# Patient Record
Sex: Male | Born: 1960 | Race: White | Hispanic: No | Marital: Married | Smoking: Former smoker
Health system: Southern US, Community
[De-identification: ages and names within clinical notes are randomized; demographics above are authoritative.]

## PROBLEM LIST (undated history)

## (undated) DIAGNOSIS — E291 Testicular hypofunction: Secondary | ICD-10-CM

## (undated) DIAGNOSIS — K219 Gastro-esophageal reflux disease without esophagitis: Secondary | ICD-10-CM

## (undated) DIAGNOSIS — Z8619 Personal history of other infectious and parasitic diseases: Secondary | ICD-10-CM

## (undated) DIAGNOSIS — E559 Vitamin D deficiency, unspecified: Secondary | ICD-10-CM

## (undated) DIAGNOSIS — I639 Cerebral infarction, unspecified: Secondary | ICD-10-CM

## (undated) DIAGNOSIS — E1140 Type 2 diabetes mellitus with diabetic neuropathy, unspecified: Secondary | ICD-10-CM

## (undated) DIAGNOSIS — E119 Type 2 diabetes mellitus without complications: Secondary | ICD-10-CM

## (undated) DIAGNOSIS — K5792 Diverticulitis of intestine, part unspecified, without perforation or abscess without bleeding: Secondary | ICD-10-CM

## (undated) DIAGNOSIS — Z87442 Personal history of urinary calculi: Secondary | ICD-10-CM

## (undated) DIAGNOSIS — E785 Hyperlipidemia, unspecified: Secondary | ICD-10-CM

## (undated) DIAGNOSIS — I10 Essential (primary) hypertension: Secondary | ICD-10-CM

## (undated) DIAGNOSIS — M869 Osteomyelitis, unspecified: Secondary | ICD-10-CM

## (undated) HISTORY — DX: Vitamin D deficiency, unspecified: E55.9

## (undated) HISTORY — PX: ORIF TIBIA FRACTURE: SHX5416

## (undated) HISTORY — DX: Osteomyelitis, unspecified: M86.9

## (undated) HISTORY — DX: Essential (primary) hypertension: I10

## (undated) HISTORY — PX: COLONOSCOPY: SHX174

## (undated) HISTORY — DX: Personal history of other infectious and parasitic diseases: Z86.19

## (undated) HISTORY — DX: Testicular hypofunction: E29.1

## (undated) HISTORY — DX: Personal history of urinary calculi: Z87.442

## (undated) HISTORY — DX: Cerebral infarction, unspecified: I63.9

## (undated) HISTORY — DX: Type 2 diabetes mellitus with diabetic neuropathy, unspecified: E11.40

## (undated) HISTORY — DX: Hyperlipidemia, unspecified: E78.5

## (undated) HISTORY — DX: Type 2 diabetes mellitus without complications: E11.9

## (undated) HISTORY — DX: Gastro-esophageal reflux disease without esophagitis: K21.9

---

## 1985-01-21 HISTORY — PX: CHOLECYSTECTOMY: SHX55

## 1997-09-05 ENCOUNTER — Ambulatory Visit (HOSPITAL_COMMUNITY): Admission: RE | Admit: 1997-09-05 | Discharge: 1997-09-05 | Payer: Self-pay | Admitting: Internal Medicine

## 1998-11-06 ENCOUNTER — Encounter: Payer: Self-pay | Admitting: Internal Medicine

## 1998-11-06 ENCOUNTER — Ambulatory Visit (HOSPITAL_COMMUNITY): Admission: RE | Admit: 1998-11-06 | Discharge: 1998-11-06 | Payer: Self-pay | Admitting: Internal Medicine

## 1999-05-08 ENCOUNTER — Encounter: Payer: Self-pay | Admitting: Internal Medicine

## 1999-05-08 ENCOUNTER — Ambulatory Visit (HOSPITAL_COMMUNITY): Admission: RE | Admit: 1999-05-08 | Discharge: 1999-05-08 | Payer: Self-pay | Admitting: Internal Medicine

## 2003-09-07 ENCOUNTER — Ambulatory Visit (HOSPITAL_COMMUNITY): Admission: RE | Admit: 2003-09-07 | Discharge: 2003-09-07 | Payer: Self-pay | Admitting: Internal Medicine

## 2004-04-24 ENCOUNTER — Ambulatory Visit: Payer: Self-pay | Admitting: Gastroenterology

## 2004-05-09 ENCOUNTER — Ambulatory Visit: Payer: Self-pay

## 2004-05-14 ENCOUNTER — Ambulatory Visit: Payer: Self-pay

## 2005-11-12 ENCOUNTER — Ambulatory Visit (HOSPITAL_COMMUNITY): Admission: RE | Admit: 2005-11-12 | Discharge: 2005-11-12 | Payer: Self-pay | Admitting: Internal Medicine

## 2006-12-25 ENCOUNTER — Ambulatory Visit (HOSPITAL_COMMUNITY): Admission: RE | Admit: 2006-12-25 | Discharge: 2006-12-25 | Payer: Self-pay | Admitting: Internal Medicine

## 2008-04-28 ENCOUNTER — Encounter: Admission: RE | Admit: 2008-04-28 | Discharge: 2008-04-28 | Payer: Self-pay | Admitting: Internal Medicine

## 2009-09-08 ENCOUNTER — Observation Stay (HOSPITAL_COMMUNITY): Admission: EM | Admit: 2009-09-08 | Discharge: 2009-09-11 | Payer: Self-pay | Admitting: Emergency Medicine

## 2010-03-27 ENCOUNTER — Other Ambulatory Visit (HOSPITAL_COMMUNITY): Payer: Self-pay | Admitting: Internal Medicine

## 2010-03-27 ENCOUNTER — Ambulatory Visit (HOSPITAL_COMMUNITY)
Admission: RE | Admit: 2010-03-27 | Discharge: 2010-03-27 | Disposition: A | Discharge status: Home / Self Care | Payer: 59 | Source: Ambulatory Visit | Attending: Internal Medicine | Admitting: Internal Medicine

## 2010-03-27 DIAGNOSIS — I10 Essential (primary) hypertension: Secondary | ICD-10-CM

## 2010-03-27 DIAGNOSIS — F17200 Nicotine dependence, unspecified, uncomplicated: Secondary | ICD-10-CM | POA: Insufficient documentation

## 2010-03-27 DIAGNOSIS — Z0000 Encounter for general adult medical examination without abnormal findings: Secondary | ICD-10-CM | POA: Insufficient documentation

## 2010-04-06 LAB — GLUCOSE, CAPILLARY
Glucose-Capillary: 108 mg/dL — ABNORMAL HIGH (ref 70–99)
Glucose-Capillary: 119 mg/dL — ABNORMAL HIGH (ref 70–99)
Glucose-Capillary: 120 mg/dL — ABNORMAL HIGH (ref 70–99)
Glucose-Capillary: 124 mg/dL — ABNORMAL HIGH (ref 70–99)
Glucose-Capillary: 134 mg/dL — ABNORMAL HIGH (ref 70–99)
Glucose-Capillary: 159 mg/dL — ABNORMAL HIGH (ref 70–99)
Glucose-Capillary: 162 mg/dL — ABNORMAL HIGH (ref 70–99)
Glucose-Capillary: 193 mg/dL — ABNORMAL HIGH (ref 70–99)
Glucose-Capillary: 201 mg/dL — ABNORMAL HIGH (ref 70–99)

## 2010-04-06 LAB — COMPREHENSIVE METABOLIC PANEL
AST: 121 U/L — ABNORMAL HIGH (ref 0–37)
AST: 55 U/L — ABNORMAL HIGH (ref 0–37)
Albumin: 2.9 g/dL — ABNORMAL LOW (ref 3.5–5.2)
Albumin: 3.8 g/dL (ref 3.5–5.2)
Alkaline Phosphatase: 75 U/L (ref 39–117)
Alkaline Phosphatase: 79 U/L (ref 39–117)
BUN: 14 mg/dL (ref 6–23)
BUN: 18 mg/dL (ref 6–23)
BUN: 22 mg/dL (ref 6–23)
BUN: 9 mg/dL (ref 6–23)
CO2: 26 mEq/L (ref 19–32)
CO2: 28 mEq/L (ref 19–32)
Calcium: 8.6 mg/dL (ref 8.4–10.5)
Chloride: 101 mEq/L (ref 96–112)
Chloride: 103 mEq/L (ref 96–112)
Creatinine, Ser: 0.72 mg/dL (ref 0.4–1.5)
Creatinine, Ser: 0.81 mg/dL (ref 0.4–1.5)
GFR calc Af Amer: >60 mL/min (ref >60)
GFR calc Af Amer: >60 mL/min (ref >60)
GFR calc non Af Amer: >60 mL/min (ref >60)
GFR calc non Af Amer: >60 mL/min (ref >60)
Glucose, Bld: 145 mg/dL — ABNORMAL HIGH (ref 70–99)
Potassium: 3.9 mEq/L (ref 3.5–5.1)
Potassium: 4.4 mEq/L (ref 3.5–5.1)
Sodium: 135 mEq/L (ref 135–145)
Total Bilirubin: 1.1 mg/dL (ref 0.3–1.2)
Total Bilirubin: 2 mg/dL — ABNORMAL HIGH (ref 0.3–1.2)
Total Protein: 6.6 g/dL (ref 6.0–8.3)
Total Protein: 7.8 g/dL (ref 6.0–8.3)

## 2010-04-06 LAB — CBC
HCT: 40.5 % (ref 39.0–52.0)
HCT: 40.6 % (ref 39.0–52.0)
Hemoglobin: 13.8 g/dL (ref 13.0–17.0)
Hemoglobin: 14 g/dL (ref 13.0–17.0)
MCH: 32.3 pg (ref 26.0–34.0)
MCH: 32.3 pg (ref 26.0–34.0)
MCV: 93.5 fL (ref 78.0–100.0)
MCV: 94.1 fL (ref 78.0–100.0)
MCV: 95.1 fL (ref 78.0–100.0)
Platelets: 132 10*3/uL — ABNORMAL LOW (ref 150–400)
Platelets: 186 10*3/uL (ref 150–400)
RBC: 4.27 MIL/uL (ref 4.22–5.81)
RBC: 4.33 MIL/uL (ref 4.22–5.81)
RBC: 4.71 MIL/uL (ref 4.22–5.81)
RDW: 13.1 % (ref 11.5–15.5)
WBC: 14.7 10*3/uL — ABNORMAL HIGH (ref 4.0–10.5)

## 2010-04-06 LAB — DIFFERENTIAL
Basophils Relative: 0 % (ref 0–1)
Eosinophils Relative: 1 % (ref 0–5)
Lymphocytes Relative: 31 % (ref 12–46)
Monocytes Absolute: 1.6 10*3/uL — ABNORMAL HIGH (ref 0.1–1.0)
Monocytes Relative: 11 % (ref 3–12)
Neutro Abs: 8.4 10*3/uL — ABNORMAL HIGH (ref 1.7–7.7)

## 2010-04-06 LAB — URINALYSIS, ROUTINE W REFLEX MICROSCOPIC
Protein, ur: 30 mg/dL — AB
Urobilinogen, UA: 1 mg/dL (ref 0.0–1.0)

## 2010-04-06 LAB — URINE MICROSCOPIC-ADD ON

## 2010-04-06 LAB — HEPATITIS PANEL, ACUTE
HCV Ab: REACTIVE — AB
Hep A IgM: NEGATIVE

## 2010-04-06 LAB — POCT CARDIAC MARKERS
CKMB, poc: <1 ng/mL — ABNORMAL LOW (ref 1.0–8.0)
Myoglobin, poc: 87.9 ng/mL (ref 12–200)

## 2010-06-28 ENCOUNTER — Other Ambulatory Visit: Payer: Self-pay | Admitting: Internal Medicine

## 2010-07-02 ENCOUNTER — Other Ambulatory Visit: Payer: 59

## 2010-07-19 ENCOUNTER — Ambulatory Visit (INDEPENDENT_AMBULATORY_CARE_PROVIDER_SITE_OTHER): Payer: 59 | Admitting: Gastroenterology

## 2010-07-19 ENCOUNTER — Other Ambulatory Visit: Payer: Self-pay | Admitting: Gastroenterology

## 2010-07-19 VITALS — BP 133/75 | HR 74 | Temp 97.7°F | Ht 74.0 in | Wt 296.0 lb

## 2010-07-19 DIAGNOSIS — B182 Chronic viral hepatitis C: Secondary | ICD-10-CM

## 2010-07-20 LAB — AFP TUMOR MARKER: AFP-Tumor Marker: 9.2 ng/mL — ABNORMAL HIGH (ref 0.0–8.0)

## 2010-07-20 LAB — CBC WITH DIFFERENTIAL/PLATELET
Basophils Absolute: 0.1 10*3/uL (ref 0.0–0.1)
Basophils Relative: 1 % (ref 0–1)
Eosinophils Absolute: 0.1 10*3/uL (ref 0.0–0.7)
Eosinophils Relative: 1 % (ref 0–5)
HCT: 46.4 % (ref 39.0–52.0)
Hemoglobin: 15.9 g/dL (ref 13.0–17.0)
MCH: 33.3 pg (ref 26.0–34.0)
MCHC: 34.3 g/dL (ref 30.0–36.0)
MCV: 97.1 fL (ref 78.0–100.0)
Monocytes Absolute: 0.9 10*3/uL (ref 0.1–1.0)
Monocytes Relative: 10 % (ref 3–12)
RDW: 13.5 % (ref 11.5–15.5)

## 2010-07-20 LAB — IRON AND TIBC
%SAT: 57 % — ABNORMAL HIGH (ref 20–55)
Iron: 200 ug/dL — ABNORMAL HIGH (ref 42–165)

## 2010-07-20 LAB — HEPATITIS A ANTIBODY, TOTAL: Hep A Total Ab: POSITIVE — AB

## 2010-07-20 LAB — ANA: Anti Nuclear Antibody(ANA): NEGATIVE

## 2010-07-26 NOTE — Progress Notes (Signed)
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   History:  The patient is a 50 year old gentleman who I have been asked to see in consultation by Dr. 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  It will be recalled that I first saw the patient on 04/24/2004, regarding his hepatitis C. At the time we discovered, he was genotype 2. On 06/12/2004, I discussed the results of the lab testing from his  initial visit with him. We discussed treatment at that time. He wanted to discuss this with his significant other and contact us with his decision. He never called [place] back. At this point, he reports that Dr.  Oneta Rack has sent him back to see [place] again, because he thinks his liver tests are somewhat more elevated although I do not have any documentation as to if there is a specific concern. There are  currently no symptoms referable to his history of genotype 2 hepatitis C. There are no symptoms to suggest cryoglobulin mediated or decompensated liver disease.  With respect to risk factors for liver disease, he drinks approximately 1-2 drinks per year since last being seen. He smokes marijuana on 1-2 occasions per week. He denies any history of intravenous or intranasal drug use since last being seen, but over 25 years ago he used drugs intravenously. There is a history of tattoos but no history of transfusion or unsterile body piercing.  There is no family history of liver disease. He was previously found to be hepatitis B immune, but A nave and did not get vaccinated.    PAST MEDICAL HISTORY:  Significant for hypertriglyceridemia and hypertension. He also now has a history of type 2 diabetes.  He measures his blood sugars only 2-3 times a week when he feels unwell, thinking his blood sugars may be  low. In a handwritten note from 09/27/2009, hemoglobin A1c around that time was 7.4% and possibly 8 on 08/07/2009, and around mid August in hospital for diverticulitis, it was 8.2%. He also was hospitalized  between 09/07/2009 and 09/11/2009, for an episode of "diverticulitis."  CT abdomen and pelvis with contrast during this admission on  09/07/2009, showed the liver was unremarkable in appearance, and there was no evidence of portal hypertension.   PAST SURGICAL HISTORY:  There has been no surgery since last being seen in 2006 but is significant for cholecystectomy 1988, and 3 surgeries for a fractured leg.   Past psychiatric history:  He denies.   CURRENT MEDICATIONS:  Hydrochlorothiazide 25 mg p.o. daily, glyburide 10 mg p.o. daily, metformin 1000 mg p.o. b.i.d., Benicar 20 mg p.o. daily, citalopram 40  mg p.o. daily, fish oil 1000 mg b.i.d., vitamin D three 10,000 units daily, milk thistle 1000 mg daily.    allergies:  Denies.   habits:  Smoking quit recently.  Alcohol as above.   FAMILY HISTORY:  As above.   SOCIAL HISTORY:  He is a Retail banker. He has a common-law wife with no children.   REVIEW OF SYSTEMS:  All 10 systems reviewed today with the patient on the review of systems form, which was signed and placed in the chart. CES-D was  12.   PHYSICAL EXAMINATION:   Constitutional:  Central obesity, but otherwise well appearing. Vital signs: Height 74 inches, weight 296 pounds, which is up from 267 pounds previously on 04/24/2004, blood pressure 133/75, pulse of 74,  temperature 97.7 Fahrenheit.  Ears, nose, mouth and throat:  Unremarkable oropharynx.  No thyromegaly or neck masses.  Chest:  Resonant to percussion.  Clear to auscultation.  Cardiovascular:   Heart sounds normal S1, S2 without murmurs or rubs.  There is no peripheral edema.  Abdominal:  Normal  bowel sounds.  No masses or tenderness.  I could not appreciate a liver edge or spleen tip.  I  could not appreciate any hernias.  Lymphatics:  No cervical or inguinal lymphadenopathy.  Central Nervous System:  No asterixis or focal neurologic findings.  Dermatologic:  Anicteric without palmar  erythema or spider angiomata.  Eyes:  Anicteric sclerae.  Pupils are equal and reactive to light.   Laboratories:  Most recent labs 03/27/2010; Hemoglobin A1c was 9.6%, hepatitis C RNA was 3,090,000 international units per mL. His genotype was not reported. CBC was unremarkable with a platelet count of 179,  creatinine was 0.77, triglycerides were 388.  His total bilirubin 0.6, albumin was 4.2, AST 57, ALT 99, ALP 73, globulins 2.9, TSH 2.07.   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  A biopsy was not previously  indicated. My concerns over treating him are that of his poorly- controlled diabetes and his hypertriglyceridemia. These are not absolute contraindication to treating him, however.  I discussed treatment with pegylated interferon and ribavirin with him. I have explained our treatment protocol and the response rate for this. We discussed the fact that the interferon could worsen  diabetes control, which in turn can impact on his response rates to treatment. I reviewed the specific system, constitutional, psychiatric side effects of interferon and ribavirin.  The patient was interested in pursuing treatment.   PLAN:  1. Will recheck his genotype to be certain it has not changed. 2. Will check his hepatitis A antibody as previously he was negative but had not been immunized. 3. Otherwise, standard labs. 4. I will start the prior authorization process for his insurance with [employer]. 5. 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            Brooke Dare, MD    ADDENDUM: Hep A immune.  Previously ferritin and iron saturation not significant but now 1037 and 57% respectively.  Will check HFE gene and if negative but ferritin still over 1000, may need a biopsy with HII.  Genotype 2.  I have initiated the prior authorization process with [employer]  Case ID for Pegasys will be [id number] and for  Ribavirin will [id number].  [phone]  D:  Thu Jun 28 18:40:52 2012 ; T:  Thu Jun 28 21:21:27 2012  Job #:  [id number]

## 2010-08-30 ENCOUNTER — Telehealth: Payer: Self-pay | Admitting: Gastroenterology

## 2010-08-30 NOTE — Telephone Encounter (Signed)
I called [facility] at [phone]) and confirmed that they have filled his Peg/RBV and have contacted the patient to ship meds but he has not replied.  I called [name] and left a message asking him to contact [facility] and [place] to receive his meds and arrange for a follow up appt.

## 2010-10-04 ENCOUNTER — Ambulatory Visit (INDEPENDENT_AMBULATORY_CARE_PROVIDER_SITE_OTHER): Payer: 59 | Admitting: Gastroenterology

## 2010-10-04 VITALS — BP 150/82 | HR 76 | Temp 98.2°F | Ht 74.0 in | Wt 299.0 lb

## 2010-10-04 DIAGNOSIS — B182 Chronic viral hepatitis C: Secondary | ICD-10-CM

## 2010-10-05 LAB — HEPATITIS C RNA QUANTITATIVE
HCV Quantitative Log: 6.47 {Log} — ABNORMAL HIGH (ref <1.63)
HCV Quantitative: 2920000 IU/mL — ABNORMAL HIGH (ref <43)

## 2010-10-18 ENCOUNTER — Ambulatory Visit (INDEPENDENT_AMBULATORY_CARE_PROVIDER_SITE_OTHER): Payer: 59 | Admitting: Gastroenterology

## 2010-10-18 DIAGNOSIS — B182 Chronic viral hepatitis C: Secondary | ICD-10-CM

## 2010-10-18 NOTE — Progress Notes (Signed)
   NAME:  [name], [name]  MR#:  [id number]      DATE:  10/04/2010  DOB:  08-Aug-1960    cc: Referring Physician:  Marinus Maw, MD, [facility], [address], [address], [place], [place] [address], Fax [phone]    REASON FOR VISIT:  Follow up of genotype 2 hepatitis C prior to commencement of treatment.    History:  The patient returns today unaccompanied. Since last being seen on 07/19/2010, he has taken delivery of his interferon and ribavirin to start on treatment. He intends to start tomorrow with his first  injection of interferon and dose of ribavirin. There have been no interval development of symptoms or symptoms to suggest cryoglobulin mediated or decompensated liver disease.   PAST MEDICAL HISTORY:  Significant for type 2 diabetes.   LABORATORY STUDIES:  From 10/02/2010, show hemoglobin A1c of 9.3%. In addition, he has a history of hypertriglyceridemia and the triglycerides were 487 on 10/02/2010.   CURRENT MEDICATIONS:  Hydrochlorothiazide 25 mg p.o. daily, glyburide 10 mg p.o. daily, metformin 1000 mg p.o. b.i.d., Benicar 20 mg p.o. daily, citalopram 40  mg p.o. daily, fish oil 1000 mg p.o. b.i.d., vitamin D 10,000 units daily, milk thistle 1000 mg p.o. daily.    Allergies:  Denies.    HABITS:  Smoking, recently resumed smoking. Alcohol, denies interval consumption.   REVIEW OF SYSTEMS:  All 10 systems reviewed today on with the patient and they are negative other than which was mentioned above. CES-D was 11.   PHYSICAL EXAMINATION:   Constitutional:  Well appearing but does have central obesity. Vital signs: Height 74 inches, weight 299 pounds up 3 pounds from  previously. Blood pressure 150/82, pulse 76, temperature 98.2 Fahrenheit.   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 I do not think a biopsy is necessary as part of his  pretreatment. Although his blood sugars have been  running high of late as evidenced by his hemoglobin A1c and his triglycerides are elevated, I think it is reasonable to give him an attempt at therapy rather than waiting any longer.  Today, I showed the patient how to do a self injection of interferon. We discussed dosing regimen and mitigated some side effects. I also discussed dosing of ribavirin. I have discussed the interaction  between poorly-controlled diabetes and interferon and how this may worsen his diabetes, as well as diminish his response rates. The  patient already has tried, in response to his last A1c, has started to implement changes to diet to improve his A1c.   PLAN:  1. Baseline HCV RNA today. 2. He is hepatitis A immune. 3. Previously reported to be hepatitis B immune. Nevertheless, will check total B core antibody and hepatitis B surface antibody today. 4. His start date for treatment will be 10/05/2010. 5. He is to return in 2 weeks' time for his week 2 visit. At that time we will draw an HFE gene as previously documented in his 07/19/2010 notes. 6. I drew his baseline HCV RNA today.            Brooke Dare, MD   [phone]  D:  Thu Sep 13 13:16:10 2012 ; T:  Thu Sep 13 13:53:43 2012  Job #:  [id number]

## 2010-10-19 LAB — CBC WITH DIFFERENTIAL/PLATELET
Basophils Absolute: 0 10*3/uL (ref 0.0–0.1)
Eosinophils Absolute: 0 10*3/uL (ref 0.0–0.7)
Eosinophils Relative: 1 % (ref 0–5)
HCT: 38.2 % — ABNORMAL LOW (ref 39.0–52.0)
Lymphocytes Relative: 59 % — ABNORMAL HIGH (ref 12–46)
Lymphs Abs: 3.1 10*3/uL (ref 0.7–4.0)
MCH: 33.3 pg (ref 26.0–34.0)
MCV: 97.9 fL (ref 78.0–100.0)
Monocytes Absolute: 0.7 10*3/uL (ref 0.1–1.0)
Platelets: 143 10*3/uL — ABNORMAL LOW (ref 150–400)
RDW: 14 % (ref 11.5–15.5)
WBC: 5.3 10*3/uL (ref 4.0–10.5)

## 2010-10-25 NOTE — Progress Notes (Signed)
   NAME:  [name], [name]  MR#:  [id number]      DATE:  10/18/2010  DOB:  [birth year]    cc: Referring Physician:  Marinus Maw, MD, [facility], [address], [place], [place] [address], Fax [phone]    REASON FOR VISIT:  Followup of genotype 2 hepatitis C at week 2 of therapy.    History:  The patient returns today unaccompanied. His start date for treatment was 10/05/2010. He will take his third dose of pegylated interferon tomorrow on 10/19/2010. After his first injection he had some nausea  and thought he had some viral infection like symptoms. However this dissipated within the first week. After second injection of  interferon, he has felt well. He has no skin rash either. There is no significant irritability.   PAST MEDICAL HISTORY:  Significant for type 2 diabetes. He notes he has lost 3 pounds since last being seen by me and reports his diabetes is better. For example,  he reports today's 2 hour post breakfast blood sugar was 170, whereas typically it was over 200 in the past.   CURRENT MEDICATIONS:  Hydrochlorothiazide 25 mg p.o. daily, glyburide 10 mg p.o. daily, metformin 1000 mg p.o. b.i.d., Benicar 20 mg p.o. daily, citalopram 40 mg p.o. daily, fish oil 1000 mg p.o. b.i.d., vitamin D 10,000 units  p.o. daily, milk thistle 1000 mg p.o. daily, Pegasys 180 mcg subcu weekly, ribavirin 400 mg p.o. b.i.d.    Allergies:  Denies.   Habits:  Smoking still approximately a pack cigarettes per day but thinks he may reduce his smoking because he has lost the taste for this on interferon. Alcohol, denies interval consumption.   REVIEW OF SYSTEMS:  All 10 systems reviewed today with the patient and they are negative other than which was mentioned above. CES-D was 6.   PHYSICAL EXAMINATION:   Constitutional:  Well appearing with central obesity. Vital signs: Height 74 inches, weight 296 pounds, blood pressure  135/77, pulse of 81, temperature 97.8 Fahrenheit.   Laboratories:  His pretreatment viral load was 2,920,000 international units per mL. He was hepatitis B surface antibody positive, as well as previously total A antibody positive.   ASSESSMENT:  The patient is a 50 year old gentleman with history of genotype 2 hepatitis C. He is currently week 2 of therapy having started on 10/05/2010. He is tolerating this well.  In my discussion today with the patient, we discussed his progress to date. I have explained the significance of the lab testing especially today and his week 4 HCV RNA in 2 weeks' time.   plan:  1. Hepatitis A and B immune. 2. CBC today. 3. Return in 2 weeks' time for week 4 HCV RNA and a CBC and liver enzymes.            Brooke Dare, MD   [phone]  D:  Thu Sep 27 17:59:02 2012 ; T:  Thu Sep 27 18:52:13 2012  Job #:  [id number]

## 2010-11-01 ENCOUNTER — Emergency Department (HOSPITAL_COMMUNITY)
Admission: EM | Admit: 2010-11-01 | Discharge: 2010-11-01 | Disposition: A | Discharge status: Home / Self Care | Payer: 59 | Attending: Emergency Medicine | Admitting: Emergency Medicine

## 2010-11-01 ENCOUNTER — Ambulatory Visit (INDEPENDENT_AMBULATORY_CARE_PROVIDER_SITE_OTHER): Payer: 59 | Admitting: Gastroenterology

## 2010-11-01 ENCOUNTER — Emergency Department (HOSPITAL_COMMUNITY): Payer: 59

## 2010-11-01 DIAGNOSIS — R109 Unspecified abdominal pain: Secondary | ICD-10-CM | POA: Insufficient documentation

## 2010-11-01 DIAGNOSIS — R112 Nausea with vomiting, unspecified: Secondary | ICD-10-CM | POA: Insufficient documentation

## 2010-11-01 DIAGNOSIS — B182 Chronic viral hepatitis C: Secondary | ICD-10-CM

## 2010-11-01 DIAGNOSIS — E119 Type 2 diabetes mellitus without complications: Secondary | ICD-10-CM | POA: Insufficient documentation

## 2010-11-01 DIAGNOSIS — I10 Essential (primary) hypertension: Secondary | ICD-10-CM | POA: Insufficient documentation

## 2010-11-01 DIAGNOSIS — N1330 Unspecified hydronephrosis: Secondary | ICD-10-CM | POA: Insufficient documentation

## 2010-11-01 DIAGNOSIS — Z79899 Other long term (current) drug therapy: Secondary | ICD-10-CM | POA: Insufficient documentation

## 2010-11-01 LAB — CBC
HCT: 36 % — ABNORMAL LOW (ref 39.0–52.0)
Hemoglobin: 12.5 g/dL — ABNORMAL LOW (ref 13.0–17.0)
MCH: 32.9 pg (ref 26.0–34.0)
MCHC: 34.7 g/dL (ref 30.0–36.0)
MCV: 94.7 fL (ref 78.0–100.0)
RDW: 14.9 % (ref 11.5–15.5)

## 2010-11-01 LAB — DIFFERENTIAL
Eosinophils Relative: 0 % (ref 0–5)
Lymphocytes Relative: 24 % (ref 12–46)
Lymphs Abs: 1.6 10*3/uL (ref 0.7–4.0)
Monocytes Absolute: 0.4 10*3/uL (ref 0.1–1.0)
Monocytes Relative: 7 % (ref 3–12)
Neutro Abs: 4.7 10*3/uL (ref 1.7–7.7)

## 2010-11-01 LAB — URINALYSIS, ROUTINE W REFLEX MICROSCOPIC
Glucose, UA: NEGATIVE mg/dL
Ketones, ur: NEGATIVE mg/dL
Protein, ur: NEGATIVE mg/dL

## 2010-11-01 LAB — BASIC METABOLIC PANEL
BUN: 29 mg/dL — ABNORMAL HIGH (ref 6–23)
CO2: 23 mEq/L (ref 19–32)
Glucose, Bld: 132 mg/dL — ABNORMAL HIGH (ref 70–99)
Potassium: 4.2 mEq/L (ref 3.5–5.1)
Sodium: 134 mEq/L — ABNORMAL LOW (ref 135–145)

## 2010-11-01 LAB — URINE MICROSCOPIC-ADD ON

## 2010-11-09 NOTE — Progress Notes (Signed)
NAME:  [name], [name]  MR#:  [id number]      DATE:  11/01/2010  DOB:  1960/03/23    cc: Referring Physician:  Marinus Maw, MD, [facility], [address], [place], [place] [address], Fax [phone]    REASON FOR VISIT:  Follow up of genotype 2 hepatitis C at week 4 of therapy.   History:  The patient returns today unaccompanied. His start date for treatment was 10/05/2010. He will take is fifth dose tomorrow on 11/02/2010. He is tolerating therapy quite well.  However, today his major complaint is that of the development of left flank pain. He woke up this morning at 3 a.m. with left flank pain and it has persisted over the course of the day. This radiated down to the left groin. There is associated nausea and anorexia but no fever. There is no hematuria or change with bowel movements. He took one 7.5 mg Percocet this morning with temporary relief but currently he is having significant pain. He has no history of nephrolithiasis.   PAST MEDICAL HISTORY:  Significant for type 2 diabetes. He reports he was able to stop his glyburide because of episodes of hypoglycemia. He has lost 6 pounds since his last clinic appointment.   CURRENT MEDICATIONS:  Hydrochlorothiazide 25 mg p.o. daily, metformin 1000 mg p.o. b.i.d., Benicar 20 mg p.o. daily, citalopram 40 mg p.o. daily, fish oil 1000  mg p.o. b.i.d., vitamin D 10,000 units p.o. daily, Pegasys 180 mcg subcu weekly, ribavirin 400 mg p.o. b.i.d.   ALLERGIES:  Denies.   HABITS:  Still smoking approximately a pack of cigarettes per day. Alcohol denies interval consumption.   REVIEW OF SYSTEMS:  All 10 systems reviewed today with the patient and they are significant for the left flank pain as previously mentioned. Otherwise negative. His CES-D was 14.   PHYSICAL EXAMINATION:   Constitutional:  Appeared in moderate distress. There is central obesity. Vital signs: Height 74  inches, weight 290 pounds, blood pressure 133/88, pulse of 88, temperature 97.8 Fahrenheit.  Ears,  nose, mouth, and throat: Unremarkable oropharynx. No thyromegaly or neck masses. Chest: Resonant to percussion. Clear to auscultation.  Cardiovascular:  Normal S1, S2 without murmurs or rubs. There is no peripheral edema.  Abdominal:  Normal bowel sounds. There are no masses. There is tenderness in left lower quadrant with no rebound or guarding. The tenderness is most pronounced towards the left flank. I could not  appreciate liver edge or spleen tip. There was left CVA tenderness. Lymphatics: No cervical or inguinal lymphadenopathy. Central nervous system: No asterixis or focal findings.  Dermatologic:  Anicteric. No palmar erythema.   Eyes:  Anicteric sclera. Pupils equal, active to light.   ASSESSMENT:  A 50 year old gentleman with a history of genotype 2 hepatitis C who is currently completed 4 weeks of therapy with his fifth week of interferon and ribavirin to start tomorrow. His pretreatment viral  load was 2,920,000 international units per mL. He is due for his week 4 HCV RNA today.   However, it appears that he has developed left renal colic possibly from a left renal stone. I think he would best be  managed in the emergency room for this. I do not think this is related to his hepatitis C therapy and he could continue therapy through whatever happens to him.  Today, I discussed it is possible that the patient has left renal colic and I advised him to  go to the emergency room. To that end, I also called the ER and spoke to the ER physician at [facility]  regarding him. I have asked the patient to be sure that they drawn an HCV RNA if they are drawing any lab work tonight.   Plan:  1. Hepatitis A and B immune. 2. CBC will be done in the emergency room as part of his labs. 3. His week 4 HCV RNA is due today. If he does not get this done in the ER tonight, I have given him a requisition to  take to [facility]. 4. He is to return in 2 weeks' time for his week 6 visit.            Brooke Dare, MD   ADDENDUM Week 4 HCV RNA 104 IU/mL  pretreatment viral  load was 2,920,000 international units per mL  [phone]  D:  Thu Oct 11 18:54:33 2012 ; T:  Thu Oct 11 20:36:24 2012  Job #:  [id number]

## 2010-11-15 ENCOUNTER — Ambulatory Visit (INDEPENDENT_AMBULATORY_CARE_PROVIDER_SITE_OTHER): Payer: 59 | Admitting: Gastroenterology

## 2010-11-15 DIAGNOSIS — B182 Chronic viral hepatitis C: Secondary | ICD-10-CM

## 2010-11-16 LAB — CBC WITH DIFFERENTIAL/PLATELET
Basophils Absolute: 0 10*3/uL (ref 0.0–0.1)
Eosinophils Absolute: 0 10*3/uL (ref 0.0–0.7)
Eosinophils Relative: 0 % (ref 0–5)
Lymphocytes Relative: 49 % — ABNORMAL HIGH (ref 12–46)
MCV: 102.7 fL — ABNORMAL HIGH (ref 78.0–100.0)
Neutrophils Relative %: 39 % — ABNORMAL LOW (ref 43–77)
Platelets: 141 10*3/uL — ABNORMAL LOW (ref 150–400)
RDW: 15.8 % — ABNORMAL HIGH (ref 11.5–15.5)
WBC: 5.5 10*3/uL (ref 4.0–10.5)

## 2010-11-19 LAB — HEPATITIS C RNA QUANTITATIVE

## 2010-11-22 NOTE — Progress Notes (Signed)
NAME:  [name], [name]  MR#:  [id number]      DATE:  11/15/2010  DOB:  May 07, 1960    cc: Referring Physician:  Andria Meuse, MD, [facility], [address], [place], [place] [address], Fax [phone]    REASON FOR VISIT:  Followup genotype 2 HCV at week 6 of therapy.   History:  The patient returns today unaccompanied. His start date for therapy was 10/05/2010. He will take the sixth dose of interferon tomorrow on 11/16/2010. He is tolerating therapy quite well. There are no  significant depressive symptoms. He complains of some mild nausea but is not troubled by it. There is no rash.   Past medical history:  It will be recalled that when he was seen in clinic on 11/01/2010, he had significant left flank pain. I sent him to the emergency room. He was also diagnosed with a kidney stone.  The report of the CT scan  indicated that the stone had passed. Although he was given followup appoint with Urology, he does not see the need to go because he is now feeling very well.  Otherwise, he is a type 2 diabetic.   CURRENT MEDICATIONS:  Hydrochlorothiazide 25 mg p.o. daily, metformin 1000 mg p.o. b.i.d., Benicar 20 mg p.o. daily, citalopram 40 mg p.o. daily, fish oil 1000  mg p.o. b.i.d., vitamin D 10,000 units p.o. daily, Pegasys 180 mcg subcu weekly, ribavirin 400 mg p.o. b.i.d.   ALLERGIES:  Denies.   HABITS:  Still smokes approximately half pack of cigarettes per day. Alcohol, denies interval alcohol consumption.   REVIEW OF SYSTEMS:  All 10 systems reviewed today with the patient and they are negative other than which was mentioned above. CES-D was 11.   PHYSICAL EXAMINATION:   Constitutional:  Well appearing with central obesity. Vital signs: Height 74 inches, weight 257 pounds, which is down 3 pounds from  previous, blood pressure 155/86, pulse of 80, temperature 98 Fahrenheit.   LABORATORY STUDIES:  From 11/01/2010,  white count 6.7, hemoglobin 12.5, MCV 94.7, platelet count 138, ANC 4.7.   ASSESSMENT:  The patient is a 50 year old gentleman with history of genotype 2 hepatitis C, who completed 5 weeks of therapy with the sixth week of interferon and ribavirin to start tomorrow. His pretreatment viral load was 2,920,000 international units per mL. Week 4 HCV RNA had fallen to 104 international units per mL. The value of extending therapy from 24 to 48 weeks in a patient who did not achieve a complete rapid virologic response is unknown. In addition if he is not tolerating therapy well, it would not be worth continuing. Finally, if his insurer will not approve longer duration of therapy there will be little I could probably do about that. Finally with the anticipation of new medications to treat hepatitis C beyond genotype 1 patients, he may stand to benefit even if he did not respond to treatment now.  In my discussion today with the patient, we discussed his week 4 HCV RNA and its implications. We discussed possibly needing to extend therapy versus not.   PLAN:  1. He is hepatitis A and B immune. 2. CBC today, as well as HCV RNA for week 6. 3. To return in 2 weeks' time for his week 8 visit.            Brooke Dare, MD   ADDENUDM:  HCV RNA undetectable at week 6.  [phone]  D:  Thu Oct 25 16:10:59 2012 ; T:  Thu Oct 25 17:52:54 2012  Job #:  [id number]

## 2010-11-29 ENCOUNTER — Ambulatory Visit (INDEPENDENT_AMBULATORY_CARE_PROVIDER_SITE_OTHER): Payer: 59 | Admitting: Gastroenterology

## 2010-11-29 DIAGNOSIS — B182 Chronic viral hepatitis C: Secondary | ICD-10-CM

## 2010-11-30 LAB — CBC WITH DIFFERENTIAL/PLATELET
Basophils Absolute: 0 10*3/uL (ref 0.0–0.1)
Basophils Relative: 0 % (ref 0–1)
Hemoglobin: 13.6 g/dL (ref 13.0–17.0)
MCHC: 32.1 g/dL (ref 30.0–36.0)
Neutro Abs: 2 10*3/uL (ref 1.7–7.7)
Neutrophils Relative %: 39 % — ABNORMAL LOW (ref 43–77)
RDW: 15.8 % — ABNORMAL HIGH (ref 11.5–15.5)

## 2010-11-30 LAB — TSH: TSH: 1.966 u[IU]/mL (ref 0.350–4.500)

## 2010-11-30 LAB — HEPATIC FUNCTION PANEL
Albumin: 4.2 g/dL (ref 3.5–5.2)
Total Bilirubin: 0.8 mg/dL (ref 0.3–1.2)

## 2010-12-04 LAB — HEPATITIS C RNA QUANTITATIVE

## 2010-12-06 NOTE — Progress Notes (Signed)
NAME:  [name], [name]  MR#:  [id number]      DATE:  11/29/2010  DOB:  [birth year]    cc: Referring Physician:  Sherran Needs, MD, [facility], [address], [address], [place], [place] [address], Fax [phone]    reason for visit:  Genotype 2 hepatitis C on treatment week 8.    history:  The patient returns today unaccompanied. He is doing well. His start date for therapy was 10/05/2010. He is tolerating therapy well. He has no complaints referable to treatment at this time. He denies any  depression. The nausea that he had in the past, is better, which he now attributes to a viral infection. There is no rash.   Past medical history:  There is no residual effect of the left-sided nephrolithiasis. He did not see the urologist as he is now asymptomatic. He is a type 2 diabetic and continues to monitor his blood sugars.   CURRENT MEDICATIONS:  Hydrochlorothiazide 25 mg p.o. daily, metformin 1000 mg p.o. b.i.d., citalopram 40 mg p.o. daily, fish oil 1000 mg p.o. b.i.d., vitamin D  10,000 units p.o. daily, Pegasys 180 mcg subcu weekly, ribavirin 400 mg p.o. b.i.d.    Allergies:  Denies.   HABITS:  Still smokes approximately half a pack of cigarettes per day. Alcohol denies interval consumption.   REVIEW OF SYSTEMS:  All 10 systems reviewed today with the patient and they are negative other than which is mentioned above. CES-D was 8.   Physical examination:   Constitutional:  Well appearing. He has central obesity. Vital signs: Height 74 inches, weight 287 pounds up 30 pounds from before leading me to believe that there is some mistake in the recording of weight  compared the previous, blood pressure was 153/96, pulse of 82, temperature 98.1 Fahrenheit.   Laboratories:  Week 6 HCV RNA was undetectable and his week 4 HCV RNA was 104 international units per mL down from 2,920,000 international units per mL pretreatment.    ASSESSMENT:  The patient is a 50 year old gentleman with a history of genotype 2 hepatitis C who has now completed 8 weeks of therapy with pegylated interferon and ribavirin. His pretreatment viral load was  2,920,000 international units per mL, declining to 104,000 international units per mL on week 4, and undetectable on week 6. Again, it is unclear as to whether a slow responder will benefit from  extending therapy beyond 6 months for genotype 2 patient's, but I will inquire of his insurance company once I have today's HCV RNA viral load back. They had previously sent me a form asking for viral  kinetics with week 8 result, which I will draw today, and then send back to them with the question as to whether they will allow longer therapy. Given the fact that his HCV RNA was negative week 6, he would  need to go to 42 weeks, 36 weeks beyond last negative viral load.  Today, I discussed the possibility of extending therapy with the patient. He stated he is tolerating this well and does not see a problem with doing so if he has to. I have explained to him that is  not standard of care. This may be denied by his insurance company, but we will see what happens. He was in favor of attempting a longer duration of therapy if I thought it was necessary.   plan:  1. Hepatitis A and B immune. 2. CBC today with TSH  and liver enzymes, and another HCV RNA for week 8. 3. To return in 4 weeks' time. 4. When the results were available in a week's time or so, I will complete the forms for [employer] and ask them at the time whether we can extend therapy.            Brooke Dare, MD   ADDENDUM   No detectable level of HCV RNA at week 6.  [phone]  D:  Thu Nov 08 18:08:35 2012 ; T:  Thu Nov 08 20:01:12 2012  Job #:  [id number]

## 2010-12-20 ENCOUNTER — Ambulatory Visit: Payer: 59 | Admitting: Gastroenterology

## 2010-12-27 ENCOUNTER — Ambulatory Visit (INDEPENDENT_AMBULATORY_CARE_PROVIDER_SITE_OTHER): Payer: 59 | Admitting: Gastroenterology

## 2010-12-27 DIAGNOSIS — B182 Chronic viral hepatitis C: Secondary | ICD-10-CM

## 2010-12-27 LAB — CBC WITH DIFFERENTIAL/PLATELET
Basophils Absolute: 0 10*3/uL (ref 0.0–0.1)
Lymphocytes Relative: 41 % (ref 12–46)
Lymphs Abs: 2.1 10*3/uL (ref 0.7–4.0)
MCV: 102.4 fL — ABNORMAL HIGH (ref 78.0–100.0)
Neutro Abs: 2.4 10*3/uL (ref 1.7–7.7)
Neutrophils Relative %: 48 % (ref 43–77)
Platelets: 132 10*3/uL — ABNORMAL LOW (ref 150–400)
RBC: 4.12 MIL/uL — ABNORMAL LOW (ref 4.22–5.81)
RDW: 14.5 % (ref 11.5–15.5)
WBC: 5.1 10*3/uL (ref 4.0–10.5)

## 2010-12-27 LAB — HEPATIC FUNCTION PANEL
Alkaline Phosphatase: 72 U/L (ref 39–117)
Bilirubin, Direct: 0.1 mg/dL (ref 0.0–0.3)
Indirect Bilirubin: 0.6 mg/dL (ref 0.0–0.9)
Total Bilirubin: 0.7 mg/dL (ref 0.3–1.2)
Total Protein: 7.1 g/dL (ref 6.0–8.3)

## 2011-01-03 NOTE — Progress Notes (Signed)
   NAME:  [name], [name]  MR#:  [id number]      DATE:  12/27/2010  DOB:  12/05/1960    cc: Referring Physician:  Sherran Needs, MD, [facility], [address], [place], [place] [address], Fax [phone]    REASON FOR VISIT:  Follow up genotype 2 HCV on treatment week 12-13.    History:  The patient returns unaccompanied. He is doing well except for some nausea. It will be recalled that start date was 10/05/2010. His week 4 HCV RNA was positive. His week 6 was undetectable. As part of his  renewal for his authorization for his pegylated interferon and ribavirin, I included a note asking if his insurer would approve prolonging therapy to 42 weeks because of his positive viral load at  week 4. At week 4, I never heard back from them so I will assume that in the absence of any reply they would agree to extend therapy. The patient reports he has not heard anything from his insurer either.   PAST MEDICAL HISTORY:  No interval change.   CURRENT MEDICATIONS:  Hydrochlorothiazide 25 mg p.o. daily, metformin 1000 mg b.i.d., citalopram 40 mg daily, fish oil 1000 mg b.i.d., vitamin D 10,000 units daily, Pegasys 180 mcg weekly, ribavirin 400 mg b.i.d.    Allergies:  Denies.   HABITS:  Still smoking half pack of cigarettes per day. Alcohol denies interval consumption.   REVIEW OF SYSTEMS:  All 10 systems reviewed today with the patient and they are negative other than which is mentioned above. CES-D was 13.   PHYSICAL EXAMINATION:   Constitutional:  Well appearing without significant peripheral wasting. Vital signs: Height 74 inches, weight 284 pounds down 3  pounds from previous.  Blood pressure 168/96, pulse of 81, temperature 97.4 Fahrenheit.   ASSESSMENT:  The patient is a 50 year old gentleman with history of genotype 2 hepatitis C, who has now completed 12 weeks of therapy with pegylated interferon and ribavirin with  week 13 injection to start tomorrow  having started on 10/05/2010. His pretreatment viral load was 2,920,000 international units per mL, declining 104 at week 4, and undetectable week 6. As before I will try to treat him for prolonged  therapy because of his slow response by week 4 although the evidence in genotype 2 is not as strong as genotype 3.  Today we discussed his current progress. I have told him, I mean that I have asked his insurer to ask about prolonged therapy to which I have not received a reply.   plan:  1. Hepatitis A and B immune. 2. CBC, liver enzymes today. 3. Return in 4 weeks' time.            Brooke Dare, MD   [phone]  D:  Thu Dec 06 17:45:55 2012 ; T:  Thu Dec 06 19:24:55 2012  Job #:  [id number]

## 2011-01-24 ENCOUNTER — Ambulatory Visit (INDEPENDENT_AMBULATORY_CARE_PROVIDER_SITE_OTHER): Payer: 59 | Admitting: Gastroenterology

## 2011-01-24 DIAGNOSIS — B182 Chronic viral hepatitis C: Secondary | ICD-10-CM

## 2011-01-24 LAB — HEPATIC FUNCTION PANEL
Indirect Bilirubin: 0.5 mg/dL (ref 0.0–0.9)
Total Bilirubin: 0.6 mg/dL (ref 0.3–1.2)
Total Protein: 6.9 g/dL (ref 6.0–8.3)

## 2011-01-24 LAB — CBC WITH DIFFERENTIAL/PLATELET
Eosinophils Absolute: 0 10*3/uL (ref 0.0–0.7)
HCT: 44.7 % (ref 39.0–52.0)
Hemoglobin: 14.6 g/dL (ref 13.0–17.0)
Lymphs Abs: 1.4 10*3/uL (ref 0.7–4.0)
MCH: 33 pg (ref 26.0–34.0)
MCHC: 32.7 g/dL (ref 30.0–36.0)
Monocytes Absolute: 0.8 10*3/uL (ref 0.1–1.0)
Monocytes Relative: 13 % — ABNORMAL HIGH (ref 3–12)
Neutrophils Relative %: 66 % (ref 43–77)
RBC: 4.42 MIL/uL (ref 4.22–5.81)

## 2011-01-24 NOTE — Progress Notes (Addendum)
  cc: Referring Physician:  Andria Meuse, MD, [facility], [address], [address], [place], [place] [address], Fax [phone]    REASON FOR VISIT:  Follow up genotype 2 HCV on treatment week 16.    History:  The patient returns unaccompanied. It will be recalled that start date was 10/05/2010. His week 4 HCV RNA was positive. His week 6 was undetectable. As part of his  renewal for his authorization for his pegylated interferon and ribavirin, I included a note asking if his insurer would approve prolonging therapy to 42 weeks because of his positive viral load at  week 4.  I asked again in a fax to his insurer with his viral loads on 01/03/11, but again never heard back. The patient reports he has not heard anything from his insurer either.   He has no complaints arising from his hepatitis C or therapy.  PAST MEDICAL HISTORY:  He was treated for a respiratory tract infection with azithromycin and a course prednisone.  He describes developing what may have been thrush.  He may have also been switched to Cipro for lack of response to the antibiotic.  CURRENT MEDICATIONS:  Hydrochlorothiazide 25 mg p.o. daily, metformin 1000 mg b.i.d., citalopram 40 mg daily, fish oil 1000 mg b.i.d., vitamin D 10,000 units daily, Pegasys 180 mcg weekly, ribavirin 400 mg b.i.d.    Allergies:  Denies.   HABITS:  Still smoking half pack of cigarettes per day.  Stopped smoking for three days but resumed thereafter.  Alcohol denies interval consumption.   REVIEW OF SYSTEMS:  All 10 systems reviewed today with the patient and they are negative other than which is mentioned above. CES-D was 13.   PHYSICAL EXAMINATION:   Constitutional:  Well appearing without significant peripheral wasting. Vital signs: Height 74 inches, weight 284 pounds down 3  pounds from previous.  Blood pressure 168/96, pulse of 81, temperature 97.4 Fahrenheit.   ASSESSMENT:    The patient is a 51 year old gentleman with history of genotype 2 hepatitis C, who has now completed 16 weeks of therapy with pegylated interferon and ribavirin, having started on 10/05/2010. His pretreatment viral load was 2,920,000  IU/mL, declining to 104 at week 4, and undetectable at week 6.  I never heard back from his insurer for approval to extend therapy, and in the absence of firm data to suggest this is needed for a genotype 2 patient, I will assume that I do not have approval and will stop treatment at week 24.    Today we discussed his current progress.  We discussed the pros and cons regarding stopping treatment at week 24, and he agreed to stop at week 24.  plan:  1. Hepatitis A and B immune. 2. CBC, liver enzymes today. 3. Return in 4 weeks' time.            Brooke Dare, MD   ADDENDUM [employer] has asked again for his viral kinetics.  I have replied asking again if coverage can be extended to 48 weeks of therapy.

## 2011-02-14 ENCOUNTER — Ambulatory Visit: Payer: 59 | Admitting: Gastroenterology

## 2011-02-21 ENCOUNTER — Ambulatory Visit (INDEPENDENT_AMBULATORY_CARE_PROVIDER_SITE_OTHER): Payer: 59 | Admitting: Gastroenterology

## 2011-02-21 DIAGNOSIS — B182 Chronic viral hepatitis C: Secondary | ICD-10-CM

## 2011-02-21 LAB — CBC WITH DIFFERENTIAL/PLATELET
Eosinophils Absolute: 0 10*3/uL (ref 0.0–0.7)
Hemoglobin: 11.6 g/dL — ABNORMAL LOW (ref 13.0–17.0)
Lymphocytes Relative: 37 % (ref 12–46)
Lymphs Abs: 1.7 10*3/uL (ref 0.7–4.0)
MCH: 31.6 pg (ref 26.0–34.0)
Monocytes Relative: 12 % (ref 3–12)
Neutro Abs: 2.3 10*3/uL (ref 1.7–7.7)
Neutrophils Relative %: 50 % (ref 43–77)
RBC: 3.67 MIL/uL — ABNORMAL LOW (ref 4.22–5.81)
WBC: 4.6 10*3/uL (ref 4.0–10.5)

## 2011-02-22 LAB — HEPATIC FUNCTION PANEL
AST: 57 U/L — ABNORMAL HIGH (ref 0–37)
Alkaline Phosphatase: 73 U/L (ref 39–117)
Bilirubin, Direct: 0.1 mg/dL (ref 0.0–0.3)
Indirect Bilirubin: 0.6 mg/dL (ref 0.0–0.9)
Total Bilirubin: 0.7 mg/dL (ref 0.3–1.2)

## 2011-02-28 NOTE — Progress Notes (Signed)
NAME:  [name], [name]  MR#:  [id number]      DATE:  02/21/2011  DOB:  07-30-1960    cc: Referring and Primary Care Physician:  Marinus Maw, MD, [facility], [address], [place], [place] [address], Fax [phone]    REASON FOR VISIT:  Follow up of genotype 2 hepatitis C at week 20 of therapy.    History:  The patient returns to clinic today unaccompanied. His start date for therapy was 10/05/2010. His week 4 HCV RNA was positive. His week 6 was undetectable. He is tolerating this well. He reports today that he  has run out of pegylated interferon, and indeed today I have received a fax from [facility] indicating that he requires a prior authorization for his PEG interferon. I also received a fax with the  forms for this. He has a sufficient supply of ribavirin but will miss his dose of interferon tomorrow if he does not have any in the next few days. I should also point out that in the past, I discussed the  possibility of extending therapy beyond 24 weeks because his viral load was detectable week 4. I have included this in documentation to  his insurer in the past, but never heard anything from them. He has no complaints referable to his history hepatitis C or treatment.   PAST MEDICAL HISTORY:  No interval change.   CURRENT MEDICATIONS:  Hydrochlorothiazide 25 mg daily, metformin 1000 mg b.i.d., citalopram 40 mg daily, fish oil 1000 mg b.i.d., vitamin D  10,000 units daily, Pegasys 180 mcg weekly, ribavirin 400 mg b.i.d.   ALLERGIES:  Denies.   HABITS:  Still smoking less than half pack of cigarettes per day. Alcohol denies interval consumption.   REVIEW OF SYSTEMS:  All 10 systems reviewed today with the patient and they are negative other than which is mentioned above. His CES-D was 13.   PHYSICAL EXAMINATION:   Constitutional:  Central obesity but otherwise well. Vital signs: Height 74 inches, weight  284 pounds, blood pressure 153/97, pulse of 89, temperature 97.9 Fahrenheit.   ASSESSMENT:  The patient is a 51 year old with a a history of genotype 2 hepatitis C, who has now completed 20 weeks of pegylated interferon and ribavirin, having started on 10/05/2010. His pretreatment viral load  was 2,920,000 international units per mL declining to 104 international units per mL at week 4, and undetectable at week 6. As previously mentioned, I will assume that his insurer will not allow an  extension of therapy beyond 24 weeks. I will complete the prior authorization process for another month of PEG interferon. He needs a dose from our emergency supply so that he will not miss a dose in the  next few days.  I have noted his liver enzymes remain abnormal though his viral loads have been negative since at week 6. I suspect he has some element of  nonalcoholic fatty liver disease (NAFLD). I have explained to him that we can look into this after completion of his HCV therapy.   Today we discussed his completion of therapy. I have explained that if he relapses we have other treatment options becoming available in the  later half of 2013 to 2015. I have given him a syringe from our samples of pegylated interferon to use tomorrow.   PLAN:  1. Standard labs today including CBC and liver enzymes. 2. Hepatitis A and B immune. 3. Completion of prior  authorization forms for Pegasys. 4. One dose of Pegasys in syringe given for him to use tomorrow. 5. Return in 4 months' time at which time will do his 3 month post treatment HCV RNA.            Brooke Dare, MD   ADDENDUM  ALT 54  HgB 11.6  [phone]  D:  Thu Jan 31 19:29:48 2013 ; T:  Thu Jan 31 21:59:38 2013  Job #:  [id number]

## 2011-06-20 ENCOUNTER — Ambulatory Visit (INDEPENDENT_AMBULATORY_CARE_PROVIDER_SITE_OTHER): Payer: 59 | Admitting: Gastroenterology

## 2011-06-20 DIAGNOSIS — B182 Chronic viral hepatitis C: Secondary | ICD-10-CM

## 2011-06-21 LAB — HEPATIC FUNCTION PANEL
ALT: 23 U/L (ref 0–53)
AST: 23 U/L (ref 0–37)
Albumin: 3.9 g/dL (ref 3.5–5.2)
Alkaline Phosphatase: 65 U/L (ref 39–117)
Indirect Bilirubin: 0.4 mg/dL (ref 0.0–0.9)
Total Protein: 6.9 g/dL (ref 6.0–8.3)

## 2011-06-27 ENCOUNTER — Encounter: Payer: Self-pay | Admitting: Gastroenterology

## 2011-06-27 NOTE — Progress Notes (Signed)
   NAME:  [name], [name]  MR#:  [id number]      DATE:  06/20/2011  DOB:  1960/07/26    cc: Referring Physician and Primary Care Physician: Marinus Maw, MD, [facility], [address], [place], [place] [address], Fax [phone]    REASON FOR VISIT:  Follow up genotype 2 HCV, to assess sustained virological response (SVR).   HISTORY:  The patient returns today unaccompanied. His start date for therapy was 10/05/2010. His week 4 HCV RNA was positive, but week 6 was detectable but unquantifiable and his week 6 was undetectable. He discontinued therapy after completing 24 weeks, in the third week of February 2013, which would have been around 03/15/2011. He felt significantly better off the interferon. There are no symptoms to suggest decompensated or cryoglobulin mediated disease.   PAST MEDICAL HISTORY:  No interval change.   CURRENT MEDICATIONS:  1. Hydrochlorothiazide 25 mg daily.  2. Metformin 1000 mg b.i.d.  3. Citalopram 40 mg daily.  4. Glyburide, dose unknown, daily.   ALLERGIES:  Denies.   HABITS:  Still smoking 3/4 pack of cigarettes per day. Alcohol denies interval consumption.   REVIEW OF SYSTEMS:  All 10 systems reviewed today with the patient and they are negative other than which is mentioned above. His CES-D was 7.   PHYSICAL EXAMINATION:  Constitutional: Well appearing but has central obesity. Vital signs: Height 74 inches, weight 287 pounds, up 3 pounds from previously. Blood pressure 142/84, pulse 65, temperature 96.3 Fahrenheit.   ASSESSMENT:  The patient is a 51 year old gentleman with a history of genotype 2 hepatitis C who completed approximately 24 weeks of pegylated interferon and ribavirin having started on 10/05/2010, and finished approximately 03/15/2011. His pretreatment viral load was 29,200,000 international units per mL, declining to 104 international units per mL at week 4 and  undetectable week 6.   Today I will need to measure his viral load to see if he has an SVR (sustained virologic response).    I discussed the significance of today's lab work. I have explained to him that we will be checking his viral load. I have explained the hepatitis C antibody will remain positive for life, even if he does not have circulating virus.   PLAN:  1. Hepatitis A and B immune.  2. Liver enzymes today.  3. Hepatitis C RNA today.  4. He will return in approximately 2-3 month's time for his final HCV RNA, which will constitute the final assessment of his SVR.  5. I told him he must stop smoking.               Brooke Dare, MD   ADDENDUM HCV RNA undetectable.  ALT 23.  [phone]  D:  Thu May 30 18:36:17 2013 ; T:  Thu May 30 21:25:46 2013  Job #:  [id number]

## 2011-08-08 ENCOUNTER — Other Ambulatory Visit (HOSPITAL_COMMUNITY): Payer: Self-pay | Admitting: Internal Medicine

## 2011-08-08 ENCOUNTER — Ambulatory Visit (INDEPENDENT_AMBULATORY_CARE_PROVIDER_SITE_OTHER): Payer: 59 | Admitting: Gastroenterology

## 2011-08-08 ENCOUNTER — Ambulatory Visit (HOSPITAL_COMMUNITY)
Admission: RE | Admit: 2011-08-08 | Discharge: 2011-08-08 | Disposition: A | Discharge status: Home / Self Care | Payer: 59 | Source: Ambulatory Visit | Attending: Internal Medicine | Admitting: Internal Medicine

## 2011-08-08 DIAGNOSIS — E119 Type 2 diabetes mellitus without complications: Secondary | ICD-10-CM | POA: Insufficient documentation

## 2011-08-08 DIAGNOSIS — F17200 Nicotine dependence, unspecified, uncomplicated: Secondary | ICD-10-CM | POA: Insufficient documentation

## 2011-08-08 DIAGNOSIS — I10 Essential (primary) hypertension: Secondary | ICD-10-CM

## 2011-08-08 DIAGNOSIS — Z0000 Encounter for general adult medical examination without abnormal findings: Secondary | ICD-10-CM | POA: Insufficient documentation

## 2011-08-08 DIAGNOSIS — B182 Chronic viral hepatitis C: Secondary | ICD-10-CM

## 2011-08-08 LAB — HEPATIC FUNCTION PANEL
ALT: 24 U/L (ref 0–53)
Alkaline Phosphatase: 75 U/L (ref 39–117)
Bilirubin, Direct: 0.1 mg/dL (ref 0.0–0.3)
Indirect Bilirubin: 0.4 mg/dL (ref 0.0–0.9)
Total Protein: 7.4 g/dL (ref 6.0–8.3)

## 2011-08-15 ENCOUNTER — Encounter: Payer: Self-pay | Admitting: Gastroenterology

## 2011-08-15 NOTE — Progress Notes (Signed)
   NAME:  [name], [name]    MR#:  [id number]      DATE:  08/08/2011  DOB:  [birth year]    cc:     REFERRING AND PRIMARY CARE PHYSICIAN:  Marinus Maw, MD, [facility], [address] [place], [address], [place], [place] [address], fax [phone].   REASON FOR VISIT: Follow up genotype II HCV to assess sustained virological response.   HISTORY: The patient returns today unaccompanied. His start date for therapy was 10/05/2010. His week 4 HCV RNA was detectable but not quantifiable and his week 6 HCV RNA was undetectable. He completed 24 weeks of treatment around 03/15/2011 without need for significant dose reductions in medications. He is back for followup to do his final HCV RNA. He feels well. His week 4 HCV RNA was 104 international units per mL, and his week 6 HCV RNA was undetectable.   PAST MEDICAL HISTORY: No interval change.   CURRENT MEDICATIONS:  1. Hydrochlorothiazide 25 mg daily.  2. Metformin 1000 mg b.i.d.  3. Citalopram 40 mg daily.  4. Glyburide, dose unknown, daily.   ALLERGIES: Denies.   HABITS: Still smoking 3/4 pack of cigarettes per day. Alcohol denies interval consumption.   REVIEW OF SYSTEMS: All 10 systems reviewed today with the patient and they are negative other than which is mentioned above. A CES-D was 4.   PHYSICAL EXAMINATION: Constitutional:  Central obesity, but otherwise well. Vital signs: Height 74 inches, weight 289 pounds, blood pressure 140/89, pulse of 85, temperature is 98.4 Fahrenheit.   LABORATORIES: Previous labs on 08/05/2011 showed that his ALT was 26 with an AST of 26 and ALT of 70. His total bilirubin was 0.4, albumin was 4.0.   ASSESSMENT: The patient is a 51 year old gentleman with history of genotype 2 hepatitis C who completed approximately 24 weeks of pegylated interferon and ribavirin starting on 10/05/2010 to approximately 03/15/2011. His pretreatment viral load was  29,200,000 international units per mL, declining to 104 international units per mL week 4 and undetectable at week 6.    In my discussion today with the patient we discussed obtaining his HCV RNA today. I explained if it is negative, he is considered cured, cured to the extent that he does not need followup with me. I have told him that his antibody will remain positive for life, but only an HCV RNA will determine whether he is actively infected.   PLAN:  1. Hepatitis A and B immune.  2. HCV RNA today.  3. I reinforced the need for smoking cessation and weight loss.  4. Will send him a letter if his HCV RNA is negative to inform him of this and he will be discharged from the clinic thereafter.               Brooke Dare, MD   ADDENDUM  No detectable level of HCV RNA.    [phone]  D:  Thu Jul 18 19:37:33 2013 ; T:  Fri Jul 19 09:31:51 2013  Job #:  [id number]

## 2012-01-13 ENCOUNTER — Other Ambulatory Visit: Payer: Self-pay | Admitting: Internal Medicine

## 2012-01-13 ENCOUNTER — Ambulatory Visit (HOSPITAL_COMMUNITY)
Admission: RE | Admit: 2012-01-13 | Discharge: 2012-01-13 | Disposition: A | Discharge status: Home / Self Care | Payer: 59 | Source: Ambulatory Visit | Attending: Internal Medicine | Admitting: Internal Medicine

## 2012-01-13 ENCOUNTER — Ambulatory Visit
Admission: RE | Admit: 2012-01-13 | Discharge: 2012-01-13 | Disposition: A | Discharge status: Home / Self Care | Payer: 59 | Source: Ambulatory Visit | Attending: Internal Medicine | Admitting: Internal Medicine

## 2012-01-13 ENCOUNTER — Other Ambulatory Visit (HOSPITAL_COMMUNITY): Payer: Self-pay | Admitting: Internal Medicine

## 2012-01-13 DIAGNOSIS — R52 Pain, unspecified: Secondary | ICD-10-CM

## 2012-01-13 DIAGNOSIS — E119 Type 2 diabetes mellitus without complications: Secondary | ICD-10-CM | POA: Insufficient documentation

## 2012-01-13 DIAGNOSIS — M79609 Pain in unspecified limb: Secondary | ICD-10-CM | POA: Insufficient documentation

## 2012-01-13 DIAGNOSIS — R609 Edema, unspecified: Secondary | ICD-10-CM

## 2012-01-13 DIAGNOSIS — M7989 Other specified soft tissue disorders: Secondary | ICD-10-CM | POA: Insufficient documentation

## 2012-03-19 ENCOUNTER — Other Ambulatory Visit (HOSPITAL_COMMUNITY): Payer: Self-pay | Admitting: Internal Medicine

## 2012-03-19 ENCOUNTER — Ambulatory Visit (HOSPITAL_COMMUNITY)
Admission: RE | Admit: 2012-03-19 | Discharge: 2012-03-19 | Disposition: A | Discharge status: Home / Self Care | Payer: 59 | Source: Ambulatory Visit | Attending: Internal Medicine | Admitting: Internal Medicine

## 2012-03-19 DIAGNOSIS — M5030 Other cervical disc degeneration, unspecified cervical region: Secondary | ICD-10-CM | POA: Insufficient documentation

## 2012-03-19 DIAGNOSIS — R209 Unspecified disturbances of skin sensation: Secondary | ICD-10-CM | POA: Insufficient documentation

## 2012-07-07 ENCOUNTER — Other Ambulatory Visit: Payer: Self-pay | Admitting: Internal Medicine

## 2012-07-07 DIAGNOSIS — M542 Cervicalgia: Secondary | ICD-10-CM

## 2012-07-10 ENCOUNTER — Ambulatory Visit
Admission: RE | Admit: 2012-07-10 | Discharge: 2012-07-10 | Disposition: A | Discharge status: Home / Self Care | Payer: 59 | Source: Ambulatory Visit | Attending: Internal Medicine | Admitting: Internal Medicine

## 2012-07-10 DIAGNOSIS — M542 Cervicalgia: Secondary | ICD-10-CM

## 2012-08-05 ENCOUNTER — Other Ambulatory Visit (HOSPITAL_COMMUNITY): Payer: Self-pay | Admitting: Internal Medicine

## 2012-08-05 ENCOUNTER — Ambulatory Visit (HOSPITAL_COMMUNITY)
Admission: RE | Admit: 2012-08-05 | Discharge: 2012-08-05 | Disposition: A | Discharge status: Home / Self Care | Payer: 59 | Source: Ambulatory Visit | Attending: Internal Medicine | Admitting: Internal Medicine

## 2012-08-05 DIAGNOSIS — I10 Essential (primary) hypertension: Secondary | ICD-10-CM | POA: Insufficient documentation

## 2012-08-05 DIAGNOSIS — E119 Type 2 diabetes mellitus without complications: Secondary | ICD-10-CM | POA: Insufficient documentation

## 2013-02-03 DIAGNOSIS — I10 Essential (primary) hypertension: Secondary | ICD-10-CM | POA: Insufficient documentation

## 2013-02-03 DIAGNOSIS — Z87442 Personal history of urinary calculi: Secondary | ICD-10-CM | POA: Insufficient documentation

## 2013-02-03 DIAGNOSIS — E349 Endocrine disorder, unspecified: Secondary | ICD-10-CM | POA: Insufficient documentation

## 2013-02-03 DIAGNOSIS — E785 Hyperlipidemia, unspecified: Secondary | ICD-10-CM

## 2013-02-03 DIAGNOSIS — E1169 Type 2 diabetes mellitus with other specified complication: Secondary | ICD-10-CM | POA: Insufficient documentation

## 2013-02-03 DIAGNOSIS — E559 Vitamin D deficiency, unspecified: Secondary | ICD-10-CM | POA: Insufficient documentation

## 2013-02-03 DIAGNOSIS — Z8619 Personal history of other infectious and parasitic diseases: Secondary | ICD-10-CM | POA: Insufficient documentation

## 2013-02-04 ENCOUNTER — Encounter: Payer: Self-pay | Admitting: Internal Medicine

## 2013-02-04 ENCOUNTER — Ambulatory Visit (INDEPENDENT_AMBULATORY_CARE_PROVIDER_SITE_OTHER): Payer: BC Managed Care – PPO | Admitting: Emergency Medicine

## 2013-02-04 VITALS — BP 136/80 | HR 80 | Temp 98.1°F | Resp 18 | Wt 279.8 lb

## 2013-02-04 DIAGNOSIS — R21 Rash and other nonspecific skin eruption: Secondary | ICD-10-CM

## 2013-02-04 DIAGNOSIS — B379 Candidiasis, unspecified: Secondary | ICD-10-CM

## 2013-02-04 DIAGNOSIS — E119 Type 2 diabetes mellitus without complications: Secondary | ICD-10-CM

## 2013-02-04 DIAGNOSIS — I10 Essential (primary) hypertension: Secondary | ICD-10-CM

## 2013-02-04 DIAGNOSIS — E782 Mixed hyperlipidemia: Secondary | ICD-10-CM

## 2013-02-04 LAB — CBC WITH DIFFERENTIAL/PLATELET
BASOS ABS: 0.1 10*3/uL (ref 0.0–0.1)
Basophils Relative: 1 % (ref 0–1)
EOS PCT: 2 % (ref 0–5)
Eosinophils Absolute: 0.3 10*3/uL (ref 0.0–0.7)
HEMATOCRIT: 49.8 % (ref 39.0–52.0)
HEMOGLOBIN: 17.5 g/dL — AB (ref 13.0–17.0)
LYMPHS PCT: 33 % (ref 12–46)
Lymphs Abs: 4.6 10*3/uL — ABNORMAL HIGH (ref 0.7–4.0)
MCH: 33.3 pg (ref 26.0–34.0)
MCHC: 35.1 g/dL (ref 30.0–36.0)
MCV: 94.9 fL (ref 78.0–100.0)
MONO ABS: 0.9 10*3/uL (ref 0.1–1.0)
MONOS PCT: 7 % (ref 3–12)
Neutro Abs: 7.9 10*3/uL — ABNORMAL HIGH (ref 1.7–7.7)
Neutrophils Relative %: 57 % (ref 43–77)
Platelets: 227 10*3/uL (ref 150–400)
RBC: 5.25 MIL/uL (ref 4.22–5.81)
RDW: 14 % (ref 11.5–15.5)
WBC: 13.8 10*3/uL — AB (ref 4.0–10.5)

## 2013-02-04 MED ORDER — FLUCONAZOLE 150 MG PO TABS: 150.0000 mg | ORAL_TABLET | Freq: Every day | ORAL | Status: DC

## 2013-02-04 MED ORDER — TRIAMCINOLONE ACETONIDE 0.1 % EX CREA: 1.0000 "application " | TOPICAL_CREAM | Freq: Two times a day (BID) | CUTANEOUS | Status: DC

## 2013-02-04 NOTE — Progress Notes (Signed)
Subjective:    Patient ID: [name], male    DOB: 01/16/1961, 53 y.o.   MRN: [id number]  HPI Comments: 53 YO MALE presents for 3 month F/U for HTN, Cholesterol, DM, D. Deficient. BP is good at home. LAST LABS BS 118 A1C  7.3 T 200 TG 754 H 25 D 59 STARTED INVOKANMET in 09/2012 and has improvement with BS. He denies any low BS episodes. BP 115/70 at home. He has decreased portions but still eating late at qhs. He is also still eating unhealthy. He is not exercising routinely.  He has noticed increase in rash size on right LE. He notes he has chronically had patch over old surgical incision/ hardware area but notes rash has spread over last couple of weeks. He notes he even has rash on head of penis. He has tried RX cream from previous rash which ahs hleped some. He does not wish to stop new RX for Dm even if rash is SE.  Rash   Current Outpatient Prescriptions on File Prior to Visit  Medication Sig Dispense Refill  . Canagliflozin (INVOKANA) 300 MG TABS Take 300 mg by mouth daily.      . citalopram (CELEXA) 40 MG tablet Take 40 mg by mouth daily.      . cyclobenzaprine (FLEXERIL) 10 MG tablet Take 10 mg by mouth 3 (three) times daily as needed for muscle spasms.      . hydrochlorothiazide (HYDRODIURIL) 25 MG tablet Take 25 mg by mouth daily.      Marland Kitchen. HYDROcodone-acetaminophen (NORCO/VICODIN) 5-325 MG per tablet Take 1 tablet by mouth every 4 (four) hours as needed for moderate pain.      . fenofibrate micronized (LOFIBRA) 134 MG capsule Take 134 mg by mouth daily before breakfast.      . glyBURIDE (DIABETA) 5 MG tablet Take 5 mg by mouth daily with breakfast.      . metFORMIN (GLUCOPHAGE-XR) 500 MG 24 hr tablet Take 500 mg by mouth. Take 2 tablets twice daily       No current facility-administered medications on file prior to visit.   ALLERGIES Morphine and related and Codeine camsylate  Past Medical History  Diagnosis Date  . Hypertension   . Hyperlipidemia   . Type II or  unspecified type diabetes mellitus without mention of complication, not stated as uncontrolled   . Vitamin D deficiency   . History of kidney stones   . Diabetic neuropathy   . Other testicular hypofunction   . History of hepatitis C       Review of Systems  Skin: Positive for rash.  All other systems reviewed and are negative.   BP 136/80  Pulse 80  Temp(Src) 98.1 F (36.7 C)  Resp 18  Wt 279 lb 12.8 oz (126.916 kg)     Objective:   Physical Exam  Nursing note and vitals reviewed. Constitutional: He is oriented to person, place, and time. He appears well-developed and well-nourished.  HENT:  Head: Normocephalic and atraumatic.  Right Ear: External ear normal.  Left Ear: External ear normal.  Nose: Nose normal.  Eyes: Conjunctivae and EOM are normal.  Neck: Normal range of motion. Neck supple. No JVD present. No thyromegaly present.  Cardiovascular: Normal rate, regular rhythm, normal heart sounds and intact distal pulses.   Pulmonary/Chest: Effort normal and breath sounds normal.  Abdominal: Soft. Bowel sounds are normal. He exhibits no distension and no mass. There is no tenderness. There is no rebound and no  guarding.  Musculoskeletal: Normal range of motion. He exhibits no edema and no tenderness.  Lymphadenopathy:    He has no cervical adenopathy.  Neurological: He is alert and oriented to person, place, and time. He has normal reflexes. No cranial nerve deficit. Coordination normal.  Skin: Skin is warm and dry.     Psychiatric: He has a normal mood and affect. His behavior is normal. Judgment and thought content normal.          Assessment & Plan:  1.  3 month F/U for HTN, Cholesterol,DM, D. Deficient. Needs healthy diet, cardio QD and obtain healthy weight. Check Labs, Check BP if >130/80 call office, Check BS if >200 call office 2. ? Psoriasis vs Yeast- Triamcinolone Cream AD, DIflucan 150 mg AD, probiotics QD, push H2o may need to D/C DM RX if no relief  vs ref DERM

## 2013-02-04 NOTE — Patient Instructions (Signed)
Yeast Infection of the Skin Some yeast on the skin is normal, but sometimes it causes an infection. If you have a yeast infection, it shows up as white or light brown patches on brown skin. You can see it better in the summer on tan skin. It causes light-colored holes in your suntan. It can happen on any area of the body. This cannot be passed from person to person. HOME CARE  Scrub your skin daily with a dandruff shampoo. Your rash may take a couple weeks to get well.  Do not scratch or itch the rash. GET HELP RIGHT AWAY IF:   You get another infection from scratching. The skin may get warm, red, and may ooze fluid.  The infection does not seem to be getting better. MAKE SURE YOU:  Understand these instructions.  Will watch your condition.  Will get help right away if you are not doing well or get worse. Document Released: 12/21/2007 Document Revised: 04/01/2011 Document Reviewed: 12/21/2007 [employer] Patient Information 2014 [employer].  

## 2013-02-05 LAB — LIPID PANEL
CHOLESTEROL: 222 mg/dL — AB (ref 0–200)
HDL: 27 mg/dL — ABNORMAL LOW (ref >39)
TRIGLYCERIDES: 700 mg/dL — AB (ref <150)
Total CHOL/HDL Ratio: 8.2 Ratio

## 2013-02-05 LAB — HEPATIC FUNCTION PANEL
ALT: 46 U/L (ref 0–53)
AST: 34 U/L (ref 0–37)
Albumin: 4.5 g/dL (ref 3.5–5.2)
Alkaline Phosphatase: 79 U/L (ref 39–117)
Bilirubin, Direct: 0.1 mg/dL (ref 0.0–0.3)
Indirect Bilirubin: 0.4 mg/dL (ref 0.0–0.9)
Total Bilirubin: 0.5 mg/dL (ref 0.3–1.2)
Total Protein: 7.7 g/dL (ref 6.0–8.3)

## 2013-02-05 LAB — BASIC METABOLIC PANEL WITH GFR
BUN: 17 mg/dL (ref 6–23)
CALCIUM: 9.9 mg/dL (ref 8.4–10.5)
CO2: 27 mEq/L (ref 19–32)
Chloride: 99 mEq/L (ref 96–112)
Creat: 0.98 mg/dL (ref 0.50–1.35)
GFR, Est Non African American: 87 mL/min
Glucose, Bld: 122 mg/dL — ABNORMAL HIGH (ref 70–99)
Potassium: 4.2 mEq/L (ref 3.5–5.3)
SODIUM: 138 meq/L (ref 135–145)

## 2013-02-05 LAB — HEMOGLOBIN A1C
Hgb A1c MFr Bld: 7 % — ABNORMAL HIGH (ref <5.7)
Mean Plasma Glucose: 154 mg/dL — ABNORMAL HIGH (ref <117)

## 2013-02-08 ENCOUNTER — Encounter: Payer: Self-pay | Admitting: Emergency Medicine

## 2013-02-26 ENCOUNTER — Other Ambulatory Visit: Payer: Self-pay | Admitting: Internal Medicine

## 2013-04-05 ENCOUNTER — Other Ambulatory Visit: Payer: Self-pay | Admitting: Emergency Medicine

## 2013-04-05 ENCOUNTER — Ambulatory Visit
Admission: RE | Admit: 2013-04-05 | Discharge: 2013-04-05 | Disposition: A | Discharge status: Home / Self Care | Payer: BC Managed Care – PPO | Source: Ambulatory Visit | Attending: Emergency Medicine | Admitting: Emergency Medicine

## 2013-04-05 ENCOUNTER — Encounter: Payer: Self-pay | Admitting: Emergency Medicine

## 2013-04-05 ENCOUNTER — Ambulatory Visit (INDEPENDENT_AMBULATORY_CARE_PROVIDER_SITE_OTHER): Payer: BC Managed Care – PPO | Admitting: Emergency Medicine

## 2013-04-05 VITALS — BP 158/82 | HR 78 | Temp 98.2°F | Resp 18 | Ht 74.5 in | Wt 275.0 lb

## 2013-04-05 DIAGNOSIS — R51 Headache: Secondary | ICD-10-CM

## 2013-04-05 DIAGNOSIS — I10 Essential (primary) hypertension: Secondary | ICD-10-CM

## 2013-04-05 DIAGNOSIS — E1159 Type 2 diabetes mellitus with other circulatory complications: Secondary | ICD-10-CM

## 2013-04-05 DIAGNOSIS — G510 Bell's palsy: Secondary | ICD-10-CM

## 2013-04-05 DIAGNOSIS — R519 Headache, unspecified: Secondary | ICD-10-CM

## 2013-04-05 LAB — BASIC METABOLIC PANEL WITH GFR
BUN: 13 mg/dL (ref 6–23)
CHLORIDE: 98 meq/L (ref 96–112)
CO2: 27 meq/L (ref 19–32)
CREATININE: 0.81 mg/dL (ref 0.50–1.35)
Calcium: 9.6 mg/dL (ref 8.4–10.5)
GFR, Est African American: >89 mL/min
GFR, Est Non African American: >89 mL/min
GLUCOSE: 107 mg/dL — AB (ref 70–99)
Potassium: 4.2 mEq/L (ref 3.5–5.3)
Sodium: 135 mEq/L (ref 135–145)

## 2013-04-05 LAB — CBC WITH DIFFERENTIAL/PLATELET
BASOS ABS: 0 10*3/uL (ref 0.0–0.1)
BASOS PCT: 0 % (ref 0–1)
EOS PCT: 1 % (ref 0–5)
Eosinophils Absolute: 0.1 10*3/uL (ref 0.0–0.7)
HEMATOCRIT: 48.9 % (ref 39.0–52.0)
HEMOGLOBIN: 17.4 g/dL — AB (ref 13.0–17.0)
Lymphocytes Relative: 36 % (ref 12–46)
Lymphs Abs: 4.1 10*3/uL — ABNORMAL HIGH (ref 0.7–4.0)
MCH: 33 pg (ref 26.0–34.0)
MCHC: 35.6 g/dL (ref 30.0–36.0)
MCV: 92.8 fL (ref 78.0–100.0)
MONO ABS: 1 10*3/uL (ref 0.1–1.0)
MONOS PCT: 9 % (ref 3–12)
Neutro Abs: 6.1 10*3/uL (ref 1.7–7.7)
Neutrophils Relative %: 54 % (ref 43–77)
Platelets: 200 10*3/uL (ref 150–400)
RBC: 5.27 MIL/uL (ref 4.22–5.81)
RDW: 13.8 % (ref 11.5–15.5)
WBC: 11.3 10*3/uL — ABNORMAL HIGH (ref 4.0–10.5)

## 2013-04-05 MED ORDER — HYDROCODONE-ACETAMINOPHEN 5-325 MG PO TABS: 1.0000 | ORAL_TABLET | ORAL | Status: DC | PRN

## 2013-04-05 MED ORDER — PREDNISONE 10 MG PO TABS: ORAL_TABLET | ORAL | Status: DC

## 2013-04-05 MED ORDER — IOHEXOL 300 MG/ML  SOLN
100.0000 mL | Freq: Once | INTRAMUSCULAR | Status: AC | PRN
  Administered 2013-04-05: 100 mL via INTRAVENOUS

## 2013-04-05 NOTE — Progress Notes (Signed)
Subjective:    Patient ID: [name], male    DOB: [birth year], 53 y.o.   MRN: [id number]  HPI Comments: 53 yo male presents for F/U for HTN, DM with new start Invokanamet.  He checks feet routinely and denies skin break down or neuropathy increase. He has Hx of neuropathy but it has not increased. He is trying to improve diet . He notes exercise is only with work. He notes BP good at home. He notes BS around 120s. He is down 5 # from last visit.   He has had increased left side facial paralysis since Saturday. He thinks he may have bells palsy. He denies any other weakness. He was sick a couple of weeks ago. He has had a mild headache in the back x 1 week.        Medication List       This list is accurate as of: 04/05/13 11:39 AM.  Always use your most recent med list.               citalopram 40 MG tablet  Commonly known as:  CELEXA  Take 40 mg by mouth daily.     cyclobenzaprine 10 MG tablet  Commonly known as:  FLEXERIL  Take 10 mg by mouth 3 (three) times daily as needed for muscle spasms.     fenofibrate micronized 134 MG capsule  Commonly known as:  LOFIBRA  Take 134 mg by mouth daily before breakfast.     hydrochlorothiazide 25 MG tablet  Commonly known as:  HYDRODIURIL  TAKE ONE TABLET BY MOUTH EVERY DAY     HYDROcodone-acetaminophen 5-325 MG per tablet  Commonly known as:  NORCO/VICODIN  Take 1 tablet by mouth every 4 (four) hours as needed for moderate pain.     INVOKAMET 50-1000 MG Tabs  Generic drug:  Canagliflozin-Metformin HCl  Take by mouth daily.     triamcinolone cream 0.1 %  Commonly known as:  KENALOG  Apply 1 application topically 2 (two) times daily.     Vitamin D (Ergocalciferol) 50000 UNITS Caps capsule  Commonly known as:  DRISDOL  TAKE ONE CAPSULE BY MOUTH EVERY DAY         Review of Systems  Neurological: Positive for facial asymmetry and numbness.  All other systems reviewed and are negative.   BP 158/82  Pulse 78   Temp(Src) 98.2 F (36.8 C) (Temporal)  Resp 18  Ht 6' 2.5" (1.892 m)  Wt 275 lb (124.739 kg)  BMI 34.85 kg/m2     Objective:   Physical Exam  Nursing note and vitals reviewed. Constitutional: He is oriented to person, place, and time. He appears well-developed and well-nourished.  HENT:  Head: Normocephalic and atraumatic.  Right Ear: External ear normal.  Left Ear: External ear normal.  Nose: Nose normal.  Mouth/Throat: Oropharynx is clear and moist.  Eyes: Conjunctivae and EOM are normal. Pupils are equal, round, and reactive to light.  Neck: Normal range of motion. Neck supple. No JVD present. No thyromegaly present.  Cardiovascular: Normal rate, regular rhythm, normal heart sounds and intact distal pulses.   Pulmonary/Chest: Effort normal and breath sounds normal.  Abdominal: Soft. Bowel sounds are normal. He exhibits no distension and no mass. There is no tenderness. There is no rebound and no guarding.  Musculoskeletal: Normal range of motion. He exhibits no edema and no tenderness.  Lymphadenopathy:    He has no cervical adenopathy.  Neurological: He is alert and oriented to  person, place, and time. He has normal reflexes. A cranial nerve deficit is present. Coordination abnormal.  Left eye not blinking, left side lip not moving NUMBNESS BOTH FEET AT TOES ONLY (no change per pt)  Skin: Skin is warm and dry.  Psychiatric: He has a normal mood and affect. His behavior is normal. Judgment and thought content normal.      DIABETIC FOOT EXAM    Assessment & Plan:  1. HTN/ DM w/ peripheral neuropathy- Check BP call if >130/80, increase cardio, recheck labs with new RX start  2. Headache with ? Bells palsy w/ left facial paralysis- Ct Head w/o, Advised of hygiene for Bells palsy, Pred Dp 10 mg AD, w/c if SX increase or ER.

## 2013-04-05 NOTE — Patient Instructions (Signed)
Peripheral Neuropathy Peripheral neuropathy is a type of nerve damage. It affects nerves that carry signals between the spinal cord and other parts of the body. These are called peripheral nerves. With peripheral neuropathy, one nerve or a group of nerves may be damaged.  CAUSES  Many things can damage peripheral nerves. For some people with peripheral neuropathy, the cause is unknown. Some causes include:  Diabetes. This is the most common cause of peripheral neuropathy.  Injury to a nerve.  Pressure or stress on a nerve that lasts a long time.  Too little vitamin B. Alcoholism can lead to this.  Infections.  Autoimmune diseases, such as multiple sclerosis and systemic lupus erythematosus.  Inherited nerve diseases.  Some medicines, such as cancer drugs.  Toxic substances, such as lead and mercury.  Too little blood flowing to the legs.  Kidney disease.  Thyroid disease. SIGNS AND SYMPTOMS  Different people have different symptoms. The symptoms you have will depend on which of your nerves is damaged. Common symptoms include:  Loss of feeling (numbness) in the feet and hands.  Tingling in the feet and hands.  Pain that burns.  Very sensitive skin.  Weakness.  Not being able to move a part of the body (paralysis).  Muscle twitching.  Clumsiness or poor coordination.  Loss of balance.  Not being able to control your bladder.  Feeling dizzy.  Sexual problems. DIAGNOSIS  Peripheral neuropathy is a symptom, not a disease. Finding the cause of peripheral neuropathy can be hard. To figure that out, your health care provider will take a medical history and do a physical exam. A neurological exam will also be done. This involves checking things affected by your brain, spinal cord, and nerves (nervous system). For example, your health care provider will check your reflexes, how you move, and what you can feel.  Other types of tests may also be ordered, such as:  Blood  tests.  A test of the fluid in your spinal cord.  Imaging tests, such as CT scans or an MRI.  Electromyography (EMG). This test checks the nerves that control muscles.  Nerve conduction velocity tests. These tests check how fast messages pass through your nerves.  Nerve biopsy. A small piece of nerve is removed. It is then checked under a microscope. TREATMENT   Medicine is often used to treat peripheral neuropathy. Medicines may include:  Pain-relieving medicines. Prescription or over-the-counter medicine may be suggested.  Antiseizure medicine. This may be used for pain.  Antidepressants. These also may help ease pain from neuropathy.  Lidocaine. This is a numbing medicine. You might wear a patch or be given a shot.  Mexiletine. This medicine is typically used to help control irregular heart rhythms.  Surgery. Surgery may be needed to relieve pressure on a nerve or to destroy a nerve that is causing pain.  Physical therapy to help movement.  Assistive devices to help movement. HOME CARE INSTRUCTIONS   Only take over-the-counter or prescription medicines as directed by your health care provider. Follow the instructions carefully for any given medicines. Do not take any other medicines without first getting approval from your health care provider.  If you have diabetes, work closely with your health care provider to keep your blood sugar under control.  If you have numbness in your feet:  Check every day for signs of injury or infection. Watch for redness, warmth, and swelling.  Wear padded socks and comfortable shoes. These help protect your feet.  Do not do   things that put pressure on your damaged nerve.  Do not smoke. Smoking keeps blood from getting to damaged nerves.  Avoid or limit alcohol. Too much alcohol can cause a lack of B vitamins. These vitamins are needed for healthy nerves.  Develop a good support system. Coping with peripheral neuropathy can be  stressful. Talk to a mental health specialist or join a support group if you are struggling.  Follow up with your health care provider as directed. SEEK MEDICAL CARE IF:   You have new signs or symptoms of peripheral neuropathy.  You are struggling emotionally from dealing with peripheral neuropathy.  You have a fever. SEEK IMMEDIATE MEDICAL CARE IF:   You have an injury or infection that is not healing.  You feel very dizzy or begin vomiting.  You have chest pain.  You have trouble breathing. Document Released: 12/28/2001 Document Revised: 09/19/2010 Document Reviewed: 09/14/2012 [facility] Patient Information 2014 [place], [place]. Ischemic Stroke Blood carries oxygen to all areas of your body. A stroke happens when your blood does not flow to your brain like normal. If this happens, your brain will not get the oxygen it needs and brain tissue will die. This is an emergency. Problems (symptoms) of a stroke usually happen suddenly. You may notice them when you wake up. They can include:  Loss of feeling or weakness on one side of the body (face, arm, leg).  Feeling confused.  Trouble talking or understanding.  Trouble seeing.  Trouble walking.  Feeling dizzy.  Loss of balance or coordination.  Severe headache without a cause.  Trouble reading or writing. Get help within 3 4 hours of when your problems first started. If you do not know when your problems started, get help as soon as you can. This is important.  RISK FACTORS  Risk factors are things that make it more likely for you to have a stroke. These things include:  High blood pressure (hypertension).  High cholesterol.  Diabetes.  Heart disease.  Having a buildup of fatty deposits in the blood vessels.  Having an abnormal heart rhythm (atrial fibrillation).  Being very overweight (obese).  Smoking.  Taking birth control pills, especially if you smoke.  Not being active.  Having a diet high in  fats, salt, and calories.  Drinking too much alcohol.  Using illegal drugs.  Being African American.  Being over the age of 74.  Having a family history of stroke.  Having a history of blood clots, stroke, warning stroke (transient ischemic attack, TIA), or heart attack.  Sickle cell disease. HOME CARE  Take all medicines exactly as told by your doctor. Understand all your medicine instructions.  You may need to take aspirin or warfarin medicine. Take warfarin exactly as told.  Taking too much or too little warfarin is dangerous. Get regular blood tests as told, including the PT and INR tests. The test results help your doctor adjust your dose of warfarin. Your PT and INR levels must be done as often as told by your doctor.  Food can cause problems with warfarin and affect the results of your blood tests. This is true for foods high in vitamin K, such as spinach, kale, broccoli, cabbage, collard and turnip greens, brussels sprouts, peas, cauliflower, seaweed, and parsley, as well as beef and pork liver, green tea, and soybean oil. Eat the same amount of food high in vitamin K. Avoid major changes in your diet. Tell your doctor before changing your diet. Talk to a food specialist (  dietitian) if you have questions.  Many medicines can cause problems with warfarin and affect your PT and INR test results. Tell your doctor about all medicines you take. This includes vitamins and dietary pills (supplements). Be careful with aspirin and medicines that relieve redness, soreness, and puffiness (inflammation). Do not take or stop medicines unless your doctor tells you to.  Warfarin can cause a lot of bruising or bleeding. Hold pressure over cuts for longer than normal. Talk to your doctor about other side effects of warfarin.  Avoid sports or activities that may cause injury or bleeding.  Be careful when you shave, floss your teeth, or use sharp objects.  Avoid alcoholic drinks or drink very  little alcohol while taking warfarin. Tell your doctor if you change how much alcohol you drink.  Tell your dentist and other doctors that you take warfarin before procedures.  If you are able to swallow, eat healthy foods. Eat 5 or more servings of fruits and vegetables a day. Eat soft foods, pureed foods, or eat small bites of food so you do not choke.  Follow your diet program as told, if you are given one.  Keep a healthy weight.  Stay active. Try to get at least 30 minutes of activity on most or all days.  Do not smoke.  Limit how much alcohol you drink even if you are not taking warfarin. Moderate alcohol use is:  No more than 2 drinks each day for men.  No more than 1 drink each day for women who are not pregnant.  Stop abusing drugs.  Keep your home safe so you do not fall. Try:  Putting grab bars in the bedroom and bathroom.  Raising toilet seats.  Putting a seat in the shower.  Go to therapy sessions (physical, occupational, and speech) as told by your doctor.  Use a walker or cane at all times if told to do so.  Keep all doctor visits as told. GET HELP IF:  Your personality changes.  You have trouble swallowing.  You are seeing two of everything.  You are dizzy.  You have a fever.  Your skin starts to break down. GET HELP RIGHT AWAY IF:  The symptoms below may be a sign of an emergency. Do not wait to see if the symptoms go away. Call for help (911 in [place] Do not drive yourself to the hospital.  You have sudden weakness or numbness on the face, arm, or leg (especially on one side of the body).  You have sudden trouble walking or moving your arms or legs.  You have sudden confusion.  You have trouble talking or understanding.  You have sudden trouble seeing in one or both eyes.  You lose your balance or your movements are not smooth.  You have a sudden, severe headache with no known cause.  You have new chest pain or you feel your heart  beating in a unsteady way.  You are partly or totally unaware of what is going on around you. Document Released: 12/27/2010 Document Revised: 09/09/2012 Document Reviewed: 08/18/2011 [facility] Patient Information 2014 [place], [place]. Bell's Palsy Bell's palsy is a condition in which one side of the face becomes temporarily weak or paralyzed. Most of the time no cause is found. A viral infection of the facial nerve is the most commonly suspected cause. The condition almost always clears up in a few weeks to months. However, in a small number of people, the weakness can be permanent. Sometimes steroid  medicines and antiviral medicines are prescribed to improve recovery time. Blood and other tests are usually not needed, but they may be performed at your caregiver's discretion, to rule out other causes. Careful follow up is importantto be sure the facial nerve is recovering. Because facial weakness can make it hard to blink, it is important to prevent drying of the eye. Artificial tears are often prescribed to keep the eye lubricated. Glasses or an eye patch should be worn to protect the eye, if you cannot close your eye completely. If the eye is not protected, permanent damage can be done to the cornea (clear covering over your eye). Sometimes facial massage and exercises help weakened muscles recover.  SEEK IMMEDIATE MEDICAL CARE IF:   You have increased weakness, earache, headache, or dizziness.  You develop new problems or symptoms, or the area of weakness or paralysis extends beyond the face.  You feel you are getting worse. Document Released: 02/15/2004 Document Revised: 04/01/2011 Document Reviewed: 01/16/2009 [facility] Patient Information 2014 [place].

## 2013-05-19 ENCOUNTER — Ambulatory Visit (INDEPENDENT_AMBULATORY_CARE_PROVIDER_SITE_OTHER): Payer: BC Managed Care – PPO | Admitting: Internal Medicine

## 2013-05-19 ENCOUNTER — Encounter: Payer: Self-pay | Admitting: Internal Medicine

## 2013-05-19 VITALS — BP 126/78 | HR 76 | Temp 97.7°F | Resp 16 | Ht 74.5 in | Wt 276.2 lb

## 2013-05-19 DIAGNOSIS — E559 Vitamin D deficiency, unspecified: Secondary | ICD-10-CM

## 2013-05-19 DIAGNOSIS — I10 Essential (primary) hypertension: Secondary | ICD-10-CM

## 2013-05-19 DIAGNOSIS — E119 Type 2 diabetes mellitus without complications: Secondary | ICD-10-CM

## 2013-05-19 DIAGNOSIS — E785 Hyperlipidemia, unspecified: Secondary | ICD-10-CM

## 2013-05-19 DIAGNOSIS — Z79899 Other long term (current) drug therapy: Secondary | ICD-10-CM | POA: Insufficient documentation

## 2013-05-19 LAB — CBC WITH DIFFERENTIAL/PLATELET
BASOS PCT: 0 % (ref 0–1)
Basophils Absolute: 0 10*3/uL (ref 0.0–0.1)
EOS ABS: 0.3 10*3/uL (ref 0.0–0.7)
EOS PCT: 3 % (ref 0–5)
HCT: 45.1 % (ref 39.0–52.0)
Hemoglobin: 15.9 g/dL (ref 13.0–17.0)
LYMPHS ABS: 3 10*3/uL (ref 0.7–4.0)
Lymphocytes Relative: 31 % (ref 12–46)
MCH: 32.6 pg (ref 26.0–34.0)
MCHC: 35.3 g/dL (ref 30.0–36.0)
MCV: 92.4 fL (ref 78.0–100.0)
MONOS PCT: 9 % (ref 3–12)
Monocytes Absolute: 0.9 10*3/uL (ref 0.1–1.0)
NEUTROS PCT: 57 % (ref 43–77)
Neutro Abs: 5.5 10*3/uL (ref 1.7–7.7)
PLATELETS: 193 10*3/uL (ref 150–400)
RBC: 4.88 MIL/uL (ref 4.22–5.81)
RDW: 13.5 % (ref 11.5–15.5)
WBC: 9.6 10*3/uL (ref 4.0–10.5)

## 2013-05-19 LAB — TSH: TSH: 1.334 u[IU]/mL (ref 0.350–4.500)

## 2013-05-19 LAB — LIPID PANEL
CHOLESTEROL: 136 mg/dL (ref 0–200)
HDL: 24 mg/dL — ABNORMAL LOW (ref >39)
LDL Cholesterol: 65 mg/dL (ref 0–99)
Total CHOL/HDL Ratio: 5.7 Ratio
Triglycerides: 234 mg/dL — ABNORMAL HIGH (ref <150)
VLDL: 47 mg/dL — ABNORMAL HIGH (ref 0–40)

## 2013-05-19 LAB — HEMOGLOBIN A1C
HEMOGLOBIN A1C: 6.4 % — AB (ref <5.7)
MEAN PLASMA GLUCOSE: 137 mg/dL — AB (ref <117)

## 2013-05-19 LAB — HEPATIC FUNCTION PANEL
ALBUMIN: 3.8 g/dL (ref 3.5–5.2)
ALT: 26 U/L (ref 0–53)
AST: 20 U/L (ref 0–37)
Alkaline Phosphatase: 80 U/L (ref 39–117)
BILIRUBIN DIRECT: 0.1 mg/dL (ref 0.0–0.3)
Indirect Bilirubin: 0.3 mg/dL (ref 0.2–1.2)
Total Bilirubin: 0.4 mg/dL (ref 0.2–1.2)
Total Protein: 6.5 g/dL (ref 6.0–8.3)

## 2013-05-19 LAB — BASIC METABOLIC PANEL WITH GFR
BUN: 18 mg/dL (ref 6–23)
CALCIUM: 9.5 mg/dL (ref 8.4–10.5)
CO2: 30 meq/L (ref 19–32)
CREATININE: 1.06 mg/dL (ref 0.50–1.35)
Chloride: 100 mEq/L (ref 96–112)
GFR, Est Non African American: 79 mL/min
GLUCOSE: 133 mg/dL — AB (ref 70–99)
Potassium: 3.9 mEq/L (ref 3.5–5.3)
SODIUM: 137 meq/L (ref 135–145)

## 2013-05-19 LAB — MAGNESIUM: MAGNESIUM: 1.9 mg/dL (ref 1.5–2.5)

## 2013-05-19 NOTE — Progress Notes (Signed)
Patient ID: [name], male   DOB: [birth year], 53 y.o.   MRN: [id number]    This very nice 53 y.o. MWM presents for 3 month follow up with Hypertension, Hyperlipidemia, T2 NIDDM w/peripheral neuropathy and Vitamin D Deficiency.    HTN predates since the 1990's. BP has been controlled at home. Today's BP: 126/78 mmHg . Patient denies any cardiac type chest pain, palpitations, dyspnea/orthopnea/PND, dizziness, claudication, or dependent edema.   Hyperlipidemia is controlled with diet & meds. Last Cholesterol was 200, Triglycerides were 754, HDL 25 and LDL not calculated in Oct [id number]. Patient denies myalgias or other med SE's.    Also, the patient has history of T2 NIDDM w/ Neoropathy since 2008 with last A1c of  7.3% in Oct 2014. Patient denies any symptoms of reactive hypoglycemia, diabetic polys or visual blurring, but does report numbness and burning paresthesias of the toes.   Further, Patient has history of Vitamin D Deficiency with last vitamin D of   . Patient supplements vitamin D without any suspected side-effects.  Medication Sig  . Canagliflozin-Metformin INVOKAMET 50-1000   Take by mouth daily.  . citalopram (CELEXA) 40 MG tablet Take 40 mg by mouth daily.  . cyclobenzaprine (FLEXERIL) 10 MG tablet Take 10 mg by mouth 3 (three) times daily as needed for muscle spasms.  . fenofibrate  (LOFIBRA) 134 MG c Take 134 mg by mouth daily before breakfast.  . Hydrochlorothiazide 25 MG tablet TAKE ONE TABLET BY MOUTH EVERY DAY  . NORCO/VICODIN 5-325 MG per tablet Take 1 tablet by mouth every 4 (four) hours as needed for moderate pain.  Marland Kitchen. triamcinolone cream (KENALOG) 0.1 % Apply 1 application topically 2 (two) times daily.  . Vitamin D, (DRISDOL) 50000 UNITS CAPS  TAKE ONE CAPSULE BY MOUTH EVERY DAY    Allergies  Allergen Reactions  . Morphine And Related   . Codeine Camsylate [Codeine] Rash   PMHx:   Past Medical History  Diagnosis Date  . Hypertension   . Hyperlipidemia   . Type  2 NIDDM w/ peripheral Neuropathy   . Vitamin D deficiency   . History of kidney stones   . Diabetic neuropathy   . Other testicular hypofunction   . History of hepatitis C - treated    FHx:    Reviewed / unchanged  SHx:    Reviewed / unchanged   Systems Review: Constitutional: Denies fever, chills, wt changes, headaches, insomnia, fatigue, night sweats, change in appetite. Eyes: Denies redness, blurred vision, diplopia, discharge, itchy, watery eyes.  ENT: Denies discharge, congestion, post nasal drip, epistaxis, sore throat, earache, hearing loss, dental pain, tinnitus, vertigo, sinus pain, snoring.  CV: Denies chest pain, palpitations, irregular heartbeat, syncope, dyspnea, diaphoresis, orthopnea, PND, claudication, edema. Respiratory: denies cough, dyspnea, DOE, pleurisy, hoarseness, laryngitis, wheezing.  Gastrointestinal: Denies dysphagia, odynophagia, heartburn, reflux, water brash, abdominal pain or cramps, nausea, vomiting, bloating, diarrhea, constipation, hematemesis, melena, hematochezia,  or hemorrhoids. Genitourinary: Denies dysuria, frequency, urgency, nocturia, hesitancy, discharge, hematuria, flank pain. Musculoskeletal: Denies arthralgias, myalgias, stiffness, jt. swelling, pain, limp, strain/sprain.  Skin: Denies pruritus, rash, hives, warts, acne, eczema, change in skin lesion(s). Neuro: No weakness, tremor, incoordination, spasms. Psychiatric: Denies confusion, memory loss, or sensory loss. Endo: Denies change in weight, skin, hair change.  Heme/Lymph: No excessive bleeding, bruising, orenlarged lymph nodes.  Exam:  BP 126/78  Pulse 76  Temp(Src) 97.7 F (36.5 C) (Temporal)  Resp 16  Ht 6' 2.5" (1.892 m)  Wt 276 lb 3.2 oz (125.283 kg)  BMI 35.00 kg/m2  Appears well nourished - in no distress. Eyes: PERRLA, EOMs, conjunctiva no swelling or erythema. Sinuses: No frontal/maxillary tenderness ENT/Mouth: EAC's clear, TM's nl w/o erythema, bulging. Nares clear  w/o erythema, swelling, exudates. Oropharynx clear without erythema or exudates. Oral hygiene is good. Tongue normal, non obstructing. Hearing intact.  Neck: Supple. Thyroid nl. Car 2+/2+ without bruits, nodes or JVD. Chest: Respirations nl with BS clear & equal w/o rales, rhonchi, wheezing or stridor.  Cor: Heart sounds normal w/ regular rate and rhythm without sig. murmurs, gallops, clicks, or rubs. Peripheral pulses normal and equal  without edema.  Abdomen: Soft & bowel sounds normal. Non-tender w/o guarding, rebound, hernias, masses, or organomegaly.  Lymphatics: Unremarkable.  Musculoskeletal: Full ROM all peripheral extremities, joint stability, 5/5 strength, and normal gait.  Skin: Warm, dry without exposed rashes, lesions, ecchymosis apparent.  Neuro: Cranial nerves intact, reflexes equal bilaterally. Sensory-motor testing grossly intact. Tendon reflexes grossly intact.  Pysch: Alert & oriented x 3. Insight and judgement nl & appropriate. No ideations.  Assessment and Plan:  1. Hypertension - Continue monitor blood pressure at home. Continue diet/meds same.  2. Hyperlipidemia - Continue diet/meds, exercise,& lifestyle modifications. Continue monitor periodic cholesterol/liver & renal functions   3. T2 NIDDM - continue recommend prudent low glycemic diet, weight control, regular exercise, diabetic monitoring and periodic eye exams.  4. Vitamin D Deficiency - Continue supplementation.  Recommended regular exercise, BP monitoring, weight control, and discussed med and SE's. Recommended labs to assess and monitor clinical status. Further disposition pending results of labs.

## 2013-05-19 NOTE — Patient Instructions (Signed)

## 2013-05-20 LAB — INSULIN, FASTING: Insulin fasting, serum: 53 u[IU]/mL — ABNORMAL HIGH (ref 3–28)

## 2013-05-20 LAB — VITAMIN D 25 HYDROXY (VIT D DEFICIENCY, FRACTURES): VIT D 25 HYDROXY: 73 ng/mL (ref 30–89)

## 2013-05-26 ENCOUNTER — Other Ambulatory Visit: Payer: Self-pay | Admitting: Internal Medicine

## 2013-07-19 ENCOUNTER — Other Ambulatory Visit: Payer: Self-pay | Admitting: Physician Assistant

## 2013-08-04 ENCOUNTER — Encounter: Payer: Self-pay | Admitting: Internal Medicine

## 2013-08-09 ENCOUNTER — Other Ambulatory Visit: Payer: Self-pay

## 2013-08-09 MED ORDER — CYCLOBENZAPRINE HCL 10 MG PO TABS: 10.0000 mg | ORAL_TABLET | Freq: Three times a day (TID) | ORAL | Status: DC | PRN

## 2013-08-22 ENCOUNTER — Other Ambulatory Visit: Payer: Self-pay | Admitting: Physician Assistant

## 2013-08-22 ENCOUNTER — Other Ambulatory Visit: Payer: Self-pay | Admitting: Emergency Medicine

## 2013-08-31 ENCOUNTER — Ambulatory Visit (INDEPENDENT_AMBULATORY_CARE_PROVIDER_SITE_OTHER): Payer: BC Managed Care – PPO | Admitting: Internal Medicine

## 2013-08-31 ENCOUNTER — Encounter: Payer: Self-pay | Admitting: Internal Medicine

## 2013-08-31 VITALS — BP 154/86 | HR 72 | Temp 97.5°F | Resp 16 | Ht 74.25 in | Wt 279.2 lb

## 2013-08-31 DIAGNOSIS — N182 Chronic kidney disease, stage 2 (mild): Secondary | ICD-10-CM

## 2013-08-31 DIAGNOSIS — Z125 Encounter for screening for malignant neoplasm of prostate: Secondary | ICD-10-CM

## 2013-08-31 DIAGNOSIS — E1122 Type 2 diabetes mellitus with diabetic chronic kidney disease: Secondary | ICD-10-CM | POA: Insufficient documentation

## 2013-08-31 DIAGNOSIS — E1129 Type 2 diabetes mellitus with other diabetic kidney complication: Secondary | ICD-10-CM

## 2013-08-31 DIAGNOSIS — B1920 Unspecified viral hepatitis C without hepatic coma: Secondary | ICD-10-CM

## 2013-08-31 DIAGNOSIS — Z113 Encounter for screening for infections with a predominantly sexual mode of transmission: Secondary | ICD-10-CM

## 2013-08-31 DIAGNOSIS — R740 Nonspecific elevation of levels of transaminase and lactic acid dehydrogenase [LDH]: Secondary | ICD-10-CM

## 2013-08-31 DIAGNOSIS — R7402 Elevation of levels of lactic acid dehydrogenase (LDH): Secondary | ICD-10-CM

## 2013-08-31 DIAGNOSIS — I10 Essential (primary) hypertension: Secondary | ICD-10-CM

## 2013-08-31 DIAGNOSIS — Z111 Encounter for screening for respiratory tuberculosis: Secondary | ICD-10-CM

## 2013-08-31 DIAGNOSIS — E559 Vitamin D deficiency, unspecified: Secondary | ICD-10-CM

## 2013-08-31 DIAGNOSIS — Z79899 Other long term (current) drug therapy: Secondary | ICD-10-CM

## 2013-08-31 DIAGNOSIS — Z0000 Encounter for general adult medical examination without abnormal findings: Secondary | ICD-10-CM

## 2013-08-31 DIAGNOSIS — Z1212 Encounter for screening for malignant neoplasm of rectum: Secondary | ICD-10-CM

## 2013-08-31 LAB — CBC WITH DIFFERENTIAL/PLATELET
BASOS ABS: 0.1 10*3/uL (ref 0.0–0.1)
Basophils Relative: 1 % (ref 0–1)
EOS PCT: 2 % (ref 0–5)
Eosinophils Absolute: 0.2 10*3/uL (ref 0.0–0.7)
HCT: 50.8 % (ref 39.0–52.0)
HEMOGLOBIN: 18.3 g/dL — AB (ref 13.0–17.0)
Lymphocytes Relative: 36 % (ref 12–46)
Lymphs Abs: 3.6 10*3/uL (ref 0.7–4.0)
MCH: 33.2 pg (ref 26.0–34.0)
MCHC: 36 g/dL (ref 30.0–36.0)
MCV: 92.2 fL (ref 78.0–100.0)
MONOS PCT: 9 % (ref 3–12)
Monocytes Absolute: 0.9 10*3/uL (ref 0.1–1.0)
Neutro Abs: 5.1 10*3/uL (ref 1.7–7.7)
Neutrophils Relative %: 52 % (ref 43–77)
Platelets: 196 10*3/uL (ref 150–400)
RBC: 5.51 MIL/uL (ref 4.22–5.81)
RDW: 13.7 % (ref 11.5–15.5)
WBC: 9.9 10*3/uL (ref 4.0–10.5)

## 2013-08-31 LAB — HEMOGLOBIN A1C
HEMOGLOBIN A1C: 6.6 % — AB (ref <5.7)
Mean Plasma Glucose: 143 mg/dL — ABNORMAL HIGH (ref <117)

## 2013-08-31 NOTE — Patient Instructions (Addendum)
Recommend the book "The END of DIETING" by Dr Baker Janus   and the book "The END of DIABETES " by Dr Excell Seltzer  At [email or web address] - get book & Audio CD's      Being diabetic has a  300% increased risk for heart attack, stroke, cancer, and alzheimer- type vascular dementia. It is very important that you work harder with diet by avoiding all foods that are white except chicken & fish. Avoid white rice (brown & wild rice is OK), white potatoes (sweetpotatoes in moderation is OK), White bread or wheat bread or anything made out of white flour like bagels, donuts, rolls, buns, biscuits, cakes, pastries, cookies, pizza crust, and pasta (made from white flour & egg whites) - vegetarian pasta or spinach or wheat pasta is OK. Multigrain breads like Arnold's or [employer], or multigrain sandwich thins or flatbreads.  Diet, exercise and weight loss can reverse and cure diabetes in the early stages.  Diet, exercise and weight loss is very important in the control and prevention of complications of diabetes which affects every system in your body, ie. Brain - dementia/stroke, eyes - glaucoma/blindness, heart - heart attack/heart failure, kidneys - dialysis, stomach - gastric paralysis, intestines - malabsorption, nerves - severe painful neuritis, circulation - gangrene & loss of a leg(s), and finally cancer and Alzheimers.    I recommend avoid fried & greasy foods,  sweets/candy, white rice (brown or wild rice or Quinoa is OK), white potatoes (sweet potatoes are OK) - anything made from white flour - bagels, doughnuts, rolls, buns, biscuits,white and wheat breads, pizza crust and traditional pasta made of white flour & egg white(vegetarian pasta or spinach or wheat pasta is OK).  Multi-grain bread is OK - like multi-grain flat bread or sandwich thins. Avoid alcohol in excess. Exercise is also important.    Eat all the vegetables you want - avoid meat, especially red meat and dairy - especially cheese.  Cheese  is the most concentrated form of trans-fats which is the worst thing to clog up our arteries. Veggie cheese is OK which can be found in the fresh produce section at [employer] or [employer] or [employer] A healthy lifestyle and preventative care can promote health and wellness.  Maintain regular health, dental, and eye exams.  Eat a healthy diet. Foods like vegetables, fruits, whole grains, low-fat dairy products, and lean protein foods contain the nutrients you need and are low in calories. Decrease your intake of foods high in solid fats, added sugars, and salt. Get information about a proper diet from your health care provider, if necessary.  Regular physical exercise is one of the most important things you can do for your health. Most adults should get at least 150 minutes of moderate-intensity exercise (any activity that increases your heart rate and causes you to sweat) each week. In addition, most adults need muscle-strengthening exercises on 2 or more days a week.   Maintain a healthy weight. The body mass index (BMI) is a screening tool to identify possible weight problems. It provides an estimate of body fat based on height and weight. Your health care provider can find your BMI and can help you achieve or maintain a healthy weight. For males 20 years and older:  A BMI below 18.5 is considered underweight.  A BMI of 18.5 to 24.9 is normal.  A BMI of 25 to 29.9 is considered overweight.  A BMI of 30 and above is considered obese.  Maintain normal blood lipids and cholesterol by exercising and minimizing your intake of saturated fat. Eat a balanced diet with plenty of fruits and vegetables. Blood tests for lipids and cholesterol should begin at age 38 and be repeated every 5 years. If your lipid or cholesterol levels are high, you are over age 6, or you are at high risk for heart disease, you may need your cholesterol levels checked more frequently.Ongoing  high lipid and cholesterol levels should be treated with medicines if diet and exercise are not working.  If you smoke, find out from your health care provider how to quit. If you do not use tobacco, do not start.  Lung cancer screening is recommended for adults aged 36-80 years who are at high risk for developing lung cancer because of a history of smoking. A yearly low-dose CT scan of the lungs is recommended for people who have at least a 30-pack-year history of smoking and are current smokers or have quit within the past 15 years. A pack year of smoking is smoking an average of 1 pack of cigarettes a day for 1 year (for example, a 30-pack-year history of smoking could mean smoking 1 pack a day for 30 years or 2 packs a day for 15 years). Yearly screening should continue until the smoker has stopped smoking for at least 15 years. Yearly screening should be stopped for people who develop a health problem that would prevent them from having lung cancer treatment.  If you choose to drink alcohol, do not have more than 2 drinks per day. One drink is considered to be 12 oz (360 mL) of beer, 5 oz (150 mL) of wine, or 1.5 oz (45 mL) of liquor.  Avoid the use of street drugs. Do not share needles with anyone. Ask for help if you need support or instructions about stopping the use of drugs.  High blood pressure causes heart disease and increases the risk of stroke. Blood pressure should be checked at least every 1-2 years. Ongoing high blood pressure should be treated with medicines if weight loss and exercise are not effective.  If you are 33-10 years old, ask your health care provider if you should take aspirin to prevent heart disease.  Diabetes screening involves taking a blood sample to check your fasting blood sugar level. This should be done once every 3 years after age 10 if you are at a normal weight and without risk factors for diabetes. Testing should be considered at a younger age or be carried  out more frequently if you are overweight and have at least 1 risk factor for diabetes.  Colorectal cancer can be detected and often prevented. Most routine colorectal cancer screening begins at the age of 32 and continues through age 56. However, your health care provider may recommend screening at an earlier age if you have risk factors for colon cancer. On a yearly basis, your health care provider may provide home test kits to check for hidden blood in the stool. A small camera at the end of a tube may be used to directly examine the colon (sigmoidoscopy or colonoscopy) to detect the earliest forms of colorectal cancer. Talk to your health care provider about this at age 36 when routine screening begins. A direct exam of the colon should be repeated every 5-10 years through age 73, unless early forms of precancerous polyps or small growths are found.  People who are at an increased risk for hepatitis B should be screened for  this virus. You are considered at high risk for hepatitis B if:  You were born in a country where hepatitis B occurs often. Talk with your health care provider about which countries are considered high risk.  Your parents were born in a high-risk country and you have not received a shot to protect against hepatitis B (hepatitis B vaccine).  You have HIV or AIDS.  You use needles to inject street drugs.  You live with, or have sex with, someone who has hepatitis B.  You are a man who has sex with other men (MSM).  You get hemodialysis treatment.  You take certain medicines for conditions like cancer, organ transplantation, and autoimmune conditions.  Hepatitis C blood testing is recommended for all people born from [birth year] through 1965 and any individual with known risk factors for hepatitis C.  Healthy men should no longer receive prostate-specific antigen (PSA) blood tests as part of routine cancer screening. Talk to your health care provider about prostate cancer  screening.  Testicular cancer screening is not recommended for adolescents or adult males who have no symptoms. Screening includes self-exam, a health care provider exam, and other screening tests. Consult with your health care provider about any symptoms you have or any concerns you have about testicular cancer.  Practice safe sex. Use condoms and avoid high-risk sexual practices to reduce the spread of sexually transmitted infections (STIs).  You should be screened for STIs, including gonorrhea and chlamydia if:  You are sexually active and are younger than 24 years.  You are older than 24 years, and your health care provider tells you that you are at risk for this type of infection.  Your sexual activity has changed since you were last screened, and you are at an increased risk for chlamydia or gonorrhea. Ask your health care provider if you are at risk.  If you are at risk of being infected with HIV, it is recommended that you take a prescription medicine daily to prevent HIV infection. This is called pre-exposure prophylaxis (PrEP). You are considered at risk if:  You are a man who has sex with other men (MSM).  You are a heterosexual man who is sexually active with multiple partners.  You take drugs by injection.  You are sexually active with a partner who has HIV.  Talk with your health care provider about whether you are at high risk of being infected with HIV. If you choose to begin PrEP, you should first be tested for HIV. You should then be tested every 3 months for as long as you are taking PrEP.  Use sunscreen. Apply sunscreen liberally and repeatedly throughout the day. You should seek shade when your shadow is shorter than you. Protect yourself by wearing long sleeves, pants, a wide-brimmed hat, and sunglasses year round whenever you are outdoors.  Tell your health care provider of new moles or changes in moles, especially if there is a change in shape or color. Also, tell  your health care provider if a mole is larger than the size of a pencil eraser.  A one-time screening for abdominal aortic aneurysm (AAA) and surgical repair of large AAAs by ultrasound is recommended for men aged 33-75 years who are current or former smokers.  Stay current with your vaccines (immunizations).   Preventive Care for Adults A healthy lifestyle and preventive care can promote health and wellness. Preventive health guidelines for men include the following key practices:  A routine yearly physical is a good  way to check with your health care provider about your health and preventative screening. It is a chance to share any concerns and updates on your health and to receive a thorough exam.  Visit your dentist for a routine exam and preventative care every 6 months. Brush your teeth twice a day and floss once a day. Good oral hygiene prevents tooth decay and gum disease.  The frequency of eye exams is based on your age, health, family medical history, use of contact lenses, and other factors. Follow your health care provider's recommendations for frequency of eye exams.  Eat a healthy diet. Foods such as vegetables, fruits, whole grains, low-fat dairy products, and lean protein foods contain the nutrients you need without too many calories. Decrease your intake of foods high in solid fats, added sugars, and salt. Eat the right amount of calories for you.Get information about a proper diet from your health care provider, if necessary.  Regular physical exercise is one of the most important things you can do for your health. Most adults should get at least 150 minutes of moderate-intensity exercise (any activity that increases your heart rate and causes you to sweat) each week. In addition, most adults need muscle-strengthening exercises on 2 or more days a week.  Maintain a healthy weight. The body mass index (BMI) is a screening tool to identify possible weight problems. It provides an  estimate of body fat based on height and weight. Your health care provider can find your BMI and can help you achieve or maintain a healthy weight.For adults 20 years and older:  A BMI below 18.5 is considered underweight.  A BMI of 18.5 to 24.9 is normal.  A BMI of 25 to 29.9 is considered overweight.  A BMI of 30 and above is considered obese.  Maintain normal blood lipids and cholesterol levels by exercising and minimizing your intake of saturated fat. Eat a balanced diet with plenty of fruit and vegetables. Blood tests for lipids and cholesterol should begin at age 65 and be repeated every 5 years. If your lipid or cholesterol levels are high, you are over 50, or you are at high risk for heart disease, you may need your cholesterol levels checked more frequently.Ongoing high lipid and cholesterol levels should be treated with medicines if diet and exercise are not working.  If you smoke, find out from your health care provider how to quit. If you do not use tobacco, do not start.  Lung cancer screening is recommended for adults aged 88-80 years who are at high risk for developing lung cancer because of a history of smoking. A yearly low-dose CT scan of the lungs is recommended for people who have at least a 30-pack-year history of smoking and are a current smoker or have quit within the past 15 years. A pack year of smoking is smoking an average of 1 pack of cigarettes a day for 1 year (for example: 1 pack a day for 30 years or 2 packs a day for 15 years). Yearly screening should continue until the smoker has stopped smoking for at least 15 years. Yearly screening should be stopped for people who develop a health problem that would prevent them from having lung cancer treatment.  If you choose to drink alcohol, do not have more than 2 drinks per day. One drink is considered to be 12 ounces (355 mL) of beer, 5 ounces (148 mL) of wine, or 1.5 ounces (44 mL) of liquor.  Avoid use of  street  drugs. Do not share needles with anyone. Ask for help if you need support or instructions about stopping the use of drugs.  High blood pressure causes heart disease and increases the risk of stroke. Your blood pressure should be checked at least every 1-2 years. Ongoing high blood pressure should be treated with medicines, if weight loss and exercise are not effective.  If you are 34-8 years old, ask your health care provider if you should take aspirin to prevent heart disease.  Diabetes screening involves taking a blood sample to check your fasting blood sugar level. This should be done once every 3 years, after age 62, if you are within normal weight and without risk factors for diabetes. Testing should be considered at a younger age or be carried out more frequently if you are overweight and have at least 1 risk factor for diabetes.  Colorectal cancer can be detected and often prevented. Most routine colorectal cancer screening begins at the age of 71 and continues through age 29. However, your health care provider may recommend screening at an earlier age if you have risk factors for colon cancer. On a yearly basis, your health care provider may provide home test kits to check for hidden blood in the stool. Use of a small camera at the end of a tube to directly examine the colon (sigmoidoscopy or colonoscopy) can detect the earliest forms of colorectal cancer. Talk to your health care provider about this at age 62, when routine screening begins. Direct exam of the colon should be repeated every 5-10 years through age 78, unless early forms of precancerous polyps or small growths are found.  People who are at an increased risk for hepatitis B should be screened for this virus. You are considered at high risk for hepatitis B if:  You were born in a country where hepatitis B occurs often. Talk with your health care provider about which countries are considered high risk.  Your parents were born in a  high-risk country and you have not received a shot to protect against hepatitis B (hepatitis B vaccine).  You have HIV or AIDS.  You use needles to inject street drugs.  You live with, or have sex with, someone who has hepatitis B.  You are a man who has sex with other men (MSM).  You get hemodialysis treatment.  You take certain medicines for conditions such as cancer, organ transplantation, and autoimmune conditions.  Hepatitis C blood testing is recommended for all people born from [birth year] through 1965 and any individual with known risks for hepatitis C.  Practice safe sex. Use condoms and avoid high-risk sexual practices to reduce the spread of sexually transmitted infections (STIs). STIs include gonorrhea, chlamydia, syphilis, trichomonas, herpes, HPV, and human immunodeficiency virus (HIV). Herpes, HIV, and HPV are viral illnesses that have no cure. They can result in disability, cancer, and death.  If you are at risk of being infected with HIV, it is recommended that you take a prescription medicine daily to prevent HIV infection. This is called preexposure prophylaxis (PrEP). You are considered at risk if:  You are a man who has sex with other men (MSM) and have other risk factors.  You are a heterosexual man, are sexually active, and are at increased risk for HIV infection.  You take drugs by injection.  You are sexually active with a partner who has HIV.  Talk with your health care provider about whether you are at high risk of being  infected with HIV. If you choose to begin PrEP, you should first be tested for HIV. You should then be tested every 3 months for as long as you are taking PrEP.  A one-time screening for abdominal aortic aneurysm (AAA) and surgical repair of large AAAs by ultrasound are recommended for men ages 37 to 71 years who are current or former smokers.  Healthy men should no longer receive prostate-specific antigen (PSA) blood tests as part of routine  cancer screening. Talk with your health care provider about prostate cancer screening.  Testicular cancer screening is not recommended for adult males who have no symptoms. Screening includes self-exam, a health care provider exam, and other screening tests. Consult with your health care provider about any symptoms you have or any concerns you have about testicular cancer.  Use sunscreen. Apply sunscreen liberally and repeatedly throughout the day. You should seek shade when your shadow is shorter than you. Protect yourself by wearing long sleeves, pants, a wide-brimmed hat, and sunglasses year round, whenever you are outdoors.  Once a month, do a whole-body skin exam, using a mirror to look at the skin on your back. Tell your health care provider about new moles, moles that have irregular borders, moles that are larger than a pencil eraser, or moles that have changed in shape or color.  Stay current with required vaccines (immunizations).  Influenza vaccine. All adults should be immunized every year.  Tetanus, diphtheria, and acellular pertussis (Td, Tdap) vaccine. An adult who has not previously received Tdap or who does not know his vaccine status should receive 1 dose of Tdap. This initial dose should be followed by tetanus and diphtheria toxoids (Td) booster doses every 10 years. Adults with an unknown or incomplete history of completing a 3-dose immunization series with Td-containing vaccines should begin or complete a primary immunization series including a Tdap dose. Adults should receive a Td booster every 10 years.  Varicella vaccine. An adult without evidence of immunity to varicella should receive 2 doses or a second dose if he has previously received 1 dose.  Human papillomavirus (HPV) vaccine. Males aged 62-21 years who have not received the vaccine previously should receive the 3-dose series. Males aged 22-26 years may be immunized. Immunization is recommended through the age of 60  years for any male who has sex with males and did not get any or all doses earlier. Immunization is recommended for any person with an immunocompromised condition through the age of 61 years if he did not get any or all doses earlier. During the 3-dose series, the second dose should be obtained 4-8 weeks after the first dose. The third dose should be obtained 24 weeks after the first dose and 16 weeks after the second dose.  Zoster vaccine. One dose is recommended for adults aged 85 years or older unless certain conditions are present.  Measles, mumps, and rubella (MMR) vaccine. Adults born before [birth year] generally are considered immune to measles and mumps. Adults born in [birth year] or later should have 1 or more doses of MMR vaccine unless there is a contraindication to the vaccine or there is laboratory evidence of immunity to each of the three diseases. A routine second dose of MMR vaccine should be obtained at least 28 days after the first dose for students attending postsecondary schools, health care workers, or international travelers. People who received inactivated measles vaccine or an unknown type of measles vaccine during 1963-1967 should receive 2 doses of MMR vaccine. People who received  inactivated mumps vaccine or an unknown type of mumps vaccine before 1979 and are at high risk for mumps infection should consider immunization with 2 doses of MMR vaccine. Unvaccinated health care workers born before [birth year] who lack laboratory evidence of measles, mumps, or rubella immunity or laboratory confirmation of disease should consider measles and mumps immunization with 2 doses of MMR vaccine or rubella immunization with 1 dose of MMR vaccine.  Pneumococcal 13-valent conjugate (PCV13) vaccine. When indicated, a person who is uncertain of his immunization history and has no record of immunization should receive the PCV13 vaccine. An adult aged 51 years or older who has certain medical conditions and has not been  previously immunized should receive 1 dose of PCV13 vaccine. This PCV13 should be followed with a dose of pneumococcal polysaccharide (PPSV23) vaccine. The PPSV23 vaccine dose should be obtained at least 8 weeks after the dose of PCV13 vaccine. An adult aged 30 years or older who has certain medical conditions and previously received 1 or more doses of PPSV23 vaccine should receive 1 dose of PCV13. The PCV13 vaccine dose should be obtained 1 or more years after the last PPSV23 vaccine dose.  Pneumococcal polysaccharide (PPSV23) vaccine. When PCV13 is also indicated, PCV13 should be obtained first. All adults aged 52 years and older should be immunized. An adult younger than age 34 years who has certain medical conditions should be immunized. Any person who resides in a nursing home or long-term care facility should be immunized. An adult smoker should be immunized. People with an immunocompromised condition and certain other conditions should receive both PCV13 and PPSV23 vaccines. People with human immunodeficiency virus (HIV) infection should be immunized as soon as possible after diagnosis. Immunization during chemotherapy or radiation therapy should be avoided. Routine use of PPSV23 vaccine is not recommended for American Indians, [place]atives, or people younger than 65 years unless there are medical conditions that require PPSV23 vaccine. When indicated, people who have unknown immunization and have no record of immunization should receive PPSV23 vaccine. One-time revaccination 5 years after the first dose of PPSV23 is recommended for people aged 19-64 years who have chronic kidney failure, nephrotic syndrome, asplenia, or immunocompromised conditions. People who received 1-2 doses of PPSV23 before age 64 years should receive another dose of PPSV23 vaccine at age 52 years or later if at least 5 years have passed since the previous dose. Doses of PPSV23 are not needed for people immunized with PPSV23 at or  after age 10 years.  Meningococcal vaccine. Adults with asplenia or persistent complement component deficiencies should receive 2 doses of quadrivalent meningococcal conjugate (MenACWY-D) vaccine. The doses should be obtained at least 2 months apart. Microbiologists working with certain meningococcal bacteria, [employer] recruits, people at risk during an outbreak, and people who travel to or live in countries with a high rate of meningitis should be immunized. A first-year college student up through age 20 years who is living in a residence hall should receive a dose if he did not receive a dose on or after his 16th birthday. Adults who have certain high-risk conditions should receive one or more doses of vaccine.  Hepatitis A vaccine. Adults who wish to be protected from this disease, have certain high-risk conditions, work with hepatitis A-infected animals, work in hepatitis A research labs, or travel to or work in countries with a high rate of hepatitis A should be immunized. Adults who were previously unvaccinated and who anticipate close contact with an international adoptee during the  first 60 days after arrival in the [place] from a country with a high rate of hepatitis A should be immunized.  Hepatitis B vaccine. Adults should be immunized if they wish to be protected from this disease, have certain high-risk conditions, may be exposed to blood or other infectious body fluids, are household contacts or sex partners of hepatitis B positive people, are clients or workers in certain care facilities, or travel to or work in countries with a high rate of hepatitis B.  Haemophilus influenzae type b (Hib) vaccine. A previously unvaccinated person with asplenia or sickle cell disease or having a scheduled splenectomy should receive 1 dose of Hib vaccine. Regardless of previous immunization, a recipient of a hematopoietic stem cell transplant should receive a 3-dose series 6-12 months after his  successful transplant. Hib vaccine is not recommended for adults with HIV infection. Preventive Service / Frequency  . Ages 67 to 49  Blood pressure check.** / Every 1 to 2 years.  Lipid and cholesterol check.** / Every 5 years beginning at age 47.  Lung cancer screening. / Every year if you are aged 55-80 years and have a 30-pack-year history of smoking and currently smoke or have quit within the past 15 years. Yearly screening is stopped once you have quit smoking for at least 15 years or develop a health problem that would prevent you from having lung cancer treatment.  Fecal occult blood test (FOBT) of stool. / Every year beginning at age 52 and continuing until age 32. You may not have to do this test if you get a colonoscopy every 10 years.  Flexible sigmoidoscopy** or colonoscopy.** / Every 5 years for a flexible sigmoidoscopy or every 10 years for a colonoscopy beginning at age 51 and continuing until age 58.  Hepatitis C blood test.** / For all people born from [birth year] through 1967 and any individual with known risks for hepatitis C.  Skin self-exam. / Monthly.  Influenza vaccine. / Every year.  Tetanus, diphtheria, and acellular pertussis (Tdap/Td) vaccine.** / Consult your health care provider. 1 dose of Td every 10 years.  Varicella vaccine.** / Consult your health care provider.  Zoster vaccine.** / 1 dose for adults aged 24 years or older.  Measles, mumps, rubella (MMR) vaccine.** / You need at least 1 dose of MMR if you were born in 1957 or later. You may also need a second dose.  Pneumococcal 13-valent conjugate (PCV13) vaccine.** / Consult your health care provider.  Pneumococcal polysaccharide (PPSV23) vaccine.** / 1 to 2 doses if you smoke cigarettes or if you have certain conditions.  Meningococcal vaccine.** / Consult your health care provider.  Hepatitis A vaccine.** / Consult your health care provider.  Hepatitis B vaccine.** / Consult your health care  provider.  Haemophilus influenzae type b (Hib) vaccine.** / Consult your health care provider.

## 2013-08-31 NOTE — Progress Notes (Signed)
Patient ID: [name], male   DOB: 06/18/1960, 53 y.o.   MRN: [id number]   Annual Screening Comprehensive Examination  This very nice 53 y.o.SWM presents for complete physical.  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   HTN predates since the 1990's. Patient's BP has been controlled at home.Today's BP: 154/86 mmHg. Patient denies any cardiac symptoms as chest pain, palpitations, shortness of breath, dizziness or ankle swelling.   Patient's hyperlipidemia is controlled with diet and medications. Patient denies myalgias or other medication SE's. Last lipids were  Cholesterol  136; HDL  24*; LDL  65; Triglycerides 234 on 05/19/2013.   Patient has Diabetes and patient denies reactive hypoglycemic symptoms, visual blurring, diabetic polys, or paresthesias.He relates CBG's range 120-140's.  Last A1c was 6.4% on 05/19/2013.   Finally, patient has history of Vitamin D Deficiency of 24 in 2008 and last vitamin D was 28 in Oct 2014.  Medication Sig  . citalopram (CELEXA) 40 MG tablet TAKE ONE TABLET BY MOUTH ONCE DAILY FOR  MOOD  . cyclobenzaprine (FLEXERIL) 10 MG tablet Take 1 tablet (10 mg total) by mouth 3 (three) times daily as needed for muscle spasms.  . hydrochlorothiazide (HYDRODIURIL) 25 MG tablet TAKE ONE TABLET BY MOUTH EVERY DAY  . HYDROcodone-acetaminophen (NORCO/VICODIN) 5-325 MG per tablet Take 1 tablet by mouth every 4 (four) hours as needed for moderate pain.  . INVOKAMET [phone] MG TABS TAKE ONE TABLET BY MOUTH TWICE DAILY WITH FOOD  . triamcinolone cream (KENALOG) 0.1 % Apply 1 application topically 2 (two) times daily.  . Vitamin D, Ergocalciferol, (DRISDOL) 50000 UNITS CAPS capsule TAKE ONE CAPSULE BY MOUTH EVERY DAY   Allergies  Allergen Reactions  . Morphine And Related   . Codeine Camsylate [Codeine] Rash   Past Medical History  Diagnosis Date  . Hypertension   . Hyperlipidemia   . Type II or unspecified  type diabetes mellitus without mention of complication, not stated as uncontrolled   . Vitamin D deficiency   . History of kidney stones   . Diabetic neuropathy   . Other testicular hypofunction   . History of hepatitis C    Past Surgical History  Procedure Laterality Date  . Cholecystectomy  1987  . Orif tibia fracture     Family History  Problem Relation Age of Onset  . Hypertension Mother   . Cancer Father     colon  . Alzheimer's disease Father   . Stroke Father    History   Social History  . Marital Status: Married    Spouse Name: N/A    Number of Children: N/A  . Years of Education: N/A   Occupational History  . Not on file.   Social History Main Topics  . Smoking status: Current Every Day Smoker -- 0.75 packs/day    Types: Cigarettes  . Smokeless tobacco: Never Used  . Alcohol Use: No  . Drug Use: Yes    Special: Marijuana  . Sexual Activity: Not on file   Other Topics Concern  . Not on file   Social History Narrative  . No narrative on file    ROS Constitutional: Denies fever, chills, weight loss/gain, headaches, insomnia, fatigue, night sweats or change in appetite. Eyes: Denies redness, blurred vision, diplopia, discharge, itchy or watery eyes.  ENT: Denies discharge, congestion, post nasal drip, epistaxis, sore throat, earache, hearing loss, dental pain, Tinnitus, Vertigo, Sinus pain or snoring.  Cardio: Denies  chest pain, palpitations, irregular heartbeat, syncope, dyspnea, diaphoresis, orthopnea, PND, claudication or edema Respiratory: denies cough, dyspnea, DOE, pleurisy, hoarseness, laryngitis or wheezing.  Gastrointestinal: Denies dysphagia, heartburn, reflux, water brash, pain, cramps, nausea, vomiting, bloating, diarrhea, constipation, hematemesis, melena, hematochezia, jaundice or hemorrhoids Genitourinary: Denies dysuria, frequency, urgency, nocturia, hesitancy, discharge, hematuria or flank pain Musculoskeletal: Denies arthralgia, myalgia,  stiffness, Jt. Swelling, pain, limp or strain/sprain. Denies Falls. Skin: Denies puritis, rash, hives, warts, acne, eczema or change in skin lesion Neuro: No weakness, tremor, incoordination, spasms, paresthesia or pain Psychiatric: Denies confusion, memory loss or sensory loss. Denies Depression. Endocrine: Denies change in weight, skin, hair change, nocturia, and paresthesia, diabetic polys, visual blurring or hyper / hypo glycemic episodes.  Heme/Lymph: No excessive bleeding, bruising or enlarged lymph nodes.  Physical Exam  BP 154/86  Pulse 72  Temp(Src) 97.5 F (36.4 C) (Temporal)  Resp 16  Ht 6' 2.25" (1.886 m)  Wt 279 lb 3.2 oz (126.644 kg)  BMI 35.60 kg/m2  General Appearance: Well nourished, in no apparent distress. Eyes: PERRLA, EOMs, conjunctiva no swelling or erythema, normal fundi and vessels. Sinuses: No frontal/maxillary tenderness ENT/Mouth: EACs patent / TMs  nl. Nares clear without erythema, swelling, mucoid exudates. Oral hygiene is good. No erythema, swelling, or exudate. Tongue normal, non-obstructing. Tonsils not swollen or erythematous. Hearing normal.  Neck: Supple, thyroid normal. No bruits, nodes or JVD. Respiratory: Respiratory effort normal.  BS equal and clear bilateral without rales, rhonci, wheezing or stridor. Cardio: Heart sounds are normal with regular rate and rhythm and no murmurs, rubs or gallops. Peripheral pulses are normal and equal bilaterally without edema. No aortic or femoral bruits. Chest: symmetric with normal excursions and percussion.  Abdomen: Flat, soft, with bowl sounds. Nontender, no guarding, rebound, hernias, masses, or organomegaly.  Lymphatics: Non tender without lymphadenopathy.  Genitourinary: No hernias.Testes nl. DRE - prostate nl for age - smooth & firm w/o nodules. Musculoskeletal: Full ROM all peripheral extremities, joint stability, 5/5 strength, and normal gait. Skin: Warm and dry without rashes, lesions, cyanosis,  clubbing or  ecchymosis.  Neuro: Cranial nerves intact, reflexes equal bilaterally. Normal muscle tone, no cerebellar symptoms. Sensation intact.  Pysch: Awake and oriented X 3with normal affect, insight and judgment appropriate.   Assessment and Plan  1. Annual Screening Examination 2. Hypertension  3. Hyperlipidemia 4. 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 Vitamin D Deficiency 6. Testosterone Deficiency 7. Hx/o Hepatitis C s/p Treatment   Continue prudent diet as discussed, weight control, BP monitoring, regular exercise, and medications as discussed.  Discussed med effects and SE's. Routine screening labs and tests as requested with regular follow-up as recommended.

## 2013-09-01 LAB — LIPID PANEL
Cholesterol: 218 mg/dL — ABNORMAL HIGH (ref 0–200)
HDL: 29 mg/dL — ABNORMAL LOW (ref >39)
Total CHOL/HDL Ratio: 7.5 Ratio
Triglycerides: 461 mg/dL — ABNORMAL HIGH (ref <150)

## 2013-09-01 LAB — MICROALBUMIN / CREATININE URINE RATIO
Creatinine, Urine: 71 mg/dL
Microalb Creat Ratio: 11.7 mg/g (ref 0.0–30.0)
Microalb, Ur: 0.83 mg/dL (ref 0.00–1.89)

## 2013-09-01 LAB — RPR

## 2013-09-01 LAB — BASIC METABOLIC PANEL WITH GFR
BUN: 17 mg/dL (ref 6–23)
CO2: 21 mEq/L (ref 19–32)
Calcium: 9.9 mg/dL (ref 8.4–10.5)
Chloride: 99 mEq/L (ref 96–112)
Creat: 0.8 mg/dL (ref 0.50–1.35)
GFR, Est African American: >89 mL/min
GFR, Est Non African American: >89 mL/min
Glucose, Bld: 126 mg/dL — ABNORMAL HIGH (ref 70–99)
Potassium: 4.3 mEq/L (ref 3.5–5.3)
Sodium: 139 mEq/L (ref 135–145)

## 2013-09-01 LAB — URINALYSIS, MICROSCOPIC ONLY
Bacteria, UA: NONE SEEN
Casts: NONE SEEN
Crystals: NONE SEEN
Squamous Epithelial / LPF: NONE SEEN

## 2013-09-01 LAB — HEPATITIS C RNA QUANTITATIVE: HCV Quantitative: NOT DETECTED IU/mL (ref <15)

## 2013-09-01 LAB — HEPATITIS A ANTIBODY, TOTAL: HEP A TOTAL AB: NONREACTIVE

## 2013-09-01 LAB — VITAMIN B12: Vitamin B-12: 425 pg/mL (ref 211–911)

## 2013-09-01 LAB — HEPATITIS B CORE ANTIBODY, TOTAL: Hep B Core Total Ab: NONREACTIVE

## 2013-09-01 LAB — HEPATIC FUNCTION PANEL
ALBUMIN: 4.6 g/dL (ref 3.5–5.2)
ALT: 47 U/L (ref 0–53)
AST: 33 U/L (ref 0–37)
Alkaline Phosphatase: 87 U/L (ref 39–117)
BILIRUBIN INDIRECT: 0.4 mg/dL (ref 0.2–1.2)
Bilirubin, Direct: 0.1 mg/dL (ref 0.0–0.3)
TOTAL PROTEIN: 7.5 g/dL (ref 6.0–8.3)
Total Bilirubin: 0.5 mg/dL (ref 0.2–1.2)

## 2013-09-01 LAB — PSA: PSA: 1.44 ng/mL (ref <=4.00)

## 2013-09-01 LAB — HIV ANTIBODY (ROUTINE TESTING W REFLEX): HIV 1&2 Ab, 4th Generation: NONREACTIVE

## 2013-09-01 LAB — HEPATITIS C ANTIBODY: HCV Ab: REACTIVE — AB

## 2013-09-01 LAB — TESTOSTERONE: TESTOSTERONE: 125 ng/dL — AB (ref 300–890)

## 2013-09-01 LAB — TSH: TSH: 1.821 u[IU]/mL (ref 0.350–4.500)

## 2013-09-01 LAB — INSULIN, FASTING: INSULIN FASTING, SERUM: 53 u[IU]/mL — AB (ref 3–28)

## 2013-09-01 LAB — HEPATITIS B SURFACE ANTIBODY,QUALITATIVE: Hep B S Ab: POSITIVE — AB

## 2013-09-01 LAB — MAGNESIUM: Magnesium: 2.3 mg/dL (ref 1.5–2.5)

## 2013-09-01 LAB — VITAMIN D 25 HYDROXY (VIT D DEFICIENCY, FRACTURES): Vit D, 25-Hydroxy: 77 ng/mL (ref 30–89)

## 2013-09-02 LAB — HEPATITIS B E ANTIBODY: HEPATITIS BE ANTIBODY: NONREACTIVE

## 2013-09-03 LAB — TB SKIN TEST
Induration: 0 mm
TB Skin Test: NEGATIVE

## 2013-10-15 ENCOUNTER — Other Ambulatory Visit: Payer: Self-pay | Admitting: Emergency Medicine

## 2013-10-15 ENCOUNTER — Other Ambulatory Visit: Payer: Self-pay | Admitting: Internal Medicine

## 2013-11-29 ENCOUNTER — Encounter: Payer: Self-pay | Admitting: Physician Assistant

## 2013-11-29 ENCOUNTER — Ambulatory Visit (INDEPENDENT_AMBULATORY_CARE_PROVIDER_SITE_OTHER): Payer: BC Managed Care – PPO | Admitting: Physician Assistant

## 2013-11-29 VITALS — BP 174/92 | HR 84 | Temp 99.8°F | Resp 18 | Ht 74.25 in | Wt 285.0 lb

## 2013-11-29 DIAGNOSIS — J209 Acute bronchitis, unspecified: Secondary | ICD-10-CM

## 2013-11-29 DIAGNOSIS — I10 Essential (primary) hypertension: Secondary | ICD-10-CM

## 2013-11-29 MED ORDER — LISINOPRIL 10 MG PO TABS: 10.0000 mg | ORAL_TABLET | Freq: Every day | ORAL | Status: DC

## 2013-11-29 MED ORDER — ALBUTEROL SULFATE HFA 108 (90 BASE) MCG/ACT IN AERS: 2.0000 | INHALATION_SPRAY | RESPIRATORY_TRACT | Status: DC | PRN

## 2013-11-29 MED ORDER — PROMETHAZINE-DM 6.25-15 MG/5ML PO SYRP: 5.0000 mL | ORAL_SOLUTION | Freq: Four times a day (QID) | ORAL | Status: DC | PRN

## 2013-11-29 MED ORDER — IPRATROPIUM-ALBUTEROL 0.5-2.5 (3) MG/3ML IN SOLN
3.0000 mL | Freq: Once | RESPIRATORY_TRACT | Status: AC
  Administered 2013-11-29: 3 mL via RESPIRATORY_TRACT

## 2013-11-29 MED ORDER — AZITHROMYCIN 250 MG PO TABS: ORAL_TABLET | ORAL | Status: DC

## 2013-11-29 NOTE — Progress Notes (Signed)
Subjective:    Patient ID: [name], male    DOB: April 29, 1960, 53 y.o.   MRN: [id number]  Sore Throat  This is a new problem. Episode onset: 1 week. Progression since onset: Better thursday and friday and saturday states he could not talk. Maximum temperature: did not check. temperature at home. The patient is experiencing no pain. Associated symptoms include coughing, a hoarse voice and shortness of breath. Pertinent negatives include no abdominal pain, congestion, diarrhea, drooling, ear discharge, ear pain, headaches, plugged ear sensation, neck pain, stridor, swollen glands, trouble swallowing or vomiting. Associated symptoms comments: Cough dry and non-productive.. He has tried nothing for the symptoms. The treatment provided no relief.  Cough This is a new problem. Episode onset: 1 week. The problem has been gradually worsening. The problem occurs constantly. The cough is non-productive. Associated symptoms include chest pain, rhinorrhea, a sore throat, shortness of breath and wheezing. Pertinent negatives include no chills, ear congestion, ear pain, fever, headaches, nasal congestion, postnasal drip, rash, sweats or weight loss. Associated symptoms comments: Chest pain due to cough.. Nothing aggravates the symptoms. Risk factors for lung disease include smoking/tobacco exposure (Smokes 1 pack per day and has been smoking for 40 years.). He has tried nothing for the symptoms. The treatment provided no relief.   Patient states he checks his blood pressure at home and states it runs 120/80, but the last two visit his systolic has been over 150.  He just takes HCTZ 25mg  once daily.  He states he is taking that every day.  He denies chest pain, palpitations, dizziness, abdominal pain, nausea, vomiting, diarrhea and constipation.  Review of Systems  Constitutional: Positive for fatigue. Negative for fever, chills and weight loss.  HENT: Positive for hoarse voice, rhinorrhea, sinus pressure, sore  throat and voice change. Negative for congestion, drooling, ear discharge, ear pain, postnasal drip and trouble swallowing.   Eyes: Negative.   Respiratory: Positive for cough, chest tightness, shortness of breath and wheezing. Negative for stridor.        Chest pain from coughing.  Cardiovascular: Positive for chest pain.  Gastrointestinal: Negative.  Negative for vomiting, abdominal pain and diarrhea.  Endocrine: Negative.   Genitourinary: Negative.   Musculoskeletal: Negative.  Negative for neck pain.  Skin: Negative.  Negative for rash.  Neurological: Negative.  Negative for headaches.  Psychiatric/Behavioral: Negative.    Past Medical History  Diagnosis Date  . Hypertension   . Hyperlipidemia   . Type II or unspecified type diabetes mellitus without mention of complication, not stated as uncontrolled   . Vitamin D deficiency   . History of kidney stones   . Diabetic neuropathy   . Other testicular hypofunction   . History of hepatitis C    Current Outpatient Prescriptions on File Prior to Visit  Medication Sig Dispense Refill  . citalopram (CELEXA) 40 MG tablet TAKE ONE TABLET BY MOUTH ONCE DAILY FOR  MOOD 90 tablet 0  . cyclobenzaprine (FLEXERIL) 10 MG tablet Take 1 tablet (10 mg total) by mouth 3 (three) times daily as needed for muscle spasms. 90 tablet 1  . hydrochlorothiazide (HYDRODIURIL) 25 MG tablet TAKE ONE TABLET BY MOUTH ONCE DAILY 90 tablet 0  . HYDROcodone-acetaminophen (NORCO/VICODIN) 5-325 MG per tablet Take 1 tablet by mouth every 4 (four) hours as needed for moderate pain. 60 tablet 0  . INVOKAMET [phone] MG TABS TAKE ONE TABLET BY MOUTH TWICE DAILY WITH FOOD 60 tablet 3  . Vitamin D, Ergocalciferol, (DRISDOL) 50000 UNITS  CAPS capsule TAKE ONE CAPSULE BY MOUTH EVERY DAY 30 capsule 3   No current facility-administered medications on file prior to visit.   Allergies  Allergen Reactions  . Morphine And Related   . Codeine Camsylate [Codeine] Rash     BP  174/92 mmHg  Pulse 84  Temp(Src) 99.8 F (37.7 C) (Temporal)  Resp 18  Ht 6' 2.25" (1.886 m)  Wt 285 lb (129.275 kg)  BMI 36.34 kg/m2  SpO2 95% Wt Readings from Last 3 Encounters:  11/29/13 285 lb (129.275 kg)  08/31/13 279 lb 3.2 oz (126.644 kg)  05/19/13 276 lb 3.2 oz (125.283 kg)   Objective:   Physical Exam  Constitutional: He is oriented to person, place, and time. He appears well-developed and well-nourished. He has a sickly appearance. No distress.  HENT:  Head: Normocephalic.  Right Ear: Tympanic membrane, external ear and ear canal normal. No drainage, swelling or tenderness. Tympanic membrane is not injected, not erythematous, not retracted and not bulging.  Left Ear: Tympanic membrane, external ear and ear canal normal. No drainage, swelling or tenderness. Tympanic membrane is not injected, not erythematous, not retracted and not bulging.  Nose: Nose normal. Right sinus exhibits no maxillary sinus tenderness and no frontal sinus tenderness. Left sinus exhibits no maxillary sinus tenderness and no frontal sinus tenderness.  Mouth/Throat: Uvula is midline and mucous membranes are normal. Mucous membranes are not pale and not dry. No uvula swelling. Posterior oropharyngeal erythema present. No oropharyngeal exudate, posterior oropharyngeal edema or tonsillar abscesses.  Mild posterior oropharyngeal erythema.  Eyes: Conjunctivae, EOM and lids are normal. Pupils are equal, round, and reactive to light. Right eye exhibits no discharge. Left eye exhibits no discharge. No scleral icterus.  Neck: Trachea normal and normal range of motion. Neck supple. No tracheal tenderness present. No tracheal deviation present.  Mild hoarse voice.  Cardiovascular: Normal rate, regular rhythm, S1 normal, S2 normal, normal heart sounds and normal pulses.  Exam reveals no gallop, no distant heart sounds and no friction rub.   No murmur heard. Pulmonary/Chest: Effort normal. No accessory muscle usage or  stridor. No tachypnea and no bradypnea. No respiratory distress. He has no decreased breath sounds. He has wheezes in the right lower field and the left lower field. He has no rhonchi. He has no rales. He exhibits no tenderness.  Abdominal: Soft. Bowel sounds are normal. There is no tenderness. There is no rebound and no guarding.  Musculoskeletal: Normal range of motion.  Lymphadenopathy:       Head (right side): No submental, no submandibular, no tonsillar, no preauricular, no posterior auricular and no occipital adenopathy present.       Head (left side): No submental, no submandibular, no tonsillar, no preauricular, no posterior auricular and no occipital adenopathy present.    He has no cervical adenopathy.       Right: No supraclavicular adenopathy present.       Left: No supraclavicular adenopathy present.  Neurological: He is alert and oriented to person, place, and time. He has normal strength.  Skin: Skin is warm, dry and intact. No rash noted. He is not diaphoretic. No cyanosis. Nails show no clubbing.  Psychiatric: He has a normal mood and affect. His speech is normal and behavior is normal. Judgment and thought content normal. Cognition and memory are normal.      Assessment & Plan:   1. Acute bronchitis, unspecified organism -Take the Z-Pak as prescribed.- azithromycin (ZITHROMAX Z-PAK) 250 MG tablet; Take 2 tablets  PO on day 1, then take 1 tablet PO QDaily for 4 days.  Dispense: 6 tablet; Refill: 0 - Take the Ventolin inhaler as prescribed for SOB and wheezing-albuterol (VENTOLIN HFA) 108 (90 BASE) MCG/ACT inhaler; Inhale 2 puffs into the lungs every 4 (four) hours as needed for wheezing or shortness of breath.  Dispense: 1 Inhaler; Refill: 1 - Take the Promethazine-DM as prescribed-promethazine-dextromethorphan (PROMETHAZINE-DM) 6.25-15 MG/5ML syrup; Take 5 mLs by mouth 4 (four) times daily as needed for cough.  Dispense: 180 mL; Refill: 0 - DuoNeb given in office.-  ipratropium-albuterol (DUONEB) 0.5-2.5 (3) MG/3ML nebulizer solution 3 mL; Take 3 mLs by nebulization once.   2. Essential hypertension Your blood pressure is high.  Please start taking Lisinopril 10mg - 1 tablet by mouth daily along with hydrochlorothiazide 25mg  as prescribed.  Please check your blood pressure at home.  We want your blood pressure below 140/90.  Lisinopril - ACE inhibitors are blood pressure medications that protect your heart and kidneys. It can cause two symptoms: -The most common symptom is a dry cough/tickle in your throat that can happen the first day you take it or 5 years after you have been taking it. Please call us if you have this and we can switch it to a different medications. -The least common side effect is called angioedema which is swelling of your lips and tongue and can cause problems with your breathing. This is a very very rare side effect but very serious. If this happens please stop the medication and go to the ER.    - lisinopril (PRINIVIL) 10 MG tablet; Take 1 tablet (10 mg total) by mouth daily.  Dispense: 30 tablet; Refill: 0  Discussed medication effects and SE's.  Pt agreed to treatment plan discussed except for starting the Lisinopril at this time.  I explained to the patient that he has diabetes and HTN and the Lisinopril is an ACE inhibitor and will help protect his kidneys against diabetic nephropathy (which is the leading cause of end-stage renal disease in the [place]).  Also told patient that ACE inhibitors will help reduce proteinuria and improve kidney function as assessed by changes in creatinine clearance or glomerular filtration rate (GFR).  I sent in Lisinopril to the pharmacy and am leaving it up to the patient to take it or not.  Please keep your follow up appointment on 12/22/13 with Quentin Mulling, PA-C. If you are not feeling better in 7-10 days, then please call the office.  I would like to get a Chest x-ray to R/O pneumonia if you are not  feeling better.    [name], Lise Auer, PA-C 12:23 PM [facility]

## 2013-11-29 NOTE — Patient Instructions (Addendum)
- Take Z-Pak as prescribed. - Take Albuterol/Ventolin as prescribed for shortness of breath. -Take Promethazine-DM as prescribed for cough.  If you are not feeling better in 7-10 days, then please call the office.  I would like to get Chest X-ray to r/o pneumonia.  Your blood pressure is high.  Please start taking Lisinopril 10mg - 1 tablet by mouth daily along with hydrochlorothiazide 25mg  as prescribed.  Please check your blood pressure at home.  We want your blood pressure below 140/90.  Lisinopril - ACE inhibitors are blood pressure medications that protect your heart and kidneys. It can cause two symptoms: The most common symptom is a dry cough/tickle in your throat that can happen the first day you take it or 5 years after you have been taking it. Please call us if you have this and we can switch it to a different medications. The least common side effect is called angioedema which is swelling of your lips and tongue and can cause problems with your breathing. This is a very very rare side effect but very serious. If this happens please stop the medication and go to the ER.     Acute Bronchitis Bronchitis is when the airways that extend from the windpipe into the lungs get red, puffy, and painful (inflamed). Bronchitis often causes thick spit (mucus) to develop. This leads to a cough. A cough is the most common symptom of bronchitis. In acute bronchitis, the condition usually begins suddenly and goes away over time (usually in 2 weeks). Smoking, allergies, and asthma can make bronchitis worse. Repeated episodes of bronchitis may cause more lung problems. HOME CARE  Rest.  Drink enough fluids to keep your pee (urine) clear or pale yellow (unless you need to limit fluids as told by your doctor).  Only take over-the-counter or prescription medicines as told by your doctor.  Avoid smoking and secondhand smoke. These can make bronchitis worse. If you are a smoker, think about using nicotine  gum or skin patches. Quitting smoking will help your lungs heal faster.  Reduce the chance of getting bronchitis again by:  Washing your hands often.  Avoiding people with cold symptoms.  Trying not to touch your hands to your mouth, nose, or eyes.  Follow up with your doctor as told. GET HELP IF: Your symptoms do not improve after 1 week of treatment. Symptoms include:  Cough.  Fever.  Coughing up thick spit.  Body aches.  Chest congestion.  Chills.  Shortness of breath.  Sore throat. GET HELP RIGHT AWAY IF:   You have an increased fever.  You have chills.  You have severe shortness of breath.  You have bloody thick spit (sputum).  You throw up (vomit) often.  You lose too much body fluid (dehydration).  You have a severe headache.  You faint. MAKE SURE YOU:   Understand these instructions.  Will watch your condition.  Will get help right away if you are not doing well or get worse. Document Released: 06/26/2007 Document Revised: 09/09/2012 Document Reviewed: 06/30/2012 [facility] Patient Information 2015 [place], [place]. This information is not intended to replace advice given to you by your health care provider. Make sure you discuss any questions you have with your health care provider.  Hypertension Hypertension is another name for high blood pressure. High blood pressure forces your heart to work harder to pump blood. A blood pressure reading has two numbers, which includes a higher number over a lower number (example: 110/72). HOME CARE   Have  your blood pressure rechecked by your doctor.  Only take medicine as told by your doctor. Follow the directions carefully. The medicine does not work as well if you skip doses. Skipping doses also puts you at risk for problems.  Do not smoke.  Monitor your blood pressure at home as told by your doctor. GET HELP IF:  You think you are having a reaction to the medicine you are taking.  You have repeat  headaches or feel dizzy.  You have puffiness (swelling) in your ankles.  You have trouble with your vision. GET HELP RIGHT AWAY IF:   You get a very bad headache and are confused.  You feel weak, numb, or faint.  You get chest or belly (abdominal) pain.  You throw up (vomit).  You cannot breathe very well. MAKE SURE YOU:   Understand these instructions.  Will watch your condition.  Will get help right away if you are not doing well or get worse. Document Released: 06/26/2007 Document Revised: 01/12/2013 Document Reviewed: 10/30/2012 [facility] Patient Information 2015 [place], [place]. This information is not intended to replace advice given to you by your health care provider. Make sure you discuss any questions you have with your health care provider.

## 2013-12-01 ENCOUNTER — Other Ambulatory Visit: Payer: Self-pay | Admitting: Emergency Medicine

## 2013-12-22 ENCOUNTER — Ambulatory Visit (INDEPENDENT_AMBULATORY_CARE_PROVIDER_SITE_OTHER): Payer: BC Managed Care – PPO | Admitting: Physician Assistant

## 2013-12-22 ENCOUNTER — Encounter: Payer: Self-pay | Admitting: Physician Assistant

## 2013-12-22 VITALS — BP 128/78 | HR 76 | Temp 98.1°F | Resp 16 | Ht 74.25 in | Wt 280.0 lb

## 2013-12-22 DIAGNOSIS — F17200 Nicotine dependence, unspecified, uncomplicated: Secondary | ICD-10-CM

## 2013-12-22 DIAGNOSIS — E785 Hyperlipidemia, unspecified: Secondary | ICD-10-CM

## 2013-12-22 DIAGNOSIS — E291 Testicular hypofunction: Secondary | ICD-10-CM

## 2013-12-22 DIAGNOSIS — E559 Vitamin D deficiency, unspecified: Secondary | ICD-10-CM

## 2013-12-22 DIAGNOSIS — Z79899 Other long term (current) drug therapy: Secondary | ICD-10-CM

## 2013-12-22 DIAGNOSIS — Z8619 Personal history of other infectious and parasitic diseases: Secondary | ICD-10-CM

## 2013-12-22 DIAGNOSIS — N182 Chronic kidney disease, stage 2 (mild): Secondary | ICD-10-CM

## 2013-12-22 DIAGNOSIS — E11610 Type 2 diabetes mellitus with diabetic neuropathic arthropathy: Secondary | ICD-10-CM

## 2013-12-22 DIAGNOSIS — E1122 Type 2 diabetes mellitus with diabetic chronic kidney disease: Secondary | ICD-10-CM

## 2013-12-22 DIAGNOSIS — I10 Essential (primary) hypertension: Secondary | ICD-10-CM

## 2013-12-22 LAB — HEPATIC FUNCTION PANEL
ALBUMIN: 4 g/dL (ref 3.5–5.2)
ALT: 37 U/L (ref 0–53)
AST: 29 U/L (ref 0–37)
Alkaline Phosphatase: 84 U/L (ref 39–117)
Bilirubin, Direct: 0.1 mg/dL (ref 0.0–0.3)
Indirect Bilirubin: 0.3 mg/dL (ref 0.2–1.2)
Total Bilirubin: 0.4 mg/dL (ref 0.2–1.2)
Total Protein: 7.1 g/dL (ref 6.0–8.3)

## 2013-12-22 LAB — BASIC METABOLIC PANEL WITH GFR
BUN: 14 mg/dL (ref 6–23)
CALCIUM: 9.4 mg/dL (ref 8.4–10.5)
CO2: 29 mEq/L (ref 19–32)
CREATININE: 0.79 mg/dL (ref 0.50–1.35)
Chloride: 99 mEq/L (ref 96–112)
GLUCOSE: 176 mg/dL — AB (ref 70–99)
Potassium: 4.5 mEq/L (ref 3.5–5.3)
Sodium: 137 mEq/L (ref 135–145)

## 2013-12-22 LAB — LIPID PANEL
Cholesterol: 177 mg/dL (ref 0–200)
HDL: 30 mg/dL — AB (ref >39)
LDL CALC: 67 mg/dL (ref 0–99)
TRIGLYCERIDES: 398 mg/dL — AB (ref <150)
Total CHOL/HDL Ratio: 5.9 Ratio
VLDL: 80 mg/dL — ABNORMAL HIGH (ref 0–40)

## 2013-12-22 LAB — MAGNESIUM: MAGNESIUM: 2.1 mg/dL (ref 1.5–2.5)

## 2013-12-22 MED ORDER — HYDROCODONE-ACETAMINOPHEN 5-325 MG PO TABS: 1.0000 | ORAL_TABLET | ORAL | Status: DC | PRN

## 2013-12-22 NOTE — Patient Instructions (Signed)
Please stop the HCTZ/fluid pill and start the lisinopril that was send in. this medicaiton protects your kidney and heart. If you get swelling in your feet add back on the HCTZ EVERY OTHER DAY.   ACE inhibitors are blood pressure medications that protect your heart and kidneys. It can cause two symptoms: The most common symptom is a dry cough/tickle in your throat that can happen the first day you take it or 5 years after you have been taking it. Please call us if you have this and we can switch it to a different medications. The least common side effect is called angioedema which is swelling of your lips and tongue and can cause problems with your breathing. This is a very very rare side effect but very serious. If this happens please stop the medication and go to the ER.   Benefiber is good for constipation/diarrhea/irritable bowel syndrome, it helps with weight loss and can help lower your bad cholesterol. Please do 1-2 TBSP in the morning in water, coffee, or tea. It can take up to a month before you can see a difference with your bowel movements. It is cheapest from [employer], [employer], [employer].   Triglycerides are simple fats in blood that are converted into a storage form. I recommend you avoid fried/greasy foods, sweets/candy, white rice , white potatoes,  anything made from white flour, sweet tea, soda, fruit juices and avoid alcohol in excess. Sweet potatoes, brown/wild rice/Quinoa, Vegetarian, spinach, or wheat pasta, Multi-grain bread - like multi-grain flat bread or sandwich thins are okay.       Bad carbs also include fruit juice, alcohol, and sweet tea. These are empty calories that do not signal to your brain that you are full.   Please remember the good carbs are still carbs which convert into sugar. So please measure them out no more than 1/2-1 cup of rice, oatmeal, pasta, and beans  Veggies are however free foods! Pile them on.   Not all fruit is created equal. Please see the list  below, the fruit at the bottom is higher in sugars than the fruit at the top. Please avoid all dried fruits.

## 2013-12-22 NOTE — Progress Notes (Signed)
Assessment and Plan:  Hypertension: Continue medication, monitor blood pressure at home. Continue DASH diet.  Reminder to go to the ER if any CP, SOB, nausea, dizziness, severe HA, changes vision/speech, left arm numbness and tingling and jaw pain. Cholesterol: Continue diet and exercise. Check cholesterol.  Diabetes with p.neuropathy and CKD-Continue diet and exercise. Check A1C, add bASA, stop HCTZ and start lisinopril  Vitamin D Def- check level and continue medications.  Pain- refill hydrocodone, last filled in March #60 and just out, takes rarely Obesity with co morbidities- long discussion about weight loss, diet, and exercise Smoking cessation- discussed with patient, understands risk of MI, stroke, cancer, and death. Some continuing wheezing, will get CXR, given MDI for albuterol.    Continue diet and meds as discussed. Further disposition pending results of labs. Discussed med's effects and SE's.   HPI 53 y.o. male  presents for 3 month follow up with hypertension, hyperlipidemia, diabetes and vitamin D. His blood pressure has been controlled at home, today their BP is BP: 128/78 mmHg He does not workout but is active at work. He denies chest pain, shortness of breath, dizziness.  He is not on cholesterol medication and denies myalgias. His cholesterol is at goal. The cholesterol was:  08/31/2013: Cholesterol, Total 218*; HDL Cholesterol by NMR 29*; LDL (calc) NOT CALC; Triglycerides 461* He has been working on diet and exercise for Diabetes with p. neuropathy and CKD, he is on invokamet but not on ACE/ARB/bASA, and denies polydipsia, polyuria and visual disturbances. Last A1C  was: 08/31/2013: Hemoglobin-A1c 6.6* Patient is on Vitamin D supplement. 08/31/2013: Vit D, 25-Hydroxy 77  Came in for a bronchitis, has taken Zpak and states he is doing better.  BMI is Body mass index is 35.71 kg/(m^2)., he is working on diet and exercise.  Wt Readings from Last 3 Encounters:  12/22/13 280 lb  (127.007 kg)  11/29/13 285 lb (129.275 kg)  08/31/13 279 lb 3.2 oz (126.644 kg)   Current Medications:  Current Outpatient Prescriptions on File Prior to Visit  Medication Sig Dispense Refill  . albuterol (VENTOLIN HFA) 108 (90 BASE) MCG/ACT inhaler Inhale 2 puffs into the lungs every 4 (four) hours as needed for wheezing or shortness of breath. 1 Inhaler 1  . azithromycin (ZITHROMAX Z-PAK) 250 MG tablet Take 2 tablets PO on day 1, then take 1 tablet PO QDaily for 4 days. 6 tablet 0  . citalopram (CELEXA) 40 MG tablet TAKE ONE TABLET BY MOUTH ONCE DAILY FOR  MOOD 90 tablet 0  . cyclobenzaprine (FLEXERIL) 10 MG tablet Take 1 tablet (10 mg total) by mouth 3 (three) times daily as needed for muscle spasms. 90 tablet 1  . hydrochlorothiazide (HYDRODIURIL) 25 MG tablet TAKE ONE TABLET BY MOUTH ONCE DAILY 90 tablet 0  . HYDROcodone-acetaminophen (NORCO/VICODIN) 5-325 MG per tablet Take 1 tablet by mouth every 4 (four) hours as needed for moderate pain. 60 tablet 0  . INVOKAMET [id number] MG TABS TAKE ONE TABLET BY MOUTH TWICE DAILY WITH FOOD 60 tablet 3  . lisinopril (PRINIVIL) 10 MG tablet Take 1 tablet (10 mg total) by mouth daily. 30 tablet 0  . promethazine-dextromethorphan (PROMETHAZINE-DM) 6.25-15 MG/5ML syrup Take 5 mLs by mouth 4 (four) times daily as needed for cough. 180 mL 0  . Vitamin D, Ergocalciferol, (DRISDOL) 50000 UNITS CAPS capsule TAKE ONE CAPSULE BY MOUTH ONCE DAILY 30 capsule 5   No current facility-administered medications on file prior to visit.   Medical History:  Past Medical History  Diagnosis Date  . Hypertension   . Hyperlipidemia   . Type II or unspecified type diabetes mellitus without mention of complication, not stated as uncontrolled   . Vitamin D deficiency   . History of kidney stones   . Diabetic neuropathy   . Other testicular hypofunction   . History of hepatitis C    Allergies:  Allergies  Allergen Reactions  . Morphine And Related   . Codeine  Camsylate [Codeine] Rash    Review of Systems  Constitutional: Negative for fever and chills.  Respiratory: Positive for cough and wheezing. Negative for hemoptysis, sputum production and shortness of breath.   Cardiovascular: Negative for chest pain, palpitations, orthopnea, claudication, leg swelling and PND.  Genitourinary: Positive for frequency.  Neurological: Positive for sensory change. Negative for dizziness, tingling, tremors, speech change, focal weakness, seizures and loss of consciousness.  All other systems reviewed and are negative.  Family history- Review and unchanged Social history- Review and unchanged Physical Exam: BP 128/78 mmHg  Pulse 76  Temp(Src) 98.1 F (36.7 C)  Resp 16  Ht 6' 2.25" (1.886 m)  Wt 280 lb (127.007 kg)  BMI 35.71 kg/m2 Wt Readings from Last 3 Encounters:  12/22/13 280 lb (127.007 kg)  11/29/13 285 lb (129.275 kg)  08/31/13 279 lb 3.2 oz (126.644 kg)   General Appearance: Well nourished, in no apparent distress. Eyes: PERRLA, EOMs, conjunctiva no swelling or erythema Sinuses: No Frontal/maxillary tenderness ENT/Mouth: Ext aud canals clear, TMs without erythema, bulging. No erythema, swelling, or exudate on post pharynx.  Tonsils not swollen or erythematous. Hearing normal.  Neck: Supple, thyroid normal.  Respiratory: Respiratory effort normal, BS equal bilaterally with wheezing/rhonchi without rales, rhonchi, or stridor.  Cardio: RRR with no MRGs. Brisk peripheral pulses without edema.  Abdomen: Soft, + BS, obese  Non tender, no guarding, rebound, hernias, masses. Lymphatics: Non tender without lymphadenopathy.  Musculoskeletal: Full ROM, 5/5 strength, normal gait.  Skin: Warm, dry without rashes, lesions, ecchymosis.  Neuro: Cranial nerves intact. No cerebellar symptoms. Sensation intact.  Psych: Awake and oriented X 3, normal affect, Insight and Judgment appropriate.    Quentin Mullingollier, Kenidee Cregan, PA-C 9:47 AM [facility]

## 2013-12-23 LAB — CBC WITH DIFFERENTIAL/PLATELET
BASOS ABS: 0.1 10*3/uL (ref 0.0–0.1)
BASOS PCT: 1 % (ref 0–1)
EOS ABS: 0.4 10*3/uL (ref 0.0–0.7)
EOS PCT: 4 % (ref 0–5)
HCT: 49.4 % (ref 39.0–52.0)
Hemoglobin: 17.3 g/dL — ABNORMAL HIGH (ref 13.0–17.0)
LYMPHS PCT: 37 % (ref 12–46)
Lymphs Abs: 3.4 10*3/uL (ref 0.7–4.0)
MCH: 32.9 pg (ref 26.0–34.0)
MCHC: 35 g/dL (ref 30.0–36.0)
MCV: 93.9 fL (ref 78.0–100.0)
MONO ABS: 0.8 10*3/uL (ref 0.1–1.0)
MPV: 11.6 fL (ref 9.4–12.4)
Monocytes Relative: 9 % (ref 3–12)
Neutro Abs: 4.5 10*3/uL (ref 1.7–7.7)
Neutrophils Relative %: 49 % (ref 43–77)
PLATELETS: 227 10*3/uL (ref 150–400)
RBC: 5.26 MIL/uL (ref 4.22–5.81)
RDW: 14 % (ref 11.5–15.5)
WBC: 9.2 10*3/uL (ref 4.0–10.5)

## 2013-12-23 LAB — HEMOGLOBIN A1C
Hgb A1c MFr Bld: 7.1 % — ABNORMAL HIGH (ref <5.7)
MEAN PLASMA GLUCOSE: 157 mg/dL — AB (ref <117)

## 2013-12-23 LAB — TSH: TSH: 1.323 u[IU]/mL (ref 0.350–4.500)

## 2013-12-23 LAB — VITAMIN D 25 HYDROXY (VIT D DEFICIENCY, FRACTURES): Vit D, 25-Hydroxy: 51 ng/mL (ref 30–100)

## 2013-12-30 ENCOUNTER — Other Ambulatory Visit: Payer: Self-pay | Admitting: *Deleted

## 2013-12-30 MED ORDER — CITALOPRAM HYDROBROMIDE 40 MG PO TABS: ORAL_TABLET | ORAL | Status: DC

## 2014-01-25 ENCOUNTER — Other Ambulatory Visit: Payer: Self-pay | Admitting: Physician Assistant

## 2014-03-21 ENCOUNTER — Other Ambulatory Visit: Payer: Self-pay | Admitting: Internal Medicine

## 2014-04-01 ENCOUNTER — Encounter: Payer: Self-pay | Admitting: Internal Medicine

## 2014-04-01 ENCOUNTER — Ambulatory Visit (INDEPENDENT_AMBULATORY_CARE_PROVIDER_SITE_OTHER): Payer: BLUE CROSS/BLUE SHIELD | Admitting: Internal Medicine

## 2014-04-01 VITALS — BP 128/80 | HR 84 | Temp 98.6°F | Resp 16 | Ht 74.25 in | Wt 275.4 lb

## 2014-04-01 DIAGNOSIS — E1122 Type 2 diabetes mellitus with diabetic chronic kidney disease: Secondary | ICD-10-CM

## 2014-04-01 DIAGNOSIS — E1140 Type 2 diabetes mellitus with diabetic neuropathy, unspecified: Secondary | ICD-10-CM | POA: Insufficient documentation

## 2014-04-01 DIAGNOSIS — E785 Hyperlipidemia, unspecified: Secondary | ICD-10-CM

## 2014-04-01 DIAGNOSIS — N182 Chronic kidney disease, stage 2 (mild): Secondary | ICD-10-CM

## 2014-04-01 DIAGNOSIS — E559 Vitamin D deficiency, unspecified: Secondary | ICD-10-CM

## 2014-04-01 DIAGNOSIS — Z79899 Other long term (current) drug therapy: Secondary | ICD-10-CM

## 2014-04-01 DIAGNOSIS — E291 Testicular hypofunction: Secondary | ICD-10-CM

## 2014-04-01 DIAGNOSIS — I10 Essential (primary) hypertension: Secondary | ICD-10-CM

## 2014-04-01 DIAGNOSIS — E1149 Type 2 diabetes mellitus with other diabetic neurological complication: Secondary | ICD-10-CM

## 2014-04-01 LAB — CBC WITH DIFFERENTIAL/PLATELET
BASOS ABS: 0.1 10*3/uL (ref 0.0–0.1)
Basophils Relative: 1 % (ref 0–1)
EOS PCT: 1 % (ref 0–5)
Eosinophils Absolute: 0.1 10*3/uL (ref 0.0–0.7)
HCT: 50.1 % (ref 39.0–52.0)
HEMOGLOBIN: 17.2 g/dL — AB (ref 13.0–17.0)
Lymphocytes Relative: 35 % (ref 12–46)
Lymphs Abs: 3.2 10*3/uL (ref 0.7–4.0)
MCH: 32.3 pg (ref 26.0–34.0)
MCHC: 34.3 g/dL (ref 30.0–36.0)
MCV: 94.2 fL (ref 78.0–100.0)
MONO ABS: 0.8 10*3/uL (ref 0.1–1.0)
MPV: 11.6 fL (ref 8.6–12.4)
Monocytes Relative: 9 % (ref 3–12)
NEUTROS ABS: 4.9 10*3/uL (ref 1.7–7.7)
Neutrophils Relative %: 54 % (ref 43–77)
PLATELETS: 220 10*3/uL (ref 150–400)
RBC: 5.32 MIL/uL (ref 4.22–5.81)
RDW: 13.8 % (ref 11.5–15.5)
WBC: 9 10*3/uL (ref 4.0–10.5)

## 2014-04-01 LAB — LIPID PANEL
CHOLESTEROL: 179 mg/dL (ref 0–200)
HDL: 24 mg/dL — ABNORMAL LOW (ref >=40)
LDL Cholesterol: 90 mg/dL (ref 0–99)
TRIGLYCERIDES: 325 mg/dL — AB (ref <150)
Total CHOL/HDL Ratio: 7.5 Ratio
VLDL: 65 mg/dL — ABNORMAL HIGH (ref 0–40)

## 2014-04-01 LAB — HEPATIC FUNCTION PANEL
ALBUMIN: 4 g/dL (ref 3.5–5.2)
ALK PHOS: 69 U/L (ref 39–117)
ALT: 33 U/L (ref 0–53)
AST: 24 U/L (ref 0–37)
BILIRUBIN DIRECT: 0.1 mg/dL (ref 0.0–0.3)
BILIRUBIN TOTAL: 0.6 mg/dL (ref 0.2–1.2)
Indirect Bilirubin: 0.5 mg/dL (ref 0.2–1.2)
Total Protein: 7.2 g/dL (ref 6.0–8.3)

## 2014-04-01 LAB — HEMOGLOBIN A1C
HEMOGLOBIN A1C: 6.7 % — AB (ref <5.7)
Mean Plasma Glucose: 146 mg/dL — ABNORMAL HIGH (ref <117)

## 2014-04-01 LAB — BASIC METABOLIC PANEL WITH GFR
BUN: 20 mg/dL (ref 6–23)
CALCIUM: 9.1 mg/dL (ref 8.4–10.5)
CHLORIDE: 101 meq/L (ref 96–112)
CO2: 23 mEq/L (ref 19–32)
CREATININE: 0.92 mg/dL (ref 0.50–1.35)
GFR, Est African American: >89 mL/min
Glucose, Bld: 141 mg/dL — ABNORMAL HIGH (ref 70–99)
Potassium: 4.2 mEq/L (ref 3.5–5.3)
Sodium: 137 mEq/L (ref 135–145)

## 2014-04-01 LAB — TSH: TSH: 2.013 u[IU]/mL (ref 0.350–4.500)

## 2014-04-01 LAB — MAGNESIUM: Magnesium: 2.1 mg/dL (ref 1.5–2.5)

## 2014-04-01 NOTE — Progress Notes (Signed)
Patient ID: Michael CommonRobert C Arellano, male   DOB: 01/20/1961, 54 y.o.   MRN: [id number]   This very nice 54 y.o.male presents for 3 month follow up with Hypertension, Hyperlipidemia, T@2_NIDDM  and Vitamin D Deficiency.    Patient is treated for HTN since 1990 & BP has been controlled at home. Today's BP: 128/80 mmHg. Patient has had no complaints of any cardiac type chest pain, palpitations, dyspnea/orthopnea/PND, dizziness, claudication, or dependent edema.   Hyperlipidemia is controlled with diet & meds. Patient denies myalgias or other med SE's. Last Lipids were at goal Total Chol  177; HDL 30*; LDL 67; with elevated Triglycerides 398 on 12/22/2013.   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  Last A1c was  7.1% on 12/22/2013.   Further, the patient also has history of Vitamin D Deficiency of 24 in 2008 and supplements vitamin D without any suspected side-effects. Last vitamin D was  51 on  12/22/2013.  Medication Sig  . albuterol (VENTOLIN HFA) 108 (90 BASE) MCG/ACT inhaler Inhale 2 puffs into the lungs every 4 (four) hours as needed for wheezing or shortness of breath.  . citalopram (CELEXA) 40 MG tablet TAKE ONE TABLET BY MOUTH ONCE DAILY FOR  MOOD  . cyclobenzaprine (FLEXERIL) 10 MG tablet Take 1 tablet (10 mg total) by mouth 3 (three) times daily as needed for muscle spasms.  . hydrochlorothiazide (HYDRODIURIL) 25 MG tablet TAKE ONE TABLET BY MOUTH ONCE DAILY  . HYDROcodone-acetaminophen (NORCO/VICODIN) 5-325 MG per tablet Take 1 tablet by mouth every 4 (four) hours as needed for moderate pain.  . INVOKAMET [id number] MG TABS TAKE ONE TABLET BY MOUTH TWICE DAILY WITH FOOD  . lisinopril (PRINIVIL,ZESTRIL) 10 MG tablet TAKE ONE TABLET BY MOUTH ONCE DAILY  . Vitamin  D, Ergocalciferol, (DRISDOL) 50000 UNITS CAPS capsule TAKE ONE CAPSULE BY MOUTH ONCE DAILY   Allergies  Allergen Reactions  . Morphine And Related   . Codeine Camsylate [Codeine] Rash   PMHx:   Past Medical History  Diagnosis Date  . Hypertension   . Hyperlipidemia   . Type II or unspecified type diabetes mellitus without mention of complication, not stated as uncontrolled   . Vitamin D deficiency   . History of kidney stones   . Diabetic neuropathy   . Other testicular hypofunction   . History of hepatitis C    Immunization History  Administered Date(s) Administered  . PPD Test 08/31/2013  . Td 01/21/2005   Past Surgical History  Procedure Laterality Date  . Cholecystectomy  1987  . Orif tibia fracture     FHx:    Reviewed / unchanged  SHx:    Reviewed / unchanged  Systems Review:  Constitutional: Denies fever, chills, wt changes, headaches, insomnia, fatigue, night sweats, change in appetite. Eyes: Denies redness, blurred vision, diplopia, discharge, itchy, watery eyes.  ENT: Denies discharge, congestion, post nasal drip, epistaxis, sore throat, earache, hearing loss, dental pain, tinnitus, vertigo, sinus pain, snoring.  CV: Denies chest pain, palpitations, irregular heartbeat, syncope, dyspnea, diaphoresis, orthopnea, PND, claudication or edema. Respiratory: denies cough, dyspnea, DOE, pleurisy, hoarseness, laryngitis, wheezing.  Gastrointestinal: Denies dysphagia, odynophagia, heartburn, reflux, water brash, abdominal pain or cramps, nausea, vomiting, bloating, diarrhea, constipation, hematemesis, melena, hematochezia  or hemorrhoids. Genitourinary: Denies dysuria,  frequency, urgency, nocturia, hesitancy, discharge, hematuria or flank pain. Musculoskeletal: Denies arthralgias, myalgias, stiffness, jt. swelling, pain, limping or strain/sprain.  Skin: Denies pruritus, rash, hives, warts, acne, eczema or change in skin lesion(s). Neuro: No weakness, tremor, incoordination,  spasms, paresthesia or pain. Psychiatric: Denies confusion, memory loss or sensory loss. Endo: Denies change in weight, skin or hair change.  Heme/Lymph: No excessive bleeding, bruising or enlarged lymph nodes.  Physical Exam  BP 128/80   Pulse 84  Temp 98.6 F   Resp 16  Ht 6' 2.25"   Wt 275 lb 6.4 oz    BMI 35.12  Appears well nourished and in no distress. Eyes: PERRLA, EOMs, conjunctiva no swelling or erythema. Sinuses: No frontal/maxillary tenderness ENT/Mouth: EAC's clear, TM's nl w/o erythema, bulging. Nares clear w/o erythema, swelling, exudates. Oropharynx clear without erythema or exudates. Oral hygiene is good. Tongue normal, non obstructing. Hearing intact.  Neck: Supple. Thyroid nl. Car 2+/2+ without bruits, nodes or JVD. Chest: Respirations nl with BS clear & equal w/o rales, rhonchi, wheezing or stridor.  Cor: Heart sounds normal w/ regular rate and rhythm without sig. murmurs, gallops, clicks, or rubs. Peripheral pulses normal and equal  without edema.  Abdomen: Soft & bowel sounds normal. Non-tender w/o guarding, rebound, hernias, masses, or organomegaly.  Lymphatics: Unremarkable.  Musculoskeletal: Full ROM all peripheral extremities, joint stability, 5/5 strength, and normal gait.  Skin: Warm, dry without exposed rashes, lesions or ecchymosis apparent.  Neuro: Cranial nerves intact, reflexes equal bilaterally. Sensory-motor testing grossly intact. Tendon reflexes grossly intact.  Pysch: Alert & oriented x 3.  Insight and judgement nl & appropriate. No ideations.  Assessment and Plan:  1. Essential hypertension  - TSH  2. Hyperlipidemia  - Lipid panel  3. T2_NIDDM w/ CKD  - Hemoglobin A1c - Vit D  25 hydroxy (rtn osteoporosis monitoring)  4. Vitamin D deficiency  - Insulin, random  5. Hypogonadism male   6. Medication management  - CBC with Differential/Platelet - BASIC METABOLIC PANEL WITH GFR - Hepatic function panel -  Magnesium   Recommended regular exercise, BP monitoring, weight control, and discussed med and SE's. Recommended labs to assess and monitor clinical status. Further disposition pending results of labs.

## 2014-04-01 NOTE — Patient Instructions (Signed)

## 2014-04-02 LAB — VITAMIN D 25 HYDROXY (VIT D DEFICIENCY, FRACTURES): Vit D, 25-Hydroxy: 58 ng/mL (ref 30–100)

## 2014-04-02 LAB — INSULIN, RANDOM: Insulin: 38.6 u[IU]/mL — ABNORMAL HIGH (ref 2.0–19.6)

## 2014-05-06 ENCOUNTER — Other Ambulatory Visit: Payer: Self-pay | Admitting: Internal Medicine

## 2014-05-08 ENCOUNTER — Other Ambulatory Visit: Payer: Self-pay | Admitting: Internal Medicine

## 2014-06-08 ENCOUNTER — Other Ambulatory Visit: Payer: Self-pay | Admitting: Internal Medicine

## 2014-06-08 DIAGNOSIS — I10 Essential (primary) hypertension: Secondary | ICD-10-CM

## 2014-07-07 ENCOUNTER — Ambulatory Visit: Payer: Self-pay | Admitting: Internal Medicine

## 2014-08-02 ENCOUNTER — Ambulatory Visit (INDEPENDENT_AMBULATORY_CARE_PROVIDER_SITE_OTHER): Payer: 59 | Admitting: Physician Assistant

## 2014-08-02 ENCOUNTER — Ambulatory Visit: Payer: Self-pay | Admitting: Internal Medicine

## 2014-08-02 ENCOUNTER — Encounter: Payer: Self-pay | Admitting: Physician Assistant

## 2014-08-02 VITALS — BP 138/78 | HR 80 | Temp 97.7°F | Resp 16 | Ht 74.25 in | Wt 281.0 lb

## 2014-08-02 DIAGNOSIS — I10 Essential (primary) hypertension: Secondary | ICD-10-CM

## 2014-08-02 DIAGNOSIS — E1122 Type 2 diabetes mellitus with diabetic chronic kidney disease: Secondary | ICD-10-CM

## 2014-08-02 DIAGNOSIS — E785 Hyperlipidemia, unspecified: Secondary | ICD-10-CM

## 2014-08-02 DIAGNOSIS — E559 Vitamin D deficiency, unspecified: Secondary | ICD-10-CM

## 2014-08-02 DIAGNOSIS — N182 Chronic kidney disease, stage 2 (mild): Secondary | ICD-10-CM

## 2014-08-02 DIAGNOSIS — E11610 Type 2 diabetes mellitus with diabetic neuropathic arthropathy: Secondary | ICD-10-CM

## 2014-08-02 DIAGNOSIS — Z79899 Other long term (current) drug therapy: Secondary | ICD-10-CM

## 2014-08-02 DIAGNOSIS — F17200 Nicotine dependence, unspecified, uncomplicated: Secondary | ICD-10-CM

## 2014-08-02 MED ORDER — AMITRIPTYLINE HCL 50 MG PO TABS: ORAL_TABLET | ORAL | Status: DC

## 2014-08-02 NOTE — Progress Notes (Signed)
Assessment and Plan:  Hypertension: Continue medication, monitor blood pressure at home. Continue DASH diet.  Reminder to go to the ER if any CP, SOB, nausea, dizziness, severe HA, changes vision/speech, left arm numbness and tingling and jaw pain. Cholesterol: Continue diet and exercise. Check cholesterol.  Diabetes with p.neuropathy and CKD-Continue diet and exercise. Check A1C, add bASA, continue ACE, start on ASA, will add on elavil 50mg  1/2-1 pill at night.  Vitamin D Def- check level and continue medications.  Obesity with co morbidities- long discussion about weight loss, diet, and exercise Smoking cessation- has not smoked for 73 days.   Continue diet and meds as discussed. Further disposition pending results of labs. Discussed med's effects and SE's.   HPI 54 y.o. male  presents for 3 month follow up with hypertension, hyperlipidemia, diabetes and vitamin D. His blood pressure has been controlled at home, today their BP is BP: 138/78 mmHg He does not workout but is active at work. He denies chest pain, shortness of breath, dizziness.  He is not on cholesterol medication and denies myalgias. His cholesterol is at goal. The cholesterol was:  04/01/2014: Cholesterol 179; HDL 24*; LDL Cholesterol 90; Triglycerides 325* He has been working on diet and exercise for Diabetes with p. neuropathy and CKD, starting to complain of bilateral hand numbness, he is on invokamet but he is on ACE/ARB, not on bASA, and denies polydipsia, polyuria and visual disturbances. Last A1C  was: 04/01/2014: Hgb A1c MFr Bld 6.7* Patient is on Vitamin D supplement. 04/01/2014: Vit D, 25-Hydroxy 58  BMI is Body mass index is 35.83 kg/(m^2)., he is working on diet and exercise.  Wt Readings from Last 3 Encounters:  08/02/14 281 lb (127.461 kg)  04/01/14 275 lb 6.4 oz (124.921 kg)  12/22/13 280 lb (127.007 kg)   Current Medications:  Current Outpatient Prescriptions on File Prior to Visit  Medication Sig Dispense  Refill  . albuterol (VENTOLIN HFA) 108 (90 BASE) MCG/ACT inhaler Inhale 2 puffs into the lungs every 4 (four) hours as needed for wheezing or shortness of breath. 1 Inhaler 1  . citalopram (CELEXA) 40 MG tablet TAKE ONE TABLET BY MOUTH ONCE DAILY FOR  MOOD 90 tablet 3  . cyclobenzaprine (FLEXERIL) 10 MG tablet Take 1 tablet (10 mg total) by mouth 3 (three) times daily as needed for muscle spasms. 90 tablet 1  . hydrochlorothiazide (HYDRODIURIL) 25 MG tablet TAKE ONE TABLET BY MOUTH ONCE DAILY. 90 tablet 0  . HYDROcodone-acetaminophen (NORCO/VICODIN) 5-325 MG per tablet Take 1 tablet by mouth every 4 (four) hours as needed for moderate pain. 60 tablet 0  . INVOKAMET [phone] MG TABS TAKE ONE TABLET BY MOUTH TWICE DAILY WITH FOOD 60 tablet 5  . lisinopril (PRINIVIL,ZESTRIL) 10 MG tablet TAKE ONE TABLET BY MOUTH ONCE DAILY. 90 tablet 1  . Vitamin D, Ergocalciferol, (DRISDOL) 50000 UNITS CAPS capsule TAKE ONE CAPSULE BY MOUTH ONCE DAILY 30 capsule 5   No current facility-administered medications on file prior to visit.   Medical History:  Past Medical History  Diagnosis Date  . Hypertension   . Hyperlipidemia   . Type II or unspecified type diabetes mellitus without mention of complication, not stated as uncontrolled   . Vitamin D deficiency   . History of kidney stones   . Diabetic neuropathy   . Other testicular hypofunction   . History of hepatitis C    Allergies:  Allergies  Allergen Reactions  . Morphine And Related   . Codeine Camsylate [  Codeine] Rash    Review of Systems  Constitutional: Negative for fever and chills.  Respiratory: Negative for cough, hemoptysis, sputum production, shortness of breath and wheezing.   Cardiovascular: Negative for chest pain, palpitations, orthopnea, claudication, leg swelling and PND.  Genitourinary: Negative for frequency.  Neurological: Positive for sensory change. Negative for dizziness, tingling, tremors, speech change, focal weakness,  seizures and loss of consciousness.  All other systems reviewed and are negative.  Family history- Review and unchanged Social history- Review and unchanged Physical Exam: BP 138/78 mmHg  Pulse 80  Temp(Src) 97.7 F (36.5 C)  Resp 16  Ht 6' 2.25" (1.886 m)  Wt 281 lb (127.461 kg)  BMI 35.83 kg/m2 Wt Readings from Last 3 Encounters:  08/02/14 281 lb (127.461 kg)  04/01/14 275 lb 6.4 oz (124.921 kg)  12/22/13 280 lb (127.007 kg)   General Appearance: Well nourished, in no apparent distress. Eyes: PERRLA, EOMs, conjunctiva no swelling or erythema Sinuses: No Frontal/maxillary tenderness ENT/Mouth: Ext aud canals clear, TMs without erythema, bulging. No erythema, swelling, or exudate on post pharynx.  Tonsils not swollen or erythematous. Hearing normal.  Neck: Supple, thyroid normal.  Respiratory: Respiratory effort normal, BS equal bilaterally with wheezing/rhonchi without rales, rhonchi, or stridor.  Cardio: RRR with no MRGs. Decreased bilateral peripheral pulses without edema.  Abdomen: Soft, + BS, obese  Non tender, no guarding, rebound, hernias, masses. Lymphatics: Non tender without lymphadenopathy.  Musculoskeletal: Full ROM, 5/5 strength, normal gait.  Skin: Warm, dry without rashes, lesions, ecchymosis.  Neuro: Cranial nerves intact. No cerebellar symptoms. Sensation decreased bilateral legs to knees. Negative phalens and tinels sign, good bilateral strength.  Psych: Awake and oriented X 3, normal affect, Insight and Judgment appropriate.    Quentin Mulling, PA-C 4:27 PM [facility]

## 2014-08-02 NOTE — Patient Instructions (Signed)
Can do elavil, start 1/2 pill at night for 3-5 nights, and can go to  at night, can go up to  total a day.   Add ENTERIC COATED low dose 81 mg Aspirin daily OR can do every other day if you have easy bruising to protect your heart and head. As well as to reduce risk of Colon Cancer by 20 %, Skin Cancer by 26 % , Melanoma by 46% and Pancreatic cancer by 60%  Diabetic Neuropathy Diabetic neuropathy is a nerve disease or nerve damage that is caused by diabetes mellitus. About half of all people with diabetes mellitus have some form of nerve damage. Nerve damage is more common in those who have had diabetes mellitus for many years and who generally have not had good control of their blood sugar (glucose) level. Diabetic neuropathy is a common complication of diabetes mellitus. There are three more common types of diabetic neuropathy and a fourth type that is less common and less understood:   Peripheral neuropathy--This is the most common type of diabetic neuropathy. It causes damage to the nerves of the feet and legs first and then eventually the hands and arms.The damage affects the ability to sense touch.  Autonomic neuropathy--This type causes damage to the autonomic nervous system, which controls the following functions:  Heartbeat.  Body temperature.  Blood pressure.  Urination.  Digestion.  Sweating.  Sexual function.  Focal neuropathy--Focal neuropathy can be painful and unpredictable and occurs most often in older adults with diabetes mellitus. It involves a specific nerve or one area and often comes on suddenly. It usually does not cause long-term problems.  Radiculoplexus neuropathy-- Sometimes called lumbosacral radiculoplexus neuropathy, radiculoplexus neuropathy affects the nerves of the thighs, hips, buttocks, or legs. It is more common in people with type 2 diabetes mellitus and in older men. It is characterized by debilitating pain, weakness, and atrophy, usually in  the thigh muscles. CAUSES  The cause of peripheral, autonomic, and focal neuropathies is diabetes mellitus that is uncontrolled and high glucose levels. The cause of radiculoplexus neuropathy is unknown. However, it is thought to be caused by inflammation related to uncontrolled glucose levels. SIGNS AND SYMPTOMS  Peripheral Neuropathy Peripheral neuropathy develops slowly over time. When the nerves of the feet and legs no longer work there may be:   Burning, stabbing, or aching pain in the legs or feet.  Inability to feel pressure or pain in your feet. This can lead to:  Thick calluses over pressure areas.  Pressure sores.  Ulcers.  Foot deformities.  Reduced ability to feel temperature changes.  Muscle weakness. Autonomic Neuropathy The symptoms of autonomic neuropathy vary depending on which nerves are affected. Symptoms may include:  Problems with digestion, such as:  Feeling sick to your stomach (nausea).  Vomiting.  Bloating.  Constipation.  Diarrhea.  Abdominal pain.  Difficulty with urination. This occurs if you lose your ability to sense when your bladder is full. Problems include:  Urine leakage (incontinence).  Inability to empty your bladder completely (retention).  Rapid or irregular heartbeat (palpitations).  Blood pressure drops when you stand up (orthostatic hypotension). When you stand up you may feel:  Dizzy.  Weak.  Faint.  In men, inability to attain and maintain an erection.  In women, vaginal dryness and problems with decreased sexual desire and arousal.  Problems with body temperature regulation.  Increased or decreased sweating. Focal Neuropathy  Abnormal eye movements or abnormal alignment of both eyes.  Weakness in the wrist.  Foot drop. This results in an inability to lift the foot properly and abnormal walking or foot movement.  Paralysis on one side of your face (Bell palsy).  Chest or abdominal pain. Radiculoplexus  Neuropathy  Sudden, severe pain in your hip, thigh, or buttocks.  Weakness and wasting of thigh muscles.  Difficulty rising from a seated position.  Abdominal swelling.  Unexplained weight loss (usually more than 10 lb [4.5 kg]). DIAGNOSIS  Peripheral Neuropathy Your senses may be tested. Sensory function testing can be done with:  A light touch using a monofilament.  A vibration with tuning fork.  A sharp sensation with a pin prick. Other tests that can help diagnose neuropathy are:  Nerve conduction velocity. This test checks the transmission of an electrical current through a nerve.  Electromyography. This shows how muscles respond to electrical signals transmitted by nearby nerves.  Quantitative sensory testing. This is used to assess how your nerves respond to vibrations and changes in temperature. Autonomic Neuropathy Diagnosis is often based on reported symptoms. Tell your health care provider if you experience:   Dizziness.   Constipation.   Diarrhea.   Inappropriate urination or inability to urinate.   Inability to get or maintain an erection.  Tests that may be done include:   Electrocardiography or Holter monitor. These are tests that can help show problems with the heart rate or heart rhythm.   An X-ray exam may be done. Focal Neuropathy Diagnosis is made based on your symptoms and what your health care provider finds during your exam. Other tests may be done. They may include:  Nerve conduction velocities. This checks the transmission of electrical current through a nerve.  Electromyography. This shows how muscles respond to electrical signals transmitted by nearby nerves.  Quantitative sensory testing. This test is used to assess how your nerves respond to vibration and changes in temperature. Radiculoplexus Neuropathy  Often the first thing is to eliminate any other issue or problems that might be the cause, as there is no stick test for  diagnosis.  X-ray exam of your spine and lumbar region.  Spinal tap to rule out cancer.  MRI to rule out other lesions. TREATMENT  Once nerve damage occurs, it cannot be reversed. The goal of treatment is to keep the disease or nerve damage from getting worse and affecting more nerve fibers. Controlling your blood glucose level is the key. Most people with radiculoplexus neuropathy see at least a partial improvement over time. You will need to keep your blood glucose and HbA1c levels in the target range determined by your health care provider. Things that help control blood glucose levels include:   Blood glucose monitoring.   Meal planning.   Physical activity.   Diabetes medicine.  Over time, maintaining lower blood glucose levels helps lessen symptoms. Sometimes, prescription pain medicine is needed. HOME CARE INSTRUCTIONS:  Do not smoke.  Keep your blood glucose level in the range that you and your health care provider have determined acceptable for you.  Keep your blood pressure level in the range that you and your health care provider have determined acceptable for you.  Eat a well-balanced diet.  Be active every day.  Check your feet every day. SEEK MEDICAL CARE IF:   You have burning, stabbing, or aching pain in the legs or feet.  You are unable to feel pressure or pain in your feet.  You develop problems with digestion such as:  Nausea.  Vomiting.  Bloating.  Constipation.  Diarrhea.  Abdominal pain.  You have difficulty with urination, such as:  Incontinence.  Retention.  You have palpitations.  You develop orthostatic hypotension. When you stand up you may feel:  Dizzy.  Weak.  Faint.  You cannot attain and maintain an erection (in men).  You have vaginal dryness and problems with decreased sexual desire and arousal (in women).  You have severe pain in your thighs, legs, or buttocks.  You have unexplained weight loss. Document  Released: 03/18/2001 Document Revised: 10/28/2012 Document Reviewed: 06/18/2012 [facility] Patient Information 2015 [place], [place]. This information is not intended to replace advice given to you by your health care provider. Make sure you discuss any questions you have with your health care provider.  Diabetes is a very complicated disease...lets simplify it.  An easy way to look at it to understand the complications is if you think of the extra sugar floating in your blood stream as glass shards floating through your blood stream.    Diabetes affects your small vessels first: 1) The glass shards (sugar) scraps down the tiny blood vessels in your eyes and lead to diabetic retinopathy, the leading cause of blindness in the [place]. Diabetes is the leading cause of newly diagnosed adult (49 to 56 years of age) blindness in the [place].  2) The glass shards scratches down the tiny vessels of your legs leading to nerve damage called neuropathy and can lead to amputations of your feet. More than 60% of all non-traumatic amputations of lower limbs occur in people with diabetes.  3) Over time the small vessels in your brain are shredded and closed off, individually this does not cause any problems but over a long period of time many of the small vessels being blocked can lead to Vascular Dementia.   4) Your kidney's are a filter system and have a "net" that keeps certain things in the body and lets bad things out. Sugar shreds this net and leads to kidney damage and eventually failure. Decreasing the sugar that is destroying the net and certain blood pressure medications can help stop or decrease progression of kidney disease. Diabetes was the primary cause of kidney failure in 44 percent of all new cases in 2011.  5) Diabetes also destroys the small vessels in your penis that lead to erectile dysfunction. Eventually the vessels are so damaged that you may not be responsive to cialis or viagra.    Diabetes and your large vessels: Your larger vessels consist of your coronary arteries in your heart and the carotid vessels to your brain. Diabetes or even increased sugars put you at 300% increased risk of heart attack and stroke and this is why.. The sugar scrapes down your large blood vessels and your body sees this as an internal injury and tries to repair itself. Just like you get a scab on your skin, your platelets will stick to the blood vessel wall trying to heal it. This is why we have diabetics on low dose aspirin daily, this prevents the platelets from sticking and can prevent plaque formation. In addition, your body takes cholesterol and tries to shove it into the open wound. This is why we want your LDL, or bad cholesterol, below 70.   The combination of platelets and cholesterol over 5-10 years forms plaque that can break off and cause a heart attack or stroke.   PLEASE REMEMBER:  Diabetes is preventable! Up to 85 percent of complications and morbidities among individuals with type 2 diabetes can  be prevented, delayed, or effectively treated and minimized with regular visits to a health professional, appropriate monitoring and medication, and a healthy diet and lifestyle.  Recommendations For Diabetic/Prediabetic Patients:   -  Take medications as prescribed  -  Recommend Dr Francis Dowse Fuhrman's book "The End of Diabetes "  And "The End of Dieting"- Can get at  www.Amazon.com and encourage also get the Audio CD book  - AVOID Animal products, ie. Meat - red/white, Poultry and Dairy/especially cheese - Exercise at least 5 times a week for 30 minutes or preferably daily.  - No Smoking - Drink less than 2 drinks a day.  - Monitor your feet for sores - Have yearly Eye Exams - Recommend annual Flu vaccine  - Recommend Pneumovax and Prevnar vaccines - Shingles Vaccine (Zostavax) if over 74 y.o.  Goals:   - BMI less than 24 - Fasting sugar less than 130 or less than 150 if tapering  medicines to lose weight  - Systolic BP less than 130  - Diastolic BP less than 80 - Bad LDL Cholesterol less than 70 - Triglycerides less than 150

## 2014-08-03 LAB — HEPATIC FUNCTION PANEL
ALBUMIN: 4.1 g/dL (ref 3.5–5.2)
ALT: 41 U/L (ref 0–53)
AST: 29 U/L (ref 0–37)
Alkaline Phosphatase: 63 U/L (ref 39–117)
BILIRUBIN INDIRECT: 0.5 mg/dL (ref 0.2–1.2)
Bilirubin, Direct: 0.1 mg/dL (ref 0.0–0.3)
Total Bilirubin: 0.6 mg/dL (ref 0.2–1.2)
Total Protein: 7.4 g/dL (ref 6.0–8.3)

## 2014-08-03 LAB — CBC WITH DIFFERENTIAL/PLATELET
BASOS ABS: 0 10*3/uL (ref 0.0–0.1)
BASOS PCT: 0 % (ref 0–1)
Eosinophils Absolute: 0.1 10*3/uL (ref 0.0–0.7)
Eosinophils Relative: 1 % (ref 0–5)
HEMATOCRIT: 47.5 % (ref 39.0–52.0)
HEMOGLOBIN: 16.5 g/dL (ref 13.0–17.0)
LYMPHS ABS: 3.8 10*3/uL (ref 0.7–4.0)
Lymphocytes Relative: 32 % (ref 12–46)
MCH: 32.2 pg (ref 26.0–34.0)
MCHC: 34.7 g/dL (ref 30.0–36.0)
MCV: 92.6 fL (ref 78.0–100.0)
MONOS PCT: 10 % (ref 3–12)
MPV: 11.5 fL (ref 8.6–12.4)
Monocytes Absolute: 1.2 10*3/uL — ABNORMAL HIGH (ref 0.1–1.0)
Neutro Abs: 6.8 10*3/uL (ref 1.7–7.7)
Neutrophils Relative %: 57 % (ref 43–77)
Platelets: 228 10*3/uL (ref 150–400)
RBC: 5.13 MIL/uL (ref 4.22–5.81)
RDW: 13.9 % (ref 11.5–15.5)
WBC: 12 10*3/uL — ABNORMAL HIGH (ref 4.0–10.5)

## 2014-08-03 LAB — HEMOGLOBIN A1C
Hgb A1c MFr Bld: 7.2 % — ABNORMAL HIGH (ref <5.7)
MEAN PLASMA GLUCOSE: 160 mg/dL — AB (ref <117)

## 2014-08-03 LAB — BASIC METABOLIC PANEL WITH GFR
BUN: 20 mg/dL (ref 6–23)
CHLORIDE: 100 meq/L (ref 96–112)
CO2: 22 mEq/L (ref 19–32)
Calcium: 9.6 mg/dL (ref 8.4–10.5)
Creat: 0.92 mg/dL (ref 0.50–1.35)
GFR, Est African American: >89 mL/min
GFR, Est Non African American: >89 mL/min
Glucose, Bld: 125 mg/dL — ABNORMAL HIGH (ref 70–99)
Potassium: 4.1 mEq/L (ref 3.5–5.3)
SODIUM: 137 meq/L (ref 135–145)

## 2014-08-03 LAB — VITAMIN D 25 HYDROXY (VIT D DEFICIENCY, FRACTURES): Vit D, 25-Hydroxy: 63 ng/mL (ref 30–100)

## 2014-08-03 LAB — LIPID PANEL
Cholesterol: 213 mg/dL — ABNORMAL HIGH (ref 0–200)
HDL: 24 mg/dL — ABNORMAL LOW (ref >=40)
Total CHOL/HDL Ratio: 8.9 Ratio
Triglycerides: 571 mg/dL — ABNORMAL HIGH (ref <150)

## 2014-08-03 LAB — TSH: TSH: 2.112 u[IU]/mL (ref 0.350–4.500)

## 2014-08-03 LAB — MAGNESIUM: Magnesium: 2 mg/dL (ref 1.5–2.5)

## 2014-08-03 LAB — INSULIN, FASTING: Insulin fasting, serum: 60.6 u[IU]/mL — ABNORMAL HIGH (ref 2.0–19.6)

## 2014-08-22 ENCOUNTER — Other Ambulatory Visit: Payer: Self-pay | Admitting: Internal Medicine

## 2014-08-23 ENCOUNTER — Telehealth: Payer: Self-pay | Admitting: *Deleted

## 2014-08-23 NOTE — Telephone Encounter (Signed)
Faxed [facility] at [phone] to verify RX for Vitamin D 50,000 units 1 daily is the correct dose due to malabsorption, per Dr Oneta Rack.

## 2014-09-01 ENCOUNTER — Telehealth: Payer: Self-pay | Admitting: *Deleted

## 2014-09-01 NOTE — Telephone Encounter (Signed)
Patient has been denied by his insurance for Vitamin D 50,000 units 1 a day, due to the fact it exceeds the quantity limit.  Per Dr Oneta Rack,  the patient can get the 50,000 units #100 caps for $30 by ordering them from the website [email or web address]  Patient aware and will try to order from there.

## 2014-09-06 ENCOUNTER — Encounter: Payer: Self-pay | Admitting: Internal Medicine

## 2014-10-26 ENCOUNTER — Other Ambulatory Visit: Payer: Self-pay | Admitting: Internal Medicine

## 2014-10-26 ENCOUNTER — Other Ambulatory Visit: Payer: Self-pay | Admitting: Physician Assistant

## 2014-11-08 ENCOUNTER — Encounter: Payer: Self-pay | Admitting: Internal Medicine

## 2014-11-08 ENCOUNTER — Ambulatory Visit (INDEPENDENT_AMBULATORY_CARE_PROVIDER_SITE_OTHER): Payer: 59 | Admitting: Internal Medicine

## 2014-11-08 VITALS — BP 146/92 | HR 80 | Temp 97.5°F | Resp 16 | Ht 74.75 in | Wt 287.5 lb

## 2014-11-08 DIAGNOSIS — Z79899 Other long term (current) drug therapy: Secondary | ICD-10-CM

## 2014-11-08 DIAGNOSIS — N182 Chronic kidney disease, stage 2 (mild): Secondary | ICD-10-CM

## 2014-11-08 DIAGNOSIS — E1122 Type 2 diabetes mellitus with diabetic chronic kidney disease: Secondary | ICD-10-CM

## 2014-11-08 DIAGNOSIS — Z1212 Encounter for screening for malignant neoplasm of rectum: Secondary | ICD-10-CM

## 2014-11-08 DIAGNOSIS — E1149 Type 2 diabetes mellitus with other diabetic neurological complication: Secondary | ICD-10-CM

## 2014-11-08 DIAGNOSIS — E785 Hyperlipidemia, unspecified: Secondary | ICD-10-CM | POA: Diagnosis not present

## 2014-11-08 DIAGNOSIS — Z6836 Body mass index (BMI) 36.0-36.9, adult: Secondary | ICD-10-CM

## 2014-11-08 DIAGNOSIS — E291 Testicular hypofunction: Secondary | ICD-10-CM | POA: Diagnosis not present

## 2014-11-08 DIAGNOSIS — Z111 Encounter for screening for respiratory tuberculosis: Secondary | ICD-10-CM | POA: Diagnosis not present

## 2014-11-08 DIAGNOSIS — R6889 Other general symptoms and signs: Secondary | ICD-10-CM | POA: Diagnosis not present

## 2014-11-08 DIAGNOSIS — R5383 Other fatigue: Secondary | ICD-10-CM | POA: Diagnosis not present

## 2014-11-08 DIAGNOSIS — E349 Endocrine disorder, unspecified: Secondary | ICD-10-CM

## 2014-11-08 DIAGNOSIS — E1140 Type 2 diabetes mellitus with diabetic neuropathy, unspecified: Secondary | ICD-10-CM

## 2014-11-08 DIAGNOSIS — Z0001 Encounter for general adult medical examination with abnormal findings: Secondary | ICD-10-CM | POA: Diagnosis not present

## 2014-11-08 DIAGNOSIS — Z125 Encounter for screening for malignant neoplasm of prostate: Secondary | ICD-10-CM

## 2014-11-08 DIAGNOSIS — I10 Essential (primary) hypertension: Secondary | ICD-10-CM

## 2014-11-08 DIAGNOSIS — E559 Vitamin D deficiency, unspecified: Secondary | ICD-10-CM

## 2014-11-08 MED ORDER — GABAPENTIN 300 MG PO CAPS: ORAL_CAPSULE | ORAL | Status: DC

## 2014-11-08 NOTE — Patient Instructions (Signed)
Recommend Adult Low Dose Aspirin or   coated  Aspirin 81 mg daily   To reduce risk of Colon Cancer 20 %,   Skin Cancer 26 % ,   Melanoma 46%   and   Pancreatic cancer 60%   ++++++++++++++++++++++++++++++++++++++++++++++++++++++  Vitamin D goal   is between 70-100.   Please make sure that you are taking your Vitamin D as directed.   It is very important as a natural anti-inflammatory   helping hair, skin, and nails, as well as reducing stroke and heart attack risk.   It helps your bones and helps with mood.  It also decreases numerous cancer risks so please take it as directed.   Low Vit D is associated with a 200-300% higher risk for CANCER   and 200-300% higher risk for HEART   ATTACK  &  STROKE.   ......................................  It is also associated with higher death rate at younger ages,   autoimmune diseases like Rheumatoid arthritis, Lupus, Multiple Sclerosis.     Also many other serious conditions, like depression, Alzheimer's  Dementia, infertility, muscle aches, fatigue, fibromyalgia - just to name a few.  ++++++++++++++++++++++++++++++++++++++++++++++++  Recommend the book "The END of DIETING" by Dr Joel Fuhrman   & the book "The END of DIABETES " by Dr Joel Fuhrman  At [employer] - get book & Audio CD's     Being diabetic has a  300% increased risk for heart attack, stroke, cancer, and alzheimer- type vascular dementia. It is very important that you work harder with diet by avoiding all foods that are white. Avoid white rice (brown & wild rice is OK), white potatoes (sweetpotatoes in moderation is OK), White bread or wheat bread or anything made out of white flour like bagels, donuts, rolls, buns, biscuits, cakes, pastries, cookies, pizza crust, and pasta (made from white flour & egg whites) - vegetarian pasta or spinach or wheat pasta is OK. Multigrain breads like Arnold's or [employer], or multigrain sandwich thins or flatbreads.  Diet,  exercise and weight loss can reverse and cure diabetes in the early stages.  Diet, exercise and weight loss is very important in the control and prevention of complications of diabetes which affects every system in your body, ie. Brain - dementia/stroke, eyes - glaucoma/blindness, heart - heart attack/heart failure, kidneys - dialysis, stomach - gastric paralysis, intestines - malabsorption, nerves - severe painful neuritis, circulation - gangrene & loss of a leg(s), and finally cancer and Alzheimers.    I recommend avoid fried & greasy foods,  sweets/candy, white rice (brown or wild rice or Quinoa is OK), white potatoes (sweet potatoes are OK) - anything made from white flour - bagels, doughnuts, rolls, buns, biscuits,white and wheat breads, pizza crust and traditional pasta made of white flour & egg white(vegetarian pasta or spinach or wheat pasta is OK).  Multi-grain bread is OK - like multi-grain flat bread or sandwich thins. Avoid alcohol in excess. Exercise is also important.    Eat all the vegetables you want - avoid meat, especially red meat and dairy - especially cheese.  Cheese is the most concentrated form of trans-fats which is the worst thing to clog up our arteries. Veggie cheese is OK which can be found in the fresh produce section at [employer] or [employer] or [employer]  ++++++++++++++++++++++++++++++++++++++++++++++++++ DASH Eating Plan  DASH stands for "Dietary Approaches to Stop Hypertension."   The DASH eating plan is a healthy eating plan that has been shown to reduce high   blood pressure (hypertension). Additional health benefits may include reducing the risk of type 2 diabetes mellitus, heart disease, and stroke. The DASH eating plan may also help with weight loss.  WHAT DO I NEED TO KNOW ABOUT THE DASH EATING PLAN? For the DASH eating plan, you will follow these general guidelines:  Choose foods with a percent daily value for sodium of less than 5% (as listed on the food  label).  Use salt-free seasonings or herbs instead of table salt or sea salt.  Check with your health care provider or pharmacist before using salt substitutes.  Eat lower-sodium products, often labeled as "lower sodium" or "no salt added."  Eat fresh foods.  Eat more vegetables, fruits, and low-fat dairy products.    Choose whole grains. Look for the word "whole" as the first word in the ingredient list.  Choose fish   Limit sweets, desserts, sugars, and sugary drinks.  Choose heart-healthy fats.  Eat veggie cheese   Eat more home-cooked food and less restaurant, buffet, and fast food.  Limit fried foods.  Cook foods using methods other than frying.  Limit canned vegetables. If you do use them, rinse them well to decrease the sodium.  When eating at a restaurant, ask that your food be prepared with less salt, or no salt if possible.                      WHAT FOODS CAN I EAT?  Seek help from a dietitian for individual calorie needs. Grains Whole grain or whole wheat bread. Brown rice. Whole grain or whole wheat pasta. Quinoa, bulgur, and whole grain cereals. Low-sodium cereals. Corn or whole wheat flour tortillas. Whole grain cornbread. Whole grain crackers. Low-sodium crackers.  Vegetables Fresh or frozen vegetables (raw, steamed, roasted, or grilled). Low-sodium or reduced-sodium tomato and vegetable juices. Low-sodium or reduced-sodium tomato sauce and paste. Low-sodium or reduced-sodium canned vegetables.   Fruits All fresh, canned (in natural juice), or frozen fruits.  Meat and Other Protein Products  All fish and seafood.  Dried beans, peas, or lentils. Unsalted nuts and seeds. Unsalted canned beans. Dairy Low-fat dairy products, such as skim or 1% milk, 2% or reduced-fat cheeses, low-fat ricotta or cottage cheese, or plain low-fat yogurt. Low-sodium or reduced-sodium cheeses.  Fats and Oils Tub margarines without trans fats. Light or reduced-fat mayonnaise  and salad dressings (reduced sodium). Avocado. Safflower, olive, or canola oils. Natural peanut or almond butter.  Other Unsalted popcorn and pretzels. The items listed above may not be a complete list of recommended foods or beverages. Contact your dietitian for more options.  +++++++++++++++++++++++++++++++++++++++++++  WHAT FOODS ARE NOT RECOMMENDED?  Grains/ White flour or wheat flour  White bread. White pasta. White rice. Refined cornbread. Bagels and croissants. Crackers that contain trans fat.  Vegetables  Creamed or fried vegetables. Vegetables in a . Regular canned vegetables. Regular canned tomato sauce and paste. Regular tomato and vegetable juices.  Fruits Dried fruits. Canned fruit in light or heavy syrup. Fruit juice.  Meat and Other Protein Products Meat in general. Fatty cuts of meat. Ribs, chicken wings, bacon, sausage, bologna, salami, chitterlings, fatback, hot dogs, bratwurst, and packaged luncheon meats. Salted nuts and seeds. Canned beans with salt.  Dairy Whole or 2% milk, cream, half-and-half, and cream cheese. Whole-fat or sweetened yogurt. Full-fat cheeses or blue cheese. Nondairy creamers and whipped toppings. Processed cheese, cheese spreads, or cheese curds.  Condiments Onion and garlic salt, seasoned salt, table salt, and sea   salt. Canned and packaged gravies. Worcestershire sauce. Tartar sauce. Barbecue sauce. Teriyaki sauce. Soy sauce, including reduced sodium. Steak sauce. Fish sauce. Oyster sauce. Cocktail sauce. Horseradish. Ketchup and mustard. Meat flavorings and tenderizers. Bouillon cubes. Hot sauce. Tabasco sauce. Marinades. Taco seasonings. Relishes.  Fats and Oils Butter, stick margarine, lard, shortening, ghee, and bacon fat. Coconut, palm kernel, or palm oils. Regular salad dressings.  Pickles and olives. Salted popcorn and pretzels. The items listed above may not be a complete list of foods and beverages to avoid.   Preventive Care for  Adults  A healthy lifestyle and preventive care can promote health and wellness. Preventive health guidelines for men include the following key practices:  A routine yearly physical is a good way to check with your health care provider about your health and preventative screening. It is a chance to share any concerns and updates on your health and to receive a thorough exam.  Visit your dentist for a routine exam and preventative care every 6 months. Brush your teeth twice a day and floss once a day. Good oral hygiene prevents tooth decay and gum disease.  The frequency of eye exams is based on your age, health, family medical history, use of contact lenses, and other factors. Follow your health care provider's recommendations for frequency of eye exams.  Eat a healthy diet. Foods such as vegetables, fruits, whole grains, low-fat dairy products, and lean protein foods contain the nutrients you need without too many calories. Decrease your intake of foods high in solid fats, added sugars, and salt. Eat the right amount of calories for you.Get information about a proper diet from your health care provider, if necessary.  Regular physical exercise is one of the most important things you can do for your health. Most adults should get at least 150 minutes of moderate-intensity exercise (any activity that increases your heart rate and causes you to sweat) each week. In addition, most adults need muscle-strengthening exercises on 2 or more days a week.  Maintain a healthy weight. The body mass index (BMI) is a screening tool to identify possible weight problems. It provides an estimate of body fat based on height and weight. Your health care provider can find your BMI and can help you achieve or maintain a healthy weight.For adults 20 years and older:  A BMI below 18.5 is considered underweight.  A BMI of 18.5 to 24.9 is normal.  A BMI of 25 to 29.9 is considered overweight.  A BMI of 30 and above  is considered obese.  Maintain normal blood lipids and cholesterol levels by exercising and minimizing your intake of saturated fat. Eat a balanced diet with plenty of fruit and vegetables. Blood tests for lipids and cholesterol should begin at age 20 and be repeated every 5 years. If your lipid or cholesterol levels are high, you are over 50, or you are at high risk for heart disease, you may need your cholesterol levels checked more frequently.Ongoing high lipid and cholesterol levels should be treated with medicines if diet and exercise are not working.  If you smoke, find out from your health care provider how to quit. If you do not use tobacco, do not start.  Lung cancer screening is recommended for adults aged 55-80 years who are at high risk for developing lung cancer because of a history of smoking. A yearly low-dose CT scan of the lungs is recommended for people who have at least a 30-pack-year history of smoking and are a   current smoker or have quit within the past 15 years. A pack year of smoking is smoking an average of 1 pack of cigarettes a day for 1 year (for example: 1 pack a day for 30 years or 2 packs a day for 15 years). Yearly screening should continue until the smoker has stopped smoking for at least 15 years. Yearly screening should be stopped for people who develop a health problem that would prevent them from having lung cancer treatment.  If you choose to drink alcohol, do not have more than 2 drinks per day. One drink is considered to be 12 ounces (355 mL) of beer, 5 ounces (148 mL) of wine, or 1.5 ounces (44 mL) of liquor.  Avoid use of street drugs. Do not share needles with anyone. Ask for help if you need support or instructions about stopping the use of drugs.  High blood pressure causes heart disease and increases the risk of stroke. Your blood pressure should be checked at least every 1-2 years. Ongoing high blood pressure should be treated with medicines, if weight loss  and exercise are not effective.  If you are 45-79 years old, ask your health care provider if you should take aspirin to prevent heart disease.  Diabetes screening involves taking a blood sample to check your fasting blood sugar level. This should be done once every 3 years, after age 45, if you are within normal weight and without risk factors for diabetes. Testing should be considered at a younger age or be carried out more frequently if you are overweight and have at least 1 risk factor for diabetes.  Colorectal cancer can be detected and often prevented. Most routine colorectal cancer screening begins at the age of 50 and continues through age 75. However, your health care provider may recommend screening at an earlier age if you have risk factors for colon cancer. On a yearly basis, your health care provider may provide home test kits to check for hidden blood in the stool. Use of a small camera at the end of a tube to directly examine the colon (sigmoidoscopy or colonoscopy) can detect the earliest forms of colorectal cancer. Talk to your health care provider about this at age 50, when routine screening begins. Direct exam of the colon should be repeated every 5-10 years through age 75, unless early forms of precancerous polyps or small growths are found.   Talk with your health care provider about prostate cancer screening.  Testicular cancer screening isrecommended for adult males. Screening includes self-exam, a health care provider exam, and other screening tests. Consult with your health care provider about any symptoms you have or any concerns you have about testicular cancer.  Use sunscreen. Apply sunscreen liberally and repeatedly throughout the day. You should seek shade when your shadow is shorter than you. Protect yourself by wearing long sleeves, pants, a wide-brimmed hat, and sunglasses year round, whenever you are outdoors.  Once a month, do a whole-body skin exam, using a mirror to  look at the skin on your back. Tell your health care provider about new moles, moles that have irregular borders, moles that are larger than a pencil eraser, or moles that have changed in shape or color.  Stay current with required vaccines (immunizations).  Influenza vaccine. All adults should be immunized every year.  Tetanus, diphtheria, and acellular pertussis (Td, Tdap) vaccine. An adult who has not previously received Tdap or who does not know his vaccine status should receive 1 dose of   Tdap. This initial dose should be followed by tetanus and diphtheria toxoids (Td) booster doses every 10 years. Adults with an unknown or incomplete history of completing a 3-dose immunization series with Td-containing vaccines should begin or complete a primary immunization series including a Tdap dose. Adults should receive a Td booster every 10 years.  Varicella vaccine. An adult without evidence of immunity to varicella should receive 2 doses or a second dose if he has previously received 1 dose.  Human papillomavirus (HPV) vaccine. Males aged 13-21 years who have not received the vaccine previously should receive the 3-dose series. Males aged 22-26 years may be immunized. Immunization is recommended through the age of 26 years for any male who has sex with males and did not get any or all doses earlier. Immunization is recommended for any person with an immunocompromised condition through the age of 26 years if he did not get any or all doses earlier. During the 3-dose series, the second dose should be obtained 4-8 weeks after the first dose. The third dose should be obtained 24 weeks after the first dose and 16 weeks after the second dose.  Zoster vaccine. One dose is recommended for adults aged 60 years or older unless certain conditions are present.    PREVNAR  - Pneumococcal 13-valent conjugate (PCV13) vaccine. When indicated, a person who is uncertain of his immunization history and has no record of  immunization should receive the PCV13 vaccine. An adult aged 19 years or older who has certain medical conditions and has not been previously immunized should receive 1 dose of PCV13 vaccine. This PCV13 should be followed with a dose of pneumococcal polysaccharide (PPSV23) vaccine. The PPSV23 vaccine dose should be obtained at least 8 weeks after the dose of PCV13 vaccine. An adult aged 19 years or older who has certain medical conditions and previously received 1 or more doses of PPSV23 vaccine should receive 1 dose of PCV13. The PCV13 vaccine dose should be obtained 1 or more years after the last PPSV23 vaccine dose.    PNEUMOVAX - Pneumococcal polysaccharide (PPSV23) vaccine. When PCV13 is also indicated, PCV13 should be obtained first. All adults aged 65 years and older should be immunized. An adult younger than age 65 years who has certain medical conditions should be immunized. Any person who resides in a nursing home or long-term care facility should be immunized. An adult smoker should be immunized. People with an immunocompromised condition and certain other conditions should receive both PCV13 and PPSV23 vaccines. People with human immunodeficiency virus (HIV) infection should be immunized as soon as possible after diagnosis. Immunization during chemotherapy or radiation therapy should be avoided. Routine use of PPSV23 vaccine is not recommended for American Indians, [place] Natives, or people younger than 65 years unless there are medical conditions that require PPSV23 vaccine. When indicated, people who have unknown immunization and have no record of immunization should receive PPSV23 vaccine. One-time revaccination 5 years after the first dose of PPSV23 is recommended for people aged 19-64 years who have chronic kidney failure, nephrotic syndrome, asplenia, or immunocompromised conditions. People who received 1-2 doses of PPSV23 before age 65 years should receive another dose of PPSV23 vaccine at age  65 years or later if at least 5 years have passed since the previous dose. Doses of PPSV23 are not needed for people immunized with PPSV23 at or after age 65 years.    Hepatitis A vaccine. Adults who wish to be protected from this disease, have certain high-risk conditions,   work with hepatitis A-infected animals, work in hepatitis A research labs, or travel to or work in countries with a high rate of hepatitis A should be immunized. Adults who were previously unvaccinated and who anticipate close contact with an international adoptee during the first 60 days after arrival in the [place] from a country with a high rate of hepatitis A should be immunized.    Hepatitis B vaccine. Adults should be immunized if they wish to be protected from this disease, have certain high-risk conditions, may be exposed to blood or other infectious body fluids, are household contacts or sex partners of hepatitis B positive people, are clients or workers in certain care facilities, or travel to or work in countries with a high rate of hepatitis B.   Preventive Service / Frequency   Ages 40 to 64  Blood pressure check.  Lipid and cholesterol check  Lung cancer screening. / Every year if you are aged 55-80 years and have a 30-pack-year history of smoking and currently smoke or have quit within the past 15 years. Yearly screening is stopped once you have quit smoking for at least 15 years or develop a health problem that would prevent you from having lung cancer treatment.  Fecal occult blood test (FOBT) of stool. / Every year beginning at age 50 and continuing until age 75. You may not have to do this test if you get a colonoscopy every 10 years.  Flexible sigmoidoscopy** or colonoscopy.** / Every 5 years for a flexible sigmoidoscopy or every 10 years for a colonoscopy beginning at age 50 and continuing until age 75. Screening for abdominal aortic aneurysm (AAA)  by ultrasound is recommended for people who  have history of high blood pressure or who are current or former smokers. 

## 2014-11-08 NOTE — Progress Notes (Signed)
Patient ID: Michael CommonRobert C Arellano, male   DOB: 26-Dec-1960, 54 y.o.   MRN: [id number]   Comprehensive Examination  This very nice 54 y.o. Single WM in a committed relationship x 36 years.  presents for complete physical.  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   HTN predates since 511990. Patient's BP has been controlled at home.Today's BP: (!) 146/92 mmHg. Patient denies any cardiac symptoms as chest pain, palpitations, shortness of breath, dizziness or ankle swelling.   Patient's hyperlipidemia is controlled with diet and medications. Patient denies myalgias or other medication SE's. Last lipids were not at goal with  Cholesterol 213*; HDL 24*; LDL not calc; and elevated Triglycerides 571 on 08/02/2014.   Patient has Morbid Obesity (BMI 36+) and consequent T2_NIDDM  Since 2008 w/ Peripheral sensory neuropathy and CKD - stage 2 which has normalized over the last year. Patient denies reactive hypoglycemic symptoms, visual blurring, diabetic polys or paresthesias. He reports FBG's average about 170 mg%. Last A1c was  7.2% on 08/02/2014.   Finally, patient has history of Vitamin D Deficiency of  24 in 2008 and last vitamin D was  63 on 08/02/2014.      Medication Sig  . Albuterol  HFA  inhaler Inhale 2 puffs into the lungs every 4 (four) hours as needed for wheezing or shortness of breath.  Marland Kitchen. amitriptyline 50 MG tablet TAKE ONE-HALF TO ONE TABLET BY MOUTH IN THE EVENING FOR NERVE PAIN  . citalopram  40 MG tablet TAKE ONE TABLET BY MOUTH ONCE DAILY FOR  MOOD  . cyclobenzaprine  10 MG tablet Take 1 tablet (10 mg total) by mouth 3 (three) times daily as needed for muscle spasms.  . hctz 25 MG tablet TAKE ONE TABLET BY MOUTH ONCE DAILY  . NORCO 5-325  Take 1 tablet by mouth every 4 (four) hours as needed for moderate pain.  . INVOKAMET [id number] MG TABS TAKE ONE TABLET BY MOUTH TWICE DAILY WITH FOOD.  Marland Kitchen. lisinopril  10 MG tablet TAKE ONE  TABLET BY MOUTH ONCE DAILY.  Marland Kitchen. Vitamin D 50,000 UNITS CAPS capsule TAKE ONE CAPSULE BY MOUTH ONCE DAILY (usu 5/wk)    Allergies  Allergen Reactions  . Morphine And Related   . Codeine Camsylate [Codeine] Rash   Past Medical History  Diagnosis Date  . Hypertension   . Hyperlipidemia   . Type II or unspecified type diabetes mellitus without mention of complication, not stated as uncontrolled   . Vitamin D deficiency   . History of kidney stones   . Diabetic neuropathy ([name])   . Other testicular hypofunction   . History of hepatitis C    Health Maintenance  Topic Date Due  . PNEUMOCOCCAL POLYSACCHARIDE VACCINE (1) 11/05/1960  . OPHTHALMOLOGY EXAM  11/05/1968  . COLONOSCOPY  11/05/2008  . INFLUENZA VACCINE  08/22/2014  . FOOT EXAM  09/01/2014  . URINE MICROALBUMIN  09/01/2014  . TETANUS/TDAP  01/22/2015  . HEMOGLOBIN A1C  02/02/2015  . Hepatitis C Screening  Completed  . HIV Screening  Completed   Immunization History  Administered Date(s) Administered  . PPD Test 08/31/2013, 11/08/2014  . Td 01/21/2005   Past Surgical History  Procedure Laterality Date  . Cholecystectomy  1987  . Orif tibia fracture     Family History  Problem Relation Age of Onset  . Hypertension Mother   . Cancer Father     colon  . Alzheimer's disease Father   .  Stroke Father    Social History   Social History  . Marital Status: Married    Spouse Name: N/A  . Number of Children: none  . 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 Smoking status: Current Every Day Smoker -- 0.75 packs/day    Types: Cigarettes  . Smokeless tobacco: Never Used  . Alcohol Use: No  . Drug Use: Yes    Special: Marijuana  . Sexual Activity: Active    ROS Constitutional: Denies fever, chills, weight loss/gain, headaches, insomnia,  night sweats or change in appetite. Does c/o fatigue. Eyes: Denies redness, blurred vision, diplopia, discharge, itchy  or watery eyes.  ENT: Denies discharge, congestion, post nasal drip, epistaxis, sore throat, earache, hearing loss, dental pain, Tinnitus, Vertigo, Sinus pain or snoring.  Cardio: Denies chest pain, palpitations, irregular heartbeat, syncope, dyspnea, diaphoresis, orthopnea, PND, claudication or edema Respiratory: denies cough, dyspnea, DOE, pleurisy, hoarseness, laryngitis or wheezing.  Gastrointestinal: Denies dysphagia, heartburn, reflux, water brash, pain, cramps, nausea, vomiting, bloating, diarrhea, constipation, hematemesis, melena, hematochezia, jaundice or hemorrhoids Genitourinary: Denies dysuria, frequency, urgency, nocturia, hesitancy, discharge, hematuria or flank pain Musculoskeletal: Denies arthralgia, myalgia, stiffness, Jt. Swelling, pain, limp or strain/sprain. Denies Falls. Skin: Denies puritis, rash, hives, warts, acne, eczema or change in skin lesion Neuro: No weakness, tremor, incoordination, spasms, paresthesia or pain Psychiatric: Denies confusion, memory loss or sensory loss. Denies Depression. Endocrine: Denies change in weight, skin, hair change, nocturia, and  diabetic polys, visual blurring or hyper / hypo glycemic episodes. Does c/ painful burning Paresthesias of the feet, esp at nite. Heme/Lymph: No excessive bleeding, bruising or enlarged lymph nodes.  Physical Exam  BP 146/92 mmHg  Pulse 80  Temp(Src) 97.5 F (36.4 C)  Resp 16  Ht 6' 2.75" (1.899 m)  Wt 287 lb 7.5 oz (130.396 kg)  BMI 36.16 kg/m2  General Appearance: Well nourished &  in no apparent distress. Eyes: PERRLA, EOMs, conjunctiva no swelling or erythema, normal fundi and vessels. Sinuses: No frontal/maxillary tenderness ENT/Mouth: EACs patent / TMs  nl. Nares clear without erythema, swelling, mucoid exudates. Oral hygiene is good. No erythema, swelling, or exudate. Tongue normal, non-obstructing. Tonsils not swollen or erythematous. Hearing normal.  Neck: Supple, thyroid normal. No bruits, nodes  or JVD. Respiratory: Respiratory effort normal.  BS equal and clear bilateral without rales, rhonci, wheezing or stridor. Cardio: Heart sounds are normal with regular rate and rhythm and no murmurs, rubs or gallops. Peripheral pulses are normal and equal bilaterally without edema. No aortic or femoral bruits. Chest: symmetric with normal excursions and percussion.  Abdomen: Flat, soft, with bowel sounds. Nontender, no guarding, rebound, hernias, masses, or organomegaly.  Lymphatics: Non tender without lymphadenopathy.  Genitourinary: No hernias.Testes nl. DRE - prostate nl for age - smooth & firm w/o nodules. Musculoskeletal: Full ROM all peripheral extremities, joint stability, 5/5 strength, and normal gait. Skin: Warm and dry without rashes, lesions, cyanosis, clubbing or  ecchymosis.  Neuro: Cranial nerves intact, reflexes equal bilaterally. Normal muscle tone, no cerebellar symptoms. Sensation decreased in a stocking distribution bilaterally by touch & Monofilament testing bilaterally from the distal shins caudad. Marland Kitchen  Pysch: Alert and oriented X 3 with normal affect, insight and judgment appropriate.   Assessment and Plan  1. Encounter for general adult medical examination with abnormal findings  - Microalbumin / creatinine urine ratio - EKG 12-Lead - [place], RETROPERITNL ABD,  LTD - POC Hemoccult Bld/Stl -  Vitamin B12 - PSA - Testosterone - Iron and TIBC - HM DIABETES FOOT EXAM - LOW EXTREMITY NEUR EXAM DOCUM - Urinalysis, Routine w reflex microscopic - CBC with Differential/Platelet - BASIC METABOLIC PANEL WITH GFR - Hepatic function panel - Magnesium - Lipid panel - TSH - Hemoglobin A1c - Insulin, random - Vit D  25 hydroxy   2. Essential hypertension  - EKG 12-Lead - [place], RETROPERITNL ABD,  LTD - TSH  3. Hyperlipidemia  - Lipid panel  4. T2_NIDDM w/ CKD  - Microalbumin / creatinine urine ratio - Hemoglobin A1c - Insulin, random  5. Vitamin D deficiency  - Vit  D  25 hydroxy   6. T2_NIDDM w/Peripheral Neuropathy  - HM DIABETES FOOT EXAM - LOW EXTREMITY NEUR EXAM DOCUM - Hemoglobin A1c - Insulin, random - gabapentin  300 MG capsule; Take 2 to 3 caps at night for Diabetic Neuritis  Disp: 90 cap; Rf: 1  7. Morbid obesity, unspecified obesity type ([name])   8. Testosterone deficiency  - Testosterone  9. Screening for rectal cancer  - POC Hemoccult Bld/Stl   10. Prostate cancer screening  - PSA  11. Screening examination for pulmonary tuberculosis  - PPD  12. BMI 36.16,  adult   13. Other fatigue  - Vitamin B12 - Testosterone - Iron and TIBC  14. Medication management  - Urinalysis, Routine w reflex microscopic  - CBC with Differential/Platelet - BASIC METABOLIC PANEL WITH GFR - Hepatic function panel - Magnesium   Continue prudent diet as discussed, weight control, BP monitoring, regular exercise, and medications as discussed.  Discussed med effects and SE's. Routine screening labs and tests as requested with regular follow-up as recommended.  Over 40 minutes of exam, counseling &  chart review was performed

## 2014-11-09 ENCOUNTER — Other Ambulatory Visit: Payer: Self-pay | Admitting: Internal Medicine

## 2014-11-09 DIAGNOSIS — E781 Pure hyperglyceridemia: Secondary | ICD-10-CM

## 2014-11-09 LAB — IRON AND TIBC
%SAT: 28 % (ref 15–60)
IRON: 85 ug/dL (ref 50–180)
TIBC: 299 ug/dL (ref 250–425)
UIBC: 214 ug/dL (ref 125–400)

## 2014-11-09 LAB — HEPATIC FUNCTION PANEL
ALBUMIN: 4.3 g/dL (ref 3.6–5.1)
ALK PHOS: 67 U/L (ref 40–115)
ALT: 60 U/L — ABNORMAL HIGH (ref 9–46)
AST: 43 U/L — AB (ref 10–35)
BILIRUBIN TOTAL: 0.5 mg/dL (ref 0.2–1.2)
Bilirubin, Direct: 0.1 mg/dL (ref <=0.2)
Indirect Bilirubin: 0.4 mg/dL (ref 0.2–1.2)
TOTAL PROTEIN: 7.1 g/dL (ref 6.1–8.1)

## 2014-11-09 LAB — BASIC METABOLIC PANEL WITH GFR
BUN: 18 mg/dL (ref 7–25)
CO2: 24 mmol/L (ref 20–31)
CREATININE: 0.85 mg/dL (ref 0.70–1.33)
Calcium: 9.2 mg/dL (ref 8.6–10.3)
Chloride: 97 mmol/L — ABNORMAL LOW (ref 98–110)
GFR, Est Non African American: >89 mL/min (ref >=60)
GLUCOSE: 150 mg/dL — AB (ref 65–99)
POTASSIUM: 4.3 mmol/L (ref 3.5–5.3)
Sodium: 134 mmol/L — ABNORMAL LOW (ref 135–146)

## 2014-11-09 LAB — URINALYSIS, ROUTINE W REFLEX MICROSCOPIC
BILIRUBIN URINE: NEGATIVE
HGB URINE DIPSTICK: NEGATIVE
KETONES UR: NEGATIVE
Leukocytes, UA: NEGATIVE
NITRITE: NEGATIVE
PH: 5 (ref 5.0–8.0)
Protein, ur: NEGATIVE
SPECIFIC GRAVITY, URINE: 1.03 (ref 1.001–1.035)

## 2014-11-09 LAB — CBC WITH DIFFERENTIAL/PLATELET
BASOS ABS: 0 10*3/uL (ref 0.0–0.1)
Basophils Relative: 0 % (ref 0–1)
EOS ABS: 0.2 10*3/uL (ref 0.0–0.7)
EOS PCT: 2 % (ref 0–5)
HCT: 49.9 % (ref 39.0–52.0)
Hemoglobin: 16.9 g/dL (ref 13.0–17.0)
LYMPHS ABS: 3.8 10*3/uL (ref 0.7–4.0)
LYMPHS PCT: 43 % (ref 12–46)
MCH: 33.4 pg (ref 26.0–34.0)
MCHC: 33.9 g/dL (ref 30.0–36.0)
MCV: 98.6 fL (ref 78.0–100.0)
MONO ABS: 0.7 10*3/uL (ref 0.1–1.0)
MONOS PCT: 8 % (ref 3–12)
MPV: 11.4 fL (ref 8.6–12.4)
Neutro Abs: 4.2 10*3/uL (ref 1.7–7.7)
Neutrophils Relative %: 47 % (ref 43–77)
PLATELETS: 205 10*3/uL (ref 150–400)
RBC: 5.06 MIL/uL (ref 4.22–5.81)
RDW: 14.3 % (ref 11.5–15.5)
WBC: 8.9 10*3/uL (ref 4.0–10.5)

## 2014-11-09 LAB — VITAMIN B12: Vitamin B-12: 513 pg/mL (ref 211–911)

## 2014-11-09 LAB — HEMOGLOBIN A1C
Hgb A1c MFr Bld: 7.1 % — ABNORMAL HIGH (ref <5.7)
MEAN PLASMA GLUCOSE: 157 mg/dL — AB (ref <117)

## 2014-11-09 LAB — LIPID PANEL
Cholesterol: 206 mg/dL — ABNORMAL HIGH (ref 125–200)
HDL: 22 mg/dL — AB (ref >=40)
TRIGLYCERIDES: 527 mg/dL — AB (ref <150)
Total CHOL/HDL Ratio: 9.4 Ratio — ABNORMAL HIGH (ref <=5.0)

## 2014-11-09 LAB — URINALYSIS, MICROSCOPIC ONLY
Bacteria, UA: NONE SEEN [HPF]
Casts: NONE SEEN [LPF]
Crystals: NONE SEEN [HPF]
RBC / HPF: NONE SEEN RBC/HPF (ref <=2)
Squamous Epithelial / LPF: NONE SEEN [HPF] (ref <=5)
WBC, UA: NONE SEEN WBC/HPF (ref <=5)
Yeast: NONE SEEN [HPF]

## 2014-11-09 LAB — INSULIN, RANDOM: INSULIN: 29.4 u[IU]/mL — AB (ref 2.0–19.6)

## 2014-11-09 LAB — MICROALBUMIN / CREATININE URINE RATIO
CREATININE, URINE: 86 mg/dL (ref 20–370)
MICROALB/CREAT RATIO: 8 ug/mg{creat} (ref <30)
Microalb, Ur: 0.7 mg/dL

## 2014-11-09 LAB — MAGNESIUM: Magnesium: 2.3 mg/dL (ref 1.5–2.5)

## 2014-11-09 LAB — TSH: TSH: 1.809 u[IU]/mL (ref 0.350–4.500)

## 2014-11-09 LAB — VITAMIN D 25 HYDROXY (VIT D DEFICIENCY, FRACTURES): VIT D 25 HYDROXY: 102 ng/mL — AB (ref 30–100)

## 2014-11-09 LAB — PSA: PSA: 1.18 ng/mL (ref <=4.00)

## 2014-11-09 LAB — TESTOSTERONE: Testosterone: 151 ng/dL — ABNORMAL LOW (ref 300–890)

## 2014-11-09 MED ORDER — FENOFIBRATE MICRONIZED 134 MG PO CAPS: ORAL_CAPSULE | ORAL | Status: AC

## 2014-11-10 LAB — TB SKIN TEST
Induration: 0 mm
TB SKIN TEST: NEGATIVE

## 2014-11-28 ENCOUNTER — Other Ambulatory Visit: Payer: Self-pay | Admitting: Physician Assistant

## 2014-12-12 ENCOUNTER — Other Ambulatory Visit: Payer: Self-pay | Admitting: Internal Medicine

## 2014-12-12 ENCOUNTER — Other Ambulatory Visit: Payer: Self-pay | Admitting: Physician Assistant

## 2014-12-13 ENCOUNTER — Other Ambulatory Visit: Payer: Self-pay | Admitting: Internal Medicine

## 2014-12-13 ENCOUNTER — Other Ambulatory Visit: Payer: 59

## 2014-12-13 DIAGNOSIS — R7989 Other specified abnormal findings of blood chemistry: Secondary | ICD-10-CM

## 2014-12-13 DIAGNOSIS — R945 Abnormal results of liver function studies: Secondary | ICD-10-CM

## 2014-12-13 DIAGNOSIS — R748 Abnormal levels of other serum enzymes: Secondary | ICD-10-CM

## 2014-12-13 LAB — HEPATIC FUNCTION PANEL
ALBUMIN: 4 g/dL (ref 3.6–5.1)
ALK PHOS: 62 U/L (ref 40–115)
ALT: 59 U/L — ABNORMAL HIGH (ref 9–46)
AST: 39 U/L — AB (ref 10–35)
Bilirubin, Direct: <0.1 mg/dL (ref <=0.2)
TOTAL PROTEIN: 6.9 g/dL (ref 6.1–8.1)
Total Bilirubin: 0.4 mg/dL (ref 0.2–1.2)

## 2014-12-16 LAB — HEPATITIS C RNA QUANTITATIVE: HCV Quantitative: NOT DETECTED IU/mL (ref <15)

## 2015-01-09 ENCOUNTER — Other Ambulatory Visit: Payer: Self-pay | Admitting: Internal Medicine

## 2015-02-10 ENCOUNTER — Ambulatory Visit: Payer: Self-pay | Admitting: Internal Medicine

## 2015-02-11 ENCOUNTER — Other Ambulatory Visit: Payer: Self-pay | Admitting: Internal Medicine

## 2015-02-16 ENCOUNTER — Ambulatory Visit (INDEPENDENT_AMBULATORY_CARE_PROVIDER_SITE_OTHER): Payer: 59 | Admitting: Physician Assistant

## 2015-02-16 ENCOUNTER — Encounter: Payer: Self-pay | Admitting: Physician Assistant

## 2015-02-16 VITALS — BP 140/78 | HR 86 | Temp 97.2°F | Resp 16 | Ht 74.75 in | Wt 286.0 lb

## 2015-02-16 DIAGNOSIS — Z79899 Other long term (current) drug therapy: Secondary | ICD-10-CM

## 2015-02-16 DIAGNOSIS — E1140 Type 2 diabetes mellitus with diabetic neuropathy, unspecified: Secondary | ICD-10-CM

## 2015-02-16 DIAGNOSIS — E1122 Type 2 diabetes mellitus with diabetic chronic kidney disease: Secondary | ICD-10-CM | POA: Diagnosis not present

## 2015-02-16 DIAGNOSIS — E785 Hyperlipidemia, unspecified: Secondary | ICD-10-CM

## 2015-02-16 DIAGNOSIS — E559 Vitamin D deficiency, unspecified: Secondary | ICD-10-CM

## 2015-02-16 DIAGNOSIS — E349 Endocrine disorder, unspecified: Secondary | ICD-10-CM

## 2015-02-16 DIAGNOSIS — I10 Essential (primary) hypertension: Secondary | ICD-10-CM

## 2015-02-16 DIAGNOSIS — N182 Chronic kidney disease, stage 2 (mild): Secondary | ICD-10-CM

## 2015-02-16 DIAGNOSIS — E1149 Type 2 diabetes mellitus with other diabetic neurological complication: Secondary | ICD-10-CM

## 2015-02-16 LAB — CBC WITH DIFFERENTIAL/PLATELET
BASOS PCT: 1 % (ref 0–1)
Basophils Absolute: 0.1 10*3/uL (ref 0.0–0.1)
Eosinophils Absolute: 0.2 10*3/uL (ref 0.0–0.7)
Eosinophils Relative: 2 % (ref 0–5)
HCT: 50.4 % (ref 39.0–52.0)
HEMOGLOBIN: 17.5 g/dL — AB (ref 13.0–17.0)
Lymphocytes Relative: 37 % (ref 12–46)
Lymphs Abs: 3.4 10*3/uL (ref 0.7–4.0)
MCH: 33.1 pg (ref 26.0–34.0)
MCHC: 34.7 g/dL (ref 30.0–36.0)
MCV: 95.3 fL (ref 78.0–100.0)
MPV: 11.6 fL (ref 8.6–12.4)
Monocytes Absolute: 0.9 10*3/uL (ref 0.1–1.0)
Monocytes Relative: 10 % (ref 3–12)
NEUTROS ABS: 4.6 10*3/uL (ref 1.7–7.7)
NEUTROS PCT: 50 % (ref 43–77)
Platelets: 174 10*3/uL (ref 150–400)
RBC: 5.29 MIL/uL (ref 4.22–5.81)
RDW: 13.7 % (ref 11.5–15.5)
WBC: 9.1 10*3/uL (ref 4.0–10.5)

## 2015-02-16 LAB — HEMOGLOBIN A1C
HEMOGLOBIN A1C: 6.7 % — AB (ref <5.7)
MEAN PLASMA GLUCOSE: 146 mg/dL — AB (ref <117)

## 2015-02-16 LAB — HEPATIC FUNCTION PANEL
ALT: 63 U/L — AB (ref 9–46)
AST: 42 U/L — ABNORMAL HIGH (ref 10–35)
Albumin: 4.3 g/dL (ref 3.6–5.1)
Alkaline Phosphatase: 63 U/L (ref 40–115)
BILIRUBIN DIRECT: 0.1 mg/dL (ref <=0.2)
BILIRUBIN INDIRECT: 0.4 mg/dL (ref 0.2–1.2)
Total Bilirubin: 0.5 mg/dL (ref 0.2–1.2)
Total Protein: 7.2 g/dL (ref 6.1–8.1)

## 2015-02-16 LAB — BASIC METABOLIC PANEL WITH GFR
BUN: 22 mg/dL (ref 7–25)
CALCIUM: 9.9 mg/dL (ref 8.6–10.3)
CHLORIDE: 97 mmol/L — AB (ref 98–110)
CO2: 27 mmol/L (ref 20–31)
Creat: 0.94 mg/dL (ref 0.70–1.33)
GFR, Est African American: >89 mL/min (ref >=60)
Glucose, Bld: 144 mg/dL — ABNORMAL HIGH (ref 65–99)
Potassium: 4.5 mmol/L (ref 3.5–5.3)
SODIUM: 134 mmol/L — AB (ref 135–146)

## 2015-02-16 LAB — LIPID PANEL
CHOL/HDL RATIO: 10 ratio — AB (ref <=5.0)
Cholesterol: 221 mg/dL — ABNORMAL HIGH (ref 125–200)
HDL: 22 mg/dL — ABNORMAL LOW (ref >=40)
Triglycerides: 517 mg/dL — ABNORMAL HIGH (ref <150)

## 2015-02-16 LAB — TSH: TSH: 1.664 u[IU]/mL (ref 0.350–4.500)

## 2015-02-16 LAB — MAGNESIUM: Magnesium: 2.2 mg/dL (ref 1.5–2.5)

## 2015-02-16 MED ORDER — LISINOPRIL 20 MG PO TABS
20.0000 mg | ORAL_TABLET | Freq: Every day | ORAL | Status: DC
Start: 2015-02-16 — End: 2015-08-17

## 2015-02-16 MED ORDER — HYDROCHLOROTHIAZIDE 25 MG PO TABS: 25.0000 mg | ORAL_TABLET | Freq: Every day | ORAL | Status: DC

## 2015-02-16 MED ORDER — CITALOPRAM HYDROBROMIDE 40 MG PO TABS: ORAL_TABLET | ORAL | Status: DC

## 2015-02-16 NOTE — Patient Instructions (Signed)
Diabetes is a very complicated disease...lets simplify it.  An easy way to look at it to understand the complications is if you think of the extra sugar floating in your blood stream as glass shards floating through your blood stream.    Diabetes affects your small vessels first: 1) The glass shards (sugar) scraps down the tiny blood vessels in your eyes and lead to diabetic retinopathy, the leading cause of blindness in the [place]. Diabetes is the leading cause of newly diagnosed adult (20 to 57 years of age) blindness in the [place].  2) The glass shards scratches down the tiny vessels of your legs leading to nerve damage called neuropathy and can lead to amputations of your feet. More than 60% of all non-traumatic amputations of lower limbs occur in people with diabetes.  3) Over time the small vessels in your brain are shredded and closed off, individually this does not cause any problems but over a long period of time many of the small vessels being blocked can lead to Vascular Dementia.   4) Your kidney's are a filter system and have a "net" that keeps certain things in the body and lets bad things out. Sugar shreds this net and leads to kidney damage and eventually failure. Decreasing the sugar that is destroying the net and certain blood pressure medications can help stop or decrease progression of kidney disease. Diabetes was the primary cause of kidney failure in 44 percent of all new cases in 2011.  5) Diabetes also destroys the small vessels in your penis that lead to erectile dysfunction. Eventually the vessels are so damaged that you may not be responsive to cialis or viagra.   Diabetes and your large vessels: Your larger vessels consist of your coronary arteries in your heart and the carotid vessels to your brain. Diabetes or even increased sugars put you at 300% increased risk of heart attack and stroke and this is why.. The sugar scrapes down your large blood vessels and your body  sees this as an internal injury and tries to repair itself. Just like you get a scab on your skin, your platelets will stick to the blood vessel wall trying to heal it. This is why we have diabetics on low dose aspirin daily, this prevents the platelets from sticking and can prevent plaque formation. In addition, your body takes cholesterol and tries to shove it into the open wound. This is why we want your LDL, or bad cholesterol, below 70.   The combination of platelets and cholesterol over 5-10 years forms plaque that can break off and cause a heart attack or stroke.   PLEASE REMEMBER:  Diabetes is preventable! Up to 85 percent of complications and morbidities among individuals with type 2 diabetes can be prevented, delayed, or effectively treated and minimized with regular visits to a health professional, appropriate monitoring and medication, and a healthy diet and lifestyle.     Bad carbs also include fruit juice, alcohol, and sweet tea. These are empty calories that do not signal to your brain that you are full.   Please remember the good carbs are still carbs which convert into sugar. So please measure them out no more than 1/2-1 cup of rice, oatmeal, pasta, and beans  Veggies are however free foods! Pile them on.   Not all fruit is created equal. Please see the list below, the fruit at the bottom is higher in sugars than the fruit at the top. Please avoid all dried fruits.       We want weight loss that will last so you should lose 1-2 pounds a week.  THAT IS IT! Please pick THREE things a month to change. Once it is a habit check off the item. Then pick another three items off the list to become habits.  If you are already doing a habit on the list GREAT!  Cross that item off! o Don't drink your calories. Ie, alcohol, soda, fruit juice, and sweet tea.  o Drink more water. Drink a glass when you feel hungry or before each meal.  o Eat breakfast - Complex carb and protein (likeDannon  light and fit yogurt, oatmeal, fruit, eggs, turkey bacon). o Measure your cereal.  Eat no more than one cup a day. (ie Kashi) o Eat an apple a day. o Add a vegetable a day. o Try a new vegetable a month. o Use Pam! Stop using oil or butter to cook. o Don't finish your plate or use smaller plates. o Share your dessert. o Eat sugar free Jello for dessert or frozen grapes. o Don't eat 2-3 hours before bed. o Switch to whole wheat bread, pasta, and brown rice. o Make healthier choices when you eat out. No fries! o Pick baked chicken, NOT fried. o Don't forget to SLOW DOWN when you eat. It is not going anywhere.  o Take the stairs. o Park far away in the parking lot o Lift soup cans (or weights) for 10 minutes while watching TV. o Walk at work for 10 minutes during break. o Walk outside 1 time a week with your friend, kids, dog, or significant other. o Start a walking group at church. o Walk the mall as much as you can tolerate.  o Keep a food diary. o Weigh yourself daily. o Walk for 15 minutes 3 days per week. o Cook at home more often and eat out less.  If life happens and you go back to old habits, it is okay.  Just start over. You can do it!   If you experience chest pain, get short of breath, or tired during the exercise, please stop immediately and inform your doctor.   

## 2015-02-16 NOTE — Progress Notes (Signed)
Assessment and Plan:  Hypertension: WILL INCREASE LISINOPRIL TO  DAILY, monitor blood pressure at home. Continue DASH diet.  Reminder to go to the ER if any CP, SOB, nausea, dizziness, severe HA, changes vision/speech, left arm numbness and tingling and jaw pain. Cholesterol: Continue diet and exercise. Check cholesterol.  Diabetes with p.neuropathy and CKD-Continue diet and exercise. Check A1C, continue medications.  Vitamin D Def- check level and continue medications.  Obesity with co morbidities- long discussion about weight loss, diet, and exercise Smoking cessation- went 4 1/2 months but has started back.  ED- will try viagra, got married to teresa in Dec  Continue diet and meds as discussed. Further disposition pending results of labs. Discussed med's effects and SE's.   HPI 55 y.o. male  presents for 3 month follow up with hypertension, hyperlipidemia, diabetes and vitamin D. His blood pressure has been controlled at home, today their BP is BP: 140/78 mmHg He does not workout but is active at work. He denies chest pain, shortness of breath, dizziness.  He is not on cholesterol medication and denies myalgias. His cholesterol is at goal. The cholesterol was:  11/08/2014: Cholesterol 206*; HDL 22*; LDL Cholesterol NOT CALC; Triglycerides 527* He has been working on diet and exercise for Diabetes with p. neuropathy and CKD, on gabapentin and elavil for neuropathy which he states helps, he is on invokamet but he is on ACE/ARB, is on bASA, and denies polydipsia, polyuria and visual disturbances. Last A1C  was: 11/08/2014: Hgb A1c MFr Bld 7.1* Patient is on Vitamin D supplement. 11/08/2014: Vit D, 25-Hydroxy 102*  BMI is Body mass index is 35.97 kg/(m^2)., he is working on diet and exercise.  Wt Readings from Last 3 Encounters:  02/16/15 286 lb (129.729 kg)  11/08/14 287 lb 7.5 oz (130.396 kg)  08/02/14 281 lb (127.461 kg)   Current Medications:  Current Outpatient Prescriptions on File  Prior to Visit  Medication Sig Dispense Refill  . albuterol (VENTOLIN HFA) 108 (90 BASE) MCG/ACT inhaler Inhale 2 puffs into the lungs every 4 (four) hours as needed for wheezing or shortness of breath. 1 Inhaler 1  . amitriptyline (ELAVIL) 50 MG tablet TAKE ONE-HALF TO ONE TABLET BY MOUTH IN THE EVENING FOR NERVE PAIN 90 tablet 1  . aspirin 81 MG tablet Take 81 mg by mouth daily.    . citalopram (CELEXA) 40 MG tablet TAKE ONE TABLET BY MOUTH ONCE DAILY FOR MOOD 90 tablet 0  . cyclobenzaprine (FLEXERIL) 10 MG tablet Take 1 tablet (10 mg total) by mouth 3 (three) times daily as needed for muscle spasms. 90 tablet 1  . fenofibrate micronized (LOFIBRA) 134 MG capsule Take 1 capsule daily before breakfast for blood fats 90 capsule 1  . gabapentin (NEURONTIN) 300 MG capsule TAKE TWO TO THREE CAPSULES BY MOUTH AT NIGHT FOR DIABETIC NEURITIS 270 capsule 1  . hydrochlorothiazide (HYDRODIURIL) 25 MG tablet TAKE ONE TABLET BY MOUTH ONCE DAILY 90 tablet 1  . HYDROcodone-acetaminophen (NORCO/VICODIN) 5-325 MG per tablet Take 1 tablet by mouth every 4 (four) hours as needed for moderate pain. 60 tablet 0  . INVOKAMET [id number] MG TABS TAKE ONE TABLET BY MOUTH TWICE DAILY WITH FOOD 180 tablet 1  . lisinopril (PRINIVIL,ZESTRIL) 10 MG tablet TAKE ONE TABLET BY MOUTH ONCE DAILY 90 tablet 1  . OVER THE COUNTER MEDICATION 50,000 Units. Patient takes Vit-D3-50 5 days a week.     No current facility-administered medications on file prior to visit.   Medical History:  Past Medical History  Diagnosis Date  . Hypertension   . Hyperlipidemia   . Type II or unspecified type diabetes mellitus without mention of complication, not stated as uncontrolled   . Vitamin D deficiency   . History of kidney stones   . Diabetic neuropathy ([name])   . Other testicular hypofunction   . History of hepatitis C    Allergies:  Allergies  Allergen Reactions  . Morphine And Related   . Codeine Camsylate [Codeine] Rash    Review  of Systems  Constitutional: Negative for fever and chills.  Respiratory: Negative for cough, hemoptysis, sputum production, shortness of breath and wheezing.   Cardiovascular: Negative for chest pain, palpitations, orthopnea, claudication, leg swelling and PND.  Genitourinary: Negative for frequency.  Neurological: Positive for sensory change. Negative for dizziness, tingling, tremors, speech change, focal weakness, seizures and loss of consciousness.  All other systems reviewed and are negative.  Family history- Review and unchanged Social history- Review and unchanged Physical Exam: BP 140/78 mmHg  Pulse 86  Temp(Src) 97.2 F (36.2 C) (Temporal)  Resp 16  Ht 6' 2.75" (1.899 m)  Wt 286 lb (129.729 kg)  BMI 35.97 kg/m2  SpO2 94% Wt Readings from Last 3 Encounters:  02/16/15 286 lb (129.729 kg)  11/08/14 287 lb 7.5 oz (130.396 kg)  08/02/14 281 lb (127.461 kg)   General Appearance: Well nourished, in no apparent distress. Eyes: PERRLA, EOMs, conjunctiva no swelling or erythema Sinuses: No Frontal/maxillary tenderness ENT/Mouth: Ext aud canals clear, TMs without erythema, bulging. No erythema, swelling, or exudate on post pharynx.  Tonsils not swollen or erythematous. Hearing normal.  Neck: Supple, thyroid normal.  Respiratory: Respiratory effort normal, BS equal bilaterally with wheezing/rhonchi without rales, rhonchi, or stridor.  Cardio: RRR with no MRGs. Decreased bilateral peripheral pulses without edema.  Abdomen: Soft, + BS, obese  Non tender, no guarding, rebound, hernias, masses. Lymphatics: Non tender without lymphadenopathy.  Musculoskeletal: Full ROM, 5/5 strength, normal gait.  Skin: Warm, dry without rashes, lesions, ecchymosis.  Neuro: Cranial nerves intact. No cerebellar symptoms. Sensation decreased bilateral legs to knees. Negative phalens and tinels sign, good bilateral strength.  Psych: Awake and oriented X 3, normal affect, Insight and Judgment appropriate.     Quentin Mulling, PA-C 9:12 AM [facility]

## 2015-02-17 ENCOUNTER — Other Ambulatory Visit: Payer: Self-pay | Admitting: Physician Assistant

## 2015-02-17 LAB — VITAMIN D 25 HYDROXY (VIT D DEFICIENCY, FRACTURES): Vit D, 25-Hydroxy: >150 ng/mL — ABNORMAL HIGH (ref 30–100)

## 2015-02-17 MED ORDER — ATORVASTATIN CALCIUM 40 MG PO TABS: ORAL_TABLET | ORAL | Status: DC

## 2015-03-24 ENCOUNTER — Other Ambulatory Visit: Payer: 59

## 2015-03-24 DIAGNOSIS — E785 Hyperlipidemia, unspecified: Secondary | ICD-10-CM

## 2015-03-24 DIAGNOSIS — Z79899 Other long term (current) drug therapy: Secondary | ICD-10-CM

## 2015-03-24 LAB — LIPID PANEL
CHOLESTEROL: 190 mg/dL (ref 125–200)
HDL: 24 mg/dL — AB (ref >=40)
TRIGLYCERIDES: 565 mg/dL — AB (ref <150)
Total CHOL/HDL Ratio: 7.9 Ratio — ABNORMAL HIGH (ref <=5.0)

## 2015-03-24 LAB — HEPATIC FUNCTION PANEL
ALBUMIN: 4.3 g/dL (ref 3.6–5.1)
ALT: 65 U/L — ABNORMAL HIGH (ref 9–46)
AST: 40 U/L — ABNORMAL HIGH (ref 10–35)
Alkaline Phosphatase: 70 U/L (ref 40–115)
Bilirubin, Direct: 0.1 mg/dL (ref <=0.2)
Indirect Bilirubin: 0.6 mg/dL (ref 0.2–1.2)
TOTAL PROTEIN: 7.1 g/dL (ref 6.1–8.1)
Total Bilirubin: 0.7 mg/dL (ref 0.2–1.2)

## 2015-03-24 LAB — CK: CK TOTAL: 180 U/L (ref 7–232)

## 2015-05-18 ENCOUNTER — Encounter: Payer: Self-pay | Admitting: Internal Medicine

## 2015-05-18 ENCOUNTER — Ambulatory Visit (INDEPENDENT_AMBULATORY_CARE_PROVIDER_SITE_OTHER): Payer: 59 | Admitting: Internal Medicine

## 2015-05-18 VITALS — BP 136/70 | HR 90 | Temp 97.3°F | Resp 16 | Ht 74.75 in | Wt 274.2 lb

## 2015-05-18 DIAGNOSIS — N182 Chronic kidney disease, stage 2 (mild): Secondary | ICD-10-CM

## 2015-05-18 DIAGNOSIS — E1122 Type 2 diabetes mellitus with diabetic chronic kidney disease: Secondary | ICD-10-CM | POA: Diagnosis not present

## 2015-05-18 DIAGNOSIS — Z87442 Personal history of urinary calculi: Secondary | ICD-10-CM | POA: Diagnosis not present

## 2015-05-18 DIAGNOSIS — Z79899 Other long term (current) drug therapy: Secondary | ICD-10-CM

## 2015-05-18 DIAGNOSIS — I10 Essential (primary) hypertension: Secondary | ICD-10-CM

## 2015-05-18 DIAGNOSIS — E559 Vitamin D deficiency, unspecified: Secondary | ICD-10-CM

## 2015-05-18 DIAGNOSIS — E785 Hyperlipidemia, unspecified: Secondary | ICD-10-CM

## 2015-05-18 LAB — BASIC METABOLIC PANEL WITH GFR
BUN: 20 mg/dL (ref 7–25)
CALCIUM: 9.8 mg/dL (ref 8.6–10.3)
CHLORIDE: 98 mmol/L (ref 98–110)
CO2: 24 mmol/L (ref 20–31)
CREATININE: 0.93 mg/dL (ref 0.70–1.33)
GFR, Est African American: >89 mL/min (ref >=60)
GFR, Est Non African American: >89 mL/min (ref >=60)
Glucose, Bld: 135 mg/dL — ABNORMAL HIGH (ref 65–99)
Potassium: 4.5 mmol/L (ref 3.5–5.3)
SODIUM: 135 mmol/L (ref 135–146)

## 2015-05-18 LAB — CBC WITH DIFFERENTIAL/PLATELET
Basophils Absolute: 0 cells/uL (ref 0–200)
Basophils Relative: 0 %
EOS ABS: 1926 {cells}/uL — AB (ref 15–500)
Eosinophils Relative: 18 %
HEMATOCRIT: 46.1 % (ref 38.5–50.0)
Hemoglobin: 16.4 g/dL (ref 13.2–17.1)
LYMPHS PCT: 29 %
Lymphs Abs: 3103 cells/uL (ref 850–3900)
MCH: 34.1 pg — ABNORMAL HIGH (ref 27.0–33.0)
MCHC: 35.6 g/dL (ref 32.0–36.0)
MCV: 95.8 fL (ref 80.0–100.0)
MONO ABS: 963 {cells}/uL — AB (ref 200–950)
MPV: 10.8 fL (ref 7.5–12.5)
Monocytes Relative: 9 %
NEUTROS PCT: 44 %
Neutro Abs: 4708 cells/uL (ref 1500–7800)
PLATELETS: 235 10*3/uL (ref 140–400)
RBC: 4.81 MIL/uL (ref 4.20–5.80)
RDW: 14.1 % (ref 11.0–15.0)
WBC: 10.7 10*3/uL (ref 3.8–10.8)

## 2015-05-18 LAB — TSH: TSH: 1.1 m[IU]/L (ref 0.40–4.50)

## 2015-05-18 LAB — HEPATIC FUNCTION PANEL
ALBUMIN: 4 g/dL (ref 3.6–5.1)
ALT: 61 U/L — AB (ref 9–46)
AST: 42 U/L — AB (ref 10–35)
Alkaline Phosphatase: 67 U/L (ref 40–115)
BILIRUBIN DIRECT: 0.1 mg/dL (ref <=0.2)
BILIRUBIN TOTAL: 0.5 mg/dL (ref 0.2–1.2)
Indirect Bilirubin: 0.4 mg/dL (ref 0.2–1.2)
Total Protein: 6.6 g/dL (ref 6.1–8.1)

## 2015-05-18 LAB — LIPID PANEL
CHOL/HDL RATIO: 7.2 ratio — AB (ref <=5.0)
CHOLESTEROL: 166 mg/dL (ref 125–200)
HDL: 23 mg/dL — AB (ref >=40)
LDL Cholesterol: 95 mg/dL (ref <130)
Triglycerides: 241 mg/dL — ABNORMAL HIGH (ref <150)
VLDL: 48 mg/dL — ABNORMAL HIGH (ref <30)

## 2015-05-18 LAB — HEMOGLOBIN A1C
HEMOGLOBIN A1C: 6.2 % — AB (ref <5.7)
Mean Plasma Glucose: 131 mg/dL

## 2015-05-18 LAB — MAGNESIUM: MAGNESIUM: 2.2 mg/dL (ref 1.5–2.5)

## 2015-05-18 NOTE — Patient Instructions (Signed)

## 2015-05-18 NOTE — Progress Notes (Signed)
Patient ID: [name], male   DOB: 1960/02/27, 55 y.o.   MRN: [id number]   This very nice 55 y.o. MWM presents for 3 month follow up with Hypertension, Hyperlipidemia, Pre-Diabetes and Vitamin D Deficiency.    Patient is treated for HTN since 1990 & BP has been controlled at home. Today's BP: 136/70 mmHg. Patient has had no complaints of any cardiac type chest pain, palpitations, dyspnea/orthopnea/PND, dizziness, claudication, or dependent edema.   Hyperlipidemia is controlled with diet & meds. Patient denies myalgias or other med SE's. Last Lipids were at goal with  Cholesterol 190; HDL 24*; LDL l NOT CALC; but elevated Triglycerides 565 on 03/24/2015.   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 He does has painful burning  Paresthesias of the sole controlled with Gabapentin.  CBG's usually run betw 100-140's mg%. Last A1c was 6.7% on 02/16/2015.   Further, the patient also has history of Vitamin D Deficiency 56f "24" in 2008 and supplements vitamin D without any suspected side-effects. Last vitamin D was  >150 on   1/26/2017and dose was tapered.  Medication Sig  . albuterol  HFAinhaler Inhale 2 puffs into the lungs every 4 (four) hours as needed for wheezing or shortness of breath.  Marland Kitchen amitriptyline (ELAVIL) 50 MG tablet TAKE ONE-HALF TO ONE TABLET BY MOUTH IN THE EVENING FOR NERVE PAIN  . aspirin 81 MG tablet Take 81 mg by mouth daily.  Marland Kitchen atorvastatin (LIPITOR) 40 MG tablet Take one nightly or as directed  . citalopram (CELEXA) 40 MG tablet TAKE ONE TABLET BY MOUTH ONCE DAILY FOR MOOD  . cyclobenzaprine  10 MG tablet Take 1 tablet (10 mg total) by mouth 3 (three) times daily as needed for muscle spasms.  Marland Kitchen gabapentin 300 MG capsule TAKE TWO TO THREE CAPSULES BY MOUTH AT NIGHT FOR DIABETIC NEURITIS  . hctz 25 MG tablet Take 1 tablet (25 mg total) by mouth daily.  Awanda Mink 5-325  Take 1 tablet by  mouth every 4 (four) hours as needed for moderate pain.  . INVOKAMET [phone] MG TABS TAKE ONE TABLET BY MOUTH TWICE DAILY WITH FOOD  . lisinopril  20 MG tablet Take 1 tablet (20 mg total) by mouth daily.  . 50,000 Unit Patient takes Vit-D3-50 5 days a week.   Allergies  Allergen Reactions  . Morphine And Related   . Codeine Camsylate [Codeine] Rash   PMHx:   Past Medical History  Diagnosis Date  . Hypertension   . Hyperlipidemia   . Type II or unspecified type diabetes mellitus without mention of complication, not stated as uncontrolled   . Vitamin D deficiency   . History of kidney stones   . Diabetic neuropathy ([name])   . Other testicular hypofunction   . History of hepatitis C    Immunization History  Administered Date(s) Administered  . PPD Test 08/31/2013, 11/08/2014  . Td 01/21/2005   Past Surgical History  Procedure Laterality Date  . Cholecystectomy  1987  . Orif tibia fracture     FHx:    Reviewed / unchanged  SHx:    Reviewed / unchanged  Systems Review:  Constitutional: Denies fever, chills, wt changes, headaches, insomnia, fatigue, night sweats, change in appetite. Eyes: Denies redness, blurred vision, diplopia, discharge, itchy, watery eyes.  ENT: Denies discharge, congestion, post nasal drip, epistaxis, sore throat, earache, hearing loss, dental pain, tinnitus, vertigo, sinus pain, snoring.  CV: Denies chest pain, palpitations, irregular heartbeat, syncope, dyspnea, diaphoresis, orthopnea, PND, claudication or edema. Respiratory: denies cough, dyspnea, DOE, pleurisy, hoarseness, laryngitis, wheezing.  Gastrointestinal: Denies dysphagia, odynophagia, heartburn, reflux, water brash, abdominal pain or cramps, nausea, vomiting, bloating, diarrhea, constipation, hematemesis, melena, hematochezia  or hemorrhoids. Genitourinary: Denies dysuria, frequency, urgency, nocturia, hesitancy, discharge, hematuria or flank pain. Musculoskeletal: Denies arthralgias, myalgias,  stiffness, jt. swelling, pain, limping or strain/sprain.  Skin: Denies pruritus, rash, hives, warts, acne, eczema or change in skin lesion(s). Neuro: No weakness, tremor, incoordination, spasms, paresthesia or pain. Psychiatric: Denies confusion, memory loss or sensory loss. Endo: Denies change in weight, skin or hair change.  Heme/Lymph: No excessive bleeding, bruising or enlarged lymph nodes.  Physical Exam  BP 136/70 mmHg  Pulse 90  Temp(Src) 97.3 F (36.3 C) (Temporal)  Resp 16  Ht 6' 2.75" (1.899 m)  Wt 274 lb 3.2 oz (124.376 kg)  BMI 34.49 kg/m2  SpO2 95%  Appears well nourished and in no distress. Eyes: PERRLA, EOMs, conjunctiva no swelling or erythema. Sinuses: No frontal/maxillary tenderness ENT/Mouth: EAC's clear, TM's nl w/o erythema, bulging. Nares clear w/o erythema, swelling, exudates. Oropharynx clear without erythema or exudates. Oral hygiene is good. Tongue normal, non obstructing. Hearing intact.  Neck: Supple. Thyroid nl. Car 2+/2+ without bruits, nodes or JVD. Chest: Respirations nl with BS clear & equal w/o rales, rhonchi, wheezing or stridor.  Cor: Heart sounds normal w/ regular rate and rhythm without sig. murmurs, gallops, clicks, or rubs. Peripheral pulses normal and equal  without edema.  Abdomen: Soft & bowel sounds normal. Non-tender w/o guarding, rebound, hernias, masses, or organomegaly.  Lymphatics: Unremarkable.  Musculoskeletal: Full ROM all peripheral extremities, joint stability, 5/5 strength, and normal gait.  Skin: Warm, dry without exposed rashes, lesions or ecchymosis apparent.  Neuro: Cranial nerves intact, reflexes equal bilaterally. Sensory-motor testing grossly intact. Tendon reflexes grossly intact.  Pysch: Alert & oriented x 3.  Insight and judgement nl & appropriate. No ideations.  Assessment and Plan:  1. Essential hypertension  - TSH  2. Hyperlipidemia  - Lipid panel - TSH  3. T2_NIDDM w/ CKD  - Hemoglobin A1c - Insulin,  random  4. Vitamin D deficiency  - VITAMIN D 25 Hydroxy   5. History of kidney stones   6. Medication management  - CBC with Differential/Platelet - BASIC METABOLIC PANEL WITH GFR - Hepatic function panel - Magnesium   Recommended regular exercise, BP monitoring, weight control, and discussed med and SE's. Recommended labs to assess and monitor clinical status. Further disposition pending results of labs. Over 30 minutes of exam, counseling, chart review was performed

## 2015-05-19 LAB — INSULIN, RANDOM: INSULIN: 27.1 u[IU]/mL — AB (ref 2.0–19.6)

## 2015-05-19 LAB — VITAMIN D 25 HYDROXY (VIT D DEFICIENCY, FRACTURES): VIT D 25 HYDROXY: 86 ng/mL (ref 30–100)

## 2015-05-19 NOTE — Progress Notes (Signed)
Quick Note:  - Vit D 86 - Excellent   - A1c much better - down from 7.1% & 6.7% to 6.2%   - Kidney functions normal / Great   - Liver enzymes slightly elevated and most likely due to Fatty Liver which will ultimately turn into Cirrhosis, but the great news is that with weight loss - can cure fatty liver   - Chol 166 - Excellent , but .............  - Triglycerides (241) or fats in blood are too high goal is - recommend avoid fried & greasy foods, sweets/candy, white rice (brown or wild rice or Quinoa is OK), white potatoes (sweet potatoes are OK) - anything made from white flour - bagels, doughnuts, rolls, buns, biscuits,white and wheat breads, pizza crust and traditional pasta made of white flour & egg white(vegetarian pasta or spinach or wheat pasta is OK). Multi-grain bread is OK - like multi-grain flat bread or sandwich thins. Avoid alcohol in excess. Exercise is also important.  - All else - CBC - Electrolytes - Magnesium & Thyroid all Nl/OK  ______

## 2015-06-22 ENCOUNTER — Other Ambulatory Visit: Payer: Self-pay | Admitting: Internal Medicine

## 2015-07-26 ENCOUNTER — Other Ambulatory Visit: Payer: Self-pay | Admitting: Physician Assistant

## 2015-08-17 ENCOUNTER — Encounter: Payer: Self-pay | Admitting: Physician Assistant

## 2015-08-17 ENCOUNTER — Ambulatory Visit (HOSPITAL_COMMUNITY)
Admission: RE | Admit: 2015-08-17 | Discharge: 2015-08-17 | Disposition: A | Discharge status: Home / Self Care | Payer: 59 | Source: Ambulatory Visit | Attending: Physician Assistant | Admitting: Physician Assistant

## 2015-08-17 ENCOUNTER — Ambulatory Visit (INDEPENDENT_AMBULATORY_CARE_PROVIDER_SITE_OTHER): Payer: 59 | Admitting: Physician Assistant

## 2015-08-17 VITALS — BP 140/70 | HR 87 | Temp 97.9°F | Resp 16 | Ht 74.75 in | Wt 273.8 lb

## 2015-08-17 DIAGNOSIS — E559 Vitamin D deficiency, unspecified: Secondary | ICD-10-CM | POA: Diagnosis not present

## 2015-08-17 DIAGNOSIS — E785 Hyperlipidemia, unspecified: Secondary | ICD-10-CM

## 2015-08-17 DIAGNOSIS — J449 Chronic obstructive pulmonary disease, unspecified: Secondary | ICD-10-CM | POA: Insufficient documentation

## 2015-08-17 DIAGNOSIS — F17200 Nicotine dependence, unspecified, uncomplicated: Secondary | ICD-10-CM

## 2015-08-17 DIAGNOSIS — R05 Cough: Secondary | ICD-10-CM

## 2015-08-17 DIAGNOSIS — E1140 Type 2 diabetes mellitus with diabetic neuropathy, unspecified: Secondary | ICD-10-CM

## 2015-08-17 DIAGNOSIS — E1149 Type 2 diabetes mellitus with other diabetic neurological complication: Secondary | ICD-10-CM

## 2015-08-17 DIAGNOSIS — Z79899 Other long term (current) drug therapy: Secondary | ICD-10-CM | POA: Diagnosis not present

## 2015-08-17 DIAGNOSIS — R059 Cough, unspecified: Secondary | ICD-10-CM

## 2015-08-17 DIAGNOSIS — E1122 Type 2 diabetes mellitus with diabetic chronic kidney disease: Secondary | ICD-10-CM | POA: Diagnosis not present

## 2015-08-17 DIAGNOSIS — N182 Chronic kidney disease, stage 2 (mild): Secondary | ICD-10-CM

## 2015-08-17 DIAGNOSIS — E119 Type 2 diabetes mellitus without complications: Secondary | ICD-10-CM | POA: Insufficient documentation

## 2015-08-17 DIAGNOSIS — I10 Essential (primary) hypertension: Secondary | ICD-10-CM | POA: Diagnosis not present

## 2015-08-17 LAB — HEPATIC FUNCTION PANEL
ALBUMIN: 4 g/dL (ref 3.6–5.1)
ALK PHOS: 71 U/L (ref 40–115)
ALT: 44 U/L (ref 9–46)
AST: 29 U/L (ref 10–35)
BILIRUBIN DIRECT: 0.1 mg/dL (ref <=0.2)
BILIRUBIN INDIRECT: 0.4 mg/dL (ref 0.2–1.2)
BILIRUBIN TOTAL: 0.5 mg/dL (ref 0.2–1.2)
Total Protein: 6.8 g/dL (ref 6.1–8.1)

## 2015-08-17 LAB — BASIC METABOLIC PANEL WITH GFR
BUN: 18 mg/dL (ref 7–25)
CHLORIDE: 103 mmol/L (ref 98–110)
CO2: 22 mmol/L (ref 20–31)
Calcium: 9 mg/dL (ref 8.6–10.3)
Creat: 0.82 mg/dL (ref 0.70–1.33)
GFR, Est African American: >89 mL/min (ref >=60)
Glucose, Bld: 152 mg/dL — ABNORMAL HIGH (ref 65–99)
POTASSIUM: 4.5 mmol/L (ref 3.5–5.3)
Sodium: 138 mmol/L (ref 135–146)

## 2015-08-17 LAB — CBC WITH DIFFERENTIAL/PLATELET
BASOS PCT: 0 %
Basophils Absolute: 0 cells/uL (ref 0–200)
EOS ABS: 86 {cells}/uL (ref 15–500)
Eosinophils Relative: 1 %
HEMATOCRIT: 47.7 % (ref 38.5–50.0)
Hemoglobin: 16.7 g/dL (ref 13.2–17.1)
Lymphocytes Relative: 28 %
Lymphs Abs: 2408 cells/uL (ref 850–3900)
MCH: 33.4 pg — ABNORMAL HIGH (ref 27.0–33.0)
MCHC: 35 g/dL (ref 32.0–36.0)
MCV: 95.4 fL (ref 80.0–100.0)
MONO ABS: 860 {cells}/uL (ref 200–950)
MONOS PCT: 10 %
MPV: 11.6 fL (ref 7.5–12.5)
NEUTROS ABS: 5246 {cells}/uL (ref 1500–7800)
Neutrophils Relative %: 61 %
PLATELETS: 189 10*3/uL (ref 140–400)
RBC: 5 MIL/uL (ref 4.20–5.80)
RDW: 13.8 % (ref 11.0–15.0)
WBC: 8.6 10*3/uL (ref 3.8–10.8)

## 2015-08-17 LAB — HEMOGLOBIN A1C
HEMOGLOBIN A1C: 6.5 % — AB (ref <5.7)
Mean Plasma Glucose: 140 mg/dL

## 2015-08-17 LAB — LIPID PANEL
CHOL/HDL RATIO: 6 ratio — AB (ref <=5.0)
Cholesterol: 181 mg/dL (ref 125–200)
HDL: 30 mg/dL — AB (ref >=40)
LDL CALC: 101 mg/dL (ref <130)
TRIGLYCERIDES: 251 mg/dL — AB (ref <150)
VLDL: 50 mg/dL — AB (ref <30)

## 2015-08-17 LAB — MAGNESIUM: Magnesium: 2.1 mg/dL (ref 1.5–2.5)

## 2015-08-17 LAB — TSH: TSH: 1.73 mIU/L (ref 0.40–4.50)

## 2015-08-17 MED ORDER — LISINOPRIL 20 MG PO TABS: 10.0000 mg | ORAL_TABLET | Freq: Every day | ORAL | 1 refills | Status: DC

## 2015-08-17 NOTE — Patient Instructions (Addendum)
Can try melatonin 10-20mg  30 mins before bed, natural sleep aid, get the gummy or dissovable.  Increase lisinopril to  daily Get chest x ray  If you have a smart phone, please look up Smoke Free app, this will help you stay on track and give you information about money you have saved, life that you have gained back and a ton of more information.   We are giving you chantix for smoking cessation. You can do it! And we are here to help! You may have heard some scary side effects about chantix, the three most common I hear about are nausea, crazy dreams and depression.  However, I like for my patients to try to stay on 1/2 a tablet twice a day rather than one tablet twice a day as normally prescribed. This helps decrease the chances of side effects and helps save money by making a one month prescription last two months  Please start the prescription this way:  Start 1/2 tablet by mouth once daily after food with a full glass of water for 3 days Then do 1/2 tablet by mouth twice daily for 4 days. During this first week you can smoke, but try to stop after this week.  At this point we have several options: 1) continue on 1/2 tablet twice a day- which I encourage you to do. You can stay on this dose the rest of the time on the medication or if you still feel the need to smoke you can do one of the two options below. 2) do one tablet in the morning and 1/2 in the evening which helps decrease dreams. 3) do one tablet twice a day.   What if I miss a dose? If you miss a dose, take it as soon as you can. If it is almost time for your next dose, take only that dose. Do not take double or extra doses.  What should I watch for while using this medicine? Visit your doctor or health care professional for regular check ups. Ask for ongoing advice and encouragement from your doctor or healthcare professional, friends, and family to help you quit. If you smoke while on this medication, quit again  Your  mouth may get dry. Chewing sugarless gum or hard candy, and drinking plenty of water may help. Contact your doctor if the problem does not go away or is severe.  You may get drowsy or dizzy. Do not drive, use machinery, or do anything that needs mental alertness until you know how this medicine affects you. Do not stand or sit up quickly, especially if you are an older patient.   The use of this medicine may increase the chance of suicidal thoughts or actions. Pay special attention to how you are responding while on this medicine. Any worsening of mood, or thoughts of suicide or dying should be reported to your health care professional right away.  ADVANTAGES OF QUITTING SMOKING  Within 20 minutes, blood pressure decreases. Your pulse is at normal level.  After 8 hours, carbon monoxide levels in the blood return to normal. Your oxygen level increases.  After 24 hours, the chance of having a heart attack starts to decrease. Your breath, hair, and body stop smelling like smoke.  After 48 hours, damaged nerve endings begin to recover. Your sense of taste and smell improve.  After 72 hours, the body is virtually free of nicotine. Your bronchial tubes relax and breathing becomes easier.  After 2 to 12 weeks, lungs can  hold more air. Exercise becomes easier and circulation improves.  After 1 year, the risk of coronary heart disease is cut in half.  After 5 years, the risk of stroke falls to the same as a nonsmoker.  After 10 years, the risk of lung cancer is cut in half and the risk of other cancers decreases significantly.  After 15 years, the risk of coronary heart disease drops, usually to the level of a nonsmoker.  You will have extra money to spend on things other than cigarettes.

## 2015-08-17 NOTE — Progress Notes (Signed)
Assessment and Plan:   Essential hypertension - continue medications, DASH diet, exercise and monitor at home. Call if greater than 130/80.  - INCREASE LISINOPRIL TO   -     CBC with Differential/Platelet -     BASIC METABOLIC PANEL WITH GFR -     Hepatic function panel -     TSH  Hyperlipidemia -continue medications, check lipids, decrease fatty foods, increase activity.  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 -     BASIC METABOLIC PANEL WITH GFR -     Hemoglobin A1c  Morbid obesity, unspecified obesity type ([name]) Obesity with co morbidities- long discussion about weight loss, diet, and exercise  T2_NIDDM w/Peripheral Neuropathy - has stopped meds, likely due to better glucose control/weight loss -     Hemoglobin A1c  Vitamin D deficiency -     VITAMIN D 25 Hydroxy (Vit-D Deficiency, Fractures)  Medication management -     Magnesium  Tobacco use disorder/cough Smoking cessation-  counseled patient on the dangers of tobacco use, advised patient to stop smoking, and reviewed strategies to maximize success, declined medication to help, will call if changes his mind -     DG Chest 2 View; Future  Other orders -     lisinopril (PRINIVIL,ZESTRIL) 20 MG tablet; Take 0.5 tablets (10 mg total) by mouth daily.   Continue diet and meds as discussed. Further disposition pending results of labs. Discussed med's effects and SE's.   HPI 55 y.o. male  presents for 3 month follow up with hypertension, hyperlipidemia, diabetes and vitamin D. His blood pressure has been controlled at home, he is on the  lisinopril, today their BP is BP: 140/70 He does not workout but is active at work. He denies chest pain, shortness of breath, dizziness.  He is not on  cholesterol medication and denies myalgias. His cholesterol is at goal. The cholesterol was:  05/18/2015: Cholesterol 166; HDL 23; LDL Cholesterol 95; Triglycerides 241 He has been working on diet and exercise for Diabetes with p. neuropathy and CKD, on gabapentin and elavil for neuropathy but stopped last visit and states pain is better which he states helps, he is on invokamet but he is on ACE/ARB, is on bASA, and denies polydipsia, polyuria and visual disturbances. Last A1C  was: 05/18/2015: Hgb A1c MFr Bld 6.2 Patient is on Vitamin D supplement. 05/18/2015: Vit D, 25-Hydroxy 86  Patient is morbidly obese with BMI is Body mass index is 34.45 kg/m., he is working on diet and exercise.  Wt Readings from Last 3 Encounters:  08/17/15 273 lb 12.8 oz (124.2 kg)  05/18/15 274 lb 3.2 oz (124.4 kg)  02/16/15 286 lb (129.7 kg)   Current Medications:  Current Outpatient Prescriptions on File Prior to Visit  Medication Sig  . aspirin 81 MG tablet Take 81 mg by mouth daily.  . citalopram (CELEXA) 40 MG tablet TAKE ONE TABLET BY MOUTH ONCE DAILY FOR MOOD  . cyclobenzaprine (FLEXERIL) 10 MG tablet Take 1 tablet (10 mg total) by mouth 3 (three) times daily as needed for muscle spasms.  . hydrochlorothiazide (HYDRODIURIL) 25 MG tablet Take 1 tablet (25 mg total) by mouth daily.  Marland Kitchen HYDROcodone-acetaminophen (NORCO/VICODIN) 5-325 MG per tablet Take 1 tablet by mouth every 4 (four) hours as needed for moderate pain.  Marland Kitchen  INVOKAMET [phone] MG TABS TAKE ONE TABLET BY MOUTH TWICE DAILY WITH FOOD  . lisinopril (PRINIVIL,ZESTRIL) 10 MG tablet TAKE ONE TABLET BY MOUTH ONCE DAILY  . OVER THE COUNTER MEDICATION 50,000 Units. Patient takes Vit-D3-50 5 days a week.   No current facility-administered medications on file prior to visit.     Medical History:  Past Medical History:  Diagnosis Date  . Diabetic neuropathy ([name])   . History of hepatitis C   . History of kidney stones   . Hyperlipidemia   . Hypertension    . Other testicular hypofunction   . Type II or unspecified type diabetes mellitus without mention of complication, not stated as uncontrolled   . Vitamin D deficiency    Allergies:  Allergies  Allergen Reactions  . Morphine And Related   . Codeine Camsylate [Codeine] Rash    Review of Systems  Constitutional: Negative for chills and fever.  Respiratory: Positive for cough and sputum production. Negative for hemoptysis, shortness of breath and wheezing.   Cardiovascular: Negative for chest pain, palpitations, orthopnea, claudication, leg swelling and PND.  Genitourinary: Negative for frequency.  Neurological: Positive for sensory change. Negative for dizziness, tingling, tremors, speech change, focal weakness, seizures and loss of consciousness.  All other systems reviewed and are negative.  Family history- Review and unchanged Social history- Review and unchanged Physical Exam: BP 140/70   Pulse 87   Temp 97.9 F (36.6 C) (Temporal)   Resp 16   Ht 6' 2.75" (1.899 m)   Wt 273 lb 12.8 oz (124.2 kg)   SpO2 97%   BMI 34.45 kg/m  Wt Readings from Last 3 Encounters:  08/17/15 273 lb 12.8 oz (124.2 kg)  05/18/15 274 lb 3.2 oz (124.4 kg)  02/16/15 286 lb (129.7 kg)   General Appearance: Well nourished, in no apparent distress. Eyes: PERRLA, EOMs, conjunctiva no swelling or erythema Sinuses: No Frontal/maxillary tenderness ENT/Mouth: Ext aud canals clear, TMs without erythema, bulging. No erythema, swelling, or exudate on post pharynx.  Tonsils not swollen or erythematous. Hearing normal.  Neck: Supple, thyroid normal.  Respiratory: Respiratory effort normal, BS equal bilaterally with wheezing/rhonchi without rales, rhonchi, or stridor.  Cardio: RRR with no MRGs. Decreased bilateral peripheral pulses without edema with varicose veins.  Abdomen: Soft, + BS, obese  Non tender, no guarding, rebound, hernias, masses. Lymphatics: Non tender without lymphadenopathy.  Musculoskeletal:  Full ROM, 5/5 strength, normal gait.  Skin: Warm, dry without rashes, lesions, ecchymosis.  Neuro: Cranial nerves intact. No cerebellar symptoms. Sensation decreased bilateral legs to knees. Negative phalens and tinels sign, good bilateral strength.  Psych: Awake and oriented X 3, normal affect, Insight and Judgment appropriate.    Quentin Mulling, PA-C 8:43 AM [facility]

## 2015-08-18 LAB — VITAMIN D 25 HYDROXY (VIT D DEFICIENCY, FRACTURES): Vit D, 25-Hydroxy: 61 ng/mL (ref 30–100)

## 2015-09-26 ENCOUNTER — Other Ambulatory Visit: Payer: Self-pay | Admitting: Physician Assistant

## 2015-09-26 DIAGNOSIS — I10 Essential (primary) hypertension: Secondary | ICD-10-CM

## 2015-10-29 ENCOUNTER — Other Ambulatory Visit: Payer: Self-pay | Admitting: Physician Assistant

## 2015-11-02 ENCOUNTER — Other Ambulatory Visit: Payer: Self-pay | Admitting: *Deleted

## 2015-11-02 MED ORDER — CITALOPRAM HYDROBROMIDE 40 MG PO TABS: ORAL_TABLET | ORAL | 1 refills | Status: DC

## 2015-11-21 ENCOUNTER — Ambulatory Visit: Payer: Self-pay | Admitting: Internal Medicine

## 2015-11-21 ENCOUNTER — Encounter: Payer: Self-pay | Admitting: Internal Medicine

## 2015-11-21 ENCOUNTER — Emergency Department (HOSPITAL_COMMUNITY)
Admission: EM | Admit: 2015-11-21 | Discharge: 2015-11-21 | Disposition: A | Discharge status: Home / Self Care | Payer: 59 | Attending: Emergency Medicine | Admitting: Emergency Medicine

## 2015-11-21 ENCOUNTER — Emergency Department (HOSPITAL_COMMUNITY): Payer: 59

## 2015-11-21 ENCOUNTER — Encounter (HOSPITAL_COMMUNITY): Payer: Self-pay | Admitting: Emergency Medicine

## 2015-11-21 ENCOUNTER — Ambulatory Visit (INDEPENDENT_AMBULATORY_CARE_PROVIDER_SITE_OTHER): Payer: 59 | Admitting: Internal Medicine

## 2015-11-21 VITALS — BP 110/76 | HR 84 | Temp 97.3°F | Resp 16 | Ht 74.75 in | Wt 275.4 lb

## 2015-11-21 DIAGNOSIS — I10 Essential (primary) hypertension: Secondary | ICD-10-CM | POA: Insufficient documentation

## 2015-11-21 DIAGNOSIS — F1290 Cannabis use, unspecified, uncomplicated: Secondary | ICD-10-CM | POA: Diagnosis not present

## 2015-11-21 DIAGNOSIS — Z7982 Long term (current) use of aspirin: Secondary | ICD-10-CM | POA: Insufficient documentation

## 2015-11-21 DIAGNOSIS — J449 Chronic obstructive pulmonary disease, unspecified: Secondary | ICD-10-CM | POA: Diagnosis not present

## 2015-11-21 DIAGNOSIS — R100 Acute abdomen: Secondary | ICD-10-CM

## 2015-11-21 DIAGNOSIS — Z7984 Long term (current) use of oral hypoglycemic drugs: Secondary | ICD-10-CM | POA: Insufficient documentation

## 2015-11-21 DIAGNOSIS — Z79899 Other long term (current) drug therapy: Secondary | ICD-10-CM | POA: Insufficient documentation

## 2015-11-21 DIAGNOSIS — E119 Type 2 diabetes mellitus without complications: Secondary | ICD-10-CM | POA: Insufficient documentation

## 2015-11-21 DIAGNOSIS — K5732 Diverticulitis of large intestine without perforation or abscess without bleeding: Secondary | ICD-10-CM

## 2015-11-21 DIAGNOSIS — R1030 Lower abdominal pain, unspecified: Secondary | ICD-10-CM | POA: Diagnosis present

## 2015-11-21 DIAGNOSIS — Z87891 Personal history of nicotine dependence: Secondary | ICD-10-CM | POA: Diagnosis not present

## 2015-11-21 LAB — COMPREHENSIVE METABOLIC PANEL
ALK PHOS: 55 U/L (ref 38–126)
ALT: 39 U/L (ref 17–63)
ANION GAP: 10 (ref 5–15)
AST: 30 U/L (ref 15–41)
Albumin: 4 g/dL (ref 3.5–5.0)
BILIRUBIN TOTAL: 1.1 mg/dL (ref 0.3–1.2)
BUN: 21 mg/dL — ABNORMAL HIGH (ref 6–20)
CALCIUM: 9.2 mg/dL (ref 8.9–10.3)
CO2: 23 mmol/L (ref 22–32)
Chloride: 101 mmol/L (ref 101–111)
Creatinine, Ser: 0.95 mg/dL (ref 0.61–1.24)
GFR calc non Af Amer: >60 mL/min (ref >60)
Glucose, Bld: 143 mg/dL — ABNORMAL HIGH (ref 65–99)
Potassium: 4 mmol/L (ref 3.5–5.1)
Sodium: 134 mmol/L — ABNORMAL LOW (ref 135–145)
TOTAL PROTEIN: 7.6 g/dL (ref 6.5–8.1)

## 2015-11-21 LAB — URINALYSIS, ROUTINE W REFLEX MICROSCOPIC
Glucose, UA: >1000 mg/dL — AB
Hgb urine dipstick: NEGATIVE
Ketones, ur: NEGATIVE mg/dL
LEUKOCYTES UA: NEGATIVE
NITRITE: NEGATIVE
Protein, ur: NEGATIVE mg/dL
SPECIFIC GRAVITY, URINE: 1.041 — AB (ref 1.005–1.030)
pH: 5.5 (ref 5.0–8.0)

## 2015-11-21 LAB — URINE MICROSCOPIC-ADD ON
BACTERIA UA: NONE SEEN
RBC / HPF: NONE SEEN RBC/hpf (ref 0–5)
WBC UA: NONE SEEN WBC/hpf (ref 0–5)

## 2015-11-21 LAB — CBC
HCT: 48.2 % (ref 39.0–52.0)
HEMOGLOBIN: 16.8 g/dL (ref 13.0–17.0)
MCH: 34.1 pg — ABNORMAL HIGH (ref 26.0–34.0)
MCHC: 34.9 g/dL (ref 30.0–36.0)
MCV: 98 fL (ref 78.0–100.0)
Platelets: 169 10*3/uL (ref 150–400)
RBC: 4.92 MIL/uL (ref 4.22–5.81)
RDW: 13.9 % (ref 11.5–15.5)
WBC: 15.3 10*3/uL — AB (ref 4.0–10.5)

## 2015-11-21 LAB — LIPASE, BLOOD: Lipase: 22 U/L (ref 11–51)

## 2015-11-21 MED ORDER — ONDANSETRON 4 MG PO TBDP: 4.0000 mg | ORAL_TABLET | Freq: Three times a day (TID) | ORAL | 0 refills | Status: DC | PRN

## 2015-11-21 MED ORDER — METRONIDAZOLE 500 MG PO TABS
500.0000 mg | ORAL_TABLET | Freq: Once | ORAL | Status: AC
  Administered 2015-11-21: 500 mg via ORAL
  Filled 2015-11-21: qty 1

## 2015-11-21 MED ORDER — IOPAMIDOL (ISOVUE-300) INJECTION 61%
30.0000 mL | Freq: Once | INTRAVENOUS | Status: AC | PRN
  Administered 2015-11-21: 30 mL via ORAL

## 2015-11-21 MED ORDER — METRONIDAZOLE 500 MG PO TABS: 500.0000 mg | ORAL_TABLET | Freq: Two times a day (BID) | ORAL | 0 refills | Status: DC

## 2015-11-21 MED ORDER — HYDROMORPHONE HCL 1 MG/ML IJ SOLN
1.0000 mg | Freq: Once | INTRAMUSCULAR | Status: AC
  Administered 2015-11-21: 1 mg via INTRAVENOUS
  Filled 2015-11-21: qty 1

## 2015-11-21 MED ORDER — IOPAMIDOL (ISOVUE-300) INJECTION 61%
100.0000 mL | Freq: Once | INTRAVENOUS | Status: AC | PRN
  Administered 2015-11-21: 100 mL via INTRAVENOUS

## 2015-11-21 MED ORDER — OXYCODONE-ACETAMINOPHEN 5-325 MG PO TABS: 1.0000 | ORAL_TABLET | ORAL | 0 refills | Status: DC | PRN

## 2015-11-21 MED ORDER — CIPROFLOXACIN HCL 500 MG PO TABS: 500.0000 mg | ORAL_TABLET | Freq: Two times a day (BID) | ORAL | 0 refills | Status: DC

## 2015-11-21 MED ORDER — HYDROMORPHONE HCL 1 MG/ML IJ SOLN
1.0000 mg | Freq: Once | INTRAMUSCULAR | Status: AC
  Administered 2015-11-21: 1 mg via INTRAVENOUS
  Filled 2015-11-21 (×2): qty 1

## 2015-11-21 MED ORDER — CIPROFLOXACIN HCL 500 MG PO TABS
500.0000 mg | ORAL_TABLET | Freq: Once | ORAL | Status: AC
  Administered 2015-11-21: 500 mg via ORAL
  Filled 2015-11-21: qty 1

## 2015-11-21 NOTE — ED Notes (Signed)
Pt transported to CT ?

## 2015-11-21 NOTE — ED Triage Notes (Signed)
Patient from PCP for rule out of acute abdomen. Reports abdominal discomfort and pain for 2 weeks. Recent vomiting but has ceased. Denies Fever, n/v/d. Hx of diverticulitis. Reports having decreased appetite.

## 2015-11-21 NOTE — ED Notes (Signed)
Gave patient UA cup 

## 2015-11-21 NOTE — Progress Notes (Signed)
  [facility]   [name], M.D.    Dyanne CarrelAmanda R. Steffanie Dunnollier, P.A.-C      Terri Piedraourtney Forcucci, P.A.-C  [facility]                [address]                [place] [id number] Telephone [phone] Telefax [phone]) [phone] Subjective:    Patient ID: [name], male    DOB: 03-22-1960, 55 y.o.   MRN: [id number]  HPI  This very nice stoic 55 yo MWM w/HTN, HLD, T2_DM presented with 2 week hx/o diffuse abdominal pain crescendo over the last 2-3 days. C/o pain bilat testes w/cough. Some N, no emesis. Hx/o Diverticulitis.   Medication Sig  . aspirin 81 MG tablet Take 81 mg  daily.  . citalopram  40 MG tablet TAKE ONE TAB ONCE DAILY FOR  MOOD  . cyclobenzaprine  10 MG tablet Take 1 tab 3  times daily as needed for muscle spasms.  . hctz 25 MG tablet TAKE ONE TAB ONCE DAILY  . VICODIN 5-325 MG  Take 1 tabevery 4  hours as needed for moderate pain.  . INVOKAMET [phone] MG TABS TAKE ONE TAB TWICE DAILY WITH FOOD  . lisinopril  20 MG tablet Take 0.5 tablets (10 mg total) by mouth daily.  . Vit-D3  50,000 Units.  Patient takes 5 days a week.   Allergies  Allergen Reactions  . Morphine And Related   . Codeine Camsylate [Codeine] Rash   Past Medical History:  Diagnosis Date  . Diabetic neuropathy ([name])   . History of hepatitis C   . History of kidney stones   . Hyperlipidemia   . Hypertension   . Other testicular hypofunction   . Type II or unspecified type diabetes mellitus without mention of complication, not stated as uncontrolled   . Vitamin D deficiency    Review of Systems  In addition to the HPI above,  No Fever-chills,  No Headache, No changes with Vision or hearing,  No problems swallowing food or Liquids,  No Chest pain or productive Cough or Shortness of Breath,  No Blood in stool or Urine,  No dysuria,  No new skin rashes or bruises,  No new joints pains-aches,  No new weakness, tingling,  numbness in any extremity,  No recent weight loss,  No polyuria, polydypsia or polyphagia,  A full 10 point Review of Systems was done, except as stated above, all other Review of Systems were negative    Objective:   Physical Exam  BP 110/76   Pulse 84   Temp 97.3 F (36.3 C)   Resp 16   Ht 6' 2.75" (1.899 m)   Wt 275 lb 6.4 oz (124.9 kg)   BMI 34.65 kg/m   HEENT - Eac's patent. TM's Nl. EOM's full. PERRLA. NasoOroPharynx clear. Neck - supple. Nl Thyroid. Carotids 2+ & No bruits, nodes, JVD Chest - Clear. Cor - Nl HS. RRR w/o sig M. Abd - Rotund. Generalized diffuse tenderness with rebound. BS hypoactive.  MS- FROM w/o deformities. Muscle power, tone and bulk Nl. Gait Nl. Neuro -  Nl w/o focal abnormalities.    Assessment & Plan:   1. Acute abdomen  - Patient referred to ER for diagnostic testing, CT scans, etc.

## 2015-11-21 NOTE — ED Provider Notes (Signed)
[facility]-EMERGENCY DEPT Provider Note   CSN: [id number] Arrival date & time: 11/21/15  1233     History   Chief Complaint Chief Complaint  Patient presents with  . Abdominal Pain    HPI [name] is a 55 y.o. male.  The history is provided by the patient and medical records.  Abdominal Pain     55 y.o. M with hx of hep C, kidney stones, HTN, DM2, vit D deficiency, presenting to the ED for abdominal pain.Patient states a few weeks ago he did have some nausea and vomiting however this seems to have resolved. He reports for the past 3 days he has had worsening lower abdominal pain. States the stretches all across his lower abdomen, however worse in the suprapubic region and his left lower quadrant. He denies any fever or chills. Known nausea, vomiting, or diarrhea recently. Prior abdominal surgeries include cholecystectomy.  Past Medical History:  Diagnosis Date  . Diabetic neuropathy ([name])   . History of hepatitis C   . History of kidney stones   . Hyperlipidemia   . Hypertension   . Other testicular hypofunction   . Type II or unspecified type diabetes mellitus without mention of complication, not stated as uncontrolled   . Vitamin D deficiency     Patient Active Problem List   Diagnosis Date Noted  . Tobacco use disorder 08/17/2015  . COPD (chronic obstructive pulmonary disease) ([name]) 08/17/2015  . 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 T2_NIDDM w/ CKD 08/31/2013  . Medication management 05/19/2013  . Morbid obesity (BMI 35) 05/19/2013  . Hypertension   . Hyperlipidemia   . Vitamin D deficiency   . History of kidney stones   . Testosterone deficiency   . History of hepatitis C     Past Surgical History:  Procedure Laterality Date  . CHOLECYSTECTOMY  1987  . ORIF TIBIA FRACTURE         Home Medications    Prior to Admission medications   Medication Sig Start Date End Date Taking? Authorizing Provider  aspirin 81 MG tablet Take 81 mg by mouth  daily.    Historical Provider, MD  citalopram (CELEXA) 40 MG tablet TAKE ONE TABLET BY MOUTH ONCE DAILY FOR  MOOD 11/02/15   Lucky CowboyWilliam McKeown, MD  cyclobenzaprine (FLEXERIL) 10 MG tablet Take 1 tablet (10 mg total) by mouth 3 (three) times daily as needed for muscle spasms. 08/09/13   Quentin MullingAmanda Collier, PA-C  hydrochlorothiazide (HYDRODIURIL) 25 MG tablet TAKE ONE TABLET BY MOUTH ONCE DAILY 09/26/15   Lucky CowboyWilliam McKeown, MD  HYDROcodone-acetaminophen (NORCO/VICODIN) 5-325 MG per tablet Take 1 tablet by mouth every 4 (four) hours as needed for moderate pain. 12/22/13   Quentin MullingAmanda Collier, PA-C  INVOKAMET [phone] MG TABS TAKE ONE TABLET BY MOUTH TWICE DAILY WITH FOOD 11/28/14   Lucky CowboyWilliam McKeown, MD  lisinopril (PRINIVIL,ZESTRIL) 20 MG tablet Take 0.5 tablets (10 mg total) by mouth daily. 08/17/15   Quentin MullingAmanda Collier, PA-C  OVER THE COUNTER MEDICATION 50,000 Units. Patient takes Vit-D3-50 5 days a week.    Historical Provider, MD    Family History Family History  Problem Relation Age of Onset  . Hypertension Mother   . Cancer Father     colon  . Alzheimer's disease Father   . Stroke Father     Social History Social History  Substance Use Topics  . Smoking status: Former Smoker    Packs/day: 0.75    Types: Cigarettes    Quit date: 11/10/2015  .  Smokeless tobacco: Never Used  . Alcohol use No     Allergies   Morphine and related and Codeine camsylate [codeine]   Review of Systems Review of Systems  Gastrointestinal: Positive for abdominal pain.  All other systems reviewed and are negative.    Physical Exam Updated Vital Signs BP (!) 153/138 (BP Location: Right Arm)   Pulse 86   Temp 97.7 F (36.5 C) (Oral)   Resp 18   Ht 6\' 2"  (1.88 m)   Wt 124.7 kg   SpO2 98%   BMI 35.31 kg/m   Physical Exam  Constitutional: He is oriented to person, place, and time. He appears well-developed and well-nourished.  HENT:  Head: Normocephalic and atraumatic.  Mouth/Throat: Oropharynx is clear and  moist.  Eyes: Conjunctivae and EOM are normal. Pupils are equal, round, and reactive to light.  Neck: Normal range of motion.  Cardiovascular: Normal rate, regular rhythm and normal heart sounds.   Pulmonary/Chest: Effort normal and breath sounds normal.  Abdominal: Soft. Bowel sounds are normal. There is tenderness in the suprapubic area and left lower quadrant.  Musculoskeletal: Normal range of motion.  Neurological: He is alert and oriented to person, place, and time.  Skin: Skin is warm and dry.  Psychiatric: He has a normal mood and affect.  Nursing note and vitals reviewed.    ED Treatments / Results  Labs (all labs ordered are listed, but only abnormal results are displayed) Labs Reviewed  COMPREHENSIVE METABOLIC PANEL - Abnormal; Notable for the following:       Result Value   Sodium 134 (*)    Glucose, Bld 143 (*)    BUN 21 (*)    All other components within normal limits  CBC - Abnormal; Notable for the following:    WBC 15.3 (*)    MCH 34.1 (*)    All other components within normal limits  LIPASE, BLOOD  URINALYSIS, ROUTINE W REFLEX MICROSCOPIC (NOT AT [facility])    EKG  EKG Interpretation None       Radiology Ct Abdomen Pelvis W Contrast  Result Date: 11/21/2015 CLINICAL DATA:  Abdominal discomfort and pain for 2 weeks. Recent vomiting. History of diverticulitis. EXAM: CT ABDOMEN AND PELVIS WITH CONTRAST TECHNIQUE: Multidetector CT imaging of the abdomen and pelvis was performed using the standard protocol following bolus administration of intravenous contrast. CONTRAST:  100mL ISOVUE-300 IOPAMIDOL (ISOVUE-300) INJECTION 61%, 30mL ISOVUE-300 IOPAMIDOL (ISOVUE-300) INJECTION 61% COMPARISON:  11/01/2010 FINDINGS: Lower chest: The lung bases are clear of acute process. No pleural effusion or pulmonary lesions. The heart is normal in size. No pericardial effusion. The distal esophagus and aorta are unremarkable. Hepatobiliary: No focal hepatic lesions or intrahepatic  biliary dilatation. The gallbladder is surgically absent. No common bile duct dilatation. Pancreas: No mass, inflammation or ductal dilatation. The spleen is normal in size. No focal lesions. Spleen: Normal size.  No focal lesions. Adrenals/Urinary Tract: The adrenal glands and kidneys are unremarkable. No inflammatory changes, renal or obstructing ureteral calculi. No renal mass. Stomach/Bowel: The stomach, duodenum and small bowel are unremarkable. The terminal ileum is normal. The appendix is normal. Acute uncomplicated diverticulitis involving the mid sigmoid colon with wall thickening, inflammation and extensive pericolonic interstitial change. No free air or abscess. The remainder of the colon is unremarkable. Vascular/Lymphatic: Moderate atherosclerotic calcifications involving the proximal iliac arteries. The aorta is normal in caliber. The branch vessels are patent. The major venous structures are patent. Small scattered mesenteric and retroperitoneal lymph nodes but no mass  or adenopathy. Reproductive: The prostate gland and seminal vesicles are unremarkable. Other: Small left inguinal hernia containing fat. No abdominal wall hernia or subcutaneous lesions. Musculoskeletal: No significant bony findings. Bilateral pars defects are noted at L5 with a grade 1 spondylolisthesis. IMPRESSION: Acute uncomplicated diverticulitis involving the mid sigmoid colon. Electronically Signed   By: Rudie Meyer M.D.   On: 11/21/2015 18:15    Procedures Procedures (including critical care time)  Medications Ordered in ED Medications - No data to display   Initial Impression / Assessment and Plan / ED Course  I have reviewed the triage vital signs and the nursing notes.  Pertinent labs & imaging results that were available during my care of the patient were reviewed by me and considered in my medical decision making (see chart for details).  Clinical Course   55 year old male who with lower abdominal pain  the past 3 days. Reports some nausea and vomiting a few weeks ago, none currently. Here he is afebrile and nontoxic. He does have some tenderness in the lower abdomen, worse in the suprapubic and left lower quadrant. Lab work with leukocytosis noted. UA is noninfectious. CT scan obtained revealing uncomplicated diverticulitis. Patient's pain is well controlled here. He is tolerating oral antibiotics. His vital signs remained stable. Feel he is stable for discharge with outpatient treatment. Will start on Cipro, Flagyl, Percocet, Zofran. He was instructed to follow-up with his primary care doctor.  Discussed plan with patient, he acknowledged understanding and agreed with plan of care.  Return precautions given for new or worsening symptoms.  Final Clinical Impressions(s) / ED Diagnoses   Final diagnoses:  Diverticulitis of large intestine without perforation or abscess without bleeding    New Prescriptions Discharge Medication List as of 11/21/2015  7:43 PM    START taking these medications   Details  ciprofloxacin (CIPRO) 500 MG tablet Take 1 tablet (500 mg total) by mouth every 12 (twelve) hours., Starting Tue 11/21/2015, Print    metroNIDAZOLE (FLAGYL) 500 MG tablet Take 1 tablet (500 mg total) by mouth 2 (two) times daily., Starting Tue 11/21/2015, Print    ondansetron (ZOFRAN ODT) 4 MG disintegrating tablet Take 1 tablet (4 mg total) by mouth every 8 (eight) hours as needed for nausea., Starting Tue 11/21/2015, Print    oxyCODONE-acetaminophen (PERCOCET/ROXICET) 5-325 MG tablet Take 1 tablet by mouth every 4 (four) hours as needed., Starting Tue 11/21/2015, Print         Garlon Hatchet, PA-C 11/21/15 2004    Jacalyn Lefevre, MD 11/22/15 [phone]

## 2015-11-21 NOTE — Discharge Instructions (Signed)
Take the prescribed medication as directed. °Follow-up with your primary care doctor. °Return to the ED for new or worsening symptoms. °

## 2015-11-24 ENCOUNTER — Ambulatory Visit (INDEPENDENT_AMBULATORY_CARE_PROVIDER_SITE_OTHER): Payer: 59 | Admitting: Internal Medicine

## 2015-11-24 ENCOUNTER — Encounter: Payer: Self-pay | Admitting: Internal Medicine

## 2015-11-24 VITALS — BP 128/70 | HR 76 | Temp 98.2°F | Resp 18 | Ht 74.75 in | Wt 275.0 lb

## 2015-11-24 DIAGNOSIS — K5732 Diverticulitis of large intestine without perforation or abscess without bleeding: Secondary | ICD-10-CM

## 2015-11-24 MED ORDER — ONDANSETRON 4 MG PO TBDP: 4.0000 mg | ORAL_TABLET | Freq: Three times a day (TID) | ORAL | 0 refills | Status: DC | PRN

## 2015-11-24 NOTE — Progress Notes (Signed)
Assessment and Plan:   1. Diverticulitis of colon -finish abx -appears to clinically be improving -if severe pain or unable to keep down abx needs to go back to hospital -discussed once resolution will need to maintain high fiber diet -patient comfortable with plan -try to wean off pain medication as tolerated    HPI 55 y.o.male presents for 3 month follow up of diverticulitis.  He reports that he went to the hospital after his visit with Dr. Oneta RackMcKeown and was diagnosed with uncomplicated diverticulitis.  He reports that he has been taking his antibiotics and has also been taking his antinausea medications.  He reports that he has been able to keep the medications down.  He was prescribed the oxycodone.  He reports that he was mildly upset that he got sent home.  He reports that he was expecting to stay there.  He has been having bowel movements and they have not been bloody.  He reports that he did have some dark colored urine in the ER but the culture was clean.  Prior to the diverticulitis he does get nauseated sometimes.  He reports that he does typically have a normal bowel movement 2-3 times daily.    Past Medical History:  Diagnosis Date  . Diabetic neuropathy ([name])   . History of hepatitis C   . History of kidney stones   . Hyperlipidemia   . Hypertension   . Other testicular hypofunction   . Type II or unspecified type diabetes mellitus without mention of complication, not stated as uncontrolled   . Vitamin D deficiency      Allergies  Allergen Reactions  . Morphine And Related   . Codeine Camsylate [Codeine] Rash      Current Outpatient Prescriptions on File Prior to Visit  Medication Sig Dispense Refill  . aspirin 81 MG tablet Take 81 mg by mouth daily.    . ciprofloxacin (CIPRO) 500 MG tablet Take 1 tablet (500 mg total) by mouth every 12 (twelve) hours. 20 tablet 0  . citalopram (CELEXA) 40 MG tablet TAKE ONE TABLET BY MOUTH ONCE DAILY FOR  MOOD 90 tablet 1  .  cyclobenzaprine (FLEXERIL) 10 MG tablet Take 1 tablet (10 mg total) by mouth 3 (three) times daily as needed for muscle spasms. 90 tablet 1  . hydrochlorothiazide (HYDRODIURIL) 25 MG tablet TAKE ONE TABLET BY MOUTH ONCE DAILY 90 tablet 1  . HYDROcodone-acetaminophen (NORCO/VICODIN) 5-325 MG per tablet Take 1 tablet by mouth every 4 (four) hours as needed for moderate pain. 60 tablet 0  . INVOKAMET [phone] MG TABS TAKE ONE TABLET BY MOUTH TWICE DAILY WITH FOOD 180 tablet 1  . lisinopril (PRINIVIL,ZESTRIL) 20 MG tablet Take 0.5 tablets (10 mg total) by mouth daily. (Patient taking differently: Take 20 mg by mouth daily. ) 90 tablet 1  . metroNIDAZOLE (FLAGYL) 500 MG tablet Take 1 tablet (500 mg total) by mouth 2 (two) times daily. 20 tablet 0  . ondansetron (ZOFRAN ODT) 4 MG disintegrating tablet Take 1 tablet (4 mg total) by mouth every 8 (eight) hours as needed for nausea. 10 tablet 0  . oxyCODONE-acetaminophen (PERCOCET/ROXICET) 5-325 MG tablet Take 1 tablet by mouth every 4 (four) hours as needed. 20 tablet 0   No current facility-administered medications on file prior to visit.     ROS: all negative except above.   Physical Exam: Filed Weights   11/24/15 1014  Weight: 275 lb (124.7 kg)   BP 128/70   Pulse 76  Temp 98.2 F (36.8 C) (Temporal)   Resp 18   Ht 6' 2.75" (1.899 m)   Wt 275 lb (124.7 kg)   BMI 34.60 kg/m  General Appearance: Well developed well nourished, non-toxic appearing in no apparent distress. Eyes: PERRLA, EOMs, conjunctiva w/ no swelling or erythema or discharge Sinuses: No Frontal/maxillary tenderness ENT/Mouth: Ear canals clear without swelling or erythema.  TM's normal bilaterally with no retractions, bulging, or loss of landmarks.   Neck: Supple, thyroid normal, no notable JVD  Respiratory: Respiratory effort normal, Clear breath sounds anteriorly and posteriorly bilaterally without rales, rhonchi, wheezing or stridor. No retractions or accessory muscle  usage. Cardio: RRR with no MRGs.   Abdomen: Soft, + BS.  Non tender, no guarding, rebound, hernias, masses.  Musculoskeletal: Full ROM, 5/5 strength, normal gait.  Skin: Warm, dry without rashes  Neuro: Awake and oriented X 3, Cranial nerves intact. Normal muscle tone, no cerebellar symptoms. Sensation intact.  Psych: normal affect, Insight and Judgment appropriate.     Terri Piedraourtney Forcucci, PA-C 10:45 AM [facility]

## 2015-11-24 NOTE — Patient Instructions (Signed)
Diverticulitis °Diverticulitis is inflammation or infection of small pouches in your colon that form when you have a condition called diverticulosis. The pouches in your colon are called diverticula. Your colon, or large intestine, is where water is absorbed and stool is formed. °Complications of diverticulitis can include: °· Bleeding. °· Severe infection. °· Severe pain. °· Perforation of your colon. °· Obstruction of your colon. °CAUSES  °Diverticulitis is caused by bacteria. °Diverticulitis happens when stool becomes trapped in diverticula. This allows bacteria to grow in the diverticula, which can lead to inflammation and infection. °RISK FACTORS °People with diverticulosis are at risk for diverticulitis. Eating a diet that does not include enough fiber from fruits and vegetables may make diverticulitis more likely to develop. °SYMPTOMS  °Symptoms of diverticulitis may include: °· Abdominal pain and tenderness. The pain is normally located on the left side of the abdomen, but may occur in other areas. °· Fever and chills. °· Bloating. °· Cramping. °· Nausea. °· Vomiting. °· Constipation. °· Diarrhea. °· Blood in your stool. °DIAGNOSIS  °Your health care provider will ask you about your medical history and do a physical exam. You may need to have tests done because many medical conditions can cause the same symptoms as diverticulitis. Tests may include: °· Blood tests. °· Urine tests. °· Imaging tests of the abdomen, including X-rays and CT scans. °When your condition is under control, your health care provider may recommend that you have a colonoscopy. A colonoscopy can show how severe your diverticula are and whether something else is causing your symptoms. °TREATMENT  °Most cases of diverticulitis are mild and can be treated at home. Treatment may include: °· Taking over-the-counter pain medicines. °· Following a clear liquid diet. °· Taking antibiotic medicines by mouth for 7-10 days. °More severe cases may  be treated at a hospital. Treatment may include: °· Not eating or drinking. °· Taking prescription pain medicine. °· Receiving antibiotic medicines through an IV tube. °· Receiving fluids and nutrition through an IV tube. °· Surgery. °HOME CARE INSTRUCTIONS  °· Follow your health care provider's instructions carefully. °· Follow a full liquid diet or other diet as directed by your health care provider. After your symptoms improve, your health care provider may tell you to change your diet. He or she may recommend you eat a high-fiber diet. Fruits and vegetables are good sources of fiber. Fiber makes it easier to pass stool. °· Take fiber supplements or probiotics as directed by your health care provider. °· Only take medicines as directed by your health care provider. °· Keep all your follow-up appointments. °SEEK MEDICAL CARE IF:  °· Your pain does not improve. °· You have a hard time eating food. °· Your bowel movements do not return to normal. °SEEK IMMEDIATE MEDICAL CARE IF:  °· Your pain becomes worse. °· Your symptoms do not get better. °· Your symptoms suddenly get worse. °· You have a fever. °· You have repeated vomiting. °· You have bloody or black, tarry stools. °MAKE SURE YOU:  °· Understand these instructions. °· Will watch your condition. °· Will get help right away if you are not doing well or get worse. °  °This information is not intended to replace advice given to you by your health care provider. Make sure you discuss any questions you have with your health care provider. °  °Document Released: 10/17/2004 Document Revised: 01/12/2013 Document Reviewed: 12/02/2012 °[employer] Interactive Patient Education ©2016 [employer] ° °

## 2015-12-04 ENCOUNTER — Encounter: Payer: Self-pay | Admitting: Internal Medicine

## 2015-12-04 ENCOUNTER — Ambulatory Visit (INDEPENDENT_AMBULATORY_CARE_PROVIDER_SITE_OTHER): Payer: 59 | Admitting: Internal Medicine

## 2015-12-04 VITALS — BP 120/80 | HR 63 | Temp 97.1°F | Resp 16 | Ht 75.0 in | Wt 277.0 lb

## 2015-12-04 DIAGNOSIS — Z0000 Encounter for general adult medical examination without abnormal findings: Secondary | ICD-10-CM

## 2015-12-04 DIAGNOSIS — E1140 Type 2 diabetes mellitus with diabetic neuropathy, unspecified: Secondary | ICD-10-CM

## 2015-12-04 DIAGNOSIS — R5383 Other fatigue: Secondary | ICD-10-CM

## 2015-12-04 DIAGNOSIS — Z136 Encounter for screening for cardiovascular disorders: Secondary | ICD-10-CM

## 2015-12-04 DIAGNOSIS — E1122 Type 2 diabetes mellitus with diabetic chronic kidney disease: Secondary | ICD-10-CM

## 2015-12-04 DIAGNOSIS — I10 Essential (primary) hypertension: Secondary | ICD-10-CM | POA: Diagnosis not present

## 2015-12-04 DIAGNOSIS — Z0001 Encounter for general adult medical examination with abnormal findings: Secondary | ICD-10-CM

## 2015-12-04 DIAGNOSIS — E1149 Type 2 diabetes mellitus with other diabetic neurological complication: Secondary | ICD-10-CM

## 2015-12-04 DIAGNOSIS — E349 Endocrine disorder, unspecified: Secondary | ICD-10-CM

## 2015-12-04 DIAGNOSIS — E782 Mixed hyperlipidemia: Secondary | ICD-10-CM

## 2015-12-04 DIAGNOSIS — E559 Vitamin D deficiency, unspecified: Secondary | ICD-10-CM

## 2015-12-04 DIAGNOSIS — N182 Chronic kidney disease, stage 2 (mild): Secondary | ICD-10-CM

## 2015-12-04 DIAGNOSIS — Z1212 Encounter for screening for malignant neoplasm of rectum: Secondary | ICD-10-CM

## 2015-12-04 DIAGNOSIS — Z125 Encounter for screening for malignant neoplasm of prostate: Secondary | ICD-10-CM

## 2015-12-04 DIAGNOSIS — Z79899 Other long term (current) drug therapy: Secondary | ICD-10-CM

## 2015-12-04 DIAGNOSIS — Z111 Encounter for screening for respiratory tuberculosis: Secondary | ICD-10-CM

## 2015-12-04 LAB — BASIC METABOLIC PANEL WITH GFR
BUN: 20 mg/dL (ref 7–25)
CALCIUM: 9.7 mg/dL (ref 8.6–10.3)
CO2: 26 mmol/L (ref 20–31)
CREATININE: 0.93 mg/dL (ref 0.70–1.33)
Chloride: 100 mmol/L (ref 98–110)
GFR, Est Non African American: >89 mL/min (ref >=60)
GLUCOSE: 137 mg/dL — AB (ref 65–99)
POTASSIUM: 4.5 mmol/L (ref 3.5–5.3)
Sodium: 136 mmol/L (ref 135–146)

## 2015-12-04 LAB — HEPATIC FUNCTION PANEL
ALBUMIN: 4.1 g/dL (ref 3.6–5.1)
ALK PHOS: 55 U/L (ref 40–115)
ALT: 49 U/L — ABNORMAL HIGH (ref 9–46)
AST: 31 U/L (ref 10–35)
Bilirubin, Direct: 0.1 mg/dL (ref <=0.2)
Indirect Bilirubin: 0.3 mg/dL (ref 0.2–1.2)
TOTAL PROTEIN: 7.1 g/dL (ref 6.1–8.1)
Total Bilirubin: 0.4 mg/dL (ref 0.2–1.2)

## 2015-12-04 LAB — HEMOGLOBIN A1C
Hgb A1c MFr Bld: 6.3 % — ABNORMAL HIGH (ref <5.7)
MEAN PLASMA GLUCOSE: 134 mg/dL

## 2015-12-04 LAB — PSA: PSA: 1 ng/mL (ref <=4.0)

## 2015-12-04 LAB — LIPID PANEL
Cholesterol: 170 mg/dL (ref <200)
HDL: 29 mg/dL — ABNORMAL LOW (ref >40)
LDL CALC: 86 mg/dL (ref <100)
TRIGLYCERIDES: 276 mg/dL — AB (ref <150)
Total CHOL/HDL Ratio: 5.9 Ratio — ABNORMAL HIGH (ref <5.0)
VLDL: 55 mg/dL — ABNORMAL HIGH (ref <30)

## 2015-12-04 LAB — CBC WITH DIFFERENTIAL/PLATELET
BASOS ABS: 0 {cells}/uL (ref 0–200)
Basophils Relative: 0 %
EOS ABS: 270 {cells}/uL (ref 15–500)
Eosinophils Relative: 3 %
HEMATOCRIT: 48.2 % (ref 38.5–50.0)
Hemoglobin: 16.7 g/dL (ref 13.2–17.1)
LYMPHS PCT: 36 %
Lymphs Abs: 3240 cells/uL (ref 850–3900)
MCH: 33.4 pg — AB (ref 27.0–33.0)
MCHC: 34.6 g/dL (ref 32.0–36.0)
MCV: 96.4 fL (ref 80.0–100.0)
MONO ABS: 810 {cells}/uL (ref 200–950)
MONOS PCT: 9 %
MPV: 12.1 fL (ref 7.5–12.5)
NEUTROS PCT: 52 %
Neutro Abs: 4680 cells/uL (ref 1500–7800)
PLATELETS: 223 10*3/uL (ref 140–400)
RBC: 5 MIL/uL (ref 4.20–5.80)
RDW: 13.7 % (ref 11.0–15.0)
WBC: 9 10*3/uL (ref 3.8–10.8)

## 2015-12-04 LAB — IRON AND TIBC
%SAT: 31 % (ref 15–60)
IRON: 85 ug/dL (ref 50–180)
TIBC: 270 ug/dL (ref 250–425)
UIBC: 185 ug/dL (ref 125–400)

## 2015-12-04 LAB — VITAMIN B12: VITAMIN B 12: 569 pg/mL (ref 200–1100)

## 2015-12-04 LAB — TSH: TSH: 1.29 m[IU]/L (ref 0.40–4.50)

## 2015-12-04 LAB — MAGNESIUM: MAGNESIUM: 2.2 mg/dL (ref 1.5–2.5)

## 2015-12-04 NOTE — Patient Instructions (Signed)

## 2015-12-04 NOTE — Progress Notes (Signed)
[facility]   [name], M.D.    Dyanne Carrel. Steffanie Dunn, P.A.-C      Terri Piedra, P.A.-C  [facility]                [address]                [place], [place]. [address] Telephone [phone] Telefax [phone] Annual  Screening/Preventative Visit  & Comprehensive Evaluation & Examination     This very nice 55 y.o.  MWM presents for a Screening/Preventative Visit & comprehensive evaluation and management of multiple medical co-morbidities.  Patient has been followed for HTN, T2_NIDDM  , Hyperlipidemia and Vitamin D Deficiency.     HTN predates circa 1990. Patient's BP has been controlled at home.Today's BP is  120/80. Patient denies any cardiac symptoms as chest pain, palpitations, shortness of breath, dizziness or ankle swelling.     Patient's hyperlipidemia is controlled with diet and medications. Patient denies myalgias or other medication SE's. 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 Last A1c was not at goal: Lab Results  Component Value Date   HGBA1C 6.5 (H) 08/17/2015       Finally, patient has history of Vitamin D Deficiency in 2008 of "24"  and last vitamin D was at goal: Lab Results  Component Value Date   VD25OH 61 08/17/2015   Current Outpatient Prescriptions on File Prior to Visit  Medication Sig  . citalopram (CELEXA) 40 MG tablet TAKE ONE TABLET BY MOUTH ONCE DAILY FOR  MOOD   Vit D  OFF x 6 months !  . aspirin 81 MG tablet Take 81 mg by mouth daily.  Marland Kitchen hCTZ 25 MG tablet TAKE ONE TAB ONCE DAILY   . INVOKAMET [phone] MG TABS TAKE ONE TAB TWICE DAILY WITH FOOD  . lisinopril  20 MG tablet Take 20 mg by mouth daily. )   Allergies  Allergen Reactions  . Morphine And Related   . Codeine Camsylate [Codeine] Rash   Past Medical History:  Diagnosis Date  . Diabetic neuropathy ([name])   . History of hepatitis C   . History of kidney stones   . Hyperlipidemia   . Hypertension   . Other testicular hypofunction   . Type II or unspecified type diabetes mellitus without mention of complication, not stated as uncontrolled   . Vitamin D deficiency    Health Maintenance  Topic Date Due  . PNEUMOCOCCAL POLYSACCHARIDE VACCINE (1) 11/05/1960  . OPHTHALMOLOGY EXAM  11/05/1968  . COLONOSCOPY  11/05/2008  . TETANUS/TDAP  01/22/2015  . INFLUENZA VACCINE  08/22/2015  . FOOT EXAM  11/08/2015  . HEMOGLOBIN A1C  02/17/2016  . Hepatitis C Screening  Completed  . HIV Screening  Completed   Immunization History  Administered Date(s) Administered  . PPD Test 08/31/2013, 11/08/2014  . Td 01/21/2005   Past Surgical History:  Procedure Laterality Date  . CHOLECYSTECTOMY  1987  . ORIF TIBIA FRACTURE     Family History  Problem Relation Age of Onset  .  Hypertension Mother   . Cancer Father     colon  . Alzheimer's disease Father   . Stroke Father    Social History   Social History  . Marital status: Married    Spouse name: N/A  . Number of children: N/A  . Years of education: N/A   Social History Main Topics  . Smoking status: Former Smoker    Packs/day: 0.75    Types: Cigarettes    Quit date: 11/10/2015  . Smokeless tobacco: Never Used  . Alcohol use No  . Drug use:     Types: Marijuana  . Sexual activity: Not on file    ROS Constitutional: Denies fever, chills, weight loss/gain, headaches, insomnia,  night sweats or change in appetite. Does c/o fatigue. Eyes: Denies redness, blurred vision, diplopia, discharge, itchy or watery eyes.  ENT: Denies discharge, congestion, post  nasal drip, epistaxis, sore throat, earache, hearing loss, dental pain, Tinnitus, Vertigo, Sinus pain or snoring.  Cardio: Denies chest pain, palpitations, irregular heartbeat, syncope, dyspnea, diaphoresis, orthopnea, PND, claudication or edema Respiratory: denies cough, dyspnea, DOE, pleurisy, hoarseness, laryngitis or wheezing.  Gastrointestinal: Denies dysphagia, heartburn, reflux, water brash, pain, cramps, nausea, vomiting, bloating, diarrhea, constipation, hematemesis, melena, hematochezia, jaundice or hemorrhoids Genitourinary: Denies dysuria, frequency, urgency, nocturia, hesitancy, discharge, hematuria or flank pain Musculoskeletal: Denies arthralgia, myalgia, stiffness, Jt. Swelling, pain, limp or strain/sprain. Denies Falls. Skin: Denies puritis, rash, hives, warts, acne, eczema or change in skin lesion Neuro: No weakness, tremor, incoordination, spasms, paresthesia or pain Psychiatric: Denies confusion, memory loss or sensory loss. Denies Depression. Endocrine: Denies change in weight, skin, hair change, nocturia, and paresthesia, diabetic polys, visual blurring or hyper / hypo glycemic episodes.  Heme/Lymph: No excessive bleeding, bruising or enlarged lymph nodes.  Physical Exam  BP 120/80   Pulse 63   Temp 97.1 F (36.2 C)   Resp 16   Ht 6\' 3"  (1.905 m)   Wt 277 lb (125.6 kg)   SpO2 98%   BMI 34.62 kg/m   General Appearance: Over nourished with central obesity, in no apparent distress.  Eyes: PERRLA, EOMs, conjunctiva no swelling or erythema, normal fundi and vessels. Sinuses: No frontal/maxillary tenderness ENT/Mouth: EACs patent / TMs  nl. Nares clear without erythema, swelling, mucoid exudates. Oral hygiene is good. No erythema, swelling, or exudate. Tongue normal, non-obstructing. Tonsils not swollen or erythematous. Hearing normal.  Neck: Supple, thyroid normal. No bruits, nodes or JVD. Respiratory: Respiratory effort normal.  BS equal and clear bilateral without  rales, rhonci, wheezing or stridor. Cardio: Heart sounds are normal with regular rate and rhythm and no murmurs, rubs or gallops. Peripheral pulses are normal and equal bilaterally without edema. No aortic or femoral bruits. Chest: symmetric with normal excursions and percussion.  Abdomen: Soft, with Nl bowel sounds. Nontender, no guarding, rebound, hernias, masses, or organomegaly.  Lymphatics: Non tender without lymphadenopathy.  Genitourinary: No hernias.Testes nl. DRE - prostate nl for age - smooth & firm w/o nodules. Musculoskeletal: Full ROM all peripheral extremities, joint stability, 5/5 strength, and normal gait. Skin: Warm and dry without rashes, lesions, cyanosis, clubbing or  ecchymosis.  Neuro: Cranial nerves intact, reflexes equal bilaterally. Normal muscle tone, no cerebellar symptoms. ensation intact to  touch and sl decreased to Vibratory and monofilament in a stocking distribution at the dorsal feet.  Pysch: Alert and oriented X 3 with normal affect, insight and judgment appropriate.   Assessment and Plan  1. Annual Preventative/Screening Exam   - Microalbumin / creatinine urine  ratio - EKG 12-Lead - US, RETROPERITNL ABD,  LTD - POC Hemoccult Bld/Stl  - Urinalysis, Routine w reflex microscopic  - Vitamin B12 - Iron and TIBC - PSA - Testosterone - HM DIABETES FOOT EXAM - LOW EXTREMITY NEUR EXAM DOCUM - CBC with Differential/Platelet - BASIC METABOLIC PANEL WITH GFR - Hepatic function panel - Magnesium - Lipid panel - TSH - Hemoglobin A1c - Insulin, random - VITAMIN D 25 Hydroxy  2. Essential hypertension  - Microalbumin / creatinine urine ratio - EKG 12-Lead - US, RETROPERITNL ABD,  LTD - TSH  3. Mixed hyperlipidemia  - EKG 12-Lead - US, RETROPERITNL ABD,  LTD - Lipid panel - TSH  4. T2_NIDDM w/ CKD  - Microalbumin / creatinine urine ratio - EKG 12-Lead - US, RETROPERITNL ABD,  LTD - HM DIABETES FOOT EXAM - LOW EXTREMITY NEUR EXAM DOCUM -  Hemoglobin A1c - Insulin, random  5. Vitamin D deficiency  - VITAMIN D 25 Hydroxy   6. T2_NIDDM w/Peripheral Neuropathy   7. Screening for rectal cancer  - POC Hemoccult Bld/Stl   8. Prostate cancer screening  - PSA  9. Screening examination for pulmonary tuberculosis   10. Testosterone deficiency  - Testosterone  11. Screening for ischemic heart disease  - EKG 12-Lead  12. Screening for AAA (aortic abdominal aneurysm)  - US, RETROPERITNL ABD,  LTD  13. Other fatigue  - Vitamin B12 - Iron and TIBC - Testosterone - CBC with Differential/Platelet - TSH  14. Medication management  - Urinalysis, Routine w reflex microscopic  - Vitamin B12 - CBC with Differential/Platelet - BASIC METABOLIC PANEL WITH GFR - Hepatic function panel - Magnesium      Continue prudent diet as discussed, weight control, BP monitoring, regular exercise, and medications as discussed.  Discussed med effects and SE's. Routine screening labs and tests as requested with regular follow-up as recommended. Over 40 minutes of exam, counseling, chart review and high complex critical decision making was performed

## 2015-12-05 LAB — URINALYSIS, ROUTINE W REFLEX MICROSCOPIC
Bilirubin Urine: NEGATIVE
Hgb urine dipstick: NEGATIVE
KETONES UR: NEGATIVE
Leukocytes, UA: NEGATIVE
NITRITE: NEGATIVE
PH: 5 (ref 5.0–8.0)
Protein, ur: NEGATIVE
SPECIFIC GRAVITY, URINE: 1.03 (ref 1.001–1.035)

## 2015-12-05 LAB — MICROALBUMIN / CREATININE URINE RATIO
CREATININE, URINE: 120 mg/dL (ref 20–370)
Microalb Creat Ratio: 5 mcg/mg creat (ref <30)
Microalb, Ur: 0.6 mg/dL

## 2015-12-05 LAB — URINALYSIS, MICROSCOPIC ONLY
Bacteria, UA: NONE SEEN [HPF]
Casts: NONE SEEN [LPF]
Squamous Epithelial / LPF: NONE SEEN [HPF] (ref <=5)
WBC UA: NONE SEEN WBC/HPF (ref <=5)
Yeast: NONE SEEN [HPF]

## 2015-12-05 LAB — TESTOSTERONE: Testosterone: 120 ng/dL — ABNORMAL LOW (ref 250–827)

## 2015-12-05 LAB — VITAMIN D 25 HYDROXY (VIT D DEFICIENCY, FRACTURES): VIT D 25 HYDROXY: 44 ng/mL (ref 30–100)

## 2015-12-05 LAB — INSULIN, RANDOM: INSULIN: 51 u[IU]/mL — AB (ref 2.0–19.6)

## 2015-12-12 ENCOUNTER — Other Ambulatory Visit: Payer: Self-pay | Admitting: Internal Medicine

## 2016-03-12 ENCOUNTER — Ambulatory Visit: Payer: Self-pay | Admitting: Internal Medicine

## 2016-03-13 ENCOUNTER — Other Ambulatory Visit: Payer: Self-pay | Admitting: Internal Medicine

## 2016-03-13 ENCOUNTER — Other Ambulatory Visit: Payer: Self-pay | Admitting: Physician Assistant

## 2016-03-13 DIAGNOSIS — I10 Essential (primary) hypertension: Secondary | ICD-10-CM

## 2016-03-20 ENCOUNTER — Encounter: Payer: Self-pay | Admitting: Internal Medicine

## 2016-03-20 ENCOUNTER — Ambulatory Visit (INDEPENDENT_AMBULATORY_CARE_PROVIDER_SITE_OTHER): Payer: 59 | Admitting: Internal Medicine

## 2016-03-20 VITALS — BP 110/60 | HR 74 | Temp 98.4°F | Resp 18 | Ht 75.0 in | Wt 272.0 lb

## 2016-03-20 DIAGNOSIS — E782 Mixed hyperlipidemia: Secondary | ICD-10-CM | POA: Diagnosis not present

## 2016-03-20 DIAGNOSIS — E1122 Type 2 diabetes mellitus with diabetic chronic kidney disease: Secondary | ICD-10-CM | POA: Diagnosis not present

## 2016-03-20 DIAGNOSIS — E1149 Type 2 diabetes mellitus with other diabetic neurological complication: Secondary | ICD-10-CM

## 2016-03-20 DIAGNOSIS — I739 Peripheral vascular disease, unspecified: Secondary | ICD-10-CM

## 2016-03-20 DIAGNOSIS — Z79899 Other long term (current) drug therapy: Secondary | ICD-10-CM

## 2016-03-20 DIAGNOSIS — E559 Vitamin D deficiency, unspecified: Secondary | ICD-10-CM | POA: Diagnosis not present

## 2016-03-20 DIAGNOSIS — N182 Chronic kidney disease, stage 2 (mild): Secondary | ICD-10-CM | POA: Diagnosis not present

## 2016-03-20 DIAGNOSIS — I10 Essential (primary) hypertension: Secondary | ICD-10-CM

## 2016-03-20 DIAGNOSIS — E1140 Type 2 diabetes mellitus with diabetic neuropathy, unspecified: Secondary | ICD-10-CM

## 2016-03-20 DIAGNOSIS — J449 Chronic obstructive pulmonary disease, unspecified: Secondary | ICD-10-CM | POA: Diagnosis not present

## 2016-03-20 LAB — CBC WITH DIFFERENTIAL/PLATELET
Basophils Absolute: 101 cells/uL (ref 0–200)
Basophils Relative: 1 %
EOS PCT: 3 %
Eosinophils Absolute: 303 cells/uL (ref 15–500)
HEMATOCRIT: 46.4 % (ref 38.5–50.0)
HEMOGLOBIN: 16.2 g/dL (ref 13.2–17.1)
LYMPHS ABS: 3939 {cells}/uL — AB (ref 850–3900)
Lymphocytes Relative: 39 %
MCH: 33.2 pg — ABNORMAL HIGH (ref 27.0–33.0)
MCHC: 34.9 g/dL (ref 32.0–36.0)
MCV: 95.1 fL (ref 80.0–100.0)
MONO ABS: 1111 {cells}/uL — AB (ref 200–950)
MPV: 11.7 fL (ref 7.5–12.5)
Monocytes Relative: 11 %
NEUTROS ABS: 4646 {cells}/uL (ref 1500–7800)
Neutrophils Relative %: 46 %
Platelets: 205 10*3/uL (ref 140–400)
RBC: 4.88 MIL/uL (ref 4.20–5.80)
RDW: 13.8 % (ref 11.0–15.0)
WBC: 10.1 10*3/uL (ref 3.8–10.8)

## 2016-03-20 LAB — HEMOGLOBIN A1C
Hgb A1c MFr Bld: 5.9 % — ABNORMAL HIGH (ref <5.7)
MEAN PLASMA GLUCOSE: 123 mg/dL

## 2016-03-20 LAB — TSH: TSH: 1.44 m[IU]/L (ref 0.40–4.50)

## 2016-03-20 MED ORDER — AMITRIPTYLINE HCL 25 MG PO TABS: 25.0000 mg | ORAL_TABLET | Freq: Three times a day (TID) | ORAL | 0 refills | Status: DC

## 2016-03-20 NOTE — Progress Notes (Signed)
Assessment and Plan:  Hypertension:  -Continue medication,  -monitor blood pressure at home.  -Continue DASH diet.   -Reminder to go to the ER if any CP, SOB, nausea, dizziness, severe HA, changes vision/speech, left arm numbness and tingling, and jaw pain.  Cholesterol: -Continue diet and exercise.  -Check cholesterol.   Diabetes: -has been controlled with invokamet -Continue diet and exercise.  -Check A1C  Vitamin D Def: -check level -continue medications.   PAD -ABI -referral to vascular -elavil for neuropathy  Continue diet and meds as discussed. Further disposition pending results of labs.  HPI 56 y.o. male  presents for 3 month follow up with hypertension, hyperlipidemia, prediabetes and vitamin D.   His blood pressure has been controlled at home, today their BP is BP: 110/60.   He does not workout. He denies chest pain, shortness of breath, dizziness.   He is on cholesterol medication and denies myalgias. His cholesterol is at goal. The cholesterol last visit was:   Lab Results  Component Value Date   CHOL 170 12/04/2015   HDL 29 (L) 12/04/2015   LDLCALC 86 12/04/2015   TRIG 276 (H) 12/04/2015   CHOLHDL 5.9 (H) 12/04/2015     He has been working on diet and exercise for prediabetes, and denies foot ulcerations, hyperglycemia, hypoglycemia , increased appetite, nausea, polydipsia, polyuria, visual disturbances, vomiting and weight loss. Last A1C in the office was:  Lab Results  Component Value Date   HGBA1C 6.3 (H) 12/04/2015  His blood sugars are doing okay.   He is running around 119-125.     Patient is on Vitamin D supplement.  Lab Results  Component Value Date   VD25OH 7544 12/04/2015     He reports that he has been having some issues with his bilateral feet.  He reports that by the end of the day his feet are really aching and giving him some trouble.  He has a lot more of the burning, numbness and tingling.  He finds that they are also red.  He reports  that his right leg generally gives him more trouble than his left leg.       Current Medications:  Current Outpatient Prescriptions on File Prior to Visit  Medication Sig Dispense Refill  . aspirin 81 MG tablet Take 81 mg by mouth daily.    . citalopram (CELEXA) 40 MG tablet TAKE ONE TABLET BY MOUTH ONCE DAILY FOR  MOOD 90 tablet 1  . hydrochlorothiazide (HYDRODIURIL) 25 MG tablet TAKE ONE TABLET BY MOUTH ONCE DAILY 90 tablet 1  . INVOKAMET [phone] MG TABS TAKE ONE TABLET BY MOUTH TWICE DAILY WITH FOOD 180 tablet 1  . lisinopril (PRINIVIL,ZESTRIL) 20 MG tablet TAKE ONE TABLET BY MOUTH ONCE DAILY 90 tablet 1   No current facility-administered medications on file prior to visit.     Medical History:  Past Medical History:  Diagnosis Date  . Diabetic neuropathy ([name])   . History of hepatitis C   . History of kidney stones   . Hyperlipidemia   . Hypertension   . Other testicular hypofunction   . Type II or unspecified type diabetes mellitus without mention of complication, not stated as uncontrolled   . Vitamin D deficiency     Allergies:  Allergies  Allergen Reactions  . Morphine And Related   . Codeine Camsylate [Codeine] Rash     Review of Systems:  Review of Systems  Constitutional: Negative for chills, fever and malaise/fatigue.  HENT: Negative for congestion,  ear pain and sore throat.   Eyes: Negative.   Respiratory: Negative for cough, shortness of breath and wheezing.   Cardiovascular: Negative for chest pain, palpitations and leg swelling.  Gastrointestinal: Negative for abdominal pain, blood in stool, constipation, diarrhea, heartburn and melena.  Genitourinary: Negative.   Skin: Negative.   Neurological: Negative for dizziness, sensory change, loss of consciousness and headaches.  Psychiatric/Behavioral: Negative for depression. The patient is not nervous/anxious and does not have insomnia.     Family history- Review and unchanged  Social history- Review and  unchanged  Physical Exam: BP 110/60   Pulse 74   Temp 98.4 F (36.9 C) (Temporal)   Resp 18   Ht 6\' 3"  (1.905 m)   Wt 272 lb (123.4 kg)   BMI 34.00 kg/m  Wt Readings from Last 3 Encounters:  03/20/16 272 lb (123.4 kg)  12/04/15 277 lb (125.6 kg)  11/24/15 275 lb (124.7 kg)    General Appearance: Well nourished well developed, in no apparent distress. Eyes: PERRLA, EOMs, conjunctiva no swelling or erythema ENT/Mouth: Ear canals normal without obstruction, swelling, erythma, discharge.  TMs normal bilaterally.  Oropharynx moist, clear, without exudate, or postoropharyngeal swelling. Neck: Supple, thyroid normal,no cervical adenopathy  Respiratory: Respiratory effort normal, Breath sounds clear A&P without rhonchi, wheeze, or rale.  No retractions, no accessory usage. Cardio: RRR with no MRGs. Brisk peripheral pulses without edema.  Left pedal pulses difficult to find.  Right pedal pulses 1+ DP and PT  Abdomen: Soft, + BS,  Non tender, no guarding, rebound, hernias, masses. Musculoskeletal: Full ROM, 5/5 strength, Normal gait Skin: Bilateral feet with rubor on dependency.  Skin appears waxy, no ulcers, Warm, dry without rashes, lesions, ecchymosis.  Neuro: Awake and oriented X 3, Cranial nerves intact. Normal muscle tone, no cerebellar symptoms. Psych: Normal affect, Insight and Judgment appropriate.    Terri Piedra, PA-C 4:34 PM [facility]

## 2016-03-20 NOTE — Patient Instructions (Signed)
Amitriptyline tablets What is this medicine? AMITRIPTYLINE (a mee TRIP ti leen) is used to treat depression. This medicine may be used for other purposes; ask your health care provider or pharmacist if you have questions. COMMON BRAND NAME(S): Elavil, Vanatrip What should I tell my health care provider before I take this medicine? They need to know if you have any of these conditions: -an alcohol problem -asthma, difficulty breathing -bipolar disorder or schizophrenia -difficulty passing urine, prostate trouble -glaucoma -heart disease or previous heart attack -liver disease -over active thyroid -seizures -thoughts or plans of suicide, a previous suicide attempt, or family history of suicide attempt -an unusual or allergic reaction to amitriptyline, other medicines, foods, dyes, or preservatives -pregnant or trying to get pregnant -breast-feeding How should I use this medicine? Take this medicine by mouth with a drink of water. Follow the directions on the prescription label. You can take the tablets with or without food. Take your medicine at regular intervals. Do not take it more often than directed. Do not stop taking this medicine suddenly except upon the advice of your doctor. Stopping this medicine too quickly may cause serious side effects or your condition may worsen. A special MedGuide will be given to you by the pharmacist with each prescription and refill. Be sure to read this information carefully each time. Talk to your pediatrician regarding the use of this medicine in children. Special care may be needed. Overdosage: If you think you have taken too much of this medicine contact a poison control center or emergency room at once. NOTE: This medicine is only for you. Do not share this medicine with others. What if I miss a dose? If you miss a dose, take it as soon as you can. If it is almost time for your next dose, take only that dose. Do not take double or extra doses. What  may interact with this medicine? Do not take this medicine with any of the following medications: -arsenic trioxide -certain medicines used to regulate abnormal heartbeat or to treat other heart conditions -cisapride -droperidol -halofantrine -linezolid -MAOIs like Carbex, Eldepryl, Marplan, Nardil, and Parnate -methylene blue -other medicines for mental depression -phenothiazines like perphenazine, thioridazine and chlorpromazine -pimozide -probucol -procarbazine -sparfloxacin -St. John's Wort -ziprasidone This medicine may also interact with the following medications: -atropine and related drugs like hyoscyamine, scopolamine, tolterodine and others -barbiturate medicines for inducing sleep or treating seizures, like phenobarbital -cimetidine -disulfiram -ethchlorvynol -thyroid hormones such as levothyroxine This list may not describe all possible interactions. Give your health care provider a list of all the medicines, herbs, non-prescription drugs, or dietary supplements you use. Also tell them if you smoke, drink alcohol, or use illegal drugs. Some items may interact with your medicine. What should I watch for while using this medicine? Tell your doctor if your symptoms do not get better or if they get worse. Visit your doctor or health care professional for regular checks on your progress. Because it may take several weeks to see the full effects of this medicine, it is important to continue your treatment as prescribed by your doctor. Patients and their families should watch out for new or worsening thoughts of suicide or depression. Also watch out for sudden changes in feelings such as feeling anxious, agitated, panicky, irritable, hostile, aggressive, impulsive, severely restless, overly excited and hyperactive, or not being able to sleep. If this happens, especially at the beginning of treatment or after a change in dose, call your health care professional. You may get   drowsy or  dizzy. Do not drive, use machinery, or do anything that needs mental alertness until you know how this medicine affects you. Do not stand or sit up quickly, especially if you are an older patient. This reduces the risk of dizzy or fainting spells. Alcohol may interfere with the effect of this medicine. Avoid alcoholic drinks. Do not treat yourself for coughs, colds, or allergies without asking your doctor or health care professional for advice. Some ingredients can increase possible side effects. Your mouth may get dry. Chewing sugarless gum or sucking hard candy, and drinking plenty of water will help. Contact your doctor if the problem does not go away or is severe. This medicine may cause dry eyes and blurred vision. If you wear contact lenses you may feel some discomfort. Lubricating drops may help. See your eye doctor if the problem does not go away or is severe. This medicine can cause constipation. Try to have a bowel movement at least every 2 to 3 days. If you do not have a bowel movement for 3 days, call your doctor or health care professional. This medicine can make you more sensitive to the sun. Keep out of the sun. If you cannot avoid being in the sun, wear protective clothing and use sunscreen. Do not use sun lamps or tanning beds/booths. What side effects may I notice from receiving this medicine? Side effects that you should report to your doctor or health care professional as soon as possible: -allergic reactions like skin rash, itching or hives, swelling of the face, lips, or tongue -anxious -breathing problems -changes in vision -confusion -elevated mood, decreased need for sleep, racing thoughts, impulsive behavior -eye pain -fast, irregular heartbeat -feeling faint or lightheaded, falls -feeling agitated, angry, or irritable -fever with increased sweating -hallucination, loss of contact with reality -seizures -stiff muscles -suicidal thoughts or other mood  changes -tingling, pain, or numbness in the feet or hands -trouble passing urine or change in the amount of urine -trouble sleeping -unusually weak or tired -vomiting -yellowing of the eyes or skin Side effects that usually do not require medical attention (report to your doctor or health care professional if they continue or are bothersome): -change in sex drive or performance -change in appetite or weight -constipation -dizziness -dry mouth -nausea -tired -tremors -upset stomach This list may not describe all possible side effects. Call your doctor for medical advice about side effects. You may report side effects to FDA at 1-800-FDA-1088. Where should I keep my medicine? Keep out of the reach of children. Store at room temperature between 20 and 25 degrees C (68 and 77 degrees F). Throw away any unused medicine after the expiration date. NOTE: This sheet is a summary. It may not cover all possible information. If you have questions about this medicine, talk to your doctor, pharmacist, or health care provider.  2018 Elsevier/Gold Standard (2015-06-09 12:14:15)    Peripheral Vascular Disease Peripheral vascular disease (PVD) is a disease of the blood vessels that are not part of your heart and brain. A simple term for PVD is poor circulation. In most cases, PVD narrows the blood vessels that carry blood from your heart to the rest of your body. This can result in a decreased supply of blood to your arms, legs, and internal organs, like your stomach or kidneys. However, it most often affects a person's lower legs and feet. There are two types of PVD.  Organic PVD. This is the more common type. It is caused by  damage to the structure of blood vessels.  Functional PVD. This is caused by conditions that make blood vessels contract and tighten (spasm). Without treatment, PVD tends to get worse over time. PVD can also lead to acute ischemic limb. This is when an arm or limb suddenly has  trouble getting enough blood. This is a medical emergency. Follow these instructions at home:  Take medicines only as told by your doctor.  Do not use any tobacco products, including cigarettes, chewing tobacco, or electronic cigarettes. If you need help quitting, ask your doctor.  Lose weight if you are overweight, and maintain a healthy weight as told by your doctor.  Eat a diet that is low in fat and cholesterol. If you need help, ask your doctor.  Exercise regularly. Ask your doctor for some good activities for you.  Take good care of your feet.  Wear comfortable shoes that fit well.  Check your feet often for any cuts or sores. Contact a doctor if:  You have cramps in your legs while walking.  You have leg pain when you are at rest.  You have coldness in a leg or foot.  Your skin changes.  You are unable to get or have an erection (erectile dysfunction).  You have cuts or sores on your feet that are not healing. Get help right away if:  Your arm or leg turns cold and blue.  Your arms or legs become red, warm, swollen, painful, or numb.  You have chest pain or trouble breathing.  You suddenly have weakness in your face, arm, or leg.  You become very confused or you cannot speak.  You suddenly have a very bad headache.  You suddenly cannot see. This information is not intended to replace advice given to you by your health care provider. Make sure you discuss any questions you have with your health care provider. Document Released: 04/03/2009 Document Revised: 06/15/2015 Document Reviewed: 06/17/2013 Elsevier Interactive Patient Education  2017 [employer].

## 2016-03-21 LAB — HEPATIC FUNCTION PANEL
ALBUMIN: 3.9 g/dL (ref 3.6–5.1)
ALT: 45 U/L (ref 9–46)
AST: 31 U/L (ref 10–35)
Alkaline Phosphatase: 54 U/L (ref 40–115)
Bilirubin, Direct: 0.1 mg/dL (ref <=0.2)
Indirect Bilirubin: 0.3 mg/dL (ref 0.2–1.2)
Total Bilirubin: 0.4 mg/dL (ref 0.2–1.2)
Total Protein: 6.8 g/dL (ref 6.1–8.1)

## 2016-03-21 LAB — BASIC METABOLIC PANEL WITH GFR
BUN: 24 mg/dL (ref 7–25)
CHLORIDE: 102 mmol/L (ref 98–110)
CO2: 18 mmol/L — AB (ref 20–31)
Calcium: 9.5 mg/dL (ref 8.6–10.3)
Creat: 0.88 mg/dL (ref 0.70–1.33)
GFR, Est African American: >89 mL/min (ref >=60)
Glucose, Bld: 107 mg/dL — ABNORMAL HIGH (ref 65–99)
POTASSIUM: 4.3 mmol/L (ref 3.5–5.3)
SODIUM: 135 mmol/L (ref 135–146)

## 2016-03-21 LAB — LIPID PANEL
CHOLESTEROL: 182 mg/dL (ref <200)
HDL: 23 mg/dL — ABNORMAL LOW (ref >40)
LDL Cholesterol: 87 mg/dL (ref <100)
Total CHOL/HDL Ratio: 7.9 Ratio — ABNORMAL HIGH (ref <5.0)
Triglycerides: 362 mg/dL — ABNORMAL HIGH (ref <150)
VLDL: 72 mg/dL — ABNORMAL HIGH (ref <30)

## 2016-03-22 ENCOUNTER — Ambulatory Visit (HOSPITAL_COMMUNITY)
Admission: RE | Admit: 2016-03-22 | Discharge: 2016-03-22 | Disposition: A | Discharge status: Home / Self Care | Payer: 59 | Source: Ambulatory Visit | Attending: Vascular Surgery | Admitting: Vascular Surgery

## 2016-03-22 DIAGNOSIS — E1151 Type 2 diabetes mellitus with diabetic peripheral angiopathy without gangrene: Secondary | ICD-10-CM | POA: Diagnosis not present

## 2016-03-22 DIAGNOSIS — Z720 Tobacco use: Secondary | ICD-10-CM | POA: Insufficient documentation

## 2016-03-22 DIAGNOSIS — I739 Peripheral vascular disease, unspecified: Secondary | ICD-10-CM | POA: Diagnosis not present

## 2016-03-22 DIAGNOSIS — I10 Essential (primary) hypertension: Secondary | ICD-10-CM | POA: Insufficient documentation

## 2016-03-25 ENCOUNTER — Encounter (HOSPITAL_COMMUNITY): Payer: 59

## 2016-04-01 ENCOUNTER — Encounter: Payer: Self-pay | Admitting: Surgery

## 2016-04-11 ENCOUNTER — Encounter (HOSPITAL_COMMUNITY): Payer: 59

## 2016-04-15 ENCOUNTER — Encounter: Payer: Self-pay | Admitting: Surgery

## 2016-04-15 ENCOUNTER — Ambulatory Visit (INDEPENDENT_AMBULATORY_CARE_PROVIDER_SITE_OTHER): Payer: 59 | Admitting: Surgery

## 2016-04-15 VITALS — BP 131/78 | HR 82 | Temp 98.1°F | Resp 21 | Ht 75.0 in | Wt 272.9 lb

## 2016-04-15 DIAGNOSIS — I70213 Atherosclerosis of native arteries of extremities with intermittent claudication, bilateral legs: Secondary | ICD-10-CM
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
## 2016-06-11 ENCOUNTER — Encounter: Payer: Self-pay | Admitting: Internal Medicine

## 2016-06-11 ENCOUNTER — Ambulatory Visit (INDEPENDENT_AMBULATORY_CARE_PROVIDER_SITE_OTHER): Payer: 59 | Admitting: Internal Medicine

## 2016-06-11 VITALS — BP 136/80 | HR 72 | Temp 97.6°F | Resp 16 | Ht 75.0 in | Wt 277.6 lb

## 2016-06-11 DIAGNOSIS — E559 Vitamin D deficiency, unspecified: Secondary | ICD-10-CM | POA: Diagnosis not present

## 2016-06-11 DIAGNOSIS — E782 Mixed hyperlipidemia: Secondary | ICD-10-CM

## 2016-06-11 DIAGNOSIS — E1140 Type 2 diabetes mellitus with diabetic neuropathy, unspecified: Secondary | ICD-10-CM | POA: Diagnosis not present

## 2016-06-11 DIAGNOSIS — Z79899 Other long term (current) drug therapy: Secondary | ICD-10-CM | POA: Diagnosis not present

## 2016-06-11 DIAGNOSIS — I10 Essential (primary) hypertension: Secondary | ICD-10-CM

## 2016-06-11 DIAGNOSIS — E1149 Type 2 diabetes mellitus with other diabetic neurological complication: Secondary | ICD-10-CM

## 2016-06-11 LAB — CBC WITH DIFFERENTIAL/PLATELET
BASOS PCT: 1 %
Basophils Absolute: 85 cells/uL (ref 0–200)
EOS PCT: 2 %
Eosinophils Absolute: 170 cells/uL (ref 15–500)
HCT: 47.6 % (ref 38.5–50.0)
Hemoglobin: 16 g/dL (ref 13.2–17.1)
LYMPHS PCT: 44 %
Lymphs Abs: 3740 cells/uL (ref 850–3900)
MCH: 32.5 pg (ref 27.0–33.0)
MCHC: 33.6 g/dL (ref 32.0–36.0)
MCV: 96.6 fL (ref 80.0–100.0)
MONOS PCT: 10 %
MPV: 11.3 fL (ref 7.5–12.5)
Monocytes Absolute: 850 cells/uL (ref 200–950)
Neutro Abs: 3655 cells/uL (ref 1500–7800)
Neutrophils Relative %: 43 %
PLATELETS: 174 10*3/uL (ref 140–400)
RBC: 4.93 MIL/uL (ref 4.20–5.80)
RDW: 14.1 % (ref 11.0–15.0)
WBC: 8.5 10*3/uL (ref 3.8–10.8)

## 2016-06-11 NOTE — Patient Instructions (Signed)

## 2016-06-11 NOTE — Progress Notes (Signed)
This very nice 56 y.o. MWM presents for 6 month follow up with Hypertension, Hyperlipidemia, T2_NIDDM and Vitamin D Deficiency.      Patient is treated for HTN (1990) & he has stopped his BP meds and BP has not been monitored at home.  Today's BP is at goal - 136/80. Patient has had no complaints of any cardiac type chest pain, palpitations, dyspnea/orthopnea/PND, dizziness, claudication, or dependent edema.     Hyperlipidemia is controlled with diet. Patient denies myalgias or other med SE's. 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  Patient d/c'd his amitriptyline due to sedation which was also a problem with higher doses of Gabapentin. Last A1c was almost to goal: Lab Results  Component Value Date   HGBA1C 5.9 (H) 03/20/2016      Further, the patient also has history of Vitamin D Deficiency ("24" in 2008) and supplements vitamin D without any suspected side-effects. Last vitamin D was still very low (goal 70-100): Lab Results  Component Value Date   VD25OH 44 12/04/2015   Current Outpatient Prescriptions on File Prior to Visit  Medication Sig  . aspirin 81 MG  Take 81 mg by mouth daily.  . citalopram 40 MG  OFF this med  . hydrochlorothiazide  25 MG  TAKE ONE TAB ONCE DAILY  . INVOKAMET [phone] MG  OFF this med  . lisinopril  20 MG  OFF this med   Vit D 50,000 units  Takes 3 x / week  . amitriptyline 25 MG  OFF this med   Allergies  Allergen Reactions  . Morphine And Related   . Codeine Camsylate [Codeine] Rash   PMHx:   Past Medical History:    Diagnosis Date  . Diabetic neuropathy ([name])   . History of hepatitis C   . History of kidney stones   . Hyperlipidemia   . Hypertension   . Other testicular hypofunction   . Type II or unspecified type diabetes mellitus    . Vitamin D deficiency    Immunization History  Administered Date(s) Administered  . PPD Test 08/31/2013, 11/08/2014  . Td 01/21/2005   Past Surgical History:  Procedure Laterality Date  . CHOLECYSTECTOMY  1987  . ORIF TIBIA FRACTURE     FHx:    Reviewed / unchanged  SHx:    Reviewed / unchanged  Systems Review:  Constitutional: Denies fever, chills, wt changes, headaches, insomnia, fatigue, night sweats, change in appetite. Eyes: Denies redness, blurred vision, diplopia, discharge, itchy, watery eyes.  ENT: Denies discharge, congestion, post nasal drip, epistaxis, sore throat, earache, hearing loss, dental pain, tinnitus, vertigo, sinus pain, snoring.  CV: Denies chest pain, palpitations, irregular heartbeat, syncope, dyspnea, diaphoresis, orthopnea, PND, claudication or edema. Respiratory: denies cough, dyspnea, DOE, pleurisy, hoarseness, laryngitis, wheezing.  Gastrointestinal: Denies dysphagia, odynophagia, heartburn, reflux, water brash, abdominal pain or cramps, nausea, vomiting, bloating, diarrhea, constipation, hematemesis, melena, hematochezia  or hemorrhoids. Genitourinary: Denies dysuria, frequency, urgency, nocturia, hesitancy, discharge, hematuria or flank pain. Musculoskeletal:  Denies arthralgias, myalgias, stiffness, jt. swelling, pain, limping or strain/sprain.  Skin: Denies pruritus, rash, hives, warts, acne, eczema or change in skin lesion(s). Neuro: No weakness, tremor, incoordination, spasms, but has painful dysthesias of the soles of his feet.  Psychiatric: Denies confusion, memory loss or sensory loss. Endo: Denies change in weight, skin or hair change.  Heme/Lymph: No excessive bleeding, bruising or enlarged lymph nodes.  Physical  Exam  BP 136/80   Pulse 72   Temp 97.6 F (36.4 C)   Resp 16   Ht 6\' 3"  (1.905 m)   Wt 277 lb 9.6 oz (125.9 kg)   BMI 34.70 kg/m   Appears well nourished, well groomed  and in no distress.  Eyes: PERRLA, EOMs, conjunctiva no swelling or erythema. Sinuses: No frontal/maxillary tenderness ENT/Mouth: EAC's clear, TM's nl w/o erythema, bulging. Nares clear w/o erythema, swelling, exudates. Oropharynx clear without erythema or exudates. Oral hygiene is good. Tongue normal, non obstructing. Hearing intact.  Neck: Supple. Thyroid nl. Car 2+/2+ without bruits, nodes or JVD. Chest: Respirations nl with BS clear & equal w/o rales, rhonchi, wheezing or stridor.  Cor: Heart sounds normal w/ regular rate and rhythm without sig. murmurs, gallops, clicks or rubs. Peripheral pulses normal and equal  without edema.  Abdomen: Soft & bowel sounds normal. Non-tender w/o guarding, rebound, hernias, masses or organomegaly.  Lymphatics: Unremarkable.  Musculoskeletal: Full ROM all peripheral extremities, joint stability, 5/5 strength and normal gait.  Skin: Warm, dry without exposed rashes, lesions or ecchymosis apparent.  Neuro: Cranial nerves intact, reflexes equal bilaterally. Sensation decreased in a stocking distribution  to touch, vibratory and Monofilament to the toes bilaterally. Tendon reflexes grossly intact.  Pysch: Alert & oriented x 3.  Insight and judgement nl & appropriate. No ideations.  Assessment and Plan:  1. Essential hypertension  - Continue medication, monitor blood pressure at home.  - Continue DASH diet. Reminder to go to the ER if any CP,  SOB, nausea, dizziness, severe HA, changes vision/speech,  left arm numbness and tingling and jaw pain.  - CBC with Differential/Platelet - BASIC METABOLIC PANEL WITH GFR - Magnesium - TSH  2. Hyperlipidemia, mixed  - Continue diet/meds, exercise,& lifestyle modifications.  - Continue monitor periodic cholesterol/liver & renal  functions   - Hepatic function panel - Lipid panel - TSH  3. 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  - Monitor appropriate labs.  - Hemoglobin A1c - Insulin, random  4. Vitamin D deficiency  - Continue supplementation.  - VITAMIN D 25 Hydroxy  5. Medication management  - CBC with Differential/Platelet - BASIC METABOLIC PANEL WITH GFR - Hepatic function panel - Magnesium - Lipid panel - TSH - Hemoglobin A1c - Insulin, random - VITAMIN D 25 Hydroxy        Discussed  regular exercise, BP monitoring, weight control to achieve/maintain BMI less than 25 and discussed med and SE's. Recommended labs to assess and monitor clinical status with further disposition pending results of labs. Over 30 minutes of exam, counseling, chart review was performed.

## 2016-06-12 LAB — HEPATIC FUNCTION PANEL
ALBUMIN: 4.1 g/dL (ref 3.6–5.1)
ALT: 50 U/L — AB (ref 9–46)
AST: 38 U/L — ABNORMAL HIGH (ref 10–35)
Alkaline Phosphatase: 63 U/L (ref 40–115)
Bilirubin, Direct: 0.1 mg/dL (ref <=0.2)
Indirect Bilirubin: 0.5 mg/dL (ref 0.2–1.2)
TOTAL PROTEIN: 6.8 g/dL (ref 6.1–8.1)
Total Bilirubin: 0.6 mg/dL (ref 0.2–1.2)

## 2016-06-12 LAB — LIPID PANEL
CHOLESTEROL: 192 mg/dL (ref <200)
HDL: 25 mg/dL — ABNORMAL LOW (ref >40)
LDL Cholesterol: 104 mg/dL — ABNORMAL HIGH (ref <100)
TRIGLYCERIDES: 313 mg/dL — AB (ref <150)
Total CHOL/HDL Ratio: 7.7 Ratio — ABNORMAL HIGH (ref <5.0)
VLDL: 63 mg/dL — ABNORMAL HIGH (ref <30)

## 2016-06-12 LAB — BASIC METABOLIC PANEL WITH GFR
BUN: 21 mg/dL (ref 7–25)
CALCIUM: 9.1 mg/dL (ref 8.6–10.3)
CO2: 23 mmol/L (ref 20–31)
Chloride: 101 mmol/L (ref 98–110)
Creat: 1.05 mg/dL (ref 0.70–1.33)
GFR, EST NON AFRICAN AMERICAN: 78 mL/min (ref >=60)
Glucose, Bld: 177 mg/dL — ABNORMAL HIGH (ref 65–99)
POTASSIUM: 4.2 mmol/L (ref 3.5–5.3)
Sodium: 138 mmol/L (ref 135–146)

## 2016-06-12 LAB — VITAMIN D 25 HYDROXY (VIT D DEFICIENCY, FRACTURES): Vit D, 25-Hydroxy: 90 ng/mL (ref 30–100)

## 2016-06-12 LAB — INSULIN, RANDOM: INSULIN: 65.2 u[IU]/mL — AB (ref 2.0–19.6)

## 2016-06-12 LAB — TSH: TSH: 1.74 mIU/L (ref 0.40–4.50)

## 2016-06-12 LAB — HEMOGLOBIN A1C
Hgb A1c MFr Bld: 6.4 % — ABNORMAL HIGH (ref <5.7)
MEAN PLASMA GLUCOSE: 137 mg/dL

## 2016-06-12 LAB — MAGNESIUM: MAGNESIUM: 2 mg/dL (ref 1.5–2.5)

## 2016-06-13 ENCOUNTER — Other Ambulatory Visit: Payer: Self-pay | Admitting: Internal Medicine

## 2016-06-13 DIAGNOSIS — E1122 Type 2 diabetes mellitus with diabetic chronic kidney disease: Secondary | ICD-10-CM

## 2016-06-13 DIAGNOSIS — N182 Chronic kidney disease, stage 2 (mild): Principal | ICD-10-CM

## 2016-06-13 MED ORDER — METFORMIN HCL ER 500 MG PO TB24: ORAL_TABLET | ORAL | 1 refills | Status: DC

## 2016-06-14 ENCOUNTER — Other Ambulatory Visit: Payer: Self-pay | Admitting: *Deleted

## 2016-06-14 MED ORDER — GABAPENTIN 100 MG PO CAPS: 100.0000 mg | ORAL_CAPSULE | Freq: Three times a day (TID) | ORAL | 0 refills | Status: DC

## 2016-08-21 ENCOUNTER — Ambulatory Visit (INDEPENDENT_AMBULATORY_CARE_PROVIDER_SITE_OTHER): Payer: 59 | Admitting: Podiatry

## 2016-08-21 ENCOUNTER — Ambulatory Visit (INDEPENDENT_AMBULATORY_CARE_PROVIDER_SITE_OTHER): Payer: 59

## 2016-08-21 ENCOUNTER — Encounter: Payer: Self-pay | Admitting: Podiatry

## 2016-08-21 ENCOUNTER — Ambulatory Visit: Payer: 59

## 2016-08-21 ENCOUNTER — Other Ambulatory Visit: Payer: Self-pay | Admitting: Podiatry

## 2016-08-21 VITALS — BP 120/68 | HR 73 | Resp 16 | Ht 76.0 in | Wt 270.0 lb

## 2016-08-21 DIAGNOSIS — G8929 Other chronic pain: Secondary | ICD-10-CM

## 2016-08-21 DIAGNOSIS — M779 Enthesopathy, unspecified: Secondary | ICD-10-CM

## 2016-08-21 DIAGNOSIS — M25571 Pain in right ankle and joints of right foot: Secondary | ICD-10-CM

## 2016-08-21 DIAGNOSIS — M79672 Pain in left foot: Secondary | ICD-10-CM

## 2016-08-21 MED ORDER — TRIAMCINOLONE ACETONIDE 10 MG/ML IJ SUSP
10.0000 mg | Freq: Once | INTRAMUSCULAR | Status: AC
  Administered 2016-08-21: 10 mg

## 2016-08-21 NOTE — Progress Notes (Signed)
Subjective:    Patient ID: [name], male   DOB: 56 y.o.   MRN: [id number]   HPI patient presents stating the ankle joint right has become tender again over the last few months and the top of the left foot has become irritated. States that overall he's been doing pretty well but he does have obesity and is had previous leg fracture 1985 that has caused him to develop arthritis    Review of Systems  All other systems reviewed and are negative.       Objective:  Physical Exam  Cardiovascular: Intact distal pulses.   Musculoskeletal: Normal range of motion.  Neurological: He is alert.  Skin: Skin is warm.  Nursing note and vitals reviewed.  neurovascular status intact muscle strength was adequate with significant diminishment of range of motion ankle joint right subtalar joint right with mild discoloration of the digits that he states is normal appearance for him. Patient's found have good digital perfusion is well oriented 3 with quite a bit of discomfort in the right ankle joint and discomfort in the left forefoot around the extensor hallucis longus tendon in the mid metatarsal area     Assessment:    Tendinitis left with capsulitis right     Plan:    H&P conditions reviewed arthritis and tendinitis capsulitis discussed. 

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
## 2016-09-25 ENCOUNTER — Encounter: Payer: Self-pay | Admitting: Physician Assistant

## 2016-09-25 ENCOUNTER — Ambulatory Visit (INDEPENDENT_AMBULATORY_CARE_PROVIDER_SITE_OTHER): Payer: 59 | Admitting: Physician Assistant

## 2016-09-25 VITALS — BP 124/86 | HR 88 | Temp 97.9°F | Resp 16 | Ht 76.0 in | Wt 270.8 lb

## 2016-09-25 DIAGNOSIS — E1140 Type 2 diabetes mellitus with diabetic neuropathy, unspecified: Secondary | ICD-10-CM | POA: Diagnosis not present

## 2016-09-25 DIAGNOSIS — E349 Endocrine disorder, unspecified: Secondary | ICD-10-CM

## 2016-09-25 DIAGNOSIS — I10 Essential (primary) hypertension: Secondary | ICD-10-CM | POA: Diagnosis not present

## 2016-09-25 DIAGNOSIS — J449 Chronic obstructive pulmonary disease, unspecified: Secondary | ICD-10-CM | POA: Diagnosis not present

## 2016-09-25 DIAGNOSIS — N182 Chronic kidney disease, stage 2 (mild): Secondary | ICD-10-CM

## 2016-09-25 DIAGNOSIS — E782 Mixed hyperlipidemia: Secondary | ICD-10-CM | POA: Diagnosis not present

## 2016-09-25 DIAGNOSIS — E1149 Type 2 diabetes mellitus with other diabetic neurological complication: Secondary | ICD-10-CM

## 2016-09-25 DIAGNOSIS — Z79899 Other long term (current) drug therapy: Secondary | ICD-10-CM | POA: Diagnosis not present

## 2016-09-25 DIAGNOSIS — F17200 Nicotine dependence, unspecified, uncomplicated: Secondary | ICD-10-CM

## 2016-09-25 DIAGNOSIS — E1122 Type 2 diabetes mellitus with diabetic chronic kidney disease: Secondary | ICD-10-CM

## 2016-09-25 NOTE — Patient Instructions (Signed)
Continue to take metformin as discussed.  Consider starting an allergy medication for this season.  Please pick one of the over the counter allergy medications below and take it once daily for allergies.  Claritin or loratadine cheapest but likely the weakest  Zyrtec or certizine at night because it can make you sleepy The strongest is allegra or fexafinadine  Cheapest at [employer], [employer], [employer] If you have a smart phone, please look up Smoke Free app, this will help you stay on track and give you information about money you have saved, life that you have gained back and a ton of more information.   We are giving you chantix for smoking cessation. You can do it! And we are here to help! You may have heard some scary side effects about chantix, the three most common I hear about are nausea, crazy dreams and depression.  However, I like for my patients to try to stay on 1/2 a tablet twice a day rather than one tablet twice a day as normally prescribed. This helps decrease the chances of side effects and helps save money by making a one month prescription last two months  Please start the prescription this way:  Start 1/2 tablet by mouth once daily after food with a full glass of water for 3 days Then do 1/2 tablet by mouth twice daily for 4 days. During this first week you can smoke, but try to stop after this week.  At this point we have several options: 1) continue on 1/2 tablet twice a day- which I encourage you to do. You can stay on this dose the rest of the time on the medication or if you still feel the need to smoke you can do one of the two options below. 2) do one tablet in the morning and 1/2 in the evening which helps decrease dreams. 3) do one tablet twice a day.   What if I miss a dose? If you miss a dose, take it as soon as you can. If it is almost time for your next dose, take only that dose. Do not take double or extra doses.  What should I watch for while using this  medicine? Visit your doctor or health care professional for regular check ups. Ask for ongoing advice and encouragement from your doctor or healthcare professional, friends, and family to help you quit. If you smoke while on this medication, quit again  Your mouth may get dry. Chewing sugarless gum or hard candy, and drinking plenty of water may help. Contact your doctor if the problem does not go away or is severe.  You may get drowsy or dizzy. Do not drive, use machinery, or do anything that needs mental alertness until you know how this medicine affects you. Do not stand or sit up quickly, especially if you are an older patient.   The use of this medicine may increase the chance of suicidal thoughts or actions. Pay special attention to how you are responding while on this medicine. Any worsening of mood, or thoughts of suicide or dying should be reported to your health care professional right away.  ADVANTAGES OF QUITTING SMOKING  Within 20 minutes, blood pressure decreases. Your pulse is at normal level.  After 8 hours, carbon monoxide levels in the blood return to normal. Your oxygen level increases.  After 24 hours, the chance of having a heart attack starts to decrease. Your breath, hair, and body stop smelling like smoke.  After 48 hours,  damaged nerve endings begin to recover. Your sense of taste and smell improve.  After 72 hours, the body is virtually free of nicotine. Your bronchial tubes relax and breathing becomes easier.  After 2 to 12 weeks, lungs can hold more air. Exercise becomes easier and circulation improves.  After 1 year, the risk of coronary heart disease is cut in half.  After 5 years, the risk of stroke falls to the same as a nonsmoker.  After 10 years, the risk of lung cancer is cut in half and the risk of other cancers decreases significantly.  After 15 years, the risk of coronary heart disease drops, usually to the level of a nonsmoker.  You will have  extra money to spend on things other than cigarettes.

## 2016-09-25 NOTE — Progress Notes (Signed)
Assessment and Plan:   Essential hypertension - continue medications, DASH diet, exercise and monitor at home. Call if greater than 130/80.  -     CBC with Differential/Platelet -     BASIC METABOLIC PANEL WITH GFR -     Hepatic function panel -     TSH  Hyperlipidemia -continue medications, check lipids, decrease fatty foods, increase activity.  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 -     BASIC METABOLIC PANEL WITH GFR -     Hemoglobin A1c  Morbid obesity, unspecified obesity type ([name]) Obesity with co morbidities- long discussion about weight loss, diet, and exercise  T2_NIDDM w/Peripheral Neuropathy - has stopped meds, likely due to better glucose control/weight loss -     Hemoglobin A1c  Vitamin D deficiency -     VITAMIN D 25 Hydroxy (Vit-D Deficiency, Fractures)  Medication management -     Magnesium  Tobacco use disorder/cough Smoking cessation-  counseled patient on the dangers of tobacco use, advised patient to stop smoking, and reviewed strategies to maximize success, declined medication to help, will call if changes his minde   Continue diet and meds as discussed. Further disposition pending results of labs. Discussed med's effects and SE's.   HPI 56 y.o. male  presents for 3 month follow up with hypertension, hyperlipidemia, diabetes and vitamin D. His blood pressure has been controlled at home, he is on the 10mg  lisinopril, today their BP is BP: 124/86 He does not workout but is active at work. He denies chest pain, shortness of breath, dizziness.  He is not on cholesterol medication and denies myalgias. His cholesterol is at goal. The cholesterol was:  06/11/2016: Cholesterol 192; HDL 25; LDL Cholesterol 104; Triglycerides 313 He has been  working on diet and exercise for Diabetes with p. neuropathy and CKD, off invokana, just on metofmrin and states pain in legs is better he is on ACE/ARB, is on bASA, and denies polydipsia, polyuria and visual disturbances. Last A1C  was:  Lab Results  Component Value Date   HGBA1C 6.4 (H) 06/11/2016   HGBA1C 5.9 (H) 03/20/2016   HGBA1C 6.3 (H) 12/04/2015   Patient is on Vitamin D supplement. 06/11/2016: Vit D, 25-Hydroxy 90  Patient is morbidly obese with BMI is Body mass index is 32.96 kg/m., he is working on diet and exercise.  Wt Readings from Last 3 Encounters:  09/25/16 270 lb 12.8 oz (122.8 kg)  08/21/16 270 lb (122.5 kg)  06/11/16 277 lb 9.6 oz (125.9 kg)   Current Medications:  Current Outpatient Prescriptions on File Prior to Visit  Medication Sig  . aspirin 81 MG tablet Take 81 mg by mouth daily.  . citalopram (CELEXA) 40 MG tablet TAKE ONE TABLET BY MOUTH ONCE DAILY FOR  MOOD  . hydrochlorothiazide (HYDRODIURIL) 25 MG tablet TAKE ONE TABLET BY MOUTH ONCE DAILY  . lisinopril (PRINIVIL,ZESTRIL) 20 MG tablet TAKE ONE TABLET BY MOUTH ONCE DAILY  . metFORMIN (GLUCOPHAGE XR) 500 MG 24 hr tablet Take 2 tablets 2 x / day for Diabetes   No current facility-administered medications on file prior to visit.     Medical History:  Past Medical History:  Diagnosis Date  . Diabetic neuropathy ([name])   . History of hepatitis C   . History of kidney stones   .  Hyperlipidemia   . Hypertension   . Other testicular hypofunction   . Type II or unspecified type diabetes mellitus without mention of complication, not stated as uncontrolled   . Vitamin D deficiency    Allergies:  Allergies  Allergen Reactions  . Gabapentin     Pt stated, "Made me have a bad attitude; did not relieve pain"  . Codeine Camsylate [Codeine] Rash  . Morphine And Related Rash    Review of Systems  Constitutional: Negative for chills and fever.  Respiratory: Negative for cough, hemoptysis, sputum production,  shortness of breath and wheezing.   Cardiovascular: Negative for chest pain, palpitations, orthopnea, claudication, leg swelling and PND.  Genitourinary: Negative for frequency.  Neurological: Negative for dizziness, tingling, tremors, sensory change, speech change, focal weakness, seizures and loss of consciousness.  All other systems reviewed and are negative.  Family history- Review and unchanged Social history- Review and unchanged Physical Exam: BP 124/86   Pulse 88   Temp 97.9 F (36.6 C)   Resp 16   Ht 6\' 4"  (1.93 m)   Wt 270 lb 12.8 oz (122.8 kg)   SpO2 98%   BMI 32.96 kg/m  Wt Readings from Last 3 Encounters:  09/25/16 270 lb 12.8 oz (122.8 kg)  08/21/16 270 lb (122.5 kg)  06/11/16 277 lb 9.6 oz (125.9 kg)   General Appearance: Well nourished, in no apparent distress. Eyes: PERRLA, EOMs, conjunctiva no swelling or erythema Sinuses: No Frontal/maxillary tenderness ENT/Mouth: Ext aud canals clear, TMs without erythema, bulging. No erythema, swelling, or exudate on post pharynx.  Tonsils not swollen or erythematous. Hearing normal.  Neck: Supple, thyroid normal.  Respiratory: Respiratory effort normal, BS equal bilaterally with wheezing/rhonchi (primarily anterior on the right) without rales, rhonchi, or stridor.  Cardio: RRR with no MRGs. Decreased bilateral peripheral pulses without edema with varicose veins.  Abdomen: Soft, + BS, obese  Non tender, no guarding, rebound, hernias, masses. Lymphatics: Non tender without lymphadenopathy.  Musculoskeletal: Full ROM, 5/5 strength, normal gait.  Skin: Warm, dry without rashes, lesions, ecchymosis.  Neuro: Cranial nerves intact. No cerebellar symptoms. Sensation decreased bilateral legs to knees. Negative phalens and tinels sign, good bilateral strength.  Psych: Awake and oriented X 3, normal affect, Insight and Judgment appropriate.    Quentin Mulling, PA-C 5:14 PM [facility]

## 2016-09-26 LAB — HEPATIC FUNCTION PANEL
AG RATIO: 1.5 (calc) (ref 1.0–2.5)
ALT: 69 U/L — ABNORMAL HIGH (ref 9–46)
AST: 45 U/L — ABNORMAL HIGH (ref 10–35)
Albumin: 4.4 g/dL (ref 3.6–5.1)
Alkaline phosphatase (APISO): 58 U/L (ref 40–115)
BILIRUBIN INDIRECT: 0.6 mg/dL (ref 0.2–1.2)
BILIRUBIN TOTAL: 0.7 mg/dL (ref 0.2–1.2)
Bilirubin, Direct: 0.1 mg/dL (ref 0.0–0.2)
GLOBULIN: 2.9 g/dL (ref 1.9–3.7)
TOTAL PROTEIN: 7.3 g/dL (ref 6.1–8.1)

## 2016-09-26 LAB — BASIC METABOLIC PANEL WITH GFR
BUN: 23 mg/dL (ref 7–25)
CO2: 24 mmol/L (ref 20–32)
CREATININE: 1 mg/dL (ref 0.70–1.33)
Calcium: 9.8 mg/dL (ref 8.6–10.3)
Chloride: 101 mmol/L (ref 98–110)
GFR, EST AFRICAN AMERICAN: 96 mL/min/{1.73_m2} (ref >=60)
GFR, EST NON AFRICAN AMERICAN: 83 mL/min/{1.73_m2} (ref >=60)
Glucose, Bld: 99 mg/dL (ref 65–99)
POTASSIUM: 4.7 mmol/L (ref 3.5–5.3)
Sodium: 135 mmol/L (ref 135–146)

## 2016-09-26 LAB — LIPID PANEL
CHOL/HDL RATIO: 8.2 (calc) — AB (ref <5.0)
CHOLESTEROL: 214 mg/dL — AB (ref <200)
HDL: 26 mg/dL — AB (ref >40)
Non-HDL Cholesterol (Calc): 188 mg/dL (calc) — ABNORMAL HIGH (ref <130)
Triglycerides: 426 mg/dL — ABNORMAL HIGH (ref <150)

## 2016-09-26 LAB — CBC WITH DIFFERENTIAL/PLATELET
BASOS PCT: 0.8 %
Basophils Absolute: 100 cells/uL (ref 0–200)
EOS ABS: 175 {cells}/uL (ref 15–500)
Eosinophils Relative: 1.4 %
HEMATOCRIT: 46.6 % (ref 38.5–50.0)
HEMOGLOBIN: 16.5 g/dL (ref 13.2–17.1)
LYMPHS ABS: 4588 {cells}/uL — AB (ref 850–3900)
MCH: 33.1 pg — AB (ref 27.0–33.0)
MCHC: 35.4 g/dL (ref 32.0–36.0)
MCV: 93.6 fL (ref 80.0–100.0)
MONOS PCT: 10.1 %
MPV: 12.3 fL (ref 7.5–12.5)
NEUTROS ABS: 6375 {cells}/uL (ref 1500–7800)
Neutrophils Relative %: 51 %
Platelets: 230 10*3/uL (ref 140–400)
RBC: 4.98 10*6/uL (ref 4.20–5.80)
RDW: 13.6 % (ref 11.0–15.0)
Total Lymphocyte: 36.7 %
WBC: 12.5 10*3/uL — ABNORMAL HIGH (ref 3.8–10.8)
WBCMIX: 1263 {cells}/uL — AB (ref 200–950)

## 2016-09-26 LAB — HEMOGLOBIN A1C
Hgb A1c MFr Bld: 6.6 % of total Hgb — ABNORMAL HIGH (ref <5.7)
MEAN PLASMA GLUCOSE: 143 (calc)
eAG (mmol/L): 7.9 (calc)

## 2016-09-26 LAB — TSH: TSH: 1.59 mIU/L (ref 0.40–4.50)

## 2016-09-26 NOTE — Progress Notes (Signed)
LVM for pt to return office call for LAB results.

## 2016-09-26 NOTE — Progress Notes (Signed)
Pt aware of lab results & voiced understanding of those results.

## 2016-10-09 ENCOUNTER — Other Ambulatory Visit: Payer: Self-pay | Admitting: Internal Medicine

## 2016-10-13 ENCOUNTER — Emergency Department (HOSPITAL_COMMUNITY): Payer: 59

## 2016-10-13 ENCOUNTER — Emergency Department (HOSPITAL_COMMUNITY)
Admission: EM | Admit: 2016-10-13 | Discharge: 2016-10-13 | Disposition: A | Discharge status: Home / Self Care | Payer: 59 | Attending: Emergency Medicine | Admitting: Emergency Medicine

## 2016-10-13 ENCOUNTER — Encounter (HOSPITAL_COMMUNITY): Payer: Self-pay | Admitting: Emergency Medicine

## 2016-10-13 DIAGNOSIS — Z5321 Procedure and treatment not carried out due to patient leaving prior to being seen by health care provider: Secondary | ICD-10-CM | POA: Insufficient documentation

## 2016-10-13 DIAGNOSIS — R0602 Shortness of breath: Secondary | ICD-10-CM | POA: Diagnosis present

## 2016-10-13 LAB — CBC
HEMATOCRIT: 46.3 % (ref 39.0–52.0)
Hemoglobin: 16 g/dL (ref 13.0–17.0)
MCH: 33.5 pg (ref 26.0–34.0)
MCHC: 34.6 g/dL (ref 30.0–36.0)
MCV: 96.9 fL (ref 78.0–100.0)
Platelets: 172 10*3/uL (ref 150–400)
RBC: 4.78 MIL/uL (ref 4.22–5.81)
RDW: 13.4 % (ref 11.5–15.5)
WBC: 11.3 10*3/uL — ABNORMAL HIGH (ref 4.0–10.5)

## 2016-10-13 LAB — BASIC METABOLIC PANEL
Anion gap: 19 — ABNORMAL HIGH (ref 5–15)
BUN: 16 mg/dL (ref 6–20)
CALCIUM: 9.4 mg/dL (ref 8.9–10.3)
CO2: 15 mmol/L — AB (ref 22–32)
Chloride: 99 mmol/L — ABNORMAL LOW (ref 101–111)
Creatinine, Ser: 0.96 mg/dL (ref 0.61–1.24)
GFR calc Af Amer: >60 mL/min (ref >60)
GLUCOSE: 179 mg/dL — AB (ref 65–99)
Potassium: 4 mmol/L (ref 3.5–5.1)
Sodium: 133 mmol/L — ABNORMAL LOW (ref 135–145)

## 2016-10-13 LAB — I-STAT TROPONIN, ED: TROPONIN I, POC: 0 ng/mL (ref 0.00–0.08)

## 2016-10-13 NOTE — ED Triage Notes (Addendum)
Reports sudden onset of 10/10 chest pressure and SOB while raking leaves about an hour ago.  Wife states pt was diaphoretic and had dry heaves.  Then became cool and clammy.  Pt states pain is almost completely gone now (1/10).  Denies nausea at present.  Last smoked marijuana this morning.

## 2016-10-13 NOTE — ED Notes (Signed)
Patient states that he feels better and is going home.

## 2016-10-14 ENCOUNTER — Other Ambulatory Visit: Payer: Self-pay | Admitting: Internal Medicine

## 2016-10-16 ENCOUNTER — Encounter: Payer: Self-pay | Admitting: Internal Medicine

## 2016-10-16 ENCOUNTER — Ambulatory Visit (INDEPENDENT_AMBULATORY_CARE_PROVIDER_SITE_OTHER): Payer: 59 | Admitting: Internal Medicine

## 2016-10-16 VITALS — BP 100/62 | HR 80 | Temp 97.2°F | Resp 18 | Ht 76.0 in | Wt 268.6 lb

## 2016-10-16 DIAGNOSIS — R091 Pleurisy: Secondary | ICD-10-CM

## 2016-10-16 NOTE — Progress Notes (Signed)
  Subjective:    Patient ID: [name], male    DOB: 1960-12-19, 56 y.o.   MRN: [id number]  HPI  This nice 56 yo MWM w/ HTN, HLD, T2_DM went to the ER 3 days ago for SOB and query CP.  He had Nl EKG & CXR, negative Troponins His sx's resolved during a long wait at the ER, so he decided to leave . He describes a nonexertional sharp anterior mid sternal discomfort. No N/V, diaphoresis and no exertional component. Since leaving the ER, he reports no more CP or dyspnea.   Medication Sig  . aspirin 81 MG tablet Take 81 mg by mouth daily.  . citalopram  40 MG tablet TAKE ONE TABLET BY MOUTH ONCE DAILY FOR  MOOD  . hctz 25 MG tablet TAKE ONE TABLET BY MOUTH ONCE DAILY  . lisinopril - 20 MG tablet TAKE 1 TABLET BY MOUTH ONCE DAILY  . metFORMIN- XR- 500 MG - Take 2 tablets 2 x / day for Diabetes   Allergies  Allergen Reactions  . Gabapentin     Pt stated, "Made me have a bad attitude; did not relieve pain"  . Invokana [Canagliflozin]     Extremity edema/caused pain  . Codeine Camsylate [Codeine] Rash  . Morphine And Related Rash   Past Medical History:  Diagnosis Date  . Diabetic neuropathy ([name])   . History of hepatitis C   . History of kidney stones   . Hyperlipidemia   . Hypertension   . Other testicular hypofunction   . Type II or unspecified type diabetes mellitus without mention of complication, not stated as uncontrolled   . Vitamin D deficiency     Review of Systems  10 point systems review negative except as above.    Objective:   Physical Exam  BP 100/62   Pulse 80   Temp (!) 97.2 F (36.2 C)   Resp 18   Ht  (1.93 m)   Wt 268 lb 9.6 oz (121.8 kg)   BMI 32.70 kg/m   HEENT - WNL. Neck - supple.  Chest - Clear equal BS.Sl mid sternal tenderness.  Cor - Nl HS. RRR w/o sig MGR. PP 1(+). No edema. MS- FROM w/o deformities.  Gait Nl. Neuro -  Nl w/o focal abnormalities.    Assessment & Plan:   1. Pleurisy, resolved  - advise to return or call if  discomfort reoccurs.

## 2016-10-16 NOTE — Patient Instructions (Signed)
Pleurisy  Pleurisy, also called pleuritis, is irritation and swelling (inflammation) of the linings of the lungs. The linings of the lungs are called pleura. They cover the outside of the lungs and the inside of the chest wall. There is a small amount of fluid (pleural fluid) between the pleura that allows the lungs to move in and out smoothly when you breathe. Pleurisy causes the pleura to be rough and dry and to rub together when you breathe, which is painful. In some cases, pleurisy can cause pleural fluid to build up between the pleura (pleural effusion). What are the causes? Common causes of this condition include:  A lung infection caused by bacteria or a virus.  A chest injury.  Diseases that can cause lung inflammation. These include rheumatoid arthritis, lupus, sickle cell disease, inflammatory bowel disease, and pancreatitis.  Heart or chest surgery.  Lung damage from inhaling asbestos.  A lung reaction to certain medicines.  Sometimes the cause is unknown.  What are the signs or symptoms? Chest pain is the main symptom of this condition. The pain is usually on one side. Chest pain may start suddenly and be sharp or stabbing. It may become a constant dull ache. You may also feel pain in your back or shoulder. The pain may get worse when you cough, take deep breaths, or make sudden movements. Other symptoms may include:  Shortness of breath.  Noisy breathing (wheezing).  Cough.  Chills.  Fever.  How is this diagnosed? This condition may be diagnosed based on:  Your medical history.  Your symptoms.  A physical exam. Your health care provider will listen to your breathing with a stethoscope to check for a rough, rubbing sound (friction rub). If you have pleural effusion, your breathing sounds may be muffled.  Tests, such as: ? Blood tests to check for infections or diseases and to measure the oxygen in your blood. ? Imaging studies of your lungs. These may include a  chest X-ray, ultrasound, MRI, or CT scan. ? A procedure to remove pleural fluid with a needle for testing (thoracentesis).  How is this treated? Treatment for this condition depends on the cause. Pleurisy that was caused by a virus usually clears up within 2 weeks. Treatment for pleurisy may include:   NSAIDs to help relieve pain and swelling.  Antibiotic medicines, if your condition was caused by a bacterial infection.  Prescription pain or cough medicine.  Medicines to dissolve a blood clot, if your condition was caused by pulmonary embolism.  Removal of pleural fluid or air.  Follow these instructions at home: Medicines  Take over-the-counter and prescription medicines only as told by your health care provider.  If you were prescribed an antibiotic, take it as told by your health care provider. Do not stop taking the antibiotic even if you start to feel better. Activity  Rest and return to your normal activities as told by your health care provider. Ask your health care provider what activities are safe for you.  Do not drive or use heavy machinery while taking prescription pain medicine. General instructions  Monitor your pleurisy for any changes.  Take deep breaths often, even if it is painful. This can help prevent lung infection (pneumonia) and collapse of lung tissue (atelectasis).  When lying down, lie on your painful side. This may reduce pain.  Do not smoke. If you need help quitting, ask your health care provider.  Keep all follow-up visits as told by your health care provider. This is  important. Contact a health care provider if:  You have pain that: ? Gets worse. ? Does not get better with medicine. ? Lasts for more than 1 week.  You have a fever or chills.  Your cough or shortness of breath is not improving at home.  You cough up pus-like (purulent) secretions. Get help right away if:  Your lips, fingernails, or toenails darken or turn blue.  You  cough up blood.  You have any of the following symptoms that get worse: ? Difficulty breathing. ? Shortness of breath. ? Wheezing.  You have pain that spreads into your neck, arms, or jaw.  You develop a rash.  You vomit.  You faint. Summary  Pleurisy is inflammation of the linings of the lungs (pleura).  Pleurisy causes pain that makes it difficult for you to breathe or cough.  Pleurisy is often caused by an underlying infection or disease.  Treatment of pleurisy depends on the cause, and it often includes medicines.

## 2016-11-13 ENCOUNTER — Other Ambulatory Visit: Payer: Self-pay | Admitting: Internal Medicine

## 2016-11-13 DIAGNOSIS — I10 Essential (primary) hypertension: Secondary | ICD-10-CM

## 2016-12-25 ENCOUNTER — Ambulatory Visit: Payer: 59 | Admitting: Internal Medicine

## 2016-12-25 ENCOUNTER — Encounter: Payer: Self-pay | Admitting: Internal Medicine

## 2016-12-25 VITALS — BP 122/70 | HR 80 | Temp 97.5°F | Resp 20 | Ht 76.0 in | Wt 279.4 lb

## 2016-12-25 DIAGNOSIS — Z23 Encounter for immunization: Secondary | ICD-10-CM

## 2016-12-25 DIAGNOSIS — E782 Mixed hyperlipidemia: Secondary | ICD-10-CM | POA: Diagnosis not present

## 2016-12-25 DIAGNOSIS — E559 Vitamin D deficiency, unspecified: Secondary | ICD-10-CM

## 2016-12-25 DIAGNOSIS — I10 Essential (primary) hypertension: Secondary | ICD-10-CM

## 2016-12-25 DIAGNOSIS — E1122 Type 2 diabetes mellitus with diabetic chronic kidney disease: Secondary | ICD-10-CM

## 2016-12-25 DIAGNOSIS — Z79899 Other long term (current) drug therapy: Secondary | ICD-10-CM

## 2016-12-25 DIAGNOSIS — N182 Chronic kidney disease, stage 2 (mild): Secondary | ICD-10-CM

## 2016-12-25 NOTE — Patient Instructions (Signed)

## 2016-12-25 NOTE — Progress Notes (Signed)
This very nice 56 y.o. MWM presents for 3 month follow up with Hypertension, Hyperlipidemia, Pre-Diabetes and Vitamin D Deficiency.      Patient is treated for HTN circa 1990 & BP has been controlled at home. Today's BP is at goal - 122/70. Patient has had no complaints of any cardiac type chest pain, palpitations, dyspnea / orthopnea / PND, dizziness, claudication, or dependent edema.     Hyperlipidemia is not controlled with diet. Patient denies myalgias or other med SE's. 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  Patient was intolerant to Amitriptyline and Gabapentin (sedation w/ both). Last A1c was not at goal: Lab Results  Component Value Date   HGBA1C 6.6 (H) 09/25/2016      Further, the patient also has history of Vitamin D Deficiency ("24"/2008) and supplements vitamin D without any suspected side-effects. Last vitamin D was at goal:  Lab Results  Component Value Date   VD25OH 90 06/11/2016   Current Outpatient Medications on File Prior to Visit  Medication Sig  . aspirin 81 MG tablet Take 81 mg by mouth daily.  . citalopram (CELEXA) 40 MG tablet TAKE ONE TABLET BY MOUTH ONCE DAILY FOR  MOOD  . hydrochlorothiazide (HYDRODIURIL) 25 MG tablet TAKE 1 TABLET BY MOUTH ONCE DAILY  . lisinopril (PRINIVIL,ZESTRIL) 20 MG tablet TAKE 1 TABLET BY MOUTH ONCE DAILY  . metFORMIN (GLUCOPHAGE XR) 500 MG 24 hr tablet Take 2 tablets 2 x / day for Diabetes   No current facility-administered medications on file prior to visit.    Allergies  Allergen Reactions  . Gabapentin     Pt stated, "Made me have a bad  attitude; did not relieve pain"  . Invokana [Canagliflozin]     Extremity edema/caused pain  . Codeine Camsylate [Codeine] Rash  . Morphine And Related Rash   PMHx:   Past Medical History:  Diagnosis Date  . Diabetic neuropathy ([name])   . History of hepatitis C   . History of kidney stones   . Hyperlipidemia   . Hypertension   . Other testicular hypofunction   . Type II or unspecified type diabetes mellitus without mention of complication, not stated as uncontrolled   . Vitamin D deficiency    Immunization History  Administered Date(s) Administered  . PPD Test 08/31/2013, 11/08/2014  . Td 01/21/2005  . Tdap 12/25/2016   Past Surgical History:  Procedure Laterality Date  . CHOLECYSTECTOMY  1987  . ORIF TIBIA FRACTURE     FHx:    Reviewed / unchanged  SHx:    Reviewed / unchanged  Systems Review:  Constitutional: Denies fever, chills, wt changes, headaches, insomnia, fatigue, night sweats, change in appetite. Eyes: Denies redness, blurred vision, diplopia, discharge, itchy, watery eyes.  ENT: Denies discharge, congestion, post nasal drip, epistaxis, sore throat, earache, hearing loss, dental pain, tinnitus, vertigo, sinus pain, snoring.  CV: Denies chest pain, palpitations, irregular heartbeat, syncope, dyspnea, diaphoresis, orthopnea, PND, claudication or edema. Respiratory: denies cough, dyspnea, DOE, pleurisy, hoarseness, laryngitis, wheezing.  Gastrointestinal: Denies  dysphagia, odynophagia, heartburn, reflux, water brash, abdominal pain or cramps, nausea, vomiting, bloating, diarrhea, constipation, hematemesis, melena, hematochezia  or hemorrhoids. Genitourinary: Denies dysuria, frequency, urgency, nocturia, hesitancy, discharge, hematuria or flank pain. Musculoskeletal: Denies arthralgias, myalgias, stiffness, jt. swelling, pain, limping or strain/sprain.  Skin: Denies pruritus, rash, hives, warts, acne, eczema or change in skin lesion(s). Neuro: No weakness, tremor,  incoordination, spasms, paresthesia or pain. Psychiatric: Denies confusion, memory loss or sensory loss. Endo: Denies change in weight, skin or hair change.  Heme/Lymph: No excessive bleeding, bruising or enlarged lymph nodes.  Physical Exam  BP 122/70   Pulse 80   Temp (!) 97.5 F (36.4 C)   Resp 20   Ht 6\' 4"  (1.93 m)   Wt 279 lb 6.4 oz (126.7 kg)   BMI 34.01 kg/m   Appears well nourished, well groomed  and in no distress.  Eyes: PERRLA, EOMs, conjunctiva no swelling or erythema. Sinuses: No frontal/maxillary tenderness ENT/Mouth: EAC's clear, TM's nl w/o erythema, bulging. Nares clear w/o erythema, swelling, exudates. Oropharynx clear without erythema or exudates. Oral hygiene is good. Tongue normal, non obstructing. Hearing intact.  Neck: Supple. Thyroid nl. Car 2+/2+ without bruits, nodes or JVD. Chest: Respirations nl with BS clear & equal w/o rales, rhonchi, wheezing or stridor.  Cor: Heart sounds normal w/ regular rate and rhythm without sig. murmurs, gallops, clicks or rubs. Peripheral pulses normal and equal  without edema.  Abdomen: Soft & bowel sounds normal. Non-tender w/o guarding, rebound, hernias, masses or organomegaly.  Lymphatics: Unremarkable.  Musculoskeletal: Full ROM all peripheral extremities, joint stability, 5/5 strength and normal gait.  Skin: Warm, dry without exposed rashes, lesions or ecchymosis apparent.  Neuro: Cranial nerves intact, reflexes equal bilaterally. Sensory-motor testing grossly intact. Tendon reflexes grossly intact.  Pysch: Alert & oriented x 3.  Insight and judgement nl & appropriate. No ideations.  Assessment and Plan:  1. Essential hypertension  - Continue medication, monitor blood pressure at home.  - Continue DASH diet. Reminder to go to the ER if any CP,  SOB, nausea, dizziness, severe HA, changes vision/speech.  - CBC with Differential/Platelet - BASIC METABOLIC PANEL WITH GFR - Magnesium - TSH  2. Hyperlipidemia,  mixed  - Continue diet/meds, exercise,& lifestyle modifications.  - Continue monitor periodic cholesterol/liver & renal functions   - Hepatic function panel - Lipid panel - TSH  3. Type 2 diabetes mellitus with stage 2 chronic kidney disease, without long-term current use of insulin ([name])  - Continue diet, exercise, lifestyle modifications.  - Monitor appropriate labs.  - Hemoglobin A1c - Insulin, random  4. Vitamin D deficiency  - Continue supplementation.  - VITAMIN D 25 Hydroxy   5. Medication management  - CBC with Differential/Platelet - BASIC METABOLIC PANEL WITH GFR - Hepatic function panel - Magnesium - Lipid panel - TSH - Hemoglobin A1c - Insulin, random - VITAMIN D 25 Hydroxy  6. Need for diphtheria-tetanus-pertussis (Tdap) vaccine  - Tdap vaccine greater than or equal to 7yo IM       Discussed  regular exercise, BP monitoring, weight control to achieve/maintain BMI less than 25 and discussed med and SE's. Recommended labs to assess and monitor clinical status with further disposition pending results of labs. Over 30 minutes of exam, counseling, chart review was performed.

## 2016-12-26 ENCOUNTER — Other Ambulatory Visit: Payer: Self-pay | Admitting: Internal Medicine

## 2016-12-26 LAB — CBC WITH DIFFERENTIAL/PLATELET
BASOS ABS: 46 {cells}/uL (ref 0–200)
Basophils Relative: 0.5 %
EOS ABS: 127 {cells}/uL (ref 15–500)
Eosinophils Relative: 1.4 %
HEMATOCRIT: 45.6 % (ref 38.5–50.0)
HEMOGLOBIN: 15.7 g/dL (ref 13.2–17.1)
LYMPHS ABS: 4550 {cells}/uL — AB (ref 850–3900)
MCH: 32.4 pg (ref 27.0–33.0)
MCHC: 34.4 g/dL (ref 32.0–36.0)
MCV: 94.2 fL (ref 80.0–100.0)
MPV: 12.5 fL (ref 7.5–12.5)
Monocytes Relative: 8.7 %
NEUTROS ABS: 3585 {cells}/uL (ref 1500–7800)
NEUTROS PCT: 39.4 %
Platelets: 175 10*3/uL (ref 140–400)
RBC: 4.84 10*6/uL (ref 4.20–5.80)
RDW: 12.6 % (ref 11.0–15.0)
Total Lymphocyte: 50 %
WBC: 9.1 10*3/uL (ref 3.8–10.8)
WBCMIX: 792 {cells}/uL (ref 200–950)

## 2016-12-26 LAB — VITAMIN D 25 HYDROXY (VIT D DEFICIENCY, FRACTURES): VIT D 25 HYDROXY: 137 ng/mL — AB (ref 30–100)

## 2016-12-26 LAB — LIPID PANEL
CHOL/HDL RATIO: 7.5 (calc) — AB (ref <5.0)
CHOLESTEROL: 179 mg/dL (ref <200)
HDL: 24 mg/dL — ABNORMAL LOW (ref >40)
LDL CHOLESTEROL (CALC): 122 mg/dL — AB
Non-HDL Cholesterol (Calc): 155 mg/dL (calc) — ABNORMAL HIGH (ref <130)
TRIGLYCERIDES: 212 mg/dL — AB (ref <150)

## 2016-12-26 LAB — HEPATIC FUNCTION PANEL
AG RATIO: 1.3 (calc) (ref 1.0–2.5)
ALBUMIN MSPROF: 4.2 g/dL (ref 3.6–5.1)
ALT: 65 U/L — ABNORMAL HIGH (ref 9–46)
AST: 39 U/L — ABNORMAL HIGH (ref 10–35)
Alkaline phosphatase (APISO): 61 U/L (ref 40–115)
BILIRUBIN INDIRECT: 0.4 mg/dL (ref 0.2–1.2)
Bilirubin, Direct: 0.1 mg/dL (ref 0.0–0.2)
GLOBULIN: 3.2 g/dL (ref 1.9–3.7)
TOTAL PROTEIN: 7.4 g/dL (ref 6.1–8.1)
Total Bilirubin: 0.5 mg/dL (ref 0.2–1.2)

## 2016-12-26 LAB — MAGNESIUM: Magnesium: 1.9 mg/dL (ref 1.5–2.5)

## 2016-12-26 LAB — BASIC METABOLIC PANEL WITH GFR
BUN / CREAT RATIO: 27 (calc) — AB (ref 6–22)
BUN: 30 mg/dL — AB (ref 7–25)
CO2: 23 mmol/L (ref 20–32)
CREATININE: 1.1 mg/dL (ref 0.70–1.33)
Calcium: 9.7 mg/dL (ref 8.6–10.3)
Chloride: 103 mmol/L (ref 98–110)
GFR, EST AFRICAN AMERICAN: 85 mL/min/{1.73_m2} (ref >=60)
GFR, Est Non African American: 74 mL/min/{1.73_m2} (ref >=60)
Glucose, Bld: 159 mg/dL — ABNORMAL HIGH (ref 65–99)
Potassium: 4.5 mmol/L (ref 3.5–5.3)
Sodium: 138 mmol/L (ref 135–146)

## 2016-12-26 LAB — INSULIN, RANDOM: Insulin: 125.2 u[IU]/mL — ABNORMAL HIGH (ref 2.0–19.6)

## 2016-12-26 LAB — HEMOGLOBIN A1C
HEMOGLOBIN A1C: 6.7 %{Hb} — AB (ref <5.7)
Mean Plasma Glucose: 146 (calc)
eAG (mmol/L): 8.1 (calc)

## 2016-12-26 LAB — TSH: TSH: 1.58 mIU/L (ref 0.40–4.50)

## 2016-12-26 MED ORDER — PROMETHAZINE-DM 6.25-15 MG/5ML PO SYRP: ORAL_SOLUTION | ORAL | 1 refills | Status: DC

## 2016-12-26 MED ORDER — AZITHROMYCIN 250 MG PO TABS: ORAL_TABLET | ORAL | 1 refills | Status: DC

## 2016-12-26 MED ORDER — ROSUVASTATIN CALCIUM 40 MG PO TABS: ORAL_TABLET | ORAL | 5 refills | Status: DC

## 2016-12-26 MED ORDER — PREDNISONE 20 MG PO TABS: ORAL_TABLET | ORAL | 0 refills | Status: DC

## 2017-01-06 ENCOUNTER — Encounter: Payer: Self-pay | Admitting: Physician Assistant

## 2017-01-06 ENCOUNTER — Ambulatory Visit (HOSPITAL_COMMUNITY)
Admission: RE | Admit: 2017-01-06 | Discharge: 2017-01-06 | Disposition: A | Discharge status: Home / Self Care | Payer: 59 | Source: Ambulatory Visit | Attending: Physician Assistant | Admitting: Physician Assistant

## 2017-01-06 ENCOUNTER — Ambulatory Visit: Payer: 59 | Admitting: Physician Assistant

## 2017-01-06 VITALS — BP 126/72 | HR 79 | Temp 97.5°F | Resp 16 | Ht 76.0 in

## 2017-01-06 DIAGNOSIS — M7989 Other specified soft tissue disorders: Secondary | ICD-10-CM | POA: Insufficient documentation

## 2017-01-06 DIAGNOSIS — L97509 Non-pressure chronic ulcer of other part of unspecified foot with unspecified severity: Secondary | ICD-10-CM | POA: Insufficient documentation

## 2017-01-06 DIAGNOSIS — E11621 Type 2 diabetes mellitus with foot ulcer: Secondary | ICD-10-CM | POA: Diagnosis not present

## 2017-01-06 MED ORDER — LEVOFLOXACIN 500 MG PO TABS: 500.0000 mg | ORAL_TABLET | Freq: Every day | ORAL | 0 refills | Status: DC

## 2017-01-06 NOTE — Patient Instructions (Signed)
Follow up 1-2 weeks Wash with soap and water Keep clean and dry  Try to keep out of foot and breathing when able Take antibiotic with probiotic from over the counter If worse call office Get xray   Skin Abscess A skin abscess is an infected area on or under your skin that contains a collection of pus and other material. An abscess may also be called a furuncle, carbuncle, or boil. An abscess can occur in or on almost any part of your body. Some abscesses break open (rupture) on their own. Most continue to get worse unless they are treated. The infection can spread deeper into the body and eventually into your blood, which can make you feel ill. Treatment usually involves draining the abscess. What are the causes? An abscess occurs when germs, often bacteria, pass through your skin and cause an infection. This may be caused by:  A scrape or cut on your skin.  A puncture wound through your skin, including a needle injection.  Blocked oil or sweat glands.  Blocked and infected hair follicles.  A cyst that forms beneath your skin (sebaceous cyst) and becomes infected.  What increases the risk? This condition is more likely to develop in people who:  Have a weak body defense system (immune system).  Have diabetes.  Have dry and irritated skin.  Get frequent injections or use illegal IV drugs.  Have a foreign body in a wound, such as a splinter.  Have problems with their lymph system or veins.  What are the signs or symptoms? An abscess may start as a painful, firm bump under the skin. Over time, the abscess may get larger or become softer. Pus may appear at the top of the abscess, causing pressure and pain. It may eventually break through the skin and drain. Other symptoms include:  Redness.  Warmth.  Swelling.  Tenderness.  A sore on the skin.  How is this diagnosed? This condition is diagnosed based on your medical history and a physical exam. A sample of pus may be  taken from the abscess to find out what is causing the infection and what antibiotics can be used to treat it. You also may have:  Blood tests to look for signs of infection or spread of an infection to your blood.  Imaging studies such as ultrasound, CT scan, or MRI if the abscess is deep.  How is this treated? Small abscesses that drain on their own may not need treatment. Treatment for an abscess that does not rupture on its own may include:  Warm compresses applied to the area several times per day.  Incision and drainage. Your health care provider will make an incision to open the abscess and will remove pus and any foreign body or dead tissue. The incision area may be packed with gauze to keep it open for a few days while it heals.  Antibiotic medicines to treat infection. For a severe abscess, you may first get antibiotics through an IV and then change to oral antibiotics.  Follow these instructions at home: Abscess Care  If you have an abscess that has not drained, place a warm, clean, wet washcloth over the abscess several times a day. Do this as told by your health care provider.  Follow instructions from your health care provider about how to take care of your abscess. Make sure you: ? Cover the abscess with a bandage (dressing). ? Change your dressing or gauze as told by your health care provider. ?  Wash your hands with soap and water before you change the dressing or gauze. If soap and water are not available, use hand sanitizer.  Check your abscess every day for signs of a worsening infection. Check for: ? More redness, swelling, or pain. ? More fluid or blood. ? Warmth. ? More pus or a bad smell. Medicines  Take over-the-counter and prescription medicines only as told by your health care provider.  If you were prescribed an antibiotic medicine, take it as told by your health care provider. Do not stop taking the antibiotic even if you start to feel better. General  instructions  To avoid spreading the infection: ? Do not share personal care items, towels, or hot tubs with others. ? Avoid making skin contact with other people.  Keep all follow-up visits as told by your health care provider. This is important. Contact a health care provider if:  You have more redness, swelling, or pain around your abscess.  You have more fluid or blood coming from your abscess.  Your abscess feels warm to the touch.  You have more pus or a bad smell coming from your abscess.  You have a fever.  You have muscle aches.  You have chills or a general ill feeling. Get help right away if:  You have severe pain.  You see red streaks on your skin spreading away from the abscess. This information is not intended to replace advice given to you by your health care provider. Make sure you discuss any questions you have with your health care provider. Document Released: 10/17/2004 Document Revised: 09/03/2015 Document Reviewed: 11/16/2014 Elsevier Interactive Patient Education  2018 [employer]  Diabetes and Foot Care Diabetes may cause you to have problems because of poor blood supply (circulation) to your feet and legs. This may cause the skin on your feet to become thinner, break easier, and heal more slowly. Your skin may become dry, and the skin may peel and crack. You may also have nerve damage in your legs and feet causing decreased feeling in them. You may not notice minor injuries to your feet that could lead to infections or more serious problems. Taking care of your feet is one of the most important things you can do for yourself. Follow these instructions at home:  Wear shoes at all times, even in the house. Do not go barefoot. Bare feet are easily injured.  Check your feet daily for blisters, cuts, and redness. If you cannot see the bottom of your feet, use a mirror or ask someone for help.  Wash your feet with warm water (do not use hot water) and mild  soap. Then pat your feet and the areas between your toes until they are completely dry. Do not soak your feet as this can dry your skin.  Apply a moisturizing lotion or petroleum jelly (that does not contain alcohol and is unscented) to the skin on your feet and to dry, brittle toenails. Do not apply lotion between your toes.  Trim your toenails straight across. Do not dig under them or around the cuticle. File the edges of your nails with an emery board or nail file.  Do not cut corns or calluses or try to remove them with medicine.  Wear clean socks or stockings every day. Make sure they are not too tight. Do not wear knee-high stockings since they may decrease blood flow to your legs.  Wear shoes that fit properly and have enough cushioning. To break in new  shoes, wear them for just a few hours a day. This prevents you from injuring your feet. Always look in your shoes before you put them on to be sure there are no objects inside.  Do not cross your legs. This may decrease the blood flow to your feet.  If you find a minor scrape, cut, or break in the skin on your feet, keep it and the skin around it clean and dry. These areas may be cleansed with mild soap and water. Do not cleanse the area with peroxide, alcohol, or iodine.  When you remove an adhesive bandage, be sure not to damage the skin around it.  If you have a wound, look at it several times a day to make sure it is healing.  Do not use heating pads or hot water bottles. They may burn your skin. If you have lost feeling in your feet or legs, you may not know it is happening until it is too late.  Make sure your health care provider performs a complete foot exam at least annually or more often if you have foot problems. Report any cuts, sores, or bruises to your health care provider immediately. Contact a health care provider if:  You have an injury that is not healing.  You have cuts or breaks in the skin.  You have an ingrown  nail.  You notice redness on your legs or feet.  You feel burning or tingling in your legs or feet.  You have pain or cramps in your legs and feet.  Your legs or feet are numb.  Your feet always feel cold. Get help right away if:  There is increasing redness, swelling, or pain in or around a wound.  There is a red line that goes up your leg.  Pus is coming from a wound.  You develop a fever or as directed by your health care provider.  You notice a bad smell coming from an ulcer or wound. This information is not intended to replace advice given to you by your health care provider. Make sure you discuss any questions you have with your health care provider. Document Released: 01/05/2000 Document Revised: 06/15/2015 Document Reviewed: 06/16/2012 Elsevier Interactive Patient Education  2017 [employer].

## 2017-01-06 NOTE — Progress Notes (Signed)
Subjective:    Patient ID: [name], male    DOB: January 27, 1960, 56 y.o.   MRN: [id number]  HPI 56 y.o. obese smoking WM with history of HTN DM2 with neuropathy presents with left foot ulcers. He states in Oct he got a nail in his foot through his shoe/sock, walked on it all day without knowing due to neuropathy, states cleaned it out and had no issues with it. Last Friday after he got off work had large knot/scab on his foot where the nail was on it, when he pulled the scab off of it had thick white/bloody discharge. Was walking on it all day Saturday and Saturday night had a second area next to it. Feels he has some swelling, redness in his foot now.   Blood pressure 126/72, pulse 79, temperature (!) 97.5 F (36.4 C), resp. rate 16, height 6\' 4"  (1.93 m), SpO2 97 %.  Medications Current Outpatient Medications on File Prior to Visit  Medication Sig  . aspirin 81 MG tablet Take 81 mg by mouth daily.  . citalopram (CELEXA) 40 MG tablet TAKE ONE TABLET BY MOUTH ONCE DAILY FOR  MOOD  . hydrochlorothiazide (HYDRODIURIL) 25 MG tablet TAKE 1 TABLET BY MOUTH ONCE DAILY  . lisinopril (PRINIVIL,ZESTRIL) 20 MG tablet TAKE 1 TABLET BY MOUTH ONCE DAILY  . metFORMIN (GLUCOPHAGE XR) 500 MG 24 hr tablet Take 2 tablets 2 x / day for Diabetes  . rosuvastatin (CRESTOR) 40 MG tablet Take 1/2 to 1 tablet daily or as directed for Cholesterol   No current facility-administered medications on file prior to visit.     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   Review of Systems  Constitutional: Negative.  Negative for chills, diaphoresis, fatigue and fever.  HENT: Negative.   Respiratory: Negative.     Cardiovascular: Negative.   Gastrointestinal: Negative.   Genitourinary: Negative.   Musculoskeletal: Negative for arthralgias, back pain, gait problem, joint swelling, myalgias, neck pain and neck stiffness.  Skin: Positive for rash and wound. Negative for color change and pallor.  Neurological: Positive for numbness. Negative for dizziness, tremors, seizures, syncope, facial asymmetry, speech difficulty, weakness, light-headedness and headaches.       Objective:   Physical Exam  Constitutional: He is oriented to person, place, and time. He appears well-developed and well-nourished. No distress.  Obese  Cardiovascular: Normal rate and regular rhythm.  No murmur heard. Pulses:      Dorsalis pedis pulses are 1+ on the right side, and 1+ on the left side.       Posterior tibial pulses are 1+ on the right side, and 1+ on the left side.  Pulmonary/Chest: Effort normal and breath sounds normal. He exhibits no tenderness.  Abdominal: Soft. Bowel sounds are normal. There is no tenderness.  obese  Musculoskeletal: He exhibits no tenderness.  Neurological: He is alert and oriented to person, place, and time. No cranial nerve deficit.  Decreased sensation bilateral feet to shins  Skin: Skin is warm and dry. He is not diaphoretic.  10 mm fluctuant abscess on left mid foot, nontender, no warmth, with superior lateral ulcer/nodule.  I&D of the area preformed: verbal permission: alcohol used to clean, lidocaine used to numb the area, 11 blade used, White thin pus with  blood expressed from the area, area cleaned, dressed and instructions given to patient.           Assessment & Plan:  Left foot ulcer diabetic From nail in Oct?, I&D in the office, levaquin to cover pseudomonas, monitor, get Xray to rule out bone involvement, abscess follow up 1-2 weeks.  He does have palpable pulses, continue bASA, walking, if worsening symptoms will get ABI, monitor feet daily. Advised to quit smoking  The  patient was advised to call immediately if he has any concerning symptoms in the interval. The patient voices understanding of current treatment options and is in agreement with the current care plan.The patient knows to call the clinic with any problems, questions or concerns or go to the ER if any further progression of symptoms.

## 2017-01-07 NOTE — Progress Notes (Signed)
Pt aware of lab results & voiced understanding of those results.

## 2017-01-15 ENCOUNTER — Encounter: Payer: Self-pay | Admitting: Internal Medicine

## 2017-01-15 ENCOUNTER — Ambulatory Visit: Payer: 59 | Admitting: Internal Medicine

## 2017-01-15 VITALS — BP 136/68 | HR 76 | Temp 97.1°F | Resp 18 | Ht 76.0 in | Wt 282.4 lb

## 2017-01-15 DIAGNOSIS — E1149 Type 2 diabetes mellitus with other diabetic neurological complication: Secondary | ICD-10-CM | POA: Diagnosis not present

## 2017-01-15 DIAGNOSIS — L03116 Cellulitis of left lower limb: Secondary | ICD-10-CM

## 2017-01-15 DIAGNOSIS — E1140 Type 2 diabetes mellitus with diabetic neuropathy, unspecified: Secondary | ICD-10-CM | POA: Diagnosis not present

## 2017-01-15 MED ORDER — CEPHALEXIN 500 MG PO CAPS: ORAL_CAPSULE | ORAL | 0 refills | Status: DC

## 2017-01-15 NOTE — Progress Notes (Signed)
  Subjective:    Patient ID: [name], male    DOB: 1960-04-13, 56 y.o.   MRN: [id number]  HPI   This nice 56 yo MWM T2_DM with peripheral sensory neuropathy returns for 10 day f/u treatment w/ Levaquin for a Metatarsal abscess of the Left foot. Patient has no sensation of his feet with a mod severe loss of feeling at the mid shin level. L foot X ray found no evidence of Osteomyelitis.   Medication Sig  . aspirin 81 MG  Take  daily.  . citalopram 40 MG TAKE ONE TAB ONCE DAILY FOR  MOOD  . hydrochlorothiazide 25 MG  TAKE 1 TAB ONCE DAILY  . LEVAQUIN 500 MG Take 1 tab daily.  Marland Kitchen. lisinopril  20 MG TAKE 1 TAB ONCE DAILY  . metFORMIN-XR 500 MG  Take 2 tablets 2 x / day for Diabetes  . rosuvastatin  40 MG Take 1/2 to 1 tablet daily or as directed for Cholesterol   Allergies  Allergen Reactions  . Gabapentin     Pt stated, "Made me have a bad attitude; did not relieve pain"  . Invokana [Canagliflozin]     Extremity edema/caused pain  . Codeine Camsylate [Codeine] Rash  . Morphine And Related Rash   Past Medical History:  Diagnosis Date  . Diabetic neuropathy ([name])   . History of hepatitis C   . History of kidney stones   . Hyperlipidemia   . Hypertension   . Other testicular hypofunction   . Type II or unspecified type diabetes mellitus without mention of complication, not stated as uncontrolled   . Vitamin D deficiency    Review of Systems  10 point systems review negative except as above.    Objective:   Physical Exam  BP 136/68   Pulse 76   Temp (!) 97.1 F (36.2 C)   Resp 18   Ht 6\' 4"  (1.93 m)   Wt 282 lb 6.4 oz (128.1 kg)   BMI 34.37 kg/m   HEENT - WNL. Neck - supple.  Chest - Clear equal BS. Cor - Nl HS. RRR w/o sig MGR. PP - not palp.. No edema. MS- FROM w/o deformities.  Gait Nl. Neuro -  Loss of sensation in a stocking distribution at the mid shin level.  Skin - healed hyperkeratotic area of the L 3rd MTP area and the lateral plantar mid foot w/o  erythema or fluctuance.     Assessment & Plan:   1. Cellulitis of left foot  - will cover emperically for a longer period to avoid relapse.   - cephALEXin (KEFLEX) 500 MG capsule; Take 1 capsule 2 x / day with food for infection  Dispense: 30 capsule; Refill: 0  2. T2_NIDDM w/Peripheral Neuropathy

## 2017-01-15 NOTE — Patient Instructions (Signed)

## 2017-01-27 ENCOUNTER — Encounter: Payer: Self-pay | Admitting: Physician Assistant

## 2017-01-27 ENCOUNTER — Ambulatory Visit: Payer: 59 | Admitting: Physician Assistant

## 2017-01-27 VITALS — BP 130/76 | HR 82 | Temp 97.4°F | Resp 18 | Ht 76.0 in | Wt 279.6 lb

## 2017-01-27 DIAGNOSIS — S90822A Blister (nonthermal), left foot, initial encounter: Secondary | ICD-10-CM | POA: Diagnosis not present

## 2017-01-27 NOTE — Patient Instructions (Signed)
Blisters, Adult A blister is a raised bubble of skin filled with liquid. Blisters often develop in an area of the skin that repeatedly rubs or presses against another surface (friction blister). Friction blisters can occur on any part of the body, but they usually develop on the hands or feet. Long-term pressure on the same area of the skin can also lead to areas of hardened skin (calluses). What are the causes? A blister can be caused by:  An injury.  A burn.  An allergic reaction.  An infection.  Exposure to irritating chemicals.  Friction, especially in an area with a lot of heat and moisture.  Friction blisters often result from:  Sports.  Repetitive activities.  Using tools and doing other activities without wearing gloves.  Shoes that are too tight or too loose.  What are the signs or symptoms? A blister is often round and looks like a bump. It may:  Itch.  Be painful to the touch.  Before a blister forms, the skin may:  Become red.  Feel warm.  Itch.  Be painful to the touch.  How is this diagnosed? A blister is diagnosed with a physical exam. How is this treated? Treatment usually involves protecting the area where the blister has formed until the skin has healed. Other treatments may include:  A bandage (dressing) to cover the blister.  Extra padding around and over the blister, so that it does not rub on anything.  Antibiotic ointment.  1. Most blisters break open, dry up, and go away on their own within 1-2 weeks.   Some blisters may need to be drained by a health care provider. Follow these instructions at home:  Protect the area where the blister has formed as told by your health care provider.  Keep your blister clean and dry. This helps to prevent infection.  Do not pop your blister. This can cause infection.  If you were prescribed an antibiotic, use it as told by your health care provider. Do not stop using the antibiotic even if your  condition improves.  Wear different shoes until the blister heals.  Avoid the activity that caused the blister until your blister heals.  Check your blister every day for signs of infection. Check for: ? More redness, swelling, or pain. ? More fluid or blood. ? Warmth. ? Pus or a bad smell. ? The blister getting better and then getting worse. How is this prevented? Taking these steps can help to prevent blisters that are caused by friction. Make sure you:  Wear comfortable shoes that fit well.  Always wear socks with shoes.  Wear extra socks or use tape, bandages, or pads over blister-prone areas as needed. You may also apply petroleum jelly under bandages in blister-prone areas.  Wear protective gear, such as gloves, when participating in sports or activities that can cause blisters.  Wear loose-fitting, moisture-wicking clothes when participating in sports or activities.  Use powders as needed to keep your feet dry.  Contact a health care provider if:  You have more redness, swelling, or pain around your blister.  You have more fluid or blood coming from your blister.  Your blister feels warm to the touch.  You have pus or a bad smell coming from your blister.  You have a fever or chills.  Your blister gets better and then it gets worse. This information is not intended to replace advice given to you by your health care provider. Make sure you discuss any questions  you have with your health care provider. Document Released: 02/15/2004 Document Revised: 09/06/2015 Document Reviewed: 07/21/2015 Elsevier Interactive Patient Education  2018 [employer]

## 2017-01-27 NOTE — Progress Notes (Signed)
   Subjective:    Patient ID: [name], male    DOB: 1960/08/08, 57 y.o.   MRN: [id number]  HPI 57 y.o. obese smoking WM with history of DM neuropathy presents for foot follow up. He was seen 12/17 and 01/15/2017 for left foot blister and cellulitis. He has finished levaquin, finished keflex but has started with a nontender blister on his left foot. No warmth, no swelling, no redness, no pus.   Blood pressure 130/76, pulse 82, temperature (!) 97.4 F (36.3 C), resp. rate 18, height 6\' 4"  (1.93 m), weight 279 lb 9.6 oz (126.8 kg), SpO2 98 %.  Medications Current Outpatient Medications on File Prior to Visit  Medication Sig  . aspirin 81 MG tablet Take 81 mg by mouth daily.  . cephALEXin (KEFLEX) 500 MG capsule Take 1 capsule 2 x / day with food for infection  . citalopram (CELEXA) 40 MG tablet TAKE ONE TABLET BY MOUTH ONCE DAILY FOR  MOOD  . hydrochlorothiazide (HYDRODIURIL) 25 MG tablet TAKE 1 TABLET BY MOUTH ONCE DAILY  . levofloxacin (LEVAQUIN) 500 MG tablet Take 1 tablet (500 mg total) by mouth daily.  Marland Kitchen. lisinopril (PRINIVIL,ZESTRIL) 20 MG tablet TAKE 1 TABLET BY MOUTH ONCE DAILY  . metFORMIN (GLUCOPHAGE XR) 500 MG 24 hr tablet Take 2 tablets 2 x / day for Diabetes  . rosuvastatin (CRESTOR) 40 MG tablet Take 1/2 to 1 tablet daily or as directed for Cholesterol   No current facility-administered medications on file prior to visit.     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   Review of Systems See above    Objective:   Physical Exam  Left foot with no redness, warmth, with 7cm blister. Decreased sensation bilateral feet to shins, 1+ pulses DP and PT.        Assessment &  Plan:    Blister of plantar aspect of left foot, initial encounter Keep area clean/wrapped, if any signs of infection call the office

## 2017-01-30 ENCOUNTER — Other Ambulatory Visit: Payer: Self-pay | Admitting: Internal Medicine

## 2017-04-03 ENCOUNTER — Ambulatory Visit: Payer: 59 | Admitting: Internal Medicine

## 2017-04-03 VITALS — BP 126/74 | HR 80 | Temp 97.7°F | Resp 18 | Ht 74.75 in | Wt 279.2 lb

## 2017-04-03 DIAGNOSIS — Z8249 Family history of ischemic heart disease and other diseases of the circulatory system: Secondary | ICD-10-CM | POA: Diagnosis not present

## 2017-04-03 DIAGNOSIS — Z111 Encounter for screening for respiratory tuberculosis: Secondary | ICD-10-CM

## 2017-04-03 DIAGNOSIS — Z0001 Encounter for general adult medical examination with abnormal findings: Secondary | ICD-10-CM

## 2017-04-03 DIAGNOSIS — I10 Essential (primary) hypertension: Secondary | ICD-10-CM | POA: Diagnosis not present

## 2017-04-03 DIAGNOSIS — N182 Chronic kidney disease, stage 2 (mild): Secondary | ICD-10-CM | POA: Diagnosis not present

## 2017-04-03 DIAGNOSIS — E782 Mixed hyperlipidemia: Secondary | ICD-10-CM

## 2017-04-03 DIAGNOSIS — R5383 Other fatigue: Secondary | ICD-10-CM

## 2017-04-03 DIAGNOSIS — E559 Vitamin D deficiency, unspecified: Secondary | ICD-10-CM

## 2017-04-03 DIAGNOSIS — Z1211 Encounter for screening for malignant neoplasm of colon: Secondary | ICD-10-CM

## 2017-04-03 DIAGNOSIS — E1122 Type 2 diabetes mellitus with diabetic chronic kidney disease: Secondary | ICD-10-CM

## 2017-04-03 DIAGNOSIS — F17200 Nicotine dependence, unspecified, uncomplicated: Secondary | ICD-10-CM

## 2017-04-03 DIAGNOSIS — E1140 Type 2 diabetes mellitus with diabetic neuropathy, unspecified: Secondary | ICD-10-CM | POA: Diagnosis not present

## 2017-04-03 DIAGNOSIS — J449 Chronic obstructive pulmonary disease, unspecified: Secondary | ICD-10-CM

## 2017-04-03 DIAGNOSIS — Z125 Encounter for screening for malignant neoplasm of prostate: Secondary | ICD-10-CM

## 2017-04-03 DIAGNOSIS — E1149 Type 2 diabetes mellitus with other diabetic neurological complication: Secondary | ICD-10-CM

## 2017-04-03 DIAGNOSIS — Z79899 Other long term (current) drug therapy: Secondary | ICD-10-CM

## 2017-04-03 DIAGNOSIS — Z136 Encounter for screening for cardiovascular disorders: Secondary | ICD-10-CM

## 2017-04-03 DIAGNOSIS — Z1212 Encounter for screening for malignant neoplasm of rectum: Secondary | ICD-10-CM

## 2017-04-03 NOTE — Progress Notes (Signed)
[facility]   Lucky Cowboy, M.D.     Dyanne Carrel. Steffanie Dunn, P.A.-C Judd Gaudier, DNP [facility]                [address]                [place], [place]. [address] Telephone [phone] Telefax [phone] Annual  Screening/Preventative Visit  & Comprehensive Evaluation & Examination     This very nice 57 y.o. DWM  presents for a Screening/Preventative Visit & comprehensive evaluation and management of multiple medical co-morbidities.  Patient has been followed for HTN, HLD, T2_DM and Vitamin D Deficiency.     HTN predates since 14. Patient's BP has been controlled at home.  Today's BP is at goal -  126/74. Patient denies any cardiac symptoms as chest pain, palpitations, shortness of breath, dizziness or ankle swelling.     Patient's hyperlipidemia is controlled with diet and medications. Patient denies myalgias or other medication SE's. Last lipids were  Lab Results  Component Value Date   CHOL 179 12/25/2016   HDL 24 (L) 12/25/2016   LDLCALC 122 (H) 12/25/2016   TRIG 212 (H) 12/25/2016   CHOLHDL 7.5 (H) 12/25/2016      Patient has Morbid Obesity (BMI 35+) and T2_DM (2008)  and patient denies reactive hypoglycemic symptoms, visual blurring, diabetic polys or paresthesias. Last A1c was not at goal: Lab Results  Component Value Date   HGBA1C 6.7 (H) 12/25/2016       Finally, patient has history of Vitamin D Deficiency ("24"/2008) and last vitamin D was elevated and dose was decreased: Lab Results  Component Value Date   VD25OH 137 (H) 12/25/2016   Current Outpatient Medications on File Prior to Visit  Medication Sig  . aspirin 81 MG tablet Take 81 mg by mouth daily.  . citalopram (CELEXA) 40 MG tablet TAKE ONE TABLET BY MOUTH ONCE DAILY FOR  MOOD  . hydrochlorothiazide (HYDRODIURIL) 25 MG tablet TAKE 1 TABLET BY MOUTH ONCE DAILY  . lisinopril (PRINIVIL,ZESTRIL) 20 MG tablet TAKE 1 TABLET BY MOUTH ONCE  DAILY  . metFORMIN (GLUCOPHAGE XR) 500 MG 24 hr tablet Take 2 tablets 2 x / day for Diabetes  . rosuvastatin (CRESTOR) 40 MG tablet Take 1/2 to 1 tablet daily or as directed for Cholesterol (Patient not taking: Reported on 04/03/2017)   No current facility-administered medications on file prior to visit.    Allergies  Allergen Reactions  . Gabapentin     Pt stated, "Made me have a bad attitude; did not relieve pain"  . Invokana [Canagliflozin]     Extremity edema/caused pain  . Codeine Camsylate [Codeine] Rash  . Morphine And Related Rash   Past Medical History:  Diagnosis Date  . Diabetic neuropathy ([name])   . History of hepatitis C   . History of kidney stones   . Hyperlipidemia   . Hypertension   . Other testicular hypofunction   . Type II or unspecified type diabetes mellitus without mention of complication, not stated as uncontrolled   . Vitamin D deficiency    Health Maintenance  Topic Date Due  . OPHTHALMOLOGY EXAM  11/05/1968  . COLONOSCOPY  11/05/2008  . FOOT EXAM  12/03/2016  . INFLUENZA VACCINE  08/27/2017 (Originally 08/21/2016)  . PNEUMOCOCCAL POLYSACCHARIDE VACCINE (1) 12/25/2017 (Originally 11/05/1960)  . HEMOGLOBIN A1C  06/25/2017  . TETANUS/TDAP  12/26/2026  . Hepatitis C Screening  Completed  . HIV Screening  Completed   Immunization History  Administered Date(s) Administered  . PPD Test 08/31/2013, 11/08/2014  . Td 01/21/2005  . Tdap 12/25/2016   Last Colon -  Past Surgical History:  Procedure Laterality Date  . CHOLECYSTECTOMY  1987  . ORIF TIBIA FRACTURE     Family History  Problem Relation Age of Onset  . Hypertension Mother   . Cancer Father        colon  . Alzheimer's disease Father   . Stroke Father    Social History   Socioeconomic History  . Marital status: Married    Spouse name: Not on file  . Number of children: Not on file  Occupational History  . Not on file  Tobacco Use  . Smoking status: Current Every Day Smoker     Packs/day: 0.75    Types: Cigarettes    Last attempt to quit: 11/10/2015    Years since quitting: 1.3  . Smokeless tobacco: Never Used  Substance and Sexual Activity  . Alcohol use: No  . Drug use: Yes    Types: Marijuana  . Sexual activity: Not on file    ROS Constitutional: Denies fever, chills, weight loss/gain, headaches, insomnia,  night sweats or change in appetite. Does c/o fatigue. Eyes: Denies redness, blurred vision, diplopia, discharge, itchy or watery eyes.  ENT: Denies discharge, congestion, post nasal drip, epistaxis, sore throat, earache, hearing loss, dental pain, Tinnitus, Vertigo, Sinus pain or snoring.  Cardio: Denies chest pain, palpitations, irregular heartbeat, syncope, dyspnea, diaphoresis, orthopnea, PND, claudication or edema Respiratory: denies cough, dyspnea, DOE, pleurisy, hoarseness, laryngitis or wheezing.  Gastrointestinal: Denies dysphagia, heartburn, reflux, water brash, pain, cramps, nausea, vomiting, bloating, diarrhea, constipation, hematemesis, melena, hematochezia, jaundice or hemorrhoids Genitourinary: Denies dysuria, frequency, urgency, nocturia, hesitancy, discharge, hematuria or flank pain Musculoskeletal: Denies arthralgia, myalgia, stiffness, Jt. Swelling, pain, limp or strain/sprain. Denies Falls. Skin: Denies puritis, rash, hives, warts, acne, eczema or change in skin lesion Neuro: No weakness, tremor, incoordination, spasms, paresthesia or pain Psychiatric: Denies confusion, memory loss or sensory loss. Denies Depression. Endocrine: Denies change in weight, skin, hair change, nocturia, and paresthesia, diabetic polys, visual blurring or hyper / hypo glycemic episodes.  Heme/Lymph: No excessive bleeding, bruising or enlarged lymph nodes.  Physical Exam  BP 126/74   Pulse 80   Temp 97.7 F (36.5 C)   Resp 18   Ht 6' 2.75" (1.899 m)   Wt 279 lb 3.2 oz (126.6 kg)   BMI 35.13 kg/m   General Appearance: Well nourished and well groomed and  in no apparent distress.  Eyes: PERRLA, EOMs, conjunctiva no swelling or erythema, normal fundi and vessels. Sinuses: No frontal/maxillary tenderness ENT/Mouth: EACs patent / TMs  nl. Nares clear without erythema, swelling, mucoid exudates. Oral hygiene is good. No erythema, swelling, or exudate. Tongue normal, non-obstructing. Tonsils not swollen or erythematous. Hearing normal.  Neck: Supple, thyroid not palpable. No bruits, nodes or JVD. Respiratory: Respiratory effort normal.  BS equal and clear bilateral without rales, rhonci, wheezing or stridor. Cardio: Heart sounds are normal with regular rate and rhythm and no murmurs, rubs or gallops. Peripheral pulses are normal and equal bilaterally without edema. No aortic or femoral bruits. Chest: symmetric with normal excursions and percussion.  Abdomen: Soft, with Nl bowel sounds. Nontender, no guarding, rebound, hernias, masses, or organomegaly.  Lymphatics: Non tender without lymphadenopathy.  Genitourinary: No hernias.Testes nl. DRE - prostate nl for age - smooth & firm w/o nodules. Musculoskeletal: Full ROM all peripheral extremities, joint stability,  5/5 strength, and normal gait. Skin: Warm and dry without rashes, lesions, cyanosis, clubbing or  ecchymosis.  Neuro: Cranial nerves intact, reflexes equal bilaterally. Normal muscle tone, no cerebellar symptoms. Sensation decreased in a stocking manner to touch, vibratory and Monofilament from the knees to the toes bilaterally. Pysch: Alert and oriented X 3 with normal affect, insight and judgment appropriate.   Assessment and Plan  1. Annual Preventative/Screening Exam    2. Essential hypertension  - EKG 12-Lead - [place], RETROPERITNL ABD,  LTD - Urinalysis, Routine w reflex microscopic - Microalbumin / creatinine urine ratio - CBC with Differential/Platelet - BASIC METABOLIC PANEL WITH GFR - Magnesium - TSH  3. Hyperlipidemia, mixed  - EKG 12-Lead - [place], RETROPERITNL ABD,  LTD -  Hepatic function panel - Lipid panel - TSH  4. Type 2 diabetes mellitus with stage 2 chronic kidney disease, without long-term current use of insulin ([name])  - EKG 12-Lead - [place], RETROPERITNL ABD,  LTD - Urinalysis, Routine w reflex microscopic - Microalbumin / creatinine urine ratio - BASIC METABOLIC PANEL WITH GFR - Hemoglobin A1c - Insulin, random  5. Vitamin D deficiency  - VITAMIN D 25 Hydroxyl  6. T2_NIDDM w/Peripheral Neuropathy  - HM DIABETES FOOT EXAM - LOW EXTREMITY NEUR EXAM DOCUM  7. Chronic obstructive pulmonary disease  ([name])   8. Morbid obesity (BMI 35)  - Encouraged better food choices  9. Tobacco use disorder  - discourage smoking  10. Screening for colorectal cancer  - POC Hemoccult Bld/Stl   11. Prostate cancer screening  - PSA  12. Screening examination for pulmonary tuberculosis  13. Screening for ischemic heart disease  - EKG 12-Lead - PPD  14. Screening for AAA (aortic abdominal aneurysm)  - [place], RETROPERITNL ABD,  LTD  15. Fatigue, unspecified type  - Iron,Total/Total Iron Binding Cap - Vitamin B12 - Testosterone  16. Medication management  - Urinalysis, Routine w reflex microscopic - Microalbumin / creatinine urine ratio - CBC with Differential/Platelet - BASIC METABOLIC PANEL WITH GFR - Hepatic function panel - Magnesium - Lipid panel - TSH - Hemoglobin A1c - Insulin, random - VITAMIN D 25 Hydroxyl  17. Smoker  - EKG 12-Lead - [place], RETROPERITNL ABD,  LTD  18. FH: cardiovascular disease  - EKG 12-Lead - [place], RETROPERITNL ABD,  LTD         Patient was counseled in prudent diet, weight control to achieve/maintain BMI less than 25, BP monitoring, regular exercise and medications as discussed.  Discussed med effects and SE's. Routine screening labs and tests as requested with regular follow-up as recommended. Over 40 minutes of exam, counseling, chart review and high complex critical decision making was performed

## 2017-04-03 NOTE — Patient Instructions (Signed)

## 2017-04-04 ENCOUNTER — Encounter: Payer: Self-pay | Admitting: *Deleted

## 2017-04-04 LAB — BASIC METABOLIC PANEL WITH GFR
BUN: 18 mg/dL (ref 7–25)
CO2: 26 mmol/L (ref 20–32)
CREATININE: 0.88 mg/dL (ref 0.70–1.33)
Calcium: 9.3 mg/dL (ref 8.6–10.3)
Chloride: 100 mmol/L (ref 98–110)
GFR, EST NON AFRICAN AMERICAN: 95 mL/min/{1.73_m2} (ref >=60)
GFR, Est African American: 110 mL/min/{1.73_m2} (ref >=60)
GLUCOSE: 184 mg/dL — AB (ref 65–99)
Potassium: 4.3 mmol/L (ref 3.5–5.3)
Sodium: 138 mmol/L (ref 135–146)

## 2017-04-04 LAB — URINALYSIS, ROUTINE W REFLEX MICROSCOPIC
Bilirubin Urine: NEGATIVE
Hgb urine dipstick: NEGATIVE
Ketones, ur: NEGATIVE
LEUKOCYTES UA: NEGATIVE
NITRITE: NEGATIVE
PROTEIN: NEGATIVE
SPECIFIC GRAVITY, URINE: 1.023 (ref 1.001–1.03)
pH: <=5 (ref 5.0–8.0)

## 2017-04-04 LAB — HEMOGLOBIN A1C
Hgb A1c MFr Bld: 7 % of total Hgb — ABNORMAL HIGH (ref <5.7)
MEAN PLASMA GLUCOSE: 154 (calc)
eAG (mmol/L): 8.5 (calc)

## 2017-04-04 LAB — MICROALBUMIN / CREATININE URINE RATIO
CREATININE, URINE: 141 mg/dL (ref 20–320)
MICROALB UR: 3.4 mg/dL
MICROALB/CREAT RATIO: 24 ug/mg{creat} (ref <30)

## 2017-04-04 LAB — MAGNESIUM: Magnesium: 1.9 mg/dL (ref 1.5–2.5)

## 2017-04-04 LAB — VITAMIN D 25 HYDROXY (VIT D DEFICIENCY, FRACTURES): Vit D, 25-Hydroxy: 86 ng/mL (ref 30–100)

## 2017-04-04 LAB — CBC WITH DIFFERENTIAL/PLATELET
BASOS ABS: 66 {cells}/uL (ref 0–200)
BASOS PCT: 0.7 %
EOS ABS: 169 {cells}/uL (ref 15–500)
Eosinophils Relative: 1.8 %
HEMATOCRIT: 43.5 % (ref 38.5–50.0)
HEMOGLOBIN: 15.7 g/dL (ref 13.2–17.1)
LYMPHS ABS: 3469 {cells}/uL (ref 850–3900)
MCH: 33.5 pg — AB (ref 27.0–33.0)
MCHC: 36.1 g/dL — AB (ref 32.0–36.0)
MCV: 92.8 fL (ref 80.0–100.0)
MONOS PCT: 8 %
MPV: 11.9 fL (ref 7.5–12.5)
NEUTROS ABS: 4944 {cells}/uL (ref 1500–7800)
Neutrophils Relative %: 52.6 %
Platelets: 206 10*3/uL (ref 140–400)
RBC: 4.69 10*6/uL (ref 4.20–5.80)
RDW: 12.8 % (ref 11.0–15.0)
Total Lymphocyte: 36.9 %
WBC mixed population: 752 cells/uL (ref 200–950)
WBC: 9.4 10*3/uL (ref 3.8–10.8)

## 2017-04-04 LAB — HEPATIC FUNCTION PANEL
AG RATIO: 1.6 (calc) (ref 1.0–2.5)
ALT: 66 U/L — ABNORMAL HIGH (ref 9–46)
AST: 42 U/L — ABNORMAL HIGH (ref 10–35)
Albumin: 4.1 g/dL (ref 3.6–5.1)
Alkaline phosphatase (APISO): 58 U/L (ref 40–115)
BILIRUBIN INDIRECT: 0.4 mg/dL (ref 0.2–1.2)
Bilirubin, Direct: 0.1 mg/dL (ref 0.0–0.2)
GLOBULIN: 2.5 g/dL (ref 1.9–3.7)
TOTAL PROTEIN: 6.6 g/dL (ref 6.1–8.1)
Total Bilirubin: 0.5 mg/dL (ref 0.2–1.2)

## 2017-04-04 LAB — LIPID PANEL
CHOL/HDL RATIO: 7.2 (calc) — AB (ref <5.0)
CHOLESTEROL: 195 mg/dL (ref <200)
HDL: 27 mg/dL — ABNORMAL LOW (ref >40)
LDL CHOLESTEROL (CALC): 118 mg/dL — AB
Non-HDL Cholesterol (Calc): 168 mg/dL (calc) — ABNORMAL HIGH (ref <130)
Triglycerides: 352 mg/dL — ABNORMAL HIGH (ref <150)

## 2017-04-04 LAB — TSH: TSH: 1.59 mIU/L (ref 0.40–4.50)

## 2017-04-04 LAB — VITAMIN B12: Vitamin B-12: 442 pg/mL (ref 200–1100)

## 2017-04-04 LAB — IRON, TOTAL/TOTAL IRON BINDING CAP
%SAT: 39 % (calc) (ref 15–60)
IRON: 112 ug/dL (ref 50–180)
TIBC: 289 ug/dL (ref 250–425)

## 2017-04-04 LAB — TESTOSTERONE: Testosterone: 60 ng/dL — ABNORMAL LOW (ref 250–827)

## 2017-04-04 LAB — PSA: PSA: 1.2 ng/mL (ref <=4.0)

## 2017-04-04 LAB — INSULIN, RANDOM: Insulin: 67.4 u[IU]/mL — ABNORMAL HIGH (ref 2.0–19.6)

## 2017-04-05 ENCOUNTER — Encounter: Payer: Self-pay | Admitting: Internal Medicine

## 2017-04-17 ENCOUNTER — Other Ambulatory Visit: Payer: Self-pay | Admitting: Internal Medicine

## 2017-04-17 DIAGNOSIS — N182 Chronic kidney disease, stage 2 (mild): Principal | ICD-10-CM

## 2017-04-17 DIAGNOSIS — E1122 Type 2 diabetes mellitus with diabetic chronic kidney disease: Secondary | ICD-10-CM

## 2017-05-29 ENCOUNTER — Other Ambulatory Visit: Payer: Self-pay | Admitting: Physician Assistant

## 2017-05-29 ENCOUNTER — Other Ambulatory Visit: Payer: Self-pay | Admitting: Internal Medicine

## 2017-05-29 DIAGNOSIS — I10 Essential (primary) hypertension: Secondary | ICD-10-CM

## 2017-07-07 NOTE — Progress Notes (Signed)
FOLLOW UP  Assessment and Plan:   Hypertension Well controlled with current medications  Monitor blood pressure at home; patient to call if consistently greater than 130/80 Continue DASH diet.   Reminder to go to the ER if any CP, SOB, nausea, dizziness, severe HA, changes vision/speech, left arm numbness and tingling and jaw pain.  Cholesterol Currently above goal of LDL <70; enphasized need to start cholesterol medication - rosu vastatin 20 mg - will start with every other day as has not previously been on statins Continue low cholesterol diet and exercise.  Check lipid panel.   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  Perform daily foot/skin check, notify office of any concerning changes.  Check A1C  Obesity with co morbidities Long discussion about weight loss, diet, and exercise Recommended diet heavy in fruits and veggies and low in animal meats, cheeses, and dairy products, appropriate calorie intake Discussed ideal weight for height Will follow up in 3 months  Vitamin D Def At goal at last visit; continue supplementation to maintain goal of 70-100 Defer Vit D level  Tobacco use Discussed risks associated with tobacco use and advised to reduce or quit Patient is not ready to do so, but advised to consider strongly Will follow up at the next visit  Insomnia - good sleep hygiene discussed, increase day time activity, try melatonin or benadryl if this does not help we will call in sleep medication.     Continue diet and meds as discussed. Further disposition pending results of labs. Discussed med's effects and SE's.   Over 30 minutes of exam, counseling, chart review, and critical decision making was performed.   Future Appointments  Date Time Provider Department Center  10/08/2017  4:30 PM Lucky Cowboy, MD [facility] None  05/13/2018  3:00 PM Lucky Cowboy, MD  [facility] None    ----------------------------------------------------------------------------------------------------------------------  HPI 57 y.o. male  presents for 3 month follow up on hypertension, cholesterol, diabetes, weight and vitamin D deficiency. He reports difficulty sleeping at night, often doesn't fall asleep until 2 am. Discussed at length. Has had some success with an unknown OTC agent. Denies frequent urination at night, denies ETOH use, excessive caffeine late in the day.   he currently continues to smoke 1 pack a day; discussed risks associated with smoking, patient is not ready to quit.   BMI is Body mass index is 34.48 kg/m., he has been working on diet and exercise. Does watch portions, walks around his office building on 10 min breaks Wt Readings from Last 3 Encounters:  07/08/17 274 lb (124.3 kg)  04/03/17 279 lb 3.2 oz (126.6 kg)  01/27/17 279 lb 9.6 oz (126.8 kg)   His blood pressure has been controlled at home, today their BP is BP: 116/74  He does not workout. He denies chest pain, shortness of breath, dizziness.   He is on cholesterol medication (has been prescribed crestor 20 mg daily, but hasn't been taking)  and denies myalgias. His cholesterol is not at goal. The cholesterol last visit was:   Lab Results  Component Value Date   CHOL 195 04/03/2017   HDL 27 (L) 04/03/2017   LDLCALC 118 (H) 04/03/2017   TRIG 352 (H) 04/03/2017   CHOLHDL 7.2 (H) 04/03/2017    He has been working on diet and exercise for T2 diabetes (on metformin 500 mg x 4 daily), and denies foot ulcerations, increased appetite, nausea, polydipsia, polyuria, visual disturbances, vomiting  and weight loss. He does not currently check sugars. Last A1C in the office was:  Lab Results  Component Value Date   HGBA1C 7.0 (H) 04/03/2017   Patient is on Vitamin D supplement and at goal at recent check:    Lab Results  Component Value Date   VD25OH 86 04/03/2017        Current  Medications:  Current Outpatient Medications on File Prior to Visit  Medication Sig  . aspirin 81 MG tablet Take 81 mg by mouth daily.  . citalopram (CELEXA) 40 MG tablet TAKE ONE TABLET BY MOUTH ONCE DAILY FOR  MOOD  . hydrochlorothiazide (HYDRODIURIL) 25 MG tablet TAKE 1 TABLET BY MOUTH ONCE DAILY  . lisinopril (PRINIVIL,ZESTRIL) 20 MG tablet TAKE 1 TABLET BY MOUTH ONCE DAILY  . metFORMIN (GLUCOPHAGE-XR) 500 MG 24 hr tablet TAKE 2 TABLETS BY MOUTH TWICE DAILY FOR  DIABETES  . rosuvastatin (CRESTOR) 40 MG tablet Take 1/2 to 1 tablet daily or as directed for Cholesterol (Patient not taking: Reported on 04/03/2017)   No current facility-administered medications on file prior to visit.      Allergies:  Allergies  Allergen Reactions  . Gabapentin     Pt stated, "Made me have a bad attitude; did not relieve pain"  . Invokana [Canagliflozin]     Extremity edema/caused pain  . Codeine Camsylate [Codeine] Rash  . Morphine And Related Rash     Medical History:  Past Medical History:  Diagnosis Date  . Diabetic neuropathy ([name])   . History of hepatitis C   . History of kidney stones   . Hyperlipidemia   . Hypertension   . Other testicular hypofunction   . Type II or unspecified type diabetes mellitus without mention of complication, not stated as uncontrolled   . Vitamin D deficiency    Family history- Reviewed and unchanged Social history- Reviewed and unchanged   Review of Systems:  Review of Systems  Constitutional: Negative for malaise/fatigue and weight loss.  HENT: Negative for hearing loss and tinnitus.   Eyes: Negative for blurred vision and double vision.  Respiratory: Negative for cough, shortness of breath and wheezing.   Cardiovascular: Negative for chest pain, palpitations, orthopnea, claudication and leg swelling.  Gastrointestinal: Negative for abdominal pain, blood in stool, constipation, diarrhea, heartburn, melena, nausea and vomiting.  Genitourinary:  Negative.   Musculoskeletal: Negative for joint pain and myalgias.  Skin: Negative for rash.  Neurological: Negative for dizziness, tingling, sensory change, weakness and headaches.  Endo/Heme/Allergies: Negative for polydipsia.  Psychiatric/Behavioral: Negative for depression, hallucinations, memory loss and substance abuse. The patient has insomnia. The patient is not nervous/anxious.   All other systems reviewed and are negative.     Physical Exam: BP 116/74   Pulse 80   Temp 97.9 F (36.6 C)   Wt 274 lb (124.3 kg)   SpO2 97%   BMI 34.48 kg/m  Wt Readings from Last 3 Encounters:  07/08/17 274 lb (124.3 kg)  04/03/17 279 lb 3.2 oz (126.6 kg)  01/27/17 279 lb 9.6 oz (126.8 kg)   General Appearance: Well nourished, in no apparent distress. Eyes: PERRLA, EOMs, conjunctiva no swelling or erythema Sinuses: No Frontal/maxillary tenderness ENT/Mouth: Ext aud canals clear, TMs without erythema, bulging. No erythema, swelling, or exudate on post pharynx.  Tonsils not swollen or erythematous. Hearing normal.  Neck: Supple, thyroid normal.  Respiratory: Respiratory effort normal, BS equal bilaterally without rales, rhonchi, wheezing or stridor.  Cardio: RRR with no MRGs. Brisk peripheral  pulses without edema.  Abdomen: Soft, + BS.  Non tender, no guarding, rebound, hernias, masses. Lymphatics: Non tender without lymphadenopathy.  Musculoskeletal: Full ROM, 5/5 strength, Normal gait Skin: Warm, dry without rashes, lesions, ecchymosis.  Neuro: Cranial nerves intact. No cerebellar symptoms.  Psych: Awake and oriented X 3, normal affect, Insight and Judgment appropriate.    Dan Maker, NP 4:54 PM [facility]

## 2017-07-08 ENCOUNTER — Ambulatory Visit: Payer: 59 | Admitting: Adult Health

## 2017-07-08 ENCOUNTER — Encounter: Payer: Self-pay | Admitting: Adult Health

## 2017-07-08 VITALS — BP 116/74 | HR 80 | Temp 97.9°F | Wt 274.0 lb

## 2017-07-08 DIAGNOSIS — E1122 Type 2 diabetes mellitus with diabetic chronic kidney disease: Secondary | ICD-10-CM | POA: Diagnosis not present

## 2017-07-08 DIAGNOSIS — G4700 Insomnia, unspecified: Secondary | ICD-10-CM | POA: Diagnosis not present

## 2017-07-08 DIAGNOSIS — Z79899 Other long term (current) drug therapy: Secondary | ICD-10-CM

## 2017-07-08 DIAGNOSIS — N182 Chronic kidney disease, stage 2 (mild): Secondary | ICD-10-CM

## 2017-07-08 DIAGNOSIS — E559 Vitamin D deficiency, unspecified: Secondary | ICD-10-CM | POA: Diagnosis not present

## 2017-07-08 DIAGNOSIS — F17200 Nicotine dependence, unspecified, uncomplicated: Secondary | ICD-10-CM

## 2017-07-08 DIAGNOSIS — I10 Essential (primary) hypertension: Secondary | ICD-10-CM | POA: Diagnosis not present

## 2017-07-08 DIAGNOSIS — E782 Mixed hyperlipidemia: Secondary | ICD-10-CM

## 2017-07-08 NOTE — Patient Instructions (Addendum)
Aim for 7+ servings of fruits and vegetables daily  80+ fluid ounces of water or unsweet tea for healthy kidneys  Limit alcohol, avoid smoking  Limit animal fats in diet for cholesterol and heart health - choose grass fed whenever available  Aim for low stress - take time to unwind and care for your mental health  Aim for 150 min of moderate intensity exercise weekly for heart health, and weights twice weekly for bone health  Aim for 7-9 hours of sleep daily      When it comes to diets, agreement about the perfect plan isn't easy to find, even among the experts. Experts at the [facility] of [employer] developed an idea known as the Healthy Eating Plate. Just imagine a plate divided into logical, healthy portions.  The emphasis is on diet quality:  Load up on vegetables and fruits - one-half of your plate: Aim for color and variety, and remember that potatoes don't count.  Go for whole grains - one-quarter of your plate: Whole wheat, barley, wheat berries, quinoa, oats, brown rice, and foods made with them. If you want pasta, go with whole wheat pasta.  Protein power - one-quarter of your plate: Fish, chicken, beans, and nuts are all healthy, versatile protein sources. Limit red meat.  The diet, however, does go beyond the plate, offering a few other suggestions.  Use healthy plant oils, such as olive, canola, soy, corn, sunflower and peanut. Check the labels, and avoid partially hydrogenated oil, which have unhealthy trans fats.  If you're thirsty, drink water. Coffee and tea are good in moderation, but skip sugary drinks and limit milk and dairy products to one or two daily servings.  The type of carbohydrate in the diet is more important than the amount. Some sources of carbohydrates, such as vegetables, fruits, whole grains, and beans-are healthier than others.  Finally, stay active.     Can try melatonin 5mg -15 mg at night for sleep, can also do benadryl 25-50mg   at night for sleep.  If this does not help we can try prescription medication.  Also here is some information about good sleep hygiene.   Insomnia Insomnia is frequent trouble falling and/or staying asleep. Insomnia can be a long term problem or a short term problem. Both are common. Insomnia can be a short term problem when the wakefulness is related to a certain stress or worry. Long term insomnia is often related to ongoing stress during waking hours and/or poor sleeping habits. Overtime, sleep deprivation itself can make the problem worse. Every little thing feels more severe because you are overtired and your ability to cope is decreased. CAUSES   Stress, anxiety, and depression.  Poor sleeping habits.  Distractions such as TV in the bedroom.  Naps close to bedtime.  Engaging in emotionally charged conversations before bed.  Technical reading before sleep.  Alcohol and other sedatives. They may make the problem worse. They can hurt normal sleep patterns and normal dream activity.  Stimulants such as caffeine for several hours prior to bedtime.  Pain syndromes and shortness of breath can cause insomnia.  Exercise late at night.  Changing time zones may cause sleeping problems (jet lag). It is sometimes helpful to have someone observe your sleeping patterns. They should look for periods of not breathing during the night (sleep apnea). They should also look to see how long those periods last. If you live alone or observers are uncertain, you can also be observed at a sleep clinic  where your sleep patterns will be professionally monitored. Sleep apnea requires a checkup and treatment. Give your caregivers your medical history. Give your caregivers observations your family has made about your sleep.  SYMPTOMS   Not feeling rested in the morning.  Anxiety and restlessness at bedtime.  Difficulty falling and staying asleep. TREATMENT   Your caregiver may prescribe treatment for  an underlying medical disorders. Your caregiver can give advice or help if you are using alcohol or other drugs for self-medication. Treatment of underlying problems will usually eliminate insomnia problems.  Medications can be prescribed for short time use. They are generally not recommended for lengthy use.  Over-the-counter sleep medicines are not recommended for lengthy use. They can be habit forming.  You can promote easier sleeping by making lifestyle changes such as:  Using relaxation techniques that help with breathing and reduce muscle tension.  Exercising earlier in the day.  Changing your diet and the time of your last meal. No night time snacks.  Establish a regular time to go to bed.  Counseling can help with stressful problems and worry.  Soothing music and white noise may be helpful if there are background noises you cannot remove.  Stop tedious detailed work at least one hour before bedtime. HOME CARE INSTRUCTIONS   Keep a diary. Inform your caregiver about your progress. This includes any medication side effects. See your caregiver regularly. Take note of:  Times when you are asleep.  Times when you are awake during the night.  The quality of your sleep.  How you feel the next day. This information will help your caregiver care for you.  Get out of bed if you are still awake after 15 minutes. Read or do some quiet activity. Keep the lights down. Wait until you feel sleepy and go back to bed.  Keep regular sleeping and waking hours. Avoid naps.  Exercise regularly.  Avoid distractions at bedtime. Distractions include watching television or engaging in any intense or detailed activity like attempting to balance the household checkbook.  Develop a bedtime ritual. Keep a familiar routine of bathing, brushing your teeth, climbing into bed at the same time each night, listening to soothing music. Routines increase the success of falling to sleep faster.  Use  relaxation techniques. This can be using breathing and muscle tension release routines. It can also include visualizing peaceful scenes. You can also help control troubling or intruding thoughts by keeping your mind occupied with boring or repetitive thoughts like the old concept of counting sheep. You can make it more creative like imagining planting one beautiful flower after another in your backyard garden.  During your day, work to eliminate stress. When this is not possible use some of the previous suggestions to help reduce the anxiety that accompanies stressful situations. MAKE SURE YOU:   Understand these instructions.  Will watch your condition.  Will get help right away if you are not doing well or get worse. Document Released: 01/05/2000 Document Revised: 04/01/2011 Document Reviewed: 02/04/2007 [facility] Patient Information 2015 [employer], [place]. This information is not intended to replace advice given to you by your health care provider. Make sure you discuss any questions you have with your health care provider.

## 2017-08-25 ENCOUNTER — Other Ambulatory Visit: Payer: Self-pay | Admitting: Internal Medicine

## 2017-10-08 ENCOUNTER — Encounter: Payer: Self-pay | Admitting: Internal Medicine

## 2017-10-08 ENCOUNTER — Ambulatory Visit: Payer: 59 | Admitting: Internal Medicine

## 2017-10-08 VITALS — BP 130/80 | HR 76 | Temp 97.3°F | Resp 18 | Ht 74.75 in | Wt 278.0 lb

## 2017-10-08 DIAGNOSIS — N182 Chronic kidney disease, stage 2 (mild): Secondary | ICD-10-CM

## 2017-10-08 DIAGNOSIS — E1122 Type 2 diabetes mellitus with diabetic chronic kidney disease: Secondary | ICD-10-CM | POA: Insufficient documentation

## 2017-10-08 DIAGNOSIS — Z8249 Family history of ischemic heart disease and other diseases of the circulatory system: Secondary | ICD-10-CM | POA: Insufficient documentation

## 2017-10-08 NOTE — Progress Notes (Signed)
          L  E  F  T   F  O  R      A  N  O T  H  E  R A  P   P  O  I  N  T  M  E  N  T                                         This very nice 57 y.o. MWM presents for 6 month follow up with HTN, HLD, NIDDM and Vitamin D Deficiency.      Patient is treated for HTN (1990) & BP has been controlled at home. Today's  . Patient has had no complaints of any cardiac type chest pain, palpitations, dyspnea / orthopnea / PND, dizziness, claudication, or dependent edema.     Lads lipids off Crestor were not controlled with diet. Last Lipids were not at goal: Lab Results  Component Value Date   CHOL 212 (H) 07/08/2017   HDL 25 (L) 07/08/2017   LDL  not calculated. 07/08/2017   TRIG 418 (H) 07/08/2017   CHOLHDL 8.5 (H) 07/08/2017      Also, the patient has history of Morbid Obesity (BMI 35+) and T2_NIDDM (2008) and has had no symptoms of reactive hypoglycemia, diabetic polys, paresthesias or visual blurring.  Last A1c was not at goal: Lab Results  Component Value Date   HGBA1C 6.5 (H) 07/08/2017      Further, the patient also has history of Vitamin D Deficiency and supplements vitamin D without any suspected side-effects. Last vitamin D was at goal:  Lab Results  Component Value Date   VD25OH 86 04/03/2017

## 2017-10-09 LAB — TSH: TSH: 2.66 mIU/L (ref 0.40–4.50)

## 2017-10-09 LAB — CBC WITH DIFFERENTIAL/PLATELET
BASOS PCT: 0.7 %
Basophils Absolute: 74 cells/uL (ref 0–200)
EOS ABS: 189 {cells}/uL (ref 15–500)
Eosinophils Relative: 1.8 %
HCT: 43.3 % (ref 38.5–50.0)
HEMOGLOBIN: 15.6 g/dL (ref 13.2–17.1)
Lymphs Abs: 4116 cells/uL — ABNORMAL HIGH (ref 850–3900)
MCH: 33.8 pg — AB (ref 27.0–33.0)
MCHC: 36 g/dL (ref 32.0–36.0)
MCV: 93.7 fL (ref 80.0–100.0)
MONOS PCT: 9.9 %
MPV: 12.3 fL (ref 7.5–12.5)
NEUTROS ABS: 5082 {cells}/uL (ref 1500–7800)
Neutrophils Relative %: 48.4 %
PLATELETS: 201 10*3/uL (ref 140–400)
RBC: 4.62 10*6/uL (ref 4.20–5.80)
RDW: 12.9 % (ref 11.0–15.0)
Total Lymphocyte: 39.2 %
WBC mixed population: 1040 cells/uL — ABNORMAL HIGH (ref 200–950)
WBC: 10.5 10*3/uL (ref 3.8–10.8)

## 2017-10-09 LAB — LIPID PANEL
Cholesterol: 205 mg/dL — ABNORMAL HIGH (ref <200)
HDL: 30 mg/dL — AB (ref >40)
Non-HDL Cholesterol (Calc): 175 mg/dL (calc) — ABNORMAL HIGH (ref <130)
TRIGLYCERIDES: 525 mg/dL — AB (ref <150)
Total CHOL/HDL Ratio: 6.8 (calc) — ABNORMAL HIGH (ref <5.0)

## 2017-10-09 LAB — HEMOGLOBIN A1C
EAG (MMOL/L): 9 (calc)
HEMOGLOBIN A1C: 7.3 %{Hb} — AB (ref <5.7)
MEAN PLASMA GLUCOSE: 163 (calc)

## 2017-10-09 LAB — INSULIN, RANDOM: Insulin: 38.4 u[IU]/mL — ABNORMAL HIGH (ref 2.0–19.6)

## 2017-10-09 LAB — COMPLETE METABOLIC PANEL WITH GFR
AG Ratio: 1.4 (calc) (ref 1.0–2.5)
ALKALINE PHOSPHATASE (APISO): 64 U/L (ref 40–115)
ALT: 45 U/L (ref 9–46)
AST: 26 U/L (ref 10–35)
Albumin: 4.2 g/dL (ref 3.6–5.1)
BILIRUBIN TOTAL: 0.4 mg/dL (ref 0.2–1.2)
BUN/Creatinine Ratio: 29 (calc) — ABNORMAL HIGH (ref 6–22)
BUN: 29 mg/dL — AB (ref 7–25)
CHLORIDE: 101 mmol/L (ref 98–110)
CO2: 23 mmol/L (ref 20–32)
CREATININE: 0.99 mg/dL (ref 0.70–1.33)
Calcium: 9.8 mg/dL (ref 8.6–10.3)
GFR, Est African American: 97 mL/min/{1.73_m2} (ref >=60)
GFR, Est Non African American: 84 mL/min/{1.73_m2} (ref >=60)
GLUCOSE: 153 mg/dL — AB (ref 65–99)
Globulin: 2.9 g/dL (calc) (ref 1.9–3.7)
Potassium: 4.6 mmol/L (ref 3.5–5.3)
Sodium: 135 mmol/L (ref 135–146)
TOTAL PROTEIN: 7.1 g/dL (ref 6.1–8.1)

## 2017-10-09 LAB — VITAMIN D 25 HYDROXY (VIT D DEFICIENCY, FRACTURES): Vit D, 25-Hydroxy: 56 ng/mL (ref 30–100)

## 2017-10-09 LAB — MAGNESIUM: MAGNESIUM: 2 mg/dL (ref 1.5–2.5)

## 2017-10-21 LAB — HEMOGLOBIN A1C
EAG (MMOL/L): 7.7 (calc)
HEMOGLOBIN A1C: 6.5 %{Hb} — AB (ref <5.7)
MEAN PLASMA GLUCOSE: 140 (calc)

## 2017-10-21 LAB — CBC WITH DIFFERENTIAL/PLATELET
Basophils Absolute: 90 cells/uL (ref 0–200)
Basophils Relative: 0.8 %
EOS PCT: 1.6 %
Eosinophils Absolute: 181 cells/uL (ref 15–500)
HCT: 43.8 % (ref 38.5–50.0)
Hemoglobin: 15.8 g/dL (ref 13.2–17.1)
Lymphs Abs: 4577 cells/uL — ABNORMAL HIGH (ref 850–3900)
MCH: 32.9 pg (ref 27.0–33.0)
MCHC: 36.1 g/dL — ABNORMAL HIGH (ref 32.0–36.0)
MCV: 91.3 fL (ref 80.0–100.0)
MPV: 12.3 fL (ref 7.5–12.5)
Monocytes Relative: 9.9 %
Neutro Abs: 5334 cells/uL (ref 1500–7800)
Neutrophils Relative %: 47.2 %
PLATELETS: 220 10*3/uL (ref 140–400)
RBC: 4.8 10*6/uL (ref 4.20–5.80)
RDW: 12.7 % (ref 11.0–15.0)
TOTAL LYMPHOCYTE: 40.5 %
WBC mixed population: 1119 cells/uL — ABNORMAL HIGH (ref 200–950)
WBC: 11.3 10*3/uL — AB (ref 3.8–10.8)

## 2017-10-21 LAB — COMPLETE METABOLIC PANEL WITH GFR
AG Ratio: 1.4 (calc) (ref 1.0–2.5)
ALT: 47 U/L — AB (ref 9–46)
AST: 31 U/L (ref 10–35)
Albumin: 4.4 g/dL (ref 3.6–5.1)
Alkaline phosphatase (APISO): 62 U/L (ref 40–115)
BUN/Creatinine Ratio: 27 (calc) — ABNORMAL HIGH (ref 6–22)
BUN: 27 mg/dL — ABNORMAL HIGH (ref 7–25)
CALCIUM: 9.7 mg/dL (ref 8.6–10.3)
CO2: 27 mmol/L (ref 20–32)
CREATININE: 1 mg/dL (ref 0.70–1.33)
Chloride: 100 mmol/L (ref 98–110)
GFR, Est African American: 96 mL/min/{1.73_m2} (ref >=60)
GFR, Est Non African American: 83 mL/min/{1.73_m2} (ref >=60)
GLUCOSE: 128 mg/dL — AB (ref 65–99)
Globulin: 3.1 g/dL (calc) (ref 1.9–3.7)
POTASSIUM: 4.6 mmol/L (ref 3.5–5.3)
Sodium: 136 mmol/L (ref 135–146)
Total Bilirubin: 0.6 mg/dL (ref 0.2–1.2)
Total Protein: 7.5 g/dL (ref 6.1–8.1)

## 2017-10-21 LAB — TSH: TSH: 3.47 mIU/L (ref 0.40–4.50)

## 2017-10-21 LAB — LIPID PANEL
CHOL/HDL RATIO: 8.5 (calc) — AB (ref <5.0)
Cholesterol: 212 mg/dL — ABNORMAL HIGH (ref <200)
HDL: 25 mg/dL — AB (ref >40)
NON-HDL CHOLESTEROL (CALC): 187 mg/dL — AB (ref <130)
TRIGLYCERIDES: 418 mg/dL — AB (ref <150)

## 2017-10-29 ENCOUNTER — Encounter: Payer: Self-pay | Admitting: *Deleted

## 2017-10-29 ENCOUNTER — Ambulatory Visit (INDEPENDENT_AMBULATORY_CARE_PROVIDER_SITE_OTHER): Payer: 59 | Admitting: Internal Medicine

## 2017-10-29 ENCOUNTER — Encounter: Payer: Self-pay | Admitting: Internal Medicine

## 2017-10-29 VITALS — BP 136/62 | HR 80 | Temp 97.1°F | Resp 18 | Ht 74.75 in | Wt 281.0 lb

## 2017-10-29 DIAGNOSIS — E559 Vitamin D deficiency, unspecified: Secondary | ICD-10-CM

## 2017-10-29 DIAGNOSIS — I10 Essential (primary) hypertension: Secondary | ICD-10-CM

## 2017-10-29 DIAGNOSIS — E782 Mixed hyperlipidemia: Secondary | ICD-10-CM | POA: Diagnosis not present

## 2017-10-29 DIAGNOSIS — N182 Chronic kidney disease, stage 2 (mild): Secondary | ICD-10-CM

## 2017-10-29 DIAGNOSIS — E1122 Type 2 diabetes mellitus with diabetic chronic kidney disease: Secondary | ICD-10-CM

## 2017-10-29 DIAGNOSIS — Z79899 Other long term (current) drug therapy: Secondary | ICD-10-CM

## 2017-10-29 MED ORDER — GABAPENTIN 800 MG PO TABS: ORAL_TABLET | ORAL | 1 refills | Status: DC

## 2017-10-29 NOTE — Progress Notes (Signed)
This very nice 57 y.o. MWM presents for 6 month follow up with HTN, HLD, Pre-Diabetes and Vitamin D Deficiency.      Patient is treated for HTN (1990) & BP has been controlled at home. Today's BP is at goal - 136/62. Patient has had no complaints of any cardiac type chest pain, palpitations, dyspnea / orthopnea / PND, dizziness, claudication, or dependent edema.     Hyperlipidemia is not controlled with diet & off Crestor. Patient denies myalgias or other med SE's. Last Lipids were not at goal: Lab Results  Component Value Date   CHOL 205 (H) 10/08/2017   HDL 30 (L) 10/08/2017   LDLCALC not calculated 10/08/2017   TRIG 525 (H) 10/08/2017   CHOLHDL 6.8 (H) 10/08/2017      Also, the patient has history of Morbid Obesity (BMI 35+) and T2_NIDDM (2008) and has had no symptoms of reactive hypoglycemia, diabetic polys, paresthesias or visual blurring.  Last A1c was 6.5% in June and recent A1c was not at goal: Lab Results  Component Value Date   HGBA1C 7.3 (H) 10/08/2017      Further, the patient also has history of Vitamin D Deficiency ("24"/2008) and supplements vitamin D without any suspected side-effects. Last vitamin D was near goal:   Lab Results  Component Value Date   VD25OH 56 10/08/2017   Current Outpatient Medications on File Prior to Visit  Medication Sig  . aspirin 81 MG tablet Take 81 mg by mouth daily.  . Cholecalciferol 50000 units TABS Take 1 tablet by mouth once a week.  . citalopram (CELEXA) 40 MG tablet TAKE 1 TABLET BY MOUTH ONCE DAILY FOR MOOD  . hydrochlorothiazide (HYDRODIURIL) 25 MG tablet TAKE 1 TABLET BY MOUTH ONCE DAILY  . lisinopril (PRINIVIL,ZESTRIL) 20 MG tablet TAKE 1 TABLET BY MOUTH ONCE DAILY  . metFORMIN (GLUCOPHAGE-XR) 500 MG 24 hr tablet TAKE 2 TABLETS BY MOUTH TWICE DAILY FOR  DIABETES  . OVER THE COUNTER MEDICATION Takes Vitamin D 50,000 units and was advised to increase to 2 times a week.  Buys Vitamin D on-line.  . rosuvastatin (CRESTOR) 40 MG  tablet Take 1/2 to 1 tablet daily or as directed for Cholesterol (Patient not taking: Reported on 10/29/2017)   No current facility-administered medications on file prior to visit.    Allergies  Allergen Reactions  . Invokana [Canagliflozin]     Extremity edema/caused pain  . Codeine Camsylate [Codeine] Rash  . Morphine And Related Rash   PMHx:   Past Medical History:  Diagnosis Date  . Diabetic neuropathy ([name])   . History of hepatitis C   . History of kidney stones   . Hyperlipidemia   . Hypertension   . Other testicular hypofunction   . Type II or unspecified type diabetes mellitus without mention of complication, not stated as uncontrolled   . Vitamin D deficiency    Immunization History  Administered Date(s) Administered  . PPD Test 08/31/2013, 11/08/2014, 04/03/2017  . Td 01/21/2005  . Tdap 12/25/2016   Past Surgical History:  Procedure Laterality Date  . CHOLECYSTECTOMY  1987  . ORIF TIBIA FRACTURE     FHx:    Reviewed / unchanged  SHx:    Reviewed / unchanged   Systems Review:  Constitutional: Denies fever, chills, wt changes, headaches, insomnia, fatigue, night sweats, change in appetite. Eyes: Denies redness, blurred vision, diplopia, discharge, itchy, watery eyes.  ENT: Denies discharge, congestion, post nasal drip, epistaxis, sore throat, earache, hearing  loss, dental pain, tinnitus, vertigo, sinus pain, snoring.  CV: Denies chest pain, palpitations, irregular heartbeat, syncope, dyspnea, diaphoresis, orthopnea, PND, claudication or edema. Respiratory: denies cough, dyspnea, DOE, pleurisy, hoarseness, laryngitis, wheezing.  Gastrointestinal: Denies dysphagia, odynophagia, heartburn, reflux, water brash, abdominal pain or cramps, nausea, vomiting, bloating, diarrhea, constipation, hematemesis, melena, hematochezia  or hemorrhoids. Genitourinary: Denies dysuria, frequency, urgency, nocturia, hesitancy, discharge, hematuria or flank pain. Musculoskeletal: Denies  arthralgias, myalgias, stiffness, jt. swelling, pain, limping or strain/sprain.  Skin: Denies pruritus, rash, hives, warts, acne, eczema or change in skin lesion(s). Neuro: No weakness, tremor, incoordination, spasms, paresthesia or pain. Psychiatric: Denies confusion, memory loss or sensory loss. Endo: Denies change in weight, skin or hair change.  Heme/Lymph: No excessive bleeding, bruising or enlarged lymph nodes.  Physical Exam  BP 136/62   Pulse 80   Temp (!) 97.1 F (36.2 C)   Resp 18   Ht 6' 2.75" (1.899 m)   Wt 281 lb (127.5 kg)   BMI 35.36 kg/m   Appears  Over nourished, well groomed  and in no distress.  Eyes: PERRLA, EOMs, conjunctiva no swelling or erythema. Sinuses: No frontal/maxillary tenderness ENT/Mouth: EAC's clear, TM's nl w/o erythema, bulging. Nares clear w/o erythema, swelling, exudates. Oropharynx clear without erythema or exudates. Oral hygiene is good. Tongue normal, non obstructing. Hearing intact.  Neck: Supple. Thyroid not palpable. Car 2+/2+ without bruits, nodes or JVD. Chest: Respirations nl with BS clear & equal w/o rales, rhonchi, wheezing or stridor.  Cor: Heart sounds normal w/ regular rate and rhythm without sig. murmurs, gallops, clicks or rubs. Peripheral pulses normal and equal  without edema.  Abdomen: Soft & bowel sounds normal. Non-tender w/o guarding, rebound, hernias, masses or organomegaly.  Lymphatics: Unremarkable.  Musculoskeletal: Full ROM all peripheral extremities, joint stability, 5/5 strength and normal gait.  Skin: Warm, dry without exposed rashes, lesions or ecchymosis apparent.  Neuro: Cranial nerves intact, reflexes equal bilaterally. Motor testing grossly intact.  Sensation decreased  to touch, vibratory and Monofilament to the toes bilaterally.  Tendon reflexes grossly intact.  Pysch: Alert & oriented x 3.  Insight and judgement nl & appropriate. No ideations.  Assessment and Plan:  1. Essential hypertension  - Continue  medication, monitor blood pressure at home.  - Continue DASH diet.  Reminder to go to the ER if any CP,  SOB, nausea, dizziness, severe HA, changes vision/speech.  2. Hyperlipidemia, mixed  - Continue diet/meds, exercise,& lifestyle modifications.  - Continue monitor periodic cholesterol/liver & renal functions    3. Type 2 diabetes mellitus with stage 2 chronic kidney disease, without long-term current use of insulin ([name])  - Continue diet, exercise,  - lifestyle modifications.  - Monitor appropriate labs.  4. Vitamin D deficiency  - Continue supplementation.   5. Medication management      Discussed  regular exercise, BP monitoring, weight control to achieve/maintain BMI less than 25 and discussed med and SE's. Recent labs reviewed with patient.  Over 30 minutes of exam, counseling, chart review was performed.

## 2017-10-29 NOTE — Patient Instructions (Signed)

## 2017-11-05 ENCOUNTER — Other Ambulatory Visit: Payer: Self-pay | Admitting: *Deleted

## 2017-11-05 DIAGNOSIS — N182 Chronic kidney disease, stage 2 (mild): Principal | ICD-10-CM

## 2017-11-05 DIAGNOSIS — E1122 Type 2 diabetes mellitus with diabetic chronic kidney disease: Secondary | ICD-10-CM

## 2017-11-05 MED ORDER — GABAPENTIN 800 MG PO TABS: ORAL_TABLET | ORAL | 1 refills | Status: DC

## 2017-11-20 ENCOUNTER — Other Ambulatory Visit: Payer: Self-pay | Admitting: Internal Medicine

## 2017-11-20 DIAGNOSIS — N182 Chronic kidney disease, stage 2 (mild): Principal | ICD-10-CM

## 2017-11-20 DIAGNOSIS — E1122 Type 2 diabetes mellitus with diabetic chronic kidney disease: Secondary | ICD-10-CM

## 2017-12-03 ENCOUNTER — Other Ambulatory Visit: Payer: Self-pay | Admitting: Internal Medicine

## 2017-12-03 MED ORDER — DEXTROMETHORPHAN-GUAIFENESIN 5-100 MG/5ML PO LIQD: ORAL | 3 refills | Status: DC

## 2017-12-08 ENCOUNTER — Encounter: Payer: Self-pay | Admitting: Adult Health

## 2017-12-08 ENCOUNTER — Ambulatory Visit: Payer: 59 | Admitting: Adult Health

## 2017-12-08 VITALS — BP 130/78 | HR 65 | Temp 96.3°F | Ht 74.75 in | Wt 281.0 lb

## 2017-12-08 DIAGNOSIS — J209 Acute bronchitis, unspecified: Secondary | ICD-10-CM | POA: Diagnosis not present

## 2017-12-08 MED ORDER — AZITHROMYCIN 250 MG PO TABS: ORAL_TABLET | ORAL | 1 refills | Status: AC

## 2017-12-08 MED ORDER — PREDNISONE 20 MG PO TABS: ORAL_TABLET | ORAL | 0 refills | Status: DC

## 2017-12-08 NOTE — Progress Notes (Signed)
Assessment and Plan:  Gibbs was seen today for cough and laryngitis.  Diagnoses and all orders for this visit:  Acute bronchitis, unspecified organism Discussed need to stop smoking, patient not ready to do so, declines meds Declines CXR, inhaler sample Nasal saline spray for congestion. Nasal steroids, allergy pill, oral steroids offered Follow up as needed, discussed if not improving quickly will follow up with CXR and trial of inhaler, he is in agreement and will call back if not better by the end of this week -     azithromycin (ZITHROMAX) 250 MG tablet; Take 2 tablets (500 mg) on  Day 1,  followed by 1 tablet (250 mg) once daily on Days 2 through 5. -     predniSONE (DELTASONE) 20 MG tablet; 2 tablets daily for 3 days, 1 tablet daily for 4 days.  Further disposition pending results of labs. Discussed med's effects and SE's.   Over 15 minutes of exam, counseling, chart review, and critical decision making was performed.   Future Appointments  Date Time Provider Department Center  01/07/2018  4:00 PM Quentin Mulling, PA-C [facility] None  05/13/2018  3:00 PM Lucky Cowboy, MD [facility] None    ------------------------------------------------------------------------------------------------------------------   HPI BP 130/78   Pulse 65   Temp (!) 96.3 F (35.7 C)   Ht 6' 2.75" (1.899 m)   Wt 281 lb (127.5 kg)   SpO2 98%   BMI 35.36 kg/m   57 y.o.male, smoker (1 PPD), hx of htn, COPD (not on inhalers), T2DM, hep C presents for evaluation of URI Sx; reports symptoms ongoing for 2 weeks, but suddenly worse this AM. He reports he has ongoing cough spells productive of scant clear discharge; denies sore throat, fevers, chest pain, dyspnea. He does not some hoarseness today which is new. Denies headache, nasal congestion, ear pressure, facial pain, nausea, diarrhea, post-nasal drip. Denies seasonal allergies or reflux symptoms.   He has taken mucinex which he reports he was  helpful, has taken dextromethorphan-guaifenesin cough syrup which has been helpful.   Past Medical History:  Diagnosis Date  . Diabetic neuropathy ([name])   . History of hepatitis C   . History of kidney stones   . Hyperlipidemia   . Hypertension   . Other testicular hypofunction   . Type II or unspecified type diabetes mellitus without mention of complication, not stated as uncontrolled   . Vitamin D deficiency      Allergies  Allergen Reactions  . Invokana [Canagliflozin]     Extremity edema/caused pain  . Codeine Camsylate [Codeine] Rash  . Morphine And Related Rash    Current Outpatient Medications on File Prior to Visit  Medication Sig  . aspirin 81 MG tablet Take 81 mg by mouth daily.  . Cholecalciferol 50000 units TABS Take 1 tablet by mouth once a week.  . citalopram (CELEXA) 40 MG tablet TAKE 1 TABLET BY MOUTH ONCE DAILY FOR MOOD  . Dextromethorphan-guaiFENesin 5-100 MG/5ML LIQD Take 1 to 2 teaspoonfuls every 12 hours for cough  . gabapentin (NEURONTIN) 800 MG tablet Take 1/2 to 1 tablet 3 to 4 x /day for Diabetic Neuropathy Pain  . hydrochlorothiazide (HYDRODIURIL) 25 MG tablet TAKE 1 TABLET BY MOUTH ONCE DAILY  . lisinopril (PRINIVIL,ZESTRIL) 20 MG tablet TAKE 1 TABLET BY MOUTH ONCE DAILY  . metFORMIN (GLUCOPHAGE-XR) 500 MG 24 hr tablet TAKE 2 TABLETS BY MOUTH TWICE DAILY FOR DIABETES  . OVER THE COUNTER MEDICATION Takes Vitamin D 50,000 units and was advised to increase  to 2 times a week.  Buys Vitamin D on-line.  . rosuvastatin (CRESTOR) 40 MG tablet Take 1/2 to 1 tablet daily or as directed for Cholesterol (Patient not taking: Reported on 12/08/2017)   No current facility-administered medications on file prior to visit.     ROS: all negative except above.   Physical Exam:  BP 130/78   Pulse 65   Temp (!) 96.3 F (35.7 C)   Ht 6' 2.75" (1.899 m)   Wt 281 lb (127.5 kg)   SpO2 98%   BMI 35.36 kg/m   General Appearance: Well nourished, in no apparent  distress. Eyes: PERRLA, EOMs, conjunctiva no swelling or erythema Sinuses: No Frontal/maxillary tenderness ENT/Mouth: Ext aud canals clear, TMs without erythema, bulging. No erythema, swelling, or exudate on post pharynx.  Tonsils not swollen or erythematous. Hearing normal.  Neck: Supple, thyroid normal.  Respiratory: Respiratory effort normal, BS with scant coarse expiratory wheeze to right mid/lower, without rales, rhonchi, fine wheezing or stridor. No changes with cough.  Cardio: RRR with no MRGs. Brisk peripheral pulses without edema.  Abdomen: Soft, + BS.  Non tender.. Lymphatics: Non tender without lymphadenopathy.  Musculoskeletal: Symmetrical strength, normal gait.  Skin: Warm, dry without rashes, lesions, ecchymosis.  Neuro: Cranial nerves intact. Normal muscle tone, no cerebellar symptoms.  Psych: Awake and oriented X 3, normal affect, Insight and Judgment appropriate.     Dan MakerAshley C Lizandro Spellman, NP 5:51 PM [facility]

## 2017-12-08 NOTE — Patient Instructions (Signed)

## 2017-12-20 ENCOUNTER — Other Ambulatory Visit: Payer: Self-pay | Admitting: Physician Assistant

## 2017-12-20 DIAGNOSIS — I10 Essential (primary) hypertension: Secondary | ICD-10-CM

## 2018-01-06 NOTE — Progress Notes (Signed)
FOLLOW UP  Assessment and Plan:   Hypertension Well controlled with current medications  Monitor blood pressure at home; patient to call if consistently greater than 130/80 Continue DASH diet.   Reminder to go to the ER if any CP, SOB, nausea, dizziness, severe HA, changes vision/speech, left arm numbness and tingling and jaw pain.  Cholesterol Currently above goal of LDL <70; enphasized need to start cholesterol medication - rosu vastatin 20 mg - will start with every other day as has not previously been on statins Continue low cholesterol diet and exercise.  Check lipid panel.   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  Perform daily foot/skin check, notify office of any concerning changes.  Check A1C  Obesity with co morbidities Long discussion about weight loss, diet, and exercise Recommended diet heavy in fruits and veggies and low in animal meats, cheeses, and dairy products, appropriate calorie intake Discussed ideal weight for height Will follow up in 3 months  Vitamin D Def At goal at last visit; continue supplementation to maintain goal of 70-100 Defer Vit D level  Tobacco use Discussed risks associated with tobacco use and advised to reduce or quit Patient is not ready to do so, but advised to consider strongly Will follow up at the next visit  COPD Will start on anoro, 1 month, declines CXR at this time, discuss next OV  Continue diet and meds as discussed. Further disposition pending results of labs. Discussed med's effects and SE's.   Over 30 minutes of exam, counseling, chart review, and critical decision making was performed.   Future Appointments  Date Time Provider Department Center  05/13/2018  3:00 PM Lucky Cowboy, MD [facility] None     ----------------------------------------------------------------------------------------------------------------------  HPI 57 y.o. male  presents for 3 month follow up on hypertension, cholesterol, diabetes, weight and vitamin D deficiency.  he currently continues to smoke 1 pack a day; discussed risks associated with smoking, patient is not ready to quit. He had recent bronchitis, last CXR was 09/2016.   He states his sleep has improved with melatonin and so he is not smoking as much.   BMI is Body mass index is 35.51 kg/m., he has been working on diet and exercise. Does watch portions, walks around his office building on 10 min breaks Wt Readings from Last 3 Encounters:  01/07/18 282 lb 3.2 oz (128 kg)  12/08/17 281 lb (127.5 kg)  10/29/17 281 lb (127.5 kg)   His blood pressure has been controlled at home, today their BP is BP: 130/76  He does not workout. He denies chest pain, shortness of breath, dizziness.   He is on cholesterol medication (has been prescribed crestor 20 mg daily, but hasn't been taking)  and denies myalgias. His cholesterol is not at goal. The cholesterol last visit was:   Lab Results  Component Value Date   CHOL 205 (H) 10/08/2017   HDL 30 (L) 10/08/2017   LDLCALC  10/08/2017     Comment:     . LDL cholesterol not calculated. Triglyceride levels greater than 400 mg/dL invalidate calculated LDL results. . Reference range: <100 . Desirable range <100 mg/dL for primary prevention;   <70 mg/dL for patients with CHD or diabetic patients  with > or = 2 CHD risk factors. Marland Kitchen LDL-C is now calculated using the Martin-Hopkins  calculation, which is a validated novel method providing  better accuracy than the Friedewald equation in the  estimation  of LDL-C.  Horald PollenMartin SS et al. Lenox AhrJAMA. 2956;213(082013;310(19): 2061-2068  (http://education.[employer].com/faq/FAQ164)    TRIG 525 (H) 10/08/2017   CHOLHDL 6.8 (H) 10/08/2017    He has been working on diet and exercise  for T2 diabetes (on metformin 500 mg x 4 daily), he is on gabapentin 2 pill a day for neuropathy and states it is helping and denies foot ulcerations, increased appetite, nausea, polydipsia, polyuria, visual disturbances, vomiting and weight loss. He does not currently check sugars, he does not have a machine. Last A1C in the office was:  Lab Results  Component Value Date   HGBA1C 7.3 (H) 10/08/2017   Patient is on Vitamin D supplement and at goal at recent check:    Lab Results  Component Value Date   VD25OH 56 10/08/2017        Current Medications:  Current Outpatient Medications on File Prior to Visit  Medication Sig  . aspirin 81 MG tablet Take 81 mg by mouth daily.  . Cholecalciferol 50000 units TABS Take 1 tablet by mouth once a week.  . citalopram (CELEXA) 40 MG tablet TAKE 1 TABLET BY MOUTH ONCE DAILY FOR MOOD  . Dextromethorphan-guaiFENesin 5-100 MG/5ML LIQD Take 1 to 2 teaspoonfuls every 12 hours for cough  . gabapentin (NEURONTIN) 800 MG tablet Take 1/2 to 1 tablet 3 to 4 x /day for Diabetic Neuropathy Pain  . hydrochlorothiazide (HYDRODIURIL) 25 MG tablet TAKE 1 TABLET BY MOUTH ONCE DAILY  . lisinopril (PRINIVIL,ZESTRIL) 20 MG tablet TAKE 1 TABLET BY MOUTH ONCE DAILY  . metFORMIN (GLUCOPHAGE-XR) 500 MG 24 hr tablet TAKE 2 TABLETS BY MOUTH TWICE DAILY FOR DIABETES  . OVER THE COUNTER MEDICATION Takes Vitamin D 50,000 units and was advised to increase to 2 times a week.  Buys Vitamin D on-line.  . rosuvastatin (CRESTOR) 40 MG tablet Take 1/2 to 1 tablet daily or as directed for Cholesterol   No current facility-administered medications on file prior to visit.      Allergies:  Allergies  Allergen Reactions  . Invokana [Canagliflozin]     Extremity edema/caused pain  . Codeine Camsylate [Codeine] Rash  . Morphine And Related Rash     Medical History:  Past Medical History:  Diagnosis Date  . Diabetic neuropathy ([name])   . History of hepatitis C   . History of  kidney stones   . Hyperlipidemia   . Hypertension   . Other testicular hypofunction   . Type II or unspecified type diabetes mellitus without mention of complication, not stated as uncontrolled   . Vitamin D deficiency    Family history- Reviewed and unchanged Social history- Reviewed and unchanged   Review of Systems:  Review of Systems  Constitutional: Negative for malaise/fatigue and weight loss.  HENT: Negative for hearing loss and tinnitus.   Eyes: Negative for blurred vision and double vision.  Respiratory: Negative for cough, shortness of breath and wheezing.   Cardiovascular: Negative for chest pain, palpitations, orthopnea, claudication and leg swelling.  Gastrointestinal: Negative for abdominal pain, blood in stool, constipation, diarrhea, heartburn, melena, nausea and vomiting.  Genitourinary: Negative.   Musculoskeletal: Negative for joint pain and myalgias.  Skin: Negative for rash.  Neurological: Negative for dizziness, tingling, sensory change, weakness and headaches.  Endo/Heme/Allergies: Negative for polydipsia.  Psychiatric/Behavioral: Negative for depression, hallucinations, memory loss and substance abuse. The patient has insomnia. The patient is not nervous/anxious.   All other systems reviewed and are negative.     Physical Exam: BP 130/76  Pulse 86   Temp 98.9 F (37.2 C)   Ht 6' 2.75" (1.899 m)   Wt 282 lb 3.2 oz (128 kg)   SpO2 98%   BMI 35.51 kg/m  Wt Readings from Last 3 Encounters:  01/07/18 282 lb 3.2 oz (128 kg)  12/08/17 281 lb (127.5 kg)  10/29/17 281 lb (127.5 kg)   General Appearance: Well nourished, in no apparent distress. Eyes: PERRLA, EOMs, conjunctiva no swelling or erythema Sinuses: No Frontal/maxillary tenderness ENT/Mouth: Ext aud canals clear, TMs without erythema, bulging. No erythema, swelling, or exudate on post pharynx.  Tonsils not swollen or erythematous. Hearing normal.  Neck: Supple, thyroid normal.  Respiratory:  Respiratory effort normal, BS equal bilaterally without rales, rhonchi, wheezing or stridor.  Cardio: RRR with no MRGs. Brisk peripheral pulses without edema.  Abdomen: Soft, + BS.  Non tender, no guarding, rebound, hernias, masses. Lymphatics: Non tender without lymphadenopathy.  Musculoskeletal: Full ROM, 5/5 strength, Normal gait Skin: Warm, dry without rashes, lesions, ecchymosis.  Neuro: Cranial nerves intact. No cerebellar symptoms.  Psych: Awake and oriented X 3, normal affect, Insight and Judgment appropriate.    Quentin Mulling, PA-C 4:11 PM [facility]

## 2018-01-07 ENCOUNTER — Ambulatory Visit: Payer: 59 | Admitting: Physician Assistant

## 2018-01-07 ENCOUNTER — Encounter: Payer: Self-pay | Admitting: Physician Assistant

## 2018-01-07 VITALS — BP 130/76 | HR 86 | Temp 98.9°F | Ht 74.75 in | Wt 282.2 lb

## 2018-01-07 DIAGNOSIS — E1122 Type 2 diabetes mellitus with diabetic chronic kidney disease: Secondary | ICD-10-CM | POA: Diagnosis not present

## 2018-01-07 DIAGNOSIS — Z79899 Other long term (current) drug therapy: Secondary | ICD-10-CM

## 2018-01-07 DIAGNOSIS — E1140 Type 2 diabetes mellitus with diabetic neuropathy, unspecified: Secondary | ICD-10-CM

## 2018-01-07 DIAGNOSIS — E782 Mixed hyperlipidemia: Secondary | ICD-10-CM

## 2018-01-07 DIAGNOSIS — J449 Chronic obstructive pulmonary disease, unspecified: Secondary | ICD-10-CM | POA: Diagnosis not present

## 2018-01-07 DIAGNOSIS — Z8249 Family history of ischemic heart disease and other diseases of the circulatory system: Secondary | ICD-10-CM

## 2018-01-07 DIAGNOSIS — I10 Essential (primary) hypertension: Secondary | ICD-10-CM | POA: Diagnosis not present

## 2018-01-07 DIAGNOSIS — E1149 Type 2 diabetes mellitus with other diabetic neurological complication: Secondary | ICD-10-CM

## 2018-01-07 DIAGNOSIS — N182 Chronic kidney disease, stage 2 (mild): Secondary | ICD-10-CM

## 2018-01-07 NOTE — Patient Instructions (Signed)
INFORMATION ABOUT YOUR STEROID INHALER  Can do steroid inhaler, NEED TO DO DAILY, this is NOT a rescue inhaler so if you are acutely short of breath please use your albuterol or call 911.  Do 1 puff ONCE a day.   Get on the crestor daily   Go to the ER if any chest pain, shortness of breath, nausea, dizziness, severe HA, changes vision/speech     Bad carbs also include fruit juice, alcohol, and sweet tea. These are empty calories that do not signal to your brain that you are full.   Please remember the good carbs are still carbs which convert into sugar. So please measure them out no more than 1/2-1 cup of rice, oatmeal, pasta, and beans  Veggies are however free foods! Pile them on.   Not all fruit is created equal. Please see the list below, the fruit at the bottom is higher in sugars than the fruit at the top. Please avoid all dried fruits.       Diabetes or even increased sugars put you at 300% increased risk of heart attack and stroke.  ALSO BEING DIABETIC YOU MAY NOT HAVE ANY PAIN WITH A HEART ATTACK.  Even worse of a chance of no pain if you are a woman.  It is very unlikely that you will have any pain with a heart attack. Likely your symptoms will be very subtle, even for very severe disease.  Your symptoms for a heart attack will likely occur when you exert your self or exercise and include: Shortness of breath Sweating Nausea Dizziness Fast or irregular heart beats Fatigue   It makes me feel better if my diabetics get their heart rate up with exercise once or twice a week and pay close attention to your body. If there is ANY change in your exercise capacity or if you have symptoms above, please STOP and call 911 or call to come to the office.   PLEASE REMEMBER:  Diabetes is preventable! Up to 85 percent of complications and morbidities among individuals with type 2 diabetes can be prevented, delayed, or effectively treated and minimized with regular visits to a health  professional, appropriate monitoring and medication, and a healthy diet and lifestyle.   Here is some information to help you keep your heart healthy: Move it! - Aim for 30 mins of activity every day. Take it slowly at first. Talk to us before starting any new exercise program.   Lose it.  -Body Mass Index (BMI) can indicate if you need to lose weight. A healthy range is 18.5-24.9. For a BMI calculator, go to [email or web address]  Waist Management -Excess abdominal fat is a risk factor for heart disease, diabetes, asthma, stroke and more. Ideal waist circumference is less than 35" for women and less than 40" for men.   Eat Right -focus on fruits, vegetables, whole grains, and meals you make yourself. Avoid foods with trans fat and high sugar/sodium content.   Snooze or Snore? - Loud snoring can be a sign of sleep apnea, a significant risk factor for high blood pressure, heart attach, stroke, and heart arrhythmias.  Kick the habit -Quit Smoking! Avoid second hand smoke. A single cigarette raises your blood pressure for 20 mins and increases the risk of heart attack and stroke for the next 24 hours.   Are Aspirin and Supplements right for you? -Add ENTERIC COATED low dose 81 mg Aspirin daily OR can do every other day if you have easy bruising to protect  your heart and head. As well as to reduce risk of Colon Cancer by 20 %, Skin Cancer by 26 % , Melanoma by 46% and Pancreatic cancer by 60%  Say "No to Stress -There may be little you can do about problems that cause stress. However, techniques such as long walks, meditation, and exercise can help you manage it.   Start Now! - Make changes one at a time and set reasonable goals to increase your likelihood of success.     Potassium Content of Foods  The body needs potassium to control blood pressure and to keep the muscles and nervous system healthy. Here are some healthy foods below that are high in potassium. Also you can get the white label  salt of "NO SALT" salt substitute, 1/4 teaspoon of this is equivalent to potassium.   FOODS AND DRINKS HIGH IN POTASSIUM FOODS MODERATE IN POTASSIUM   Fruits  Avocado (cubed),  c / 50 g.  Banana (sliced), 75 g.  Cantaloupe (cubed), 80 g.  Honeydew, 1 wedge / 85 g.  Kiwi (sliced), 90 g.  Nectarine, 1 small / 129 g.  Orange, 1 medium / 131 g. Vegetables  Artichoke,  of a medium / 64 g.  Asparagus (boiled), 90 g..  Broccoli (boiled), 78 g.  Brussels sprout (boiled), 78 g.  Butternut squash (baked), 103 g.  Chickpea (cooked), 82 g.  Green peas (cooked), 80 g.  Kidney beans (cooked), 5 tbsp / 55 g.  Lima beans (cooked),  c / 43 g.  Navy beans (cooked),  c / 61 g.  Spinach (cooked),  c / 45 g.  Sweet potato (baked),  c / 50 g.  Tomato (chopped or sliced), 90 g.  Vegetable juice.  White mushrooms (cooked), 78 g.  Yam (cooked or baked),  c / 34 g.  Zucchini squash (boiled), 90 g. Other Foods and Drinks  Almonds (whole),  c / 36 g.  Fish, 3 oz / 85 g.  Nonfat fruit variety yogurt, 123 g.  Pistachio nuts, 1 oz / 28 g.  Pumpkin seeds, 1 oz / 28 g.  Red meat (broiled, cooked, grilled), 3 oz / 85 g.  Scallops (steamed), 3 oz / 85 g.  Spaghetti sauce,  c / 66 g.  Sunflower seeds (dry roasted), 1 oz / 28 g.  Veggie burger, 1 patty / 70 g. Fruits  Grapefruit,  of the fruit / 123 g  Plums (sliced), 83 g.  Tangerine, 1 large / 120 g. Vegetables  Carrots (boiled), 78 g.  Carrots (sliced), 61 g.  Rhubarb (cooked with sugar), 120 g.  Rutabaga (cooked), 120 g.  Yellow snap beans (cooked), 63 g. Other Foods and Drinks   Chicken breast (roasted and chopped),  c / 70 g.  Pita bread, 1 large / 64 g.  Shrimp (steamed), 4 oz / 113 g.  Swiss cheese (diced), 70 g.

## 2018-01-08 LAB — COMPLETE METABOLIC PANEL WITH GFR
AG Ratio: 1.3 (calc) (ref 1.0–2.5)
ALBUMIN MSPROF: 4 g/dL (ref 3.6–5.1)
ALKALINE PHOSPHATASE (APISO): 60 U/L (ref 40–115)
ALT: 55 U/L — ABNORMAL HIGH (ref 9–46)
AST: 37 U/L — ABNORMAL HIGH (ref 10–35)
BILIRUBIN TOTAL: 0.6 mg/dL (ref 0.2–1.2)
BUN: 15 mg/dL (ref 7–25)
CO2: 28 mmol/L (ref 20–32)
CREATININE: 0.91 mg/dL (ref 0.70–1.33)
Calcium: 9.3 mg/dL (ref 8.6–10.3)
Chloride: 100 mmol/L (ref 98–110)
GFR, Est African American: 107 mL/min/{1.73_m2} (ref >=60)
GFR, Est Non African American: 92 mL/min/{1.73_m2} (ref >=60)
GLOBULIN: 3 g/dL (ref 1.9–3.7)
Glucose, Bld: 164 mg/dL — ABNORMAL HIGH (ref 65–99)
Potassium: 4.3 mmol/L (ref 3.5–5.3)
Sodium: 138 mmol/L (ref 135–146)
Total Protein: 7 g/dL (ref 6.1–8.1)

## 2018-01-08 LAB — CBC WITH DIFFERENTIAL/PLATELET
Absolute Monocytes: 825 cells/uL (ref 200–950)
Basophils Absolute: 39 cells/uL (ref 0–200)
Basophils Relative: 0.4 %
EOS PCT: 1.8 %
Eosinophils Absolute: 175 cells/uL (ref 15–500)
HCT: 45.1 % (ref 38.5–50.0)
Hemoglobin: 16.2 g/dL (ref 13.2–17.1)
Lymphs Abs: 3618 cells/uL (ref 850–3900)
MCH: 33.5 pg — ABNORMAL HIGH (ref 27.0–33.0)
MCHC: 35.9 g/dL (ref 32.0–36.0)
MCV: 93.4 fL (ref 80.0–100.0)
MPV: 12.2 fL (ref 7.5–12.5)
Monocytes Relative: 8.5 %
NEUTROS PCT: 52 %
Neutro Abs: 5044 cells/uL (ref 1500–7800)
Platelets: 186 10*3/uL (ref 140–400)
RBC: 4.83 10*6/uL (ref 4.20–5.80)
RDW: 12.7 % (ref 11.0–15.0)
TOTAL LYMPHOCYTE: 37.3 %
WBC: 9.7 10*3/uL (ref 3.8–10.8)

## 2018-01-08 LAB — LIPID PANEL
Cholesterol: 188 mg/dL (ref <200)
HDL: 27 mg/dL — ABNORMAL LOW (ref >40)
LDL CHOLESTEROL (CALC): 115 mg/dL — AB
Non-HDL Cholesterol (Calc): 161 mg/dL (calc) — ABNORMAL HIGH (ref <130)
Total CHOL/HDL Ratio: 7 (calc) — ABNORMAL HIGH (ref <5.0)
Triglycerides: 324 mg/dL — ABNORMAL HIGH (ref <150)

## 2018-01-08 LAB — TSH: TSH: 3.34 m[IU]/L (ref 0.40–4.50)

## 2018-01-08 LAB — HEMOGLOBIN A1C
Hgb A1c MFr Bld: 8.6 % of total Hgb — ABNORMAL HIGH (ref <5.7)
MEAN PLASMA GLUCOSE: 200 (calc)
eAG (mmol/L): 11.1 (calc)

## 2018-02-06 ENCOUNTER — Other Ambulatory Visit: Payer: Self-pay | Admitting: Internal Medicine

## 2018-02-06 DIAGNOSIS — E1122 Type 2 diabetes mellitus with diabetic chronic kidney disease: Secondary | ICD-10-CM

## 2018-02-06 DIAGNOSIS — N182 Chronic kidney disease, stage 2 (mild): Principal | ICD-10-CM

## 2018-02-12 ENCOUNTER — Ambulatory Visit: Payer: 59 | Admitting: Adult Health

## 2018-02-12 ENCOUNTER — Ambulatory Visit: Payer: Self-pay | Admitting: Physician Assistant

## 2018-02-12 ENCOUNTER — Encounter: Payer: Self-pay | Admitting: Adult Health

## 2018-02-12 VITALS — BP 110/68 | HR 79 | Temp 96.1°F | Ht 74.75 in | Wt 283.0 lb

## 2018-02-12 DIAGNOSIS — E1122 Type 2 diabetes mellitus with diabetic chronic kidney disease: Secondary | ICD-10-CM | POA: Diagnosis not present

## 2018-02-12 DIAGNOSIS — E1169 Type 2 diabetes mellitus with other specified complication: Secondary | ICD-10-CM | POA: Diagnosis not present

## 2018-02-12 DIAGNOSIS — N182 Chronic kidney disease, stage 2 (mild): Secondary | ICD-10-CM

## 2018-02-12 DIAGNOSIS — Z79899 Other long term (current) drug therapy: Secondary | ICD-10-CM

## 2018-02-12 DIAGNOSIS — E1140 Type 2 diabetes mellitus with diabetic neuropathy, unspecified: Secondary | ICD-10-CM

## 2018-02-12 DIAGNOSIS — E1149 Type 2 diabetes mellitus with other diabetic neurological complication: Secondary | ICD-10-CM

## 2018-02-12 DIAGNOSIS — I10 Essential (primary) hypertension: Secondary | ICD-10-CM

## 2018-02-12 DIAGNOSIS — E1165 Type 2 diabetes mellitus with hyperglycemia: Secondary | ICD-10-CM

## 2018-02-12 DIAGNOSIS — E785 Hyperlipidemia, unspecified: Secondary | ICD-10-CM

## 2018-02-12 MED ORDER — ROSUVASTATIN CALCIUM 40 MG PO TABS: ORAL_TABLET | ORAL | 5 refills | Status: DC

## 2018-02-12 NOTE — Patient Instructions (Addendum)
Goals    . Blood Pressure < 130/80    . HEMOGLOBIN A1C < 7.0    . LDL CALC < 70    . Weight (lb) < 260 lb (117.9 kg)       Please see an eye doctor to get a diabetic eye exam done - have them forward us the report - you should get this done annually (diabetes can cause blindness - helps to catch problems early)  Goal for next visit is A1C <8% to avoid adding a new medication   Please check glucose every morning (fasting) and keep a log to bring with you at the next visit   Goal blood sugar in the morning is 70-130   Call insurance and ask which glucometer (blood sugar machine)  they cover and call us back  Please start rosuvastatin 20 mg (1/2 tab) - once a week - then gradually increase to 3 x 1/2 tab per week and monitor closely for side effects   You can eat as much fibrous veggies (all veggies except starchy - potatoes, corn, beets, carrots, corn, etc) - green leafy veggies, cruciferous, peppers, etc you can have as much as you want  Limit processed foods (bread, snacks, pasta, juice) -   Lean protein and healthy fats (nuts, olive oil, avocado, seeds) are good for you and ok to have         When it comes to diets, agreement about the perfect plan isn't easy to find, even among the experts. Experts at the [employer] developed an idea known as the Healthy Eating Plate. Just imagine a plate divided into logical, healthy portions.  The emphasis is on diet quality:  Load up on vegetables and fruits - one-half of your plate: Aim for color and variety, and remember that potatoes don't count.  Go for whole grains - one-quarter of your plate: Whole wheat, barley, wheat berries, quinoa, oats, brown rice, and foods made with them. If you want pasta, go with whole wheat pasta.  Protein power - one-quarter of your plate: Fish, chicken, beans, and nuts are all healthy, versatile protein sources. Limit red meat.  The diet, however, does go beyond the plate,  offering a few other suggestions.  Use healthy plant oils, such as olive, canola, soy, corn, sunflower and peanut. Check the labels, and avoid partially hydrogenated oil, which have unhealthy trans fats.  If you're thirsty, drink water. Coffee and tea are good in moderation, but skip sugary drinks and limit milk and dairy products to one or two daily servings.  The type of carbohydrate in the diet is more important than the amount. Some sources of carbohydrates, such as vegetables, fruits, whole grains, and beans-are healthier than others.  Finally, stay active.    Rosuvastatin capsules What is this medicine? ROSUVASTATIN (roe SOO va sta tin) is known as an HMG-CoA reductase inhibitor or 'statin'. It lowers cholesterol and triglycerides in the blood. Diet and lifestyle changes are often used with this drug. This medicine may be used for other purposes; ask your health care provider or pharmacist if you have questions. COMMON BRAND NAME(S): Ezallor What should I tell my health care provider before I take this medicine? They need to know if you have any of these conditions: -diabetes -if you often drink alcohol -history of stroke -kidney disease -liver disease -muscle aches or weakness -thyroid disease -an unusual or allergic reaction to rosuvastatin, other medicines, foods, dyes, or preservatives -pregnant or trying to get  pregnant -breast-feeding How should I use this medicine? Take this medicine by mouth with a glass of water. Follow the directions on the prescription label. Swallow the capsules whole. Do not crush or chew this medicine. You may open the capsule and put the contents in 1 teaspoon of applesauce. Swallow the medicine and applesauce right away. Do not chew the medicine or applesauce. You can take this medicine with or without food. Take your doses at regular intervals. Do not take your medicine more often than directed. Talk to your pediatrician about the use of this  medicine in children. Special care may be needed. Overdosage: If you think you have taken too much of this medicine contact a poison control center or emergency room at once. NOTE: This medicine is only for you. Do not share this medicine with others. What if I miss a dose? If you miss a dose, take it as soon as you can. If your next dose is to be taken in less than 12 hours, then do not take the missed dose. Take the next dose at your regular time. Do not take double or extra doses. What may interact with this medicine? Do not take this medicine with any of the following medications: -herbal medicines like red yeast rice This medicine may also interact with the following medications: -alcohol -antacids containing aluminum hydroxide or magnesium hydroxide -cyclosporine -other medicines for high cholesterol -some medicines for HIV infection -warfarin This list may not describe all possible interactions. Give your health care provider a list of all the medicines, herbs, non-prescription drugs, or dietary supplements you use. Also tell them if you smoke, drink alcohol, or use illegal drugs. Some items may interact with your medicine. What should I watch for while using this medicine? Visit your doctor or health care professional for regular check-ups. You may need regular tests to make sure your liver is working properly. Your health care professional may tell you to stop taking this medicine if you develop muscle problems. If your muscle problems do not go away after stopping this medicine, contact your health care professional. Do not become pregnant while taking this medicine. Women should inform their health care professional if they wish to become pregnant or think they might be pregnant. There is a potential for serious side effects to an unborn child. Talk to your health care professional or pharmacist for more information. Do not breast-feed an infant while taking this medicine. This  medicine may affect blood sugar levels. If you have diabetes, check with your doctor or health care professional before you change your diet or the dose of your diabetic medicine. If you are going to need surgery or other procedure, tell your doctor that you are using this medicine. This drug is only part of a total heart-health program. Your doctor or a dietician can suggest a low-cholesterol and low-fat diet to help. Avoid alcohol and smoking, and keep a proper exercise schedule. This medicine may cause a decrease in Co-Enzyme Q-10. You should make sure that you get enough Co-Enzyme Q-10 while you are taking this medicine. Discuss the foods you eat and the vitamins you take with your health care professional. What side effects may I notice from receiving this medicine? Side effects that you should report to your doctor or health care professional as soon as possible: -allergic reactions like skin rash, itching or hives, swelling of the face, lips, or tongue -dark urine -fever -joint pain -muscle cramps, pain -redness, blistering, peeling or loosening of the  skin, including inside the mouth -trouble passing urine or change in the amount of urine -unusually weak or tired -yellowing of the eyes or skin Side effects that usually do not require medical attention (report these to your doctor or health care professional if they continue or are bothersome): -constipation -heartburn -nausea -stomach gas, pain, upset This list may not describe all possible side effects. Call your doctor for medical advice about side effects. You may report side effects to FDA at 1-800-FDA-1088. Where should I keep my medicine? Keep out of the reach of children. Store at room temperature between 20 and 25 degrees C (68 and 77 degrees F). Keep container tightly closed (protect from moisture). Throw away any unused medicine after the expiration date. NOTE: This sheet is a summary. It may not cover all possible  information. If you have questions about this medicine, talk to your doctor, pharmacist, or health care provider.  2019 Elsevier/Gold Standard (2017-05-15 14:59:10)

## 2018-02-12 NOTE — Progress Notes (Signed)
Diabetes Education and Follow-Up Visit  58 y.o.male presents for diabetic education. He has Diabetes Mellitus type 2:  with diabetic mononeuropathy, he is on bASA, and denies hyperglycemia, hypoglycemia , increased appetite, nausea, polydipsia, polyuria and visual disturbances.  Last hemoglobin A1c was: Lab Results  Component Value Date   HGBA1C 8.6 (H) 01/07/2018   HGBA1C 7.3 (H) 10/08/2017   HGBA1C 6.5 (H) 07/08/2017    BMI is Body mass index is 35.61 kg/m., he has been working on diet and exercise, has stopped drinking soda in the past 2 weeks, previously was drinking ~6 cans of diet mountain dew/coke. He is drinking water only except 1 small cup of sweet tea with lunch. He declines any weight loss medications.  Wt Readings from Last 3 Encounters:  02/12/18 283 lb (128.4 kg)  01/07/18 282 lb 3.2 oz (128 kg)  12/08/17 281 lb (127.5 kg)    Pt is on a regimen of: Metformin 500 mg x 4 tabs daily   Pt checks his sugars - every 3-4 days  Lowest sugar was 120.  He has hypoglycemia awareness.  Highest sugar was 350.  Glucometer: ?   Exercise: limited due to neuropathy and leg pain after accident from '85  Patient does not have CKD He is on ACE/ARB   Lab Results  Component Value Date   GFRNONAA 92 01/07/2018    Lab Results  Component Value Date   CREATININE 0.91 01/07/2018   BUN 15 01/07/2018   NA 138 01/07/2018   K 4.3 01/07/2018   CL 100 01/07/2018   CO2 28 01/07/2018    Lab Results  Component Value Date   MICROALBUR 3.4 04/03/2017     He is not on a Statin - was prescribed rosuvastatin 40 mg but never started  He is not at goal of less than 70.  Lab Results  Component Value Date   CHOL 188 01/07/2018   HDL 27 (L) 01/07/2018   LDLCALC 115 (H) 01/07/2018   TRIG 324 (H) 01/07/2018   CHOLHDL 7.0 (H) 01/07/2018     Problem List has Essential hypertension; Hyperlipidemia associated with type 2 diabetes mellitus ([name]); Vitamin D deficiency; History of kidney  stones; Testosterone deficiency; History of hepatitis C; Medication management; Morbid obesity (BMI 35); CKD stage 2 due to type 2 diabetes mellitus ([name]); T2_NIDDM w/Peripheral Neuropathy; Smoker; COPD (chronic obstructive pulmonary disease) ([name]); Insomnia; Family history of cerebrovascular disease; and Type 2 diabetes mellitus with stage 2 chronic kidney disease, without long-term current use of insulin ([name]) on their problem list.  Medications Current Outpatient Medications on File Prior to Visit  Medication Sig  . aspirin 81 MG tablet Take 81 mg by mouth daily.  . Cholecalciferol 50000 units TABS Take 1 tablet by mouth 2 (two) times a week.   . citalopram (CELEXA) 40 MG tablet TAKE 1 TABLET BY MOUTH ONCE DAILY FOR MOOD  . gabapentin (NEURONTIN) 800 MG tablet Take 1/2 to 1 tablet 3 to 4 x /day for Diabetic Neuropathy Pains (Patient taking differently: Takes 2 tablets daily)  . hydrochlorothiazide (HYDRODIURIL) 25 MG tablet TAKE 1 TABLET BY MOUTH ONCE DAILY  . lisinopril (PRINIVIL,ZESTRIL) 20 MG tablet TAKE 1 TABLET BY MOUTH ONCE DAILY  . metFORMIN (GLUCOPHAGE-XR) 500 MG 24 hr tablet TAKE 2 TABLETS BY MOUTH TWICE DAILY FOR DIABETES  . Dextromethorphan-guaiFENesin 5-100 MG/5ML LIQD Take 1 to 2 teaspoonfuls every 12 hours for cough  . OVER THE COUNTER MEDICATION Takes Vitamin D 50,000 units and was advised to increase  to 2 times a week.  Buys Vitamin D on-line.  . rosuvastatin (CRESTOR) 40 MG tablet Take 1/2 to 1 tablet daily or as directed for Cholesterol (Patient not taking: Reported on 02/12/2018)   No current facility-administered medications on file prior to visit.     ROS- see HPI  Physical Exam: Blood pressure 110/68, pulse 79, temperature (!) 96.1 F (35.6 C), height 6' 2.75" (1.899 m), weight 283 lb (128.4 kg), SpO2 97 %. Body mass index is 35.61 kg/m. General Appearance: Well nourished, in no apparent distress. Eyes: PERRLA, EOMs, conjunctiva no swelling or erythema ENT/Mouth:  Ext aud canals clear, TMs without erythema, bulging. No erythema, swelling, or exudate on post pharynx.  Tonsils not swollen or erythematous. Hearing normal.  Respiratory: Respiratory effort normal, BS equal bilaterally without rales, rhonchi, wheezing or stridor.  Cardio: RRR with no MRGs. Brisk peripheral pulses without edema.  Abdomen: Soft, + BS.  Non tender, no guarding, rebound, hernias, masses. Musculoskeletal: Full ROM, 5/5 strength, normal gait.  Skin: Warm, dry without rashes, lesions, ecchymosis.  Neuro: Cranial nerves intact. Normal muscle tone, no cerebellar symptoms. Sensation intact.   Plan and Assessment: Diabetes Education: Reviewed 'ABCs' of diabetes management (respective goals in parentheses):  A1C (<7), blood pressure (<130/80), and cholesterol (LDL <70) Eye Exam yearly and Dental Exam every 6 months. Dietary recommendations reviewed at length Physical Activity recommendations reviewed - limited due to old accident He will call back with insurance preference for glucometer - will send in, start checking fasting glucose daily - goal is <130 Initial A1C goal is <8% for next visit to avoid adding medication - he is very resistant to adding meds He is willing to try rosuvastatin 20 mg once weekly and titrate up to 3 times a week as tolerated  - check fructosamine today    Future Appointments  Date Time Provider Department Center  05/13/2018  3:00 PM Lucky Cowboy, MD [id number] None

## 2018-02-15 LAB — FRUCTOSAMINE: Fructosamine: 314 umol/L — ABNORMAL HIGH (ref 205–285)

## 2018-03-09 ENCOUNTER — Other Ambulatory Visit: Payer: Self-pay | Admitting: Internal Medicine

## 2018-05-13 ENCOUNTER — Encounter: Payer: Self-pay | Admitting: Internal Medicine

## 2018-06-17 ENCOUNTER — Other Ambulatory Visit: Payer: Self-pay | Admitting: Internal Medicine

## 2018-06-17 DIAGNOSIS — E1122 Type 2 diabetes mellitus with diabetic chronic kidney disease: Secondary | ICD-10-CM

## 2018-06-17 DIAGNOSIS — N182 Chronic kidney disease, stage 2 (mild): Secondary | ICD-10-CM

## 2018-06-19 ENCOUNTER — Other Ambulatory Visit: Payer: Self-pay | Admitting: Internal Medicine

## 2018-07-09 ENCOUNTER — Other Ambulatory Visit: Payer: Self-pay | Admitting: Internal Medicine

## 2018-07-09 MED ORDER — METFORMIN HCL 500 MG PO TABS: ORAL_TABLET | ORAL | 3 refills | Status: DC

## 2018-07-13 ENCOUNTER — Encounter: Payer: Self-pay | Admitting: Internal Medicine

## 2018-07-13 NOTE — Progress Notes (Signed)
[facility]  [name], M.D.        [name]. Silverio Lay, P.A.-C         Liane Comber, [place]                [address] [place], [place] [address] Telephone [phone] Telefax [phone] Annual  Screening/Preventative Visit  & Comprehensive Evaluation & Examination     This very nice 58 y.o.  MWM presents for a Screening /Preventative Visit & comprehensive evaluation and management of multiple medical co-morbidities.  Patient has been followed for HTN, HLD, T2_NIDDM  and Vitamin D Deficiency.     HTN predates circa 1990. Patient's BP has been controlled at home.  Today's BP is at goal - 138/78. Patient denies any cardiac symptoms as chest pain, palpitations, shortness of breath, dizziness or ankle swelling.     Patient's hyperlipidemia is not  controlled with diet and he apparently never started his Rosuvastatin. Patient denies myalgias or other medication SE's. Last lipids were not at goal with elevated LDL & TRig's: Lab Results  Component Value Date   CHOL 188 01/07/2018   HDL 27 (L) 01/07/2018   LDLCALC 115 (H) 01/07/2018   TRIG 324 (H) 01/07/2018   CHOLHDL 7.0 (H) 01/07/2018      Patient has hx/o Testosterone Deficiency  (last year Testosterone level was 60) for several years & he has declined replacement therapy.         Patient has Morbid Obesity (BMI 35+) and T2_NIDDM since 2008 and patient denies reactive hypoglycemic symptoms, visual blurring or diabetic polys,  But he does have painful paresthesias of the distal LE's. Last A1c was not at goal Lab Results  Component Value Date   HGBA1C 8.6 (H) 01/07/2018       Finally, patient has history of Vitamin D Deficiency ("24" / 2008) and last vitamin D was  Lab Results  Component Value Date   VD25OH 36 10/08/2017   Current Outpatient Medications on File Prior to Visit  Medication Sig  . aspirin 81 MG tablet Take 81 mg by  mouth daily.  . Cholecalciferol 50000 units TABS Take 1 tablet by mouth 2 (two) times a week.   . citalopram (CELEXA) 40 MG tablet TAKE 1 TABLET BY MOUTH ONCE DAILY FOR  MOOD  . gabapentin (NEURONTIN) 800 MG tablet Take 1/2 to 1 tablet 3 to 4 x /day for Diabetic Neuropathy Pains (Patient taking differently: Takes 2 tablets daily)  . hydrochlorothiazide (HYDRODIURIL) 25 MG tablet TAKE 1 TABLET BY MOUTH ONCE DAILY  . lisinopril (PRINIVIL,ZESTRIL) 20 MG tablet TAKE 1 TABLET BY MOUTH ONCE DAILY  . metFORMIN (GLUCOPHAGE) 500 MG tablet Take 1 tablet with Breakfast & Lunch and 2 tablets with supper for Diabetes  . rosuvastatin (CRESTOR) 40 MG tablet Take 1/2 tab 3 days a week on MWF in the evening for cholesterol. (Patient not taking: Reported on 07/14/2018)   No current facility-administered medications on file prior to visit.    Allergies  Allergen Reactions  . Invokana [Canagliflozin]     Extremity edema/caused pain  . Codeine Camsylate [Codeine] Rash  . Morphine And Related Rash   Past Medical History:  Diagnosis Date  . Diabetic neuropathy ([place])   . History of hepatitis C   . History of kidney stones   . Hyperlipidemia   . Hypertension   .  Other testicular hypofunction   . Type II or unspecified type diabetes mellitus without mention of complication, not stated as uncontrolled   . Vitamin D deficiency    Health Maintenance  Topic Date Due  . OPHTHALMOLOGY EXAM  11/05/1968  . COLONOSCOPY  11/05/2008  . HEMOGLOBIN A1C  07/09/2018  . PNEUMOCOCCAL POLYSACCHARIDE VACCINE AGE 46-64 HIGH RISK  02/13/2019 (Originally 11/05/1962)  . INFLUENZA VACCINE  08/22/2018  . FOOT EXAM  10/09/2018  . TETANUS/TDAP  12/26/2026  . Hepatitis C Screening  Completed  . HIV Screening  Completed   Immunization History  Administered Date(s) Administered  . PPD Test 08/31/2013, 11/08/2014, 04/03/2017  . Td 01/21/2005  . Tdap 12/25/2016   Last Colon - 2007 - Dr Neomia GlassJL Edwards - overdue 10 yr f/u   Past  Surgical History:  Procedure Laterality Date  . CHOLECYSTECTOMY  1987  . ORIF TIBIA FRACTURE     Family History  Problem Relation Age of Onset  . Hypertension Mother   . Cancer Father        colon  . Alzheimer's disease Father   . Stroke Father    Social History   Socioeconomic History  . Marital status: Married    Spouse name: Rosey Batheresa  . Number of children: None  Occupational History  . Dispatcher for [employer]  Tobacco Use  . Smoking status: Current Every Day Smoker    Packs/day: 1.00    Types: Cigarettes  . Smokeless tobacco: Never Used  Substance and Sexual Activity  . Alcohol use: No  . Drug use: Yes    Types: Marijuana  . Sexual activity: Not on file    ROS Constitutional: Denies fever, chills, weight loss/gain, headaches, insomnia,  night sweats or change in appetite. Does c/o fatigue. Eyes: Denies redness, blurred vision, diplopia, discharge, itchy or watery eyes.  ENT: Denies discharge, congestion, post nasal drip, epistaxis, sore throat, earache, hearing loss, dental pain, Tinnitus, Vertigo, Sinus pain or snoring.  Cardio: Denies chest pain, palpitations, irregular heartbeat, syncope, dyspnea, diaphoresis, orthopnea, PND, claudication or edema Respiratory: denies cough, dyspnea, DOE, pleurisy, hoarseness, laryngitis or wheezing.  Gastrointestinal: Denies dysphagia, heartburn, reflux, water brash, pain, cramps, nausea, vomiting, bloating, diarrhea, constipation, hematemesis, melena, hematochezia, jaundice or hemorrhoids Genitourinary: Denies dysuria, frequency, urgency, nocturia, hesitancy, discharge, hematuria or flank pain Musculoskeletal: Denies arthralgia, myalgia, stiffness, Jt. Swelling, pain, limp or strain/sprain. Denies Falls. Skin: Denies puritis, rash, hives, warts, acne, eczema or change in skin lesion Neuro: No weakness, tremor, incoordination, spasms, paresthesia or pain Psychiatric: Denies confusion, memory loss or sensory loss. Denies  Depression. Endocrine: Denies change in weight, skin, hair change, nocturia, and paresthesia, diabetic polys, visual blurring or hyper / hypo glycemic episodes.  Heme/Lymph: No excessive bleeding, bruising or enlarged lymph nodes.  Physical Exam  BP 138/78   Pulse 84   Temp 97.8 F (36.6 C)   Resp 16   Ht 6' 2.5" (1.892 m)   Wt 279 lb 3.2 oz (126.6 kg)   BMI 35.37 kg/m   General Appearance: Over nourished and well groomed and in no apparent distress.  Eyes: PERRLA, EOMs, conjunctiva no swelling or erythema, normal fundi and vessels. Sinuses: No frontal/maxillary tenderness ENT/Mouth: EACs patent / TMs  nl. Nares clear without erythema, swelling, mucoid exudates. Oral hygiene is good. No erythema, swelling, or exudate. Tongue normal, non-obstructing. Tonsils not swollen or erythematous. Hearing normal.  Neck: Supple, thyroid not palpable. No bruits, nodes or JVD. Respiratory: Respiratory effort normal.  BS equal and clear bilateral  without rales, rhonci, wheezing or stridor. Cardio: Heart sounds are normal with regular rate and rhythm and no murmurs, rubs or gallops. Peripheral pulses are normal and equal bilaterally without edema. No aortic or femoral bruits. Chest: symmetric with normal excursions and percussion.  Abdomen: Soft, with Nl bowel sounds. Nontender, no guarding, rebound, hernias, masses, or organomegaly.  Lymphatics: Non tender without lymphadenopathy.  Musculoskeletal: Full ROM all peripheral extremities, joint stability, 5/5 strength, and normal gait. Skin: Warm and dry without rashes, lesions, cyanosis, clubbing or  ecchymosis.  Neuro: Cranial nerves intact, reflexes equal bilaterally. Normal muscle tone, no cerebellar symptoms. Sensation intact to touch, vibratory and Monofilament to the toes bilaterally. Pysch: Alert and oriented x 3 with normal affect, insight and judgment appropriate.   Assessment and Plan  1. Annual Preventative/Screening Exam   2. Essential  hypertension  - EKG 12-Lead - US, RETROPERITNL ABD,  LTD - Urinalysis, Routine w reflex microscopic - Microalbumin / creatinine urine ratio - CBC with Differential/Platelet - COMPLETE METABOLIC PANEL WITH GFR - Magnesium - TSH  3. Hyperlipidemia, mixed  - EKG 12-Lead - US, RETROPERITNL ABD,  LTD - Lipid panel - TSH  4. Type 2 diabetes mellitus with stage 2 chronic kidney disease, without long-term current use of insulin ([name])  - EKG 12-Lead - US, RETROPERITNL ABD,  LTD - Urinalysis, Routine w reflex microscopic - Microalbumin / creatinine urine ratio - Hemoglobin A1c - Insulin, random  5. Vitamin D deficiency  - VITAMIN D 25 Hydroxyl  6. T2_NIDDM w/Peripheral Neuropathy  - EKG 12-Lead - US, RETROPERITNL ABD,  LTD - HM DIABETES FOOT EXAM - LOW EXTREMITY NEUR EXAM DOCUM - Hemoglobin A1c  7. Testosterone deficiency   8. Chronic obstructive pulmonary disease  ([name])   9. Morbid obesity (BMI 35)   10. Screening for colorectal cancer  - POC Hemoccult Bld/Stl   11. Prostate cancer screening  - PSA  12. Screening examination for pulmonary tuberculosis  - TB Skin Test  13. Screening for ischemic heart disease  - EKG 12-Lead  14. Smoker  - EKG 12-Lead - US, RETROPERITNL ABD,  LTD  15. FH: cardiovascular disease  - EKG 12-Lead - US, RETROPERITNL ABD,  LTD  16. Family history of cerebrovascular disease  - EKG 12-Lead - US, RETROPERITNL ABD,  LTD  17. Screening for AAA (aortic abdominal aneurysm)  - US, RETROPERITNL ABD,  LTD  18. Fatigue, unspecified type  - Iron,Total/Total Iron Binding Cap - Vitamin B12 - Testosterone  19. Medication management  - Urinalysis, Routine w reflex microscopic - Microalbumin / creatinine urine ratio - CBC with Differential/Platelet - COMPLETE METABOLIC PANEL WITH GFR - Magnesium - Lipid panel - TSH - Hemoglobin A1c - Insulin, random - VITAMIN D 25 Hydroxy      Patient was counseled in prudent diet,  weight control to achieve/maintain BMI less than 25, BP monitoring, regular exercise and medications as discussed.  Discussed med effects and SE's. Routine screening labs and tests as requested with regular follow-up as recommended. Over 40 minutes of exam, counseling, chart review and high complex critical decision making was performed   Marinus MawWilliam D Kristjan Derner, MD

## 2018-07-14 ENCOUNTER — Other Ambulatory Visit: Payer: Self-pay

## 2018-07-14 ENCOUNTER — Ambulatory Visit: Payer: 59 | Admitting: Internal Medicine

## 2018-07-14 VITALS — BP 138/78 | HR 84 | Temp 97.8°F | Resp 16 | Ht 74.5 in | Wt 279.2 lb

## 2018-07-14 DIAGNOSIS — E782 Mixed hyperlipidemia: Secondary | ICD-10-CM

## 2018-07-14 DIAGNOSIS — R350 Frequency of micturition: Secondary | ICD-10-CM

## 2018-07-14 DIAGNOSIS — Z13220 Encounter for screening for lipoid disorders: Secondary | ICD-10-CM

## 2018-07-14 DIAGNOSIS — Z79899 Other long term (current) drug therapy: Secondary | ICD-10-CM | POA: Diagnosis not present

## 2018-07-14 DIAGNOSIS — Z136 Encounter for screening for cardiovascular disorders: Secondary | ICD-10-CM

## 2018-07-14 DIAGNOSIS — E559 Vitamin D deficiency, unspecified: Secondary | ICD-10-CM

## 2018-07-14 DIAGNOSIS — E1140 Type 2 diabetes mellitus with diabetic neuropathy, unspecified: Secondary | ICD-10-CM

## 2018-07-14 DIAGNOSIS — E1122 Type 2 diabetes mellitus with diabetic chronic kidney disease: Secondary | ICD-10-CM

## 2018-07-14 DIAGNOSIS — N182 Chronic kidney disease, stage 2 (mild): Secondary | ICD-10-CM

## 2018-07-14 DIAGNOSIS — Z0000 Encounter for general adult medical examination without abnormal findings: Secondary | ICD-10-CM

## 2018-07-14 DIAGNOSIS — E1149 Type 2 diabetes mellitus with other diabetic neurological complication: Secondary | ICD-10-CM

## 2018-07-14 DIAGNOSIS — Z125 Encounter for screening for malignant neoplasm of prostate: Secondary | ICD-10-CM

## 2018-07-14 DIAGNOSIS — Z130 Encounter for screening for diseases of the blood and blood-forming organs and certain disorders involving the immune mechanism: Secondary | ICD-10-CM

## 2018-07-14 DIAGNOSIS — Z0001 Encounter for general adult medical examination with abnormal findings: Secondary | ICD-10-CM

## 2018-07-14 DIAGNOSIS — R5383 Other fatigue: Secondary | ICD-10-CM

## 2018-07-14 DIAGNOSIS — Z8249 Family history of ischemic heart disease and other diseases of the circulatory system: Secondary | ICD-10-CM

## 2018-07-14 DIAGNOSIS — Z1329 Encounter for screening for other suspected endocrine disorder: Secondary | ICD-10-CM | POA: Diagnosis not present

## 2018-07-14 DIAGNOSIS — Z1389 Encounter for screening for other disorder: Secondary | ICD-10-CM | POA: Diagnosis not present

## 2018-07-14 DIAGNOSIS — Z131 Encounter for screening for diabetes mellitus: Secondary | ICD-10-CM

## 2018-07-14 DIAGNOSIS — Z111 Encounter for screening for respiratory tuberculosis: Secondary | ICD-10-CM | POA: Diagnosis not present

## 2018-07-14 DIAGNOSIS — F17200 Nicotine dependence, unspecified, uncomplicated: Secondary | ICD-10-CM

## 2018-07-14 DIAGNOSIS — I10 Essential (primary) hypertension: Secondary | ICD-10-CM | POA: Diagnosis not present

## 2018-07-14 DIAGNOSIS — Z1212 Encounter for screening for malignant neoplasm of rectum: Secondary | ICD-10-CM

## 2018-07-14 DIAGNOSIS — N401 Enlarged prostate with lower urinary tract symptoms: Secondary | ICD-10-CM | POA: Diagnosis not present

## 2018-07-14 DIAGNOSIS — Z1211 Encounter for screening for malignant neoplasm of colon: Secondary | ICD-10-CM

## 2018-07-14 DIAGNOSIS — J449 Chronic obstructive pulmonary disease, unspecified: Secondary | ICD-10-CM

## 2018-07-14 DIAGNOSIS — E349 Endocrine disorder, unspecified: Secondary | ICD-10-CM

## 2018-07-14 NOTE — Patient Instructions (Addendum)
- Vit D  And Vit C 1,000 mg  are recommended to help protect  against the Covid_19 and other Corona viruses.   - Also it's recommended to take Zinc 50 mg to help  protect against the Covid_19  And best place to get  is also on [employer] and don't pay more than 6-8 cents /pill !   =============================== Coronavirus (COVID-19) Are you at risk?  Are you at risk for the Coronavirus (COVID-19)?  To be considered HIGH RISK for Coronavirus (COVID-19), you have to meet the following criteria:  . Traveled to [place], [place], [place], [place] or [place]; or in the [place] to [place], [place], [place]  . or [place]; and have fever, cough, and shortness of breath within the last 2 weeks of travel OR . Been in close contact with a person diagnosed with COVID-19 within the last 2 weeks and have  . fever, cough,and shortness of breath .  . IF YOU DO NOT MEET THESE CRITERIA, YOU ARE CONSIDERED LOW RISK FOR COVID-19.  What to do if you are HIGH RISK for COVID-19?  . If you are having a medical emergency, call 911. . Seek medical care right away. Before you go to a doctor's office, urgent care or emergency department, .  call ahead and tell them about your recent travel, contact with someone diagnosed with COVID-19  .  and your symptoms.  . You should receive instructions from your physician's office regarding next steps of care.  . When you arrive at healthcare provider, tell the healthcare staff immediately you have returned from  . visiting [place], [place], [place], [place] or [place]; or traveled in the [place] to [place], [place],  . [place] or [place] in the last two weeks or you have been in close contact with a person diagnosed with  . COVID-19 in the last 2 weeks.   . Tell the health care staff about your symptoms: fever, cough and shortness of breath. . After you have been seen by a medical provider, you will be either: o Tested for (COVID-19) and  discharged home on quarantine except to seek medical care if  o symptoms worsen, and asked to  - Stay home and avoid contact with others until you get your results (4-5 days)  - Avoid travel on public transportation if possible (such as bus, train, or airplane) or o Sent to the Emergency Department by EMS for evaluation, COVID-19 testing  and  o possible admission depending on your condition and test results.  What to do if you are LOW RISK for COVID-19?  Reduce your risk of any infection by using the same precautions used for avoiding the common cold or flu:  . Wash your hands often with soap and warm water for at least 20 seconds.  If soap and water are not readily available,  . use an alcohol-based hand sanitizer with at least 60% alcohol.  . If coughing or sneezing, cover your mouth and nose by coughing or sneezing into the elbow areas of your shirt or coat, .  into a tissue or into your sleeve (not your hands). . Avoid shaking hands with others and consider head nods or verbal greetings only. . Avoid touching your eyes, nose, or mouth with unwashed hands.  . Avoid close contact with people who are sick. . Avoid places or events with large numbers of people in one location, like concerts or sporting events. . Carefully consider travel plans   you have or are making. . If you are planning any travel outside or inside the [place], visit the CDC's Travelers' Health webpage for the latest health notices. . If you have some symptoms but not all symptoms, continue to monitor at home and seek medical attention  . if your symptoms worsen. . If you are having a medical emergency, call 911. >>>>>>>>>>>>>>>>>>>>>>>>>>>> Preventive Care for Adults  A healthy lifestyle and preventive care can promote health and wellness. Preventive health guidelines for men include the following key practices:  A routine yearly physical is a good way to check with your health care provider about your health and  preventative screening. It is a chance to share any concerns and updates on your health and to receive a thorough exam.  Visit your dentist for a routine exam and preventative care every 6 months. Brush your teeth twice a day and floss once a day. Good oral hygiene prevents tooth decay and gum disease.  The frequency of eye exams is based on your age, health, family medical history, use of contact lenses, and other factors. Follow your health care provider's recommendations for frequency of eye exams.  Eat a healthy diet. Foods such as vegetables, fruits, whole grains, low-fat dairy products, and lean protein foods contain the nutrients you need without too many calories. Decrease your intake of foods high in solid fats, added sugars, and salt. Eat the right amount of calories for you. Get information about a proper diet from your health care provider, if necessary.  Regular physical exercise is one of the most important things you can do for your health. Most adults should get at least 150 minutes of moderate-intensity exercise (any activity that increases your heart rate and causes you to sweat) each week. In addition, most adults need muscle-strengthening exercises on 2 or more days a week.  Maintain a healthy weight. The body mass index (BMI) is a screening tool to identify possible weight problems. It provides an estimate of body fat based on height and weight. Your health care provider can find your BMI and can help you achieve or maintain a healthy weight. For adults 20 years and older:  A BMI below 18.5 is considered underweight.  A BMI of 18.5 to 24.9 is normal.  A BMI of 25 to 29.9 is considered overweight.  A BMI of 30 and above is considered obese.  Maintain normal blood lipids and cholesterol levels by exercising and minimizing your intake of saturated fat. Eat a balanced diet with plenty of fruit and vegetables. Blood tests for lipids and cholesterol should begin at age 20 and be  repeated every 5 years. If your lipid or cholesterol levels are high, you are over 50, or you are at high risk for heart disease, you may need your cholesterol levels checked more frequently. Ongoing high lipid and cholesterol levels should be treated with medicines if diet and exercise are not working.  If you smoke, find out from your health care provider how to quit. If you do not use tobacco, do not start.  Lung cancer screening is recommended for adults aged 55-80 years who are at high risk for developing lung cancer because of a history of smoking. A yearly low-dose CT scan of the lungs is recommended for people who have at least a 30-pack-year history of smoking and are a current smoker or have quit within the past 15 years. A pack year of smoking is smoking an average of 1 pack of cigarettes a   day for 1 year (for example: 1 pack a day for 30 years or 2 packs a day for 15 years). Yearly screening should continue until the smoker has stopped smoking for at least 15 years. Yearly screening should be stopped for people who develop a health problem that would prevent them from having lung cancer treatment.  If you choose to drink alcohol, do not have more than 2 drinks per day. One drink is considered to be 12 ounces (355 mL) of beer, 5 ounces (148 mL) of wine, or 1.5 ounces (44 mL) of liquor.  Avoid use of street drugs. Do not share needles with anyone. Ask for help if you need support or instructions about stopping the use of drugs.  High blood pressure causes heart disease and increases the risk of stroke. Your blood pressure should be checked at least every 1-2 years. Ongoing high blood pressure should be treated with medicines, if weight loss and exercise are not effective.  If you are 45-79 years old, ask your health care provider if you should take aspirin to prevent heart disease.  Diabetes screening involves taking a blood sample to check your fasting blood sugar level. This should be done  once every 3 years, after age 45, if you are within normal weight and without risk factors for diabetes. Testing should be considered at a younger age or be carried out more frequently if you are overweight and have at least 1 risk factor for diabetes.  Colorectal cancer can be detected and often prevented. Most routine colorectal cancer screening begins at the age of 50 and continues through age 75. However, your health care provider may recommend screening at an earlier age if you have risk factors for colon cancer. On a yearly basis, your health care provider may provide home test kits to check for hidden blood in the stool. Use of a small camera at the end of a tube to directly examine the colon (sigmoidoscopy or colonoscopy) can detect the earliest forms of colorectal cancer. Talk to your health care provider about this at age 50, when routine screening begins. Direct exam of the colon should be repeated every 5-10 years through age 75, unless early forms of precancerous polyps or small growths are found.   Talk with your health care provider about prostate cancer screening.  Testicular cancer screening isrecommended for adult males. Screening includes self-exam, a health care provider exam, and other screening tests. Consult with your health care provider about any symptoms you have or any concerns you have about testicular cancer.  Use sunscreen. Apply sunscreen liberally and repeatedly throughout the day. You should seek shade when your shadow is shorter than you. Protect yourself by wearing long sleeves, pants, a wide-brimmed hat, and sunglasses year round, whenever you are outdoors.  Once a month, do a whole-body skin exam, using a mirror to look at the skin on your back. Tell your health care provider about new moles, moles that have irregular borders, moles that are larger than a pencil eraser, or moles that have changed in shape or color.  Stay current with required vaccines  (immunizations).  Influenza vaccine. All adults should be immunized every year.  Tetanus, diphtheria, and acellular pertussis (Td, Tdap) vaccine. An adult who has not previously received Tdap or who does not know his vaccine status should receive 1 dose of Tdap. This initial dose should be followed by tetanus and diphtheria toxoids (Td) booster doses every 10 years. Adults with an unknown or incomplete history   of completing a 3-dose immunization series with Td-containing vaccines should begin or complete a primary immunization series including a Tdap dose. Adults should receive a Td booster every 10 years.  Varicella vaccine. An adult without evidence of immunity to varicella should receive 2 doses or a second dose if he has previously received 1 dose.  Human papillomavirus (HPV) vaccine. Males aged 13-21 years who have not received the vaccine previously should receive the 3-dose series. Males aged 22-26 years may be immunized. Immunization is recommended through the age of 26 years for any male who has sex with males and did not get any or all doses earlier. Immunization is recommended for any person with an immunocompromised condition through the age of 26 years if he did not get any or all doses earlier. During the 3-dose series, the second dose should be obtained 4-8 weeks after the first dose. The third dose should be obtained 24 weeks after the first dose and 16 weeks after the second dose.  Zoster vaccine. One dose is recommended for adults aged 60 years or older unless certain conditions are present.    PREVNAR  - Pneumococcal 13-valent conjugate (PCV13) vaccine. When indicated, a person who is uncertain of his immunization history and has no record of immunization should receive the PCV13 vaccine. An adult aged 19 years or older who has certain medical conditions and has not been previously immunized should receive 1 dose of PCV13 vaccine. This PCV13 should be followed with a dose of  pneumococcal polysaccharide (PPSV23) vaccine. The PPSV23 vaccine dose should be obtained at least 1 r more year(s) after the dose of PCV13 vaccine. An adult aged 19 years or older who has certain medical conditions and previously received 1 or more doses of PPSV23 vaccine should receive 1 dose of PCV13. The PCV13 vaccine dose should be obtained 1 or more years after the last PPSV23 vaccine dose.    PNEUMOVAX - Pneumococcal polysaccharide (PPSV23) vaccine. When PCV13 is also indicated, PCV13 should be obtained first. All adults aged 65 years and older should be immunized. An adult younger than age 65 years who has certain medical conditions should be immunized. Any person who resides in a nursing home or long-term care facility should be immunized. An adult smoker should be immunized. People with an immunocompromised condition and certain other conditions should receive both PCV13 and PPSV23 vaccines. People with human immunodeficiency virus (HIV) infection should be immunized as soon as possible after diagnosis. Immunization during chemotherapy or radiation therapy should be avoided. Routine use of PPSV23 vaccine is not recommended for American Indians, [place] Natives, or people younger than 65 years unless there are medical conditions that require PPSV23 vaccine. When indicated, people who have unknown immunization and have no record of immunization should receive PPSV23 vaccine. One-time revaccination 5 years after the first dose of PPSV23 is recommended for people aged 19-64 years who have chronic kidney failure, nephrotic syndrome, asplenia, or immunocompromised conditions. People who received 1-2 doses of PPSV23 before age 65 years should receive another dose of PPSV23 vaccine at age 65 years or later if at least 5 years have passed since the previous dose. Doses of PPSV23 are not needed for people immunized with PPSV23 at or after age 65 years.    Hepatitis A vaccine. Adults who wish to be protected  from this disease, have certain high-risk conditions, work with hepatitis A-infected animals, work in hepatitis A research labs, or travel to or work in countries with a high rate of   hepatitis A should be immunized. Adults who were previously unvaccinated and who anticipate close contact with an international adoptee during the first 60 days after arrival in the [place] from a country with a high rate of hepatitis A should be immunized.    Hepatitis B vaccine. Adults should be immunized if they wish to be protected from this disease, have certain high-risk conditions, may be exposed to blood or other infectious body fluids, are household contacts or sex partners of hepatitis B positive people, are clients or workers in certain care facilities, or travel to or work in countries with a high rate of hepatitis B.   Preventive Service / Frequency   Ages 40 to 64  Blood pressure check.  Lipid and cholesterol check  Lung cancer screening. / Every year if you are aged 55-80 years and have a 30-pack-year history of smoking and currently smoke or have quit within the past 15 years. Yearly screening is stopped once you have quit smoking for at least 15 years or develop a health problem that would prevent you from having lung cancer treatment.  Fecal occult blood test (FOBT) of stool. / Every year beginning at age 50 and continuing until age 75. You may not have to do this test if you get a colonoscopy every 10 years.  Flexible sigmoidoscopy** or colonoscopy.** / Every 5 years for a flexible sigmoidoscopy or every 10 years for a colonoscopy beginning at age 50 and continuing until age 75. Screening for abdominal aortic aneurysm (AAA)  by ultrasound is recommended for people who have history of high blood pressure or who are current or former smokers. +++++++++++ Recommend Adult Low Dose Aspirin or  coated  Aspirin 81 mg daily  To reduce risk of Colon Cancer 40 %,  Skin Cancer 26 % ,  Malignant  Melanoma 46%  and  Pancreatic cancer 60% ++++++++++++++++++++ Vitamin D goal  is between 70-100.  Please make sure that you are taking your Vitamin D as directed.  It is very important as a natural anti-inflammatory  helping hair, skin, and nails, as well as reducing stroke and heart attack risk.  It helps your bones and helps with mood. It also decreases numerous cancer risks so please take it as directed.  Low Vit D is associated with a 200-300% higher risk for CANCER  and 200-300% higher risk for HEART   ATTACK  &  STROKE.   ...................................... It is also associated with higher death rate at younger ages,  autoimmune diseases like Rheumatoid arthritis, Lupus, Multiple Sclerosis.    Also many other serious conditions, like depression, Alzheimer's Dementia, infertility, muscle aches, fatigue, fibromyalgia - just to name a few. +++++++++++++++++++++ Recommend the book "The END of DIETING" by Dr Joel Fuhrman  & the book "The END of DIABETES " by Dr Joel Fuhrman At [employer] - get book & Audio CD's    Being diabetic has a  300% increased risk for heart attack, stroke, cancer, and alzheimer- type vascular dementia. It is very important that you work harder with diet by avoiding all foods that are white. Avoid white rice (brown & wild rice is OK), white potatoes (sweetpotatoes in moderation is OK), White bread or wheat bread or anything made out of white flour like bagels, donuts, rolls, buns, biscuits, cakes, pastries, cookies, pizza crust, and pasta (made from white flour & egg whites) - vegetarian pasta or spinach or wheat pasta is OK. Multigrain breads like Arnold's or [employer], or multigrain sandwich thins   or flatbreads.  Diet, exercise and weight loss can reverse and cure diabetes in the early stages.  Diet, exercise and weight loss is very important in the control and prevention of complications of diabetes which affects every system in your body, ie. Brain -  dementia/stroke, eyes - glaucoma/blindness, heart - heart attack/heart failure, kidneys - dialysis, stomach - gastric paralysis, intestines - malabsorption, nerves - severe painful neuritis, circulation - gangrene & loss of a leg(s), and finally cancer and Alzheimers.    I recommend avoid fried & greasy foods,  sweets/candy, white rice (brown or wild rice or Quinoa is OK), white potatoes (sweet potatoes are OK) - anything made from white flour - bagels, doughnuts, rolls, buns, biscuits,white and wheat breads, pizza crust and traditional pasta made of white flour & egg white(vegetarian pasta or spinach or wheat pasta is OK).  Multi-grain bread is OK - like multi-grain flat bread or sandwich thins. Avoid alcohol in excess. Exercise is also important.    Eat all the vegetables you want - avoid meat, especially red meat and dairy - especially cheese.  Cheese is the most concentrated form of trans-fats which is the worst thing to clog up our arteries. Veggie cheese is OK which can be found in the fresh produce section at [employer] or [employer] or [employer]  ++++++++++++++++++++++ DASH Eating Plan  DASH stands for "Dietary Approaches to Stop Hypertension."   The DASH eating plan is a healthy eating plan that has been shown to reduce high blood pressure (hypertension). Additional health benefits may include reducing the risk of type 2 diabetes mellitus, heart disease, and stroke. The DASH eating plan may also help with weight loss. WHAT DO I NEED TO KNOW ABOUT THE DASH EATING PLAN? For the DASH eating plan, you will follow these general guidelines:  Choose foods with a percent daily value for sodium of less than 5% (as listed on the food label).  Use salt-free seasonings or herbs instead of table salt or sea salt.  Check with your health care provider or pharmacist before using salt substitutes.  Eat lower-sodium products, often labeled as "lower sodium" or "no salt added."  Eat fresh  foods.  Eat more vegetables, fruits, and low-fat dairy products.  Choose whole grains. Look for the word "whole" as the first word in the ingredient list.  Choose fish   Limit sweets, desserts, sugars, and sugary drinks.  Choose heart-healthy fats.  Eat veggie cheese   Eat more home-cooked food and less restaurant, buffet, and fast food.  Limit fried foods.  Cook foods using methods other than frying.  Limit canned vegetables. If you do use them, rinse them well to decrease the sodium.  When eating at a restaurant, ask that your food be prepared with less salt, or no salt if possible.                      WHAT FOODS CAN I EAT? Read Dr Joel Fuhrman's books on The End of Dieting & The End of Diabetes  Grains Whole grain or whole wheat bread. Brown rice. Whole grain or whole wheat pasta. Quinoa, bulgur, and whole grain cereals. Low-sodium cereals. Corn or whole wheat flour tortillas. Whole grain cornbread. Whole grain crackers. Low-sodium crackers.  Vegetables Fresh or frozen vegetables (raw, steamed, roasted, or grilled). Low-sodium or reduced-sodium tomato and vegetable juices. Low-sodium or reduced-sodium tomato sauce and paste. Low-sodium or reduced-sodium canned vegetables.   Fruits All fresh, canned (in natural juice), or   frozen fruits.  Protein Products  All fish and seafood.  Dried beans, peas, or lentils. Unsalted nuts and seeds. Unsalted canned beans.  Dairy Low-fat dairy products, such as skim or 1% milk, 2% or reduced-fat cheeses, low-fat ricotta or cottage cheese, or plain low-fat yogurt. Low-sodium or reduced-sodium cheeses.  Fats and Oils Tub margarines without trans fats. Light or reduced-fat mayonnaise and salad dressings (reduced sodium). Avocado. Safflower, olive, or canola oils. Natural peanut or almond butter.  Other Unsalted popcorn and pretzels. The items listed above may not be a complete list of recommended foods or beverages. Contact your  dietitian for more options.  +++++++++++++++++++  WHAT FOODS ARE NOT RECOMMENDED? Grains/ White flour or wheat flour White bread. White pasta. White rice. Refined cornbread. Bagels and croissants. Crackers that contain trans fat.  Vegetables  Creamed or fried vegetables. Vegetables in a . Regular canned vegetables. Regular canned tomato sauce and paste. Regular tomato and vegetable juices.  Fruits Dried fruits. Canned fruit in light or heavy syrup. Fruit juice.  Meat and Other Protein Products Meat in general - RED meat & White meat.  Fatty cuts of meat. Ribs, chicken wings, all processed meats as bacon, sausage, bologna, salami, fatback, hot dogs, bratwurst and packaged luncheon meats.  Dairy Whole or 2% milk, cream, half-and-half, and cream cheese. Whole-fat or sweetened yogurt. Full-fat cheeses or blue cheese. Non-dairy creamers and whipped toppings. Processed cheese, cheese spreads, or cheese curds.  Condiments Onion and garlic salt, seasoned salt, table salt, and sea salt. Canned and packaged gravies. Worcestershire sauce. Tartar sauce. Barbecue sauce. Teriyaki sauce. Soy sauce, including reduced sodium. Steak sauce. Fish sauce. Oyster sauce. Cocktail sauce. Horseradish. Ketchup and mustard. Meat flavorings and tenderizers. Bouillon cubes. Hot sauce. Tabasco sauce. Marinades. Taco seasonings. Relishes.  Fats and Oils Butter, stick margarine, lard, shortening and bacon fat. Coconut, palm kernel, or palm oils. Regular salad dressings.  Pickles and olives. Salted popcorn and pretzels.  The items listed above may not be a complete list of foods and beverages to avoid.    

## 2018-07-15 ENCOUNTER — Other Ambulatory Visit: Payer: Self-pay | Admitting: Internal Medicine

## 2018-07-15 DIAGNOSIS — E1122 Type 2 diabetes mellitus with diabetic chronic kidney disease: Secondary | ICD-10-CM

## 2018-07-15 DIAGNOSIS — N182 Chronic kidney disease, stage 2 (mild): Secondary | ICD-10-CM

## 2018-07-15 LAB — CBC WITH DIFFERENTIAL/PLATELET
Absolute Monocytes: 986 cells/uL — ABNORMAL HIGH (ref 200–950)
Basophils Absolute: 90 cells/uL (ref 0–200)
Basophils Relative: 0.8 %
Eosinophils Absolute: 134 cells/uL (ref 15–500)
Eosinophils Relative: 1.2 %
HCT: 45.2 % (ref 38.5–50.0)
Hemoglobin: 15.8 g/dL (ref 13.2–17.1)
Lymphs Abs: 4066 cells/uL — ABNORMAL HIGH (ref 850–3900)
MCH: 33.1 pg — ABNORMAL HIGH (ref 27.0–33.0)
MCHC: 35 g/dL (ref 32.0–36.0)
MCV: 94.8 fL (ref 80.0–100.0)
MPV: 12.8 fL — ABNORMAL HIGH (ref 7.5–12.5)
Monocytes Relative: 8.8 %
Neutro Abs: 5925 cells/uL (ref 1500–7800)
Neutrophils Relative %: 52.9 %
Platelets: 191 10*3/uL (ref 140–400)
RBC: 4.77 10*6/uL (ref 4.20–5.80)
RDW: 12.8 % (ref 11.0–15.0)
Total Lymphocyte: 36.3 %
WBC: 11.2 10*3/uL — ABNORMAL HIGH (ref 3.8–10.8)

## 2018-07-15 LAB — HEMOGLOBIN A1C
Hgb A1c MFr Bld: 9.3 % of total Hgb — ABNORMAL HIGH (ref <5.7)
Mean Plasma Glucose: 220 (calc)
eAG (mmol/L): 12.2 (calc)

## 2018-07-15 LAB — COMPLETE METABOLIC PANEL WITH GFR
AG Ratio: 1.4 (calc) (ref 1.0–2.5)
ALT: 62 U/L — ABNORMAL HIGH (ref 9–46)
AST: 35 U/L (ref 10–35)
Albumin: 4.2 g/dL (ref 3.6–5.1)
Alkaline phosphatase (APISO): 73 U/L (ref 35–144)
BUN/Creatinine Ratio: 33 (calc) — ABNORMAL HIGH (ref 6–22)
BUN: 28 mg/dL — ABNORMAL HIGH (ref 7–25)
CO2: 22 mmol/L (ref 20–32)
Calcium: 9.9 mg/dL (ref 8.6–10.3)
Chloride: 101 mmol/L (ref 98–110)
Creat: 0.86 mg/dL (ref 0.70–1.33)
GFR, Est African American: 110 mL/min/{1.73_m2} (ref >=60)
GFR, Est Non African American: 95 mL/min/{1.73_m2} (ref >=60)
Globulin: 2.9 g/dL (calc) (ref 1.9–3.7)
Glucose, Bld: 171 mg/dL — ABNORMAL HIGH (ref 65–99)
Potassium: 4.5 mmol/L (ref 3.5–5.3)
Sodium: 134 mmol/L — ABNORMAL LOW (ref 135–146)
Total Bilirubin: 0.4 mg/dL (ref 0.2–1.2)
Total Protein: 7.1 g/dL (ref 6.1–8.1)

## 2018-07-15 LAB — TESTOSTERONE: Testosterone: 127 ng/dL — ABNORMAL LOW (ref 250–827)

## 2018-07-15 LAB — LIPID PANEL
Cholesterol: 214 mg/dL — ABNORMAL HIGH (ref <200)
HDL: 29 mg/dL — ABNORMAL LOW (ref >=40)
Non-HDL Cholesterol (Calc): 185 mg/dL (calc) — ABNORMAL HIGH (ref <130)
Total CHOL/HDL Ratio: 7.4 (calc) — ABNORMAL HIGH (ref <5.0)
Triglycerides: 432 mg/dL — ABNORMAL HIGH (ref <150)

## 2018-07-15 LAB — PSA: PSA: 1.5 ng/mL (ref <=4.0)

## 2018-07-15 LAB — VITAMIN B12: Vitamin B-12: 443 pg/mL (ref 200–1100)

## 2018-07-15 LAB — INSULIN, RANDOM: Insulin: 48.6 u[IU]/mL — ABNORMAL HIGH

## 2018-07-15 LAB — MAGNESIUM: Magnesium: 1.8 mg/dL (ref 1.5–2.5)

## 2018-07-15 LAB — IRON,?TOTAL/TOTAL IRON BINDING CAP: Iron: 71 ug/dL (ref 50–180)

## 2018-07-15 LAB — IRON, TOTAL/TOTAL IRON BINDING CAP
%SAT: 24 % (calc) (ref 20–48)
TIBC: 300 mcg/dL (calc) (ref 250–425)

## 2018-07-15 LAB — VITAMIN D 25 HYDROXY (VIT D DEFICIENCY, FRACTURES): Vit D, 25-Hydroxy: 40 ng/mL (ref 30–100)

## 2018-07-15 LAB — TSH: TSH: 1.77 mIU/L (ref 0.40–4.50)

## 2018-07-15 MED ORDER — GLIPIZIDE 5 MG PO TABS: ORAL_TABLET | ORAL | 1 refills | Status: DC

## 2018-07-16 LAB — URINALYSIS, ROUTINE W REFLEX MICROSCOPIC
Bilirubin Urine: NEGATIVE
Hgb urine dipstick: NEGATIVE
Leukocytes,Ua: NEGATIVE
Nitrite: NEGATIVE
Protein, ur: NEGATIVE
Specific Gravity, Urine: 1.024 (ref 1.001–1.03)
pH: <=5 (ref 5.0–8.0)

## 2018-07-16 LAB — MICROALBUMIN / CREATININE URINE RATIO
Creatinine, Urine: 183 mg/dL (ref 20–320)
Microalb Creat Ratio: 7 mcg/mg creat (ref <30)
Microalb, Ur: 1.3 mg/dL

## 2018-07-19 ENCOUNTER — Other Ambulatory Visit: Payer: Self-pay | Admitting: Internal Medicine

## 2018-07-20 LAB — TB SKIN TEST
Induration: 0 mm
TB Skin Test: NEGATIVE

## 2018-10-06 ENCOUNTER — Other Ambulatory Visit: Payer: Self-pay | Admitting: Internal Medicine

## 2018-10-06 ENCOUNTER — Other Ambulatory Visit: Payer: Self-pay | Admitting: Physician Assistant

## 2018-10-06 DIAGNOSIS — E1122 Type 2 diabetes mellitus with diabetic chronic kidney disease: Secondary | ICD-10-CM

## 2018-10-20 NOTE — Progress Notes (Signed)
FOLLOW UP  Assessment and Plan:   Hypertension Well controlled with current medications  Monitor blood pressure at home; patient to call if consistently greater than 130/80 Continue DASH diet.   Reminder to go to the ER if any CP, SOB, nausea, dizziness, severe HA, changes vision/speech, left arm numbness and tingling and jaw pain.  Cholesterol Currently above goal of LDL <70;  Could not tolerate 20 mg crestor 3 days a week- may start zetia or 5 mg daily crestor pending LDL- information given to the patient Continue low cholesterol diet and exercise.  Check lipid panel.   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  Perform daily foot/skin check, notify office of any concerning changes.  Check A1C  Obesity with co morbidities Long discussion about weight loss, diet, and exercise Recommended diet heavy in fruits and veggies and low in animal meats, cheeses, and dairy products, appropriate calorie intake Discussed ideal weight for height Will follow up in 3 months  Vitamin D Def At goal at last visit; continue supplementation to maintain goal of 70-100  Tobacco use Discussed risks associated with tobacco use and advised to reduce or quit Patient is not ready to do so, but advised to consider strongly Will follow up at the next visit  COPD Suggest low dose CT or CXR Advised to stop smoking - risks discussed. Declines medications.  Currently stable without respiratory medications   Continue diet and meds as discussed. Further disposition pending results of labs. Discussed med's effects and SE's.   Over 30 minutes of exam, counseling, chart review, and critical decision making was performed.   Future Appointments  Date Time Provider Department Center  01/20/2019  4:30  PM Lucky Cowboy, MD [facility] None  07/20/2019  3:45 PM Lucky Cowboy, MD [facility] None    ----------------------------------------------------------------------------------------------------------------------  HPI 58 y.o. male  presents for 3 month follow up on hypertension, cholesterol, diabetes, weight and vitamin D deficiency.  he currently continues to smoke 1 pack a day x 45 years; discussed risks associated with smoking, patient is not ready to quit. last CXR was 09/2016.   BMI is Body mass index is 34.84 kg/m., he has been working on diet and exercise. Does watch portions, walks around his office building on 10 min breaks Wt Readings from Last 3 Encounters:  10/21/18 275 lb (124.7 kg)  07/14/18 279 lb 3.2 oz (126.6 kg)  02/12/18 283 lb (128.4 kg)   His blood pressure has been controlled at home, today their BP is BP: 122/88  He does not workout. He denies chest pain, shortness of breath, dizziness.   He is on cholesterol medication (has been prescribed crestor 20 mg daily, but hasn't been taking)  and denies myalgias. His cholesterol is not at goal. The cholesterol last visit was:   Lab Results  Component Value Date   CHOL 214 (H) 07/14/2018   HDL 29 (L) 07/14/2018   LDLCALC  07/14/2018     Comment:     . LDL cholesterol not calculated. Triglyceride levels greater than 400 mg/dL invalidate calculated LDL results. . Reference range: <100 . Desirable range <100 mg/dL for primary prevention;   <70 mg/dL for patients with CHD or diabetic patients  with > or = 2 CHD risk factors. Marland Kitchen LDL-C is now calculated using the Martin-Hopkins  calculation, which is a validated  novel method providing  better accuracy than the Friedewald equation in the  estimation of LDL-C.  Cresenciano Genre et al. Annamaria Helling. 0539;767(34): 2061-2068  (http://education.[employer].com/faq/FAQ164)    TRIG 432 (H) 07/14/2018   CHOLHDL 7.4 (H) 07/14/2018    He has been working on diet and exercise  for T2 diabetes  Neuropathy on gabapentin 1 am and 2 at night- states hands and feet Hyperlipidemia- stopped crestor 20 mg 3 days a week due to muscle aches.  ACE no CKD On Glipizide twice a day, no hypoglycemia, off metformin.  denies foot ulcerations, increased appetite, nausea, polydipsia, polyuria, visual disturbances, vomiting and weight loss.  He does not currently check sugars, he does not have a machine.  Last A1C in the office was:  Lab Results  Component Value Date   HGBA1C 9.3 (H) 07/14/2018     Lab Results  Component Value Date   GFRNONAA 95 07/14/2018    Patient is on Vitamin D supplement and at goal at recent check:    Lab Results  Component Value Date   VD25OH 40 07/14/2018        Current Medications:  Current Outpatient Medications on File Prior to Visit  Medication Sig  . aspirin 81 MG tablet Take 81 mg by mouth daily.  . Cholecalciferol 50000 units TABS Take 1 tablet by mouth 2 (two) times a week.   . citalopram (CELEXA) 40 MG tablet Take 1 tablet Daily for Mood  . gabapentin (NEURONTIN) 800 MG tablet Take 1/2  to 1 tablet 3 to 4 x /day as needed for Diabetic Neuropathy  . glipiZIDE (GLUCOTROL) 5 MG tablet Take 1 tablet 3 x /day with Meals for Diabetes  . hydrochlorothiazide (HYDRODIURIL) 25 MG tablet TAKE 1 TABLET BY MOUTH ONCE DAILY  . lisinopril (ZESTRIL) 20 MG tablet Take 1 tablet Daily for BP & Diabetic Kidney Protection   No current facility-administered medications on file prior to visit.      Allergies:  Allergies  Allergen Reactions  . Invokana [Canagliflozin]     Extremity edema/caused pain  . Codeine Camsylate [Codeine] Rash  . Morphine And Related Rash     Medical History:  Past Medical History:  Diagnosis Date  . Diabetic neuropathy ([place])   . History of hepatitis C   . History of kidney stones   . Hyperlipidemia   . Hypertension   . Other testicular hypofunction   . Type II or unspecified type diabetes mellitus without mention  of complication, not stated as uncontrolled   . Vitamin D deficiency    Family history- Reviewed and unchanged Social history- Reviewed and unchanged   Review of Systems:  Review of Systems  Constitutional: Negative for malaise/fatigue and weight loss.  HENT: Negative for hearing loss and tinnitus.   Eyes: Negative for blurred vision and double vision.  Respiratory: Negative for cough, shortness of breath and wheezing.   Cardiovascular: Negative for chest pain, palpitations, orthopnea, claudication and leg swelling.  Gastrointestinal: Negative for abdominal pain, blood in stool, constipation, diarrhea, heartburn, melena, nausea and vomiting.  Genitourinary: Negative.   Musculoskeletal: Negative for joint pain and myalgias.  Skin: Negative for rash.  Neurological: Negative for dizziness, tingling, sensory change, weakness and headaches.  Endo/Heme/Allergies: Negative for polydipsia.  Psychiatric/Behavioral: Negative for depression, hallucinations, memory loss and substance abuse. The patient is not nervous/anxious.   All other systems reviewed and are negative.     Physical Exam: BP 122/88   Pulse 66   Temp (!) 97 F (36.1  C)   Ht 6' 2.5" (1.892 m)   Wt 275 lb (124.7 kg)   SpO2 98%   BMI 34.84 kg/m  Wt Readings from Last 3 Encounters:  10/21/18 275 lb (124.7 kg)  07/14/18 279 lb 3.2 oz (126.6 kg)  02/12/18 283 lb (128.4 kg)   General Appearance: Well nourished, in no apparent distress. Eyes: PERRLA, EOMs, conjunctiva no swelling or erythema Sinuses: No Frontal/maxillary tenderness ENT/Mouth: Ext aud canals clear, TMs without erythema, bulging. No erythema, swelling, or exudate on post pharynx.  Tonsils not swollen or erythematous. Hearing normal.  Neck: Supple, thyroid normal.  Respiratory: Respiratory effort normal, BS equal bilaterally with diffuse wheezing without rales, rhonchi,  or stridor.  Cardio: RRR with no MRGs. Brisk peripheral pulses without edema.  Abdomen:  Soft, + BS.  Non tender, no guarding, rebound, hernias, masses. Lymphatics: Non tender without lymphadenopathy.  Musculoskeletal: Full ROM, 5/5 strength, Normal gait Skin: Warm, dry without rashes, lesions, ecchymosis.  Neuro: Cranial nerves intact. No cerebellar symptoms.  Psych: Awake and oriented X 3, normal affect, Insight and Judgment appropriate.    Quentin MullingAmanda Paul Torpey, PA-C 4:06 PM [facility]

## 2018-10-21 ENCOUNTER — Encounter: Payer: Self-pay | Admitting: Physician Assistant

## 2018-10-21 ENCOUNTER — Ambulatory Visit: Payer: Managed Care, Other (non HMO) | Admitting: Physician Assistant

## 2018-10-21 ENCOUNTER — Other Ambulatory Visit: Payer: Self-pay

## 2018-10-21 VITALS — BP 122/88 | HR 66 | Temp 97.0°F | Ht 74.5 in | Wt 275.0 lb

## 2018-10-21 DIAGNOSIS — Z79899 Other long term (current) drug therapy: Secondary | ICD-10-CM

## 2018-10-21 DIAGNOSIS — E538 Deficiency of other specified B group vitamins: Secondary | ICD-10-CM | POA: Diagnosis not present

## 2018-10-21 DIAGNOSIS — N182 Chronic kidney disease, stage 2 (mild): Secondary | ICD-10-CM

## 2018-10-21 DIAGNOSIS — E1122 Type 2 diabetes mellitus with diabetic chronic kidney disease: Secondary | ICD-10-CM

## 2018-10-21 DIAGNOSIS — E559 Vitamin D deficiency, unspecified: Secondary | ICD-10-CM | POA: Diagnosis not present

## 2018-10-21 DIAGNOSIS — J449 Chronic obstructive pulmonary disease, unspecified: Secondary | ICD-10-CM | POA: Diagnosis not present

## 2018-10-21 DIAGNOSIS — E1140 Type 2 diabetes mellitus with diabetic neuropathy, unspecified: Secondary | ICD-10-CM

## 2018-10-21 DIAGNOSIS — E1169 Type 2 diabetes mellitus with other specified complication: Secondary | ICD-10-CM | POA: Diagnosis not present

## 2018-10-21 DIAGNOSIS — E785 Hyperlipidemia, unspecified: Secondary | ICD-10-CM | POA: Diagnosis not present

## 2018-10-21 DIAGNOSIS — I10 Essential (primary) hypertension: Secondary | ICD-10-CM | POA: Diagnosis not present

## 2018-10-21 DIAGNOSIS — E349 Endocrine disorder, unspecified: Secondary | ICD-10-CM

## 2018-10-21 NOTE — Patient Instructions (Signed)
  Bad carbs also include fruit juice, alcohol, and sweet tea. These are empty calories that do not signal to your brain that you are full.   Please remember the good carbs are still carbs which convert into sugar. So please measure them out no more than 1/2-1 cup of rice, oatmeal, pasta, and beans  Veggies are however free foods! Pile them on.   Not all fruit is created equal. Please see the list below, the fruit at the bottom is higher in sugars than the fruit at the top. Please avoid all dried fruits.     Diabetes is a very complicated disease...lets simplify it.  An easy way to look at it to understand the complications is if you think of the extra sugar floating in your blood stream as glass shards floating through your blood stream.    Diabetes affects your small vessels first: 1) The glass shards (sugar) scraps down the tiny blood vessels in your eyes and lead to diabetic retinopathy, the leading cause of blindness in the [place]. Diabetes is the leading cause of newly diagnosed adult (20 to 60 years of age) blindness in the [place].  2) The glass shards scratches down the tiny vessels of your legs leading to nerve damage called neuropathy and can lead to amputations of your feet. More than 60% of all non-traumatic amputations of lower limbs occur in people with diabetes.  3) Over time the small vessels in your brain are shredded and closed off, individually this does not cause any problems but over a long period of time many of the small vessels being blocked can lead to Vascular Dementia.   4) Your kidney's are a filter system and have a "net" that keeps certain things in the body and lets bad things out. Sugar shreds this net and leads to kidney damage and eventually failure. Decreasing the sugar that is destroying the net and certain blood pressure medications can help stop or decrease progression of kidney disease. Diabetes was the primary cause of kidney failure in 44 percent of  all new cases in 2011.  5) Diabetes also destroys the small vessels in your penis that lead to erectile dysfunction. Eventually the vessels are so damaged that you may not be responsive to cialis or viagra.   Diabetes and your large vessels: Your larger vessels consist of your coronary arteries in your heart and the carotid vessels to your brain. Diabetes or even increased sugars put you at 300% increased risk of heart attack and stroke and this is why.. The sugar scrapes down your large blood vessels and your body sees this as an internal injury and tries to repair itself. Just like you get a scab on your skin, your platelets will stick to the blood vessel wall trying to heal it. This is why we have diabetics on low dose aspirin daily, this prevents the platelets from sticking and can prevent plaque formation. In addition, your body takes cholesterol and tries to shove it into the open wound. This is why we want your LDL, or bad cholesterol, below 70.   The combination of platelets and cholesterol over 5-10 years forms plaque that can break off and cause a heart attack or stroke.   PLEASE REMEMBER:  Diabetes is preventable! Up to 85 percent of complications and morbidities among individuals with type 2 diabetes can be prevented, delayed, or effectively treated and minimized with regular visits to a health professional, appropriate monitoring and medication, and a healthy diet   and lifestyle.  Your LDL is not in range or at goal, goal is less than 70.  Your LDL is the bad cholesterol that can lead to heart attack and stroke. To lower your number you can decrease your fatty foods, red meat, cheese, milk and increase fiber like whole grains and veggies. You can also add a fiber supplement like Citracel or Benefiber, these do not cause gas and bloating and are safe to use. Since you have risk factors that make [place] want your number below 70, we need to adjust or add medications to get your number below goal.     Lung Cancer Screening A lung cancer screening is a test that checks for lung cancer. Lung cancer screening is done to look for lung cancer in its very early stages, before it spreads and becomes harder to treat and before symptoms appear. Finding cancer early improves the chances of successful treatment. It may save your life. Should I be screened for lung cancer? You should be screened for lung cancer if all of these apply:  You currently smoke or you have quit smoking within the past 15 years.  You are 40-76 years old. Screening may be recommended up to age 64 depending on your overall health and other factors.  You are in good general health.  You have a smoking history of 1 pack a day for 30 years or 2 packs a day for 15 years. Screening may also be recommended if you are at high risk for the disease. You may be at high risk if:  You have a family history of lung cancer.  You have been exposed to asbestos.  You have chronic obstructive pulmonary disease (COPD).  You have a history of previous lung cancer. How often should I be screened for lung cancer?  If you are at risk for lung cancer, it is recommended that you are screened once a year. The recommended screening test is a low-dose CT scan. How can I lower my risk of lung cancer? To lower your risk of developing lung cancer:  If you smoke, stop smoking all tobacco products.  Avoid secondhand smoke.  Avoid exposure to radiation.  Avoid exposure to radon gas. Have your home checked for radon regularly.  Avoid things that cause cancer (carcinogens).  Avoid living or working in places with high air pollution. Where to find more information Ask your health care provider about the risks and benefits of screening. More information and resources are available from these organizations:  [place] (ACS): www.cancer.org  American Lung Association: www.lung.org Contact a health care provider if:  You start  to show symptoms of lung cancer, including: ? Coughing that will not go away. ? Wheezing. ? Chest pain. ? Coughing up blood. ? Shortness of breath. ? Weight loss that cannot be explained. ? Constant fatigue. Summary  Lung cancer screening may find lung cancer before symptoms appear. Finding cancer early improves the chances of successful treatment. It may save your life.  If you are at risk for lung cancer, it is recommended that you are screened once a year. The recommended screening test is a low-dose CT scan.  You can make lifestyle changes to lower your risk of lung cancer.  Ask your health care provider about the risks and benefits of screening. This information is not intended to replace advice given to you by your health care provider. Make sure you discuss any questions you have with your health care provider. Document Released: 11/29/2015  Document Revised: 05/01/2018 Document Reviewed: 11/29/2015 Elsevier Patient Education  2020 [employer].

## 2018-10-22 LAB — CBC WITH DIFFERENTIAL/PLATELET
Absolute Monocytes: 883 cells/uL (ref 200–950)
Basophils Absolute: 58 cells/uL (ref 0–200)
Basophils Relative: 0.6 %
Eosinophils Absolute: 107 cells/uL (ref 15–500)
Eosinophils Relative: 1.1 %
HCT: 46.6 % (ref 38.5–50.0)
Hemoglobin: 15.8 g/dL (ref 13.2–17.1)
Lymphs Abs: 3162 cells/uL (ref 850–3900)
MCH: 33.1 pg — ABNORMAL HIGH (ref 27.0–33.0)
MCHC: 33.9 g/dL (ref 32.0–36.0)
MCV: 97.5 fL (ref 80.0–100.0)
MPV: 12.6 fL — ABNORMAL HIGH (ref 7.5–12.5)
Monocytes Relative: 9.1 %
Neutro Abs: 5490 cells/uL (ref 1500–7800)
Neutrophils Relative %: 56.6 %
Platelets: 176 10*3/uL (ref 140–400)
RBC: 4.78 10*6/uL (ref 4.20–5.80)
RDW: 12.2 % (ref 11.0–15.0)
Total Lymphocyte: 32.6 %
WBC: 9.7 10*3/uL (ref 3.8–10.8)

## 2018-10-22 LAB — LIPID PANEL
Cholesterol: 213 mg/dL — ABNORMAL HIGH (ref <200)
HDL: 26 mg/dL — ABNORMAL LOW (ref >=40)
LDL Cholesterol (Calc): 132 mg/dL (calc) — ABNORMAL HIGH
Non-HDL Cholesterol (Calc): 187 mg/dL (calc) — ABNORMAL HIGH (ref <130)
Total CHOL/HDL Ratio: 8.2 (calc) — ABNORMAL HIGH (ref <5.0)
Triglycerides: 385 mg/dL — ABNORMAL HIGH (ref <150)

## 2018-10-22 LAB — COMPLETE METABOLIC PANEL WITH GFR
AG Ratio: 1.4 (calc) (ref 1.0–2.5)
ALT: 63 U/L — ABNORMAL HIGH (ref 9–46)
AST: 40 U/L — ABNORMAL HIGH (ref 10–35)
Albumin: 4.1 g/dL (ref 3.6–5.1)
Alkaline phosphatase (APISO): 70 U/L (ref 35–144)
BUN: 25 mg/dL (ref 7–25)
CO2: 28 mmol/L (ref 20–32)
Calcium: 9.5 mg/dL (ref 8.6–10.3)
Chloride: 98 mmol/L (ref 98–110)
Creat: 1.01 mg/dL (ref 0.70–1.33)
GFR, Est African American: 94 mL/min/{1.73_m2} (ref >=60)
GFR, Est Non African American: 81 mL/min/{1.73_m2} (ref >=60)
Globulin: 3 g/dL (calc) (ref 1.9–3.7)
Glucose, Bld: 402 mg/dL — ABNORMAL HIGH (ref 65–99)
Potassium: 4.4 mmol/L (ref 3.5–5.3)
Sodium: 134 mmol/L — ABNORMAL LOW (ref 135–146)
Total Bilirubin: 0.5 mg/dL (ref 0.2–1.2)
Total Protein: 7.1 g/dL (ref 6.1–8.1)

## 2018-10-22 LAB — VITAMIN B12: Vitamin B-12: 564 pg/mL (ref 200–1100)

## 2018-10-22 LAB — HEMOGLOBIN A1C
Hgb A1c MFr Bld: 9.5 % of total Hgb — ABNORMAL HIGH (ref <5.7)
Mean Plasma Glucose: 226 (calc)
eAG (mmol/L): 12.5 (calc)

## 2018-10-22 LAB — VITAMIN D 25 HYDROXY (VIT D DEFICIENCY, FRACTURES): Vit D, 25-Hydroxy: 64 ng/mL (ref 30–100)

## 2018-10-22 LAB — MAGNESIUM: Magnesium: 2.4 mg/dL (ref 1.5–2.5)

## 2018-10-22 LAB — TSH: TSH: 1.34 mIU/L (ref 0.40–4.50)

## 2018-10-27 ENCOUNTER — Other Ambulatory Visit: Payer: Self-pay

## 2018-10-27 MED ORDER — LISINOPRIL 20 MG PO TABS: ORAL_TABLET | ORAL | 0 refills | Status: DC

## 2018-11-23 ENCOUNTER — Other Ambulatory Visit: Payer: Self-pay

## 2018-11-23 ENCOUNTER — Ambulatory Visit: Payer: Managed Care, Other (non HMO) | Admitting: Adult Health

## 2018-11-23 ENCOUNTER — Ambulatory Visit
Admission: RE | Admit: 2018-11-23 | Discharge: 2018-11-23 | Disposition: A | Discharge status: Home / Self Care | Payer: Managed Care, Other (non HMO) | Source: Ambulatory Visit | Attending: Adult Health | Admitting: Adult Health

## 2018-11-23 ENCOUNTER — Encounter: Payer: Self-pay | Admitting: Adult Health

## 2018-11-23 VITALS — BP 138/76 | HR 73 | Temp 97.2°F | Ht 74.5 in | Wt 269.2 lb

## 2018-11-23 DIAGNOSIS — W19XXXA Unspecified fall, initial encounter: Secondary | ICD-10-CM

## 2018-11-23 DIAGNOSIS — M25512 Pain in left shoulder: Secondary | ICD-10-CM | POA: Diagnosis not present

## 2018-11-23 MED ORDER — MELOXICAM 15 MG PO TABS: ORAL_TABLET | ORAL | 1 refills | Status: DC

## 2018-11-23 MED ORDER — TRAMADOL HCL 50 MG PO TABS: ORAL_TABLET | ORAL | 0 refills | Status: DC

## 2018-11-23 NOTE — Progress Notes (Signed)
Assessment and Plan:  [name] was seen today for shoulder pain and fall.  Diagnoses and all orders for this visit:  Acute pain of left shoulder Acute following fall; severe pain and reduced active and passive ROM; will obtain xray to r/o fracture though suspect soft tissue injury; pain and swelling limiting exam to characterize further Pain medication as prescribed below; PDMP reviewed for tramadol; opioid cautions reviewed and provided on AVS Place shoulder in sling 2 weeks until follow up;  No lifting in the interim, minimal active ROM Will plan to refer to ortho at next visit if persistent edema, weakness, reduced ROM or sooner if severe symptoms -     DG Shoulder Left; Future -     meloxicam (MOBIC) 15 MG tablet; Take one daily with food for 2 weeks, can take with tylenol, can not take with aleve, iburpofen, then as needed daily for pain -     traMADol (ULTRAM) 50 MG tablet; 1 tablet every 6 hours for severe pain as needed. Do not take with alcohol or sedating medications. Do not drive while taking this medication.  Fall, initial encounter Mechanical; concerning for L shoulder injury; otherwise normal exam without concern for head or neck injury at this time.    Further disposition pending results of labs. Discussed med's effects and SE's.   Over 15 minutes of exam, counseling, chart review, and critical decision making was performed.   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   He reports was on his roof blowing leaves and slipped and fell backwards to the roof; he denies falling from roof, hitting head, denies neck pain or back pain; he doesn't recall catching himself with left extremity. He denies immediate shoulder pain but with rapid onset of left anterior shoulder pain and stiffness, has been unable to move shoulder much at all. He describes pain as with movement only, mainly with lateral abduction but also with any active unassisted movement. Denies any radiation, describes as sharp, generalized, describes as 8/10 with movement. Hasn't tried anything other than rest.   Remote hx of L collar bone fracture in 80s. He is not recently established with ortho.   Past Medical History:  Diagnosis Date  . Diabetic neuropathy ([name])   . History of hepatitis C   . History of kidney stones   . Hyperlipidemia   . Hypertension   . Other testicular hypofunction   . Type II or unspecified type diabetes mellitus without mention of complication, not stated as uncontrolled   . Vitamin D deficiency      Allergies  Allergen Reactions  . Invokana [Canagliflozin]     Extremity edema/caused pain  . Codeine Camsylate [Codeine] Rash  . Morphine And Related Rash    Current Outpatient Medications on File Prior to Visit  Medication Sig  . aspirin 81 MG tablet Take 81 mg by mouth daily.  . Cholecalciferol 50000 units TABS Take  1 tablet by mouth 2 (two) times a week.   . citalopram (CELEXA) 40 MG tablet Take 1 tablet Daily for Mood  . gabapentin (NEURONTIN) 800 MG tablet Take 1/2  to 1 tablet 3 to 4 x /day as needed for Diabetic Neuropathy  . glipiZIDE (GLUCOTROL) 5 MG tablet Take 1 tablet 3 x /day with Meals for Diabetes  . hydrochlorothiazide (HYDRODIURIL) 25 MG tablet TAKE 1 TABLET BY MOUTH ONCE DAILY  . lisinopril (ZESTRIL) 20 MG tablet Take 1 tablet Daily for BP & Diabetic Kidney Protection   No current  facility-administered medications on file prior to visit.     ROS: all negative except above.   Physical Exam:  BP 138/76   Pulse 73   Temp (!) 97.2 F (36.2 C)   Ht 6' 2.5" (1.892 m)   Wt 269 lb 3.2 oz (122.1 kg)   SpO2 97%   BMI 34.10 kg/m   General Appearance: Well nourished, in no apparent distress. Eyes: PERRLA, conjunctiva no swelling or erythema ENT/Mouth: Hearing normal.  Neck: Supple, non-tender.  Respiratory: Respiratory effort normal, BS equal bilaterally without rales, rhonchi, wheezing or stridor.  Cardio: RRR with no MRGs. Brisk peripheral pulses without edema.  Lymphatics: Non tender without lymphadenopathy.  Musculoskeletal: Very limited ROM L shoulder, no obvious dislocation or palpable displacement, generalized swelling and tenderness through anterior and lateral shoulder, minimal active ROM, severe pain with passive adduction, flexion, internal rotation; grip strength intact/symmetrical, normal gait. No clavicular tenderness or displacement;  Skin: Warm, dry without rashes, lesions, ecchymosis.  Neuro: Normal muscle tone, Sensation intact.  Psych: Awake and oriented X 3, normal affect, Insight and Judgment appropriate.     Izora Ribas, NP 12:00 PM [facility]

## 2018-11-23 NOTE — Patient Instructions (Addendum)
Please wear a sling while we are waiting on xray results  Can try applying ice  Avoid any unneeded movement, NO lifting until you follow up  Meloxicam 1 tab daily - take with food or milk  Can take tylenol if needed - no ibuprofen or aleve  Tramadol only if needed for severe pain  Follow up in 2 weeks   Shoulder Pain Many things can cause shoulder pain, including:  An injury to the shoulder.  Overuse of the shoulder.  Arthritis. The source of the pain can be:  Inflammation.  An injury to the shoulder joint.  An injury to a tendon, ligament, or bone. Follow these instructions at home: Pay attention to changes in your symptoms. Let your health care provider know about them. Follow these instructions to relieve your pain. If you have a sling:  Wear the sling as told by your health care provider. Remove it only as told by your health care provider.  Loosen the sling if your fingers tingle, become numb, or turn cold and blue.  Keep the sling clean.  If the sling is not waterproof: ? Do not let it get wet. Remove it to shower or bathe.  Move your arm as little as possible, but keep your hand moving to prevent swelling. Managing pain, stiffness, and swelling   If directed, put ice on the painful area: ? Put ice in a plastic bag. ? Place a towel between your skin and the bag. ? Leave the ice on for 20 minutes, 2-3 times per day. Stop applying ice if it does not help with the pain.  Squeeze a soft ball or a foam pad as much as possible. This helps to keep the shoulder from swelling. It also helps to strengthen the arm. General instructions  Take over-the-counter and prescription medicines only as told by your health care provider.  Keep all follow-up visits as told by your health care provider. This is important. Contact a health care provider if:  Your pain gets worse.  Your pain is not relieved with medicines.  New pain develops in your arm, hand, or  fingers. Get help right away if:  Your arm, hand, or fingers: ? Tingle. ? Become numb. ? Become swollen. ? Become painful. ? Turn white or blue. Summary  Shoulder pain can be caused by an injury, overuse, or arthritis.  Pay attention to changes in your symptoms. Let your health care provider know about them.  This condition may be treated with a sling, ice, and pain medicines.  Contact your health care provider if the pain gets worse or new pain develops. Get help right away if your arm, hand, or fingers tingle or become numb, swollen, or painful.  Keep all follow-up visits as told by your health care provider. This is important. This information is not intended to replace advice given to you by your health care provider. Make sure you discuss any questions you have with your health care provider. Document Released: 10/17/2004 Document Revised: 07/22/2017 Document Reviewed: 07/22/2017 Elsevier Patient Education  2020 Else[employer]  Meloxicam capsules What is this medicine? MELOXICAM (mel OX i cam) is a non-steroidal anti-inflammatory drug (NSAID). It is used to reduce swelling and to treat pain. It is used for osteoarthritis. This medicine may be used for other purposes; ask your health care provider or pharmacist if you have questions. COMMON BRAND NAME(S): Vivlodex What should I tell my health care provider before I take this medicine? They need to know if  you have any of these conditions:  bleeding disorders  cigarette smoker  coronary artery bypass graft (CABG) surgery within the past 2 weeks  drink more than 3 alcohol-containing drinks per day  heart disease  high blood pressure  history of stomach bleeding  kidney disease  liver disease  lung or breathing disease, like asthma  stomach or intestine problems  an unusual or allergic reaction to meloxicam, aspirin, other NSAIDs, other medicines, foods, dyes, or preservatives  pregnant or trying to get  pregnant  breast-feeding How should I use this medicine? Take this medicine by mouth with a full glass of water. Follow the directions on the prescription label. You can take it with or without food. If it upsets your stomach, take it with food. Take your medicine at regular intervals. Do not take it more often than directed. Do not stop taking except on your doctor's advice. A special MedGuide will be given to you by the pharmacist with each prescription and refill. Be sure to read this information carefully each time. Talk to your pediatrician regarding the use of this medicine in children. Special care may be needed. Patients over 47 years old may have a stronger reaction and need a smaller dose. Overdosage: If you think you have taken too much of this medicine contact a poison control center or emergency room at once. NOTE: This medicine is only for you. Do not share this medicine with others. What if I miss a dose? If you miss a dose, take it as soon as you can. If it is almost time for your next dose, take only that dose. Do not take double or extra doses. What may interact with this medicine? Do not take this medicine with any of the following medications:  cidofovir  ketorolac This medicine may also interact with the following medications:  aspirin and aspirin-like medicines  certain medicines for blood pressure, heart disease, irregular heart beat  certain medicines for depression, anxiety, or psychotic disturbances  certain medicines that treat or prevent blood clots like warfarin, enoxaparin, dalteparin, apixaban, dabigatran, rivaroxaban  cyclosporine  diuretics  fluconazole  lithium  methotrexate  other NSAIDs, medicines for pain and inflammation, like ibuprofen and naproxen  pemetrexed This list may not describe all possible interactions. Give your health care provider a list of all the medicines, herbs, non-prescription drugs, or dietary supplements you use.  Also tell them if you smoke, drink alcohol, or use illegal drugs. Some items may interact with your medicine. What should I watch for while using this medicine? Tell your doctor or healthcare provider if your symptoms do not start to get better or if they get worse. This medicine may cause serious skin reactions. They can happen weeks to months after starting the medicine. Contact your healthcare provider right away if you notice fevers or flu-like symptoms with a rash. The rash may be red or purple and then turn into blisters or peeling of the skin. Or, you might notice a red rash with swelling of the face, lips or lymph nodes in your neck or under your arms. Do not take other medicines that contain aspirin, ibuprofen, or naproxen with this medicine. Side effects such as stomach upset, nausea, or ulcers may be more likely to occur. Many medicines available without a prescription should not be taken with this medicine. This medicine can cause ulcers and bleeding in the stomach and intestines at any time during treatment. This can happen with no warning and may cause death. There is  increased risk with taking this medicine for a long time. Smoking, drinking alcohol, older age, and poor health can also increase risks. Call your doctor right away if you have stomach pain or blood in your vomit or stool. This medicine does not prevent heart attack or stroke. In fact, this medicine may increase the chance of a heart attack or stroke. The chance may increase with longer use of this medicine and in people who have heart disease. If you take aspirin to prevent heart attack or stroke, talk with your doctor or healthcare provider. What side effects may I notice from receiving this medicine? Side effects that you should report to your doctor or health care professional as soon as possible:  allergic reactions like skin rash, itching or hives, swelling of the face, lips, or tongue  nausea, vomiting  redness,  blistering, peeling, or loosening of the skin, including inside the mouth  signs and symptoms of a blood clot such as breathing problems; changes in vision; chest pain; severe, sudden headache; pain, swelling, warmth in the leg; trouble speaking; sudden numbness or weakness of the face, arm, or leg  signs and symptoms of bleeding such as bloody or black, tarry stools; red or dark-brown urine; spitting up blood or brown material that looks like coffee grounds; red spots on the skin; unusual bruising or bleeding from the eye, gums, or nose  signs and symptoms of liver injury like dark yellow or brown urine; general ill feeling or flu-like symptoms; light-colored stools; loss of appetite; nausea; right upper belly pain; unusually weak or tired; yellowing of the eyes or skin  signs and symptoms of stroke like changes in vision; confusion; trouble speaking or understanding; severe headaches; sudden numbness or weakness of the face, arm, or leg; trouble walking; dizziness; loss of balance or coordination Side effects that usually do not require medical attention (report to your doctor or health care professional if they continue or are bothersome):  constipation  diarrhea  gas This list may not describe all possible side effects. Call your doctor for medical advice about side effects. You may report side effects to FDA at 1-800-FDA-1088. Where should I keep my medicine? Keep out of the reach of children. Store at room temperature between 15 and 30 degrees C (59 and 86 degrees F). Throw away any unused medicine after the expiration date. NOTE: This sheet is a summary. It may not cover all possible information. If you have questions about this medicine, talk to your doctor, pharmacist, or health care provider.  2020 Elsevier/Gold Standard (2018-04-08 11:19:03)    Tramadol tablets What is this medicine? TRAMADOL (TRA ma dole) is a pain reliever. It is used to treat moderate to severe pain in  adults. This medicine may be used for other purposes; ask your health care provider or pharmacist if you have questions. COMMON BRAND NAME(S): Ultram What should I tell my health care provider before I take this medicine? They need to know if you have any of these conditions:  brain tumor  depression  drug abuse or addiction  head injury  if you frequently drink alcohol containing drinks  kidney disease or trouble passing urine  liver disease  lung disease, asthma, or breathing problems  seizures or epilepsy  suicidal thoughts, plans, or attempt; a previous suicide attempt by you or a family member  an unusual or allergic reaction to tramadol, codeine, other medicines, foods, dyes, or preservatives  pregnant or trying to get pregnant  breast-feeding How should I  use this medicine? Take this medicine by mouth with a full glass of water. Follow the directions on the prescription label. You can take it with or without food. If it upsets your stomach, take it with food. Do not take your medicine more often than directed. A special MedGuide will be given to you by the pharmacist with each prescription and refill. Be sure to read this information carefully each time. Talk to your pediatrician regarding the use of this medicine in children. Special care may be needed. Overdosage: If you think you have taken too much of this medicine contact a poison control center or emergency room at once. NOTE: This medicine is only for you. Do not share this medicine with others. What if I miss a dose? If you miss a dose, take it as soon as you can. If it is almost time for your next dose, take only that dose. Do not take double or extra doses. What may interact with this medicine? Do not take this medication with any of the following medicines:  MAOIs like Carbex, Eldepryl, Marplan, Nardil, and Parnate This medicine may also interact with the following medications:  alcohol  antihistamines  for allergy, cough and cold  certain medicines for anxiety or sleep  certain medicines for depression like amitriptyline, fluoxetine, sertraline  certain medicines for migraine headache like almotriptan, eletriptan, frovatriptan, naratriptan, rizatriptan, sumatriptan, zolmitriptan  certain medicines for seizures like carbamazepine, oxcarbazepine, phenobarbital, primidone  certain medicines that treat or prevent blood clots like warfarin  digoxin  furazolidone  general anesthetics like halothane, isoflurane, methoxyflurane, propofol  linezolid  local anesthetics like lidocaine, pramoxine, tetracaine  medicines that relax muscles for surgery  other narcotic medicines for pain or cough  phenothiazines like chlorpromazine, mesoridazine, prochlorperazine, thioridazine  procarbazine This list may not describe all possible interactions. Give your health care provider a list of all the medicines, herbs, non-prescription drugs, or dietary supplements you use. Also tell them if you smoke, drink alcohol, or use illegal drugs. Some items may interact with your medicine. What should I watch for while using this medicine? Tell your doctor or health care provider if your pain does not go away, if it gets worse, or if you have new or a different type of pain. You may develop tolerance to the medicine. Tolerance means that you will need a higher dose of the medicine for pain relief. Tolerance is normal and is expected if you take this medicine for a long time. This medicine may cause serious skin reactions. They can happen weeks to months after starting the medicine. Contact your health care provider right away if you notice fevers or flu-like symptoms with a rash. The rash may be red or purple and then turn into blisters or peeling of the skin. Or, you might notice a red rash with swelling of the face, lips or lymph nodes in your neck or under your arms. Do not suddenly stop taking your medicine  because you may develop a severe reaction. Your body becomes used to the medicine. This does NOT mean you are addicted. Addiction is a behavior related to getting and using a drug for a non-medical reason. If you have pain, you have a medical reason to take pain medicine. Your doctor will tell you how much medicine to take. If your doctor wants you to stop the medicine, the dose will be slowly lowered over time to avoid any side effects. There are different types of narcotic medicines (opiates). If you take more  than one type at the same time or if you are taking another medicine that also causes drowsiness, you may have more side effects. Give your health care provider a list of all medicines you use. Your doctor will tell you how much medicine to take. Do not take more medicine than directed. Call emergency for help if you have problems breathing or unusual sleepiness. You may get drowsy or dizzy. Do not drive, use machinery, or do anything that needs mental alertness until you know how this medicine affects you. Do not stand or sit up quickly, especially if you are an older patient. This reduces the risk of dizzy or fainting spells. Alcohol can increase or decrease the effects of this medicine. Avoid alcoholic drinks. You may have constipation. Try to have a bowel movement at least every 2 to 3 days. If you do not have a bowel movement for 3 days, call your doctor or health care provider. Your mouth may get dry. Chewing sugarless gum or sucking hard candy, and drinking plenty of water may help. Contact your doctor if the problem does not go away or is severe. What side effects may I notice from receiving this medicine? Side effects that you should report to your doctor or health care professional as soon as possible:  allergic reactions like skin rash, itching or hives, swelling of the face, lips, or tongue  breathing problems  confusion  redness, blistering, peeling or loosening of the skin,  including inside the mouth  seizures  signs and symptoms of low blood pressure like dizziness; feeling faint or lightheaded, falls; unusually weak or tired  trouble passing urine or change in the amount of urine Side effects that usually do not require medical attention (report to your doctor or health care professional if they continue or are bothersome):  constipation  dry mouth  nausea, vomiting  tiredness This list may not describe all possible side effects. Call your doctor for medical advice about side effects. You may report side effects to FDA at 1-800-FDA-1088. Where should I keep my medicine? Keep out of the reach of children. This medicine may cause accidental overdose and death if it taken by other adults, children, or pets. Mix any unused medicine with a substance like cat litter or coffee grounds. Then throw the medicine away in a sealed container like a sealed bag or a coffee can with a lid. Do not use the medicine after the expiration date. Store at room temperature between 15 and 30 degrees C (59 and 86 degrees F). NOTE: This sheet is a summary. It may not cover all possible information. If you have questions about this medicine, talk to your doctor, pharmacist, or health care provider.  2020 Elsevier/Gold Standard (2018-04-17 15:56:48)

## 2018-12-03 NOTE — Progress Notes (Deleted)
Assessment and Plan:  [name] was seen today for shoulder pain and fall.  Diagnoses and all orders for this visit:  Acute pain of left shoulder Acute following fall; severe pain and reduced active and passive ROM; will obtain xray to r/o fracture though suspect soft tissue injury; pain and swelling limiting exam to characterize further Pain medication as prescribed below; PDMP reviewed for tramadol; opioid cautions reviewed and provided on AVS Place shoulder in sling 2 weeks until follow up;  No lifting in the interim, minimal active ROM Will plan to refer to ortho at next visit if persistent edema, weakness, reduced ROM or sooner if severe symptoms -     DG Shoulder Left; Future -     meloxicam (MOBIC) 15 MG tablet; Take one daily with food for 2 weeks, can take with tylenol, can not take with aleve, iburpofen, then as needed daily for pain -     traMADol (ULTRAM) 50 MG tablet; 1 tablet every 6 hours for severe pain as needed. Do not take with alcohol or sedating medications. Do not drive while taking this medication.  Fall, initial encounter Mechanical; concerning for L shoulder injury; otherwise normal exam without concern for head or neck injury at this time.    Further disposition pending results of labs. Discussed med's effects and SE's.   Over 15 minutes of exam, counseling, chart review, and critical decision making was performed.   Future Appointments  Date Time Provider Department Center  12/07/2018  2:30 PM Judd Gaudier, NP [facility] None  01/20/2019  4:30 PM Lucky Cowboy, MD [facility] None  07/20/2019  4:00 PM Lucky Cowboy, MD [facility] None    ------------------------------------------------------------------------------------------------------------------   HPI There were no vitals taken for this visit.  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   He reports  was on his roof blowing leaves and slipped and fell backwards to the roof; he denies falling from roof, hitting head, denies neck pain or back pain; he doesn't recall catching himself with left extremity. He denies immediate shoulder pain but with rapid onset of left anterior shoulder pain and stiffness.  He described pain as with movement only, mainly with lateral abduction but also with any active unassisted movement. ROM significantly limited by pain and swelling at that time; xray on 11/23/2018 showed  no evidence of acute fracture or dislocation. Old healed distal clavicle diaphyseal fracture. Moderate arthrosis of the AC joint. Soft tissues are unremarkable.  He was placed in a sling and advised rest, sling; was prescribed mobic 15 mg daily, tramadol 50 mg for severe pain ***    Remote hx of L collar bone fracture in 80s. He is not recently established with ortho.   Past Medical History:  Diagnosis Date  . Diabetic neuropathy ([name])   . History of hepatitis C   . History of kidney stones   . Hyperlipidemia   . Hypertension   . Other testicular hypofunction   . Type II or unspecified type diabetes mellitus without mention of complication, not stated as uncontrolled   . Vitamin D deficiency      Allergies  Allergen Reactions  . Invokana [Canagliflozin]     Extremity edema/caused pain  . Codeine Camsylate [Codeine] Rash  . Morphine And Related Rash    Current Outpatient Medications on File Prior to Visit  Medication Sig  . aspirin 81 MG tablet Take 81  mg by mouth daily.  . Cholecalciferol 50000 units TABS Take 1 tablet by mouth 2 (two) times a week.   . citalopram (CELEXA) 40 MG tablet Take 1 tablet Daily for Mood  . gabapentin (NEURONTIN) 800 MG tablet Take 1/2  to 1 tablet 3 to 4 x /day as needed for Diabetic Neuropathy  . glipiZIDE (GLUCOTROL) 5 MG tablet Take 1 tablet 3 x /day with Meals for Diabetes  . hydrochlorothiazide (HYDRODIURIL) 25 MG tablet TAKE 1 TABLET BY MOUTH ONCE  DAILY  . lisinopril (ZESTRIL) 20 MG tablet Take 1 tablet Daily for BP & Diabetic Kidney Protection  . meloxicam (MOBIC) 15 MG tablet Take one daily with food for 2 weeks, can take with tylenol, can not take with aleve, iburpofen, then as needed daily for pain  . traMADol (ULTRAM) 50 MG tablet 1 tablet every 6 hours for severe pain as needed. Do not take with alcohol or sedating medications. Do not drive while taking this medication.   No current facility-administered medications on file prior to visit.     ROS: all negative except above.   Physical Exam:  There were no vitals taken for this visit.  General Appearance: Well nourished, in no apparent distress. Eyes: PERRLA, conjunctiva no swelling or erythema ENT/Mouth: Hearing normal.  Neck: Supple, non-tender.  Respiratory: Respiratory effort normal, BS equal bilaterally without rales, rhonchi, wheezing or stridor.  Cardio: RRR with no MRGs. Brisk peripheral pulses without edema.  Lymphatics: Non tender without lymphadenopathy.  Musculoskeletal: Very limited ROM L shoulder, no obvious dislocation or palpable displacement, generalized swelling and tenderness through anterior and lateral shoulder, minimal active ROM, severe pain with passive adduction, flexion, internal rotation; grip strength intact/symmetrical, normal gait. No clavicular tenderness or displacement;  Skin: Warm, dry without rashes, lesions, ecchymosis.  Neuro: Normal muscle tone, Sensation intact.  Psych: Awake and oriented X 3, normal affect, Insight and Judgment appropriate.     Izora Ribas, NP 3:52 PM [facility]

## 2018-12-07 ENCOUNTER — Ambulatory Visit: Payer: Managed Care, Other (non HMO) | Admitting: Adult Health

## 2018-12-16 ENCOUNTER — Telehealth: Payer: Self-pay

## 2018-12-16 NOTE — Telephone Encounter (Signed)
Patient states that he is much better & he has returned to work after being out of work for a week. He does state that he needs the TRAMADOL for after work he still has a little pain/stiffness that tramadol helps with plus he can get some sleep.  He states another 5 days would be very helpful. Mr. [name] also states that since he is improving he would like to hold off on the referral for right now.

## 2018-12-16 NOTE — Telephone Encounter (Signed)
-----   Message from Liane Comber, NP sent at 12/15/2018  5:01 PM EST ----- Regarding: RE: med request Contact: [phone] Please advise patient I can only write a 5 day supply of tramadol for new pain (the 20 tabs that he finished) due to this being a controlled substance and new national opioid laws. I cannot refill this medication for him by law. This was discussed with him when originally written and we planned to follow up to reevaluate or refer to ortho for further evaluation if persistent. Please ask what he'd like to do and how his symptoms are doing since I law saw him. I CAN send in more of meloxicam if needed. Thanks! ----- Message ----- From: Elenor Quinones, CMA Sent: 12/15/2018  12:45 PM EST To: Liane Comber, NP Subject: med request                                    PER PT/YELLOW NOTE:  Refill on TRAMADOL Please & thank you!  Pharmacy: [facility]

## 2018-12-21 NOTE — Telephone Encounter (Signed)
UNABLE TO REACH PATIENT

## 2019-01-20 ENCOUNTER — Other Ambulatory Visit: Payer: Self-pay

## 2019-01-20 ENCOUNTER — Ambulatory Visit: Payer: Managed Care, Other (non HMO) | Admitting: Internal Medicine

## 2019-01-20 ENCOUNTER — Encounter: Payer: Self-pay | Admitting: Internal Medicine

## 2019-01-20 VITALS — BP 118/70 | HR 80 | Temp 97.6°F | Resp 18 | Ht 74.5 in | Wt 267.8 lb

## 2019-01-20 DIAGNOSIS — N182 Chronic kidney disease, stage 2 (mild): Secondary | ICD-10-CM

## 2019-01-20 DIAGNOSIS — E1122 Type 2 diabetes mellitus with diabetic chronic kidney disease: Secondary | ICD-10-CM | POA: Diagnosis not present

## 2019-01-20 DIAGNOSIS — I10 Essential (primary) hypertension: Secondary | ICD-10-CM

## 2019-01-20 DIAGNOSIS — E785 Hyperlipidemia, unspecified: Secondary | ICD-10-CM

## 2019-01-20 DIAGNOSIS — E559 Vitamin D deficiency, unspecified: Secondary | ICD-10-CM | POA: Diagnosis not present

## 2019-01-20 DIAGNOSIS — E1140 Type 2 diabetes mellitus with diabetic neuropathy, unspecified: Secondary | ICD-10-CM | POA: Diagnosis not present

## 2019-01-20 DIAGNOSIS — Z79899 Other long term (current) drug therapy: Secondary | ICD-10-CM

## 2019-01-20 DIAGNOSIS — E1169 Type 2 diabetes mellitus with other specified complication: Secondary | ICD-10-CM

## 2019-01-20 DIAGNOSIS — E1149 Type 2 diabetes mellitus with other diabetic neurological complication: Secondary | ICD-10-CM

## 2019-01-20 MED ORDER — FLUCONAZOLE 150 MG PO TABS: ORAL_TABLET | ORAL | 2 refills | Status: DC

## 2019-01-20 MED ORDER — METFORMIN HCL ER 500 MG PO TB24: ORAL_TABLET | ORAL | 2 refills | Status: DC

## 2019-01-20 MED ORDER — CEPHALEXIN 500 MG PO CAPS: ORAL_CAPSULE | ORAL | 0 refills | Status: DC

## 2019-01-20 NOTE — Progress Notes (Signed)
History of Present Illness:      This very nice 58 y.o.  MWMpresents for 6 month follow up with HTN, HLD, T2_NIDDM and Vitamin D Deficiency.       Patient is treated for HTN (1990) & BP has been controlled at home. Today's BP is at goal - 118/70. Patient has had no complaints of any cardiac type chest pain, palpitations, dyspnea / orthopnea / PND, dizziness, claudication, or dependent edema.      Hyperlipidemia is not controlled with diet & he had not started his prescribed Crestor. Patient denies myalgias or other med SE's. 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  Last A1c was not at goal:  Lab Results  Component Value Date   HGBA1C 9.5 (H) 10/21/2018        Further, the patient also has history of Vitamin D Deficiency ("24" / 2008)  and supplements vitamin D without any suspected side-effects. Last vitamin D was at goal:  Lab Results  Component Value Date   VD25OH 64 10/21/2018    Current Outpatient Medications on File Prior to Visit  Medication Sig  . aspirin 81 MG tablet Take 81 mg by mouth daily.  . Cholecalciferol 50000 units TABS Take 1 tablet by mouth 2 (two) times a week.   . citalopram (CELEXA) 40 MG tablet Take 1 tablet Daily for Mood  . gabapentin (NEURONTIN) 800 MG tablet Take 1/2  to 1 tablet 3 to 4 x /day as needed for Diabetic Neuropathy  . glipiZIDE (GLUCOTROL) 5 MG tablet Take 1 tablet 3 x /day with Meals for Diabetes  . hydrochlorothiazide (HYDRODIURIL) 25 MG tablet TAKE 1 TABLET BY MOUTH ONCE DAILY  . lisinopril (ZESTRIL) 20 MG tablet Take 1 tablet  Daily for BP & Diabetic Kidney Protection  . meloxicam (MOBIC) 15 MG tablet Take one daily with food for 2 weeks, can take with tylenol, can not take with aleve, iburpofen, then as needed daily for pain   No current facility-administered medications on file prior to visit.    Allergies  Allergen Reactions  . Invokana [Canagliflozin]     Extremity edema/caused pain  . Codeine Camsylate [Codeine] Rash  . Morphine And Related Rash    PMHx:   Past Medical History:  Diagnosis Date  . Diabetic neuropathy ([place])   . History of hepatitis C   . History of kidney stones   . Hyperlipidemia   . Hypertension   . Other testicular hypofunction   . Type II or unspecified type diabetes mellitus without mention of complication, not stated as uncontrolled   . Vitamin D deficiency    Immunization History  Administered Date(s) Administered  . PPD Test 08/31/2013, 11/08/2014, 04/03/2017, 07/14/2018  . Td 01/21/2005  . Tdap 12/25/2016   Past Surgical History:  Procedure Laterality Date  . CHOLECYSTECTOMY  1987  . ORIF TIBIA FRACTURE      FHx:    Reviewed / unchanged  SHx:    Reviewed / unchanged   Systems  Review:  Constitutional: Denies fever, chills, wt changes, headaches, insomnia, fatigue, night sweats, change in appetite. Eyes: Denies redness, blurred vision, diplopia, discharge, itchy, watery eyes.  ENT: Denies discharge, congestion, post nasal drip, epistaxis, sore throat, earache, hearing loss, dental pain, tinnitus, vertigo, sinus pain, snoring.  CV: Denies chest pain, palpitations, irregular heartbeat, syncope, dyspnea, diaphoresis, orthopnea, PND, claudication or edema. Respiratory: denies cough, dyspnea, DOE, pleurisy, hoarseness, laryngitis, wheezing.  Gastrointestinal: Denies dysphagia, odynophagia, heartburn, reflux, water brash, abdominal pain or cramps, nausea, vomiting, bloating, diarrhea, constipation, hematemesis, melena, hematochezia  or hemorrhoids. Genitourinary:  Denies dysuria, frequency, urgency, nocturia, hesitancy, discharge, hematuria or flank pain. Musculoskeletal: Denies arthralgias, myalgias, stiffness, jt. swelling, pain, limping or strain/sprain.  Skin: Denies pruritus, rash, hives, warts, acne, eczema or change in skin lesion(s). Neuro: No weakness, tremor, incoordination, spasms, paresthesia or pain. Psychiatric: Denies confusion, memory loss or sensory loss. Endo: Denies change in weight, skin or hair change.  Heme/Lymph: No excessive bleeding, bruising or enlarged lymph nodes.  Physical Exam  BP 118/70   Pulse 80   Temp 97.6 F (36.4 C)   Resp 18   Ht 6' 2.5" (1.892 m)   Wt 267 lb 12.8 oz (121.5 kg)   BMI 33.92 kg/m   Appears  Over nourished   and in no distress.  Eyes: PERRLA, EOMs, conjunctiva no swelling or erythema. Sinuses: No frontal/maxillary tenderness ENT/Mouth: EAC's clear, TM's nl w/o erythema, bulging. Nares clear w/o erythema, swelling, exudates. Oropharynx clear without erythema or exudates. Oral hygiene is good. Tongue normal, non obstructing. Hearing intact.  Neck: Supple. Thyroid not palpable. Car 2+/2+ without bruits, nodes or JVD. Chest: Respirations nl with BS clear & equal w/o rales, rhonchi, wheezing or stridor.  Cor: Heart sounds normal w/ regular rate and rhythm without sig. murmurs, gallops, clicks or rubs. Peripheral pulses normal and equal  without edema.  Abdomen: Soft & bowel sounds normal. Non-tender w/o guarding, rebound, hernias, masses or organomegaly.  Lymphatics: Unremarkable.  Musculoskeletal: Full ROM all peripheral extremities, joint stability, 5/5 strength and normal gait.  Skin: Warm, dry without exposed rashes, lesions or ecchymosis apparent.  Neuro: Cranial nerves intact, reflexes equal bilaterally. Sensory decreased in a stocking distribution. Motor testing grossly intact. Tendon reflexes grossly intact.  Pysch: Alert & oriented x 3.  Insight and judgement nl & appropriate. No  ideations.  Assessment and Plan:  1. Essential hypertension  - Continue medication, monitor blood pressure at home.  - Continue DASH diet.  Reminder to go to the ER if any CP,  SOB, nausea, dizziness, severe HA, changes vision/speech.  - CBC with Diff - COMPLETE METABOLIC PANEL WITH GFR - Magnesium - TSH  2. Hyperlipidemia associated with type 2 diabetes mellitus ([name])  - Continue diet/meds, exercise,& lifestyle modifications.  - Continue monitor periodic cholesterol/liver & renal functions   - Lipid Profile - TSH  3. Type 2 diabetes mellitus with stage 2 chronic kidney disease,  without long-term current use of insulin ([name])  - Continue diet, exercise  - Lifestyle modifications.  - Monitor appropriate labs.  - Hemoglobin A1c ([facility]) - Insulin, random  4. Vitamin D deficiency  - Continue supplementation.  - Vitamin D (25 hydroxy)  5. T2_NIDDM w/Peripheral Neuropathy  - Hemoglobin A1c ([facility]) - Insulin, random  6. Medication management  - CBC with Diff - COMPLETE METABOLIC PANEL WITH GFR - Magnesium - Lipid Profile - TSH - Hemoglobin A1c ([facility]) - Insulin, random - Vitamin D (25 hydroxy)  Discussed  regular exercise, BP monitoring, weight control to achieve/maintain BMI less than 25 and discussed med and SE's. Recommended labs to assess and monitor clinical status with further disposition pending results of labs.  I discussed the assessment and treatment plan with the patient. The patient was provided an opportunity to ask questions and all were answered. The patient agreed with the plan and demonstrated an understanding of the instructions.  I provided over 30 minutes of exam, counseling, chart review and  complex critical decision making.  Marinus MawWilliam D Milynn Quirion, MD

## 2019-01-20 NOTE — Patient Instructions (Signed)

## 2019-01-21 ENCOUNTER — Encounter: Payer: Self-pay | Admitting: *Deleted

## 2019-01-21 LAB — LIPID PANEL
Cholesterol: 243 mg/dL — ABNORMAL HIGH (ref <200)
HDL: 29 mg/dL — ABNORMAL LOW (ref >=40)
Non-HDL Cholesterol (Calc): 214 mg/dL (calc) — ABNORMAL HIGH (ref <130)
Total CHOL/HDL Ratio: 8.4 (calc) — ABNORMAL HIGH (ref <5.0)
Triglycerides: 654 mg/dL — ABNORMAL HIGH (ref <150)

## 2019-01-21 LAB — HEMOGLOBIN A1C
Hgb A1c MFr Bld: 12 % of total Hgb — ABNORMAL HIGH (ref <5.7)
Mean Plasma Glucose: 298 (calc)
eAG (mmol/L): 16.5 (calc)

## 2019-01-21 LAB — COMPLETE METABOLIC PANEL WITH GFR
AG Ratio: 1.5 (calc) (ref 1.0–2.5)
ALT: 49 U/L — ABNORMAL HIGH (ref 9–46)
AST: 29 U/L (ref 10–35)
Albumin: 4.3 g/dL (ref 3.6–5.1)
Alkaline phosphatase (APISO): 88 U/L (ref 35–144)
BUN: 23 mg/dL (ref 7–25)
CO2: 27 mmol/L (ref 20–32)
Calcium: 9.6 mg/dL (ref 8.6–10.3)
Chloride: 92 mmol/L — ABNORMAL LOW (ref 98–110)
Creat: 0.95 mg/dL (ref 0.70–1.25)
GFR, Est African American: 100 mL/min/{1.73_m2} (ref >=60)
GFR, Est Non African American: 87 mL/min/{1.73_m2} (ref >=60)
Globulin: 2.9 g/dL (calc) (ref 1.9–3.7)
Glucose, Bld: 334 mg/dL — ABNORMAL HIGH (ref 65–99)
Potassium: 4.3 mmol/L (ref 3.5–5.3)
Sodium: 131 mmol/L — ABNORMAL LOW (ref 135–146)
Total Bilirubin: 0.6 mg/dL (ref 0.2–1.2)
Total Protein: 7.2 g/dL (ref 6.1–8.1)

## 2019-01-21 LAB — CBC WITH DIFFERENTIAL/PLATELET
Absolute Monocytes: 1050 cells/uL — ABNORMAL HIGH (ref 200–950)
Basophils Absolute: 94 cells/uL (ref 0–200)
Basophils Relative: 0.8 %
Eosinophils Absolute: 165 cells/uL (ref 15–500)
Eosinophils Relative: 1.4 %
HCT: 49.4 % (ref 38.5–50.0)
Hemoglobin: 17.4 g/dL — ABNORMAL HIGH (ref 13.2–17.1)
Lymphs Abs: 3776 cells/uL (ref 850–3900)
MCH: 33.5 pg — ABNORMAL HIGH (ref 27.0–33.0)
MCHC: 35.2 g/dL (ref 32.0–36.0)
MCV: 95.2 fL (ref 80.0–100.0)
MPV: 12.3 fL (ref 7.5–12.5)
Monocytes Relative: 8.9 %
Neutro Abs: 6714 cells/uL (ref 1500–7800)
Neutrophils Relative %: 56.9 %
Platelets: 191 10*3/uL (ref 140–400)
RBC: 5.19 10*6/uL (ref 4.20–5.80)
RDW: 12.1 % (ref 11.0–15.0)
Total Lymphocyte: 32 %
WBC: 11.8 10*3/uL — ABNORMAL HIGH (ref 3.8–10.8)

## 2019-01-21 LAB — TSH: TSH: 1.2 mIU/L (ref 0.40–4.50)

## 2019-01-21 LAB — MAGNESIUM: Magnesium: 2.1 mg/dL (ref 1.5–2.5)

## 2019-01-21 LAB — VITAMIN D 25 HYDROXY (VIT D DEFICIENCY, FRACTURES): Vit D, 25-Hydroxy: 51 ng/mL (ref 30–100)

## 2019-01-21 LAB — INSULIN, RANDOM: Insulin: 35.5 u[IU]/mL — ABNORMAL HIGH

## 2019-02-09 ENCOUNTER — Other Ambulatory Visit: Payer: Self-pay | Admitting: Internal Medicine

## 2019-02-09 DIAGNOSIS — I10 Essential (primary) hypertension: Secondary | ICD-10-CM

## 2019-04-19 NOTE — Progress Notes (Signed)
FOLLOW UP 3 MONTH  Assessment and Plan:   [name] was seen today for follow-up.  Diagnoses and all orders for this visit:  Essential hypertension Continue current medications: Lisinopril 20mg , HCTZ 25mg .  Has lost weight can try half tablet lisinopril with blood pressure monitoring record. Discussed B/P goals. Monitor blood pressure at home; call if consistently over 130/80 Continue DASH diet.   Reminder to go to the ER if any CP, SOB, nausea, dizziness, severe HA, changes vision/speech, left arm numbness and tingling and jaw pain.  -     CBC with Differential/Platelet -     COMPLETE METABOLIC PANEL WITH GFR  Hyperlipidemia associated with type 2 diabetes mellitus ([name]) Discussed dietary and exercise modifications Low fat diet -     Lipid panel  Vitamin D deficiency -     VITAMIN D 25 Hydroxy (Vit-D Deficiency, Fractures)  Type 2 diabetes mellitus with stage 2 chronic kidney disease, without long-term current use of insulin ([name]) Continue medications: Metformin 500mg  one tablet in am and one with dinner. Also taking glipizide 5mg  three times a day. Discussed general issues about diabetes pathophysiology and management. Education: Reviewed 'ABCs' of diabetes management (respective goals in parentheses):  A1C (<7), blood pressure (<130/80), and cholesterol (LDL <70) Dietary recommendations Encouraged aerobic exercise.  Discussed foot care, check daily Yearly retinal exam Dental exam every 6 months Monitor blood glucose, discussed goal for patient -     Hemoglobin A1c ADDENDUM: Samples Steglatro 5mg  x7 days, tolerate well take 15mg  daily.  Follow up in two weeks on new med start and glucose log, check BID.  T2_NIDDM w/Peripheral Neuropathy Taking Gabapentin 800mg  BID Managing at this time  Hyponatremia Check CMP  Chronic obstructive pulmonary disease, unspecified COPD type ([name]) No triggers, well controlled symptoms, cont to monitor Recomend low dose CT / Xray, declines  at this time  Testosterone deficiency No supplementation at this time  Tobacco use disorder Discussed smoking cessation, not ready to quit at this time Will continue to assess readiness  Poorly controlled diabetes mellitus ([name]) Discussed at length Provided record sheet to check glucose BID and record dietary intake  B12 deficiency Supplementation Defer lab this OV  Morbid obesity (BMI 35) Discussed dietary and exercise modifications  Medication management Continued  Dizziness after extension of neck -     meclizine (ANTIVERT) 25 MG tablet; 1/2-1 pill up to 3 times daily for motion sickness/dizziness Patient does not want to take medication or this, sent in if changes mind.  ADDENDUM: Lymphocytosis -     TEST AUTHORIZATION -     Pathologist smear review  Continue diet and meds as discussed. Further disposition pending results of labs. Discussed med's effects and SE's.   Over 30 minutes of face to face exam, counseling, chart review, and critical decision making was performed.   Future Appointments  Date Time Provider Department Center  04/20/2019  4:00 PM [place], , NP [facility] None  07/20/2019  4:00 PM , MD [facility] None    ----------------------------------------------------------------------------------------------------------------------  HPI 59 y.o. male  presents for 3 month follow up on hypertension, cholesterol, diabetes, weight and vitamin D deficiency.  he currently continues to smoke 1 pack a day x 45 years; discussed risks associated with smoking, patient is not ready to quit. last CXR was 09/2016.    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  He was cleared of any acute abnormalities.  He has tingling in his fingers, has been  dropping stuff.  That has been going on for about 3-4 years.  He reports that he gets dizzy, he is losing balance and having falls and several near misses.  This has  been going on for about 5 years.  First fall was in Oct and he hurt his shoulder.   BMI is There is no height or weight on file to calculate BMI., he has been working on diet and exercise. Does watch portions, walks around his office building on 10 min breaks Wt Readings from Last 3 Encounters:  01/20/19 267 lb 12.8 oz (121.5 kg)  11/23/18 269 lb 3.2 oz (122.1 kg)  10/21/18 275 lb (124.7 kg)   His blood pressure has been controlled at home, today their BP is    He does not workout. He denies chest pain, shortness of breath, dizziness.   He is on cholesterol medication (has been prescribed crestor 20 mg daily, but hasn't been taking)  and denies myalgias. His cholesterol is not at goal. The cholesterol last visit was:   Lab Results  Component Value Date   CHOL 243 (H) 01/20/2019   HDL 29 (L) 01/20/2019   LDLCALC  01/20/2019     Comment:     . LDL cholesterol not calculated. Triglyceride levels greater than 400 mg/dL invalidate calculated LDL results. . Reference range: <100 . Desirable range <100 mg/dL for primary prevention;   <70 mg/dL for patients with CHD or diabetic patients  with > or = 2 CHD risk factors. Marland Kitchen LDL-C is now calculated using the Martin-Hopkins  calculation, which is a validated novel method providing  better accuracy than the Friedewald equation in the  estimation of LDL-C.  Horald Pollen et al. Lenox Ahr. 4536;468(03): 2061-2068  (http://education.[facility].com/faq/FAQ164)    TRIG 654 (H) 01/20/2019   CHOLHDL 8.4 (H) 01/20/2019    He has been working on diet and exercise for T2 diabetes  Neuropathy on gabapentin 1 am and 2 at night- states hands and feet Hyperlipidemia- stopped crestor 20 mg 3 days a week due to muscle aches.  ACE no CKD On Glipizide twice a day, no hypoglycemia, off metformin.  denies foot ulcerations, increased appetite, nausea, polydipsia, polyuria, visual disturbances, vomiting and weight loss.  He does not currently check sugars, he  does not have a machine.  Last A1C in the office was:  Lab Results  Component Value Date   HGBA1C 12.0 (H) 01/20/2019     Lab Results  Component Value Date   GFRNONAA 87 01/20/2019    Patient is on Vitamin D supplement and at goal at recent check:    Lab Results  Component Value Date   VD25OH 51 01/20/2019        Current Medications:  Current Outpatient Medications on File Prior to Visit  Medication Sig  . aspirin 81 MG tablet Take 81 mg by mouth daily.  . cephALEXin (KEFLEX) 500 MG capsule Take 1 capsule 4 x /day with Meals & Bedtime for Skin Infection  . Cholecalciferol 50000 units TABS Take 1 tablet by mouth 2 (two) times a week.   . citalopram (CELEXA) 40 MG tablet Take 1 tablet Daily for Mood  . fluconazole (DIFLUCAN) 150 MG tablet Take 1 tablet 2 x /week  if needed for yeast infection  . gabapentin (NEURONTIN) 800 MG tablet Take 1/2  to 1 tablet 3 to 4 x /day as needed for Diabetic Neuropathy  . glipiZIDE (GLUCOTROL) 5 MG tablet Take 1 tablet 3 x /day with Meals for Diabetes  .  hydrochlorothiazide (HYDRODIURIL) 25 MG tablet Take 1 tablet by mouth once daily  . lisinopril (ZESTRIL) 20 MG tablet Take 1 tablet Daily for BP & Diabetic Kidney Protection  . meloxicam (MOBIC) 15 MG tablet Take one daily with food for 2 weeks, can take with tylenol, can not take with aleve, iburpofen, then as needed daily for pain  . metFORMIN (GLUCOPHAGE-XR) 500 MG 24 hr tablet Take 1/2 to 1 or  2 tablets 2 x /day with Meals for Diabetes for Diabetes   No current facility-administered medications on file prior to visit.     Allergies:  Allergies  Allergen Reactions  . Invokana [Canagliflozin]     Extremity edema/caused pain  . Codeine Camsylate [Codeine] Rash  . Morphine And Related Rash     Medical History:  Past Medical History:  Diagnosis Date  . Diabetic neuropathy ([place])   . History of hepatitis C   . History of kidney stones   . Hyperlipidemia   . Hypertension   . Other  testicular hypofunction   . Type II or unspecified type diabetes mellitus without mention of complication, not stated as uncontrolled   . Vitamin D deficiency    Family history- Reviewed and unchanged Social history- Reviewed and unchanged   Review of Systems:  Review of Systems  Constitutional: Negative for malaise/fatigue and weight loss.  HENT: Negative for hearing loss and tinnitus.   Eyes: Negative for blurred vision and double vision.  Respiratory: Negative for cough, shortness of breath and wheezing.   Cardiovascular: Negative for chest pain, palpitations, orthopnea, claudication and leg swelling.  Gastrointestinal: Negative for abdominal pain, blood in stool, constipation, diarrhea, heartburn, melena, nausea and vomiting.  Genitourinary: Negative.   Musculoskeletal: Negative for joint pain and myalgias.  Skin: Negative for rash.  Neurological: Negative for dizziness, tingling, sensory change, weakness and headaches.  Endo/Heme/Allergies: Negative for polydipsia.  Psychiatric/Behavioral: Negative for depression, hallucinations, memory loss and substance abuse. The patient is not nervous/anxious.   All other systems reviewed and are negative.     Physical Exam: There were no vitals taken for this visit. Wt Readings from Last 3 Encounters:  01/20/19 267 lb 12.8 oz (121.5 kg)  11/23/18 269 lb 3.2 oz (122.1 kg)  10/21/18 275 lb (124.7 kg)   General Appearance: Well nourished, in no apparent distress. Eyes: PERRLA, EOMs, conjunctiva no swelling or erythema Sinuses: No Frontal/maxillary tenderness ENT/Mouth: Ext aud canals clear, TMs without erythema, bulging. No erythema, swelling, or exudate on post pharynx.  Tonsils not swollen or erythematous. Hearing normal.  Neck: Supple, thyroid normal.  Respiratory: Respiratory effort normal, BS equal bilaterally with diffuse wheezing without rales, rhonchi,  or stridor.  Cardio: RRR with no MRGs. Brisk peripheral pulses without  edema.  Abdomen: Soft, + BS.  Non tender, no guarding, rebound, hernias, masses. Lymphatics: Non tender without lymphadenopathy.  Musculoskeletal: Full ROM, 5/5 strength, Normal gait Skin: Warm, dry without rashes, lesions, ecchymosis.  Neuro: Cranial nerves intact. No cerebellar symptoms.  Psych: Awake and oriented X 3, normal affect, Insight and Judgment appropriate.    Garnet Sierras, NP 10:53 PM [facility]

## 2019-04-20 ENCOUNTER — Encounter: Payer: Self-pay | Admitting: Adult Health Nurse Practitioner

## 2019-04-20 ENCOUNTER — Other Ambulatory Visit: Payer: Self-pay

## 2019-04-20 ENCOUNTER — Ambulatory Visit: Payer: Managed Care, Other (non HMO) | Admitting: Adult Health Nurse Practitioner

## 2019-04-20 VITALS — BP 120/60 | HR 58 | Temp 97.5°F | Wt 264.0 lb

## 2019-04-20 DIAGNOSIS — J449 Chronic obstructive pulmonary disease, unspecified: Secondary | ICD-10-CM

## 2019-04-20 DIAGNOSIS — Z79899 Other long term (current) drug therapy: Secondary | ICD-10-CM

## 2019-04-20 DIAGNOSIS — E1140 Type 2 diabetes mellitus with diabetic neuropathy, unspecified: Secondary | ICD-10-CM

## 2019-04-20 DIAGNOSIS — E1169 Type 2 diabetes mellitus with other specified complication: Secondary | ICD-10-CM

## 2019-04-20 DIAGNOSIS — E871 Hypo-osmolality and hyponatremia: Secondary | ICD-10-CM

## 2019-04-20 DIAGNOSIS — E1122 Type 2 diabetes mellitus with diabetic chronic kidney disease: Secondary | ICD-10-CM | POA: Diagnosis not present

## 2019-04-20 DIAGNOSIS — E785 Hyperlipidemia, unspecified: Secondary | ICD-10-CM

## 2019-04-20 DIAGNOSIS — E559 Vitamin D deficiency, unspecified: Secondary | ICD-10-CM | POA: Diagnosis not present

## 2019-04-20 DIAGNOSIS — E538 Deficiency of other specified B group vitamins: Secondary | ICD-10-CM

## 2019-04-20 DIAGNOSIS — E1165 Type 2 diabetes mellitus with hyperglycemia: Secondary | ICD-10-CM

## 2019-04-20 DIAGNOSIS — I10 Essential (primary) hypertension: Secondary | ICD-10-CM | POA: Diagnosis not present

## 2019-04-20 DIAGNOSIS — E1149 Type 2 diabetes mellitus with other diabetic neurological complication: Secondary | ICD-10-CM

## 2019-04-20 DIAGNOSIS — R42 Dizziness and giddiness: Secondary | ICD-10-CM

## 2019-04-20 DIAGNOSIS — D72820 Lymphocytosis (symptomatic): Secondary | ICD-10-CM

## 2019-04-20 DIAGNOSIS — E349 Endocrine disorder, unspecified: Secondary | ICD-10-CM

## 2019-04-20 DIAGNOSIS — N182 Chronic kidney disease, stage 2 (mild): Secondary | ICD-10-CM

## 2019-04-20 DIAGNOSIS — F17200 Nicotine dependence, unspecified, uncomplicated: Secondary | ICD-10-CM

## 2019-04-20 MED ORDER — MECLIZINE HCL 25 MG PO TABS: ORAL_TABLET | ORAL | 0 refills | Status: DC

## 2019-04-20 NOTE — Patient Instructions (Signed)
Check your blood glucose twice a day and record on the sheet provided.   Take half your blood pressure tablet lisinopril.  Check your blood pressure twice a day and write it down.  We will contact you in 1-3 days with your lab results.   If the dizziness is persistent we can try meclizine to reduce this.    Vit D  & Vit C 1,000 mg   are recommended to help protect  against the Covid-19 and other Corona viruses.    Also it's recommended  to take  Zinc 50 mg  to help  protect against the Covid-19   and best place to get  is also on [email or web address]  and don't pay more than 6-8 cents /pill !  ================================ Coronavirus (COVID-19) Are you at risk?  Are you at risk for the Coronavirus (COVID-19)?  To be considered HIGH RISK for Coronavirus (COVID-19), you have to meet the following criteria:  . Traveled to [place], [place], [place], [place] or [place]; or in the [place] to [place], [place], [place]  . or [place]; and have fever, cough, and shortness of breath within the last 2 weeks of travel OR . Been in close contact with a person diagnosed with COVID-19 within the last 2 weeks and have  . fever, cough,and shortness of breath .  . IF YOU DO NOT MEET THESE CRITERIA, YOU ARE CONSIDERED LOW RISK FOR COVID-19.  What to do if you are HIGH RISK for COVID-19?  Marland Kitchen If you are having a medical emergency, call 911. . Seek medical care right away. Before you go to a doctor's office, urgent care or emergency department, .  call ahead and tell them about your recent travel, contact with someone diagnosed with COVID-19  .  and your symptoms.  . You should receive instructions from your physician's office regarding next steps of care.  . When you arrive at healthcare provider, tell the healthcare staff immediately you have returned from  . visiting [place], [place], [place], [place] or [place]; or traveled in the [place] to [place], [place],  . [place]  or [place] in the last two weeks or you have been in close contact with a person diagnosed with  . COVID-19 in the last 2 weeks.   . Tell the health care staff about your symptoms: fever, cough and shortness of breath. . After you have been seen by a medical provider, you will be either: o Tested for (COVID-19) and discharged home on quarantine except to seek medical care if  o symptoms worsen, and asked to  - Stay home and avoid contact with others until you get your results (4-5 days)  - Avoid travel on public transportation if possible (such as bus, train, or airplane) or o Sent to the Emergency Department by EMS for evaluation, COVID-19 testing  and  o possible admission depending on your condition and test results.  What to do if you are LOW RISK for COVID-19?  Reduce your risk of any infection by using the same precautions used for avoiding the common cold or flu:  Marland Kitchen Wash your hands often with soap and warm water for at least 20 seconds.  If soap and water are not readily available,  . use an alcohol-based hand sanitizer with at least 60% alcohol.  . If coughing or sneezing, cover your mouth and nose by coughing or sneezing into the elbow areas of your shirt or coat, .  into  a tissue or into your sleeve (not your hands). . Avoid shaking hands with others and consider head nods or verbal greetings only. . Avoid touching your eyes, nose, or mouth with unwashed hands.  . Avoid close contact with people who are sick. . Avoid places or events with large numbers of people in one location, like concerts or sporting events. . Carefully consider travel plans you have or are making. . If you are planning any travel outside or inside the [place], visit the CDC's Travelers' Health webpage for the latest health notices. . If you have some symptoms but not all symptoms, continue to monitor at home and seek medical attention  . if your symptoms worsen. . If you are having a medical emergency, call  911.   . >>>>>>>>>>>>>>>>>>>>>>>>>>>>>>>>> . We Do NOT Approve of  [facility], [employer]  Our Patients  To Do Home Visits & We Do NOT Approve of [employer] > > > > > > > > > > > > > > > > > > > > > > > > > > > > > > > > > > > > > > >  Preventive Care for Adults  A healthy lifestyle and preventive care can promote health and wellness. Preventive health guidelines for women include the following key practices.  A routine yearly physical is a good way to check with your health care provider about your health and preventive screening. It is a chance to share any concerns and updates on your health and to receive a thorough exam.  Visit your dentist for a routine exam and preventive care every 6 months. Brush your teeth twice a day and floss once a day. Good oral hygiene prevents tooth decay and gum disease.  The frequency of eye exams is based on your age, health, family medical history, use of contact lenses, and other factors. Follow your health care provider's recommendations for frequency of eye exams.  Eat a healthy diet. Foods like vegetables, fruits, whole grains, low-fat dairy products, and lean protein foods contain the nutrients you need without too many calories. Decrease your intake of foods high in solid fats, added sugars, and salt. Eat the right amount of calories for you. Get information about a proper diet from your health care provider, if necessary.  Regular physical exercise is one of the most important things you can do for your health. Most adults should get at least 150 minutes of moderate-intensity exercise (any activity that increases your heart rate and causes you to sweat) each week. In addition, most adults need muscle-strengthening exercises on 2 or more days a week.  Maintain a healthy weight. The body mass index (BMI) is a screening tool to identify possible weight problems. It provides an estimate of body fat based on height and  weight. Your health care provider can find your BMI and can help you achieve or maintain a healthy weight. For adults 20 years and older:  A BMI below 18.5 is considered underweight.  A BMI of 18.5 to 24.9 is normal.  A BMI of 25 to 29.9 is considered overweight.  A BMI of 30 and above is considered obese.  Maintain normal blood lipids and cholesterol levels by exercising and minimizing your intake of saturated fat. Eat a balanced diet with plenty of fruit and vegetables. If your lipid or cholesterol levels are high, you are over 50, or you are at high risk for heart disease, you may need your cholesterol levels checked  more frequently. Ongoing high lipid and cholesterol levels should be treated with medicines if diet and exercise are not working.  If you smoke, find out from your health care provider how to quit. If you do not use tobacco, do not start.  Lung cancer screening is recommended for adults aged 75-80 years who are at high risk for developing lung cancer because of a history of smoking. A yearly low-dose CT scan of the lungs is recommended for people who have at least a 30-pack-year history of smoking and are a current smoker or have quit within the past 15 years. A pack year of smoking is smoking an average of 1 pack of cigarettes a day for 1 year (for example: 1 pack a day for 30 years or 2 packs a day for 15 years). Yearly screening should continue until the smoker has stopped smoking for at least 15 years. Yearly screening should be stopped for people who develop a health problem that would prevent them from having lung cancer treatment.  Avoid use of street drugs. Do not share needles with anyone. Ask for help if you need support or instructions about stopping the use of drugs.  High blood pressure causes heart disease and increases the risk of stroke.  Ongoing high blood pressure should be treated with medicines if weight loss and exercise do not work.  If you are 9-79 years  old, ask your health care provider if you should take aspirin to prevent strokes.  Diabetes screening involves taking a blood sample to check your fasting blood sugar level. This should be done once every 3 years, after age 21, if you are within normal weight and without risk factors for diabetes. Testing should be considered at a younger age or be carried out more frequently if you are overweight and have at least 1 risk factor for diabetes.  Breast cancer screening is essential preventive care for women. You should practice "breast self-awareness." This means understanding the normal appearance and feel of your breasts and may include breast self-examination. Any changes detected, no matter how small, should be reported to a health care provider. Women in their 10s and 30s should have a clinical breast exam (CBE) by a health care provider as part of a regular health exam every 1 to 3 years. After age 31, women should have a CBE every year. Starting at age 87, women should consider having a mammogram (breast X-ray test) every year. Women who have a family history of breast cancer should talk to their health care provider about genetic screening. Women at a high risk of breast cancer should talk to their health care providers about having an MRI and a mammogram every year.  Breast cancer gene (BRCA)-related cancer risk assessment is recommended for women who have family members with BRCA-related cancers. BRCA-related cancers include breast, ovarian, tubal, and peritoneal cancers. Having family members with these cancers may be associated with an increased risk for harmful changes (mutations) in the breast cancer genes BRCA1 and BRCA2. Results of the assessment will determine the need for genetic counseling and BRCA1 and BRCA2 testing.  Routine pelvic exams to screen for cancer are no longer recommended for nonpregnant women who are considered low risk for cancer of the pelvic organs (ovaries, uterus, and  vagina) and who do not have symptoms. Ask your health care provider if a screening pelvic exam is right for you.  If you have had past treatment for cervical cancer or a condition that could lead to  cancer, you need Pap tests and screening for cancer for at least 20 years after your treatment. If Pap tests have been discontinued, your risk factors (such as having a new sexual partner) need to be reassessed to determine if screening should be resumed. Some women have medical problems that increase the chance of getting cervical cancer. In these cases, your health care provider may recommend more frequent screening and Pap tests.    Colorectal cancer can be detected and often prevented. Most routine colorectal cancer screening begins at the age of 73 years and continues through age 22 years. However, your health care provider may recommend screening at an earlier age if you have risk factors for colon cancer. On a yearly basis, your health care provider may provide home test kits to check for hidden blood in the stool. Use of a small camera at the end of a tube, to directly examine the colon (sigmoidoscopy or colonoscopy), can detect the earliest forms of colorectal cancer. Talk to your health care provider about this at age 65, when routine screening begins.  Direct exam of the colon should be repeated every 5-10 years through age 54 years, unless early forms of pre-cancerous polyps or small growths are found.  Osteoporosis is a disease in which the bones lose minerals and strength with aging. This can result in serious bone fractures or breaks. The risk of osteoporosis can be identified using a bone density scan. Women ages 42 years and over and women at risk for fractures or osteoporosis should discuss screening with their health care providers. Ask your health care provider whether you should take a calcium supplement or vitamin D to reduce the rate of osteoporosis.  Menopause can be associated with  physical symptoms and risks. Hormone replacement therapy is available to decrease symptoms and risks. You should talk to your health care provider about whether hormone replacement therapy is right for you.  Use sunscreen. Apply sunscreen liberally and repeatedly throughout the day. You should seek shade when your shadow is shorter than you. Protect yourself by wearing long sleeves, pants, a wide-brimmed hat, and sunglasses year round, whenever you are outdoors.  Once a month, do a whole body skin exam, using a mirror to look at the skin on your back. Tell your health care provider of new moles, moles that have irregular borders, moles that are larger than a pencil eraser, or moles that have changed in shape or color.  Stay current with required vaccines (immunizations).  Influenza vaccine. All adults should be immunized every year.  Tetanus, diphtheria, and acellular pertussis (Td, Tdap) vaccine. Pregnant women should receive 1 dose of Tdap vaccine during each pregnancy. The dose should be obtained regardless of the length of time since the last dose. Immunization is preferred during the 27th-36th week of gestation. An adult who has not previously received Tdap or who does not know her vaccine status should receive 1 dose of Tdap. This initial dose should be followed by tetanus and diphtheria toxoids (Td) booster doses every 10 years. Adults with an unknown or incomplete history of completing a 3-dose immunization series with Td-containing vaccines should begin or complete a primary immunization series including a Tdap dose. Adults should receive a Td booster every 10 years.    Zoster vaccine. One dose is recommended for adults aged 46 years or older unless certain conditions are present.    Pneumococcal 13-valent conjugate (PCV13) vaccine. When indicated, a person who is uncertain of her immunization history and has no  record of immunization should receive the PCV13 vaccine. An adult aged 93  years or older who has certain medical conditions and has not been previously immunized should receive 1 dose of PCV13 vaccine. This PCV13 should be followed with a dose of pneumococcal polysaccharide (PPSV23) vaccine. The PPSV23 vaccine dose should be obtained at least 1 or more year(s) after the dose of PCV13 vaccine. An adult aged 32 years or older who has certain medical conditions and previously received 1 or more doses of PPSV23 vaccine should receive 1 dose of PCV13. The PCV13 vaccine dose should be obtained 1 or more years after the last PPSV23 vaccine dose.    Pneumococcal polysaccharide (PPSV23) vaccine. When PCV13 is also indicated, PCV13 should be obtained first. All adults aged 55 years and older should be immunized. An adult younger than age 3 years who has certain medical conditions should be immunized. Any person who resides in a nursing home or long-term care facility should be immunized. An adult smoker should be immunized. People with an immunocompromised condition and certain other conditions should receive both PCV13 and PPSV23 vaccines. People with human immunodeficiency virus (HIV) infection should be immunized as soon as possible after diagnosis. Immunization during chemotherapy or radiation therapy should be avoided. Routine use of PPSV23 vaccine is not recommended for American Indians, [place] Natives, or people younger than 65 years unless there are medical conditions that require PPSV23 vaccine. When indicated, people who have unknown immunization and have no record of immunization should receive PPSV23 vaccine. One-time revaccination 5 years after the first dose of PPSV23 is recommended for people aged 19-64 years who have chronic kidney failure, nephrotic syndrome, asplenia, or immunocompromised conditions. People who received 1-2 doses of PPSV23 before age 24 years should receive another dose of PPSV23 vaccine at age 6 years or later if at least 5 years have passed since the  previous dose. Doses of PPSV23 are not needed for people immunized with PPSV23 at or after age 76 years.   Preventive Services / Frequency  Ages 47 years and over  Blood pressure check.  Lipid and cholesterol check.  Lung cancer screening. / Every year if you are aged 74-80 years and have a 30-pack-year history of smoking and currently smoke or have quit within the past 15 years. Yearly screening is stopped once you have quit smoking for at least 15 years or develop a health problem that would prevent you from having lung cancer treatment.  Clinical breast exam.** / Every year after age 25 years.   BRCA-related cancer risk assessment.** / For women who have family members with a BRCA-related cancer (breast, ovarian, tubal, or peritoneal cancers).  Mammogram.** / Every year beginning at age 12 years and continuing for as long as you are in good health. Consult with your health care provider.  Pap test.** / Every 3 years starting at age 58 years through age 56 or 68 years with 3 consecutive normal Pap tests. Testing can be stopped between 65 and 70 years with 3 consecutive normal Pap tests and no abnormal Pap or HPV tests in the past 10 years.  Fecal occult blood test (FOBT) of stool. / Every year beginning at age 40 years and continuing until age 75 years. You may not need to do this test if you get a colonoscopy every 10 years.  Flexible sigmoidoscopy or colonoscopy.** / Every 5 years for a flexible sigmoidoscopy or every 10 years for a colonoscopy beginning at age 72 years and continuing until age  75 years.  Hepatitis C blood test.** / For all people born from [birth year] through 1967 and any individual with known risks for hepatitis C.  Osteoporosis screening.** / A one-time screening for women ages 2 years and over and women at risk for fractures or osteoporosis.  Skin self-exam. / Monthly.  Influenza vaccine. / Every year.  Tetanus, diphtheria, and acellular pertussis (Tdap/Td)  vaccine.** / 1 dose of Td every 10 years.  Zoster vaccine.** / 1 dose for adults aged 61 years or older.  Pneumococcal 13-valent conjugate (PCV13) vaccine.** / Consult your health care provider.  Pneumococcal polysaccharide (PPSV23) vaccine.** / 1 dose for all adults aged 63 years and older. Screening for abdominal aortic aneurysm (AAA)  by ultrasound is recommended for people who have history of high blood pressure or who are current or former smokers. ++++++++++++++++++++ Recommend Adult Low Dose Aspirin or  coated  Aspirin 81 mg daily  To reduce risk of Colon Cancer 40 %,  Skin Cancer 26 % ,  Melanoma 46%  and  Pancreatic cancer 60% ++++++++++++++++++++ Vitamin D goal  is between 70-100.  Please make sure that you are taking your Vitamin D as directed.  It is very important as a natural anti-inflammatory  helping hair, skin, and nails, as well as reducing stroke and heart attack risk.  It helps your bones and helps with mood. It also decreases numerous cancer risks so please take it as directed.  Low Vit D is associated with a 200-300% higher risk for CANCER  and 200-300% higher risk for HEART   ATTACK  &  STROKE.   .....................................Marland Kitchen It is also associated with higher death rate at younger ages,  autoimmune diseases like Rheumatoid arthritis, Lupus, Multiple Sclerosis.    Also many other serious conditions, like depression, Alzheimer's Dementia, infertility, muscle aches, fatigue, fibromyalgia - just to name a few. ++++++++++++++++++ Recommend the book "The END of DIETING" by Dr Excell Seltzer  & the book "The END of DIABETES " by Dr Excell Seltzer At [facility].[email or web address] - get book & Audio CD's    Being diabetic has a  300% increased risk for heart attack, stroke, cancer, and alzheimer- type vascular dementia. It is very important that you work harder with diet by avoiding all foods that are white. Avoid white rice (brown & wild rice is OK), white potatoes (sweetpotatoes  in moderation is OK), White bread or wheat bread or anything made out of white flour like bagels, donuts, rolls, buns, biscuits, cakes, pastries, cookies, pizza crust, and pasta (made from white flour & egg whites) - vegetarian pasta or spinach or wheat pasta is OK. Multigrain breads like Arnold's or [employer], or multigrain sandwich thins or flatbreads.  Diet, exercise and weight loss can reverse and cure diabetes in the early stages.  Diet, exercise and weight loss is very important in the control and prevention of complications of diabetes which affects every system in your body, ie. Brain - dementia/stroke, eyes - glaucoma/blindness, heart - heart attack/heart failure, kidneys - dialysis, stomach - gastric paralysis, intestines - malabsorption, nerves - severe painful neuritis, circulation - gangrene & loss of a leg(s), and finally cancer and Alzheimers.    I recommend avoid fried & greasy foods,  sweets/candy, white rice (brown or wild rice or Quinoa is OK), white potatoes (sweet potatoes are OK) - anything made from white flour - bagels, doughnuts, rolls, buns, biscuits,white and wheat breads, pizza crust and traditional pasta made of white flour & egg white(vegetarian pasta or  spinach or wheat pasta is OK).  Multi-grain bread is OK - like multi-grain flat bread or sandwich thins. Avoid alcohol in excess. Exercise is also important.    Eat all the vegetables you want - avoid meat, especially red meat and dairy - especially cheese.  Cheese is the most concentrated form of trans-fats which is the worst thing to clog up our arteries. Veggie cheese is OK which can be found in the fresh produce section at [employer] or [employer] or [employer]  +++++++++++++++++++ DASH Eating Plan  DASH stands for "Dietary Approaches to Stop Hypertension."   The DASH eating plan is a healthy eating plan that has been shown to reduce high blood pressure (hypertension). Additional health benefits may include  reducing the risk of type 2 diabetes mellitus, heart disease, and stroke. The DASH eating plan may also help with weight loss. WHAT DO I NEED TO KNOW ABOUT THE DASH EATING PLAN? For the DASH eating plan, you will follow these general guidelines:  Choose foods with a percent daily value for sodium of less than 5% (as listed on the food label).  Use salt-free seasonings or herbs instead of table salt or sea salt.  Check with your health care provider or pharmacist before using salt substitutes.  Eat lower-sodium products, often labeled as "lower sodium" or "no salt added."  Eat fresh foods.  Eat more vegetables, fruits, and low-fat dairy products.  Choose whole grains. Look for the word "whole" as the first word in the ingredient list.  Choose fish   Limit sweets, desserts, sugars, and sugary drinks.  Choose heart-healthy fats.  Eat veggie cheese   Eat more home-cooked food and less restaurant, buffet, and fast food.  Limit fried foods.  Cook foods using methods other than frying.  Limit canned vegetables. If you do use them, rinse them well to decrease the sodium.  When eating at a restaurant, ask that your food be prepared with less salt, or no salt if possible.                      WHAT FOODS CAN I EAT? Read Dr Fara Olden Fuhrman's books on The End of Dieting & The End of Diabetes  Grains Whole grain or whole wheat bread. Brown rice. Whole grain or whole wheat pasta. Quinoa, bulgur, and whole grain cereals. Low-sodium cereals. Corn or whole wheat flour tortillas. Whole grain cornbread. Whole grain crackers. Low-sodium crackers.  Vegetables Fresh or frozen vegetables (raw, steamed, roasted, or grilled). Low-sodium or reduced-sodium tomato and vegetable juices. Low-sodium or reduced-sodium tomato sauce and paste. Low-sodium or reduced-sodium canned vegetables.   Fruits All fresh, canned (in natural juice), or frozen fruits.  Protein Products  All fish and seafood.  Dried  beans, peas, or lentils. Unsalted nuts and seeds. Unsalted canned beans.  Dairy Low-fat dairy products, such as skim or 1% milk, 2% or reduced-fat cheeses, low-fat ricotta or cottage cheese, or plain low-fat yogurt. Low-sodium or reduced-sodium cheeses.  Fats and Oils Tub margarines without trans fats. Light or reduced-fat mayonnaise and salad dressings (reduced sodium). Avocado. Safflower, olive, or canola oils. Natural peanut or almond butter.  Other Unsalted popcorn and pretzels. The items listed above may not be a complete list of recommended foods or beverages. Contact your dietitian for more options.  +++++++++++++++  WHAT FOODS ARE NOT RECOMMENDED? Grains/ White flour or wheat flour White bread. White pasta. White rice. Refined cornbread. Bagels and croissants. Crackers that contain trans fat.  Vegetables  Creamed or fried vegetables. Vegetables in a . Regular canned vegetables. Regular canned tomato sauce and paste. Regular tomato and vegetable juices.  Fruits Dried fruits. Canned fruit in light or heavy syrup. Fruit juice.  Meat and Other Protein Products Meat in general - RED meat & White meat.  Fatty cuts of meat. Ribs, chicken wings, all processed meats as bacon, sausage, bologna, salami, fatback, hot dogs, bratwurst and packaged luncheon meats.  Dairy Whole or 2% milk, cream, half-and-half, and cream cheese. Whole-fat or sweetened yogurt. Full-fat cheeses or blue cheese. Non-dairy creamers and whipped toppings. Processed cheese, cheese spreads, or cheese curds.  Condiments Onion and garlic salt, seasoned salt, table salt, and sea salt. Canned and packaged gravies. Worcestershire sauce. Tartar sauce. Barbecue sauce. Teriyaki sauce. Soy sauce, including reduced sodium. Steak sauce. Fish sauce. Oyster sauce. Cocktail sauce. Horseradish. Ketchup and mustard. Meat flavorings and tenderizers. Bouillon cubes. Hot sauce. Tabasco sauce. Marinades. Taco seasonings.  Relishes.  Fats and Oils Butter, stick margarine, lard, shortening and bacon fat. Coconut, palm kernel, or palm oils. Regular salad dressings.  Pickles and olives. Salted popcorn and pretzels.  The items listed above may not be a complete list of foods and beverages to avoid.

## 2019-04-21 ENCOUNTER — Other Ambulatory Visit: Payer: Self-pay | Admitting: Adult Health Nurse Practitioner

## 2019-04-21 DIAGNOSIS — D72820 Lymphocytosis (symptomatic): Secondary | ICD-10-CM

## 2019-04-22 LAB — CBC WITH DIFFERENTIAL/PLATELET
Absolute Monocytes: 998 cells/uL — ABNORMAL HIGH (ref 200–950)
Basophils Absolute: 77 cells/uL (ref 0–200)
Basophils Relative: 0.6 %
Eosinophils Absolute: 141 cells/uL (ref 15–500)
Eosinophils Relative: 1.1 %
HCT: 45.2 % (ref 38.5–50.0)
Hemoglobin: 15.8 g/dL (ref 13.2–17.1)
Lymphs Abs: 4096 cells/uL — ABNORMAL HIGH (ref 850–3900)
MCH: 33.3 pg — ABNORMAL HIGH (ref 27.0–33.0)
MCHC: 35 g/dL (ref 32.0–36.0)
MCV: 95.2 fL (ref 80.0–100.0)
MPV: 11.8 fL (ref 7.5–12.5)
Monocytes Relative: 7.8 %
Neutro Abs: 7488 cells/uL (ref 1500–7800)
Neutrophils Relative %: 58.5 %
Platelets: 191 10*3/uL (ref 140–400)
RBC: 4.75 10*6/uL (ref 4.20–5.80)
RDW: 12.8 % (ref 11.0–15.0)
Total Lymphocyte: 32 %
WBC: 12.8 10*3/uL — ABNORMAL HIGH (ref 3.8–10.8)

## 2019-04-22 LAB — TEST AUTHORIZATION

## 2019-04-22 LAB — LIPID PANEL
Cholesterol: 178 mg/dL (ref <200)
HDL: 25 mg/dL — ABNORMAL LOW (ref >=40)
LDL Cholesterol (Calc): 111 mg/dL (calc) — ABNORMAL HIGH
Non-HDL Cholesterol (Calc): 153 mg/dL (calc) — ABNORMAL HIGH (ref <130)
Total CHOL/HDL Ratio: 7.1 (calc) — ABNORMAL HIGH (ref <5.0)
Triglycerides: 315 mg/dL — ABNORMAL HIGH (ref <150)

## 2019-04-22 LAB — VITAMIN D 25 HYDROXY (VIT D DEFICIENCY, FRACTURES): Vit D, 25-Hydroxy: 62 ng/mL (ref 30–100)

## 2019-04-22 LAB — COMPLETE METABOLIC PANEL WITH GFR
AG Ratio: 1.4 (calc) (ref 1.0–2.5)
ALT: 31 U/L (ref 9–46)
AST: 20 U/L (ref 10–35)
Albumin: 3.6 g/dL (ref 3.6–5.1)
Alkaline phosphatase (APISO): 68 U/L (ref 35–144)
BUN: 25 mg/dL (ref 7–25)
CO2: 25 mmol/L (ref 20–32)
Calcium: 9.9 mg/dL (ref 8.6–10.3)
Chloride: 101 mmol/L (ref 98–110)
Creat: 0.98 mg/dL (ref 0.70–1.25)
GFR, Est African American: 97 mL/min/{1.73_m2} (ref >=60)
GFR, Est Non African American: 83 mL/min/{1.73_m2} (ref >=60)
Globulin: 2.6 g/dL (calc) (ref 1.9–3.7)
Glucose, Bld: 204 mg/dL — ABNORMAL HIGH (ref 65–99)
Potassium: 4.2 mmol/L (ref 3.5–5.3)
Sodium: 135 mmol/L (ref 135–146)
Total Bilirubin: 0.4 mg/dL (ref 0.2–1.2)
Total Protein: 6.2 g/dL (ref 6.1–8.1)

## 2019-04-22 LAB — HEMOGLOBIN A1C
Hgb A1c MFr Bld: 10.8 % of total Hgb — ABNORMAL HIGH (ref <5.7)
Mean Plasma Glucose: 263 (calc)
eAG (mmol/L): 14.6 (calc)

## 2019-04-22 LAB — PATHOLOGIST SMEAR REVIEW

## 2019-04-27 ENCOUNTER — Ambulatory Visit: Payer: Managed Care, Other (non HMO) | Admitting: Adult Health Nurse Practitioner

## 2019-05-10 NOTE — Progress Notes (Signed)
FOLLOW UP 3 MONTH  Assessment and Plan:  Kyce was seen today for follow-up.  Diagnoses and all orders for this visit:  Type 2 diabetes mellitus with stage 2 chronic kidney disease, without long-term current use of insulin ([name]) Continue medications: Metformin 500mg  two tablets in am and one with dinner. Also taking glipizide 5mg  BID with food Steglatro 15mg  daily Check A1C at next follow up. Discussed general issues about diabetes pathophysiology and management. Education: Reviewed 'ABCs' of diabetes management (respective goals in parentheses):  A1C (<7), blood pressure (<130/80), and cholesterol (LDL <70) Dietary recommendations Encouraged aerobic exercise.  Discussed foot care, check daily Yearly retinal exam Dental exam every 6 months Monitor blood glucose, discussed goal for patient -     COMPLETE METABOLIC PANEL WITH GFR  Essential hypertension Continue current medications: Lisinopril 20mg , STOP HCTZ 25mg .  Has lost weight can try half tablet lisinopril with blood pressure monitoring record. Discussed B/P goals. Monitor blood pressure at home; call if consistently over 130/80 Continue DASH diet.   Reminder to go to the ER if any CP, SOB, nausea, dizziness, severe HA, changes vision/speech, left arm numbness and tingling and jaw pain.   Hyperlipidemia associated with type 2 diabetes mellitus ([name]) Discussed dietary and exercise modifications Low fat diet  Chronic obstructive pulmonary disease, unspecified COPD type ([name]) Doing well at this time No maintenance medications Continue to monitor  Medication management Continued    Continue diet and meds as discussed. Further disposition pending results of labs. Discussed med's effects and SE's.   Over 30 minutes of face to face interview, exam, counseling, chart review, and critical decision making was performed.   Future Appointments  Date Time Provider Department Center  05/11/2019  4:30 PM , NP  [facility] None  07/20/2019  4:00 PM , MD [facility] None    ----------------------------------------------------------------------------------------------------------------------  HPI 59 y.o. male  presents for 2 week follow up for diabetes and hypotension.  His metformin was increased to two tablets at breakfast and one with dinner.  Previously he was taking three tablets a day a typically did not take his afternoon dose.  He was taking glipizide once a day and this was increased to twice a day.  We also added steglatro he started with 5mg  for one week and reports he tolerated this well and denies any side effects.  Will increase to 15mg  daily and send in Rx.  He had he currently continues to smoke 1 pack a day x 45 years; discussed risks associated with smoking, patient is not ready to quit. last CXR was 09/2016.   BMI is There is no height or weight on file to calculate BMI., he has been working on diet and exercise. Does watch portions, walks around his office building on 10 min breaks Wt Readings from Last 3 Encounters:  04/20/19 264 lb (119.7 kg)  01/20/19 267 lb 12.8 oz (121.5 kg)  11/23/18 269 lb 3.2 oz (122.1 kg)   His blood pressure has been controlled at home, today their BP is    He does not workout. He denies chest pain, shortness of breath, dizziness.   He is on cholesterol medication (has been prescribed crestor 20 mg daily, but hasn't been taking)  and denies myalgias. His cholesterol is not at goal. The cholesterol last visit was:   Lab Results  Component Value Date   CHOL 178 04/20/2019   HDL 25 (L) 04/20/2019   LDLCALC 111 (H) 04/20/2019   TRIG 315 (H) 04/20/2019  CHOLHDL 7.1 (H) 04/20/2019    He has been working on diet and exercise for T2 diabetes  Neuropathy on gabapentin 1 am and 2 at night- states hands and feet Hyperlipidemia- stopped crestor 20 mg 3 days a week due to muscle aches.  ACE no CKD On Glipizide twice a day, no hypoglycemia, off  metformin.  denies foot ulcerations, increased appetite, nausea, polydipsia, polyuria, visual disturbances, vomiting and weight loss.  He does not currently check sugars, he does not have a machine.  Last A1C in the office was:  Lab Results  Component Value Date   HGBA1C 10.8 (H) 04/20/2019     Lab Results  Component Value Date   GFRNONAA 83 04/20/2019    Patient is on Vitamin D supplement and at goal at recent check:          Current Medications:  Current Outpatient Medications on File Prior to Visit  Medication Sig  . aspirin 81 MG tablet Take 81 mg by mouth daily.  . cephALEXin (KEFLEX) 500 MG capsule Take 1 capsule 4 x /day with Meals & Bedtime for Skin Infection  . Cholecalciferol 50000 units TABS Take 1 tablet by mouth 2 (two) times a week.   . citalopram (CELEXA) 40 MG tablet Take 1 tablet Daily for Mood  . fluconazole (DIFLUCAN) 150 MG tablet Take 1 tablet 2 x /week  if needed for yeast infection  . gabapentin (NEURONTIN) 800 MG tablet Take 1/2  to 1 tablet 3 to 4 x /day as needed for Diabetic Neuropathy  . glipiZIDE (GLUCOTROL) 5 MG tablet Take 1 tablet 3 x /day with Meals for Diabetes  . hydrochlorothiazide (HYDRODIURIL) 25 MG tablet Take 1 tablet by mouth once daily  . lisinopril (ZESTRIL) 20 MG tablet Take 1 tablet Daily for BP & Diabetic Kidney Protection  . meclizine (ANTIVERT) 25 MG tablet 1/2-1 pill up to 3 times daily for motion sickness/dizziness  . meloxicam (MOBIC) 15 MG tablet Take one daily with food for 2 weeks, can take with tylenol, can not take with aleve, iburpofen, then as needed daily for pain  . metFORMIN (GLUCOPHAGE-XR) 500 MG 24 hr tablet Take 1/2 to 1 or  2 tablets 2 x /day with Meals for Diabetes for Diabetes   No current facility-administered medications on file prior to visit.     Allergies:  Allergies  Allergen Reactions  . Invokana [Canagliflozin]     Extremity edema/caused pain  . Codeine Camsylate [Codeine] Rash  . Morphine And  Related Rash     Medical History:  Past Medical History:  Diagnosis Date  . Diabetic neuropathy ([place])   . History of hepatitis C   . History of kidney stones   . Hyperlipidemia   . Hypertension   . Other testicular hypofunction   . Type II or unspecified type diabetes mellitus without mention of complication, not stated as uncontrolled   . Vitamin D deficiency    Family history- Reviewed and unchanged Social history- Reviewed and unchanged   Review of Systems:  Review of Systems  Constitutional: Negative for malaise/fatigue and weight loss.  HENT: Negative for hearing loss and tinnitus.   Eyes: Negative for blurred vision and double vision.  Respiratory: Negative for cough, shortness of breath and wheezing.   Cardiovascular: Negative for chest pain, palpitations, orthopnea, claudication and leg swelling.  Gastrointestinal: Negative for abdominal pain, blood in stool, constipation, diarrhea, heartburn, melena, nausea and vomiting.  Genitourinary: Negative.   Musculoskeletal: Negative for joint pain and myalgias.  Skin: Negative for rash.  Neurological: Negative for dizziness, tingling, sensory change, weakness and headaches.  Endo/Heme/Allergies: Negative for polydipsia.  Psychiatric/Behavioral: Negative for depression, hallucinations, memory loss and substance abuse. The patient is not nervous/anxious.   All other systems reviewed and are negative.     Physical Exam: There were no vitals taken for this visit. Wt Readings from Last 3 Encounters:  04/20/19 264 lb (119.7 kg)  01/20/19 267 lb 12.8 oz (121.5 kg)  11/23/18 269 lb 3.2 oz (122.1 kg)   General Appearance: Well nourished, in no apparent distress. Eyes: PERRLA, EOMs, conjunctiva no swelling or erythema Sinuses: No Frontal/maxillary tenderness ENT/Mouth: Ext aud canals clear, TMs without erythema, bulging. No erythema, swelling, or exudate on post pharynx.  Tonsils not swollen or erythematous. Hearing normal.   Neck: Supple, thyroid normal.  Respiratory: Respiratory effort normal, BS equal bilaterally with diffuse wheezing without rales, rhonchi,  or stridor.  Cardio: RRR with no MRGs. Brisk peripheral pulses without edema.  Abdomen: Soft, + BS.  Non tender, no guarding, rebound, hernias, masses. Lymphatics: Non tender without lymphadenopathy.  Musculoskeletal: Full ROM, 5/5 strength, Normal gait Skin: Warm, dry without rashes, lesions, ecchymosis.  Neuro: Cranial nerves intact. No cerebellar symptoms.  Psych: Awake and oriented X 3, normal affect, Insight and Judgment appropriate.    Elder Negus, NP 5:39 PM [facility]

## 2019-05-11 ENCOUNTER — Other Ambulatory Visit: Payer: Self-pay

## 2019-05-11 ENCOUNTER — Ambulatory Visit: Payer: Managed Care, Other (non HMO) | Admitting: Adult Health Nurse Practitioner

## 2019-05-11 ENCOUNTER — Encounter: Payer: Self-pay | Admitting: Adult Health Nurse Practitioner

## 2019-05-11 VITALS — BP 130/74 | HR 83 | Temp 97.3°F | Wt 261.0 lb

## 2019-05-11 DIAGNOSIS — E1169 Type 2 diabetes mellitus with other specified complication: Secondary | ICD-10-CM | POA: Diagnosis not present

## 2019-05-11 DIAGNOSIS — Z79899 Other long term (current) drug therapy: Secondary | ICD-10-CM

## 2019-05-11 DIAGNOSIS — D72820 Lymphocytosis (symptomatic): Secondary | ICD-10-CM

## 2019-05-11 DIAGNOSIS — I10 Essential (primary) hypertension: Secondary | ICD-10-CM | POA: Diagnosis not present

## 2019-05-11 DIAGNOSIS — J449 Chronic obstructive pulmonary disease, unspecified: Secondary | ICD-10-CM

## 2019-05-11 DIAGNOSIS — N182 Chronic kidney disease, stage 2 (mild): Secondary | ICD-10-CM

## 2019-05-11 DIAGNOSIS — E785 Hyperlipidemia, unspecified: Secondary | ICD-10-CM | POA: Diagnosis not present

## 2019-05-11 DIAGNOSIS — E1122 Type 2 diabetes mellitus with diabetic chronic kidney disease: Secondary | ICD-10-CM | POA: Diagnosis not present

## 2019-05-11 NOTE — Patient Instructions (Addendum)
  STOP taking HCTZ .  Continue taking Lisinopril 20mg  daily.    Continue taking Steglatro 15mg  daily. Continue taking Metformin 5000mg  two tablets in morning and one with dinner.  Glipizide 5mg , take this twice a day with food.    We will follow up two weeks to check blood sugars and blood pressure.

## 2019-05-12 LAB — COMPLETE METABOLIC PANEL WITH GFR
AG Ratio: 1.3 (calc) (ref 1.0–2.5)
ALT: 31 U/L (ref 9–46)
AST: 20 U/L (ref 10–35)
Albumin: 3.7 g/dL (ref 3.6–5.1)
Alkaline phosphatase (APISO): 69 U/L (ref 35–144)
BUN: 24 mg/dL (ref 7–25)
CO2: 29 mmol/L (ref 20–32)
Calcium: 9.2 mg/dL (ref 8.6–10.3)
Chloride: 100 mmol/L (ref 98–110)
Creat: 0.95 mg/dL (ref 0.70–1.25)
GFR, Est African American: 100 mL/min/{1.73_m2} (ref >=60)
GFR, Est Non African American: 87 mL/min/{1.73_m2} (ref >=60)
Globulin: 2.8 g/dL (calc) (ref 1.9–3.7)
Glucose, Bld: 264 mg/dL — ABNORMAL HIGH (ref 65–99)
Potassium: 4.4 mmol/L (ref 3.5–5.3)
Sodium: 136 mmol/L (ref 135–146)
Total Bilirubin: 0.3 mg/dL (ref 0.2–1.2)
Total Protein: 6.5 g/dL (ref 6.1–8.1)

## 2019-05-24 MED ORDER — STEGLATRO 15 MG PO TABS: 15.0000 mg | ORAL_TABLET | Freq: Every day | ORAL | 3 refills | Status: DC

## 2019-05-27 ENCOUNTER — Other Ambulatory Visit: Payer: Self-pay | Admitting: Internal Medicine

## 2019-07-06 ENCOUNTER — Other Ambulatory Visit: Payer: Self-pay | Admitting: Internal Medicine

## 2019-07-06 DIAGNOSIS — N182 Chronic kidney disease, stage 2 (mild): Secondary | ICD-10-CM

## 2019-07-06 DIAGNOSIS — E1122 Type 2 diabetes mellitus with diabetic chronic kidney disease: Secondary | ICD-10-CM

## 2019-07-06 MED ORDER — GABAPENTIN 800 MG PO TABS: ORAL_TABLET | ORAL | 0 refills | Status: DC

## 2019-07-06 MED ORDER — GLIPIZIDE 5 MG PO TABS: ORAL_TABLET | ORAL | 0 refills | Status: DC

## 2019-07-08 ENCOUNTER — Other Ambulatory Visit: Payer: Self-pay | Admitting: Internal Medicine

## 2019-07-08 DIAGNOSIS — N182 Chronic kidney disease, stage 2 (mild): Secondary | ICD-10-CM

## 2019-07-16 ENCOUNTER — Encounter: Payer: Self-pay | Admitting: Physician Assistant

## 2019-07-16 ENCOUNTER — Other Ambulatory Visit: Payer: Self-pay

## 2019-07-16 ENCOUNTER — Ambulatory Visit: Payer: Self-pay

## 2019-07-16 ENCOUNTER — Encounter: Payer: Self-pay | Admitting: Adult Health

## 2019-07-16 ENCOUNTER — Ambulatory Visit: Payer: BC Managed Care – PPO | Admitting: Adult Health

## 2019-07-16 ENCOUNTER — Ambulatory Visit: Payer: BC Managed Care – PPO | Admitting: Physician Assistant

## 2019-07-16 VITALS — BP 120/60 | HR 69 | Temp 97.9°F | Ht 74.5 in | Wt 265.4 lb

## 2019-07-16 VITALS — Ht 74.5 in | Wt 265.0 lb

## 2019-07-16 DIAGNOSIS — E1140 Type 2 diabetes mellitus with diabetic neuropathy, unspecified: Secondary | ICD-10-CM | POA: Diagnosis not present

## 2019-07-16 DIAGNOSIS — L97419 Non-pressure chronic ulcer of right heel and midfoot with unspecified severity: Secondary | ICD-10-CM

## 2019-07-16 DIAGNOSIS — E1165 Type 2 diabetes mellitus with hyperglycemia: Secondary | ICD-10-CM | POA: Insufficient documentation

## 2019-07-16 DIAGNOSIS — M79671 Pain in right foot: Secondary | ICD-10-CM

## 2019-07-16 DIAGNOSIS — E11621 Type 2 diabetes mellitus with foot ulcer: Secondary | ICD-10-CM | POA: Diagnosis not present

## 2019-07-16 DIAGNOSIS — E1149 Type 2 diabetes mellitus with other diabetic neurological complication: Secondary | ICD-10-CM

## 2019-07-16 MED ORDER — CEPHALEXIN 500 MG PO CAPS: 500.0000 mg | ORAL_CAPSULE | Freq: Four times a day (QID) | ORAL | 0 refills | Status: DC

## 2019-07-16 MED ORDER — DOXYCYCLINE HYCLATE 100 MG PO TABS: 100.0000 mg | ORAL_TABLET | Freq: Two times a day (BID) | ORAL | 0 refills | Status: DC
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
## 2019-07-16 NOTE — Patient Instructions (Signed)
Dr. Meridee Score at [facility] ortho -   Avoid weight bearing on heel, monitor closely   Any worsening, redness, fever/chills - go to ED to be sure -   Start taking keflex 500 mg with each meal and bedtime with food   Cephalexin Tablets or Capsules What is this medicine? CEPHALEXIN (sef a LEX in) is a cephalosporin antibiotic. It treats some infections caused by bacteria. It will not work for colds, the flu, or other viruses. This medicine may be used for other purposes; ask your health care provider or pharmacist if you have questions. COMMON BRAND NAME(S): Biocef, Daxbia, Keflex, Keftab What should I tell my health care provider before I take this medicine? They need to know if you have any of these conditions:  kidney disease  stomach or intestine problems, especially colitis  an unusual or allergic reaction to cephalexin, other cephalosporins, penicillins, other antibiotics, medicines, foods, dyes or preservatives  pregnant or trying to get pregnant  breast-feeding How should I use this medicine? Take this drug by mouth. Take it as directed on the prescription label at the same time every day. You can take it with or without food. If it upsets your stomach, take it with food. Take all of this drug unless your health care provider tells you to stop it early. Keep taking it even if you think you are better. Talk to your health care provider about the use of this drug in children. While it may be prescribed for selected conditions, precautions do apply. Overdosage: If you think you have taken too much of this medicine contact a poison control center or emergency room at once. NOTE: This medicine is only for you. Do not share this medicine with others. What if I miss a dose? If you miss a dose, take it as soon as you can. If it is almost time for your next dose, take only that dose. Do not take double or extra doses. What may interact with this medicine?  probenecid  some other  antibiotics This list may not describe all possible interactions. Give your health care provider a list of all the medicines, herbs, non-prescription drugs, or dietary supplements you use. Also tell them if you smoke, drink alcohol, or use illegal drugs. Some items may interact with your medicine. What should I watch for while using this medicine? Tell your doctor or health care provider if your symptoms do not begin to improve in a few days. This medicine may cause serious skin reactions. They can happen weeks to months after starting the medicine. Contact your health care provider right away if you notice fevers or flu-like symptoms with a rash. The rash may be red or purple and then turn into blisters or peeling of the skin. Or, you might notice a red rash with swelling of the face, lips or lymph nodes in your neck or under your arms. Do not treat diarrhea with over the counter products. Contact your doctor if you have diarrhea that lasts more than 2 days or if it is severe and watery. If you have diabetes, you may get a false-positive result for sugar in your urine. Check with your doctor or health care provider. What side effects may I notice from receiving this medicine? Side effects that you should report to your doctor or health care professional as soon as possible:  allergic reactions like skin rash, itching or hives, swelling of the face, lips, or tongue  breathing problems  pain or trouble passing urine  redness, blistering, peeling or loosening of the skin, including inside the mouth  severe or watery diarrhea  unusually weak or tired  yellowing of the eyes, skin Side effects that usually do not require medical attention (report to your doctor or health care professional if they continue or are bothersome):  gas or heartburn  genital or anal irritation  headache  joint or muscle pain  nausea, vomiting This list may not describe all possible side effects. Call your  doctor for medical advice about side effects. You may report side effects to FDA at 1-800-FDA-1088. Where should I keep my medicine? Keep out of the reach of children and pets. Store at room temperature between 20 and 25 degrees C (68 and 77 degrees F). Throw away any unused drug after the expiration date. NOTE: This sheet is a summary. It may not cover all possible information. If you have questions about this medicine, talk to your doctor, pharmacist, or health care provider.  2020 Elsevier/Gold Standard (2018-08-14 11:27:00)

## 2019-07-18 ENCOUNTER — Encounter: Payer: Self-pay | Admitting: Internal Medicine

## 2019-07-18 NOTE — Progress Notes (Signed)
Annual  Screening/Preventative Visit  & Comprehensive Evaluation & Examination     This very nice 59 y.o.  MWM presents for a Screening /Preventative Visit & comprehensive evaluation and management of multiple medical co-morbidities.  Patient has been followed for HTN, HLD, T2_NIDDM  and Vitamin D Deficiency.     HTN predates since 31. Patient's BP has been controlled at home.  Today's BP is at goal - 122/56. Patient denies any cardiac symptoms as chest pain, palpitations, shortness of breath, dizziness or ankle swelling. Today's EKG shows unifocal bigeminal PVC's.      Patient's hyperlipidemia is not controlled with diet as he is reticent to take meds for cholesterol. Patient denies myalgias or other medication SE's. 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  Patient reports since starting Steglatro that his glucose have been running in the 130-140 mg% range.      Patient was recently referred to Orthopedics  for debridement of an ulcer of his Rt heel. 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 Last A1c was still not at goal:  Lab Results  Component Value Date   HGBA1C 10.8 (H) 04/20/2019        Patient has hx/o Low Testosterone Deficiency  for several years & he has declined replacement therapy.       Patient has history of Vitamin D Deficiency  ("24" / 2008)  and last vitamin D was at goal:  Lab Results  Component Value Date   VD25OH 62 04/20/2019    Current Outpatient Medications on File Prior to Visit  Medication Sig  . aspirin 81  MG tablet Take 81 mg by mouth daily.  . Cholecalciferol 50000 units TABS Take 1 tablet by mouth every other day.   . citalopram (CELEXA) 40 MG tablet Take 1 tablet Daily for Mood & Anxiety  . doxycycline (VIBRA-TABS) 100 MG tablet Take 1 tablet (100 mg total) by mouth 2 (two) times daily.  Marland Kitchen gabapentin (NEURONTIN) 800 MG tablet Take 1/2 to 1 tablet     2 to 3 x /day as needed for Neuropathy Pains  . glipiZIDE (GLUCOTROL) 5 MG tablet Take 1 tablet 3 x /day with Meals for Diabetes  . hydrochlorothiazide (HYDRODIURIL) 25 MG tablet Take 1 tablet by mouth once daily  . lisinopril (ZESTRIL) 20 MG tablet Take 1 tablet Daily for BP & Diabetic Kidney Protection  . meclizine (ANTIVERT) 25 MG tablet 1/2-1 pill up to 3 times daily for motion sickness/dizziness (Patient taking differently: daily. 1/2-1 pill up to 3 times daily for motion sickness/dizziness)  . meloxicam (MOBIC) 15 MG tablet Take one daily with food for 2 weeks, can take with tylenol, can not take with aleve, iburpofen, then as needed daily for pain  . metFORMIN (GLUCOPHAGE-XR) 500 MG 24 hr tablet Take 1/2 to 1 or  2 tablets 2 x /day with Meals for Diabetes for Diabetes  . STEGLATRO 15 MG TABS Take 15 mg by mouth daily before breakfast.  No current facility-administered medications on file prior to visit.   Allergies  Allergen Reactions  . Invokana [Canagliflozin]     Extremity edema/caused pain  . Codeine Camsylate [Codeine] Rash  . Morphine And Related Rash   Past Medical History:  Diagnosis Date  . Diabetic neuropathy ([place])   . History of hepatitis C   . History of kidney stones   . Hyperlipidemia   . Hypertension   . Other testicular hypofunction   . Type II or unspecified type diabetes mellitus without mention of complication, not stated as uncontrolled   . Vitamin D deficiency    Health Maintenance  Topic Date Due  . PNEUMOCOCCAL POLYSACCHARIDE VACCINE AGE 25-64 HIGH RISK  Never done  . OPHTHALMOLOGY EXAM  Never done  .  COVID-19 Vaccine (1) Never done  . COLONOSCOPY  Never done  . INFLUENZA VACCINE  08/22/2019  . HEMOGLOBIN A1C  10/21/2019  . FOOT EXAM  07/17/2020  . TETANUS/TDAP  12/26/2026  . Hepatitis C Screening  Completed  . HIV Screening  Completed   Immunization History  Administered Date(s) Administered  . PPD Test 08/31/2013, 11/08/2014, 04/03/2017, 07/14/2018  . Td 01/21/2005  . Tdap 12/25/2016   Last Colon - 2007 - Dr Arty Baumgartner - overdue 10 yr f/u (Patient aware overdue)   Past Surgical History:  Procedure Laterality Date  . CHOLECYSTECTOMY  1987  . ORIF TIBIA FRACTURE     Family History  Problem Relation Age of Onset  . Hypertension Mother   . Cancer Father        colon  . Alzheimer's disease Father   . Stroke Father    Social History   Socioeconomic History  . Marital status: Married    Spouse name: [name]  . Number of children: Not on file  Occupational History  .  Dispatcher for [employer]    Tobacco Use  . Smoking status: Current Every Day Smoker    Packs/day: 1.00    Types: Cigarettes  . Smokeless tobacco: Never Used  Substance and Sexual Activity  . Alcohol use: No  . Drug use: Yes    Types: Marijuana  . Sexual activity: Not on file     ROS Constitutional: Denies fever, chills, weight loss/gain, headaches, insomnia,  night sweats or change in appetite. Does c/o fatigue. Eyes: Denies redness, blurred vision, diplopia, discharge, itchy or watery eyes.  ENT: Denies discharge, congestion, post nasal drip, epistaxis, sore throat, earache, hearing loss, dental pain, Tinnitus, Vertigo, Sinus pain or snoring.  Cardio: Denies chest pain, palpitations, irregular heartbeat, syncope, dyspnea, diaphoresis, orthopnea, PND, claudication or edema Respiratory: denies cough, dyspnea, DOE, pleurisy, hoarseness, laryngitis or wheezing.  Gastrointestinal: Denies dysphagia, heartburn, reflux, water brash, pain, cramps, nausea, vomiting, bloating, diarrhea, constipation,  hematemesis, melena, hematochezia, jaundice or hemorrhoids Genitourinary: Denies dysuria, frequency, urgency, nocturia, hesitancy, discharge, hematuria or flank pain Musculoskeletal: Denies arthralgia, myalgia, stiffness, Jt. Swelling, pain, limp or strain/sprain. Denies Falls. Skin: Denies puritis, rash, hives, warts, acne, eczema or change in skin lesion Neuro: No weakness, tremor, incoordination, spasms, paresthesia or pain Psychiatric: Denies confusion, memory loss or sensory loss. Denies Depression. Endocrine: Denies change in weight, skin, hair change, nocturia, and paresthesia, diabetic polys, visual blurring or hyper / hypo glycemic episodes.  Heme/Lymph: No excessive bleeding, bruising or enlarged lymph nodes.  Physical Exam  BP (!) 122/56   Pulse 76   Temp 97.7 F (36.5 C)   Resp 18   Ht 6\' 2"  (1.88 m)   Wt 267  lb (121.1 kg)   BMI 34.28 kg/m   General Appearance: Well nourished and well groomed and in no apparent distress.  Eyes: PERRLA, EOMs, conjunctiva no swelling or erythema, normal fundi and vessels. Sinuses: No frontal/maxillary tenderness ENT/Mouth: EACs patent / TMs  nl. Nares clear without erythema, swelling, mucoid exudates. Oral hygiene is good. No erythema, swelling, or exudate. Tongue normal, non-obstructing. Tonsils not swollen or erythematous. Hearing normal.  Neck: Supple, thyroid not palpable. No bruits, nodes or JVD. Respiratory: Respiratory effort normal.  BS equal and clear bilateral without rales, rhonci, wheezing or stridor. Cardio: Heart sounds are normal with regular rate and rhythm and no murmurs, rubs or gallops. Peripheral pulses are normal and equal bilaterally without edema. No aortic or femoral bruits. Chest: symmetric with normal excursions and percussion.  Abdomen: Soft, with Nl bowel sounds. Nontender, no guarding, rebound, hernias, masses, or organomegaly.  Lymphatics: Non tender without lymphadenopathy.  Musculoskeletal: Full ROM all  peripheral extremities, joint stability, 5/5 strength, and normal gait. Skin: Warm and dry without rashes, lesions, cyanosis, clubbing or  ecchymosis.  Neuro: Cranial nerves intact, reflexes equal bilaterally. Normal muscle tone, no cerebellar symptoms. Sensation decreased in a stocking distribution to touch, vibratory and Monofilament to the toes bilaterally. Pysch: Alert and oriented X 3 with normal affect, insight and judgment appropriate.   Assessment and Plan  1. Annual Preventative/Screening Exam    2. Essential hypertension  - EKG 12-Lead - [place], RETROPERITNL ABD,  LTD - CBC with Differential/Platelet - COMPLETE METABOLIC PANEL WITH GFR - Magnesium - TSH  3. Hyperlipidemia associated with type 2 diabetes mellitus ([name])  - EKG 12-Lead - [place], RETROPERITNL ABD,  LTD - Lipid panel - TSH  4. Type 2 diabetes mellitus with stage 2 chronic kidney disease, without long-term current use of insulin ([name])  - EKG 12-Lead - [place], RETROPERITNL ABD,  LTD - Urinalysis, Routine w reflex microscopic - Microalbumin / creatinine urine ratio - Lipid panel - Hemoglobin A1c - Insulin, random  5. Vitamin D deficiency  - VITAMIN D 25 Hydroxy  6. Poorly controlled diabetes mellitus ([name])  - Hemoglobin A1c - Insulin, random  7. T2_NIDDM w/Peripheral Neuropathy  - HM DIABETES FOOT EXAM - LOW EXTREMITY NEUR EXAM DOCUM - Hemoglobin A1c - Insulin, random  8. Diabetic ulcer of right heel associated with type 2 diabetes mellitus, unspecified ulcer stage ([name])  - Hemoglobin A1c - Insulin, random  9. Frequent unifocal PVCs  - verapamil (CALAN-SR) 240 MG CR tablet; Take 1 tablet Daily with Food for BP & Heart  Rhythm  Dispense: 90 tablet; Refill: 1  - advised OV in 2 weeks to repeat EKG  10. Testosterone deficiency  - Testosterone  11. Morbid obesity (BMI 35)   12. Prostate cancer screening  - PSA  13. Screening for colorectal cancer  - POC Hemoccult Bld/Stl  14. Screening  examination for pulmonary tuberculosis  - TB Skin Test  15. Screening for ischemic heart disease  - EKG 12-Lead  16. FHx: heart disease  - EKG 12-Lead - [place], RETROPERITNL ABD,  LTD  17. Smoker  - EKG 12-Lead - [place], RETROPERITNL ABD,  LTD  18. Screening for AAA (aortic abdominal aneurysm)  - [place], RETROPERITNL ABD,  LTD  19. Fatigue  - Microalbumin / creatinine urine ratio - Iron,Total/Total Iron Binding Cap - Vitamin B12 - Testosterone - CBC with Differential/Platelet - TSH  20. Medication management  - CBC with Differential/Platelet - COMPLETE METABOLIC PANEL WITH GFR - Magnesium - Lipid panel -  TSH - Hemoglobin A1c - Insulin, random - VITAMIN D 25 Hydroxy         Patient was counseled in prudent diet, weight control to achieve/maintain BMI less than 25, BP monitoring, regular exercise and medications as discussed.  Discussed med effects and SE's. Routine screening labs and tests as requested with regular follow-up as recommended. Over 40 minutes of exam, counseling, chart review and high complex critical decision making was performed   Marinus Maw, MD

## 2019-07-18 NOTE — Patient Instructions (Addendum)
Due to recent changes in healthcare laws, you may see the results of your imaging and laboratory studies on MyChart before your provider has had a chance to review them.  We understand that in some cases there may be results that are confusing or concerning to you. Not all laboratory results come back in the same time frame and the provider may be waiting for multiple results in order to interpret others.  Please give [place] 48 hours in order for your provider to thoroughly review all the results before contacting the office for clarification of your results.   +++++++++++++++++++++++++++++++  Vit D  & Vit C 1,000 mg   are recommended to help protect  against the Covid-19 and other Corona viruses.    Also it's recommended  to take  Zinc 50 mg  to help  protect against the Covid-19   and best place to get  is also on [email or web address]  and don't pay more than 6-8 cents /pill !  ================================ Coronavirus (COVID-19) Are you at risk?  Are you at risk for the Coronavirus (COVID-19)?  To be considered HIGH RISK for Coronavirus (COVID-19), you have to meet the following criteria:  . Traveled to [place], [place], [place], [place] or [place]; or in the [place] to [place], [place], [place]  . or [place]; and have fever, cough, and shortness of breath within the last 2 weeks of travel OR . Been in close contact with a person diagnosed with COVID-19 within the last 2 weeks and have  . fever, cough,and shortness of breath .  . IF YOU DO NOT MEET THESE CRITERIA, YOU ARE CONSIDERED LOW RISK FOR COVID-19.  What to do if you are HIGH RISK for COVID-19?  Marland Kitchen If you are having a medical emergency, call 911. . Seek medical care right away. Before you go to a doctor's office, urgent care or emergency department, .  call ahead and tell them about your recent travel, contact with someone diagnosed with COVID-19  .  and your symptoms.  . You should receive instructions from your  physician's office regarding next steps of care.  . When you arrive at healthcare provider, tell the healthcare staff immediately you have returned from  . visiting [place], [place], [place], [place] or [place]; or traveled in the [place] to [place], [place],  . [place] or [place] in the last two weeks or you have been in close contact with a person diagnosed with  . COVID-19 in the last 2 weeks.   . Tell the health care staff about your symptoms: fever, cough and shortness of breath. . After you have been seen by a medical provider, you will be either: o Tested for (COVID-19) and discharged home on quarantine except to seek medical care if  o symptoms worsen, and asked to  - Stay home and avoid contact with others until you get your results (4-5 days)  - Avoid travel on public transportation if possible (such as bus, train, or airplane) or o Sent to the Emergency Department by EMS for evaluation, COVID-19 testing  and  o possible admission depending on your condition and test results.  What to do if you are LOW RISK for COVID-19?  Reduce your risk of any infection by using the same precautions used for avoiding the common cold or flu:  Marland Kitchen Wash your hands often with soap and warm water for at least 20 seconds.  If soap and water are not readily  available,  . use an alcohol-based hand sanitizer with at least 60% alcohol.  . If coughing or sneezing, cover your mouth and nose by coughing or sneezing into the elbow areas of your shirt or coat, .  into a tissue or into your sleeve (not your hands). . Avoid shaking hands with others and consider head nods or verbal greetings only. . Avoid touching your eyes, nose, or mouth with unwashed hands.  . Avoid close contact with people who are sick. . Avoid places or events with large numbers of people in one location, like concerts or sporting events. . Carefully consider travel plans you have or are making. . If you are planning any travel  outside or inside the [place], visit the CDC's Travelers' Health webpage for the latest health notices. . If you have some symptoms but not all symptoms, continue to monitor at home and seek medical attention  . if your symptoms worsen. . If you are having a medical emergency, call 911. >>>>>>>>>>>>>>>>>>>>>>>>>>>>>>>>>>>>>>>>>>>>>>>>>>>>>>>  We Do NOT Approve of [employer] > > > > > > > > > > > > > > > > > > > > > > > > > > > > > > > > > > >  > > > >   Preventive Care for Adults  A healthy lifestyle and preventive care can promote health and wellness. Preventive health guidelines for men include the following key practices:  A routine yearly physical is a good way to check with your health care provider about your health and preventative screening. It is a chance to share any concerns and updates on your health and to receive a thorough exam.  Visit your dentist for a routine exam and preventative care every 6 months. Brush your teeth twice a day and floss once a day. Good oral hygiene prevents tooth decay and gum disease.  The frequency of eye exams is based on your age, health, family medical history, use of contact lenses, and other factors. Follow your health care provider's recommendations for frequency of eye exams.  Eat a healthy diet. Foods such as vegetables, fruits, whole grains, low-fat dairy products, and lean protein foods contain the nutrients you need without too many calories. Decrease your intake of foods high in solid fats, added sugars, and salt. Eat the right amount of calories for you. Get information about a proper diet from your health care provider, if necessary.  Regular physical exercise is one of the most important things you can do for your health. Most adults should get at least 150 minutes of moderate-intensity exercise (any activity that increases your heart rate and causes you to sweat) each week. In addition, most adults need muscle-strengthening exercises  on 2 or more days a week.  Maintain a healthy weight. The body mass index (BMI) is a screening tool to identify possible weight problems. It provides an estimate of body fat based on height and weight. Your health care provider can find your BMI and can help you achieve or maintain a healthy weight. For adults 20 years and older:  A BMI below 18.5 is considered underweight.  A BMI of 18.5 to 24.9 is normal.  A BMI of 25 to 29.9 is considered overweight.  A BMI of 30 and above is considered obese.  Maintain normal blood lipids and cholesterol levels by exercising and minimizing your intake of saturated fat. Eat a balanced diet with plenty of fruit and vegetables. Blood tests for lipids and cholesterol  should begin at age 69 and be repeated every 5 years. If your lipid or cholesterol levels are high, you are over 50, or you are at high risk for heart disease, you may need your cholesterol levels checked more frequently. Ongoing high lipid and cholesterol levels should be treated with medicines if diet and exercise are not working.  If you smoke, find out from your health care provider how to quit. If you do not use tobacco, do not start.  Lung cancer screening is recommended for adults aged 55-80 years who are at high risk for developing lung cancer because of a history of smoking. A yearly low-dose CT scan of the lungs is recommended for people who have at least a 30-pack-year history of smoking and are a current smoker or have quit within the past 15 years. A pack year of smoking is smoking an average of 1 pack of cigarettes a day for 1 year (for example: 1 pack a day for 30 years or 2 packs a day for 15 years). Yearly screening should continue until the smoker has stopped smoking for at least 15 years. Yearly screening should be stopped for people who develop a health problem that would prevent them from having lung cancer treatment.  If you choose to drink alcohol, do not have more than 2 drinks  per day. One drink is considered to be 12 ounces (355 mL) of beer, 5 ounces (148 mL) of wine, or 1.5 ounces (44 mL) of liquor.  Avoid use of street drugs. Do not share needles with anyone. Ask for help if you need support or instructions about stopping the use of drugs.  High blood pressure causes heart disease and increases the risk of stroke. Your blood pressure should be checked at least every 1-2 years. Ongoing high blood pressure should be treated with medicines, if weight loss and exercise are not effective.  If you are 24-33 years old, ask your health care provider if you should take aspirin to prevent heart disease.  Diabetes screening involves taking a blood sample to check your fasting blood sugar level. Testing should be considered at a younger age or be carried out more frequently if you are overweight and have at least 1 risk factor for diabetes.  Colorectal cancer can be detected and often prevented. Most routine colorectal cancer screening begins at the age of 34 and continues through age 28. However, your health care provider may recommend screening at an earlier age if you have risk factors for colon cancer. On a yearly basis, your health care provider may provide home test kits to check for hidden blood in the stool. Use of a small camera at the end of a tube to directly examine the colon (sigmoidoscopy or colonoscopy) can detect the earliest forms of colorectal cancer. Talk to your health care provider about this at age 74, when routine screening begins. Direct exam of the colon should be repeated every 5-10 years through age 66, unless early forms of precancerous polyps or small growths are found.  Hepatitis C blood testing is recommended for all people born from [birth year] through 1967 and any individual with known risks for hepatitis C.  Screening for abdominal aortic aneurysm (AAA)  by ultrasound is recommended for people who have history of high blood pressure or who are current or  former smokers.  Healthy men should  receive prostate-specific antigen (PSA) blood tests as part of routine cancer screening. Talk with your health care provider about prostate cancer screening.  Testicular cancer screening is  recommended for adult males. Screening includes self-exam, a health care provider exam, and other screening tests. Consult with your health care provider about any symptoms you have or any concerns you have about testicular cancer.  Use sunscreen. Apply sunscreen liberally and repeatedly throughout the day. You should seek shade when your shadow is shorter than you. Protect yourself by wearing long sleeves, pants, a wide-brimmed hat, and sunglasses year round, whenever you are outdoors.  Once a month, do a whole-body skin exam, using a mirror to look at the skin on your back. Tell your health care provider about new moles, moles that have irregular borders, moles that are larger than a pencil eraser, or moles that have changed in shape or color.  Stay current with required vaccines (immunizations).  Influenza vaccine. All adults should be immunized every year.  Tetanus, diphtheria, and acellular pertussis (Td, Tdap) vaccine. An adult who has not previously received Tdap or who does not know his vaccine status should receive 1 dose of Tdap. This initial dose should be followed by tetanus and diphtheria toxoids (Td) booster doses every 10 years. Adults with an unknown or incomplete history of completing a 3-dose immunization series with Td-containing vaccines should begin or complete a primary immunization series including a Tdap dose. Adults should receive a Td booster every 10 years.  Zoster vaccine. One dose is recommended for adults aged 61 years or older unless certain conditions are present.    PREVNAR - Pneumococcal 13-valent conjugate (PCV13) vaccine. When indicated, a person who is uncertain of his immunization history and has no record of immunization should  receive the PCV13 vaccine. An adult aged 61 years or older who has certain medical conditions and has not been previously immunized should receive 1 dose of PCV13 vaccine. This PCV13 should be followed with a dose of pneumococcal polysaccharide (PPSV23) vaccine. The PPSV23 vaccine dose should be obtained 1 or more year(s)after the dose of PCV13 vaccine. An adult aged 61 years or older who has certain medical conditions and previously received 1 or more doses of PPSV23 vaccine should receive 1 dose of PCV13. The PCV13 vaccine dose should be obtained 1 or more years after the last PPSV23 vaccine dose.    PNEUMOVAX - Pneumococcal polysaccharide (PPSV23) vaccine. When PCV13 is also indicated, PCV13 should be obtained first. All adults aged 61 years and older should be immunized. An adult younger than age 61 years who has certain medical conditions should be immunized. Any person who resides in a nursing home or long-term care facility should be immunized. An adult smoker should be immunized. People with an immunocompromised condition and certain other conditions should receive both PCV13 and PPSV23 vaccines. People with human immunodeficiency virus (HIV) infection should be immunized as soon as possible after diagnosis. Immunization during chemotherapy or radiation therapy should be avoided. Routine use of PPSV23 vaccine is not recommended for [place] Indians, [address] [place], or people younger than 65 years unless there are medical conditions that require PPSV23 vaccine. When indicated, people who have unknown immunization and have no record of immunization should receive PPSV23 vaccine. One-time revaccination 5 years after the first dose of PPSV23 is recommended for people aged 19-64 years who have chronic kidney failure, nephrotic syndrome, asplenia, or immunocompromised conditions. People who received 1-2 doses of PPSV23 before age 61 years should receive another dose of PPSV23 vaccine at age 61 years or later  if at least 5 years have passed since the previous dose. Doses of  PPSV23 are not needed for people immunized with PPSV23 at or after age 2 years.    Hepatitis A vaccine. Adults who wish to be protected from this disease, have certain high-risk conditions, work with hepatitis A-infected animals, work in hepatitis A research labs, or travel to or work in countries with a high rate of hepatitis A should be immunized. Adults who were previously unvaccinated and who anticipate close contact with an international adoptee during the first 60 days after arrival in the [place] from a country with a high rate of hepatitis A should be immunized.    Hepatitis B vaccine. Adults should be immunized if they wish to be protected from this disease, have certain high-risk conditions, may be exposed to blood or other infectious body fluids, are household contacts or sex partners of hepatitis B positive people, are clients or workers in certain care facilities, or travel to or work in countries with a high rate of hepatitis B.   Preventive Service / Frequency   Ages 67 and over  Blood pressure check.  Lipid and cholesterol check.  Lung cancer screening. / Every year if you are aged 55-80 years and have a 30-pack-year history of smoking and currently smoke or have quit within the past 15 years. Yearly screening is stopped once you have quit smoking for at least 15 years or develop a health problem that would prevent you from having lung cancer treatment.  Fecal occult blood test (FOBT) of stool. You may not have to do this test if you get a colonoscopy every 10 years.  Flexible sigmoidoscopy** or colonoscopy.** / Every 5 years for a flexible sigmoidoscopy or every 10 years for a colonoscopy beginning at age 5 and continuing until age 66.  Hepatitis C blood test.** / For all people born from [birth year] through 1967 and any individual with known risks for hepatitis C.  Abdominal aortic aneurysm (AAA)  screening./ Screening current or former smokers or have Hypertension.  Skin self-exam. / Monthly.  Influenza vaccine. / Every year.  Tetanus, diphtheria, and acellular pertussis (Tdap/Td) vaccine.** / 1 dose of Td every 10 years.   Zoster vaccine.** / 1 dose for adults aged 59 years or older.         Pneumococcal 13-valent conjugate (PCV13) vaccine.    Pneumococcal polysaccharide (PPSV23) vaccine.     Hepatitis A vaccine.** / Consult your health care provider.  Hepatitis B vaccine.** / Consult your health care provider. Screening for abdominal aortic aneurysm (AAA)  by ultrasound is recommended for people who have history of high blood pressure or who are current or former smokers.   +++++++++++++++++++++++++++++++   Being diabetic has a  300% increased risk for heart attack,  stroke, cancer, and alzheimer- type vascular dementia.   It is very important that you work harder with diet by  avoiding all foods that are white except chicken,   fish & calliflower.  - Avoid white rice  (brown & wild rice is OK),   - Avoid white potatoes  (sweet potatoes in moderation is OK),   White bread or wheat bread or anything made out of   white flour like bagels, donuts, rolls, buns, biscuits, cakes,  - pastries, cookies, pizza crust, and pasta (made from  white flour & egg whites)   - vegetarian pasta or spinach or wheat pasta is OK.  - Multigrain breads like Arnold's, Pepperidge Farm or   multigrain sandwich thins or high fiber breads like   [place] bread or "Dave's  Killer" breads that are  4 to 5 grams fiber per slice !  are best.    Diet, exercise and weight loss can reverse and cure  diabetes in the early stages.    - Diet, exercise and weight loss is very important in the   control and prevention of complications of diabetes which  affects every system in your body, ie.   -Brain - dementia/stroke,  - eyes - glaucoma/blindness,  - heart - heart attack/heart  failure,  - kidneys - dialysis,  - stomach - gastric paralysis,  - intestines - malabsorption,  - nerves - severe painful neuritis,  - circulation - gangrene & loss of a leg(s)  - and finally  . . . . . . . . . . . . . . . . . .    - cancer and Alzheimers.  +++++++++++++++++++++++++++++++ Recommend Adult Low Dose Aspirin or  coated  Aspirin 81 mg daily  To reduce risk of Colon Cancer 40 %,  Skin Cancer 26 % ,  Malignant Melanoma 46%  and  Pancreatic cancer 60% ++++++++++++++++++++++ Vitamin D goal  is between 70-100.  Please make sure that you are taking your Vitamin D as directed.  It is very important as a natural anti-inflammatory  helping hair, skin, and nails, as well as reducing stroke and heart attack risk.  It helps your bones and helps with mood. It also decreases numerous cancer risks so please take it as directed.  Low Vit D is associated with a 200-300% higher risk for CANCER  and 200-300% higher risk for HEART   ATTACK  &  STROKE.   .....................................Marland Kitchen It is also associated with higher death rate at younger ages,  autoimmune diseases like Rheumatoid arthritis, Lupus, Multiple Sclerosis.    Also many other serious conditions, like depression, Alzheimer's Dementia, infertility, muscle aches, fatigue, fibromyalgia - just to name a few. ++++++++++++++++++++++ Recommend the book "The END of DIETING" by Dr Excell Seltzer  & the book "The END of DIABETES " by Dr Excell Seltzer At [email or web address] - get book & Audio CD's    Being diabetic has a  300% increased risk for heart attack, stroke, cancer, and alzheimer- type vascular dementia. It is very important that you work harder with diet by avoiding all foods that are white. Avoid white rice (brown & wild rice is OK), white potatoes (sweetpotatoes in moderation is OK), White bread or wheat bread or anything made out of white flour like bagels, donuts, rolls, buns, biscuits, cakes, pastries, cookies, pizza crust, and  pasta (made from white flour & egg whites) - vegetarian pasta or spinach or wheat pasta is OK. Multigrain breads like Arnold's or Pepperidge Farm, or multigrain sandwich thins or flatbreads.  Diet, exercise and weight loss can reverse and cure diabetes in the early stages.  Diet, exercise and weight loss is very important in the control and prevention of complications of diabetes which affects every system in your body, ie. Brain - dementia/stroke, eyes - glaucoma/blindness, heart - heart attack/heart failure, kidneys - dialysis, stomach - gastric paralysis, intestines - malabsorption, nerves - severe painful neuritis, circulation - gangrene & loss of a leg(s), and finally cancer and Alzheimers.    I recommend avoid fried & greasy foods,  sweets/candy, white rice (brown or wild rice or Quinoa is OK), white potatoes (sweet potatoes are OK) - anything made from white flour - bagels, doughnuts, rolls, buns, biscuits,white and wheat breads, pizza crust and traditional pasta made of white  flour & egg white(vegetarian pasta or spinach or wheat pasta is OK).  Multi-grain bread is OK - like multi-grain flat bread or sandwich thins. Avoid alcohol in excess. Exercise is also important.    Eat all the vegetables you want - avoid meat, especially red meat and dairy - especially cheese.  Cheese is the most concentrated form of trans-fats which is the worst thing to clog up our arteries. Veggie cheese is OK which can be found in the fresh produce section at [employer] or [employer] or [employer]  ++++++++++++++++++++++ DASH Eating Plan  DASH stands for "Dietary Approaches to Stop Hypertension."   The DASH eating plan is a healthy eating plan that has been shown to reduce high blood pressure (hypertension). Additional health benefits may include reducing the risk of type 2 diabetes mellitus, heart disease, and stroke. The DASH eating plan may also help with weight loss. WHAT DO I NEED TO KNOW ABOUT THE DASH EATING  PLAN? For the DASH eating plan, you will follow these general guidelines:  Choose foods with a percent daily value for sodium of less than 5% (as listed on the food label).  Use salt-free seasonings or herbs instead of table salt or sea salt.  Check with your health care provider or pharmacist before using salt substitutes.  Eat lower-sodium products, often labeled as "lower sodium" or "no salt added."  Eat fresh foods.  Eat more vegetables, fruits, and low-fat dairy products.  Choose whole grains. Look for the word "whole" as the first word in the ingredient list.  Choose fish   Limit sweets, desserts, sugars, and sugary drinks.  Choose heart-healthy fats.  Eat veggie cheese   Eat more home-cooked food and less restaurant, buffet, and fast food.  Limit fried foods.  Cook foods using methods other than frying.  Limit canned vegetables. If you do use them, rinse them well to decrease the sodium.  When eating at a restaurant, ask that your food be prepared with less salt, or no salt if possible.                      WHAT FOODS CAN I EAT? Read Dr Francis Dowse Fuhrman's books on The End of Dieting & The End of Diabetes  Grains Whole grain or whole wheat bread. Brown rice. Whole grain or whole wheat pasta. Quinoa, bulgur, and whole grain cereals. Low-sodium cereals. Corn or whole wheat flour tortillas. Whole grain cornbread. Whole grain crackers. Low-sodium crackers.  Vegetables Fresh or frozen vegetables (raw, steamed, roasted, or grilled). Low-sodium or reduced-sodium tomato and vegetable juices. Low-sodium or reduced-sodium tomato sauce and paste. Low-sodium or reduced-sodium canned vegetables.   Fruits All fresh, canned (in natural juice), or frozen fruits.  Protein Products  All fish and seafood.  Dried beans, peas, or lentils. Unsalted nuts and seeds. Unsalted canned beans.  Dairy Low-fat dairy products, such as skim or 1% milk, 2% or reduced-fat cheeses, low-fat ricotta  or cottage cheese, or plain low-fat yogurt. Low-sodium or reduced-sodium cheeses.  Fats and Oils Tub margarines without trans fats. Light or reduced-fat mayonnaise and salad dressings (reduced sodium). Avocado. Safflower, olive, or canola oils. Natural peanut or almond butter.  Other Unsalted popcorn and pretzels. The items listed above may not be a complete list of recommended foods or beverages. Contact your dietitian for more options.  ++++++++++++++++++++  WHAT FOODS ARE NOT RECOMMENDED? Grains/ White flour or wheat flour White bread. White pasta. White rice. Refined cornbread. Bagels and croissants. Crackers  that contain trans fat.  Vegetables  Creamed or fried vegetables. Vegetables in a . Regular canned vegetables. Regular canned tomato sauce and paste. Regular tomato and vegetable juices.  Fruits Dried fruits. Canned fruit in light or heavy syrup. Fruit juice.  Meat and Other Protein Products Meat in general - RED meat & White meat.  Fatty cuts of meat. Ribs, chicken wings, all processed meats as bacon, sausage, bologna, salami, fatback, hot dogs, bratwurst and packaged luncheon meats.  Dairy Whole or 2% milk, cream, half-and-half, and cream cheese. Whole-fat or sweetened yogurt. Full-fat cheeses or blue cheese. Non-dairy creamers and whipped toppings. Processed cheese, cheese spreads, or cheese curds.  Condiments Onion and garlic salt, seasoned salt, table salt, and sea salt. Canned and packaged gravies. Worcestershire sauce. Tartar sauce. Barbecue sauce. Teriyaki sauce. Soy sauce, including reduced sodium. Steak sauce. Fish sauce. Oyster sauce. Cocktail sauce. Horseradish. Ketchup and mustard. Meat flavorings and tenderizers. Bouillon cubes. Hot sauce. Tabasco sauce. Marinades. Taco seasonings. Relishes.  Fats and Oils Butter, stick margarine, lard, shortening and bacon fat. Coconut, palm kernel, or palm oils. Regular salad dressings.  Pickles and olives. Salted popcorn  and pretzels.  The items listed above may not be a complete list of foods and beverages to avoid.

## 2019-07-19 ENCOUNTER — Ambulatory Visit (INDEPENDENT_AMBULATORY_CARE_PROVIDER_SITE_OTHER): Payer: BC Managed Care – PPO | Admitting: Internal Medicine

## 2019-07-19 ENCOUNTER — Other Ambulatory Visit: Payer: Self-pay

## 2019-07-19 VITALS — BP 122/56 | HR 76 | Temp 97.7°F | Resp 18 | Ht 74.0 in | Wt 267.0 lb

## 2019-07-19 DIAGNOSIS — Z1389 Encounter for screening for other disorder: Secondary | ICD-10-CM | POA: Diagnosis not present

## 2019-07-19 DIAGNOSIS — I10 Essential (primary) hypertension: Secondary | ICD-10-CM

## 2019-07-19 DIAGNOSIS — Z1329 Encounter for screening for other suspected endocrine disorder: Secondary | ICD-10-CM | POA: Diagnosis not present

## 2019-07-19 DIAGNOSIS — R350 Frequency of micturition: Secondary | ICD-10-CM | POA: Diagnosis not present

## 2019-07-19 DIAGNOSIS — Z125 Encounter for screening for malignant neoplasm of prostate: Secondary | ICD-10-CM | POA: Diagnosis not present

## 2019-07-19 DIAGNOSIS — Z131 Encounter for screening for diabetes mellitus: Secondary | ICD-10-CM | POA: Diagnosis not present

## 2019-07-19 DIAGNOSIS — L97419 Non-pressure chronic ulcer of right heel and midfoot with unspecified severity: Secondary | ICD-10-CM

## 2019-07-19 DIAGNOSIS — Z13220 Encounter for screening for lipoid disorders: Secondary | ICD-10-CM

## 2019-07-19 DIAGNOSIS — Z136 Encounter for screening for cardiovascular disorders: Secondary | ICD-10-CM | POA: Diagnosis not present

## 2019-07-19 DIAGNOSIS — R5383 Other fatigue: Secondary | ICD-10-CM

## 2019-07-19 DIAGNOSIS — Z111 Encounter for screening for respiratory tuberculosis: Secondary | ICD-10-CM | POA: Diagnosis not present

## 2019-07-19 DIAGNOSIS — Z1211 Encounter for screening for malignant neoplasm of colon: Secondary | ICD-10-CM

## 2019-07-19 DIAGNOSIS — Z0000 Encounter for general adult medical examination without abnormal findings: Secondary | ICD-10-CM

## 2019-07-19 DIAGNOSIS — I493 Ventricular premature depolarization: Secondary | ICD-10-CM

## 2019-07-19 DIAGNOSIS — E1140 Type 2 diabetes mellitus with diabetic neuropathy, unspecified: Secondary | ICD-10-CM

## 2019-07-19 DIAGNOSIS — N401 Enlarged prostate with lower urinary tract symptoms: Secondary | ICD-10-CM | POA: Diagnosis not present

## 2019-07-19 DIAGNOSIS — E1169 Type 2 diabetes mellitus with other specified complication: Secondary | ICD-10-CM

## 2019-07-19 DIAGNOSIS — Z8249 Family history of ischemic heart disease and other diseases of the circulatory system: Secondary | ICD-10-CM

## 2019-07-19 DIAGNOSIS — E559 Vitamin D deficiency, unspecified: Secondary | ICD-10-CM

## 2019-07-19 DIAGNOSIS — Z130 Encounter for screening for diseases of the blood and blood-forming organs and certain disorders involving the immune mechanism: Secondary | ICD-10-CM

## 2019-07-19 DIAGNOSIS — F17200 Nicotine dependence, unspecified, uncomplicated: Secondary | ICD-10-CM

## 2019-07-19 DIAGNOSIS — Z79899 Other long term (current) drug therapy: Secondary | ICD-10-CM

## 2019-07-19 DIAGNOSIS — E1165 Type 2 diabetes mellitus with hyperglycemia: Secondary | ICD-10-CM

## 2019-07-19 DIAGNOSIS — Z0001 Encounter for general adult medical examination with abnormal findings: Secondary | ICD-10-CM

## 2019-07-19 DIAGNOSIS — E1122 Type 2 diabetes mellitus with diabetic chronic kidney disease: Secondary | ICD-10-CM

## 2019-07-19 DIAGNOSIS — E349 Endocrine disorder, unspecified: Secondary | ICD-10-CM

## 2019-07-19 MED ORDER — VERAPAMIL HCL ER 240 MG PO TBCR: EXTENDED_RELEASE_TABLET | ORAL | 1 refills | Status: DC

## 2019-07-20 ENCOUNTER — Encounter: Payer: Managed Care, Other (non HMO) | Admitting: Internal Medicine

## 2019-07-20 LAB — COMPLETE METABOLIC PANEL WITH GFR
AG Ratio: 1.4 (calc) (ref 1.0–2.5)
ALT: 21 U/L (ref 9–46)
AST: 16 U/L (ref 10–35)
Albumin: 3.9 g/dL (ref 3.6–5.1)
Alkaline phosphatase (APISO): 68 U/L (ref 35–144)
BUN: 20 mg/dL (ref 7–25)
CO2: 28 mmol/L (ref 20–32)
Calcium: 9.8 mg/dL (ref 8.6–10.3)
Chloride: 101 mmol/L (ref 98–110)
Creat: 0.91 mg/dL (ref 0.70–1.25)
GFR, Est African American: 106 mL/min/{1.73_m2} (ref >=60)
GFR, Est Non African American: 91 mL/min/{1.73_m2} (ref >=60)
Globulin: 2.8 g/dL (calc) (ref 1.9–3.7)
Glucose, Bld: 192 mg/dL — ABNORMAL HIGH (ref 65–99)
Potassium: 5 mmol/L (ref 3.5–5.3)
Sodium: 138 mmol/L (ref 135–146)
Total Bilirubin: 0.4 mg/dL (ref 0.2–1.2)
Total Protein: 6.7 g/dL (ref 6.1–8.1)

## 2019-07-20 LAB — CBC WITH DIFFERENTIAL/PLATELET
Absolute Monocytes: 927 cells/uL (ref 200–950)
Basophils Absolute: 82 cells/uL (ref 0–200)
Basophils Relative: 0.8 %
Eosinophils Absolute: 134 cells/uL (ref 15–500)
Eosinophils Relative: 1.3 %
HCT: 48.9 % (ref 38.5–50.0)
Hemoglobin: 16.5 g/dL (ref 13.2–17.1)
Lymphs Abs: 3368 cells/uL (ref 850–3900)
MCH: 32.4 pg (ref 27.0–33.0)
MCHC: 33.7 g/dL (ref 32.0–36.0)
MCV: 96.1 fL (ref 80.0–100.0)
MPV: 11.9 fL (ref 7.5–12.5)
Monocytes Relative: 9 %
Neutro Abs: 5789 cells/uL (ref 1500–7800)
Neutrophils Relative %: 56.2 %
Platelets: 203 10*3/uL (ref 140–400)
RBC: 5.09 10*6/uL (ref 4.20–5.80)
RDW: 12.7 % (ref 11.0–15.0)
Total Lymphocyte: 32.7 %
WBC: 10.3 10*3/uL (ref 3.8–10.8)

## 2019-07-20 LAB — URINALYSIS, ROUTINE W REFLEX MICROSCOPIC
Bilirubin Urine: NEGATIVE
Hgb urine dipstick: NEGATIVE
Ketones, ur: NEGATIVE
Leukocytes,Ua: NEGATIVE
Nitrite: NEGATIVE
Protein, ur: NEGATIVE
Specific Gravity, Urine: 1.032 (ref 1.001–1.03)
pH: <=5 (ref 5.0–8.0)

## 2019-07-20 LAB — PSA: PSA: 1.2 ng/mL (ref <=4.0)

## 2019-07-20 LAB — HEMOGLOBIN A1C
Hgb A1c MFr Bld: 6.7 % of total Hgb — ABNORMAL HIGH (ref <5.7)
Mean Plasma Glucose: 146 (calc)
eAG (mmol/L): 8.1 (calc)

## 2019-07-20 LAB — MICROALBUMIN / CREATININE URINE RATIO
Creatinine, Urine: 83 mg/dL (ref 20–320)
Microalb Creat Ratio: 5 mcg/mg creat (ref <30)
Microalb, Ur: 0.4 mg/dL

## 2019-07-20 LAB — LIPID PANEL
Cholesterol: 184 mg/dL (ref <200)
HDL: 29 mg/dL — ABNORMAL LOW (ref >=40)
LDL Cholesterol (Calc): 104 mg/dL (calc) — ABNORMAL HIGH
Non-HDL Cholesterol (Calc): 155 mg/dL (calc) — ABNORMAL HIGH (ref <130)
Total CHOL/HDL Ratio: 6.3 (calc) — ABNORMAL HIGH (ref <5.0)
Triglycerides: 384 mg/dL — ABNORMAL HIGH (ref <150)

## 2019-07-20 LAB — INSULIN, RANDOM: Insulin: 98 u[IU]/mL — ABNORMAL HIGH

## 2019-07-20 LAB — IRON, TOTAL/TOTAL IRON BINDING CAP
%SAT: 25 % (calc) (ref 20–48)
Iron: 72 ug/dL (ref 50–180)
TIBC: 291 mcg/dL (calc) (ref 250–425)

## 2019-07-20 LAB — TSH: TSH: 2.15 mIU/L (ref 0.40–4.50)

## 2019-07-20 LAB — VITAMIN D 25 HYDROXY (VIT D DEFICIENCY, FRACTURES): Vit D, 25-Hydroxy: 109 ng/mL — ABNORMAL HIGH (ref 30–100)

## 2019-07-20 LAB — VITAMIN B12: Vitamin B-12: 343 pg/mL (ref 200–1100)

## 2019-07-20 LAB — TESTOSTERONE: Testosterone: 119 ng/dL — ABNORMAL LOW (ref 250–827)

## 2019-07-20 LAB — MAGNESIUM: Magnesium: 2 mg/dL (ref 1.5–2.5)

## 2019-07-21 ENCOUNTER — Other Ambulatory Visit: Payer: Self-pay | Admitting: Internal Medicine

## 2019-07-21 DIAGNOSIS — R42 Dizziness and giddiness: Secondary | ICD-10-CM

## 2019-07-21 LAB — TB SKIN TEST
Induration: 0 mm
TB Skin Test: NEGATIVE

## 2019-07-21 MED ORDER — MECLIZINE HCL 25 MG PO TABS: ORAL_TABLET | ORAL | 2 refills | Status: DC

## 2019-07-22 ENCOUNTER — Ambulatory Visit: Payer: BC Managed Care – PPO | Admitting: Orthopedic Surgery

## 2019-07-22 ENCOUNTER — Other Ambulatory Visit: Payer: Self-pay

## 2019-07-22 ENCOUNTER — Encounter: Payer: Self-pay | Admitting: Physician Assistant

## 2019-07-22 VITALS — Ht 74.0 in | Wt 267.0 lb

## 2019-07-22 DIAGNOSIS — M86271 Subacute osteomyelitis, right ankle and foot: Secondary | ICD-10-CM

## 2019-07-22 DIAGNOSIS — F17200 Nicotine dependence, unspecified, uncomplicated: Secondary | ICD-10-CM | POA: Diagnosis not present

## 2019-07-22 DIAGNOSIS — I739 Peripheral vascular disease, unspecified: Secondary | ICD-10-CM | POA: Diagnosis not present

## 2019-07-22 DIAGNOSIS — L97411 Non-pressure chronic ulcer of right heel and midfoot limited to breakdown of skin: Secondary | ICD-10-CM

## 2019-07-22 DIAGNOSIS — E1142 Type 2 diabetes mellitus with diabetic polyneuropathy: Secondary | ICD-10-CM | POA: Diagnosis not present
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
## 2019-07-23 ENCOUNTER — Telehealth: Payer: Self-pay

## 2019-07-23 NOTE — Telephone Encounter (Signed)
Patient walked in and asked for MRI for his foot, Rx for Iodosorb and OOW note for his job.  MRI for right ankle has been ordered on 07/22/2019. I will call patient back and notify him of this and his work note. Patient does not need anymore Iodosorb for his ankle or heel per Dr Audrie Lia assistant confirms. I tried to call patient back to seek understanding of length of time to be out but did not get an answer. Will try again.

## 2019-07-27 ENCOUNTER — Other Ambulatory Visit: Payer: Self-pay | Admitting: Orthopedic Surgery

## 2019-07-27 ENCOUNTER — Telehealth: Payer: Self-pay | Admitting: Orthopedic Surgery

## 2019-07-27 MED ORDER — SILVER SULFADIAZINE 1 % EX CREA
1.0000 | TOPICAL_CREAM | Freq: Every day | CUTANEOUS | 3 refills | Status: DC
Start: 2019-07-27 — End: 2019-08-20

## 2019-07-27 NOTE — Telephone Encounter (Signed)
Rx sent to his pharmacy for silvadine cream, apply to wound daily with dry dressing after washing with soap and water

## 2019-07-27 NOTE — Telephone Encounter (Signed)
Dr Lajoyce Corners please advise patient was seen in the office on 07/22/2019. Is it okay for patient to remain out of work until his MRI is reviewed; it is scheduled for 08/08/2019. Patient would also like Iodosorb ointment for right foot but you did not specify any ointments in notes.Thank you

## 2019-07-27 NOTE — Telephone Encounter (Signed)
Patient's wife called.  Says Dr. Lajoyce Corners did not call in the ointment for him to use. Would like someone to call her back. Her call back number is [phone]

## 2019-07-28 NOTE — Telephone Encounter (Signed)
Patient was called and lvm informing of silvadene cream and note ready at front desk.

## 2019-08-03 NOTE — Progress Notes (Signed)
History of Present Illness:     This nice 59 yo WM with labile HTN, poorly controlled DM returns for EKG with recent EKG 2 weeks ago showing bigeminal PVC's & patient was started on Verapamil.   Patient denies any awareness of palpitations and cardiac systems review is negative. Recent A1c 6.7% was markedly improved from previous A1c 10.8% 3 months earlier.   Medications  Current Outpatient Medications (Endocrine & Metabolic):  .  glipiZIDE (GLUCOTROL) 5 MG tablet, Take 1 tablet 3 x /day with Meals for Diabetes .  metFORMIN (GLUCOPHAGE-XR) 500 MG 24 hr tablet, Take 1/2 to 1 or  2 tablets 2 x /day with Meals for Diabetes for Diabetes .  STEGLATRO 15 MG TABS, Take 15 mg by mouth daily before breakfast.  Current Outpatient Medications (Cardiovascular):  .  hydrochlorothiazide (HYDRODIURIL) 25 MG tablet, Take 1 tablet by mouth once daily .  lisinopril (ZESTRIL) 20 MG tablet, Take 1 tablet Daily for BP & Diabetic Kidney Protection .  verapamil (CALAN-SR) 240 MG CR tablet, Take 1 tablet Daily with Food for BP & Heart  Rhythm   Current Outpatient Medications (Analgesics):  .  aspirin 81 MG tablet, Take 81 mg by mouth daily. .  meloxicam (MOBIC) 15 MG tablet, Take one daily with food for 2 weeks, can take with tylenol, can not take with aleve, iburpofen, then as needed daily for pain   Current Outpatient Medications (Other):  Marland Kitchen  Cholecalciferol 50000 units TABS, Take 1 tablet by mouth every other day.  .  citalopram (CELEXA) 40 MG tablet, Take 1 tablet Daily for Mood & Anxiety .  doxycycline (VIBRA-TABS) 100 MG tablet, Take 1 tablet (100 mg total) by mouth 2 (two) times daily. Marland Kitchen  gabapentin (NEURONTIN) 800 MG tablet, Take 1/2 to 1 tablet     2 to 3 x /day as needed for Neuropathy Pains .  meclizine (ANTIVERT) 25 MG tablet, 1/2-1 pill up to 3 times daily for motion sickness/dizziness .  silver sulfADIAZINE (SILVADENE) 1 % cream, Apply 1 application topically daily. Apply to affected area daily  plus dry dressing  Problem list He has Essential hypertension; Hyperlipidemia associated with type 2 diabetes mellitus ([name]); Vitamin D deficiency; History of kidney stones; Testosterone deficiency; History of hepatitis C; Medication management; Morbid obesity (BMI 35); CKD stage 2 due to type 2 diabetes mellitus ([name]); T2_NIDDM w/Peripheral Neuropathy; Smoker; COPD (chronic obstructive pulmonary disease) ([name]); Insomnia; FHx: heart disease; Type 2 diabetes mellitus with stage 2 chronic kidney disease, without long-term current use of insulin ([name]); Diabetic ulcer of right heel associated with type 2 diabetes mellitus ([name]); and Poorly controlled diabetes mellitus ([name]) on their problem list.   Observations/Objective:  BP 124/76   Pulse 76   Temp (!) 97.2 F (36.2 C)   Resp 16   Ht 6\' 2"  (1.88 m)   Wt 261 lb (118.4 kg)   BMI 33.51 kg/m   HEENT - WNL. Neck - supple.  Chest - Clear equal BS. Cor - Nl HS. RRR w/o sig MGR. PP 1(+). No edema. MS- FROM w/o deformities.  Gait Nl. Neuro -  Nl w/o focal abnormalities.  EKG shows NSR w/o ectopy  Assessment and Plan:  1. Essential hypertension  - EKG 12-Lead  2. Type 2 diabetes mellitus with stage 2 chronic kidney disease, without long-term current use of insulin ([name])  - EKG 12-Lead  3. Frequent unifocal PVCs  - EKG 12-Lead  Follow Up Instructions:       I  discussed the assessment and treatment plan with the patient. The patient was provided an opportunity to ask questions and all were answered. The patient agreed with the plan and demonstrated an understanding of the instructions.       The patient was advised to call back or seek an in-person evaluation if the symptoms worsen or if the condition fails to improve as anticipated.   Kirtland Bouchard, MD

## 2019-08-04 ENCOUNTER — Ambulatory Visit: Payer: BC Managed Care – PPO | Admitting: Internal Medicine

## 2019-08-04 ENCOUNTER — Other Ambulatory Visit: Payer: Self-pay

## 2019-08-04 ENCOUNTER — Encounter: Payer: Self-pay | Admitting: Internal Medicine

## 2019-08-04 VITALS — BP 124/76 | HR 76 | Temp 97.2°F | Resp 16 | Ht 74.0 in | Wt 261.0 lb

## 2019-08-04 DIAGNOSIS — I10 Essential (primary) hypertension: Secondary | ICD-10-CM

## 2019-08-04 DIAGNOSIS — I493 Ventricular premature depolarization: Secondary | ICD-10-CM | POA: Diagnosis not present

## 2019-08-04 DIAGNOSIS — E1122 Type 2 diabetes mellitus with diabetic chronic kidney disease: Secondary | ICD-10-CM | POA: Diagnosis not present

## 2019-08-04 DIAGNOSIS — N182 Chronic kidney disease, stage 2 (mild): Secondary | ICD-10-CM | POA: Diagnosis not present

## 2019-08-08 ENCOUNTER — Ambulatory Visit
Admission: RE | Admit: 2019-08-08 | Discharge: 2019-08-08 | Disposition: A | Discharge status: Home / Self Care | Payer: BC Managed Care – PPO | Source: Ambulatory Visit | Attending: Orthopedic Surgery | Admitting: Orthopedic Surgery

## 2019-08-08 DIAGNOSIS — L97519 Non-pressure chronic ulcer of other part of right foot with unspecified severity: Secondary | ICD-10-CM | POA: Diagnosis not present

## 2019-08-08 DIAGNOSIS — R600 Localized edema: Secondary | ICD-10-CM | POA: Diagnosis not present

## 2019-08-08 DIAGNOSIS — M19071 Primary osteoarthritis, right ankle and foot: Secondary | ICD-10-CM | POA: Diagnosis not present

## 2019-08-08 DIAGNOSIS — M86271 Subacute osteomyelitis, right ankle and foot: Secondary | ICD-10-CM

## 2019-08-11 ENCOUNTER — Ambulatory Visit: Payer: BC Managed Care – PPO | Admitting: Family

## 2019-08-11 ENCOUNTER — Encounter: Payer: Self-pay | Admitting: Family

## 2019-08-11 ENCOUNTER — Other Ambulatory Visit: Payer: Self-pay

## 2019-08-11 DIAGNOSIS — E1142 Type 2 diabetes mellitus with diabetic polyneuropathy: Secondary | ICD-10-CM | POA: Diagnosis not present

## 2019-08-11 DIAGNOSIS — I739 Peripheral vascular disease, unspecified: Secondary | ICD-10-CM

## 2019-08-11 DIAGNOSIS — L97411 Non-pressure chronic ulcer of right heel and midfoot limited to breakdown of skin: Secondary | ICD-10-CM | POA: Diagnosis not present
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
## 2019-08-18 ENCOUNTER — Encounter: Payer: Self-pay | Admitting: Family

## 2019-08-18 ENCOUNTER — Ambulatory Visit: Payer: BC Managed Care – PPO | Admitting: Family

## 2019-08-18 VITALS — Ht 74.0 in | Wt 261.0 lb

## 2019-08-18 DIAGNOSIS — L97411 Non-pressure chronic ulcer of right heel and midfoot limited to breakdown of skin: Secondary | ICD-10-CM | POA: Diagnosis not present

## 2019-08-19 ENCOUNTER — Ambulatory Visit (INDEPENDENT_AMBULATORY_CARE_PROVIDER_SITE_OTHER): Payer: BC Managed Care – PPO | Admitting: Orthopedic Surgery

## 2019-08-19 ENCOUNTER — Encounter (HOSPITAL_COMMUNITY): Payer: Self-pay | Admitting: Orthopedic Surgery

## 2019-08-19 ENCOUNTER — Telehealth: Payer: Self-pay

## 2019-08-19 ENCOUNTER — Other Ambulatory Visit: Payer: Self-pay | Admitting: Physician Assistant

## 2019-08-19 ENCOUNTER — Encounter: Payer: Self-pay | Admitting: Orthopedic Surgery

## 2019-08-19 ENCOUNTER — Other Ambulatory Visit: Payer: Self-pay

## 2019-08-19 VITALS — Ht 74.0 in | Wt 261.0 lb

## 2019-08-19 DIAGNOSIS — L97414 Non-pressure chronic ulcer of right heel and midfoot with necrosis of bone: Secondary | ICD-10-CM

## 2019-08-19 NOTE — Progress Notes (Addendum)
Spoke with pt for pre-op call. Pt has hx of bigeminy, HTN and Diabetes. Last A1C was 6.7 on 07/19/19. States his fasting blood sugar is usually between 116-122. Instructed pt not to take his Glipizide tonight or in the AM. He is not to take his Metformin and Stelgrato in the AM. Instructed pt to check his blood sugar when he gets up in the AM and every 2 hours until he leaves for the hospital. If blood sugar is 70 or below, treat with 1/2 cup of clear juice (apple or cranberry) and recheck blood sugar 15 minutes after drinking juice. If blood sugar continues to be 70 or below, call the Short Stay department and ask to speak to a nurse. Pt voiced understanding  Covid test to be done on arrival to Short Stay.  Reviewed most recent EKG with Dr. Aleene Davidson (bigeminy). Pt has been started on Verapamil since this EKG was done in June. Dr. Sampson Goon ordered an EKG for day of surgery.

## 2019-08-19 NOTE — Progress Notes (Signed)
Office Visit Note   Patient: [name]           Date of Birth: [birth year]           MRN: [id number] Visit Date: 08/19/2019              Requested by: Lucky Cowboy, MD [address] [place],  [place] [address] PCP: Lucky Cowboy, MD  Chief Complaint  Patient presents with  . Right Foot - Pain, Follow-up    Re-eval of right heel      HPI: Patient is a 59 year old gentleman who presents in follow-up for right heel ulcer.  He has had progressive necrotic changes to the heel ulcer.  Patient presents today with nausea and vomiting.  Assessment & Plan: Visit Diagnoses:  1. 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  We needs to admit him to ensure compliance with postoperative care.  Discussed the importance of strict smoking cessation strict nonweightbearing on the righ,.  Discussed that with smoking patient is at increased risk of the wound not healing and increased risk for right transtibial amputation.  Patient states he understands wished to proceed with foot salvage intervention at this time.  Follow-Up Instructions: Return in about 1 week (around 08/26/2019).   Ortho Exam  Patient is alert, oriented, no adenopathy, well-dressed, normal affect, normal respiratory effort. Examination patient has a progressive necrotic ulcer over the right calcaneus.  The ulcer is 10 mm in diameter 10 mm deep and probes down to the calcaneus there is necrotic tissue involving the entire ulcer.  A Doppler was used and patient has a strong dorsalis pedis and posterior tibial biphasic pulse.  Ankle-brachial indices in 2018 also showed good circulation with biphasic flow.  Patient's vital signs in the office her temperature 101.2 blood  pressure 131/81 pulse 86 and a blood sugar of 182.  Patient states that he is still smoking.  Imaging: No results found. No images are attached to the encounter.  Labs: Lab Results  Component Value Date   HGBA1C 6.7 (H) 07/19/2019   HGBA1C 10.8 (H) 04/20/2019   HGBA1C 12.0 (H) 01/20/2019     Lab Results  Component Value Date   ALBUMIN 4.1 06/11/2016   ALBUMIN 3.9 03/20/2016   ALBUMIN 4.1 12/04/2015    Lab Results  Component Value Date   MG 2.0 07/19/2019   MG 2.1 01/20/2019   MG 2.4 10/21/2018   Lab Results  Component Value Date   VD25OH 109 (H) 07/19/2019   VD25OH 62 04/20/2019   VD25OH 51 01/20/2019    No results found for: PREALBUMIN CBC EXTENDED Latest Ref Rng & Units 07/19/2019 04/20/2019 01/20/2019  WBC 3.8 - 10.8 Thousand/uL 10.3 12.8(H) 11.8(H)  RBC 4.20 - 5.80 Million/uL 5.09 4.75 5.19  HGB 13.2 - 17.1 g/dL 85.2 77.8 17.4(H)  HCT 38 - 50 % 48.9 45.2 49.4  PLT 140 - 400 Thousand/uL 203 191 191  NEUTROABS 1,500 - 7,800 cells/uL 5,789 7,488 6,714  LYMPHSABS 850 - 3,900 cells/uL 3,368 4,096(H) 3,776     Body mass index is 33.51 kg/m.  Orders:  No orders of the defined types were placed in this encounter.  No orders of the defined types were placed in this encounter.  Procedures: No procedures performed  Clinical Data: No additional findings.  ROS:  All other systems negative, except as noted in the HPI. Review of Systems  Objective: Vital Signs: Ht 6\' 2"  (1.88 m)   Wt (!) 261 lb (118.4 kg)   BMI 33.51 kg/m   Specialty Comments:  No specialty comments available.  PMFS History: Patient Active Problem List   Diagnosis Date Noted  . Diabetic ulcer of right heel associated with type 2 diabetes mellitus ([name]) 07/16/2019  . Poorly controlled diabetes mellitus ([name]) 07/16/2019  . FHx: heart disease 10/08/2017  . Type 2 diabetes mellitus with stage 2 chronic kidney disease, without long-term current use of insulin ([name]) 10/08/2017  .  Insomnia 07/08/2017  . Smoker 08/17/2015  . COPD (chronic obstructive pulmonary disease) ([name]) 08/17/2015  . 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 Medication management 05/19/2013  . Morbid obesity (BMI 35) 05/19/2013  . Essential hypertension   . Hyperlipidemia associated with type 2 diabetes mellitus ([name])   . Vitamin D deficiency   . History of kidney stones   . Testosterone deficiency   . History of hepatitis C    Past Medical History:  Diagnosis Date  . Diabetic neuropathy ([name])   . History of hepatitis C   . History of kidney stones   . Hyperlipidemia   . Hypertension   . Other testicular hypofunction   . Type II or unspecified type diabetes mellitus without mention of complication, not stated as uncontrolled   . Vitamin D deficiency     Family History  Problem Relation Age of Onset  . Hypertension Mother   . Cancer Father        colon  . Alzheimer's disease Father   . Stroke Father     Past Surgical History:  Procedure Laterality Date  . CHOLECYSTECTOMY  1987  . ORIF TIBIA FRACTURE     Social History   Occupational History  . Not on file  Tobacco Use  . Smoking status: Current Every Day Smoker    Packs/day: 1.00    Types: Cigarettes  . Smokeless tobacco: Never Used  Substance and Sexual Activity  . Alcohol use: No  . Drug use: Yes    Types: Marijuana  . Sexual activity: Not on file

## 2019-08-19 NOTE — Telephone Encounter (Signed)
I called pt [phone] which was given as the best contact number to advise that Dr. Lajoyce Corners would like to see him in the office today before posting for surgery tomorrow for I&D of heel. Will continue to reach out to pt to have him come in at any time today,

## 2019-08-19 NOTE — Telephone Encounter (Signed)
Pt coming in today at 3 pm today

## 2019-08-20 ENCOUNTER — Other Ambulatory Visit (HOSPITAL_COMMUNITY): Payer: BC Managed Care – PPO

## 2019-08-20 ENCOUNTER — Encounter (HOSPITAL_COMMUNITY): Payer: Self-pay | Admitting: Orthopedic Surgery

## 2019-08-20 ENCOUNTER — Encounter (HOSPITAL_COMMUNITY)
Admission: RE | Disposition: A | Discharge status: Home / Self Care | Payer: Self-pay | Source: Home / Self Care | Attending: Orthopedic Surgery

## 2019-08-20 ENCOUNTER — Other Ambulatory Visit: Payer: Self-pay

## 2019-08-20 ENCOUNTER — Inpatient Hospital Stay (HOSPITAL_COMMUNITY): Payer: BC Managed Care – PPO | Admitting: Anesthesiology

## 2019-08-20 ENCOUNTER — Ambulatory Visit (HOSPITAL_COMMUNITY)
Admission: RE | Admit: 2019-08-20 | Discharge: 2019-08-20 | Disposition: A | Discharge status: Home / Self Care | Payer: BC Managed Care – PPO | Attending: Orthopedic Surgery | Admitting: Orthopedic Surgery

## 2019-08-20 DIAGNOSIS — E785 Hyperlipidemia, unspecified: Secondary | ICD-10-CM | POA: Diagnosis not present

## 2019-08-20 DIAGNOSIS — G8918 Other acute postprocedural pain: Secondary | ICD-10-CM | POA: Diagnosis not present

## 2019-08-20 DIAGNOSIS — Z8619 Personal history of other infectious and parasitic diseases: Secondary | ICD-10-CM | POA: Insufficient documentation

## 2019-08-20 DIAGNOSIS — Z8249 Family history of ischemic heart disease and other diseases of the circulatory system: Secondary | ICD-10-CM | POA: Diagnosis not present

## 2019-08-20 DIAGNOSIS — E11621 Type 2 diabetes mellitus with foot ulcer: Secondary | ICD-10-CM | POA: Insufficient documentation

## 2019-08-20 DIAGNOSIS — Z20822 Contact with and (suspected) exposure to covid-19: Secondary | ICD-10-CM | POA: Diagnosis not present

## 2019-08-20 DIAGNOSIS — F17210 Nicotine dependence, cigarettes, uncomplicated: Secondary | ICD-10-CM | POA: Diagnosis not present

## 2019-08-20 DIAGNOSIS — Z885 Allergy status to narcotic agent status: Secondary | ICD-10-CM | POA: Diagnosis not present

## 2019-08-20 DIAGNOSIS — Z888 Allergy status to other drugs, medicaments and biological substances status: Secondary | ICD-10-CM | POA: Insufficient documentation

## 2019-08-20 DIAGNOSIS — L97414 Non-pressure chronic ulcer of right heel and midfoot with necrosis of bone: Secondary | ICD-10-CM | POA: Insufficient documentation

## 2019-08-20 DIAGNOSIS — E1140 Type 2 diabetes mellitus with diabetic neuropathy, unspecified: Secondary | ICD-10-CM | POA: Insufficient documentation

## 2019-08-20 DIAGNOSIS — I10 Essential (primary) hypertension: Secondary | ICD-10-CM | POA: Insufficient documentation

## 2019-08-20 DIAGNOSIS — L02611 Cutaneous abscess of right foot: Secondary | ICD-10-CM | POA: Diagnosis not present

## 2019-08-20 DIAGNOSIS — M869 Osteomyelitis, unspecified: Secondary | ICD-10-CM | POA: Diagnosis not present

## 2019-08-20 DIAGNOSIS — E1169 Type 2 diabetes mellitus with other specified complication: Secondary | ICD-10-CM | POA: Insufficient documentation

## 2019-08-20 DIAGNOSIS — M86171 Other acute osteomyelitis, right ankle and foot: Secondary | ICD-10-CM | POA: Insufficient documentation

## 2019-08-20 HISTORY — PX: I & D EXTREMITY: SHX5045

## 2019-08-20 HISTORY — DX: Diverticulitis of intestine, part unspecified, without perforation or abscess without bleeding: K57.92

## 2019-08-20 LAB — COMPREHENSIVE METABOLIC PANEL
ALT: 48 U/L — ABNORMAL HIGH (ref 0–44)
AST: 39 U/L (ref 15–41)
Albumin: 2.8 g/dL — ABNORMAL LOW (ref 3.5–5.0)
Alkaline Phosphatase: 62 U/L (ref 38–126)
Anion gap: 13 (ref 5–15)
BUN: 15 mg/dL (ref 6–20)
CO2: 21 mmol/L — ABNORMAL LOW (ref 22–32)
Calcium: 8.9 mg/dL (ref 8.9–10.3)
Chloride: 96 mmol/L — ABNORMAL LOW (ref 98–111)
Creatinine, Ser: 0.97 mg/dL (ref 0.61–1.24)
GFR calc Af Amer: >60 mL/min (ref >60)
GFR calc non Af Amer: >60 mL/min (ref >60)
Glucose, Bld: 163 mg/dL — ABNORMAL HIGH (ref 70–99)
Potassium: 4.3 mmol/L (ref 3.5–5.1)
Sodium: 130 mmol/L — ABNORMAL LOW (ref 135–145)
Total Bilirubin: 1.1 mg/dL (ref 0.3–1.2)
Total Protein: 7.2 g/dL (ref 6.5–8.1)

## 2019-08-20 LAB — SARS CORONAVIRUS 2 BY RT PCR (DIASORIN): SARS Coronavirus 2: NEGATIVE

## 2019-08-20 LAB — GLUCOSE, CAPILLARY
Glucose-Capillary: 218 mg/dL — ABNORMAL HIGH (ref 70–99)
Glucose-Capillary: 136 mg/dL — ABNORMAL HIGH (ref 70–99)

## 2019-08-20 SURGERY — IRRIGATION AND DEBRIDEMENT EXTREMITY
Anesthesia: General | Laterality: Right

## 2019-08-20 MED ORDER — LACTATED RINGERS IV SOLN: INTRAVENOUS | Status: DC

## 2019-08-20 MED ORDER — CEFAZOLIN SODIUM-DEXTROSE 2-3 GM-%(50ML) IV SOLR: INTRAVENOUS | Status: DC | PRN

## 2019-08-20 MED ORDER — FENTANYL CITRATE (PF) 100 MCG/2ML IJ SOLN: 25.0000 ug | INTRAMUSCULAR | Status: DC | PRN

## 2019-08-20 MED ORDER — MIDAZOLAM HCL 2 MG/2ML IJ SOLN
INTRAMUSCULAR | Status: AC
  Administered 2019-08-20: 1 mg via INTRAVENOUS
  Filled 2019-08-20: qty 2

## 2019-08-20 MED ORDER — PROPOFOL 10 MG/ML IV BOLUS
INTRAVENOUS | Status: AC
  Filled 2019-08-20: qty 20

## 2019-08-20 MED ORDER — LACTATED RINGERS IV SOLN: INTRAVENOUS | Status: DC | PRN

## 2019-08-20 MED ORDER — CEFAZOLIN SODIUM-DEXTROSE 2-4 GM/100ML-% IV SOLN: 2.0000 g | INTRAVENOUS | Status: DC

## 2019-08-20 MED ORDER — FENTANYL CITRATE (PF) 250 MCG/5ML IJ SOLN
INTRAMUSCULAR | Status: AC
  Filled 2019-08-20: qty 5

## 2019-08-20 MED ORDER — ONDANSETRON HCL 4 MG/2ML IJ SOLN
INTRAMUSCULAR | Status: AC
  Filled 2019-08-20: qty 2

## 2019-08-20 MED ORDER — 0.9 % SODIUM CHLORIDE (POUR BTL) OPTIME
TOPICAL | Status: DC | PRN
  Administered 2019-08-20: 1000 mL

## 2019-08-20 MED ORDER — OXYCODONE-ACETAMINOPHEN 5-325 MG PO TABS
1.0000 | ORAL_TABLET | ORAL | 0 refills | Status: DC | PRN
Start: 2019-08-20 — End: 2019-09-03

## 2019-08-20 MED ORDER — FENTANYL CITRATE (PF) 100 MCG/2ML IJ SOLN
INTRAMUSCULAR | Status: AC
  Administered 2019-08-20: 100 ug via INTRAVENOUS
  Filled 2019-08-20: qty 2

## 2019-08-20 MED ORDER — CEFAZOLIN SODIUM-DEXTROSE 2-3 GM-%(50ML) IV SOLR
INTRAVENOUS | Status: DC | PRN
  Administered 2019-08-20: 2 g via INTRAVENOUS

## 2019-08-20 MED ORDER — CHLORHEXIDINE GLUCONATE 0.12 % MT SOLN
OROMUCOSAL | Status: AC
  Administered 2019-08-20: 15 mL
  Filled 2019-08-20: qty 15

## 2019-08-20 MED ORDER — PROPOFOL 500 MG/50ML IV EMUL
INTRAVENOUS | Status: DC | PRN
  Administered 2019-08-20: 75 ug/kg/min via INTRAVENOUS

## 2019-08-20 MED ORDER — ONDANSETRON HCL 4 MG/2ML IJ SOLN: 4.0000 mg | Freq: Once | INTRAMUSCULAR | Status: DC | PRN

## 2019-08-20 MED ORDER — FENTANYL CITRATE (PF) 100 MCG/2ML IJ SOLN: 100.0000 ug | Freq: Once | INTRAMUSCULAR | Status: AC

## 2019-08-20 MED ORDER — MIDAZOLAM HCL 2 MG/2ML IJ SOLN: 1.0000 mg | Freq: Once | INTRAMUSCULAR | Status: AC

## 2019-08-20 MED ORDER — MIDAZOLAM HCL 2 MG/2ML IJ SOLN
INTRAMUSCULAR | Status: AC
  Filled 2019-08-20: qty 2

## 2019-08-20 MED ORDER — ROPIVACAINE HCL 5 MG/ML IJ SOLN
INTRAMUSCULAR | Status: DC | PRN
Start: 2019-08-20 — End: 2019-08-20
  Administered 2019-08-20: 30 mL via EPIDURAL

## 2019-08-20 SURGICAL SUPPLY — 34 items
BLADE SURG 21 STRL SS (BLADE) ×2 IMPLANT
BNDG COHESIVE 6X5 TAN NS LF (GAUZE/BANDAGES/DRESSINGS) ×1 IMPLANT
BNDG COHESIVE 6X5 TAN STRL LF (GAUZE/BANDAGES/DRESSINGS) IMPLANT
BNDG GAUZE ELAST 4 BULKY (GAUZE/BANDAGES/DRESSINGS) ×4 IMPLANT
COVER SURGICAL LIGHT HANDLE (MISCELLANEOUS) ×4 IMPLANT
COVER WAND RF STERILE (DRAPES) IMPLANT
DRAPE U-SHAPE 47X51 STRL (DRAPES) ×2 IMPLANT
DRSG ADAPTIC 3X8 NADH LF (GAUZE/BANDAGES/DRESSINGS) ×2 IMPLANT
DRSG PAD ABDOMINAL 8X10 ST (GAUZE/BANDAGES/DRESSINGS) ×1 IMPLANT
DURAPREP 26ML APPLICATOR (WOUND CARE) ×2 IMPLANT
ELECT REM PT RETURN 9FT ADLT (ELECTROSURGICAL)
ELECTRODE REM PT RTRN 9FT ADLT (ELECTROSURGICAL) IMPLANT
GAUZE SPONGE 4X4 12PLY STRL (GAUZE/BANDAGES/DRESSINGS) ×2 IMPLANT
GAUZE SPONGE 4X4 12PLY STRL LF (GAUZE/BANDAGES/DRESSINGS) ×1 IMPLANT
GLOVE BIOGEL PI IND STRL 9 (GLOVE) ×1 IMPLANT
GLOVE BIOGEL PI INDICATOR 9 (GLOVE) ×1
GLOVE SURG ORTHO 9.0 STRL STRW (GLOVE) ×2 IMPLANT
GOWN STRL REUS W/ TWL XL LVL3 (GOWN DISPOSABLE) ×2 IMPLANT
GOWN STRL REUS W/TWL XL LVL3 (GOWN DISPOSABLE) ×4
HANDPIECE INTERPULSE COAX TIP (DISPOSABLE)
KIT BASIN OR (CUSTOM PROCEDURE TRAY) ×2 IMPLANT
KIT TURNOVER KIT B (KITS) ×2 IMPLANT
MANIFOLD NEPTUNE II (INSTRUMENTS) ×2 IMPLANT
NS IRRIG 1000ML POUR BTL (IV SOLUTION) ×2 IMPLANT
PACK ORTHO EXTREMITY (CUSTOM PROCEDURE TRAY) ×2 IMPLANT
PAD ARMBOARD 7.5X6 YLW CONV (MISCELLANEOUS) ×4 IMPLANT
SET HNDPC FAN SPRY TIP SCT (DISPOSABLE) IMPLANT
STOCKINETTE IMPERVIOUS 9X36 MD (GAUZE/BANDAGES/DRESSINGS) IMPLANT
SUT ETHILON 2 0 PSLX (SUTURE) ×2 IMPLANT
SWAB COLLECTION DEVICE MRSA (MISCELLANEOUS) ×2 IMPLANT
SWAB CULTURE ESWAB REG 1ML (MISCELLANEOUS) IMPLANT
TOWEL GREEN STERILE (TOWEL DISPOSABLE) ×2 IMPLANT
TUBE CONNECTING 12X1/4 (SUCTIONS) ×2 IMPLANT
YANKAUER SUCT BULB TIP NO VENT (SUCTIONS) ×2 IMPLANT

## 2019-08-20 NOTE — Transfer of Care (Signed)
Immediate Anesthesia Transfer of Care Note  Patient: [name]  Procedure(s) Performed: IRRIGATION & DEBRIDEMENT RIGHT FOOT PARTIAL CALCANEAL EXCISION (Right )  Patient Location: PACU  Anesthesia Type:MAC combined with regional for post-op pain  Level of Consciousness: awake, alert  and oriented  Airway & Oxygen Therapy: Patient Spontanous Breathing  Post-op Assessment: Report given to RN and Post -op Vital signs reviewed and stable  Post vital signs: Reviewed and stable  Last Vitals:  Vitals Value Taken Time  BP 114/71 08/20/19 1710  Temp    Pulse 73 08/20/19 1710  Resp 11 08/20/19 1710  SpO2 97 % 08/20/19 1710  Vitals shown include unvalidated device data.  Last Pain:  Vitals:   08/20/19 1342  TempSrc:   PainSc: 6       Patients Stated Pain Goal: 3 (22/33/61 2244)  Complications: No complications documented.

## 2019-08-20 NOTE — H&P (Signed)
[name] is an 59 y.o. male.   Chief Complaint: Right heel Pain HPI: HPI: Patient is a 59 year old gentleman who presents in follow-up for right heel ulcer.  He has had progressive necrotic changes to the heel ulcer.  Patient presents today with nausea and vomiting.  Past Medical History:  Diagnosis Date  . Diabetic neuropathy ([name])   . Diverticulitis   . History of hepatitis C    has been treated in the past  . History of kidney stones   . Hyperlipidemia   . Hypertension   . Other testicular hypofunction   . Type II or unspecified type diabetes mellitus without mention of complication, not stated as uncontrolled   . Vitamin D deficiency     Past Surgical History:  Procedure Laterality Date  . CHOLECYSTECTOMY  1987  . COLONOSCOPY    . ORIF TIBIA FRACTURE     x 3 right leg    Family History  Problem Relation Age of Onset  . Hypertension Mother   . Cancer Father        colon  . Alzheimer's disease Father   . Stroke Father    Social History:  reports that he has been smoking cigarettes. He has been smoking about 1.00 pack per day. He has never used smokeless tobacco. He reports current drug use. Drug: Marijuana. He reports that he does not drink alcohol.  Allergies:  Allergies  Allergen Reactions  . Invokana [Canagliflozin]     Extremity edema/caused pain  . Codeine Camsylate [Codeine] Rash  . Morphine And Related Rash    No medications prior to admission.    No results found for this or any previous visit (from the past 48 hour(s)). No results found.  Review of Systems  All other systems reviewed and are negative.   There were no vitals taken for this visit. Physical Exam  Patient is alert, oriented, no adenopathy, well-dressed, normal affect, normal respiratory effort. Examination patient has a progressive necrotic ulcer over the right calcaneus.  The ulcer is 10 mm in diameter 10 mm deep and probes down to the calcaneus there is necrotic tissue  involving the entire ulcer.  A Doppler was used and patient has a strong dorsalis pedis and posterior tibial biphasic pulse.  Ankle-brachial indices in 2018 also showed good circulation with biphasic flow. Heart RRR Lungs Clear  Patient's vital signs in the office her temperature 101.2 blood pressure 131/81 pulse 86 and a blood sugar of 182.  Patient states that he is still smoking. Assessment/Plan Diagnoses:  1. 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  We needs to admit him to ensure compliance with postoperative care.  Discussed the importance of strict smoking cessation strict nonweightbearing on the righ,.  Discussed that with smoking patient is at increased risk of the wound not healing and increased risk for right transtibial amputation.  Patient states he understands wished to proceed with foot salvage intervention at this time.   [place] [name], PA 08/20/2019, 6:40 AM

## 2019-08-20 NOTE — Anesthesia Preprocedure Evaluation (Addendum)
Anesthesia Evaluation  Patient identified by MRN, date of birth, ID band Patient awake    Reviewed: Allergy & Precautions, NPO status , Patient's Chart, lab work & pertinent test results, reviewed documented beta blocker date and time   Airway Mallampati: II  TM Distance: >3 FB Neck ROM: Full    Dental   Pulmonary COPD,  COPD inhaler, Current Smoker and Patient abstained from smoking.,    Pulmonary exam normal breath sounds clear to auscultation + decreased breath sounds      Cardiovascular hypertension, Pt. on medications Normal cardiovascular exam Rhythm:Regular Rate:Normal     Neuro/Psych Diabetic peripheral neuropathy negative psych ROS   GI/Hepatic (+) Hepatitis -, CHas been treated in the past Diverticulosis   Endo/Other  diabetes, Poorly Controlled, Type 2, Oral Hypoglycemic AgentsHyperlipidemia  Renal/GU Renal diseaseHx/o renal calculi  negative genitourinary   Musculoskeletal Necrotic right calcaneal ulcer   Abdominal (+) + obese,   Peds  Hematology negative hematology ROS (+)   Anesthesia Other Findings   Reproductive/Obstetrics                             Anesthesia Physical Anesthesia Plan  ASA: III  Anesthesia Plan: MAC and Regional   Post-op Pain Management:    Induction: Intravenous  PONV Risk Score and Plan: 2 and Treatment may vary due to age or medical condition and Propofol infusion  Airway Management Planned: Natural Airway and Simple Face Mask  Additional Equipment:   Intra-op Plan:   Post-operative Plan:   Informed Consent: I have reviewed the patients History and Physical, chart, labs and discussed the procedure including the risks, benefits and alternatives for the proposed anesthesia with the patient or authorized representative who has indicated his/her understanding and acceptance.     Dental advisory given  Plan Discussed with: CRNA and  Anesthesiologist  Anesthesia Plan Comments:        Anesthesia Quick Evaluation

## 2019-08-20 NOTE — Anesthesia Procedure Notes (Addendum)
Anesthesia Regional Block: Popliteal block   Pre-Anesthetic Checklist: ,, timeout performed, Correct Patient, Correct Site, Correct Laterality, Correct Procedure, Correct Position, site marked, Risks and benefits discussed,  Surgical consent,  Pre-op evaluation,  At surgeon's request and post-op pain management  Laterality: Right  Prep: chloraprep       Needles:  Injection technique: Single-shot  Needle Type: Echogenic Stimulator Needle     Needle Length: 9cm  Needle Gauge: 21   Needle insertion depth: 7 cm   Additional Needles:   Procedures:,,,, ultrasound used (permanent image in chart),,,,  Narrative:  Start time: 08/20/2019 2:15 PM End time: 08/20/2019 2:20 PM Injection made incrementally with aspirations every 5 mL.  Performed by: Personally  Anesthesiologist: Mal Amabile, MD  Additional Notes: Timeout performed. Patient sedated. Relevant anatomy ID'd using [place]. Incremental 2-25ml injection of LA with frequent aspiration. Patient tolerated procedure well.        Right Popliteal Block

## 2019-08-20 NOTE — Progress Notes (Signed)
Orthopedic Tech Progress Note Patient Details:  [name] 10/24/1960 [id number]  Ortho Devices Type of Ortho Device: Postop shoe/boot Ortho Device/Splint Location: RLE Ortho Device/Splint Interventions: Ordered, Adjustment   Post Interventions Patient Tolerated: Well Instructions Provided: Care of device, Poper ambulation with device, Adjustment of device   Gianina Olinde 08/20/2019, 9:02 PM

## 2019-08-20 NOTE — Anesthesia Postprocedure Evaluation (Signed)
Anesthesia Post Note  Patient: [name]  Procedure(s) Performed: IRRIGATION & DEBRIDEMENT RIGHT FOOT PARTIAL CALCANEAL EXCISION (Right )     Patient location during evaluation: PACU Anesthesia Type: Regional Level of consciousness: awake and alert Pain management: pain level controlled Vital Signs Assessment: post-procedure vital signs reviewed and stable Respiratory status: spontaneous breathing, nonlabored ventilation, respiratory function stable and patient connected to nasal cannula oxygen Cardiovascular status: stable and blood pressure returned to baseline Postop Assessment: no apparent nausea or vomiting Anesthetic complications: no   No complications documented.  Last Vitals:  Vitals:   08/20/19 1725 08/20/19 1735  BP: 115/72 (!) 115/64  Pulse: 74 74  Resp: (!) 11 17  Temp:  36.9 C  SpO2: 98% 96%    Last Pain:  Vitals:   08/20/19 1735  TempSrc:   PainSc: 0-No pain                 [name]

## 2019-08-20 NOTE — Op Note (Signed)
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 Partial calcaneal excision. Local tissue rearrangement for wound closure 5 x 9 cm. Application of 13 cm Prevena wound VAC.  SURGEON:  Nadara Mustard, MD  PHYSICIAN ASSISTANT:None ANESTHESIA:   General  PREOPERATIVE INDICATIONS:  [name] is a  59 y.o. male with a diagnosis of necrotic ulcer right heel who failed conservative measures and elected for surgical management.    The risks benefits and alternatives were discussed with the patient preoperatively including but not limited to the risks of infection, bleeding, nerve injury, cardiopulmonary complications, the need for revision surgery, among others, and the patient was willing to proceed.  OPERATIVE IMPLANTS: Praveena wound VAC 13 cm.  @ENCIMAGES @  OPERATIVE FINDINGS: Superficially patient head necrotic ulcer that was 10 mm in diameter this was extensively larger subq. and required extensive soft tissue resection. The necrotic tissue was sent for cultures. The wound margins were clear on both the bone and soft tissue after excisional debridement.  OPERATIVE PROCEDURE: Patient was brought the operating room after undergoing a regional block the right lower extremity was then prepped using DuraPrep draped into a sterile field a timeout was called. A longitudinal elliptical incision was made around the ulcerative tissue this area of excision of soft tissue was extended until the margins were clear. This left a wound that was 5 x 9 cm. A oscillating saw was then used to remove part of the calcaneus. This was resected in 1 block of tissue. The wound was irrigated with normal saline local tissue rearrangement was used to close the wound 5 x 9 cm with 2-0 nylon. A Prevena wound VAC was applied this had a good  suction fit patient was taken to the PACU in stable condition.   DISCHARGE PLANNING:  Antibiotic duration: Preoperative antibiotics  Weightbearing: Touchdown weightbearing on the right  Pain medication: Prescription for Percocet  Dressing care/ Wound VAC: Continue wound VAC for 1 week  Ambulatory devices: Patient has crutches  Discharge to: Home.  Follow-up: In the office 1 week post operative.

## 2019-08-21 ENCOUNTER — Encounter (HOSPITAL_COMMUNITY): Payer: Self-pay | Admitting: Orthopedic Surgery

## 2019-08-23 ENCOUNTER — Other Ambulatory Visit: Payer: Self-pay

## 2019-08-23 ENCOUNTER — Encounter: Payer: Self-pay | Admitting: Physician Assistant

## 2019-08-23 ENCOUNTER — Ambulatory Visit (INDEPENDENT_AMBULATORY_CARE_PROVIDER_SITE_OTHER): Payer: BC Managed Care – PPO | Admitting: Physician Assistant

## 2019-08-23 DIAGNOSIS — M86271 Subacute osteomyelitis, right ankle and foot: Secondary | ICD-10-CM

## 2019-08-23 NOTE — Progress Notes (Signed)
Office Visit Note   Patient: [name]           Date of Birth: 1960-10-07           MRN: [id number] Visit Date: 08/23/2019              Requested by: Lucky Cowboy, MD [address] [place],  [place] [address] PCP: Lucky Cowboy, MD  Chief Complaint  Patient presents with   Right Foot - Pain, Follow-up      HPI: This is a pleasant 59 year old gentleman who is 3 days status post I&D of the lfoot and partial calcaneal excision.  His wound VAC apparently came off when he was at home  Assessment & Plan: Visit Diagnoses: No diagnosis found.  Plan: His wife will do daily cleansing with mild soap and water not to soak the heel.  She will apply a clean dry dressing every day.  Follow-up in 1 week  Follow-Up Instructions: No follow-ups on file.   Ortho Exam  Patient is alert, oriented, no adenopathy, well-dressed, normal affect, normal respiratory effort. VAC was not on the patient when he arrived at the office.  He has sutures are in place no wound dehiscence there is quite a bit of maceration along the bottom of the heel and up into the Achilles.  Mild foul odor from the VAC.  No purulent drainage No cellulitis Imaging: No results found.    Labs: Lab Results  Component Value Date   HGBA1C 6.7 (H) 07/19/2019   HGBA1C 10.8 (H) 04/20/2019   HGBA1C 12.0 (H) 01/20/2019   REPTSTATUS PENDING 08/20/2019   GRAMSTAIN  08/20/2019    FEW WBC PRESENT,BOTH PMN AND MONONUCLEAR ABUNDANT GRAM NEGATIVE RODS ABUNDANT GRAM POSITIVE COCCI IN PAIRS RARE GRAM POSITIVE RODS Performed at [facility], [address], [place], [place] [address]    CULT PENDING 08/20/2019     Lab Results  Component Value Date   ALBUMIN 2.8 (L) 08/20/2019   ALBUMIN 4.1 06/11/2016   ALBUMIN 3.9 03/20/2016    Lab Results  Component Value Date   MG 2.0 07/19/2019   MG 2.1 01/20/2019   MG 2.4 10/21/2018   Lab Results  Component Value Date   VD25OH 109 (H) 07/19/2019    VD25OH 62 04/20/2019   VD25OH 51 01/20/2019    No results found for: PREALBUMIN CBC EXTENDED Latest Ref Rng & Units 07/19/2019 04/20/2019 01/20/2019  WBC 3.8 - 10.8 Thousand/uL 10.3 12.8(H) 11.8(H)  RBC 4.20 - 5.80 Million/uL 5.09 4.75 5.19  HGB 13.2 - 17.1 g/dL 54.6 56.8 17.4(H)  HCT 38 - 50 % 48.9 45.2 49.4  PLT 140 - 400 Thousand/uL 203 191 191  NEUTROABS 1,500 - 7,800 cells/uL 5,789 7,488 6,714  LYMPHSABS 850 - 3,900 cells/uL 3,368 4,096(H) 3,776     There is no height or weight on file to calculate BMI.  Orders:  No orders of the defined types were placed in this encounter.  No orders of the defined types were placed in this encounter.    Procedures: No procedures performed  Clinical Data: No additional findings.  ROS:  All other systems negative, except as noted in the HPI. Review of Systems  Objective: Vital Signs: There were no vitals taken for this visit.  Specialty Comments:  No specialty comments available.  PMFS History: Patient Active Problem List   Diagnosis Date Noted   Acute osteomyelitis of right calcaneus ([name])    Cutaneous abscess of right foot  Diabetic ulcer of right heel associated with type 2 diabetes mellitus ([name]) 07/16/2019   Poorly controlled diabetes mellitus ([name]) 07/16/2019   FHx: heart disease 10/08/2017   Type 2 diabetes mellitus with stage 2 chronic kidney disease, without long-term current use of insulin ([name]) 10/08/2017   Insomnia 07/08/2017   Smoker 08/17/2015   COPD (chronic obstructive pulmonary disease) ([name]) 08/17/2015   T2_NIDDM w/Peripheral Neuropathy 04/01/2014   CKD stage 2 due to type 2 diabetes mellitus ([name]) 08/31/2013   Medication management 05/19/2013   Morbid obesity (BMI 35) 05/19/2013   Essential hypertension    Hyperlipidemia associated with type 2 diabetes mellitus ([name])    Vitamin D deficiency    History of kidney stones    Testosterone deficiency    History of hepatitis C     Past Medical History:  Diagnosis Date   Diabetic neuropathy ([name])    Diverticulitis    History of hepatitis C    has been treated in the past   History of kidney stones    Hyperlipidemia    Hypertension    Other testicular hypofunction    Type II or unspecified type diabetes mellitus without mention of complication, not stated as uncontrolled    Vitamin D deficiency     Family History  Problem Relation Age of Onset   Hypertension Mother    Cancer Father        colon   Alzheimer's disease Father    Stroke Father     Past Surgical History:  Procedure Laterality Date   CHOLECYSTECTOMY  1987   COLONOSCOPY     I & D EXTREMITY Right 08/20/2019   Procedure: IRRIGATION & DEBRIDEMENT RIGHT FOOT PARTIAL CALCANEAL EXCISION;  Surgeon: Nadara Mustard, MD;  Location: [facility] OR;  Service: Orthopedics;  Laterality: Right;   ORIF TIBIA FRACTURE     x 3 right leg   Social History   Occupational History   Not on file  Tobacco Use   Smoking status: Current Every Day Smoker    Packs/day: 1.00    Types: Cigarettes   Smokeless tobacco: Never Used  Substance and Sexual Activity   Alcohol use: No   Drug use: Yes    Types: Marijuana   Sexual activity: Not on file

## 2019-08-24 NOTE — Discharge Summary (Signed)
Discharge Diagnoses:  Active Problems:   Acute osteomyelitis of right calcaneus ([name])   Cutaneous abscess of right foot   Surgeries: Procedure(s): IRRIGATION & DEBRIDEMENT RIGHT FOOT PARTIAL CALCANEAL EXCISION on 08/20/2019    Consultants:   Discharged Condition: Improved  Hospital Course: [name] is an 58 y.o. male who was admitted 08/20/2019 with a chief complaint of right foot osteomyelitis, with a final diagnosis of necrotic ulcer right heel.  Patient was brought to the operating room on 08/20/2019 and underwent Procedure(s): IRRIGATION & DEBRIDEMENT RIGHT FOOT PARTIAL CALCANEAL EXCISION.    Patient was given perioperative antibiotics:  Anti-infectives (From admission, onward)   Start     Dose/Rate Route Frequency Ordered Stop   08/21/19 0600  ceFAZolin (ANCEF) IVPB 2g/100 mL premix  Status:  Discontinued        2 g 200 mL/hr over 30 Minutes Intravenous On call to O.R. 08/20/19 1243 08/20/19 2302    .  Patient was given sequential compression devices, early ambulation, and aspirin for DVT prophylaxis.  Recent vital signs: No data found..  Recent laboratory studies: No results found.  Discharge Medications:   Allergies as of 08/20/2019      Reactions   Invokana [canagliflozin]    Extremity edema/caused pain   Codeine Camsylate [codeine] Rash   Morphine And Related Rash      Medication List    STOP taking these medications   silver sulfADIAZINE 1 % cream Commonly known as: SILVADENE     TAKE these medications   aspirin 81 MG tablet Take 81 mg by mouth daily.   Cholecalciferol 1.25 MG (50000 UT) Tabs Take 1 tablet by mouth every other day.   citalopram 40 MG tablet Commonly known as: CELEXA Take 1 tablet Daily for Mood & Anxiety   doxycycline 100 MG tablet Commonly known as: VIBRA-TABS Take 1 tablet (100 mg total) by mouth 2 (two) times daily.   gabapentin 800 MG tablet Commonly known as: NEURONTIN Take 1/2 to 1 tablet     2 to 3 x /day as needed  for Neuropathy Pains   glipiZIDE 5 MG tablet Commonly known as: GLUCOTROL Take 1 tablet 3 x /day with Meals for Diabetes   hydrochlorothiazide 25 MG tablet Commonly known as: HYDRODIURIL Take 1 tablet by mouth once daily   lisinopril 20 MG tablet Commonly known as: ZESTRIL Take 1 tablet Daily for BP & Diabetic Kidney Protection   meclizine 25 MG tablet Commonly known as: ANTIVERT 1/2-1 pill up to 3 times daily for motion sickness/dizziness   meloxicam 15 MG tablet Commonly known as: MOBIC Take one daily with food for 2 weeks, can take with tylenol, can not take with aleve, iburpofen, then as needed daily for pain   metFORMIN 500 MG 24 hr tablet Commonly known as: GLUCOPHAGE-XR Take 1/2 to 1 or  2 tablets 2 x /day with Meals for Diabetes for Diabetes   oxyCODONE-acetaminophen 5-325 MG tablet Commonly known as: Percocet Take 1 tablet by mouth every 4 (four) hours as needed.   Steglatro 15 MG Tabs tablet Generic drug: ertugliflozin L-PyroglutamicAc Take 15 mg by mouth daily before breakfast.   verapamil 240 MG CR tablet Commonly known as: CALAN-SR Take 1 tablet Daily with Food for BP & Heart  Rhythm            Durable Medical Equipment  (From admission, onward)         Start     Ordered   08/20/19 0000  For home  use only DME Crutches        08/20/19 1720           Discharge Care Instructions  (From admission, onward)         Start     Ordered   08/20/19 0000  Touch down weight bearing       Question Answer Comment  Laterality right   Extremity Lower      08/20/19 1720          Diagnostic Studies: MR Ankle Right w/o contrast  Result Date: 08/10/2019 CLINICAL DATA:  Right heel ulceration. Clinical concern for osteomyelitis EXAM: MRI OF THE RIGHT ANKLE WITHOUT CONTRAST TECHNIQUE: Multiplanar, multisequence MR imaging of the ankle was performed. No intravenous contrast was administered. COMPARISON:  X-ray 07/16/2019 FINDINGS: Superficial soft tissue  ulceration at the posteromedial aspect of the hindfoot (series 3, images 37-39) with skin thickening and mild adjacent soft tissue edema. No underlying fluid collection. Ulceration does not extend to the underlying calcaneal cortex. TENDONS Peroneal: Intact peroneus longus and peroneus brevis tendons. Posteromedial: Intact tibialis posterior, flexor hallucis longus and flexor digitorum longus tendons. Anterior: Intact tibialis anterior, extensor hallucis longus and extensor digitorum longus tendons. Achilles: Intact. Plantar Fascia: Thickening and intermediate signal within the central and medial bands of the plantar fascia without tear or perifascial edema. LIGAMENTS Lateral: Evaluation of the lateral ankle ligaments is slightly degraded by susceptibility artifact and poor fat saturation related to patient's surgical hardware. The anterior and posterior tibiofibular ligaments are grossly intact. The anterior and posterior talofibular ligaments grossly are intact. Intact calcaneofibular ligament. Medial: Deltoid and spring ligament complex appear grossly intact. CARTILAGE Ankle Joint: Mild cartilage thinning with reactive subchondral marrow signal changes at the lateral talar shoulder. No tibiotalar joint effusion. Subtalar Joints/Sinus Tarsi: No joint effusion or chondral defect. Preservation of the anatomic fat within the sinus tarsi. Bones: Susceptibility artifact within the distal tibia related to prior ORIF hardware. Mild arthropathy and dorsal hypertrophy within the midfoot. Small plantar calcaneal spur and small enthesophyte at the Achilles tendon insertion. No acute fracture. No marrow edema. No cortical destruction. Other: Mild intramuscular edema within the intrinsic foot musculature. No soft tissue fluid collection. IMPRESSION: 1. Superficial soft tissue ulceration at the posteromedial aspect of the right hindfoot. No underlying fluid collection or evidence of osteomyelitis. 2. Findings suggest chronic  plantar fasciitis. 3. Mild intramuscular edema within the intrinsic foot musculature, which may reflect denervation changes versus myositis. 4. Mild arthropathy of the tibiotalar joint and midfoot. Electronically Signed   By: Duanne Guess D.O.   On: 08/10/2019 10:26    Patient benefited maximally from their hospital stay and there were no complications.     Disposition: Discharge disposition: 01-Home or Self Care      Discharge Instructions    Call MD / Call 911   Complete by: As directed    If you experience chest pain or shortness of breath, CALL 911 and be transported to the hospital emergency room.  If you develope a fever above 101 F, pus (white drainage) or increased drainage or redness at the wound, or calf pain, call your surgeon's office.   Constipation Prevention   Complete by: As directed    Drink plenty of fluids.  Prune juice may be helpful.  You may use a stool softener, such as Colace (over the counter) 100 mg twice a day.  Use MiraLax (over the counter) for constipation as needed.   Diet - low sodium heart healthy  Complete by: As directed    Discharge instructions   Complete by: As directed    Keep dressing dry and in place. Elevate right leg. Touchdown weightbearing   For home use only DME Crutches   Complete by: As directed    Increase activity slowly as tolerated   Complete by: As directed    Negative Pressure Wound Therapy - Incisional   Complete by: As directed    Show patient how to attach prevena vac.   Post-op shoe   Complete by: As directed    Touch down weight bearing   Complete by: As directed    Laterality: right   Extremity: Lower      Follow-up Information    Adonis Huguenin, NP In 1 week.   Specialty: Orthopedic Surgery Contact information: [address] [place] [address] [phone]                Signed: [place] Keyshia Orwick 08/24/2019, 9:22 AM

## 2019-08-24 NOTE — Addendum Note (Signed)
Addendum  created 08/24/19 0949 by Mal Amabile, MD   Clinical Note Signed, Intraprocedure Blocks edited

## 2019-08-25 ENCOUNTER — Telehealth: Payer: Self-pay | Admitting: Physician Assistant

## 2019-08-25 ENCOUNTER — Other Ambulatory Visit: Payer: Self-pay | Admitting: Physician Assistant

## 2019-08-25 ENCOUNTER — Encounter: Payer: Self-pay | Admitting: Family

## 2019-08-25 MED ORDER — CEFDINIR 300 MG PO CAPS: 300.0000 mg | ORAL_CAPSULE | Freq: Two times a day (BID) | ORAL | 0 refills | Status: DC
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
## 2019-08-25 NOTE — Telephone Encounter (Signed)
Spoke with the patients wife. Cultures have come back and after reviewing options with pharmacist it was felt that cefdinir 300 bid would be the most appropriate. She will get antibiotic today but said his leg is getting warmer and red. I  Asked that she bring him by today for evaluation and she declined saying she should be able to bring him over tomorrow but not today. She understands that if he continues to have issues she needs to have him transported to the emergency room

## 2019-08-26 ENCOUNTER — Other Ambulatory Visit: Payer: Self-pay

## 2019-08-26 ENCOUNTER — Other Ambulatory Visit: Payer: Self-pay | Admitting: *Deleted

## 2019-08-26 ENCOUNTER — Inpatient Hospital Stay (HOSPITAL_COMMUNITY)
Admission: EM | Admit: 2019-08-26 | Discharge: 2019-09-03 | DRG: 854 | Disposition: A | Discharge status: Rehabilitation Facility | Payer: BC Managed Care – PPO | Attending: Family Medicine | Admitting: Family Medicine

## 2019-08-26 ENCOUNTER — Inpatient Hospital Stay (HOSPITAL_COMMUNITY): Payer: BC Managed Care – PPO

## 2019-08-26 ENCOUNTER — Emergency Department (HOSPITAL_COMMUNITY): Payer: BC Managed Care – PPO

## 2019-08-26 ENCOUNTER — Inpatient Hospital Stay (HOSPITAL_COMMUNITY)
Admit: 2019-08-26 | Discharge: 2019-08-26 | Disposition: A | Discharge status: Home / Self Care | Payer: BC Managed Care – PPO | Attending: Family Medicine | Admitting: Family Medicine

## 2019-08-26 ENCOUNTER — Encounter (HOSPITAL_COMMUNITY): Payer: Self-pay | Admitting: *Deleted

## 2019-08-26 ENCOUNTER — Ambulatory Visit: Payer: BC Managed Care – PPO | Admitting: Physician Assistant

## 2019-08-26 DIAGNOSIS — Z1211 Encounter for screening for malignant neoplasm of colon: Secondary | ICD-10-CM | POA: Diagnosis not present

## 2019-08-26 DIAGNOSIS — E1342 Other specified diabetes mellitus with diabetic polyneuropathy: Secondary | ICD-10-CM | POA: Diagnosis not present

## 2019-08-26 DIAGNOSIS — K219 Gastro-esophageal reflux disease without esophagitis: Secondary | ICD-10-CM | POA: Diagnosis not present

## 2019-08-26 DIAGNOSIS — M86171 Other acute osteomyelitis, right ankle and foot: Secondary | ICD-10-CM | POA: Diagnosis not present

## 2019-08-26 DIAGNOSIS — Z89421 Acquired absence of other right toe(s): Secondary | ICD-10-CM | POA: Diagnosis not present

## 2019-08-26 DIAGNOSIS — E1152 Type 2 diabetes mellitus with diabetic peripheral angiopathy with gangrene: Secondary | ICD-10-CM | POA: Diagnosis present

## 2019-08-26 DIAGNOSIS — Z9049 Acquired absence of other specified parts of digestive tract: Secondary | ICD-10-CM

## 2019-08-26 DIAGNOSIS — Z4781 Encounter for orthopedic aftercare following surgical amputation: Secondary | ICD-10-CM | POA: Diagnosis not present

## 2019-08-26 DIAGNOSIS — F17210 Nicotine dependence, cigarettes, uncomplicated: Secondary | ICD-10-CM | POA: Diagnosis not present

## 2019-08-26 DIAGNOSIS — E1149 Type 2 diabetes mellitus with other diabetic neurological complication: Secondary | ICD-10-CM | POA: Diagnosis present

## 2019-08-26 DIAGNOSIS — A419 Sepsis, unspecified organism: Secondary | ICD-10-CM

## 2019-08-26 DIAGNOSIS — L03115 Cellulitis of right lower limb: Secondary | ICD-10-CM | POA: Diagnosis not present

## 2019-08-26 DIAGNOSIS — R5381 Other malaise: Secondary | ICD-10-CM | POA: Diagnosis not present

## 2019-08-26 DIAGNOSIS — Z89511 Acquired absence of right leg below knee: Secondary | ICD-10-CM | POA: Diagnosis not present

## 2019-08-26 DIAGNOSIS — Z0389 Encounter for observation for other suspected diseases and conditions ruled out: Secondary | ICD-10-CM | POA: Diagnosis not present

## 2019-08-26 DIAGNOSIS — E1165 Type 2 diabetes mellitus with hyperglycemia: Secondary | ICD-10-CM | POA: Diagnosis present

## 2019-08-26 DIAGNOSIS — R6520 Severe sepsis without septic shock: Secondary | ICD-10-CM | POA: Diagnosis not present

## 2019-08-26 DIAGNOSIS — Z791 Long term (current) use of non-steroidal anti-inflammatories (NSAID): Secondary | ICD-10-CM | POA: Diagnosis not present

## 2019-08-26 DIAGNOSIS — I70201 Unspecified atherosclerosis of native arteries of extremities, right leg: Secondary | ICD-10-CM | POA: Diagnosis not present

## 2019-08-26 DIAGNOSIS — E1151 Type 2 diabetes mellitus with diabetic peripheral angiopathy without gangrene: Secondary | ICD-10-CM | POA: Diagnosis not present

## 2019-08-26 DIAGNOSIS — E871 Hypo-osmolality and hyponatremia: Secondary | ICD-10-CM | POA: Diagnosis present

## 2019-08-26 DIAGNOSIS — Z7982 Long term (current) use of aspirin: Secondary | ICD-10-CM | POA: Diagnosis not present

## 2019-08-26 DIAGNOSIS — Z969 Presence of functional implant, unspecified: Secondary | ICD-10-CM

## 2019-08-26 DIAGNOSIS — L97419 Non-pressure chronic ulcer of right heel and midfoot with unspecified severity: Secondary | ICD-10-CM | POA: Diagnosis present

## 2019-08-26 DIAGNOSIS — E11621 Type 2 diabetes mellitus with foot ulcer: Secondary | ICD-10-CM | POA: Diagnosis present

## 2019-08-26 DIAGNOSIS — E1140 Type 2 diabetes mellitus with diabetic neuropathy, unspecified: Secondary | ICD-10-CM | POA: Diagnosis present

## 2019-08-26 DIAGNOSIS — I10 Essential (primary) hypertension: Secondary | ICD-10-CM | POA: Diagnosis present

## 2019-08-26 DIAGNOSIS — R52 Pain, unspecified: Secondary | ICD-10-CM

## 2019-08-26 DIAGNOSIS — Z7984 Long term (current) use of oral hypoglycemic drugs: Secondary | ICD-10-CM

## 2019-08-26 DIAGNOSIS — E669 Obesity, unspecified: Secondary | ICD-10-CM | POA: Diagnosis not present

## 2019-08-26 DIAGNOSIS — M7989 Other specified soft tissue disorders: Secondary | ICD-10-CM | POA: Diagnosis not present

## 2019-08-26 DIAGNOSIS — R600 Localized edema: Secondary | ICD-10-CM | POA: Diagnosis not present

## 2019-08-26 DIAGNOSIS — D62 Acute posthemorrhagic anemia: Secondary | ICD-10-CM | POA: Diagnosis not present

## 2019-08-26 DIAGNOSIS — G8918 Other acute postprocedural pain: Secondary | ICD-10-CM | POA: Diagnosis not present

## 2019-08-26 DIAGNOSIS — M19071 Primary osteoarthritis, right ankle and foot: Secondary | ICD-10-CM | POA: Diagnosis not present

## 2019-08-26 DIAGNOSIS — E785 Hyperlipidemia, unspecified: Secondary | ICD-10-CM | POA: Diagnosis present

## 2019-08-26 DIAGNOSIS — R0689 Other abnormalities of breathing: Secondary | ICD-10-CM | POA: Diagnosis not present

## 2019-08-26 DIAGNOSIS — I739 Peripheral vascular disease, unspecified: Secondary | ICD-10-CM | POA: Diagnosis not present

## 2019-08-26 DIAGNOSIS — E1142 Type 2 diabetes mellitus with diabetic polyneuropathy: Secondary | ICD-10-CM | POA: Diagnosis present

## 2019-08-26 DIAGNOSIS — Z89431 Acquired absence of right foot: Secondary | ICD-10-CM | POA: Diagnosis not present

## 2019-08-26 DIAGNOSIS — E1169 Type 2 diabetes mellitus with other specified complication: Secondary | ICD-10-CM | POA: Diagnosis not present

## 2019-08-26 DIAGNOSIS — Z20822 Contact with and (suspected) exposure to covid-19: Secondary | ICD-10-CM | POA: Diagnosis present

## 2019-08-26 DIAGNOSIS — Z79899 Other long term (current) drug therapy: Secondary | ICD-10-CM | POA: Diagnosis not present

## 2019-08-26 DIAGNOSIS — B1920 Unspecified viral hepatitis C without hepatic coma: Secondary | ICD-10-CM | POA: Diagnosis not present

## 2019-08-26 DIAGNOSIS — Z1212 Encounter for screening for malignant neoplasm of rectum: Secondary | ICD-10-CM | POA: Diagnosis not present

## 2019-08-26 DIAGNOSIS — Z6833 Body mass index (BMI) 33.0-33.9, adult: Secondary | ICD-10-CM | POA: Diagnosis not present

## 2019-08-26 DIAGNOSIS — J449 Chronic obstructive pulmonary disease, unspecified: Secondary | ICD-10-CM | POA: Diagnosis present

## 2019-08-26 DIAGNOSIS — L02415 Cutaneous abscess of right lower limb: Secondary | ICD-10-CM | POA: Diagnosis not present

## 2019-08-26 DIAGNOSIS — M869 Osteomyelitis, unspecified: Secondary | ICD-10-CM | POA: Diagnosis not present

## 2019-08-26 DIAGNOSIS — L97519 Non-pressure chronic ulcer of other part of right foot with unspecified severity: Secondary | ICD-10-CM | POA: Diagnosis not present

## 2019-08-26 DIAGNOSIS — M86179 Other acute osteomyelitis, unspecified ankle and foot: Secondary | ICD-10-CM | POA: Diagnosis present

## 2019-08-26 HISTORY — DX: Sepsis, unspecified organism: R65.20

## 2019-08-26 HISTORY — DX: Sepsis, unspecified organism: A41.9

## 2019-08-26 LAB — COMPREHENSIVE METABOLIC PANEL
ALT: 67 U/L — ABNORMAL HIGH (ref 0–44)
AST: 79 U/L — ABNORMAL HIGH (ref 15–41)
Albumin: 2 g/dL — ABNORMAL LOW (ref 3.5–5.0)
Alkaline Phosphatase: 90 U/L (ref 38–126)
Anion gap: 12 (ref 5–15)
BUN: 15 mg/dL (ref 6–20)
CO2: 19 mmol/L — ABNORMAL LOW (ref 22–32)
Calcium: 8.8 mg/dL — ABNORMAL LOW (ref 8.9–10.3)
Chloride: 95 mmol/L — ABNORMAL LOW (ref 98–111)
Creatinine, Ser: 0.96 mg/dL (ref 0.61–1.24)
GFR calc Af Amer: >60 mL/min (ref >60)
GFR calc non Af Amer: >60 mL/min (ref >60)
Glucose, Bld: 132 mg/dL — ABNORMAL HIGH (ref 70–99)
Potassium: 4 mmol/L (ref 3.5–5.1)
Sodium: 126 mmol/L — ABNORMAL LOW (ref 135–145)
Total Bilirubin: 1.1 mg/dL (ref 0.3–1.2)
Total Protein: 6.9 g/dL (ref 6.5–8.1)

## 2019-08-26 LAB — CBC WITH DIFFERENTIAL/PLATELET
Abs Immature Granulocytes: 0 10*3/uL (ref 0.00–0.07)
Basophils Absolute: 0 10*3/uL (ref 0.0–0.1)
Basophils Relative: 0 %
Eosinophils Absolute: 0 10*3/uL (ref 0.0–0.5)
Eosinophils Relative: 0 %
HCT: 42.2 % (ref 39.0–52.0)
Hemoglobin: 14.3 g/dL (ref 13.0–17.0)
Lymphocytes Relative: 6 %
Lymphs Abs: 1.6 10*3/uL (ref 0.7–4.0)
MCH: 31.2 pg (ref 26.0–34.0)
MCHC: 33.9 g/dL (ref 30.0–36.0)
MCV: 91.9 fL (ref 80.0–100.0)
Monocytes Absolute: 1.1 10*3/uL — ABNORMAL HIGH (ref 0.1–1.0)
Monocytes Relative: 4 %
Neutro Abs: 23.9 10*3/uL — ABNORMAL HIGH (ref 1.7–7.7)
Neutrophils Relative %: 90 %
Platelets: 267 10*3/uL (ref 150–400)
RBC: 4.59 MIL/uL (ref 4.22–5.81)
RDW: 13.3 % (ref 11.5–15.5)
WBC: 26.6 10*3/uL — ABNORMAL HIGH (ref 4.0–10.5)
nRBC: 0 % (ref 0.0–0.2)
nRBC: 0 /100 WBC

## 2019-08-26 LAB — SARS CORONAVIRUS 2 BY RT PCR (HOSPITAL ORDER, PERFORMED IN ~~LOC~~ HOSPITAL LAB): SARS Coronavirus 2: NEGATIVE

## 2019-08-26 LAB — APTT: aPTT: 36 seconds (ref 24–36)

## 2019-08-26 LAB — AEROBIC/ANAEROBIC CULTURE W GRAM STAIN (SURGICAL/DEEP WOUND)

## 2019-08-26 LAB — PROTIME-INR
INR: 1.3 — ABNORMAL HIGH (ref 0.8–1.2)
Prothrombin Time: 16.1 seconds — ABNORMAL HIGH (ref 11.4–15.2)

## 2019-08-26 LAB — MAGNESIUM: Magnesium: 1.8 mg/dL (ref 1.7–2.4)

## 2019-08-26 LAB — LACTIC ACID, PLASMA
Lactic Acid, Venous: 2.3 mmol/L (ref 0.5–1.9)
Lactic Acid, Venous: 2.8 mmol/L (ref 0.5–1.9)
Lactic Acid, Venous: 3.3 mmol/L (ref 0.5–1.9)
Lactic Acid, Venous: 1.7 mmol/L (ref 0.5–1.9)

## 2019-08-26 LAB — HEMOGLOBIN A1C
Hgb A1c MFr Bld: 6.6 % — ABNORMAL HIGH (ref 4.8–5.6)
Mean Plasma Glucose: 142.72 mg/dL

## 2019-08-26 LAB — HIV ANTIBODY (ROUTINE TESTING W REFLEX): HIV Screen 4th Generation wRfx: NONREACTIVE

## 2019-08-26 LAB — POC HEMOCCULT BLD/STL (HOME/3-CARD/SCREEN)
Card #2 Fecal Occult Blod, POC: NEGATIVE
Card #3 Fecal Occult Blood, POC: NEGATIVE
Fecal Occult Blood, POC: NEGATIVE

## 2019-08-26 LAB — GLUCOSE, CAPILLARY
Glucose-Capillary: 153 mg/dL — ABNORMAL HIGH (ref 70–99)
Glucose-Capillary: 100 mg/dL — ABNORMAL HIGH (ref 70–99)

## 2019-08-26 MED ORDER — LACTATED RINGERS IV BOLUS (SEPSIS)
1000.0000 mL | Freq: Once | INTRAVENOUS | Status: AC
  Administered 2019-08-26: 1000 mL via INTRAVENOUS

## 2019-08-26 MED ORDER — ENOXAPARIN SODIUM 40 MG/0.4ML ~~LOC~~ SOLN
40.0000 mg | SUBCUTANEOUS | Status: DC
  Administered 2019-08-26: 40 mg via SUBCUTANEOUS
  Filled 2019-08-26: qty 0.4

## 2019-08-26 MED ORDER — ACETAMINOPHEN 325 MG PO TABS: 650.0000 mg | ORAL_TABLET | Freq: Four times a day (QID) | ORAL | Status: DC | PRN

## 2019-08-26 MED ORDER — ACETAMINOPHEN 650 MG RE SUPP: 650.0000 mg | Freq: Four times a day (QID) | RECTAL | Status: DC | PRN

## 2019-08-26 MED ORDER — GADOBUTROL 1 MMOL/ML IV SOLN
10.0000 mL | Freq: Once | INTRAVENOUS | Status: AC | PRN
  Administered 2019-08-26: 10 mL via INTRAVENOUS

## 2019-08-26 MED ORDER — PIPERACILLIN-TAZOBACTAM 3.375 G IVPB
3.3750 g | Freq: Three times a day (TID) | INTRAVENOUS | Status: DC
  Administered 2019-08-26 – 2019-08-28 (×6): 3.375 g via INTRAVENOUS
  Filled 2019-08-26 (×6): qty 50

## 2019-08-26 MED ORDER — ONDANSETRON HCL 4 MG/2ML IJ SOLN: 4.0000 mg | Freq: Four times a day (QID) | INTRAMUSCULAR | Status: DC | PRN

## 2019-08-26 MED ORDER — VANCOMYCIN HCL 750 MG/150ML IV SOLN
750.0000 mg | Freq: Two times a day (BID) | INTRAVENOUS | Status: DC
  Administered 2019-08-27 – 2019-08-28 (×4): 750 mg via INTRAVENOUS
  Filled 2019-08-26 (×5): qty 150

## 2019-08-26 MED ORDER — VANCOMYCIN HCL IN DEXTROSE 1-5 GM/200ML-% IV SOLN: 1000.0000 mg | Freq: Once | INTRAVENOUS | Status: DC

## 2019-08-26 MED ORDER — SODIUM CHLORIDE 0.9 % IV SOLN
2.0000 g | Freq: Once | INTRAVENOUS | Status: AC
  Administered 2019-08-26: 2 g via INTRAVENOUS
  Filled 2019-08-26: qty 20

## 2019-08-26 MED ORDER — INSULIN ASPART 100 UNIT/ML ~~LOC~~ SOLN
0.0000 [IU] | Freq: Three times a day (TID) | SUBCUTANEOUS | Status: DC
  Administered 2019-08-26 – 2019-08-28 (×2): 3 [IU] via SUBCUTANEOUS
  Administered 2019-08-28 (×2): 2 [IU] via SUBCUTANEOUS
  Administered 2019-08-29: 3 [IU] via SUBCUTANEOUS
  Administered 2019-08-30 – 2019-08-31 (×5): 2 [IU] via SUBCUTANEOUS
  Administered 2019-09-02: 5 [IU] via SUBCUTANEOUS
  Administered 2019-09-02 – 2019-09-03 (×3): 2 [IU] via SUBCUTANEOUS
  Administered 2019-09-03 (×2): 3 [IU] via SUBCUTANEOUS

## 2019-08-26 MED ORDER — SODIUM CHLORIDE 0.9 % IV SOLN: INTRAVENOUS | Status: DC

## 2019-08-26 MED ORDER — OXYCODONE-ACETAMINOPHEN 5-325 MG PO TABS
1.0000 | ORAL_TABLET | ORAL | Status: DC | PRN
  Administered 2019-08-26: 1 via ORAL
  Filled 2019-08-26: qty 1

## 2019-08-26 MED ORDER — LACTATED RINGERS IV SOLN: INTRAVENOUS | Status: DC

## 2019-08-26 MED ORDER — VANCOMYCIN HCL 2000 MG/400ML IV SOLN
2000.0000 mg | Freq: Once | INTRAVENOUS | Status: AC
  Administered 2019-08-26: 2000 mg via INTRAVENOUS
  Filled 2019-08-26: qty 400

## 2019-08-26 MED ORDER — VERAPAMIL HCL ER 240 MG PO TBCR
240.0000 mg | EXTENDED_RELEASE_TABLET | Freq: Every day | ORAL | Status: DC
  Administered 2019-08-26 – 2019-09-03 (×7): 240 mg via ORAL
  Filled 2019-08-26 (×10): qty 1

## 2019-08-26 MED ORDER — CITALOPRAM HYDROBROMIDE 20 MG PO TABS
20.0000 mg | ORAL_TABLET | Freq: Every day | ORAL | Status: DC
  Administered 2019-08-26 – 2019-09-03 (×7): 20 mg via ORAL
  Filled 2019-08-26 (×3): qty 1
  Filled 2019-08-26: qty 2
  Filled 2019-08-26 (×2): qty 1
  Filled 2019-08-26: qty 2
  Filled 2019-08-26: qty 1

## 2019-08-26 MED ORDER — ASPIRIN EC 81 MG PO TBEC
81.0000 mg | DELAYED_RELEASE_TABLET | Freq: Every day | ORAL | Status: DC
  Administered 2019-08-26 – 2019-09-03 (×7): 81 mg via ORAL
  Filled 2019-08-26 (×7): qty 1

## 2019-08-26 MED ORDER — VANCOMYCIN HCL IN DEXTROSE 1-5 GM/200ML-% IV SOLN
1000.0000 mg | Freq: Once | INTRAVENOUS | Status: DC
Start: 2019-08-26 — End: 2019-08-26

## 2019-08-26 MED ORDER — INSULIN ASPART 100 UNIT/ML ~~LOC~~ SOLN: 0.0000 [IU] | Freq: Every day | SUBCUTANEOUS | Status: DC

## 2019-08-26 MED ORDER — SODIUM CHLORIDE 0.9% FLUSH
3.0000 mL | Freq: Two times a day (BID) | INTRAVENOUS | Status: DC
  Administered 2019-08-26 – 2019-09-03 (×8): 3 mL via INTRAVENOUS

## 2019-08-26 MED ORDER — ONDANSETRON HCL 4 MG PO TABS: 4.0000 mg | ORAL_TABLET | Freq: Four times a day (QID) | ORAL | Status: DC | PRN

## 2019-08-26 MED ORDER — PIPERACILLIN-TAZOBACTAM 3.375 G IVPB 30 MIN
3.3750 g | Freq: Once | INTRAVENOUS | Status: AC
  Administered 2019-08-26: 3.375 g via INTRAVENOUS
  Filled 2019-08-26 (×2): qty 50

## 2019-08-26 MED ORDER — SODIUM CHLORIDE 0.9% FLUSH: 3.0000 mL | Freq: Once | INTRAVENOUS | Status: DC

## 2019-08-26 NOTE — ED Provider Notes (Signed)
[facility] EMERGENCY DEPARTMENT Provider Note   CSN: [id number] Arrival date & time: 08/26/19  5188     History Chief Complaint  Patient presents with  . Wound Check  . Wound Infection    [name] is a 59 y.o. male.  The history is provided by the patient, the spouse and medical records. No language interpreter was used.     59 year old male with significant history of diabetes with diabetic neuropathy, hepatitis C, hyperlipidemia, hypertension, recently hospitalized for osteomyelitis and abscess of the right foot requiring irrigation and debridement of the right foot with partial calcaneal excision on 08/20/2019 presenting today with complaint of worsening wound infection.  Patient reports since release from the hospital he has developed increasing worsening pain about his right foot.  Pain is sharp throbbing 6 out of 10, with progressive worsening redness swelling and strong foul odor.  He also endorsed subjective fever, decrease in appetite and generalized weakness.  He endorsed having occasional loose stools.  He mention starting on a new antibiotic last night but due to worsening of his symptoms he decided to come here for further evaluation.  Wife has noticed that patient is more forgetful and weaker than usual.  They felt IV antibiotics would be helpful for him.  He has not had Covid vaccination.  He denies any new numbness or back pain.  Denies severe headache.  Past Medical History:  Diagnosis Date  . Diabetic neuropathy ([name])   . Diverticulitis   . History of hepatitis C    has been treated in the past  . History of kidney stones   . Hyperlipidemia   . Hypertension   . Other testicular hypofunction   . Type II or unspecified type diabetes mellitus without mention of complication, not stated as uncontrolled   . Vitamin D deficiency     Patient Active Problem List   Diagnosis Date Noted  . Acute osteomyelitis of right calcaneus ([name])   . Cutaneous  abscess of right foot   . Diabetic ulcer of right heel associated with type 2 diabetes mellitus ([name]) 07/16/2019  . Poorly controlled diabetes mellitus ([name]) 07/16/2019  . FHx: heart disease 10/08/2017  . Type 2 diabetes mellitus with stage 2 chronic kidney disease, without long-term current use of insulin ([name]) 10/08/2017  . Insomnia 07/08/2017  . Smoker 08/17/2015  . COPD (chronic obstructive pulmonary disease) ([name]) 08/17/2015  . 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 Medication management 05/19/2013  . Morbid obesity (BMI 35) 05/19/2013  . Essential hypertension   . Hyperlipidemia associated with type 2 diabetes mellitus ([name])   . Vitamin D deficiency   . History of kidney stones   . Testosterone deficiency   . History of hepatitis C     Past Surgical History:  Procedure Laterality Date  . CHOLECYSTECTOMY  1987  . COLONOSCOPY    . I & D EXTREMITY Right 08/20/2019   Procedure: IRRIGATION & DEBRIDEMENT RIGHT FOOT PARTIAL CALCANEAL EXCISION;  Surgeon: Nadara Mustard, MD;  Location: [facility] OR;  Service: Orthopedics;  Laterality: Right;  . ORIF TIBIA FRACTURE     x 3 right leg       Family History  Problem Relation Age of Onset  . Hypertension Mother   . Cancer Father        colon  . Alzheimer's disease Father   . Stroke Father     Social History  Tobacco Use  . Smoking status: Current Every Day Smoker    Packs/day: 1.00    Types: Cigarettes  . Smokeless tobacco: Never Used  Substance Use Topics  . Alcohol use: No  . Drug use: Yes    Types: Marijuana    Home Medications Prior to Admission medications   Medication Sig Start Date End Date Taking? Authorizing Provider  aspirin 81 MG tablet Take 81 mg by mouth daily.    [provider]  cefdinir (OMNICEF) 300 MG capsule Take 1 capsule (300 mg total) by mouth 2 (two) times daily. 08/25/19   Persons, [place], [place]  Cholecalciferol 50000  units TABS Take 1 tablet by mouth every other day.     [provider]  citalopram (CELEXA) 40 MG tablet Take 1 tablet Daily for Mood & Anxiety 05/27/19   Lucky CowboyMcKeown, William, MD  doxycycline (VIBRA-TABS) 100 MG tablet Take 1 tablet (100 mg total) by mouth 2 (two) times daily. 07/16/19   Persons, [place], PA  gabapentin (NEURONTIN) 800 MG tablet Take 1/2 to 1 tablet     2 to 3 x /day as needed for Neuropathy Pains 07/08/19   Lucky CowboyMcKeown, William, MD  glipiZIDE (GLUCOTROL) 5 MG tablet Take 1 tablet 3 x /day with Meals for Diabetes 07/08/19   Lucky CowboyMcKeown, William, MD  hydrochlorothiazide (HYDRODIURIL) 25 MG tablet Take 1 tablet by mouth once daily 02/09/19   Elder NegusMcClanahan, Kyra, NP  lisinopril (ZESTRIL) 20 MG tablet Take 1 tablet Daily for BP & Diabetic Kidney Protection 05/27/19   Lucky CowboyMcKeown, William, MD  meclizine (ANTIVERT) 25 MG tablet 1/2-1 pill up to 3 times daily for motion sickness/dizziness 07/21/19   Lucky CowboyMcKeown, William, MD  meloxicam (MOBIC) 15 MG tablet Take one daily with food for 2 weeks, can take with tylenol, can not take with aleve, iburpofen, then as needed daily for pain 11/23/18   Judd Gaudierorbett, Ashley, NP  metFORMIN (GLUCOPHAGE-XR) 500 MG 24 hr tablet Take 1/2 to 1 or  2 tablets 2 x /day with Meals for Diabetes for Diabetes 01/20/19   Lucky CowboyMcKeown, William, MD  oxyCODONE-acetaminophen (PERCOCET) 5-325 MG tablet Take 1 tablet by mouth every 4 (four) hours as needed. 08/20/19 08/19/20  Persons, [place], PA  STEGLATRO 15 MG TABS Take 15 mg by mouth daily before breakfast. 05/24/19   Elder NegusMcClanahan, Kyra, NP  verapamil (CALAN-SR) 240 MG CR tablet Take 1 tablet Daily with Food for BP & Heart  Rhythm 07/19/19   Lucky CowboyMcKeown, William, MD    Allergies    Invokana [canagliflozin], Codeine camsylate [codeine], and Morphine and related  Review of Systems   Review of Systems  All other systems reviewed and are negative.   Physical Exam Updated Vital Signs BP 140/78 (BP Location: Right Arm)   Pulse (!) 103   Temp 99.5 F  (37.5 C) (Oral)   Resp 17   Ht 6\' 3"  (1.905 m)   Wt 117.9 kg   SpO2 99%   BMI 32.50 kg/m   Physical Exam Vitals and nursing note reviewed.  Constitutional:      General: He is not in acute distress.    Appearance: He is well-developed. He is ill-appearing.     Comments: Ill-appearing male in no significant discomfort.  HENT:     Head: Atraumatic.     Mouth/Throat:     Mouth: Mucous membranes are dry.  Eyes:     Conjunctiva/sclera: Conjunctivae normal.  Cardiovascular:     Rate and Rhythm: Tachycardia present.     Pulses:  Normal pulses.     Heart sounds: Normal heart sounds.  Pulmonary:     Effort: Pulmonary effort is normal.     Breath sounds: Normal breath sounds.  Abdominal:     Palpations: Abdomen is soft.     Tenderness: There is no abdominal tenderness.  Musculoskeletal:     Cervical back: Neck supple.  Skin:    Capillary Refill: Capillary refill takes 2 to 3 seconds.     Findings: Erythema (Right lower extremity: Significant erythema edema and warmth noted throughout the right foot extending towards the calf with a large recently surgically open wound to his calcaneal region with suture in place.  Macerated skin and strong foul odor noted.) present.  Neurological:     Mental Status: He is alert and oriented to person, place, and time.  Psychiatric:        Mood and Affect: Mood normal.               ED Results / Procedures / Treatments   Labs (all labs ordered are listed, but only abnormal results are displayed) Labs Reviewed  LACTIC ACID, PLASMA - Abnormal; Notable for the following components:      Result Value   Lactic Acid, Venous 2.3 (*)    All other components within normal limits  LACTIC ACID, PLASMA - Abnormal; Notable for the following components:   Lactic Acid, Venous 2.8 (*)    All other components within normal limits  COMPREHENSIVE METABOLIC PANEL - Abnormal; Notable for the following components:   Sodium 126 (*)    Chloride 95 (*)     CO2 19 (*)    Glucose, Bld 132 (*)    Calcium 8.8 (*)    Albumin 2.0 (*)    AST 79 (*)    ALT 67 (*)    All other components within normal limits  CBC WITH DIFFERENTIAL/PLATELET - Abnormal; Notable for the following components:   WBC 26.6 (*)    Neutro Abs 23.9 (*)    Monocytes Absolute 1.1 (*)    All other components within normal limits  PROTIME-INR - Abnormal; Notable for the following components:   Prothrombin Time 16.1 (*)    INR 1.3 (*)    All other components within normal limits  CULTURE, BLOOD (ROUTINE X 2)  CULTURE, BLOOD (ROUTINE X 2)  URINE CULTURE  SARS CORONAVIRUS 2 BY RT PCR (HOSPITAL ORDER, PERFORMED IN [facility])  APTT  URINALYSIS, ROUTINE W REFLEX MICROSCOPIC  LACTIC ACID, PLASMA  HIV ANTIBODY (ROUTINE TESTING W REFLEX)  CBC  CREATININE, SERUM  MAGNESIUM  HEMOGLOBIN A1C    EKG EKG Interpretation  Date/Time:  Thursday August 26 2019 13:05:54 EDT Ventricular Rate:  94 PR Interval:    QRS Duration: 106 QT Interval:  364 QTC Calculation: 456 R Axis:   80 Text Interpretation: Sinus rhythm Inferior infarct, old When compared to prior, similar appearance no STEMI Confirmed by Theda Belfast ([id number]) on 08/26/2019 1:27:58 PM   Radiology DG Chest Port 1 View  Result Date: 08/26/2019 CLINICAL DATA:  Possible sepsis. EXAM: PORTABLE CHEST 1 VIEW COMPARISON:  October 13, 2016. FINDINGS: The heart size and mediastinal contours are within normal limits. Both lungs are clear. The visualized skeletal structures are unremarkable. IMPRESSION: No active disease. Electronically Signed   By: Lupita Raider M.D.   On: 08/26/2019 12:13    Procedures .Critical Care Performed by: Fayrene Helper, PA-C Authorized by: Fayrene Helper, PA-C   Critical care provider statement:  Critical care time (minutes):  45   Critical care was time spent personally by me on the following activities:  Discussions with consultants, evaluation of patient's response to  treatment, examination of patient, ordering and performing treatments and interventions, ordering and review of laboratory studies, ordering and review of radiographic studies, pulse oximetry, re-evaluation of patient's condition, obtaining history from patient or surrogate and review of old charts   (including critical care time)  Medications Ordered in ED Medications  sodium chloride flush (NS) 0.9 % injection 3 mL (has no administration in time range)  lactated ringers infusion (has no administration in time range)  lactated ringers bolus 1,000 mL (has no administration in time range)    And  lactated ringers bolus 1,000 mL (has no administration in time range)    And  lactated ringers bolus 1,000 mL (1,000 mLs Intravenous New Bag/Given 08/26/19 1252)    And  lactated ringers bolus 1,000 mL (has no administration in time range)  vancomycin (VANCOREADY) IVPB 2000 mg/400 mL (has no administration in time range)  vancomycin (VANCOREADY) IVPB 750 mg/150 mL (has no administration in time range)  aspirin tablet 81 mg (has no administration in time range)  oxyCODONE-acetaminophen (PERCOCET/ROXICET) 5-325 MG per tablet 1 tablet (has no administration in time range)  verapamil (CALAN-SR) CR tablet 240 mg (has no administration in time range)  citalopram (CELEXA) tablet 20 mg (has no administration in time range)  enoxaparin (LOVENOX) injection 40 mg (has no administration in time range)  sodium chloride flush (NS) 0.9 % injection 3 mL (has no administration in time range)  0.9 %  sodium chloride infusion (has no administration in time range)  vancomycin (VANCOCIN) IVPB 1000 mg/200 mL premix (has no administration in time range)  acetaminophen (TYLENOL) tablet 650 mg (has no administration in time range)    Or  acetaminophen (TYLENOL) suppository 650 mg (has no administration in time range)  ondansetron (ZOFRAN) tablet 4 mg (has no administration in time range)    Or  ondansetron (ZOFRAN)  injection 4 mg (has no administration in time range)  piperacillin-tazobactam (ZOSYN) IVPB 3.375 g (has no administration in time range)  vancomycin (VANCOCIN) IVPB 1000 mg/200 mL premix (has no administration in time range)  insulin aspart (novoLOG) injection 0-15 Units (has no administration in time range)  insulin aspart (novoLOG) injection 0-5 Units (has no administration in time range)  cefTRIAXone (ROCEPHIN) 2 g in sodium chloride 0.9 % 100 mL IVPB (2 g Intravenous New Bag/Given 08/26/19 1312)    ED Course  I have reviewed the triage vital signs and the nursing notes.  Pertinent labs & imaging results that were available during my care of the patient were reviewed by me and considered in my medical decision making (see chart for details).    MDM Rules/Calculators/A&P                          BP 140/78 (BP Location: Right Arm)   Pulse (!) 103   Temp 99.5 F (37.5 C) (Oral)   Resp 17   Ht 6\' 3"  (1.905 m)   Wt 117.9 kg   SpO2 99%   BMI 32.50 kg/m   Final Clinical Impression(s) / ED Diagnoses Final diagnoses:  Osteomyelitis of right foot, unspecified type ([name])  Diabetic ulcer of right heel associated with type 2 diabetes mellitus, with necrosis of bone ([name])  Sepsis, due to unspecified organism, unspecified whether acute organ dysfunction present ([name])  Rx / DC Orders ED Discharge Orders    None     11:42 AM Patient with right diabetic foot ulcer leading to osteomyelitis recently had surgical intervention last week presenting with worsening pain swelling redness and now having fever and chills.  Patient failed outpatient therapy and will need to be admitted for treatment of sepsis secondary to osteomyelitis of the right foot.  Labs remarkable for an elevated lactic acid of 2.3, elevated white count of 26.6, patient felt warm to the touch, or temps 99.5 and mildly tachycardic with a heart rate of 103.  He is not hypotensive.  Has a sodium of 126 and mildly elevated  transaminitis with AST 79, ALT 67.  He is able to answer questions appropriately.  He will receive IV fluid resuscitation as well as antibiotic including vancomycin and Rocephin.  Will consult for admission.  Care discussed with Dr. Rush Landmark.   1:05 PM Appreciate consultation from on call orthopedist DR. Xu who request medicine admission, IV abx, and MRI of R foot.    1:14 PM Appreciate consultation from [facility] Dr. Tempie Donning who agrees to see and admit pt for further care.   [name] was evaluated in Emergency Department on 08/26/2019 for the symptoms described in the history of present illness. He was evaluated in the context of the global COVID-19 pandemic, which necessitated consideration that the patient might be at risk for infection with the SARS-CoV-2 virus that causes COVID-19. Institutional protocols and algorithms that pertain to the evaluation of patients at risk for COVID-19 are in a state of rapid change based on information released by regulatory bodies including the CDC and federal and state organizations. These policies and algorithms were followed during the patient's care in the ED.  DR. Xu request photos of the wound   Fayrene Helper, PA-C 08/26/19 1342    Tegeler, Canary Brim, MD 08/26/19 [phone]

## 2019-08-26 NOTE — ED Triage Notes (Signed)
To ED via [facility] for eval of right heel wound check. Pt had I/D done by Dr Lajoyce Corners recently. Told not to restart wound vac. Wound became red and more painful. Called office and started on abx yesterday. One dose taken. Wound is draining, foul smelling, with redness, and red streaking up right leg.

## 2019-08-26 NOTE — Progress Notes (Signed)
Notified bedside nurse of need to administer fluid bolus and antibiotics.

## 2019-08-26 NOTE — Progress Notes (Signed)
Pharmacy Antibiotic Note  [name] is a 59 y.o. male admitted on 08/26/2019 with sepsis. Patient received one dose of ceftriaxone in the ED and was also placed on vancomycin for cellulitis. Pharmacy has been consulted for Zosyn dosing.  Plan: - Initiate Zosyn IV 3.375 g q8h (4 hour infusion) - Monitor renal function, clinical status, length of therapy and cultures - Deescalate therapy as clinically indicated  Height: 6\' 3"  (190.5 cm) Weight: 117.9 kg (260 lb) IBW/kg (Calculated) : 84.5  Temp (24hrs), Avg:98.5 F (36.9 C), Min:97.5 F (36.4 C), Max:99.5 F (37.5 C)  Recent Labs  Lab 08/20/19 1422 08/26/19 1001 08/26/19 1237  WBC  --  26.6*  --   CREATININE 0.97 0.96  --   LATICACIDVEN  --  2.3* 2.8*    Estimated Creatinine Clearance: 113.3 mL/min (by C-G formula based on SCr of 0.96 mg/dL).    Allergies  Allergen Reactions  . Invokana [Canagliflozin]     Extremity edema/caused pain  . Codeine Camsylate [Codeine] Rash  . Morphine And Related Rash    Antimicrobials this admission: 8/5 Vancomycin >> 8/5 Zosyn >> 8/5 Ceftriaxone x1 dose  Microbiology results: 8/5 BCx; sent 8/5 UCx: sent  Thank you for allowing pharmacy to be a part of this patient's care.  10/5, PharmD, RPh  PGY-1 Pharmacy Resident 08/26/2019 1:56 PM  Please check [email or web address] for unit-specific pharmacy phone numbers.

## 2019-08-26 NOTE — Progress Notes (Signed)
Notified bedside nurse of need to draw repeat lactic acid. 

## 2019-08-26 NOTE — Progress Notes (Signed)
Pharmacy Antibiotic Note  [name] is a 59 y.o. male admitted on 08/26/2019 with cellulitis.  Pharmacy has been consulted for vancomycin dosing. Pt is afebrile but WBC is elevated at 26.6. SCr is WNL and lactic acid is >2.   Plan: Vancomycin 2gm IV x 1 then 750mg  IV Q12H F/u renal fxn, C&S, clinical status and trough at SS  Height: 6\' 3"  (190.5 cm) Weight: 117.9 kg (260 lb) IBW/kg (Calculated) : 84.5  Temp (24hrs), Avg:99.5 F (37.5 C), Min:99.5 F (37.5 C), Max:99.5 F (37.5 C)  Recent Labs  Lab 08/20/19 1422 08/26/19 1001  WBC  --  26.6*  CREATININE 0.97 0.96  LATICACIDVEN  --  2.3*    Estimated Creatinine Clearance: 113.3 mL/min (by C-G formula based on SCr of 0.96 mg/dL).    Allergies  Allergen Reactions  . Invokana [Canagliflozin]     Extremity edema/caused pain  . Codeine Camsylate [Codeine] Rash  . Morphine And Related Rash    Antimicrobials this admission: Vanc 8/5>> CTX x 1 8/5  Dose adjustments this admission: N/A  Microbiology results: Pending  Thank you for allowing pharmacy to be a part of this patient's care.  Jatziry Wechter, 08/22/19 08/26/2019 11:40 AM

## 2019-08-26 NOTE — ED Notes (Signed)
Lunch tray ordered 

## 2019-08-26 NOTE — Progress Notes (Signed)
Right lower extremity venous duplex has been completed. Preliminary results can be found in CV Proc through chart review.   08/26/19 2:53 PM Olen Cordial RVT

## 2019-08-26 NOTE — ED Notes (Signed)
Vascular ultra sound at bedside.

## 2019-08-26 NOTE — Progress Notes (Signed)
Notified bedside nurse of need to draw repeat lactic acid (on 5 N).

## 2019-08-26 NOTE — Progress Notes (Signed)
Notified bedside nurse of need to draw repeat lactic acid @ 1437.

## 2019-08-26 NOTE — Progress Notes (Signed)
Notified provider of need to draw lactic acid and repeat lactic acid @ 1440.

## 2019-08-26 NOTE — H&P (Addendum)
History and Physical    [name] OTL:[id number] DOB: 1960-09-30 DOA: 08/26/2019  PCP: [name], MD  Patient coming from: Home  I have personally briefly reviewed patient's old medical records in [name]  Chief Complaint: Right heel pain and swelling  HPI: [name] is a 59 y.o. 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 on outpatient basis and discharged home the same day presented back to ED with the complaint of worsening pain and swelling of the right heel.  According to patient, he was discharged home with no antibiotics however wound VAC was attached.  Wound VAC was dislodged on Sunday night by accident.  He followed up with Dr. [name] office on Monday and was seen by PA and was told that his wound looked better.  Subsequently, yesterday he received a call from Dr. [name] PA that his culture from the abscess is growing bacteria so he was prescribed Omnicef which he took last night and this morning however he started having worsening redness, pain and swelling of the right foot along with chills fever and sweating yesterday which persisted today so he came to the emergency department.  T-max was 102.  Other than that he does not have any other complaint.  ED Course: Upon arrival to ED, patient had some tachypnea as well as tachycardia.  Has significant leukocytosis with white cells of 26.6 as compared to normal last week.  He was diagnosed with severe sepsis based on tachycardia, tachypnea, leukocytosis and lactic acid of 2.2.  He received some antibiotics in the ED.  Orthopedic on call/Dr. Blaine Hamper was consulted and admission was deferred to hospital service.  Please note that patient is not vaccinated against COVID-19 and COVID-19 test is still  pending.  Review of Systems: As per HPI otherwise negative.    Past Medical History:  Diagnosis Date  . Diabetic neuropathy ([place])   . Diverticulitis   . History of hepatitis C    has been treated in the past  . History of kidney stones   . Hyperlipidemia   . Hypertension   . Other testicular hypofunction   . Type II or unspecified type diabetes mellitus without mention of complication, not stated as uncontrolled   . Vitamin D deficiency     Past Surgical History:  Procedure Laterality Date  . CHOLECYSTECTOMY  1987  . COLONOSCOPY    . I & D EXTREMITY Right 08/20/2019   Procedure: IRRIGATION & DEBRIDEMENT RIGHT FOOT PARTIAL CALCANEAL EXCISION;  Surgeon: Newt Minion, MD;  Location: [facility];  Service: Orthopedics;  Laterality: Right;  . ORIF TIBIA FRACTURE     x 3 right leg     reports that he has been smoking cigarettes. He has been smoking about 1.00 pack per day. He has never used smokeless tobacco. He reports current drug use. Drug: Marijuana. He reports that he does not drink alcohol.  Allergies  Allergen Reactions  . Invokana [Canagliflozin]     Extremity edema/caused pain  . Codeine Camsylate [Codeine] Rash  . Morphine And Related Rash    Family History  Problem Relation Age of Onset  . Hypertension Mother   . Cancer Father        colon  . Alzheimer's disease Father   . Stroke Father     Prior  to Admission medications   Medication Sig Start Date End Date Taking? Authorizing Provider  aspirin 81 MG tablet Take 81 mg by mouth daily.    [provider]  cefdinir (OMNICEF) 300 MG capsule Take 1 capsule (300 mg total) by mouth 2 (two) times daily. 08/25/19   Persons, [name], [place]  Cholecalciferol 50000 units TABS Take 1 tablet by mouth every other day.     [provider]  citalopram (CELEXA) 40 MG tablet Take 1 tablet Daily for Mood & Anxiety 05/27/19   [name], MD  doxycycline (VIBRA-TABS) 100 MG tablet Take 1 tablet (100 mg total) by  mouth 2 (two) times daily. 07/16/19   Persons, Michael Palmer, PA  gabapentin (NEURONTIN) 800 MG tablet Take 1/2 to 1 tablet     2 to 3 x /day as needed for Neuropathy Pains 07/08/19   [name], MD  glipiZIDE (GLUCOTROL) 5 MG tablet Take 1 tablet 3 x /day with Meals for Diabetes 07/08/19   [name], MD  hydrochlorothiazide (HYDRODIURIL) 25 MG tablet Take 1 tablet by mouth once daily 02/09/19   Garnet Sierras, NP  lisinopril (ZESTRIL) 20 MG tablet Take 1 tablet Daily for BP & Diabetic Kidney Protection 05/27/19   [name], MD  meclizine (ANTIVERT) 25 MG tablet 1/2-1 pill up to 3 times daily for motion sickness/dizziness 07/21/19   [name], MD  meloxicam (MOBIC) 15 MG tablet Take one daily with food for 2 weeks, can take with tylenol, can not take with aleve, iburpofen, then as needed daily for pain 11/23/18   Liane Comber, NP  metFORMIN (GLUCOPHAGE-XR) 500 MG 24 hr tablet Take 1/2 to 1 or  2 tablets 2 x /day with Meals for Diabetes for Diabetes 01/20/19   [name], MD  oxyCODONE-acetaminophen (PERCOCET) 5-325 MG tablet Take 1 tablet by mouth every 4 (four) hours as needed. 08/20/19 08/19/20  Persons, Michael Palmer, PA  STEGLATRO 15 MG TABS Take 15 mg by mouth daily before breakfast. 05/24/19   Garnet Sierras, NP  verapamil (CALAN-SR) 240 MG CR tablet Take 1 tablet Daily with Food for BP & Heart  Rhythm 07/19/19   [name], MD    Physical Exam: Vitals:   08/26/19 1258 08/26/19 1313 08/26/19 1315 08/26/19 1321  BP: 114/82  138/75   Pulse: 96 96 90 93  Resp: (!) 33 (!) 29 (!) 25 (!) 21  Temp:      TempSrc:      SpO2: 100% 100% 100% 100%  Weight:      Height:        Constitutional: NAD, calm, comfortable Vitals:   08/26/19 1258 08/26/19 1313 08/26/19 1315 08/26/19 1321  BP: 114/82  138/75   Pulse: 96 96 90 93  Resp: (!) 33 (!) 29 (!) 25 (!) 21  Temp:      TempSrc:      SpO2: 100% 100% 100% 100%  Weight:      Height:       Eyes: PERRL, lids and  conjunctivae normal ENMT: Mucous membranes are moist. Posterior pharynx clear of any exudate or lesions.Normal dentition.  Neck: normal, supple, no masses, no thyromegaly Respiratory: clear to auscultation bilaterally, no wheezing, no crackles. Normal respiratory effort. No accessory muscle use.  Cardiovascular: Regular rate and rhythm, no murmurs / rubs / gallops.  +1 pitting edema left lower extremity .No carotid bruits.  Abdomen: no tenderness, no masses palpated. No hepatosplenomegaly. Bowel sounds positive.  Musculoskeletal: no clubbing / cyanosis. No joint  deformity upper and lower extremities. Good ROM, no contractures. Normal muscle tone.  Left calf tenderness. Skin: Significant erythema extending from left heel all the way to the lower part of the left leg which is tender and warm to touch. Neurologic: CN 2-12 grossly intact. Sensation intact, DTR normal. Strength 5/5 in all 4.  Psychiatric: Normal judgment and insight. Alert and oriented x 3. Normal mood.        Labs on Admission: I have personally reviewed following labs and imaging studies  CBC: Recent Labs  Lab 08/26/19 1001  WBC 26.6*  NEUTROABS 23.9*  HGB 14.3  HCT 42.2  MCV 91.9  PLT 073   Basic Metabolic Panel: Recent Labs  Lab 08/20/19 1422 08/26/19 1001  NA 130* 126*  K 4.3 4.0  CL 96* 95*  CO2 21* 19*  GLUCOSE 163* 132*  BUN 15 15  CREATININE 0.97 0.96  CALCIUM 8.9 8.8*   GFR: Estimated Creatinine Clearance: 113.3 mL/min (by C-G formula based on SCr of 0.96 mg/dL). Liver Function Tests: Recent Labs  Lab 08/20/19 1422 08/26/19 1001  AST 39 79*  ALT 48* 67*  ALKPHOS 62 90  BILITOT 1.1 1.1  PROT 7.2 6.9  ALBUMIN 2.8* 2.0*   No results for input(s): LIPASE, AMYLASE in the last 168 hours. No results for input(s): AMMONIA in the last 168 hours. Coagulation Profile: Recent Labs  Lab 08/26/19 1237  INR 1.3*   Cardiac Enzymes: No results for input(s): CKTOTAL, CKMB, CKMBINDEX, TROPONINI in  the last 168 hours. BNP (last 3 results) No results for input(s): PROBNP in the last 8760 hours. HbA1C: No results for input(s): HGBA1C in the last 72 hours. CBG: Recent Labs  Lab 08/20/19 1312 08/20/19 1712  GLUCAP 218* 136*   Lipid Profile: No results for input(s): CHOL, HDL, LDLCALC, TRIG, CHOLHDL, LDLDIRECT in the last 72 hours. Thyroid Function Tests: No results for input(s): TSH, T4TOTAL, FREET4, T3FREE, THYROIDAB in the last 72 hours. Anemia Panel: No results for input(s): VITAMINB12, FOLATE, FERRITIN, TIBC, IRON, RETICCTPCT in the last 72 hours. Urine analysis:    Component Value Date/Time   COLORURINE YELLOW 07/19/2019 1510   APPEARANCEUR CLEAR 07/19/2019 1510   LABSPEC 1.032 07/19/2019 1510   PHURINE < OR = 5.0 07/19/2019 1510   GLUCOSEU 3+ (A) 07/19/2019 1510   HGBUR NEGATIVE 07/19/2019 1510   BILIRUBINUR NEGATIVE 12/04/2015 1042   KETONESUR NEGATIVE 07/19/2019 1510   PROTEINUR NEGATIVE 07/19/2019 1510   UROBILINOGEN 1.0 11/01/2010 2007   NITRITE NEGATIVE 07/19/2019 1510   LEUKOCYTESUR NEGATIVE 07/19/2019 1510    Radiological Exams on Admission: DG Chest Port 1 View  Result Date: 08/26/2019 CLINICAL DATA:  Possible sepsis. EXAM: PORTABLE CHEST 1 VIEW COMPARISON:  October 13, 2016. FINDINGS: The heart size and mediastinal contours are within normal limits. Both lungs are clear. The visualized skeletal structures are unremarkable. IMPRESSION: No active disease. Electronically Signed   By: Marijo Conception M.D.   On: 08/26/2019 12:13    EKG: Independently reviewed.  Sinus rhythm, no ST-T wave elevation or depression.  Assessment/Plan Principal Problem:   Severe sepsis ([name]) Active Problems:   Essential hypertension   T2_NIDDM w/Peripheral Neuropathy   COPD (chronic obstructive pulmonary disease) ([name])   Poorly controlled diabetes mellitus ([name])   Osteomyelitis of ankle or foot, acute ([name])   Hyponatremia   Severe sepsis secondary to right calcaneal  abscess/osteomyelitis, POA: Patient met sepsis criteria based on tachycardia, tachypnea, leukocytosis lactic acid of 2.3.  Code sepsis was activated.  On clinical examination, he seems to have significant cellulitis as shown in the pictures above.  MRI of the foot is still pending.  Orthopedics has been consulted.  We will admit to MedSurg unit.  Start on IV fluids normal saline at 125 cc/h.  Repeat lactic acid later and CBC in the morning.  Monitor fever curve.  Currently afebrile.  Initiated sepsis order protocol and cellulitis order protocol and based on that we will start on Zosyn and vancomycin.  Blood cultures have been drawn.  His culture from the abscess is growing gram-positive cocci and gram-negative rods.  As needed pain medications.  He has swelling in the left lower extremity and calf tenderness.  Would obtain Doppler ultrasound to rule out DVT.  Essential hypertension: Patient takes lisinopril, hydrochlorothiazide and verapamil.  Currently will resume only verapamil as his blood pressure is stable and he is at risk of dropping blood pressure.  Monitor closely.  Hyponatremia: Has a history of hyponatremia in the past with sodium ranging around 130 usually but currently 126.  He is on hydrochlorothiazide which I will hold.  Monitor.  Type 2 diabetes mellitus with polyneuropathy: Hold oral hypoglycemic agents.  Start on SSI.  Check hemoglobin A1c.  DVT prophylaxis: enoxaparin (LOVENOX) injection 40 mg Start: 08/26/19 1345 Code Status: Full code Family Communication: Wife present at bedside.  Plan of care discussed with patient in length with patient and wife and they both verbalized understanding and agreed with it. Disposition Plan: We will need hospitalization for next few days Consults called: Orthopedics Admission status: Inpatient   Status is: Inpatient  Remains inpatient appropriate because:Inpatient level of care appropriate due to severity of illness   Dispo: The patient is  from: Home              Anticipated d/c is to: Home              Anticipated d/c date is: 3 days              Patient currently is not medically stable to d/c.    Darliss Cheney MD [facility]  08/26/2019, 1:34 PM  To contact the attending provider between 7A-7P or the covering provider during after hours 7P-7A, please log into the web site [email or web address]

## 2019-08-27 ENCOUNTER — Inpatient Hospital Stay (HOSPITAL_COMMUNITY): Payer: BC Managed Care – PPO | Admitting: Certified Registered Nurse Anesthetist

## 2019-08-27 ENCOUNTER — Encounter (HOSPITAL_COMMUNITY)
Admission: EM | Disposition: A | Discharge status: Rehabilitation Facility | Payer: Self-pay | Source: Home / Self Care | Attending: Family Medicine

## 2019-08-27 ENCOUNTER — Encounter (HOSPITAL_COMMUNITY): Payer: Self-pay | Admitting: Family Medicine

## 2019-08-27 DIAGNOSIS — R6520 Severe sepsis without septic shock: Secondary | ICD-10-CM

## 2019-08-27 DIAGNOSIS — A419 Sepsis, unspecified organism: Secondary | ICD-10-CM

## 2019-08-27 HISTORY — PX: I & D EXTREMITY: SHX5045

## 2019-08-27 LAB — CBC
HCT: 35 % — ABNORMAL LOW (ref 39.0–52.0)
Hemoglobin: 11.6 g/dL — ABNORMAL LOW (ref 13.0–17.0)
MCH: 30.5 pg (ref 26.0–34.0)
MCHC: 33.1 g/dL (ref 30.0–36.0)
MCV: 92.1 fL (ref 80.0–100.0)
Platelets: 211 10*3/uL (ref 150–400)
RBC: 3.8 MIL/uL — ABNORMAL LOW (ref 4.22–5.81)
RDW: 13.3 % (ref 11.5–15.5)
WBC: 21.5 10*3/uL — ABNORMAL HIGH (ref 4.0–10.5)
nRBC: 0 % (ref 0.0–0.2)

## 2019-08-27 LAB — BASIC METABOLIC PANEL
Anion gap: 12 (ref 5–15)
BUN: 18 mg/dL (ref 6–20)
CO2: 19 mmol/L — ABNORMAL LOW (ref 22–32)
Calcium: 8.4 mg/dL — ABNORMAL LOW (ref 8.9–10.3)
Chloride: 99 mmol/L (ref 98–111)
Creatinine, Ser: 0.89 mg/dL (ref 0.61–1.24)
GFR calc Af Amer: >60 mL/min (ref >60)
GFR calc non Af Amer: >60 mL/min (ref >60)
Glucose, Bld: 95 mg/dL (ref 70–99)
Potassium: 3.8 mmol/L (ref 3.5–5.1)
Sodium: 130 mmol/L — ABNORMAL LOW (ref 135–145)

## 2019-08-27 LAB — GLUCOSE, CAPILLARY
Glucose-Capillary: 98 mg/dL (ref 70–99)
Glucose-Capillary: 92 mg/dL (ref 70–99)
Glucose-Capillary: 87 mg/dL (ref 70–99)
Glucose-Capillary: 120 mg/dL — ABNORMAL HIGH (ref 70–99)

## 2019-08-27 LAB — PROTIME-INR
INR: 1.4 — ABNORMAL HIGH (ref 0.8–1.2)
Prothrombin Time: 16.5 seconds — ABNORMAL HIGH (ref 11.4–15.2)

## 2019-08-27 LAB — SURGICAL PCR SCREEN
MRSA, PCR: NEGATIVE
Staphylococcus aureus: NEGATIVE

## 2019-08-27 LAB — PROCALCITONIN: Procalcitonin: 2.67 ng/mL

## 2019-08-27 LAB — CORTISOL-AM, BLOOD: Cortisol - AM: 34 ug/dL — ABNORMAL HIGH (ref 6.7–22.6)

## 2019-08-27 SURGERY — IRRIGATION AND DEBRIDEMENT EXTREMITY
Anesthesia: General | Laterality: Right

## 2019-08-27 MED ORDER — PROPOFOL 10 MG/ML IV BOLUS
INTRAVENOUS | Status: DC | PRN
  Administered 2019-08-27: 120 mg via INTRAVENOUS

## 2019-08-27 MED ORDER — PROPOFOL 10 MG/ML IV BOLUS
INTRAVENOUS | Status: AC
  Filled 2019-08-27: qty 20

## 2019-08-27 MED ORDER — ONDANSETRON HCL 4 MG/2ML IJ SOLN: 4.0000 mg | Freq: Four times a day (QID) | INTRAMUSCULAR | Status: DC | PRN

## 2019-08-27 MED ORDER — METHOCARBAMOL 1000 MG/10ML IJ SOLN
500.0000 mg | Freq: Four times a day (QID) | INTRAVENOUS | Status: DC | PRN
  Filled 2019-08-27: qty 5

## 2019-08-27 MED ORDER — DIPHENHYDRAMINE HCL 12.5 MG/5ML PO ELIX: 25.0000 mg | ORAL_SOLUTION | ORAL | Status: DC | PRN

## 2019-08-27 MED ORDER — PROMETHAZINE HCL 25 MG/ML IJ SOLN: 6.2500 mg | INTRAMUSCULAR | Status: DC | PRN

## 2019-08-27 MED ORDER — ONDANSETRON HCL 4 MG/2ML IJ SOLN
INTRAMUSCULAR | Status: AC
  Filled 2019-08-27: qty 2

## 2019-08-27 MED ORDER — OXYCODONE HCL 5 MG/5ML PO SOLN: 5.0000 mg | Freq: Once | ORAL | Status: DC | PRN

## 2019-08-27 MED ORDER — FENTANYL CITRATE (PF) 250 MCG/5ML IJ SOLN
INTRAMUSCULAR | Status: AC
  Filled 2019-08-27: qty 5

## 2019-08-27 MED ORDER — LACTATED RINGERS IV SOLN: INTRAVENOUS | Status: DC

## 2019-08-27 MED ORDER — FENTANYL CITRATE (PF) 100 MCG/2ML IJ SOLN
INTRAMUSCULAR | Status: DC | PRN
  Administered 2019-08-27 (×4): 50 ug via INTRAVENOUS

## 2019-08-27 MED ORDER — ONDANSETRON HCL 4 MG PO TABS: 4.0000 mg | ORAL_TABLET | Freq: Four times a day (QID) | ORAL | Status: DC | PRN

## 2019-08-27 MED ORDER — SODIUM CHLORIDE 0.9 % IR SOLN
Status: DC | PRN
  Administered 2019-08-27: 3000 mL

## 2019-08-27 MED ORDER — ACETAMINOPHEN 325 MG PO TABS
325.0000 mg | ORAL_TABLET | Freq: Four times a day (QID) | ORAL | Status: DC | PRN
  Administered 2019-09-02: 650 mg via ORAL
  Filled 2019-08-27: qty 2

## 2019-08-27 MED ORDER — MIDAZOLAM HCL 2 MG/2ML IJ SOLN
INTRAMUSCULAR | Status: AC
  Filled 2019-08-27: qty 2

## 2019-08-27 MED ORDER — OXYCODONE HCL 5 MG PO TABS: 5.0000 mg | ORAL_TABLET | Freq: Once | ORAL | Status: DC | PRN

## 2019-08-27 MED ORDER — ACETAMINOPHEN 500 MG PO TABS
1000.0000 mg | ORAL_TABLET | Freq: Four times a day (QID) | ORAL | Status: AC
  Administered 2019-08-28 (×3): 1000 mg via ORAL
  Filled 2019-08-27 (×4): qty 2

## 2019-08-27 MED ORDER — HYDROMORPHONE HCL 1 MG/ML IJ SOLN
INTRAMUSCULAR | Status: AC
  Filled 2019-08-27: qty 1

## 2019-08-27 MED ORDER — ENOXAPARIN SODIUM 40 MG/0.4ML ~~LOC~~ SOLN: 40.0000 mg | SUBCUTANEOUS | Status: DC

## 2019-08-27 MED ORDER — HYDROMORPHONE HCL 1 MG/ML IJ SOLN
0.5000 mg | INTRAMUSCULAR | Status: DC | PRN
  Administered 2019-08-30 – 2019-09-03 (×9): 1 mg via INTRAVENOUS
  Filled 2019-08-27 (×9): qty 1

## 2019-08-27 MED ORDER — SUCCINYLCHOLINE CHLORIDE 20 MG/ML IJ SOLN
INTRAMUSCULAR | Status: DC | PRN
  Administered 2019-08-27: 120 mg via INTRAVENOUS

## 2019-08-27 MED ORDER — AMISULPRIDE (ANTIEMETIC) 5 MG/2ML IV SOLN: 10.0000 mg | Freq: Once | INTRAVENOUS | Status: DC | PRN

## 2019-08-27 MED ORDER — SORBITOL 70 % SOLN: 30.0000 mL | Freq: Every day | Status: DC | PRN

## 2019-08-27 MED ORDER — MAGNESIUM CITRATE PO SOLN: 1.0000 | Freq: Once | ORAL | Status: DC | PRN

## 2019-08-27 MED ORDER — SUCCINYLCHOLINE CHLORIDE 200 MG/10ML IV SOSY
PREFILLED_SYRINGE | INTRAVENOUS | Status: AC
  Filled 2019-08-27: qty 10

## 2019-08-27 MED ORDER — SODIUM CHLORIDE 0.9 % IV SOLN: INTRAVENOUS | Status: DC

## 2019-08-27 MED ORDER — CHLORHEXIDINE GLUCONATE 0.12 % MT SOLN: 15.0000 mL | OROMUCOSAL | Status: AC

## 2019-08-27 MED ORDER — TRAMADOL HCL 50 MG PO TABS
50.0000 mg | ORAL_TABLET | Freq: Four times a day (QID) | ORAL | Status: DC
  Administered 2019-08-28 – 2019-09-03 (×26): 50 mg via ORAL
  Filled 2019-08-27 (×27): qty 1

## 2019-08-27 MED ORDER — POLYETHYLENE GLYCOL 3350 17 G PO PACK: 17.0000 g | PACK | Freq: Every day | ORAL | Status: DC | PRN

## 2019-08-27 MED ORDER — OXYCODONE HCL 5 MG PO TABS
10.0000 mg | ORAL_TABLET | ORAL | Status: DC | PRN
  Filled 2019-08-27: qty 2

## 2019-08-27 MED ORDER — METHOCARBAMOL 500 MG PO TABS
500.0000 mg | ORAL_TABLET | Freq: Four times a day (QID) | ORAL | Status: DC | PRN
  Administered 2019-08-27 – 2019-09-03 (×9): 500 mg via ORAL
  Filled 2019-08-27 (×8): qty 1

## 2019-08-27 MED ORDER — ONDANSETRON HCL 4 MG/2ML IJ SOLN
INTRAMUSCULAR | Status: DC | PRN
  Administered 2019-08-27: 4 mg via INTRAVENOUS

## 2019-08-27 MED ORDER — HYDROMORPHONE HCL 1 MG/ML IJ SOLN
0.2500 mg | INTRAMUSCULAR | Status: DC | PRN
  Administered 2019-08-27: 0.25 mg via INTRAVENOUS

## 2019-08-27 MED ORDER — OXYCODONE HCL 5 MG PO TABS
5.0000 mg | ORAL_TABLET | ORAL | Status: DC | PRN
  Administered 2019-08-28 – 2019-08-31 (×3): 10 mg via ORAL
  Administered 2019-09-02: 5 mg via ORAL
  Administered 2019-09-02 – 2019-09-03 (×4): 10 mg via ORAL
  Filled 2019-08-27 (×7): qty 2

## 2019-08-27 MED ORDER — METHOCARBAMOL 500 MG PO TABS
ORAL_TABLET | ORAL | Status: AC
  Filled 2019-08-27: qty 1

## 2019-08-27 MED ORDER — MIDAZOLAM HCL 2 MG/2ML IJ SOLN
INTRAMUSCULAR | Status: DC | PRN
  Administered 2019-08-27: 1 mg via INTRAVENOUS

## 2019-08-27 MED ORDER — ROCURONIUM BROMIDE 10 MG/ML (PF) SYRINGE
PREFILLED_SYRINGE | INTRAVENOUS | Status: AC
  Filled 2019-08-27: qty 10

## 2019-08-27 MED ORDER — LIDOCAINE 2% (20 MG/ML) 5 ML SYRINGE
INTRAMUSCULAR | Status: AC
  Filled 2019-08-27: qty 5

## 2019-08-27 MED ORDER — LIDOCAINE 2% (20 MG/ML) 5 ML SYRINGE
INTRAMUSCULAR | Status: DC | PRN
  Administered 2019-08-27: 100 mg via INTRAVENOUS

## 2019-08-27 MED ORDER — ENOXAPARIN SODIUM 60 MG/0.6ML ~~LOC~~ SOLN
60.0000 mg | SUBCUTANEOUS | Status: DC
  Administered 2019-08-28 – 2019-09-03 (×6): 60 mg via SUBCUTANEOUS
  Filled 2019-08-27 (×8): qty 0.6

## 2019-08-27 MED ORDER — METOCLOPRAMIDE HCL 5 MG PO TABS: 5.0000 mg | ORAL_TABLET | Freq: Three times a day (TID) | ORAL | Status: DC | PRN

## 2019-08-27 MED ORDER — DOCUSATE SODIUM 100 MG PO CAPS
100.0000 mg | ORAL_CAPSULE | Freq: Two times a day (BID) | ORAL | Status: DC
  Administered 2019-08-28 – 2019-09-02 (×8): 100 mg via ORAL
  Filled 2019-08-27 (×11): qty 1

## 2019-08-27 MED ORDER — METOCLOPRAMIDE HCL 5 MG/ML IJ SOLN: 5.0000 mg | Freq: Three times a day (TID) | INTRAMUSCULAR | Status: DC | PRN

## 2019-08-27 MED ORDER — SUGAMMADEX SODIUM 200 MG/2ML IV SOLN
INTRAVENOUS | Status: DC | PRN
  Administered 2019-08-27: 200 mg via INTRAVENOUS

## 2019-08-27 MED ORDER — CHLORHEXIDINE GLUCONATE 0.12 % MT SOLN
OROMUCOSAL | Status: AC
  Administered 2019-08-27: 15 mL via OROMUCOSAL
  Filled 2019-08-27: qty 15

## 2019-08-27 MED ORDER — ROCURONIUM BROMIDE 10 MG/ML (PF) SYRINGE
PREFILLED_SYRINGE | INTRAVENOUS | Status: DC | PRN
  Administered 2019-08-27: 50 mg via INTRAVENOUS

## 2019-08-27 SURGICAL SUPPLY — 51 items
BNDG COHESIVE 4X5 TAN STRL (GAUZE/BANDAGES/DRESSINGS) ×3 IMPLANT
BNDG COHESIVE 6X5 TAN STRL LF (GAUZE/BANDAGES/DRESSINGS) ×6 IMPLANT
BNDG ELASTIC 3X5.8 VLCR STR LF (GAUZE/BANDAGES/DRESSINGS) IMPLANT
BNDG ELASTIC 4X5.8 VLCR STR LF (GAUZE/BANDAGES/DRESSINGS) ×2 IMPLANT
BNDG GAUZE ELAST 4 BULKY (GAUZE/BANDAGES/DRESSINGS) ×3 IMPLANT
CANISTER WOUND CARE 500ML ATS (WOUND CARE) ×2 IMPLANT
COVER SURGICAL LIGHT HANDLE (MISCELLANEOUS) ×3 IMPLANT
COVER WAND RF STERILE (DRAPES) ×3 IMPLANT
CUFF TOURN SGL QUICK 24 (TOURNIQUET CUFF)
CUFF TOURN SGL QUICK 34 (TOURNIQUET CUFF)
CUFF TOURN SGL QUICK 42 (TOURNIQUET CUFF) IMPLANT
CUFF TRNQT CYL 24X4X16.5-23 (TOURNIQUET CUFF) IMPLANT
CUFF TRNQT CYL 34X4.125X (TOURNIQUET CUFF) IMPLANT
DRAPE U-SHAPE 47X51 STRL (DRAPES) ×3 IMPLANT
DRSG PAD ABDOMINAL 8X10 ST (GAUZE/BANDAGES/DRESSINGS) ×3 IMPLANT
DRSG VAC ATS SM SENSATRAC (GAUZE/BANDAGES/DRESSINGS) ×2 IMPLANT
DURAPREP 26ML APPLICATOR (WOUND CARE) ×3 IMPLANT
ELECT REM PT RETURN 9FT ADLT (ELECTROSURGICAL)
ELECTRODE REM PT RTRN 9FT ADLT (ELECTROSURGICAL) IMPLANT
GAUZE SPONGE 4X4 12PLY STRL (GAUZE/BANDAGES/DRESSINGS) ×6 IMPLANT
GAUZE XEROFORM 5X9 LF (GAUZE/BANDAGES/DRESSINGS) ×3 IMPLANT
GLOVE BIOGEL PI IND STRL 7.0 (GLOVE) ×1 IMPLANT
GLOVE BIOGEL PI INDICATOR 7.0 (GLOVE) ×2
GLOVE ECLIPSE 7.0 STRL STRAW (GLOVE) ×3 IMPLANT
GLOVE SKINSENSE NS SZ7.5 (GLOVE) ×4
GLOVE SKINSENSE STRL SZ7.5 (GLOVE) ×2 IMPLANT
GOWN STRL REIN XL XLG (GOWN DISPOSABLE) ×6 IMPLANT
HANDPIECE INTERPULSE COAX TIP (DISPOSABLE)
KIT BASIN OR (CUSTOM PROCEDURE TRAY) ×3 IMPLANT
KIT TURNOVER KIT B (KITS) ×3 IMPLANT
MANIFOLD NEPTUNE II (INSTRUMENTS) ×3 IMPLANT
PACK ORTHO EXTREMITY (CUSTOM PROCEDURE TRAY) ×3 IMPLANT
PAD ARMBOARD 7.5X6 YLW CONV (MISCELLANEOUS) ×6 IMPLANT
PADDING CAST ABS 4INX4YD NS (CAST SUPPLIES) ×2
PADDING CAST ABS COTTON 4X4 ST (CAST SUPPLIES) ×1 IMPLANT
PADDING CAST COTTON 6X4 STRL (CAST SUPPLIES) ×3 IMPLANT
SET HNDPC FAN SPRY TIP SCT (DISPOSABLE) IMPLANT
SPONGE LAP 18X18 RF (DISPOSABLE) ×3 IMPLANT
STAPLER VISISTAT (STAPLE) ×2 IMPLANT
STOCKINETTE IMPERVIOUS 9X36 MD (GAUZE/BANDAGES/DRESSINGS) ×3 IMPLANT
SUT ETHILON 2 0 FS 18 (SUTURE) ×3 IMPLANT
SUT ETHILON 2 0 PSLX (SUTURE) IMPLANT
SUT VIC AB 2-0 FS1 27 (SUTURE) ×6 IMPLANT
SWAB CULTURE ESWAB REG 1ML (MISCELLANEOUS) IMPLANT
TOWEL GREEN STERILE (TOWEL DISPOSABLE) ×3 IMPLANT
TOWEL GREEN STERILE FF (TOWEL DISPOSABLE) ×3 IMPLANT
TUBE CONNECTING 12'X1/4 (SUCTIONS) ×1
TUBE CONNECTING 12X1/4 (SUCTIONS) ×2 IMPLANT
UNDERPAD 30X36 HEAVY ABSORB (UNDERPADS AND DIAPERS) ×6 IMPLANT
WATER STERILE IRR 1000ML POUR (IV SOLUTION) ×3 IMPLANT
YANKAUER SUCT BULB TIP NO VENT (SUCTIONS) ×3 IMPLANT

## 2019-08-27 NOTE — Op Note (Signed)
   Date of Surgery: 08/27/2019  INDICATIONS: Mr. Ricardo is a 60 y.o.-year-old male with a right foot gangrenous infection.  The patient did consent to the procedure after discussion of the risks and benefits.  PREOPERATIVE DIAGNOSIS:  1. Right foot gangrenous infection 2. Peripheral vascular disease  POSTOPERATIVE DIAGNOSIS: Same.  PROCEDURE:  1. Excisional debridement of right foot and ankle including skin, subcutaneous tissue, muscle, tendon, bone 385 sq cm 2. Syme amputation of right lower extremity 3. Application of wound vac greater than 50 sq cm  SURGEON: N. Glee Arvin, M.D.  ASSIST: Starlyn Skeans [place], [place]; necessary for the timely completion of procedure and due to complexity of procedure.  ANESTHESIA:  general  IV FLUIDS AND URINE: See anesthesia.  ESTIMATED BLOOD LOSS: 50 mL.  IMPLANTS: none  DRAINS: wound VAC  COMPLICATIONS: see description of procedure.  DESCRIPTION OF PROCEDURE: The patient was brought to the operating room.  The patient had been signed prior to the procedure and this was documented. The patient had the anesthesia placed by the anesthesiologist.  A time-out was performed to confirm that this was the correct patient, site, side and location. The patient did receive antibiotics prior to the incision and was re-dosed during the procedure as needed at indicated intervals.  A tourniquet was placed.  The patient was placed prone with all bony prominences well padded.  The patient had the operative extremity prepped and draped in the standard surgical fashion.    The previous sutures were first removed.  I then used a #10 blade and performed sharp excisional debridement of all necrotic and gangrenous tissues.  Debridement was carried from the skin all the way down to the bone.  The calcaneus was black and did not exhibit any evidence of viability.  I continued my excisional debridement little by little extending out in a circular fashion but I was unable to  encounter any viable tissue distally.  Altogether I debrided 385 cm of skin, muscle, subcutaneous tissue, bone.  I also extended my debridement proximally.  The Achilles tendon was completely eroded away by the infection.  All of the tissue was extremely foul smelling.  After thorough debridement it was obvious that the foot was not salvageable and therefore I decided to perform an amputation through the ankle joint leaving both malleoli intact.  I Debrided until I was able to find skin edges that bled.  At that point we thoroughly irrigated the surgical wound with 3 L of normal saline.  Given the soft tissue loss primary closure was not possible therefore a wound VAC was placed over the exposed ends of the distal tibia and fibula.  Patient tolerated the procedure well had no immediate complications.  POSTOPERATIVE PLAN: patient will need to return to the OR for repeat I&D and likely BKA.  Mayra Reel, MD 8:59 PM

## 2019-08-27 NOTE — Anesthesia Preprocedure Evaluation (Signed)
Anesthesia Evaluation  Patient identified by MRN, date of birth, ID band Patient awake    Reviewed: Allergy & Precautions, NPO status , Patient's Chart, lab work & pertinent test results, reviewed documented beta blocker date and time   Airway Mallampati: II  TM Distance: >3 FB Neck ROM: Full    Dental   Pulmonary COPD,  COPD inhaler, Current Smoker and Patient abstained from smoking.,    Pulmonary exam normal breath sounds clear to auscultation + decreased breath sounds      Cardiovascular hypertension, Pt. on medications Normal cardiovascular exam Rhythm:Regular Rate:Normal     Neuro/Psych Diabetic peripheral neuropathy negative psych ROS   GI/Hepatic (+) Hepatitis -, CHas been treated in the past Diverticulosis   Endo/Other  diabetes, Poorly Controlled, Type 2, Oral Hypoglycemic AgentsHyperlipidemia  Renal/GU Renal diseaseHx/o renal calculi  negative genitourinary   Musculoskeletal Necrotic right calcaneal ulcer   Abdominal (+) + obese,   Peds  Hematology negative hematology ROS (+)   Anesthesia Other Findings   Reproductive/Obstetrics                             Anesthesia Physical  Anesthesia Plan  ASA: III  Anesthesia Plan: General   Post-op Pain Management:    Induction: Intravenous  PONV Risk Score and Plan: 2 and Treatment may vary due to age or medical condition, Ondansetron and Midazolam  Airway Management Planned: LMA  Additional Equipment:   Intra-op Plan:   Post-operative Plan: Extubation in OR  Informed Consent: I have reviewed the patients History and Physical, chart, labs and discussed the procedure including the risks, benefits and alternatives for the proposed anesthesia with the patient or authorized representative who has indicated his/her understanding and acceptance.     Dental advisory given  Plan Discussed with: CRNA and  Anesthesiologist  Anesthesia Plan Comments:         Anesthesia Quick Evaluation

## 2019-08-27 NOTE — Consult Note (Signed)
ORTHOPAEDIC CONSULTATION  REQUESTING PHYSICIAN: Azucena FallenLancaster, William C, MD  Chief Complaint: Right foot infection  HPI: [name] is a 59 y.o. male who presents with concern for right foot infection s/p I&D right foot and partial calcaneal excision by Dr. Lajoyce Cornersuda last week.  Reports fevers, chills.  Ortho consulted.  Past Medical History:  Diagnosis Date  . Diabetic neuropathy ([name])   . Diverticulitis   . History of hepatitis C    has been treated in the past  . History of kidney stones   . Hyperlipidemia   . Hypertension   . Other testicular hypofunction   . Type II or unspecified type diabetes mellitus without mention of complication, not stated as uncontrolled   . Vitamin D deficiency    Past Surgical History:  Procedure Laterality Date  . CHOLECYSTECTOMY  1987  . COLONOSCOPY    . I & D EXTREMITY Right 08/20/2019   Procedure: IRRIGATION & DEBRIDEMENT RIGHT FOOT PARTIAL CALCANEAL EXCISION;  Surgeon: Nadara Mustarduda, Marcus V, MD;  Location: [facility] OR;  Service: Orthopedics;  Laterality: Right;  . ORIF TIBIA FRACTURE     x 3 right leg   Social History   Socioeconomic History  . Marital status: Married    Spouse name: Not on file  . Number of children: Not on file  . Years of education: Not on file  . Highest education level: Not on file  Occupational History  . Not on file  Tobacco Use  . Smoking status: Current Every Day Smoker    Packs/day: 1.00    Types: Cigarettes  . Smokeless tobacco: Never Used  Substance and Sexual Activity  . Alcohol use: No  . Drug use: Yes    Types: Marijuana  . Sexual activity: Not on file  Other Topics Concern  . Not on file  Social History Narrative  . Not on file   Social Determinants of Health   Financial Resource Strain:   . Difficulty of Paying Living Expenses:   Food Insecurity:   . Worried About Programme researcher, broadcasting/film/videounning Out of Food in the Last Year:   . Baristaan Out of Food in the Last Year:   Transportation Needs:   . Freight forwarderLack of Transportation  (Medical):   Marland Kitchen. Lack of Transportation (Non-Medical):   Physical Activity:   . Days of Exercise per Week:   . Minutes of Exercise per Session:   Stress:   . Feeling of Stress :   Social Connections:   . Frequency of Communication with Friends and Family:   . Frequency of Social Gatherings with Friends and Family:   . Attends Religious Services:   . Active Member of Clubs or Organizations:   . Attends BankerClub or Organization Meetings:   Marland Kitchen. Marital Status:    Family History  Problem Relation Age of Onset  . Hypertension Mother   . Cancer Father        colon  . Alzheimer's disease Father   . Stroke Father    - negative except otherwise stated in the family history section Allergies  Allergen Reactions  . Invokamet [Canagliflozin-Metformin Hcl] Other (See Comments)    Extremity edema/caused pain  . Invokana [Canagliflozin] Other (See Comments)    Extremity edema/caused pain  . Codeine Camsylate [Codeine] Rash  . Morphine And Related Rash   Prior to Admission medications   Medication Sig Start Date End Date Taking? Authorizing Provider  aspirin 81 MG tablet Take 81 mg by mouth daily.   Yes [provider]  cefdinir (OMNICEF) 300 MG capsule Take 1 capsule (300 mg total) by mouth 2 (two) times daily. 08/25/19  Yes Persons, [place], [place]  Cholecalciferol 50000 units TABS Take 1 tablet by mouth 2 (two) times a week.    Yes [provider]  citalopram (CELEXA) 40 MG tablet Take 1 tablet Daily for Mood & Anxiety Patient taking differently: Take 40 mg by mouth daily. For mood & anxiety 05/27/19  Yes Lucky Cowboy, MD  gabapentin (NEURONTIN) 800 MG tablet Take 1/2 to 1 tablet     2 to 3 x /day as needed for Neuropathy Pains Patient taking differently: Take 800-1,600 mg by mouth See admin instructions. Take 1 tablet (800mg ) by mouth in the morning and 2 tablets (1600mg ) by mouth in the evening. 07/08/19  Yes , MD  glipiZIDE (GLUCOTROL) 5 MG tablet Take 1 tablet  3 x /day with Meals for Diabetes Patient taking differently: 5 mg 2 (two) times daily with a meal.  07/08/19  Yes Lucky Cowboy, MD  hydrochlorothiazide (HYDRODIURIL) 25 MG tablet Take 1 tablet by mouth once daily Patient taking differently: Take 25 mg by mouth daily.  02/09/19  Yes McClanahan, Lucky Cowboy, NP  lisinopril (ZESTRIL) 20 MG tablet Take 1 tablet Daily for BP & Diabetic Kidney Protection Patient taking differently: Take 20 mg by mouth daily. For blood pressure & diabetic kidney protection 05/27/19  Yes Bella Kennedy, MD  meclizine (ANTIVERT) 25 MG tablet 1/2-1 pill up to 3 times daily for motion sickness/dizziness Patient taking differently: Take 25 mg by mouth every evening.  07/21/19  Yes Lucky Cowboy, MD  metFORMIN (GLUCOPHAGE-XR) 500 MG 24 hr tablet Take 1/2 to 1 or  2 tablets 2 x /day with Meals for Diabetes for Diabetes Patient taking differently: Take 500 mg by mouth 2 (two) times daily with a meal. For diabetes 01/20/19  Yes Lucky Cowboy, MD  oxyCODONE-acetaminophen (PERCOCET) 5-325 MG tablet Take 1 tablet by mouth every 4 (four) hours as needed. Patient taking differently: Take 1 tablet by mouth every 4 (four) hours as needed for moderate pain or severe pain.  08/20/19 08/19/20 Yes Persons, 08/22/19, PA  STEGLATRO 15 MG TABS Take 15 mg by mouth daily before breakfast. 05/24/19  Yes McClanahan, Kyra, NP  verapamil (CALAN-SR) 240 MG CR tablet Take 1 tablet Daily with Food for BP & Heart  Rhythm Patient taking differently: Take 240 mg by mouth daily. For blood pressure & heart rhythm. Take with food 07/19/19  Yes 07/24/19, MD  doxycycline (VIBRA-TABS) 100 MG tablet Take 1 tablet (100 mg total) by mouth 2 (two) times daily. Patient not taking: Reported on 08/26/2019 07/16/19   Persons, 10/26/2019, 07/18/19  meloxicam (MOBIC) 15 MG tablet Take one daily with food for 2 weeks, can take with tylenol, can not take with aleve, iburpofen, then as needed daily for pain Patient not taking:  Reported on 08/26/2019 11/23/18   10/26/2019, NP   MR ANKLE RIGHT W WO CONTRAST  Result Date: 08/27/2019 CLINICAL DATA:  History of osteomyelitis and abscess of the right foot requiring debridement. EXAM: MRI OF THE RIGHT ANKLE WITHOUT AND WITH CONTRAST TECHNIQUE: Multiplanar, multisequence MR imaging of the ankle was performed before and after the administration of intravenous contrast. CONTRAST:  64mL GADAVIST GADOBUTROL 1 MMOL/ML IV SOLN COMPARISON:  08/08/2019 FINDINGS: Susceptibility artifact resulting from the orthopedic hardware in the distal tibia partially obscures the adjacent soft tissue and osseous structures especially at the level of the ankle joint. TENDONS  Peroneal: Peroneal longus tendon intact. Peroneal brevis intact. Posteromedial: Posterior tibial tendon intact. Flexor hallucis longus tendon intact. Flexor digitorum longus tendon intact. Anterior: Tibialis anterior tendon intact. Extensor hallucis longus tendon intact Extensor digitorum longus tendon intact. Achilles: Interval resection of the calcaneal attachment of the Achilles tendon. Plantar Fascia: Interval resection of the calcaneal attachment of the plantar fascia. LIGAMENTS Lateral: Obscured by susceptibility artifact resulting from the distal tibial intramedullary nail. Medial: Obscured by susceptibility artifact resulting from the distal tibial intramedullary nail. CARTILAGE Ankle Joint: Susceptibility artifact resulting from the orthopedic hardware in the distal tibia partially obscures the adjacent soft tissue and osseous structures especially at the level of the ankle joint. Subtalar Joints/Sinus Tarsi: Normal subtalar joints. No subtalar joint effusion. Normal sinus tarsi. Bones/Soft Tissue: Soft tissue ulcer or wound along the posteromedial aspect of the hindfoot at the level of the posterior calcaneus. Interval debridement of the hindfoot soft tissues. Interval posterior calcaneal resection with release of the calcaneal  attachment of the plantar fascia and Achilles tendon. No significant marrow signal abnormality. No periosteal reaction or bone destruction. Osteoarthritis of the talonavicular joint. Small amount of fluid along the flexor digitorum tendons at the level of the midfoot which may reflect a ganglion cyst or tenosynovial fluid. No surrounding inflammatory changes or enhancement to suggest an abscess. IMPRESSION: IMPRESSION Soft tissue ulcer or wound along the posteromedial aspect of the hindfoot at the level of the posterior calcaneus with interval debridement of the hindfoot soft tissues. Interval posterior calcaneal resection with release of the calcaneal attachment of the plantar fascia and Achilles tendon. No evidence of osteomyelitis or abscess. Electronically Signed   By: Elige Ko   On: 08/27/2019 07:20   DG Chest Port 1 View  Result Date: 08/26/2019 CLINICAL DATA:  Possible sepsis. EXAM: PORTABLE CHEST 1 VIEW COMPARISON:  October 13, 2016. FINDINGS: The heart size and mediastinal contours are within normal limits. Both lungs are clear. The visualized skeletal structures are unremarkable. IMPRESSION: No active disease. Electronically Signed   By: Lupita Raider M.D.   On: 08/26/2019 12:13   VAS [place] LOWER EXTREMITY VENOUS (DVT)  Result Date: 08/26/2019  Lower Venous DVTStudy Indications: Swelling.  Risk Factors: Surgery. Limitations: Body habitus, poor ultrasound/tissue interface, patient pain tolerance, patient movement and open wound. Comparison Study: No prior studies. Performing Technologist: Chanda Busing RVT  Examination Guidelines: A complete evaluation includes B-mode imaging, spectral Doppler, color Doppler, and power Doppler as needed of all accessible portions of each vessel. Bilateral testing is considered an integral part of a complete examination. Limited examinations for reoccurring indications may be performed as noted. The reflux portion of the exam is performed with the patient in  reverse Trendelenburg.  +---------+---------------+---------+-----------+----------+--------------+ RIGHT    CompressibilityPhasicitySpontaneityPropertiesThrombus Aging +---------+---------------+---------+-----------+----------+--------------+ CFV      Full           Yes      Yes                                 +---------+---------------+---------+-----------+----------+--------------+ SFJ      Full                                                        +---------+---------------+---------+-----------+----------+--------------+ FV Prox  Full                                                        +---------+---------------+---------+-----------+----------+--------------+  FV Mid   Full                                                        +---------+---------------+---------+-----------+----------+--------------+ FV DistalFull                                                        +---------+---------------+---------+-----------+----------+--------------+ PFV      Full                                                        +---------+---------------+---------+-----------+----------+--------------+ POP      Full           Yes      Yes                                 +---------+---------------+---------+-----------+----------+--------------+ PTV      Full                                                        +---------+---------------+---------+-----------+----------+--------------+ PERO                                                  Not visualized +---------+---------------+---------+-----------+----------+--------------+ Multiphasic flow is noted in the popliteal, posterior tbial, and anterior tibial arteries.  +----+---------------+---------+-----------+----------+--------------+ LEFTCompressibilityPhasicitySpontaneityPropertiesThrombus Aging +----+---------------+---------+-----------+----------+--------------+ CFV Full            Yes      Yes                                 +----+---------------+---------+-----------+----------+--------------+     Summary: RIGHT: - There is no evidence of deep vein thrombosis in the lower extremity. However, portions of this examination were limited- see technologist comments above.  - No cystic structure found in the popliteal fossa.  LEFT: - No evidence of common femoral vein obstruction.  *See table(s) above for measurements and observations. Electronically signed by Gretta Began MD on 08/26/2019 at 4:22:37 PM.    Final    - pertinent xrays, CT, MRI studies were reviewed and independently interpreted  Positive ROS: All other systems have been reviewed and were otherwise negative with the exception of those mentioned in the HPI and as above.  Physical Exam: General: No acute distress Cardiovascular: No pedal edema Respiratory: No cyanosis, no use of accessory musculature GI: No organomegaly, abdomen is soft and non-tender Skin: No lesions in the area of chief complaint Neurologic: Sensation intact distally Psychiatric: Patient is at baseline mood and affect Lymphatic: No axillary or cervical lymphadenopathy  MUSCULOSKELETAL:  - dehiscence of surgical wound -  foul smelling odor - necrosis of plantar skin flap  Assessment: Right foot infection  Plan: - needs repeat I&D and VAC - discussed with patient he could require BKA in the future if foot is not salvageable - continue vanc and zosyn - continue NPO - consent obtained  Thank you for the consult and the opportunity to seTreshaun Carrico Arellano  N. Glee Arvin, MD [facility] 7:52 AM

## 2019-08-27 NOTE — Plan of Care (Signed)
  Problem: Clinical Measurements: Goal: Diagnostic test results will improve Outcome: Progressing   Problem: Activity: Goal: Risk for activity intolerance will decrease Outcome: Progressing   

## 2019-08-27 NOTE — Anesthesia Procedure Notes (Signed)
Procedure Name: Intubation Date/Time: 08/27/2019 3:15 PM Performed by: Barrington Ellison, CRNA Pre-anesthesia Checklist: Patient identified, Emergency Drugs available, Suction available and Patient being monitored Patient Re-evaluated:Patient Re-evaluated prior to induction Oxygen Delivery Method: Circle System Utilized Preoxygenation: Pre-oxygenation with 100% oxygen Induction Type: IV induction Ventilation: Mask ventilation without difficulty Laryngoscope Size: Mac and 4 Grade View: Grade II Tube type: Oral Tube size: 7.5 mm Number of attempts: 1 Airway Equipment and Method: Stylet and Oral airway Placement Confirmation: ETT inserted through vocal cords under direct vision,  positive ETCO2 and breath sounds checked- equal and bilateral Secured at: 23 cm Tube secured with: Tape Dental Injury: Teeth and Oropharynx as per pre-operative assessment

## 2019-08-27 NOTE — Progress Notes (Signed)
PROGRESS NOTE    PHI AVANS  RWE:[id number] DOB: 1960-09-06 DOA: 08/26/2019 PCP: [name], MD   Brief Narrative:  [name] is a 59 y.o. 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 on outpatient basis and discharged home the same day presented back to ED with the complaint of worsening pain and swelling of the right heel.  According to patient, he was discharged home with no antibiotics however wound VAC was attached.  Wound VAC was dislodged on Sunday night by accident.  He followed up with Dr. [name] office on Monday and was seen by PA and was told that his wound looked better.  Subsequently, yesterday he received a call from Dr. [name] PA that his culture from the abscess is growing bacteria so he was prescribed Omnicef which he took last night and this morning however he started having worsening redness, pain and swelling of the right foot along with chills fever and sweating yesterday which persisted today so he came to the emergency department.  T-max was 102.  Other than that he does not have any other complaint. In ED: patient had some tachypnea as well as tachycardia.  Has significant leukocytosis with white cells of 26.6 as compared to normal last week.  He was diagnosed with severe sepsis based on tachycardia, tachypnea, leukocytosis and lactic acid of 2.2.  He received some antibiotics in the ED.  Orthopedic on call/Dr. Blaine Hamper was consulted and admission was deferred to hospital service. 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  -MRI shows soft tissue ulcer without evidence of osteomyelitis or abscess -Orthopedic surgery following in consult, appreciate insight and recommendations, defer potential need for repeat surgery, or further imaging  - Continue IV fluids, Zosyn and vancomycin for antibiotic coverage -Continue to follow cultures, initial culture from abscess growing gram-positive cocci and gram-negative rods; blood cultures pending  Essential hypertension:  -Home medications include lisinopril, hydrochlorothiazide and verapamil.   -Continue verapamil only, will restart other medications as indicated  Hyponatremia, chronic:  -Baseline around 130, currently within normal limits -Continue to hold HCTZ  Type 2 diabetes mellitus with polyneuropathy, borderline control:  Hold home medications, continue sliding scale insulin, hypoglycemic protocol. Discussed need for medication and dietary compliance at length at bedside Lab Results  Component Value Date   HGBA1C 6.6 (H) 08/26/2019     DVT prophylaxis: enoxaparin 40 daily Code Status: Full code Family Communication: None present  Status is: Inpatient  Dispo: The patient is from: Home              Anticipated d/c is to: To be determined              Anticipated d/c date is: 72 to 96 hours pending clinical course              Patient currently not medically stable  for discharge event ongoing need for IV antibiotics, possible need for further imaging or surgical intervention  Consultants:   Orthopedic surgery  Procedures:   None planned  Antimicrobials:  Vancomycin, Zosyn  Subjective: No acute issues or events overnight, pain well controlled, nausea, vomiting,  diarrhea, constipation, headache, fevers, chills.  Objective: Vitals:   08/26/19 1546 08/26/19 2006 08/26/19 2352 08/27/19 0447  BP: 135/73 118/65 124/64 (!) 115/54  Pulse: 94 88 77 70  Resp: _0 Temp: 98.6 F (37 C) 97.7 F (36.5 C) 99.2 F (37.3 C) 99 F (37.2 C)  TempSrc: Oral Oral Oral Oral  SpO2: 100% 97% 96% 94%  Weight:      Height:        Intake/Output Summary (Last 24 hours) at 08/27/2019 0807 Last data filed at 08/26/2019 1830 Gross per 24 hour  Intake 4821.86 ml  Output --  Net 4821.86 ml   Filed Weights   08/26/19 0942 08/26/19 1527  Weight: 117.9 kg 119.9 kg    Examination:  General:  Pleasantly resting in bed, No acute distress. HEENT:  Normocephalic atraumatic.  Sclerae nonicteric, noninjected.  Extraocular movements intact bilaterally. Neck:  Without mass or deformity.  Trachea is midline. Lungs:  Clear to auscultate bilaterally without rhonchi, wheeze, or rales. Heart:  Regular rate and rhythm.  Without murmurs, rubs, or gallops. Abdomen:  Soft, nontender, nondistended.  Without guarding or rebound. Extremities: Right heel calcaneal blanching erythema, visible postop healing incisions Vascular:  Dorsalis pedis and posterior tibial pulses palpable bilaterally. Skin:  Warm and dry, no erythema, no ulcerations.    Data Reviewed: I have personally reviewed following labs and imaging studies  CBC: Recent Labs  Lab 08/26/19 1001 08/27/19 0455  WBC 26.6* 21.5*  NEUTROABS 23.9*  --   HGB 14.3 11.6*  HCT 42.2 35.0*  MCV 91.9 92.1  PLT 267 825   Basic Metabolic Panel: Recent Labs  Lab 08/20/19 1422 08/26/19 1001 08/26/19 1537 08/27/19 0455  NA 130* 126*  --  130*  K 4.3 4.0  --  3.8  CL 96* 95*  --  99  CO2 21* 19*  --  19*  GLUCOSE 163* 132*  --  95  BUN 15 15  --  18  CREATININE 0.97 0.96  --  0.89  CALCIUM 8.9 8.8*  --  8.4*  MG  --   --  1.8  --    GFR: Estimated Creatinine Clearance: 123.2 mL/min (by C-G formula based on  SCr of 0.89 mg/dL). Liver Function Tests: Recent Labs  Lab 08/20/19 1422 08/26/19 1001  AST 39 79*  ALT 48* 67*  ALKPHOS 62 90  BILITOT 1.1 1.1  PROT 7.2 6.9  ALBUMIN 2.8* 2.0*   No results for input(s): LIPASE, AMYLASE in the last 168 hours. No results for input(s): AMMONIA in the last 168 hours. Coagulation Profile: Recent Labs  Lab 08/26/19 1237 08/27/19 0455  INR 1.3* 1.4*   Cardiac Enzymes: No results for input(s): CKTOTAL, CKMB, CKMBINDEX, TROPONINI in the last 168 hours. BNP (last 3 results) No results for input(s): PROBNP in the last 8760 hours. HbA1C: Recent Labs    08/26/19 1537  HGBA1C 6.6*   CBG: Recent Labs  Lab 08/20/19 1312 08/20/19 1712 08/26/19 1648 08/26/19 2008 08/27/19 0634  GLUCAP 218* 136* 153* 100* 98   Lipid Profile: No results for input(s): CHOL, HDL, LDLCALC, TRIG, CHOLHDL, LDLDIRECT in the last 72 hours. Thyroid Function Tests: No results for input(s): TSH, T4TOTAL,  FREET4, T3FREE, THYROIDAB in the last 72 hours. Anemia Panel: No results for input(s): VITAMINB12, FOLATE, FERRITIN, TIBC, IRON, RETICCTPCT in the last 72 hours. Sepsis Labs: Recent Labs  Lab 08/26/19 1001 08/26/19 1237 08/26/19 1627 08/26/19 1925 08/27/19 0455  PROCALCITON  --   --   --   --  2.67  LATICACIDVEN 2.3* 2.8* 3.3* 1.7  --     Recent Results (from the past 240 hour(s))  SARS Coronavirus 2 by RT PCR     Status: None   Collection Time: 08/20/19 12:38 PM  Result Value Ref Range Status   SARS Coronavirus 2 NEGATIVE NEGATIVE Final    Comment: (NOTE) Result indicates the ABSENCE of SARS-CoV-2 RNA in the patient specimen.   The lowest concentration of SARS-CoV-2 viral copies this assay can detect in nasopharyngeal swab specimens is 500 copies / mL.  A negative result does not preclude SARS-CoV-2 infection and should not be used as the sole basis for patient management decisions. A negative result may occur with improper specimen collection  / handling, submission of a specimen other than nasopharyngeal swab, presence of viral mutation(s) within the areas targeted by this assay, and inadequate number of viral copies (<500 copies / mL) present.  Negative results must be combined with clinical observations, patient history, and epidemiological information.   The expected result is NEGATIVE.   Patient Fact Sheet:  BlogSelections.co.uk    Provider Fact Sheet:  https://lucas.com/    This test is not yet approved or cleared by the [place] FDA and  has been [place] orized for detection and/or diagnosis of SARS-CoV-2 by FDA under an Emergency Use Authorization (EUA).  This EUA will remain in effect (meaning this test can be used) for the duration of  the COVID-19 declaration under Section 564(b)(1) of the Act, 21 U.S.C. section 360bbb-3(b)(1), unless the authorization is terminated or revoked sooner  Performed at [facility], [place] [address], [place], [place] [address]   Aerobic/Anaerobic Culture (surgical/deep wound)     Status: None   Collection Time: 08/20/19  4:47 PM   Specimen: Wound  Result Value Ref Range Status   Specimen Description TISSUE  Final   Special Requests RIGHT FOOT/HEEL WOUND SPEC A  Final   Gram Stain   Final    FEW WBC PRESENT,BOTH PMN AND MONONUCLEAR ABUNDANT GRAM NEGATIVE RODS ABUNDANT GRAM POSITIVE COCCI IN PAIRS RARE GRAM POSITIVE RODS    Culture   Final    FEW STREPTOCOCCUS ANGINOSIS ABUNDANT BACTEROIDES FRAGILIS BETA LACTAMASE POSITIVE Performed at [facility], [place] [address] [place], [place] [address]    Report Status 08/26/2019 FINAL  Final   Organism ID, Bacteria STREPTOCOCCUS ANGINOSIS  Final      Susceptibility   Streptococcus anginosis - MIC*    PENICILLIN INTERMEDIATE Intermediate     CEFTRIAXONE 0.5 SENSITIVE Sensitive     ERYTHROMYCIN 2 RESISTANT Resistant     LEVOFLOXACIN 2 SENSITIVE Sensitive     VANCOMYCIN 0.5  SENSITIVE Sensitive     * FEW STREPTOCOCCUS ANGINOSIS  SARS Coronavirus 2 by RT PCR (hospital order, performed in [place] hospital lab) Nasopharyngeal Nasopharyngeal Swab     Status: None   Collection Time: 08/26/19  1:13 PM   Specimen: Nasopharyngeal Swab  Result Value Ref Range Status   SARS Coronavirus 2 NEGATIVE NEGATIVE Final    Comment: (NOTE) SARS-CoV-2 target nucleic acids are NOT DETECTED.  The SARS-CoV-2 RNA is generally detectable in upper and lower respiratory specimens during  the acute phase of infection. The lowest concentration of SARS-CoV-2 viral copies this assay can detect is 250 copies / mL. A negative result does not preclude SARS-CoV-2 infection and should not be used as the sole basis for treatment or other patient management decisions.  A negative result may occur with improper specimen collection / handling, submission of specimen other than nasopharyngeal swab, presence of viral mutation(s) within the areas targeted by this assay, and inadequate number of viral copies (<250 copies / mL). A negative result must be combined with clinical observations, patient history, and epidemiological information.  Fact Sheet for Patients:   StrictlyIdeas.no  Fact Sheet for Healthcare Providers: BankingDealers.co.za  This test is not yet approved or  cleared by the [place] FDA and has been authorized for detection and/or diagnosis of SARS-CoV-2 by FDA under an Emergency Use Authorization (EUA).  This EUA will remain in effect (meaning this test can be used) for the duration of the COVID-19 declaration under Section 564(b)(1) of the Act, 21 U.S.C. section 360bbb-3(b)(1), unless the authorization is terminated or revoked sooner.  Performed at [facility], [place] [address], [place], [place] [address]          Radiology Studies: MR ANKLE RIGHT W WO CONTRAST  Result Date: 08/27/2019 CLINICAL DATA:  History  of osteomyelitis and abscess of the right foot requiring debridement. EXAM: MRI OF THE RIGHT ANKLE WITHOUT AND WITH CONTRAST TECHNIQUE: Multiplanar, multisequence MR imaging of the ankle was performed before and after the administration of intravenous contrast. CONTRAST:  54m GADAVIST GADOBUTROL 1 MMOL/ML IV SOLN COMPARISON:  08/08/2019 FINDINGS: Susceptibility artifact resulting from the orthopedic hardware in the distal tibia partially obscures the adjacent soft tissue and osseous structures especially at the level of the ankle joint. TENDONS Peroneal: Peroneal longus tendon intact. Peroneal brevis intact. Posteromedial: Posterior tibial tendon intact. Flexor hallucis longus tendon intact. Flexor digitorum longus tendon intact. Anterior: Tibialis anterior tendon intact. Extensor hallucis longus tendon intact Extensor digitorum longus tendon intact. Achilles: Interval resection of the calcaneal attachment of the Achilles tendon. Plantar Fascia: Interval resection of the calcaneal attachment of the plantar fascia. LIGAMENTS Lateral: Obscured by susceptibility artifact resulting from the distal tibial intramedullary nail. Medial: Obscured by susceptibility artifact resulting from the distal tibial intramedullary nail. CARTILAGE Ankle Joint: Susceptibility artifact resulting from the orthopedic hardware in the distal tibia partially obscures the adjacent soft tissue and osseous structures especially at the level of the ankle joint. Subtalar Joints/Sinus Tarsi: Normal subtalar joints. No subtalar joint effusion. Normal sinus tarsi. Bones/Soft Tissue: Soft tissue ulcer or wound along the posteromedial aspect of the hindfoot at the level of the posterior calcaneus. Interval debridement of the hindfoot soft tissues. Interval posterior calcaneal resection with release of the calcaneal attachment of the plantar fascia and Achilles tendon. No significant marrow signal abnormality. No periosteal reaction or bone destruction.  Osteoarthritis of the talonavicular joint. Small amount of fluid along the flexor digitorum tendons at the level of the midfoot which may reflect a ganglion cyst or tenosynovial fluid. No surrounding inflammatory changes or enhancement to suggest an abscess. IMPRESSION: IMPRESSION Soft tissue ulcer or wound along the posteromedial aspect of the hindfoot at the level of the posterior calcaneus with interval debridement of the hindfoot soft tissues. Interval posterior calcaneal resection with release of the calcaneal attachment of the plantar fascia and Achilles tendon. No evidence of osteomyelitis or abscess. Electronically Signed   By: HKathreen Devoid  On: 08/27/2019 07:20   DG Chest  Port 1 View  Result Date: 08/26/2019 CLINICAL DATA:  Possible sepsis. EXAM: PORTABLE CHEST 1 VIEW COMPARISON:  October 13, 2016. FINDINGS: The heart size and mediastinal contours are within normal limits. Both lungs are clear. The visualized skeletal structures are unremarkable. IMPRESSION: No active disease. Electronically Signed   By: Marijo Conception M.D.   On: 08/26/2019 12:13   VAS [place] LOWER EXTREMITY VENOUS (DVT)  Result Date: 08/26/2019  Lower Venous DVTStudy Indications: Swelling.  Risk Factors: Surgery. Limitations: Body habitus, poor ultrasound/tissue interface, patient pain tolerance, patient movement and open wound. Comparison Study: No prior studies. Performing Technologist: Oliver Hum RVT  Examination Guidelines: A complete evaluation includes B-mode imaging, spectral Doppler, color Doppler, and power Doppler as needed of all accessible portions of each vessel. Bilateral testing is considered an integral part of a complete examination. Limited examinations for reoccurring indications may be performed as noted. The reflux portion of the exam is performed with the patient in reverse Trendelenburg.  +---------+---------------+---------+-----------+----------+--------------+ RIGHT     CompressibilityPhasicitySpontaneityPropertiesThrombus Aging +---------+---------------+---------+-----------+----------+--------------+ CFV      Full           Yes      Yes                                 +---------+---------------+---------+-----------+----------+--------------+ SFJ      Full                                                        +---------+---------------+---------+-----------+----------+--------------+ FV Prox  Full                                                        +---------+---------------+---------+-----------+----------+--------------+ FV Mid   Full                                                        +---------+---------------+---------+-----------+----------+--------------+ FV DistalFull                                                        +---------+---------------+---------+-----------+----------+--------------+ PFV      Full                                                        +---------+---------------+---------+-----------+----------+--------------+ POP      Full           Yes      Yes                                 +---------+---------------+---------+-----------+----------+--------------+ PTV  Full                                                        +---------+---------------+---------+-----------+----------+--------------+ PERO                                                  Not visualized +---------+---------------+---------+-----------+----------+--------------+ Multiphasic flow is noted in the popliteal, posterior tbial, and anterior tibial arteries.  +----+---------------+---------+-----------+----------+--------------+ LEFTCompressibilityPhasicitySpontaneityPropertiesThrombus Aging +----+---------------+---------+-----------+----------+--------------+ CFV Full           Yes      Yes                                  +----+---------------+---------+-----------+----------+--------------+     Summary: RIGHT: - There is no evidence of deep vein thrombosis in the lower extremity. However, portions of this examination were limited- see technologist comments above.  - No cystic structure found in the popliteal fossa.  LEFT: - No evidence of common femoral vein obstruction.  *See table(s) above for measurements and observations. Electronically signed by Curt Jews MD on 08/26/2019 at 4:22:37 PM.    Final         Scheduled Meds: . aspirin EC  81 mg Oral Daily  . citalopram  20 mg Oral Daily  . insulin aspart  0-15 Units Subcutaneous TID WC  . insulin aspart  0-5 Units Subcutaneous QHS  . sodium chloride flush  3 mL Intravenous Once  . sodium chloride flush  3 mL Intravenous Q12H  . verapamil  240 mg Oral Q breakfast   Continuous Infusions: . sodium chloride 125 mL/hr at 08/26/19 1750  . piperacillin-tazobactam (ZOSYN)  IV 3.375 g (08/27/19 0531)  . vancomycin 750 mg (08/27/19 0140)     LOS: 1 day   Time spent: 46 min  Little Ishikawa, DO [facility]  If 7PM-7AM, please contact night-coverage [email or web address]  08/27/2019, 8:07 AM

## 2019-08-27 NOTE — Progress Notes (Signed)
On call phys had been notified of last lactic acidresult

## 2019-08-27 NOTE — Transfer of Care (Signed)
Immediate Anesthesia Transfer of Care Note  Patient: [name]  Procedure(s) Performed: IRRIGATION AND DEBRIDEMENT  OF FOOT (Right )  Patient Location: PACU  Anesthesia Type:General  Level of Consciousness: awake and oriented  Airway & Oxygen Therapy: Patient Spontanous Breathing and Patient connected to face mask oxygen  Post-op Assessment: Report given to RN  Post vital signs: Reviewed and stable  Last Vitals:  Vitals Value Taken Time  BP 117/50 08/27/19 1622  Temp    Pulse 85 08/27/19 1623  Resp 36 08/27/19 1623  SpO2 97 % 08/27/19 1623  Vitals shown include unvalidated device data.  Last Pain:  Vitals:   08/27/19 1354  TempSrc:   PainSc: 7       Patients Stated Pain Goal: 3 (08/27/19 1354)  Complications: No complications documented.

## 2019-08-28 ENCOUNTER — Inpatient Hospital Stay (HOSPITAL_COMMUNITY): Payer: BC Managed Care – PPO

## 2019-08-28 DIAGNOSIS — E871 Hypo-osmolality and hyponatremia: Secondary | ICD-10-CM

## 2019-08-28 DIAGNOSIS — E1165 Type 2 diabetes mellitus with hyperglycemia: Secondary | ICD-10-CM

## 2019-08-28 DIAGNOSIS — I10 Essential (primary) hypertension: Secondary | ICD-10-CM

## 2019-08-28 LAB — GLUCOSE, CAPILLARY
Glucose-Capillary: 120 mg/dL — ABNORMAL HIGH (ref 70–99)
Glucose-Capillary: 131 mg/dL — ABNORMAL HIGH (ref 70–99)
Glucose-Capillary: 147 mg/dL — ABNORMAL HIGH (ref 70–99)
Glucose-Capillary: 153 mg/dL — ABNORMAL HIGH (ref 70–99)
Glucose-Capillary: 129 mg/dL — ABNORMAL HIGH (ref 70–99)

## 2019-08-28 LAB — BASIC METABOLIC PANEL
Anion gap: 9 (ref 5–15)
BUN: 23 mg/dL — ABNORMAL HIGH (ref 6–20)
CO2: 20 mmol/L — ABNORMAL LOW (ref 22–32)
Calcium: 8.1 mg/dL — ABNORMAL LOW (ref 8.9–10.3)
Chloride: 103 mmol/L (ref 98–111)
Creatinine, Ser: 0.83 mg/dL (ref 0.61–1.24)
GFR calc Af Amer: >60 mL/min (ref >60)
GFR calc non Af Amer: >60 mL/min (ref >60)
Glucose, Bld: 97 mg/dL (ref 70–99)
Potassium: 4.6 mmol/L (ref 3.5–5.1)
Sodium: 132 mmol/L — ABNORMAL LOW (ref 135–145)

## 2019-08-28 MED ORDER — SODIUM CHLORIDE 0.9 % IV SOLN
2.0000 g | INTRAVENOUS | Status: DC
  Administered 2019-08-28 – 2019-09-02 (×6): 2 g via INTRAVENOUS
  Filled 2019-08-28 (×6): qty 20

## 2019-08-28 MED ORDER — METRONIDAZOLE IN NACL 5-0.79 MG/ML-% IV SOLN
500.0000 mg | Freq: Three times a day (TID) | INTRAVENOUS | Status: DC
  Administered 2019-08-28 – 2019-09-03 (×18): 500 mg via INTRAVENOUS
  Filled 2019-08-28 (×18): qty 100

## 2019-08-28 NOTE — Evaluation (Signed)
Physical Therapy Evaluation Patient Details Name: [name] MRN: [id number] DOB: [birth year] Today's Date: 08/28/2019   History of Present Illness  Pt is a 59 yr old male who had increase in edema in R heel and and wound vac was dislodged. Pt s/p Syme amputation of right lower extremity.  PMH but not limited to:  hypertension, type 2 diabetes mellitus multiple other comorbidities including diagnosis of osteomyelitis of right calcaneus and abscess of the right calcaneus is status post incision and drainage done on 08/20/2019  Clinical Impression  Patient required mod a for transfers and max a to move towards the head of the bed in standing. He became fatigued quickly. He hopes to return home but will require significant assist and needs to be able to get up 1 step. At this time CIR may the best course to get him home safely. Skilled acute PT will continue to follow the patient while he is in the hospital.     Follow Up Recommendations CIR    Equipment Recommendations  None recommended by PT    Recommendations for Other Services Rehab consult     Precautions / Restrictions Precautions Precautions: Fall Restrictions Weight Bearing Restrictions: Yes RLE Weight Bearing: Non weight bearing      Mobility  Bed Mobility Overal bed mobility: Needs Assistance Bed Mobility: Supine to Sit;Sit to Supine     Supine to sit: Min guard Sit to supine: Min guard;HOB elevated   General bed mobility comments: cues on position  Transfers Overall transfer level: Needs assistance Equipment used: Rolling walker (2 wheeled) Transfers: Sit to/from Stand Sit to Stand: Mod assist;From elevated surface;Max assist (from bed )         General transfer comment: sit to stand 2x with therapy. 1st trial max a assist patient unable to xcome to full standing. On the second trial he required mod a and was able to stand   Ambulation/Gait Ambulation/Gait assistance: Max assist Gait Distance (Feet): 1  Feet   Gait Pattern/deviations: Shuffle Gait velocity: decreased    General Gait Details: patient attmepted to shuffle his foot up the head of the bed but became fatigued and had to sit   Stairs            Wheelchair Mobility    Modified Rankin (Stroke Patients Only)       Balance Overall balance assessment: Needs assistance   Sitting balance-Leahy Scale: Fair     Standing balance support: Bilateral upper extremity supported Standing balance-Leahy Scale: Poor                               Pertinent Vitals/Pain Pain Assessment: 0-10 Pain Location: RLE Pain Descriptors / Indicators: Aching;Discomfort Pain Intervention(s): Limited activity within patient's tolerance;Monitored during session;Repositioned    Home Living Family/patient expects to be discharged to:: Private residence Living Arrangements: Spouse/significant other Available Help at Discharge: Family (wife and reports they willbe able to suport pt ) Type of Home: House Home Access: Stairs to enter Entrance Stairs-Rails: None Secretary/administrator of Steps: 1 Home Layout: One level Home Equipment: None Additional Comments: grab bar in shower and hand held shower     Prior Function Level of Independence: Needs assistance   Gait / Transfers Assistance Needed: per pt "not really walking in awhile"            Hand Dominance   Dominant Hand: Left    Extremity/Trunk Assessment   Upper Extremity  Assessment LUE Deficits / Details: per pt injured LUE in october  LUE Sensation: WNL LUE Coordination: WNL    Lower Extremity Assessment Lower Extremity Assessment: RLE deficits/detail RLE: Unable to fully assess due to pain;Unable to fully assess due to immobilization    Cervical / Trunk Assessment Cervical / Trunk Assessment: Normal  Communication   Communication: No difficulties  Cognition Arousal/Alertness: Awake/alert Behavior During Therapy: WFL for tasks  assessed/performed Overall Cognitive Status: Within Functional Limits for tasks assessed                                        General Comments General comments (skin integrity, edema, etc.): wound vac on right foot     Exercises     Assessment/Plan    PT Assessment Patient needs continued PT services  PT Problem List Decreased strength;Decreased range of motion;Decreased activity tolerance;Decreased balance;Decreased mobility;Decreased safety awareness;Decreased knowledge of use of DME;Cardiopulmonary status limiting activity;Pain       PT Treatment Interventions DME instruction;Gait training;Stair training;Functional mobility training;Therapeutic activities;Therapeutic exercise;Balance training;Neuromuscular re-education;Patient/family education    PT Goals (Current goals can be found in the Care Plan section)  Acute Rehab PT Goals Patient Stated Goal: to continue to move  PT Goal Formulation: With patient    Frequency Min 3X/week   Barriers to discharge        Co-evaluation               AM-PAC PT "6 Clicks" Mobility  Outcome Measure                  End of Session Equipment Utilized During Treatment: Gait belt Activity Tolerance: Patient tolerated treatment well     PT Visit Diagnosis: Other abnormalities of gait and mobility (R26.89)    Time: 1400-1420 PT Time Calculation (min) (ACUTE ONLY): 20 min   Charges:   PT Evaluation $PT Eval Moderate Complexity: 1 Mod            Dessie Coma PT DPT  08/28/2019, 2:23 PM

## 2019-08-28 NOTE — Progress Notes (Signed)
Pharmacy Antibiotic Note  [name] is a 59 y.o. male admitted on 08/26/2019 with sepsis. Patient received one dose of ceftriaxone in the ED and has received Zosyn and vancomycin since 8/5 for R heel abscess. Patient underwent I&D during prior admission on 7/30 and culture grew ceftriaxone-sensitive (penicillin-intermediate) Strep anginosis and abundant Bacteroides fragilis. Initial plan was for repeat I&D yesterday; however, ortho ultimately performed below ankle amputation. Team is discussing potential for BKA amputation next week. Given amputation and final culture results, will deescalate to appropriate therapy.  Plan: - Discontinue Zosyn and vancomycin - Initiate ceftriaxone 2g daily - Initiate metronidazole 500mg  q8h - No renal adjustments necessary - Monitor for clinical progression and length of therapy  Height: 6\' 3"  (190.5 cm) Weight: 119.9 kg (264 lb 5.3 oz) IBW/kg (Calculated) : 84.5  Temp (24hrs), Avg:97.9 F (36.6 C), Min:97.4 F (36.3 C), Max:98.7 F (37.1 C)  Recent Labs  Lab 08/26/19 1001 08/26/19 1237 08/26/19 1627 08/26/19 1925 08/27/19 0455 08/28/19 0935  WBC 26.6*  --   --   --  21.5* 16.7*  CREATININE 0.96  --   --   --  0.89 0.83  LATICACIDVEN 2.3* 2.8* 3.3* 1.7  --   --     Estimated Creatinine Clearance: 132.1 mL/min (by C-G formula based on SCr of 0.83 mg/dL).    Allergies  Allergen Reactions   Invokamet [Canagliflozin-Metformin Hcl] Other (See Comments)    Extremity edema/caused pain   Invokana [Canagliflozin] Other (See Comments)    Extremity edema/caused pain   Codeine Camsylate [Codeine] Rash   Morphine And Related Rash    Antimicrobials this admission: Vanc 8/5>>8/7 Zosyn 8/5>>8/7 CTX x 1 8/5, 8/7>> Metronidazole 8/7>>  Microbiological results 8/5 BCx; ngtd 8/5 UCx: ngtd Last admission: 7/30 wound Cx: strep anginosis (CTX sens) + abundant bacteroides fragilis  Thank you for allowing pharmacy to be a part of this patients  care.  10/5, PharmD PGY2 ID Pharmacy Resident Phone between 7 am - 3:30 pm: 8/30  Please check AMION for all [facility] phone numbers After 10:00 PM, call Main Pharmacy [phone]  Please check [email or web address] for unit-specific pharmacy phone numbers.

## 2019-08-28 NOTE — Anesthesia Postprocedure Evaluation (Signed)
Anesthesia Post Note  Patient: [name]  Procedure(s) Performed: IRRIGATION AND DEBRIDEMENT  OF FOOT (Right )     Patient location during evaluation: PACU Anesthesia Type: General Level of consciousness: awake and alert Pain management: pain level controlled Vital Signs Assessment: post-procedure vital signs reviewed and stable Respiratory status: spontaneous breathing, nonlabored ventilation and respiratory function stable Cardiovascular status: blood pressure returned to baseline and stable Postop Assessment: no apparent nausea or vomiting Anesthetic complications: no   No complications documented.  Last Vitals:  Vitals:   08/27/19 2333 08/28/19 0431  BP: (!) 90/58 (!) 100/56  Pulse: (!) 59 60  Resp: 17 18  Temp: 36.7 C 36.8 C  SpO2: 94% 97%    Last Pain:  Vitals:   08/28/19 0431  TempSrc: Oral  PainSc:                  Lowella Curb

## 2019-08-28 NOTE — Progress Notes (Addendum)
   Subjective:  Patient reports pain as mild.   Objective:   VITALS:   Vitals:   08/27/19 1925 08/27/19 2333 08/28/19 0431 08/28/19 0737  BP: 109/60 (!) 90/58 (!) 100/56 102/67  Pulse: 68 (!) 59 60 60  Resp: 16 17 18 20   Temp: 98 F (36.7 C) 98.1 F (36.7 C) 98.3 F (36.8 C) (!) 97.5 F (36.4 C)  TempSrc: Oral Oral Oral Oral  SpO2: 95% 94% 97% 96%  Weight:      Height:        VAC intact with mild bloody drainage in canister   Lab Results  Component Value Date   WBC 21.5 (H) 08/27/2019   HGB 11.6 (L) 08/27/2019   HCT 35.0 (L) 08/27/2019   MCV 92.1 08/27/2019   PLT 211 08/27/2019     Assessment/Plan:  1 Day Post-Op   - elevate RLE - NWB - up with PT - will need repeat I&D and likely BKA next Wednesday by Dr. Saturday - continue IV abx for now and allow tissues to demarcate - patient understands that his foot was unsalvageable  Lajoyce Corners 08/28/2019, 9:30 AM [phone]

## 2019-08-28 NOTE — Evaluation (Signed)
Occupational Therapy Evaluation Patient Details Name: [name] MRN: [id number] DOB: 02/27/1960 Today's Date: 08/28/2019    History of Present Illness Pt is a 59 yr old male who had increase in edema in R heel and and wound vac was dislodged. Pt s/p Syme amputation of right lower extremity.  PMH but not limited to:  hypertension, type 2 diabetes mellitus multiple other comorbidities including diagnosis of osteomyelitis of right calcaneus and abscess of the right calcaneus is status post incision and drainage done on 08/20/2019   Clinical Impression   Pt admitted with edema in RLE. Pt currently with functional limitations due to the deficits listed below (see OT Problem List). Pt at this time may have further sx. Pt at this time was able to complete supine to sit with HOB elevated with  Min guard, sit to stand to RW from elevated surface with min assist and was able to slide LLE up to head of bed. Pt max standing at this time about 3 ins then became SOB. Pt at this time set up at EOB for eating and hygiene. Pt requires min for UE dressing and max assist at EOB dressing. Pt will benefit from skilled OT to increase their safety and independence with ADL and functional mobility for ADL to facilitate discharge to venue listed below.       Follow Up Recommendations  CIR (depending on possible sx)    Equipment Recommendations  3 in 1 bedside commode;Tub/shower bench    Recommendations for Other Services       Precautions / Restrictions Precautions Precautions: Fall Restrictions Weight Bearing Restrictions: Yes RLE Weight Bearing: Non weight bearing      Mobility Bed Mobility Overal bed mobility: Needs Assistance Bed Mobility: Supine to Sit;Sit to Supine     Supine to sit: Min guard Sit to supine: Min guard;HOB elevated   General bed mobility comments: cues on position  Transfers Overall transfer level: Needs assistance Equipment used: Rolling walker (2 wheeled) Transfers:  Sit to/from Stand Sit to Stand: Min assist;From elevated surface (from bed )         General transfer comment: due to height from bed level     Balance Overall balance assessment: Needs assistance Sitting-balance support:  (single LE ) Sitting balance-Leahy Scale: Fair                                     ADL either performed or assessed with clinical judgement   ADL Overall ADL's : Needs assistance/impaired Eating/Feeding: Modified independent;Sitting   Grooming: Wash/dry hands;Wash/dry face;Sitting;Set up   Upper Body Bathing: Cueing for safety;Cueing for sequencing;Sitting;Bed level;Minimal assistance (sitting at EOB )   Lower Body Bathing: Maximal assistance;Cueing for safety;Cueing for sequencing;Sit to/from stand   Upper Body Dressing : Minimal assistance;Sitting;Cueing for safety;Cueing for sequencing   Lower Body Dressing: Maximal assistance;Cueing for safety;Cueing for sequencing;Sit to/from stand               Functional mobility during ADLs: Rolling walker (pt able to slide LLE when in standing with BUE support on RW)       Vision Baseline Vision/History: No visual deficits Patient Visual Report: No change from baseline Vision Assessment?: No apparent visual deficits     Perception Perception Perception Tested?: No   Praxis Praxis Praxis tested?: Not tested    Pertinent Vitals/Pain Pain Assessment: 0-10 Pain Score: 3  Pain Location: RLE Pain Descriptors /  Indicators: Aching;Discomfort Pain Intervention(s): Limited activity within patient's tolerance;Monitored during session;Repositioned     Hand Dominance Left   Extremity/Trunk Assessment Upper Extremity Assessment Upper Extremity Assessment: LUE deficits/detail LUE Deficits / Details: per pt injured LUE in october  LUE Sensation: WNL LUE Coordination: WNL   Lower Extremity Assessment Lower Extremity Assessment: Defer to PT evaluation   Cervical / Trunk  Assessment Cervical / Trunk Assessment: Normal   Communication Communication Communication: No difficulties   Cognition Arousal/Alertness: Awake/alert Behavior During Therapy: WFL for tasks assessed/performed Overall Cognitive Status: Within Functional Limits for tasks assessed                                     General Comments       Exercises     Shoulder Instructions      Home Living Family/patient expects to be discharged to:: Private residence Living Arrangements: Spouse/significant other Available Help at Discharge: Family (wife and reports they willbe able to suport pt ) Type of Home: House Home Access: Stairs to enter Secretary/administrator of Steps: 1 Entrance Stairs-Rails: None Home Layout: One level     Bathroom Shower/Tub: IT trainer: Standard Bathroom Accessibility: Yes   Home Equipment: None   Additional Comments: grab bar in shower and hand held shower       Prior Functioning/Environment Level of Independence: Needs assistance  Gait / Transfers Assistance Needed: per pt "not really walking in awhile"               OT Problem List: Decreased strength;Decreased range of motion;Decreased activity tolerance;Impaired balance (sitting and/or standing);Decreased safety awareness;Impaired sensation;Pain      OT Treatment/Interventions: Self-care/ADL training;Therapeutic exercise;Energy conservation;DME and/or AE instruction;Therapeutic activities;Patient/family education;Balance training    OT Goals(Current goals can be found in the care plan section) Acute Rehab OT Goals Patient Stated Goal: to move more OT Goal Formulation: With patient Time For Goal Achievement: 09/18/19 Potential to Achieve Goals: Good  OT Frequency: Min 2X/week   Barriers to D/C:            Co-evaluation              AM-PAC OT "6 Clicks" Daily Activity     Outcome Measure Help from another person eating meals?:  None Help from another person taking care of personal grooming?: A Little Help from another person toileting, which includes using toliet, bedpan, or urinal?: A Lot Help from another person bathing (including washing, rinsing, drying)?: A Lot Help from another person to put on and taking off regular upper body clothing?: A Little Help from another person to put on and taking off regular lower body clothing?: A Lot 6 Click Score: 16   End of Session Equipment Utilized During Treatment: Gait belt;Rolling walker  Activity Tolerance: Patient tolerated treatment well;Patient limited by fatigue Patient left: in bed;with call bell/phone within reach;with bed alarm set  OT Visit Diagnosis: Unsteadiness on feet (R26.81);Other abnormalities of gait and mobility (R26.89);Repeated falls (R29.6);Pain Pain - Right/Left: Right Pain - part of body: Leg                Time: 4132-4401 OT Time Calculation (min): 36 min Charges:  OT General Charges $OT Visit: 1 Visit OT Evaluation $OT Eval Low Complexity: 1 Low OT Treatments $Self Care/Home Management : 8-22 mins  Alphia Moh OTR/L  Acute Rehab Services  [phone] office number [phone] pager number  Alphia Moh 08/28/2019, 11:29 AM

## 2019-08-28 NOTE — Progress Notes (Signed)
Inpatient Rehab Admissions Coordinator Note:   Per therapy recommendations, pt was screened for CIR candidacy by Yajayra Feldt, MS CCC-SLP. At this time, Pt. Appears to have functional decline and is a good candidate for CIR. Will place order for rehab consult per protocol.  Please contact me with questions.   Jesiah Yerby, MS, CCC-SLP Rehab Admissions Coordinator  [phone] (celll) [phone] (office)  

## 2019-08-28 NOTE — Progress Notes (Signed)
PROGRESS NOTE    [name]  KCL:[id number] DOB: 08-Jun-1960 DOA: 08/26/2019 PCP: [name], MD   Brief Narrative:  [name] a 59 y.o.malewith medical history significant ofhypertension, type 2 diabetes mellitus multiple other comorbidities including diagnosis of osteomyelitis of right calcaneus and abscess of the right calcaneus is status post incision and drainage done on 08/20/2019 by [name] on outpatient basis and discharged home the same day presented back to ED with the complaint of worsening pain and swelling of the right heel. According to patient, he was discharged home with no antibiotics however wound VAC was attached. Wound VAC was dislodged on _0 /05/21 1331        Code Status History    This patient has a current code status but no historical code status.   Advance Care Planning Activity     Family Communication: discussed with wife today  Disposition Plan:  Status is: Inpatient  Remains inpatient appropriate because:IV treatments  appropriate due to intensity of illness or inability to take PO and Inpatient level of care appropriate due to severity of illness   Dispo: The patient is from: Home              Anticipated d/c is to: unknown currently               Anticipated d/c date is: 2 days              Patient currently is not medically stable to d/c.       Consults called: None Admission status: Inpatient   Consultants:   ortho  Procedures:  MR Ankle Right w/o contrast  Result Date: 08/10/2019 CLINICAL DATA:  Right heel ulceration. Clinical concern for osteomyelitis EXAM: MRI OF THE RIGHT ANKLE WITHOUT CONTRAST TECHNIQUE: Multiplanar, multisequence MR imaging of the ankle was performed. No intravenous contrast was administered. COMPARISON:  X-ray 07/16/2019 FINDINGS: Superficial soft tissue ulceration at the posteromedial aspect of the hindfoot (series 3, images 37-39) with skin thickening and mild adjacent soft tissue edema. No underlying fluid collection. Ulceration does not extend to the underlying calcaneal cortex. TENDONS Peroneal: Intact peroneus longus and peroneus brevis tendons. Posteromedial: Intact tibialis posterior, flexor hallucis longus and flexor digitorum longus tendons. Anterior: Intact tibialis anterior, extensor hallucis longus and extensor digitorum longus tendons. Achilles: Intact. Plantar Fascia: Thickening and intermediate signal within the central and medial bands of the plantar fascia without tear or perifascial edema. LIGAMENTS Lateral: Evaluation of the lateral ankle ligaments is slightly degraded by susceptibility artifact and poor fat saturation related to patient's surgical hardware. The anterior and posterior tibiofibular ligaments are grossly intact. The anterior and posterior talofibular ligaments grossly are intact. Intact calcaneofibular ligament. Medial: Deltoid and spring ligament complex appear grossly intact. CARTILAGE Ankle Joint: Mild cartilage thinning with reactive subchondral marrow signal changes at the lateral talar shoulder. No tibiotalar joint effusion. Subtalar Joints/Sinus Tarsi: No joint effusion or chondral defect. Preservation of the anatomic fat within the sinus tarsi. Bones:  Susceptibility artifact within the distal tibia related to prior ORIF hardware. Mild arthropathy and dorsal hypertrophy within the midfoot. Small plantar calcaneal spur and small enthesophyte at the Achilles tendon insertion. No acute fracture. No marrow edema. No cortical destruction. Other: Mild intramuscular edema within the intrinsic foot musculature. No soft tissue fluid collection. IMPRESSION: 1. Superficial soft tissue ulceration at the posteromedial aspect of the right hindfoot. No underlying fluid collection or evidence of osteomyelitis. 2. Findings suggest chronic plantar fasciitis. 3. Mild intramuscular edema within the intrinsic foot musculature, which may reflect denervation changes versus myositis. 4. Mild arthropathy of the tibiotalar joint and midfoot. Electronically Signed   By: Davina Poke D.O.   On: 08/10/2019 10:26   MR ANKLE RIGHT W WO CONTRAST  Result Date: 08/27/2019 CLINICAL DATA:  History of osteomyelitis and abscess of the right foot requiring debridement. EXAM: MRI OF THE RIGHT ANKLE WITHOUT AND WITH CONTRAST TECHNIQUE: Multiplanar, multisequence MR imaging of the ankle was performed before and after the administration of intravenous contrast. CONTRAST:  58m GADAVIST GADOBUTROL 1 MMOL/ML IV SOLN COMPARISON:  08/08/2019 FINDINGS: Susceptibility artifact resulting from the orthopedic hardware in the distal tibia partially obscures the adjacent soft tissue and osseous structures especially at the level of the ankle joint. TENDONS Peroneal: Peroneal longus tendon intact. Peroneal brevis intact. Posteromedial: Posterior tibial tendon intact. Flexor hallucis longus tendon intact. Flexor digitorum longus tendon intact. Anterior: Tibialis anterior tendon intact. Extensor hallucis longus tendon intact Extensor digitorum longus tendon intact.  Achilles: Interval resection of the calcaneal attachment of the Achilles tendon. Plantar Fascia: Interval resection of the calcaneal attachment of the  plantar fascia. LIGAMENTS Lateral: Obscured by susceptibility artifact resulting from the distal tibial intramedullary nail. Medial: Obscured by susceptibility artifact resulting from the distal tibial intramedullary nail. CARTILAGE Ankle Joint: Susceptibility artifact resulting from the orthopedic hardware in the distal tibia partially obscures the adjacent soft tissue and osseous structures especially at the level of the ankle joint. Subtalar Joints/Sinus Tarsi: Normal subtalar joints. No subtalar joint effusion. Normal sinus tarsi. Bones/Soft Tissue: Soft tissue ulcer or wound along the posteromedial aspect of the hindfoot at the level of the posterior calcaneus. Interval debridement of the hindfoot soft tissues. Interval posterior calcaneal resection with release of the calcaneal attachment of the plantar fascia and Achilles tendon. No significant marrow signal abnormality. No periosteal reaction or bone destruction. Osteoarthritis of the talonavicular joint. Small amount of fluid along the flexor digitorum tendons at the level of the midfoot which may reflect a ganglion cyst or tenosynovial fluid. No surrounding inflammatory changes or enhancement to suggest an abscess. IMPRESSION: IMPRESSION Soft tissue ulcer or wound along the posteromedial aspect of the hindfoot at the level of the posterior calcaneus with interval debridement of the hindfoot soft tissues. Interval posterior calcaneal resection with release of the calcaneal attachment of the plantar fascia and Achilles tendon. No evidence of osteomyelitis or abscess. Electronically Signed   By: Kathreen Devoid   On: 08/27/2019 07:20   DG Chest Port 1 View  Result Date: 08/26/2019 CLINICAL DATA:  Possible sepsis. EXAM: PORTABLE CHEST 1 VIEW COMPARISON:  October 13, 2016. FINDINGS: The heart size and mediastinal contours are within normal limits. Both lungs are clear. The visualized skeletal structures are unremarkable. IMPRESSION: No active disease.  Electronically Signed   By: Marijo Conception M.D.   On: 08/26/2019 12:13   DG Tibia/Fibula Right Port  Result Date: 08/28/2019 CLINICAL DATA:  RIGHT foot gangrenous infection. 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 EXAM: PORTABLE RIGHT TIBIA AND FIBULA - 2 VIEW COMPARISON:  07/16/2019, 08/21/2016 FINDINGS: Intramedullary rod in the tibia with distal cortical screws, unremarkable in appearance. Chronic deformity of the distal RIGHT tibia and fibula consistent with remote injury. Interval amputation of the RIGHT foot at the tibiotalar joint. The cortical surfaces of the distal tibia and fibula are unremarkable. Postoperative skin clips remain. IMPRESSION: 1. Postoperative changes. 2. No evidence for acute abnormality. Electronically Signed   By: Nolon Nations M.D.   On: 08/28/2019 11:34   VAS [place] LOWER EXTREMITY VENOUS (DVT)  Result Date: 08/26/2019  Lower Venous DVTStudy Indications: Swelling.  Risk Factors: Surgery. Limitations: Body habitus, poor ultrasound/tissue interface, patient pain tolerance, patient movement and open wound. Comparison Study: No prior studies. Performing Technologist: Oliver Hum RVT  Examination Guidelines: A complete evaluation includes B-mode imaging, spectral Doppler, color Doppler, and power Doppler as needed of all accessible portions of each vessel. Bilateral testing is considered an integral part of a complete examination. Limited examinations for reoccurring indications may be performed as noted. The reflux portion of the exam is performed with the patient in reverse Trendelenburg.  +---------+---------------+---------+-----------+----------+--------------+ RIGHT    CompressibilityPhasicitySpontaneityPropertiesThrombus Aging +---------+---------------+---------+-----------+----------+--------------+ CFV      Full           Yes      Yes                                 +---------+---------------+---------+-----------+----------+--------------+  SFJ      Full                                                         +---------+---------------+---------+-----------+----------+--------------+ FV Prox  Full                                                        +---------+---------------+---------+-----------+----------+--------------+ FV Mid   Full                                                        +---------+---------------+---------+-----------+----------+--------------+ FV DistalFull                                                        +---------+---------------+---------+-----------+----------+--------------+ PFV      Full                                                        +---------+---------------+---------+-----------+----------+--------------+ POP      Full           Yes      Yes                                 +---------+---------------+---------+-----------+----------+--------------+ PTV      Full                                                        +---------+---------------+---------+-----------+----------+--------------+ PERO                                                  Not visualized +---------+---------------+---------+-----------+----------+--------------+ Multiphasic flow is noted in the popliteal, posterior tbial, and anterior tibial arteries.  +----+---------------+---------+-----------+----------+--------------+ LEFTCompressibilityPhasicitySpontaneityPropertiesThrombus Aging +----+---------------+---------+-----------+----------+--------------+ CFV Full           Yes      Yes                                 +----+---------------+---------+-----------+----------+--------------+     Summary: RIGHT: - There is no evidence of deep vein thrombosis in the lower extremity. However, portions of this examination were limited- see technologist comments above.  - No cystic structure found in the popliteal fossa.  LEFT: - No evidence of common femoral vein obstruction.  *  See table(s)  above for measurements and observations. Electronically signed by Curt Jews MD on 08/26/2019 at 4:22:37 PM.    Final      Antimicrobials:   Ceftriaxone and flagyl   Subjective: Pt reports doing well postop, VAC in place, pain controlled  Objective: Vitals:   08/27/19 2333 08/28/19 0431 08/28/19 0737 08/28/19 1147  BP: (!) 90/58 (!) 100/56 102/67 (!) 105/58  Pulse: (!) 59 60 60 61  Resp: _0 Temp: 98.1 F (36.7 C) 98.3 F (36.8 C) (!) 97.5 F (36.4 C) (!) 97.4 F (36.3 C)  TempSrc: Oral Oral Oral Oral  SpO2: 94% 97% 96% 96%  Weight:      Height:        Intake/Output Summary (Last 24 hours) at 08/28/2019 1445 Last data filed at 08/28/2019 0738 Gross per 24 hour  Intake 940 ml  Output 1100 ml  Net -160 ml   Filed Weights   08/26/19 0942 08/26/19 1527 08/27/19 1350  Weight: 117.9 kg 119.9 kg 119.9 kg    Examination:  General:  Pleasantly resting in bed, No acute distress. HEENT:  Normocephalic atraumatic.  Sclerae nonicteric, noninjected.  Extraocular movements intact bilaterally. Neck:  Without mass or deformity.  Trachea is midline. Lungs:  Clear to auscultate bilaterally without rhonchi, wheeze, or rales. Heart:  Regular rate and rhythm.  Without murmurs, rubs, or gallops. Abdomen:  Soft, nontender, nondistended.  Without guarding or rebound. Extremities: post surg dressing in place did not take down, wound vac in place, no evid of bleed thru. Skin:  Warm and dry, no erythema, no ulcerations, surg site not fully examined 2/2 postop dressing and wound vac    Data Reviewed: I have personally reviewed following labs and imaging studies  CBC: Recent Labs  Lab 08/26/19 1001 08/27/19 0455 08/28/19 0935  WBC 26.6* 21.5* 16.7*  NEUTROABS 23.9*  --  13.6*  HGB 14.3 11.6* 11.9*  HCT 42.2 35.0* 36.0*  MCV 91.9 92.1 94.7  PLT 267 211 956   Basic Metabolic Panel: Recent Labs  Lab 08/26/19 1001 08/26/19 1537 08/27/19 0455 08/28/19 0935  NA 126*  --   130* 132*  K 4.0  --  3.8 4.6  CL 95*  --  99 103  CO2 19*  --  19* 20*  GLUCOSE 132*  --  95 97  BUN 15  --  18 23*  CREATININE 0.96  --  0.89 0.83  CALCIUM 8.8*  --  8.4* 8.1*  MG  --  1.8  --   --    GFR: Estimated Creatinine Clearance: 132.1 mL/min (by C-G formula based on SCr of 0.83 mg/dL). Liver Function Tests: Recent Labs  Lab 08/26/19 1001  AST 79*  ALT 67*  ALKPHOS 90  BILITOT 1.1  PROT 6.9  ALBUMIN 2.0*   No results for input(s): LIPASE, AMYLASE in the last 168 hours. No results for input(s): AMMONIA in the last 168 hours. Coagulation Profile: Recent Labs  Lab 08/26/19 1237 08/27/19 0455  INR 1.3* 1.4*   Cardiac Enzymes: No results for input(s): CKTOTAL, CKMB, CKMBINDEX, TROPONINI in the last 168 hours. BNP (last 3 results) No results for input(s): PROBNP in the last 8760 hours. HbA1C: Recent Labs    08/26/19 1537  HGBA1C 6.6*   CBG: Recent Labs  Lab 08/27/19 1331 08/27/19 1622 08/27/19 1924 08/28/19 0635 08/28/19 1147  GLUCAP 87 120* 120* 131* 147*   Lipid Profile: No results for input(s): CHOL, HDL, LDLCALC, TRIG, CHOLHDL,  LDLDIRECT in the last 72 hours. Thyroid Function Tests: No results for input(s): TSH, T4TOTAL, FREET4, T3FREE, THYROIDAB in the last 72 hours. Anemia Panel: No results for input(s): VITAMINB12, FOLATE, FERRITIN, TIBC, IRON, RETICCTPCT in the last 72 hours. Sepsis Labs: Recent Labs  Lab 08/26/19 1001 08/26/19 1237 08/26/19 1627 08/26/19 1925 08/27/19 0455  PROCALCITON  --   --   --   --  2.67  LATICACIDVEN 2.3* 2.8* 3.3* 1.7  --     Recent Results (from the past 240 hour(s))  SARS Coronavirus 2 by RT PCR     Status: None   Collection Time: 08/20/19 12:38 PM  Result Value Ref Range Status   SARS Coronavirus 2 NEGATIVE NEGATIVE Final    Comment: (NOTE) Result indicates the ABSENCE of SARS-CoV-2 RNA in the patient specimen.   The lowest concentration of SARS-CoV-2 viral copies this assay can detect in  nasopharyngeal swab specimens is 500 copies / mL.  A negative result does not preclude SARS-CoV-2 infection and should not be used as the sole basis for patient management decisions. A negative result may occur with improper specimen collection / handling, submission of a specimen other than nasopharyngeal swab, presence of viral mutation(s) within the areas targeted by this assay, and inadequate number of viral copies (<500 copies / mL) present.  Negative results must be combined with clinical observations, patient history, and epidemiological information.   The expected result is NEGATIVE.   Patient Fact Sheet:  https://wong-henderson.biz/    Provider Fact Sheet:  CheapJackpot.at    This test is not yet approved or cleared by the [place] FDA and  has been [place] orized for detection and/or diagnosis of SARS-CoV-2 by FDA under an Emergency Use Authorization (EUA).  This EUA will remain in effect (meaning this test can be used) for the duration of  the COVID-19 declaration under Section 564(b)(1) of the Act, 21 U.S.C. section 360bbb-3(b)(1), unless the authorization is terminated or revoked sooner  Performed at [facility], [address] [place], [place] [address]   Aerobic/Anaerobic Culture (surgical/deep wound)     Status: None   Collection Time: 08/20/19  4:47 PM   Specimen: Wound  Result Value Ref Range Status   Specimen Description TISSUE  Final   Special Requests RIGHT FOOT/HEEL WOUND SPEC A  Final   Gram Stain   Final    FEW WBC PRESENT,BOTH PMN AND MONONUCLEAR ABUNDANT GRAM NEGATIVE RODS ABUNDANT GRAM POSITIVE COCCI IN PAIRS RARE GRAM POSITIVE RODS    Culture   Final    FEW STREPTOCOCCUS ANGINOSIS ABUNDANT BACTEROIDES FRAGILIS BETA LACTAMASE POSITIVE Performed at [facility], [address] [place], [place] [address]    Report Status 08/26/2019 FINAL  Final   Organism ID, Bacteria STREPTOCOCCUS  ANGINOSIS  Final      Susceptibility   Streptococcus anginosis - MIC*    PENICILLIN INTERMEDIATE Intermediate     CEFTRIAXONE 0.5 SENSITIVE Sensitive     ERYTHROMYCIN 2 RESISTANT Resistant     LEVOFLOXACIN 2 SENSITIVE Sensitive     VANCOMYCIN 0.5 SENSITIVE Sensitive     * FEW STREPTOCOCCUS ANGINOSIS  Blood Culture (routine x 2)     Status: None (Preliminary result)   Collection Time: 08/26/19 12:37 PM   Specimen: BLOOD RIGHT ARM  Result Value Ref Range Status   Specimen Description BLOOD RIGHT ARM  Final   Special Requests   Final    BOTTLES DRAWN AEROBIC ONLY Blood Culture results may not be  optimal due to an inadequate volume of blood received in culture bottles   Culture   Final    NO GROWTH 2 DAYS Performed at [facility], [address], [place], [place] [address]    Report Status PENDING  Incomplete  Blood Culture (routine x 2)     Status: None (Preliminary result)   Collection Time: 08/26/19  1:00 PM   Specimen: BLOOD RIGHT FOREARM  Result Value Ref Range Status   Specimen Description BLOOD RIGHT FOREARM  Final   Special Requests   Final    BOTTLES DRAWN AEROBIC ONLY Blood Culture results may not be optimal due to an inadequate volume of blood received in culture bottles   Culture   Final    NO GROWTH 2 DAYS Performed at [facility], [address], [place], [place] [address]    Report Status PENDING  Incomplete  SARS Coronavirus 2 by RT PCR (hospital order, performed in [facility] hospital lab) Nasopharyngeal Nasopharyngeal Swab     Status: None   Collection Time: 08/26/19  1:13 PM   Specimen: Nasopharyngeal Swab  Result Value Ref Range Status   SARS Coronavirus 2 NEGATIVE NEGATIVE Final    Comment: (NOTE) SARS-CoV-2 target nucleic acids are NOT DETECTED.  The SARS-CoV-2 RNA is generally detectable in upper and lower respiratory specimens during the acute phase of infection. The lowest concentration of SARS-CoV-2 viral copies this assay can detect is  250 copies / mL. A negative result does not preclude SARS-CoV-2 infection and should not be used as the sole basis for treatment or other patient management decisions.  A negative result may occur with improper specimen collection / handling, submission of specimen other than nasopharyngeal swab, presence of viral mutation(s) within the areas targeted by this assay, and inadequate number of viral copies (<250 copies / mL). A negative result must be combined with clinical observations, patient history, and epidemiological information.  Fact Sheet for Patients:   BoilerBrush.com.cy  Fact Sheet for Healthcare Providers: https://pope.com/  This test is not yet approved or  cleared by the [place] FDA and has been authorized for detection and/or diagnosis of SARS-CoV-2 by FDA under an Emergency Use Authorization (EUA).  This EUA will remain in effect (meaning this test can be used) for the duration of the COVID-19 declaration under Section 564(b)(1) of the Act, 21 U.S.C. section 360bbb-3(b)(1), unless the authorization is terminated or revoked sooner.  Performed at [facility], [address], [place], [place] [address]   Surgical pcr screen     Status: None   Collection Time: 08/27/19 10:01 AM   Specimen: Nasal Mucosa; Nasal Swab  Result Value Ref Range Status   MRSA, PCR NEGATIVE NEGATIVE Final   Staphylococcus aureus NEGATIVE NEGATIVE Final    Comment: (NOTE) The Xpert SA Assay (FDA approved for NASAL specimens in patients 57 years of age and older), is one component of a comprehensive surveillance program. It is not intended to diagnose infection nor to guide or monitor treatment. Performed at [facility], [address], [place], [place] [address]          Radiology Studies: MR ANKLE RIGHT W WO CONTRAST  Result Date: 08/27/2019 CLINICAL DATA:  History of osteomyelitis and abscess of the right foot  requiring debridement. EXAM: MRI OF THE RIGHT ANKLE WITHOUT AND WITH CONTRAST TECHNIQUE: Multiplanar, multisequence MR imaging of the ankle was performed before and after the administration of intravenous contrast. CONTRAST:  74mL  GADAVIST GADOBUTROL 1 MMOL/ML IV SOLN COMPARISON:  08/08/2019 FINDINGS: Susceptibility artifact resulting from the orthopedic hardware in the distal tibia partially obscures the adjacent soft tissue and osseous structures especially at the level of the ankle joint. TENDONS Peroneal: Peroneal longus tendon intact. Peroneal brevis intact. Posteromedial: Posterior tibial tendon intact. Flexor hallucis longus tendon intact. Flexor digitorum longus tendon intact. Anterior: Tibialis anterior tendon intact. Extensor hallucis longus tendon intact Extensor digitorum longus tendon intact. Achilles: Interval resection of the calcaneal attachment of the Achilles tendon. Plantar Fascia: Interval resection of the calcaneal attachment of the plantar fascia. LIGAMENTS Lateral: Obscured by susceptibility artifact resulting from the distal tibial intramedullary nail. Medial: Obscured by susceptibility artifact resulting from the distal tibial intramedullary nail. CARTILAGE Ankle Joint: Susceptibility artifact resulting from the orthopedic hardware in the distal tibia partially obscures the adjacent soft tissue and osseous structures especially at the level of the ankle joint. Subtalar Joints/Sinus Tarsi: Normal subtalar joints. No subtalar joint effusion. Normal sinus tarsi. Bones/Soft Tissue: Soft tissue ulcer or wound along the posteromedial aspect of the hindfoot at the level of the posterior calcaneus. Interval debridement of the hindfoot soft tissues. Interval posterior calcaneal resection with release of the calcaneal attachment of the plantar fascia and Achilles tendon. No significant marrow signal abnormality. No periosteal reaction or bone destruction. Osteoarthritis of the talonavicular joint.  Small amount of fluid along the flexor digitorum tendons at the level of the midfoot which may reflect a ganglion cyst or tenosynovial fluid. No surrounding inflammatory changes or enhancement to suggest an abscess. IMPRESSION: IMPRESSION Soft tissue ulcer or wound along the posteromedial aspect of the hindfoot at the level of the posterior calcaneus with interval debridement of the hindfoot soft tissues. Interval posterior calcaneal resection with release of the calcaneal attachment of the plantar fascia and Achilles tendon. No evidence of osteomyelitis or abscess. Electronically Signed   By: Kathreen Devoid   On: 08/27/2019 07:20   DG Tibia/Fibula Right Port  Result Date: 08/28/2019 CLINICAL DATA:  RIGHT foot gangrenous infection. 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 EXAM: PORTABLE RIGHT TIBIA AND FIBULA - 2 VIEW COMPARISON:  07/16/2019, 08/21/2016 FINDINGS: Intramedullary rod in the tibia with distal cortical screws, unremarkable in appearance. Chronic deformity of the distal RIGHT tibia and fibula consistent with remote injury. Interval amputation of the RIGHT foot at the tibiotalar joint. The cortical surfaces of the distal tibia and fibula are unremarkable. Postoperative skin clips remain. IMPRESSION: 1. Postoperative changes. 2. No evidence for acute abnormality. Electronically Signed   By: Nolon Nations M.D.   On: 08/28/2019 11:34   VAS [place] LOWER EXTREMITY VENOUS (DVT)  Result Date: 08/26/2019  Lower Venous DVTStudy Indications: Swelling.  Risk Factors: Surgery. Limitations: Body habitus, poor ultrasound/tissue interface, patient pain tolerance, patient movement and open wound. Comparison Study: No prior studies. Performing Technologist: Oliver Hum RVT  Examination Guidelines: A complete evaluation includes B-mode imaging, spectral Doppler, color Doppler, and power Doppler as needed of all accessible portions of each vessel. Bilateral testing is considered an integral part of a complete  examination. Limited examinations for reoccurring indications may be performed as noted. The reflux portion of the exam is performed with the patient in reverse Trendelenburg.  +---------+---------------+---------+-----------+----------+--------------+ RIGHT    CompressibilityPhasicitySpontaneityPropertiesThrombus Aging +---------+---------------+---------+-----------+----------+--------------+ CFV      Full           Yes      Yes                                 +---------+---------------+---------+-----------+----------+--------------+  SFJ      Full                                                        +---------+---------------+---------+-----------+----------+--------------+ FV Prox  Full                                                        +---------+---------------+---------+-----------+----------+--------------+ FV Mid   Full                                                        +---------+---------------+---------+-----------+----------+--------------+ FV DistalFull                                                        +---------+---------------+---------+-----------+----------+--------------+ PFV      Full                                                        +---------+---------------+---------+-----------+----------+--------------+ POP      Full           Yes      Yes                                 +---------+---------------+---------+-----------+----------+--------------+ PTV      Full                                                        +---------+---------------+---------+-----------+----------+--------------+ PERO                                                  Not visualized +---------+---------------+---------+-----------+----------+--------------+ Multiphasic flow is noted in the popliteal, posterior tbial, and anterior tibial arteries.  +----+---------------+---------+-----------+----------+--------------+  LEFTCompressibilityPhasicitySpontaneityPropertiesThrombus Aging +----+---------------+---------+-----------+----------+--------------+ CFV Full           Yes      Yes                                 +----+---------------+---------+-----------+----------+--------------+     Summary: RIGHT: - There is no evidence of deep vein thrombosis in the lower extremity. However, portions of this examination were limited- see technologist comments above.  - No cystic structure found in the popliteal fossa.  LEFT: - No evidence of common femoral vein obstruction.  *  See table(s) above for measurements and observations. Electronically signed by Curt Jews MD on 08/26/2019 at 4:22:37 PM.    Final         Scheduled Meds: . acetaminophen  1,000 mg Oral Q6H  . aspirin EC  81 mg Oral Daily  . citalopram  20 mg Oral Daily  . docusate sodium  100 mg Oral BID  . enoxaparin (LOVENOX) injection  60 mg Subcutaneous Q24H  . insulin aspart  0-15 Units Subcutaneous TID WC  . insulin aspart  0-5 Units Subcutaneous QHS  . sodium chloride flush  3 mL Intravenous Once  . sodium chloride flush  3 mL Intravenous Q12H  . traMADol  50 mg Oral Q6H  . verapamil  240 mg Oral Q breakfast   Continuous Infusions: . sodium chloride 125 mL/hr at 08/26/19 1750  . sodium chloride    . cefTRIAXone (ROCEPHIN)  IV    . lactated ringers 10 mL/hr at 08/27/19 1354  . methocarbamol (ROBAXIN) IV    . metronidazole       LOS: 2 days    Time spent: 47 min    Nicolette Bang, MD [facility]  If 7PM-7AM, please contact night-coverage  08/28/2019, 2:45 PM

## 2019-08-29 ENCOUNTER — Encounter (HOSPITAL_COMMUNITY): Payer: Self-pay | Admitting: Orthopaedic Surgery

## 2019-08-29 LAB — CBC WITH DIFFERENTIAL/PLATELET
Abs Immature Granulocytes: 0.25 10*3/uL — ABNORMAL HIGH (ref 0.00–0.07)
Abs Immature Granulocytes: 0.19 10*3/uL — ABNORMAL HIGH (ref 0.00–0.07)
Basophils Absolute: 0.1 10*3/uL (ref 0.0–0.1)
Basophils Absolute: 0.1 10*3/uL (ref 0.0–0.1)
Basophils Relative: 1 %
Basophils Relative: 1 %
Eosinophils Absolute: 0.1 10*3/uL (ref 0.0–0.5)
Eosinophils Absolute: 0.1 10*3/uL (ref 0.0–0.5)
Eosinophils Relative: 1 %
Eosinophils Relative: 1 %
HCT: 36 % — ABNORMAL LOW (ref 39.0–52.0)
HCT: 35.7 % — ABNORMAL LOW (ref 39.0–52.0)
Hemoglobin: 11.9 g/dL — ABNORMAL LOW (ref 13.0–17.0)
Hemoglobin: 11.6 g/dL — ABNORMAL LOW (ref 13.0–17.0)
Immature Granulocytes: 2 %
Immature Granulocytes: 1 %
Lymphocytes Relative: 10 %
Lymphocytes Relative: 16 %
Lymphs Abs: 1.7 10*3/uL (ref 0.7–4.0)
Lymphs Abs: 2.1 10*3/uL (ref 0.7–4.0)
MCH: 31.3 pg (ref 26.0–34.0)
MCH: 30.9 pg (ref 26.0–34.0)
MCHC: 33.1 g/dL (ref 30.0–36.0)
MCHC: 32.5 g/dL (ref 30.0–36.0)
MCV: 94.7 fL (ref 80.0–100.0)
MCV: 95.2 fL (ref 80.0–100.0)
Monocytes Absolute: 1 10*3/uL (ref 0.1–1.0)
Monocytes Absolute: 0.9 10*3/uL (ref 0.1–1.0)
Monocytes Relative: 6 %
Monocytes Relative: 7 %
Neutro Abs: 13.6 10*3/uL — ABNORMAL HIGH (ref 1.7–7.7)
Neutro Abs: 9.9 10*3/uL — ABNORMAL HIGH (ref 1.7–7.7)
Neutrophils Relative %: 80 %
Neutrophils Relative %: 74 %
Platelets: 185 10*3/uL (ref 150–400)
Platelets: 235 10*3/uL (ref 150–400)
RBC: 3.8 MIL/uL — ABNORMAL LOW (ref 4.22–5.81)
RBC: 3.75 MIL/uL — ABNORMAL LOW (ref 4.22–5.81)
RDW: 13.8 % (ref 11.5–15.5)
RDW: 13.9 % (ref 11.5–15.5)
WBC: 16.7 10*3/uL — ABNORMAL HIGH (ref 4.0–10.5)
WBC: 13.3 10*3/uL — ABNORMAL HIGH (ref 4.0–10.5)
nRBC: 0 % (ref 0.0–0.2)
nRBC: 0 % (ref 0.0–0.2)

## 2019-08-29 LAB — GLUCOSE, CAPILLARY
Glucose-Capillary: 116 mg/dL — ABNORMAL HIGH (ref 70–99)
Glucose-Capillary: 171 mg/dL — ABNORMAL HIGH (ref 70–99)
Glucose-Capillary: 84 mg/dL (ref 70–99)
Glucose-Capillary: 127 mg/dL — ABNORMAL HIGH (ref 70–99)

## 2019-08-29 NOTE — Progress Notes (Signed)
PROGRESS NOTE    [name]  EZM:[id number] DOB: 1960/03/13 DOA: 08/26/2019 PCP: [name], MD   Brief Narrative:  [name] a 59 y.o.malewith medical history significant ofhypertension, type 2 diabetes mellitus multiple other comorbidities including diagnosis of osteomyelitis of right calcaneus and abscess of the right calcaneus is status post incision and drainage done on 08/20/2019 by [name] on outpatient basis and discharged home the same day presented back to ED with the complaint of worsening pain and swelling of the right heel. According to patient, he was discharged home with no antibiotics however wound VAC was attached. Wound VAC was dislodged on "Sunday night by accident. He followed up with Dr. Duda's office on Monday and was seen by PA and was told that his wound looked better. Subsequently, yesterday he received a call from Dr. Duda's PA that his culture from the abscess is growing bacteria so he was prescribed Omnicef which he took last night and this morning however he started having worsening redness, pain and swelling of the right foot along with chills fever and sweating yesterday which persisted today so he came to the emergency department. T-max was 102. Other than that he does not have any other complaint.In ED:patient had some tachypnea as well as tachycardia. Has significant leukocytosis with white cells of 26.6as compared to normal last week. He was diagnosed with severe sepsis based on tachycardia, tachypnea, leukocytosis and lactic acid of 2.2. He received some antibiotics in the ED. Orthopedic on call/Dr. 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  -MRI showed soft tissue ulcer without evidence of osteomyelitis or abscess -Orthopedic surgery following in consult,  -POD 2 SYME amp - c/w abx and wound vac, per ortho note plans for ID/BKA by Dr Duda wed,  -  pt lives with wife-has support -Continue IV fluids,changed abx from zosyn vanc to ctx and flagyl -Continue to follow cultures, initial culture from abscess growing gram-positive cocci and gram-negative rods;blood cultures neg  Essential hypertension: -Home medications includelisinopril, hydrochlorothiazide and verapamil. -Continue verapamil only, will restart other medications as indicated  Hyponatremia, chronic: -Baseline around 130, currently within normal limits -Continue to hold HCTZ  Type 2 diabetes mellitus with polyneuropathy, borderline control: Hold home medications, continue sliding scale insulin, hypoglycemic protocol. Discussed need for medication and dietary compliance at length at bedside  DVT prophylaxis: Lovenox SQ  Code Status: FULL    Code Status Orders  (From admission, onward)         Start     Ordered   08/26/19 1328  Full code  Continuous        08" /05/21 1331        Code Status History    This patient has a current code status but no historical code status.   Advance Care Planning Activity     Family Communication: WIFE AT BEDSIDE  Disposition Plan:  Status is: Inpatient  Remains inpatient appropriate because:Ongoing diagnostic testing needed not appropriate for outpatient work up and Inpatient level  of care appropriate due to severity of illness   Dispo: The patient is from: Home              Anticipated d/c is to: [facility]               Anticipated d/c date is: > 3 days              Patient currently is not medically stable to d/c.       Consults called: ORTHO Admission status: Inpatient   Consultants:   AS ABOVE  Procedures:  MR Ankle Right w/o contrast  Result Date: 08/10/2019 CLINICAL DATA:  Right heel ulceration. Clinical concern for osteomyelitis EXAM: MRI OF THE RIGHT ANKLE WITHOUT CONTRAST TECHNIQUE: Multiplanar, multisequence MR imaging of the ankle was performed. No intravenous contrast was administered. COMPARISON:  X-ray 07/16/2019 FINDINGS: Superficial soft tissue ulceration at the posteromedial aspect of the hindfoot (series 3, images 37-39) with skin thickening and mild adjacent soft tissue edema. No underlying fluid collection. Ulceration does not extend to the underlying calcaneal cortex. TENDONS Peroneal: Intact peroneus longus and peroneus brevis tendons. Posteromedial: Intact tibialis posterior, flexor hallucis longus and flexor digitorum longus tendons. Anterior: Intact tibialis anterior, extensor hallucis longus and extensor digitorum longus tendons. Achilles: Intact. Plantar Fascia: Thickening and intermediate signal within the central and medial bands of the plantar fascia without tear or perifascial edema. LIGAMENTS Lateral: Evaluation of the lateral ankle ligaments is slightly degraded by susceptibility artifact and poor fat saturation related to patient's surgical hardware. The anterior and posterior tibiofibular ligaments are grossly intact. The anterior and posterior talofibular ligaments grossly are intact. Intact calcaneofibular ligament. Medial: Deltoid and spring ligament complex appear grossly intact. CARTILAGE Ankle Joint: Mild cartilage thinning with reactive subchondral marrow signal changes at the lateral talar shoulder. No tibiotalar joint effusion. Subtalar Joints/Sinus Tarsi: No joint effusion or chondral defect. Preservation of the anatomic fat within the sinus tarsi. Bones: Susceptibility  artifact within the distal tibia related to prior ORIF hardware. Mild arthropathy and dorsal hypertrophy within the midfoot. Small plantar calcaneal spur and small enthesophyte at the Achilles tendon insertion. No acute fracture. No marrow edema. No cortical destruction. Other: Mild intramuscular edema within the intrinsic foot musculature. No soft tissue fluid collection. IMPRESSION: 1. Superficial soft tissue ulceration at the posteromedial aspect of the right hindfoot. No underlying fluid collection or evidence of osteomyelitis. 2. Findings suggest chronic plantar fasciitis. 3. Mild intramuscular edema within the intrinsic foot musculature, which may reflect denervation changes versus myositis. 4. Mild arthropathy of the tibiotalar joint and midfoot. Electronically Signed   By: Davina Poke D.O.   On: 08/10/2019 10:26   MR ANKLE RIGHT W WO CONTRAST  Result Date: 08/27/2019 CLINICAL DATA:  History of osteomyelitis and abscess of the right foot requiring debridement. EXAM: MRI OF THE RIGHT ANKLE WITHOUT AND WITH CONTRAST TECHNIQUE: Multiplanar, multisequence MR imaging of the ankle was performed before and after the administration of intravenous contrast. CONTRAST:  27m GADAVIST GADOBUTROL 1 MMOL/ML IV SOLN COMPARISON:  08/08/2019 FINDINGS: Susceptibility artifact resulting from the orthopedic hardware in the distal tibia partially obscures the adjacent soft tissue and osseous structures especially at the level of the ankle joint. TENDONS Peroneal: Peroneal longus tendon intact. Peroneal brevis intact. Posteromedial: Posterior tibial tendon intact. Flexor hallucis longus tendon intact. Flexor digitorum longus tendon intact. Anterior: Tibialis anterior tendon intact. Extensor hallucis longus tendon intact Extensor digitorum longus tendon intact. Achilles: Interval resection of the calcaneal attachment of the Achilles tendon. Plantar Fascia:  Interval resection of the calcaneal attachment of the plantar  fascia. LIGAMENTS Lateral: Obscured by susceptibility artifact resulting from the distal tibial intramedullary nail. Medial: Obscured by susceptibility artifact resulting from the distal tibial intramedullary nail. CARTILAGE Ankle Joint: Susceptibility artifact resulting from the orthopedic hardware in the distal tibia partially obscures the adjacent soft tissue and osseous structures especially at the level of the ankle joint. Subtalar Joints/Sinus Tarsi: Normal subtalar joints. No subtalar joint effusion. Normal sinus tarsi. Bones/Soft Tissue: Soft tissue ulcer or wound along the posteromedial aspect of the hindfoot at the level of the posterior calcaneus. Interval debridement of the hindfoot soft tissues. Interval posterior calcaneal resection with release of the calcaneal attachment of the plantar fascia and Achilles tendon. No significant marrow signal abnormality. No periosteal reaction or bone destruction. Osteoarthritis of the talonavicular joint. Small amount of fluid along the flexor digitorum tendons at the level of the midfoot which may reflect a ganglion cyst or tenosynovial fluid. No surrounding inflammatory changes or enhancement to suggest an abscess. IMPRESSION: IMPRESSION Soft tissue ulcer or wound along the posteromedial aspect of the hindfoot at the level of the posterior calcaneus with interval debridement of the hindfoot soft tissues. Interval posterior calcaneal resection with release of the calcaneal attachment of the plantar fascia and Achilles tendon. No evidence of osteomyelitis or abscess. Electronically Signed   By: Kathreen Devoid   On: 08/27/2019 07:20   DG Chest Port 1 View  Result Date: 08/26/2019 CLINICAL DATA:  Possible sepsis. EXAM: PORTABLE CHEST 1 VIEW COMPARISON:  October 13, 2016. FINDINGS: The heart size and mediastinal contours are within normal limits. Both lungs are clear. The visualized skeletal structures are unremarkable. IMPRESSION: No active disease. Electronically  Signed   By: Marijo Conception M.D.   On: 08/26/2019 12:13   DG Tibia/Fibula Right Port  Result Date: 08/28/2019 CLINICAL DATA:  RIGHT foot gangrenous infection. 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 EXAM: PORTABLE RIGHT TIBIA AND FIBULA - 2 VIEW COMPARISON:  07/16/2019, 08/21/2016 FINDINGS: Intramedullary rod in the tibia with distal cortical screws, unremarkable in appearance. Chronic deformity of the distal RIGHT tibia and fibula consistent with remote injury. Interval amputation of the RIGHT foot at the tibiotalar joint. The cortical surfaces of the distal tibia and fibula are unremarkable. Postoperative skin clips remain. IMPRESSION: 1. Postoperative changes. 2. No evidence for acute abnormality. Electronically Signed   By: Nolon Nations M.D.   On: 08/28/2019 11:34   VAS [place] LOWER EXTREMITY VENOUS (DVT)  Result Date: 08/26/2019  Lower Venous DVTStudy Indications: Swelling.  Risk Factors: Surgery. Limitations: Body habitus, poor ultrasound/tissue interface, patient pain tolerance, patient movement and open wound. Comparison Study: No prior studies. Performing Technologist: Oliver Hum RVT  Examination Guidelines: A complete evaluation includes B-mode imaging, spectral Doppler, color Doppler, and power Doppler as needed of all accessible portions of each vessel. Bilateral testing is considered an integral part of a complete examination. Limited examinations for reoccurring indications may be performed as noted. The reflux portion of the exam is performed with the patient in reverse Trendelenburg.  +---------+---------------+---------+-----------+----------+--------------+ RIGHT    CompressibilityPhasicitySpontaneityPropertiesThrombus Aging +---------+---------------+---------+-----------+----------+--------------+ CFV      Full           Yes      Yes                                 +---------+---------------+---------+-----------+----------+--------------+ SFJ      Full                                                         +---------+---------------+---------+-----------+----------+--------------+  FV Prox  Full                                                        +---------+---------------+---------+-----------+----------+--------------+ FV Mid   Full                                                        +---------+---------------+---------+-----------+----------+--------------+ FV DistalFull                                                        +---------+---------------+---------+-----------+----------+--------------+ PFV      Full                                                        +---------+---------------+---------+-----------+----------+--------------+ POP      Full           Yes      Yes                                 +---------+---------------+---------+-----------+----------+--------------+ PTV      Full                                                        +---------+---------------+---------+-----------+----------+--------------+ PERO                                                  Not visualized +---------+---------------+---------+-----------+----------+--------------+ Multiphasic flow is noted in the popliteal, posterior tbial, and anterior tibial arteries.  +----+---------------+---------+-----------+----------+--------------+ LEFTCompressibilityPhasicitySpontaneityPropertiesThrombus Aging +----+---------------+---------+-----------+----------+--------------+ CFV Full           Yes      Yes                                 +----+---------------+---------+-----------+----------+--------------+     Summary: RIGHT: - There is no evidence of deep vein thrombosis in the lower extremity. However, portions of this examination were limited- see technologist comments above.  - No cystic structure found in the popliteal fossa.  LEFT: - No evidence of common femoral vein obstruction.  *See table(s) above for  measurements and observations. Electronically signed by Curt Jews MD on 08/26/2019 at 4:22:37 PM.    Final      Antimicrobials:  CTX  > 8/7    Subjective: No acute events overnight   Objective: Vitals:   08/28/19 1147 08/28/19 1938 08/29/19 0439 08/29/19 1004  BP: (!) 105/58 111/69 117/71 112/63  Pulse: 61 64 66 68  Resp: '18 16 15 17  ' Temp: (!) 97.4 F (36.3 C) 97.6 F (36.4 C) 97.9 F (36.6 C) 97.6 F (36.4 C)  TempSrc: Oral Oral Oral Oral  SpO2: 96% 100% 95% 95%  Weight:      Height:        Intake/Output Summary (Last 24 hours) at 08/29/2019 1400 Last data filed at 08/29/2019 0500 Gross per 24 hour  Intake --  Output 1000 ml  Net -1000 ml   Filed Weights   08/26/19 0942 08/26/19 1527 08/27/19 1350  Weight: 117.9 kg 119.9 kg 119.9 kg    Examination:  General: Pleasantly resting in bed, No acute distress. HEENT: Normocephalic atraumatic. Sclerae nonicteric, noninjected. Extraocular movements intact bilaterally. Neck: Without mass or deformity. Trachea is midline. Lungs: Clear to auscultate bilaterally without rhonchi, wheeze, or rales. Heart: Regular rate and rhythm. Without murmurs, rubs, or gallops. Abdomen: Soft, nontender, nondistended. Without guarding or rebound. Extremities: post surg dressing in place did not take down, wound vac in place, no evid of bleed thru. Skin: Warm and dry, no erythema, no ulcerations, surg site not fully examined 2/2 postop dressing and wound vac     Data Reviewed: I have personally reviewed following labs and imaging studies  CBC: Recent Labs  Lab 08/26/19 1001 08/27/19 0455 08/28/19 0935 08/29/19 1235  WBC 26.6* 21.5* 16.7* 13.3*  NEUTROABS 23.9*  --  13.6* 9.9*  HGB 14.3 11.6* 11.9* 11.6*  HCT 42.2 35.0* 36.0* 35.7*  MCV 91.9 92.1 94.7 95.2  PLT 267 211 185 945   Basic Metabolic Panel: Recent Labs  Lab 08/26/19 1001 08/26/19 1537 08/27/19 0455 08/28/19 0935  NA 126*  --  130* 132*  K 4.0  --   3.8 4.6  CL 95*  --  99 103  CO2 19*  --  19* 20*  GLUCOSE 132*  --  95 97  BUN 15  --  18 23*  CREATININE 0.96  --  0.89 0.83  CALCIUM 8.8*  --  8.4* 8.1*  MG  --  1.8  --   --    GFR: Estimated Creatinine Clearance: 132.1 mL/min (by C-G formula based on SCr of 0.83 mg/dL). Liver Function Tests: Recent Labs  Lab 08/26/19 1001  AST 79*  ALT 67*  ALKPHOS 90  BILITOT 1.1  PROT 6.9  ALBUMIN 2.0*   No results for input(s): LIPASE, AMYLASE in the last 168 hours. No results for input(s): AMMONIA in the last 168 hours. Coagulation Profile: Recent Labs  Lab 08/26/19 1237 08/27/19 0455  INR 1.3* 1.4*   Cardiac Enzymes: No results for input(s): CKTOTAL, CKMB, CKMBINDEX, TROPONINI in the last 168 hours. BNP (last 3 results) No results for input(s): PROBNP in the last 8760 hours. HbA1C: Recent Labs    08/26/19 1537  HGBA1C 6.6*   CBG: Recent Labs  Lab 08/28/19 1147 08/28/19 1639 08/28/19 2019 08/29/19 0656 08/29/19 1116  GLUCAP 147* 153* 129* 116* 171*   Lipid Profile: No results for input(s): CHOL, HDL, LDLCALC, TRIG, CHOLHDL, LDLDIRECT in the last 72 hours. Thyroid Function Tests: No results for input(s): TSH, T4TOTAL, FREET4, T3FREE, THYROIDAB in the last 72 hours. Anemia Panel: No results for input(s): VITAMINB12, FOLATE, FERRITIN, TIBC, IRON, RETICCTPCT in the last 72 hours. Sepsis Labs: Recent Labs  Lab 08/26/19 1001 08/26/19 1237 08/26/19 1627 08/26/19 1925 08/27/19 0455  PROCALCITON  --   --   --   --  2.67  LATICACIDVEN 2.3* 2.8* 3.3*  1.7  --     Recent Results (from the past 240 hour(s))  SARS Coronavirus 2 by RT PCR     Status: None   Collection Time: 08/20/19 12:38 PM  Result Value Ref Range Status   SARS Coronavirus 2 NEGATIVE NEGATIVE Final    Comment: (NOTE) Result indicates the ABSENCE of SARS-CoV-2 RNA in the patient specimen.   The lowest concentration of SARS-CoV-2 viral copies this assay can detect in nasopharyngeal swab specimens  is 500 copies / mL.  A negative result does not preclude SARS-CoV-2 infection and should not be used as the sole basis for patient management decisions. A negative result may occur with improper specimen collection / handling, submission of a specimen other than nasopharyngeal swab, presence of viral mutation(s) within the areas targeted by this assay, and inadequate number of viral copies (<500 copies / mL) present.  Negative results must be combined with clinical observations, patient history, and epidemiological information.   The expected result is NEGATIVE.   Patient Fact Sheet:  BlogSelections.co.uk    Provider Fact Sheet:  https://lucas.com/    This test is not yet approved or cleared by the [place] FDA and  has been [place] orized for detection and/or diagnosis of SARS-CoV-2 by FDA under an Emergency Use Authorization (EUA).  This EUA will remain in effect (meaning this test can be used) for the duration of  the COVID-19 declaration under Section 564(b)(1) of the Act, 21 U.S.C. section 360bbb-3(b)(1), unless the authorization is terminated or revoked sooner  Performed at [facility], [place] [address], [place], [place] [address]   Aerobic/Anaerobic Culture (surgical/deep wound)     Status: None   Collection Time: 08/20/19  4:47 PM   Specimen: Wound  Result Value Ref Range Status   Specimen Description TISSUE  Final   Special Requests RIGHT FOOT/HEEL WOUND SPEC A  Final   Gram Stain   Final    FEW WBC PRESENT,BOTH PMN AND MONONUCLEAR ABUNDANT GRAM NEGATIVE RODS ABUNDANT GRAM POSITIVE COCCI IN PAIRS RARE GRAM POSITIVE RODS    Culture   Final    FEW STREPTOCOCCUS ANGINOSIS ABUNDANT BACTEROIDES FRAGILIS BETA LACTAMASE POSITIVE Performed at [facility], [place] [address], [place], [place] [address]    Report Status 08/26/2019 FINAL  Final   Organism ID, Bacteria STREPTOCOCCUS ANGINOSIS  Final       Susceptibility   Streptococcus anginosis - MIC*    PENICILLIN INTERMEDIATE Intermediate     CEFTRIAXONE 0.5 SENSITIVE Sensitive     ERYTHROMYCIN 2 RESISTANT Resistant     LEVOFLOXACIN 2 SENSITIVE Sensitive     VANCOMYCIN 0.5 SENSITIVE Sensitive     * FEW STREPTOCOCCUS ANGINOSIS  Blood Culture (routine x 2)     Status: None (Preliminary result)   Collection Time: 08/26/19 12:37 PM   Specimen: BLOOD RIGHT ARM  Result Value Ref Range Status   Specimen Description BLOOD RIGHT ARM  Final   Special Requests   Final    BOTTLES DRAWN AEROBIC ONLY Blood Culture results may not be optimal due to an inadequate volume of blood received in culture bottles   Culture   Final    NO GROWTH 3 DAYS Performed at [facility], [place] [address], [place], [place] [address]    Report Status PENDING  Incomplete  Blood Culture (routine x 2)     Status: None (Preliminary result)   Collection Time: 08/26/19  1:00 PM   Specimen: BLOOD RIGHT FOREARM  Result  Value Ref Range Status   Specimen Description BLOOD RIGHT FOREARM  Final   Special Requests   Final    BOTTLES DRAWN AEROBIC ONLY Blood Culture results may not be optimal due to an inadequate volume of blood received in culture bottles   Culture   Final    NO GROWTH 3 DAYS Performed at [facility], [place] [address] [place], [place] [address]    Report Status PENDING  Incomplete  SARS Coronavirus 2 by RT PCR (hospital order, performed in [facility] hospital lab) Nasopharyngeal Nasopharyngeal Swab     Status: None   Collection Time: 08/26/19  1:13 PM   Specimen: Nasopharyngeal Swab  Result Value Ref Range Status   SARS Coronavirus 2 NEGATIVE NEGATIVE Final    Comment: (NOTE) SARS-CoV-2 target nucleic acids are NOT DETECTED.  The SARS-CoV-2 RNA is generally detectable in upper and lower respiratory specimens during the acute phase of infection. The lowest concentration of SARS-CoV-2 viral copies this assay can detect is 250 copies / mL. A  negative result does not preclude SARS-CoV-2 infection and should not be used as the sole basis for treatment or other patient management decisions.  A negative result may occur with improper specimen collection / handling, submission of specimen other than nasopharyngeal swab, presence of viral mutation(s) within the areas targeted by this assay, and inadequate number of viral copies (<250 copies / mL). A negative result must be combined with clinical observations, patient history, and epidemiological information.  Fact Sheet for Patients:   StrictlyIdeas.no  Fact Sheet for Healthcare Providers: BankingDealers.co.za  This test is not yet approved or  cleared by the [place] FDA and has been authorized for detection and/or diagnosis of SARS-CoV-2 by FDA under an Emergency Use Authorization (EUA).  This EUA will remain in effect (meaning this test can be used) for the duration of the COVID-19 declaration under Section 564(b)(1) of the Act, 21 U.S.C. section 360bbb-3(b)(1), unless the authorization is terminated or revoked sooner.  Performed at [facility], [place] [address], [place], [place] [address]   Surgical pcr screen     Status: None   Collection Time: 08/27/19 10:01 AM   Specimen: Nasal Mucosa; Nasal Swab  Result Value Ref Range Status   MRSA, PCR NEGATIVE NEGATIVE Final   Staphylococcus aureus NEGATIVE NEGATIVE Final    Comment: (NOTE) The Xpert SA Assay (FDA approved for NASAL specimens in patients 67 years of age and older), is one component of a comprehensive surveillance program. It is not intended to diagnose infection nor to guide or monitor treatment. Performed at [facility], [place] [address], [place], [place] [address]          Radiology Studies: DG Tibia/Fibula Right Port  Result Date: 08/28/2019 CLINICAL DATA:  RIGHT foot gangrenous infection. 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 EXAM:  PORTABLE RIGHT TIBIA AND FIBULA - 2 VIEW COMPARISON:  07/16/2019, 08/21/2016 FINDINGS: Intramedullary rod in the tibia with distal cortical screws, unremarkable in appearance. Chronic deformity of the distal RIGHT tibia and fibula consistent with remote injury. Interval amputation of the RIGHT foot at the tibiotalar joint. The cortical surfaces of the distal tibia and fibula are unremarkable. Postoperative skin clips remain. IMPRESSION: 1. Postoperative changes. 2. No evidence for acute abnormality. Electronically Signed   By: Nolon Nations M.D.   On: 08/28/2019 11:34        Scheduled Meds: . aspirin EC  81 mg Oral Daily  . citalopram  20 mg Oral Daily  . docusate sodium  100 mg Oral BID  . enoxaparin (LOVENOX) injection  60 mg Subcutaneous Q24H  . insulin aspart  0-15 Units Subcutaneous TID WC  . insulin aspart  0-5 Units Subcutaneous QHS  . sodium chloride flush  3 mL Intravenous Once  . sodium chloride flush  3 mL Intravenous Q12H  . traMADol  50 mg Oral Q6H  . verapamil  240 mg Oral Q breakfast   Continuous Infusions: . sodium chloride 125 mL/hr at 08/26/19 1750  . sodium chloride 125 mL/hr at 08/28/19 1732  . cefTRIAXone (ROCEPHIN)  IV 2 g (08/28/19 2107)  . lactated ringers 10 mL/hr at 08/27/19 1354  . methocarbamol (ROBAXIN) IV    . metronidazole 500 mg (08/29/19 0546)     LOS: 3 days    Time spent: 28 min    Nicolette Bang, MD [facility]  If 7PM-7AM, please contact night-coverage  08/29/2019, 2:00 PM

## 2019-08-29 NOTE — Plan of Care (Signed)

## 2019-08-30 ENCOUNTER — Telehealth: Payer: Self-pay | Admitting: Radiology

## 2019-08-30 ENCOUNTER — Ambulatory Visit: Payer: BC Managed Care – PPO | Admitting: Physician Assistant

## 2019-08-30 ENCOUNTER — Other Ambulatory Visit: Payer: Self-pay | Admitting: Physician Assistant

## 2019-08-30 ENCOUNTER — Inpatient Hospital Stay (HOSPITAL_COMMUNITY): Payer: BC Managed Care – PPO

## 2019-08-30 LAB — GLUCOSE, CAPILLARY
Glucose-Capillary: 106 mg/dL — ABNORMAL HIGH (ref 70–99)
Glucose-Capillary: 105 mg/dL — ABNORMAL HIGH (ref 70–99)
Glucose-Capillary: 129 mg/dL — ABNORMAL HIGH (ref 70–99)
Glucose-Capillary: 146 mg/dL — ABNORMAL HIGH (ref 70–99)
Glucose-Capillary: 149 mg/dL — ABNORMAL HIGH (ref 70–99)

## 2019-08-30 LAB — CBC WITH DIFFERENTIAL/PLATELET
Abs Immature Granulocytes: 0.3 10*3/uL — ABNORMAL HIGH (ref 0.00–0.07)
Basophils Absolute: 0.1 10*3/uL (ref 0.0–0.1)
Basophils Relative: 1 %
Eosinophils Absolute: 0.1 10*3/uL (ref 0.0–0.5)
Eosinophils Relative: 1 %
HCT: 33.7 % — ABNORMAL LOW (ref 39.0–52.0)
Hemoglobin: 11 g/dL — ABNORMAL LOW (ref 13.0–17.0)
Immature Granulocytes: 3 %
Lymphocytes Relative: 19 %
Lymphs Abs: 2.3 10*3/uL (ref 0.7–4.0)
MCH: 30.8 pg (ref 26.0–34.0)
MCHC: 32.6 g/dL (ref 30.0–36.0)
MCV: 94.4 fL (ref 80.0–100.0)
Monocytes Absolute: 0.9 10*3/uL (ref 0.1–1.0)
Monocytes Relative: 8 %
Neutro Abs: 8.5 10*3/uL — ABNORMAL HIGH (ref 1.7–7.7)
Neutrophils Relative %: 68 %
Platelets: 289 10*3/uL (ref 150–400)
RBC: 3.57 MIL/uL — ABNORMAL LOW (ref 4.22–5.81)
RDW: 13.8 % (ref 11.5–15.5)
WBC: 12.2 10*3/uL — ABNORMAL HIGH (ref 4.0–10.5)
nRBC: 0 % (ref 0.0–0.2)

## 2019-08-30 LAB — BASIC METABOLIC PANEL
Anion gap: 10 (ref 5–15)
BUN: 14 mg/dL (ref 6–20)
CO2: 20 mmol/L — ABNORMAL LOW (ref 22–32)
Calcium: 8.1 mg/dL — ABNORMAL LOW (ref 8.9–10.3)
Chloride: 103 mmol/L (ref 98–111)
Creatinine, Ser: 0.68 mg/dL (ref 0.61–1.24)
GFR calc Af Amer: >60 mL/min (ref >60)
GFR calc non Af Amer: >60 mL/min (ref >60)
Glucose, Bld: 109 mg/dL — ABNORMAL HIGH (ref 70–99)
Potassium: 4.1 mmol/L (ref 3.5–5.1)
Sodium: 133 mmol/L — ABNORMAL LOW (ref 135–145)

## 2019-08-30 NOTE — Progress Notes (Signed)
Physical Therapy Treatment Patient Details Name: [name] MRN: [id number] DOB: [birth year] Today's Date: 08/30/2019    History of Present Illness Pt is a 59 yr old male who had increase in edema in R heel and and wound vac was dislodged. Pt s/p Syme amputation of right lower extremity.  PMH but not limited to:  hypertension, type 2 diabetes mellitus multiple other comorbidities including diagnosis of osteomyelitis of right calcaneus and abscess of the right calcaneus is status post incision and drainage done on 08/20/2019    PT Comments    Pt supine in bed on arrival.  He is very eager to mobilize and move out of the bed to the recliner chair.  Pt required min- guard to mod assistance to mobilize.  He continues to benefit from aggressive rehab in a post acute setting to maximize functional gains. Plan for return to OR for possible BKA on Wednesday.  Will continue to follow for acute therapies.     Follow Up Recommendations  CIR     Equipment Recommendations  None recommended by PT    Recommendations for Other Services Rehab consult     Precautions / Restrictions Precautions Precautions: Fall    Mobility  Bed Mobility Overal bed mobility: Needs Assistance Bed Mobility: Supine to Sit     Supine to sit: Min guard     General bed mobility comments: Cues for hand and foot placement.  Min guard to rise into sitting.  Transfers Overall transfer level: Needs assistance Equipment used: Rolling walker (2 wheeled) Transfers: Sit to/from Stand Sit to Stand: Min assist;From elevated surface         General transfer comment: Pt required bed in elevated position.  Min assistance to boost into standing.  Pt performed x2 trials.  Increased time to extend hips and upper trunk control.  Ambulation/Gait Ambulation/Gait assistance: Mod assist Gait Distance (Feet): 5 Feet Assistive device: Rolling walker (2 wheeled) Gait Pattern/deviations: Step-to pattern;Trunk flexed Gait  velocity: decreased    General Gait Details: Pt with noted  "pivots" of LLE, he did perform a few hop steps to stand, turn, back and sit in recliner.  Pt more confident after his performace this am.   Stairs             Wheelchair Mobility    Modified Rankin (Stroke Patients Only)       Balance Overall balance assessment: Needs assistance   Sitting balance-Leahy Scale: Fair       Standing balance-Leahy Scale: Poor                              Cognition Arousal/Alertness: Awake/alert Behavior During Therapy: WFL for tasks assessed/performed Overall Cognitive Status: Within Functional Limits for tasks assessed                                        Exercises      General Comments        Pertinent Vitals/Pain Pain Assessment: 0-10 Pain Score: 2  Pain Location: RLE Pain Descriptors / Indicators: Aching;Discomfort Pain Intervention(s): Monitored during session;Repositioned    Home Living                      Prior Function            PT Goals (current goals can now be  found in the care plan section) Acute Rehab PT Goals Patient Stated Goal: to continue to move  PT Goal Formulation: With patient Progress towards PT goals: Progressing toward goals    Frequency    Min 3X/week      PT Plan Current plan remains appropriate    Co-evaluation              AM-PAC PT "6 Clicks" Mobility   Outcome Measure  Help needed turning from your back to your side while in a flat bed without using bedrails?: A Little Help needed moving from lying on your back to sitting on the side of a flat bed without using bedrails?: A Little Help needed moving to and from a bed to a chair (including a wheelchair)?: A Lot Help needed standing up from a chair using your arms (e.g., wheelchair or bedside chair)?: A Lot Help needed to walk in hospital room?: A Lot Help needed climbing 3-5 steps with a railing? : Total 6 Click Score:  13    End of Session Equipment Utilized During Treatment: Gait belt Activity Tolerance: Patient tolerated treatment well Patient left: in chair;with call bell/phone within reach Nurse Communication: Mobility status PT Visit Diagnosis: Other abnormalities of gait and mobility (R26.89)     Time: 7408-1448 PT Time Calculation (min) (ACUTE ONLY): 25 min  Charges:  $Therapeutic Activity: 23-37 mins                     [name] , PTA Acute Rehabilitation Services Pager [phone] Office [phone]     [name] 08/30/2019, 2:16 PM

## 2019-08-30 NOTE — Progress Notes (Signed)
PROGRESS NOTE    JT BRABEC  PNP:[id number] DOB: [birth year] DOA: 08/26/2019 PCP: [name], MD   Brief Narrative:  [name] a 59 Arellano medical history significant ofhypertension, type 2 diabetes mellitus multiple other comorbidities including diagnosis of osteomyelitis of right calcaneus and abscess of the right calcaneus is status post incision and drainage done on 08/20/2019 by [name] on outpatient basis and discharged home the same day presented back to ED with the complaint of worsening pain and swelling of the right heel. According to patient, he was discharged home with no antibiotics however wound VAC was attached. Wound VAC was dislodged on Sunday night by accident. He followed up with Dr. [name] office on Monday and was seen by PA and was told that his wound looked better. Subsequently, yesterday he received a call from Dr. [name] PA that his culture from the abscess is growing bacteria so he was prescribed Omnicef which he took last night and this morning however he started having worsening redness, pain and swelling of the right foot along with chills fever and sweating yesterday which persisted today so he came to the emergency department. T-max was 102. Other than that he does not have any other complaint.In ZN:BVAPOLI had some tachypnea as well as tachycardia. Has significant leukocytosis with white cells of 26.6as compared to normal last week. He was diagnosed with severe sepsis based on tachycardia, tachypnea, leukocytosis and lactic acid of 2.2. He received some antibiotics in the ED. Orthopedic on call/Dr. 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  -MRI showed soft tissue ulcer without evidence of osteomyelitis or abscess -Orthopedic surgery following in consult, -POD 2 SYME amp - c/w abx and wound vac, per ortho note plans for ID/BKA by Dr [name] on wed,  - pt lives with wife-has good social support -Continue IV fluids,changed abx from zosyn vanc to ctx and flagyl. - Continue to follow cultures, initial culture from abscess growing gram-positive cocci and gram-negative rods;blood culturesneg.  Essential hypertension: -Home medications includelisinopril, hydrochlorothiazide and verapamil. -Continue verapamil only, will restart other medications as indicated  Hyponatremia, chronic: -Baseline around 130, currently within normal limits. -Continue to hold HCTZ.  Type 2 diabetes mellitus with polyneuropathy, borderline control: Hold home medications, continue sliding scale insulin, hypoglycemic protocol. Discussed need for medication and dietary compliance at length at bedside  DVT prophylaxis: Lovenox Code Status: Full Family Communication:No family at bedside, discussed with patient in detail Disposition Plan:  Dispo: The patient is from: Home  Anticipated d/c is to: CIR  Anticipated d/c date is: > 3 days  Patient currently is not medically stable to d/c.   Consultants:   Orthopeadics.  Procedures:  Antimicrobials:  Anti-infectives (From admission, onward)   Start     Dose/Rate Route Frequency Ordered Stop   08/28/19 2200  cefTRIAXone (ROCEPHIN) 2 g in sodium chloride 0.9 % 100 mL IVPB     Discontinue     2 g 200 mL/hr over 30 Minutes Intravenous Every 24 hours 08/28/19 1340  08/28/19  2200  metroNIDAZOLE (FLAGYL) IVPB 500 mg     Discontinue     500 mg 100 mL/hr over 60 Minutes Intravenous Every 8 hours 08/28/19 1340     08/27/19 0200  vancomycin (VANCOREADY) IVPB 750 mg/150 mL  Status:  Discontinued        750 mg 150 mL/hr over 60 Minutes Intravenous Every 12 hours 08/26/19 1231 08/28/19 1340   08/26/19 2200  piperacillin-tazobactam (ZOSYN) IVPB 3.375 g  Status:  Discontinued        3.375 g 12.5 mL/hr over 240 Minutes Intravenous Every 8 hours 08/26/19 1412 08/28/19 1340   08/26/19 1345  vancomycin (VANCOCIN) IVPB 1000 mg/200 mL premix  Status:  Discontinued        1,000 mg 200 mL/hr over 60 Minutes Intravenous  Once 08/26/19 1331 08/26/19 1352   08/26/19 1345  piperacillin-tazobactam (ZOSYN) IVPB 3.375 g        3.375 g 100 mL/hr over 30 Minutes Intravenous  Once 08/26/19 1331 08/26/19 1701   08/26/19 1345  vancomycin (VANCOCIN) IVPB 1000 mg/200 mL premix  Status:  Discontinued        1,000 mg 200 mL/hr over 60 Minutes Intravenous  Once 08/26/19 1331 08/26/19 1352   08/26/19 1145  vancomycin (VANCOCIN) IVPB 1000 mg/200 mL premix  Status:  Discontinued        1,000 mg 200 mL/hr over 60 Minutes Intravenous  Once 08/26/19 1138 08/26/19 1139   08/26/19 1145  cefTRIAXone (ROCEPHIN) 2 g in sodium chloride 0.9 % 100 mL IVPB        2 g 200 mL/hr over 30 Minutes Intravenous  Once 08/26/19 1138 08/26/19 1342   08/26/19 1145  vancomycin (VANCOREADY) IVPB 2000 mg/400 mL        2,000 mg 200 mL/hr over 120 Minutes Intravenous  Once 08/26/19 1139 08/26/19 1629     Subjective: Patient was seen and examined at bedside.  He has participated in physical therapy.  No overnight events.  He reports pain is better controlled.  Objective: Vitals:   08/29/19 1954 08/30/19 0347 08/30/19 1023 08/30/19 1435  BP: 118/63 123/74 133/66 128/71  Pulse: 65 64 64 68  Resp: '17 18 12 14  ' Temp: 98.4 F (36.9 C) 98.4 F (36.9 C) 98 F (36.7 C)   TempSrc: Oral Oral Oral   SpO2: 95% 96% 98%  100%  Weight:      Height:        Intake/Output Summary (Last 24 hours) at 08/30/2019 1559 Last data filed at 08/30/2019 0700 Gross per 24 hour  Intake 1455 ml  Output 1750 ml  Net -295 ml   Filed Weights   08/26/19 0942 08/26/19 1527 08/27/19 1350  Weight: 117.9 kg 119.9 kg 119.9 kg    Examination:  General exam: Appears calm and comfortable  Respiratory system: Clear to auscultation. Respiratory effort normal. Cardiovascular system: S1 & S2 heard, RRR. No JVD, murmurs, rubs, gallops or clicks. No pedal edema. Gastrointestinal system: Abdomen is nondistended, soft and nontender. No organomegaly or masses felt. Normal bowel sounds heard. Central nervous system: Alert and oriented. No focal neurological deficits. Extremities: Right Above ankle amputation. Skin: No rashes, lesions or ulcers Psychiatry: Judgement and insight appear normal. Mood & affect appropriate.     Data Reviewed: I have personally reviewed following labs and imaging studies  CBC: Recent Labs  Lab 08/26/19 1001 08/27/19 0455 08/28/19 0935 08/29/19 1235 08/30/19 0238  WBC 26.6* 21.5* 16.7* 13.3* 12.2*  NEUTROABS 23.9*  --  13.6* 9.9* 8.5*  HGB 14.3 11.6* 11.9* 11.6* 11.0*  HCT 42.2 35.0* 36.0* 35.7* 33.7*  MCV 91.9 92.1 94.7 95.2 94.4  PLT 267 211 185 235 035   Basic Metabolic Panel: Recent Labs  Lab 08/26/19 1001 08/26/19 1537 08/27/19 0455 08/28/19 0935 08/30/19 0238  NA 126*  --  130* 132* 133*  K 4.0  --  3.8 4.6 4.1  CL 95*  --  99 103 103  CO2 19*  --  19* 20* 20*  GLUCOSE 132*  --  95 97 109*  BUN 15  --  18 23* 14  CREATININE 0.96  --  0.89 0.83 0.68  CALCIUM 8.8*  --  8.4* 8.1* 8.1*  MG  --  1.8  --   --   --    GFR: Estimated Creatinine Clearance: 137.1 mL/min (by C-G formula based on SCr of 0.68 mg/dL). Liver Function Tests: Recent Labs  Lab 08/26/19 1001  AST 79*  ALT 67*  ALKPHOS 90  BILITOT 1.1  PROT 6.9  ALBUMIN 2.0*   No results for input(s): LIPASE, AMYLASE in  the last 168 hours. No results for input(s): AMMONIA in the last 168 hours. Coagulation Profile: Recent Labs  Lab 08/26/19 1237 08/27/19 0455  INR 1.3* 1.4*   Cardiac Enzymes: No results for input(s): CKTOTAL, CKMB, CKMBINDEX, TROPONINI in the last 168 hours. BNP (last 3 results) No results for input(s): PROBNP in the last 8760 hours. HbA1C: No results for input(s): HGBA1C in the last 72 hours. CBG: Recent Labs  Lab 08/29/19 1639 08/29/19 2114 08/30/19 0654 08/30/19 0735 08/30/19 1132  GLUCAP 84 127* 106* 105* 129*   Lipid Profile: No results for input(s): CHOL, HDL, LDLCALC, TRIG, CHOLHDL, LDLDIRECT in the last 72 hours. Thyroid Function Tests: No results for input(s): TSH, T4TOTAL, FREET4, T3FREE, THYROIDAB in the last 72 hours. Anemia Panel: No results for input(s): VITAMINB12, FOLATE, FERRITIN, TIBC, IRON, RETICCTPCT in the last 72 hours. Sepsis Labs: Recent Labs  Lab 08/26/19 1001 08/26/19 1237 08/26/19 1627 08/26/19 1925 08/27/19 0455  PROCALCITON  --   --   --   --  2.67  LATICACIDVEN 2.3* 2.8* 3.3* 1.7  --     Recent Results (from the past 240 hour(s))  Aerobic/Anaerobic Culture (surgical/deep wound)     Status: None   Collection Time: 08/20/19  4:47 PM   Specimen: Wound  Result Value Ref Range Status   Specimen Description TISSUE  Final   Special Requests RIGHT FOOT/HEEL WOUND SPEC A  Final   Gram Stain   Final    FEW WBC PRESENT,BOTH PMN AND MONONUCLEAR ABUNDANT GRAM NEGATIVE RODS ABUNDANT GRAM POSITIVE COCCI IN PAIRS RARE GRAM POSITIVE RODS    Culture   Final    FEW STREPTOCOCCUS ANGINOSIS ABUNDANT BACTEROIDES FRAGILIS BETA LACTAMASE POSITIVE Performed at [facility], [place] [address], [place],  [address]    Report Status 08/26/2019 FINAL  Final   Organism ID, Bacteria STREPTOCOCCUS ANGINOSIS  Final      Susceptibility   Streptococcus anginosis - MIC*    PENICILLIN INTERMEDIATE Intermediate     CEFTRIAXONE 0.5 SENSITIVE  Sensitive     ERYTHROMYCIN 2 RESISTANT Resistant     LEVOFLOXACIN 2 SENSITIVE Sensitive     VANCOMYCIN 0.5 SENSITIVE Sensitive     * FEW STREPTOCOCCUS ANGINOSIS  Blood Culture (routine x 2)     Status: None (Preliminary result)   Collection Time: 08/26/19 12:37 PM   Specimen: BLOOD RIGHT ARM  Result Value Ref Range Status   Specimen Description BLOOD RIGHT ARM  Final   Special Requests   Final    BOTTLES DRAWN AEROBIC ONLY Blood Culture results may not be optimal due to an inadequate volume of blood received in culture bottles   Culture   Final    NO GROWTH 4 DAYS Performed at [facility] [address], [place], [place] [address]    Report Status PENDING  Incomplete  Blood Culture (routine x 2)     Status: None (Preliminary result)   Collection Time: 08/26/19  1:00 PM   Specimen: BLOOD RIGHT FOREARM  Result Value Ref Range Status   Specimen Description BLOOD RIGHT FOREARM  Final   Special Requests   Final    BOTTLES DRAWN AEROBIC ONLY Blood Culture results may not be optimal due to an inadequate volume of blood received in culture bottles   Culture   Final    NO GROWTH 4 DAYS Performed at [facility], [place] [address], [place], [place] [address]    Report Status PENDING  Incomplete  SARS Coronavirus 2 by RT PCR (hospital order, performed in [facility] hospital lab) Nasopharyngeal Nasopharyngeal Swab     Status: None   Collection Time: 08/26/19  1:13 PM   Specimen: Nasopharyngeal Swab  Result Value Ref Range Status   SARS Coronavirus 2 NEGATIVE NEGATIVE Final    Comment: (NOTE) SARS-CoV-2 target nucleic acids are NOT DETECTED.  The SARS-CoV-2 RNA is generally detectable in upper and lower respiratory specimens during the acute phase of infection. The lowest concentration of SARS-CoV-2 viral copies this assay can detect is 250 copies / mL. A negative result does not preclude SARS-CoV-2 infection and should not be used as the sole basis for treatment or  other patient management decisions.  A negative result may occur with improper specimen collection / handling, submission of specimen other than nasopharyngeal swab, presence of viral mutation(s) within the areas targeted by this assay, and inadequate number of viral copies (<250 copies / mL). A negative result must be combined with clinical observations, patient history, and epidemiological information.  Fact Sheet for Patients:   StrictlyIdeas.no  Fact Sheet for Healthcare Providers: BankingDealers.co.za  This test is not yet approved or  cleared by the [place] FDA and has been authorized for detection and/or diagnosis of SARS-CoV-2 by FDA under an Emergency Use Authorization (EUA).  This EUA will remain in effect (meaning this test can be used) for the duration of the COVID-19 declaration under Section 564(b)(1) of the Act, 21 U.S.C. section 360bbb-3(b)(1), unless the authorization is terminated or revoked sooner.  Performed at [facility], [place] [address] [place], [place] [address]   Surgical pcr screen     Status: None   Collection Time: 08/27/19 10:01 AM   Specimen: Nasal Mucosa; Nasal Swab  Result Value Ref Range Status   MRSA, PCR NEGATIVE NEGATIVE Final   Staphylococcus aureus NEGATIVE NEGATIVE Final    Comment: (NOTE) The Xpert SA Assay (FDA approved for NASAL specimens in patients 75 years of age and older), is one component of a comprehensive surveillance program. It is not intended to diagnose infection nor to guide or monitor treatment. Performed at [facility], [place] [address], [place],  [address]          Radiology Studies: DG Knee 1-2 Views Right  Result Date: 08/30/2019 CLINICAL DATA:  Retained orthopedic hardware EXAM: RIGHT KNEE - 1-2 VIEW COMPARISON:  None. FINDINGS: No acute fracture or dislocation. No aggressive osseous lesion. Mild medial femorotibial compartment joint space  narrowing. Right tibial intramedullary nail. Mild soft tissue edema in the subcutaneous fat. IMPRESSION: Right tibial intramedullary nail partially visualized. Electronically Signed   By: Kathreen Devoid   On: 08/30/2019 10:59   Scheduled Meds: . aspirin EC  81 mg Oral Daily  . citalopram  20 mg Oral Daily  . docusate sodium  100 mg Oral BID  . enoxaparin (LOVENOX) injection  60 mg Subcutaneous Q24H  . insulin aspart  0-15 Units Subcutaneous TID WC  . insulin aspart  0-5 Units Subcutaneous QHS  . sodium chloride flush  3 mL Intravenous Once  . sodium chloride flush  3 mL Intravenous Q12H  . traMADol  50 mg Oral Q6H  . verapamil  240 mg Oral Q breakfast   Continuous Infusions: . sodium chloride 125 mL/hr at 08/26/19 1750  . sodium chloride 125 mL/hr at 08/28/19 1732  . cefTRIAXone (ROCEPHIN)  IV 2 g (08/29/19 2303)  . lactated ringers 10 mL/hr at 08/27/19 1354  . methocarbamol (ROBAXIN) IV    . metronidazole 500 mg (08/30/19 1326)     LOS: 4 days    Time spent:  25 mins.    Shawna Clamp, MD [facility]   If 7PM-7AM, please contact night-coverage

## 2019-08-30 NOTE — Progress Notes (Signed)
Patient is status post I&D foot excision by Dr. Roda Shutters.  He is lying in bed his wife is at his bedside.  He is concerned that he has not seen Dr. Lajoyce Corners.  Wife was asking about transfer of care.  Vital signs stable afebrile VAC is functioning.  Patient is alert and pleasant though frustrated.  I explained to the patient and his wife the situation and reason Dr. Lajoyce Corners has not been available to see the patient.  I assured them that I had been in constant contact with him and coverage team.  With regards to his discharge after his I&D he was told to continue taking doxycycline.  I explained to his wife that I change the antibiotic once the cultures came back and I consulted the infectious disease pharmacologist.  Unfortunately I do not think either of it would have made a difference.  I did explain to the patient our goal is for him return to ambulating.  He does have an IM rod in the right tib-fib which will have to be addressed with a below-knee amputation.  Will discuss with Dr. Lajoyce Corners but plan will be for surgery on Wednesday I acknowledge the frustration the patient had and apologized.  They were understanding Dr. Lajoyce Corners will see patient tomorrow

## 2019-08-30 NOTE — Progress Notes (Signed)
Inpatient Rehabilitation Admissions Coordinator  Inpatient rehab consult received.. I met with patient and his wife for rehab assessment. Noted plans for additional surgery on Wednesday. I will follow up postoperatively to assist with planning rehab venue options depending on his progress.  Danne Baxter, RN, MSN Rehab Admissions Coordinator [phone] 08/30/2019 2:38 PM

## 2019-08-30 NOTE — Progress Notes (Signed)
RN called to room by NT. WoundVac has fallen off. Upon inspection R calf appears to have increased edema and redness. Vac has had no output today. Patient having increased pain, decreased sensation in R leg (unchanged), pulse in R popliteal still present and patient afebrile. VSS. Contacted Duda's team and RN redirected to call Xu's team. Spoke with PA Dub Mikes and advised to replace tubing if possible and to continue to monitor leg closely. RN also consulted with charge RN and advised to call Rapid Response RN to assess. Rapid assessed and advised to continue monitoring closely. RN will continue to monitor.

## 2019-08-30 NOTE — Telephone Encounter (Signed)
Will hold until tomorrow to ask Dr. Lajoyce Corners this pt is going to be scheduled for a BKA will verify with Dr. Lajoyce Corners upon return to the office.

## 2019-08-30 NOTE — Telephone Encounter (Signed)
Ladonna Snide with [facility] called to check on referral that was entered for patient to see them. This referral was entered by Denny Peon initially for non healing ulcer, however, patient has had 2 surgeries on right lower extremity since then.  She would like to know if you would still like for them to follow up with this patient once he is released from hospital.    Please call Ladonna Snide to advise. [phone]

## 2019-08-31 LAB — GLUCOSE, CAPILLARY
Glucose-Capillary: 127 mg/dL — ABNORMAL HIGH (ref 70–99)
Glucose-Capillary: 124 mg/dL — ABNORMAL HIGH (ref 70–99)
Glucose-Capillary: 133 mg/dL — ABNORMAL HIGH (ref 70–99)
Glucose-Capillary: 104 mg/dL — ABNORMAL HIGH (ref 70–99)

## 2019-08-31 LAB — BASIC METABOLIC PANEL
Anion gap: 11 (ref 5–15)
BUN: 10 mg/dL (ref 6–20)
CO2: 20 mmol/L — ABNORMAL LOW (ref 22–32)
Calcium: 8 mg/dL — ABNORMAL LOW (ref 8.9–10.3)
Chloride: 100 mmol/L (ref 98–111)
Creatinine, Ser: 0.57 mg/dL — ABNORMAL LOW (ref 0.61–1.24)
GFR calc Af Amer: >60 mL/min (ref >60)
GFR calc non Af Amer: >60 mL/min (ref >60)
Glucose, Bld: 129 mg/dL — ABNORMAL HIGH (ref 70–99)
Potassium: 4 mmol/L (ref 3.5–5.1)
Sodium: 131 mmol/L — ABNORMAL LOW (ref 135–145)

## 2019-08-31 LAB — CULTURE, BLOOD (ROUTINE X 2)
Culture: NO GROWTH
Culture: NO GROWTH

## 2019-08-31 LAB — CBC
HCT: 35 % — ABNORMAL LOW (ref 39.0–52.0)
Hemoglobin: 11.3 g/dL — ABNORMAL LOW (ref 13.0–17.0)
MCH: 30.7 pg (ref 26.0–34.0)
MCHC: 32.3 g/dL (ref 30.0–36.0)
MCV: 95.1 fL (ref 80.0–100.0)
Platelets: 285 10*3/uL (ref 150–400)
RBC: 3.68 MIL/uL — ABNORMAL LOW (ref 4.22–5.81)
RDW: 14 % (ref 11.5–15.5)
WBC: 10.8 10*3/uL — ABNORMAL HIGH (ref 4.0–10.5)
nRBC: 0 % (ref 0.0–0.2)

## 2019-08-31 MED ORDER — DEXTROSE 5 % IV SOLN
3.0000 g | INTRAVENOUS | Status: AC
  Administered 2019-09-01: 3 g via INTRAVENOUS
  Filled 2019-08-31: qty 3

## 2019-08-31 NOTE — Progress Notes (Signed)
PROGRESS NOTE    [name]  FEO:[id number] DOB: 1960/08/16 DOA: 08/26/2019 PCP: [name], MD   Brief Narrative:  [name] a 59 y.o.malewith medical history significant ofhypertension, type 2 diabetes mellitus,  multiple other comorbidities including diagnosis of osteomyelitis of right calcaneus and abscess of the right calcaneus is status post incision and drainage done on 08/20/2019 by [name] on outpatient basis and discharged home the same day presented back to ED with the complaint of worsening pain and swelling of the right heel. According to patient, he was discharged home with no antibiotics however wound VAC was attached. Wound VAC was dislodged on Sunday night by accident. He followed up with Dr. [name] office on Monday and was seen by PA and was told that his wound looked better. Subsequently, yesterday he received a call from Dr. [name] PA that his culture from the abscess is growing bacteria so he was prescribed Omnicef which he took last night and this morning however he started having worsening redness, pain and swelling of the right foot along with chills fever and sweating yesterday which persisted today so he came to the emergency department. T-max was 102. Other than that he does not have any other complaint.In TG:PQDIYME had some tachypnea as well as tachycardia. Has significant leukocytosis with white cells of 26.6as compared to normal last week. He was diagnosed with severe sepsis based on tachycardia, tachypnea, leukocytosis and lactic acid of 2.2. He received some antibiotics in the ED. Orthopedic on call/Dr. [name] consulted and admission was deferred to hospital service.Covid swab negative - patient is unvaccinated. Patient is admitted for severe sepsis secondary to right calcaneal cellulitis versus osteomyelitis.  Assessment & Plan:   Principal Problem:   Severe sepsis ([place]) Active Problems:   Essential hypertension   T2_NIDDM  w/Peripheral Neuropathy   COPD (chronic obstructive pulmonary disease) ([name])   Poorly controlled diabetes mellitus ([name])   Osteomyelitis of ankle or foot, acute ([name])   Hyponatremia   Severe sepsis secondary to right calcanealulcer/cellulitis rule out abscess/osteomyelitis, POA: -Patient met sepsis criteria based onsource withtachycardia, tachypnea, leukocytosis, lactic acid of 2.3.  -MRI showed soft tissue ulcer without evidence of osteomyelitis or abscess -Orthopedic surgery following in consult, -POD 2 SYME amp - c/w abx and wound vac, per ortho note plans for ID/BKA by Dr [name] on wed, 8/11. - Pt lives with wife-has good social support -Continue IV fluids,changed abx from zosyn vanc to ctx and flagyl. - Continue to follow cultures, initial culture from abscess growing gram-positive cocci and gram-negative rods;blood culturesneg.  Essential hypertension: -Home medications includelisinopril, hydrochlorothiazide and verapamil. -Continue verapamil only, will restart other medications as BP allows.  Hyponatremia, chronic: -Baseline around 130, currently within normal limits. -Continue to hold HCTZ.  Type 2 diabetes mellitus with polyneuropathy, borderline control: Hold home medications, continue sliding scale insulin, hypoglycemic protocol. Discussed need for medication and dietary compliance at length at bedside  DVT prophylaxis: Lovenox Code Status: Full Family Communication: Wife was at bedside, discussed with patient in detail Disposition Plan:  Dispo: The patient is from: Home  Anticipated d/c is to: [facility]  Anticipated d/c date is: > 3 days.  Patient currently is not medically stable to d/c.   Consultants:   Orthopeadics.  Procedures:  Antimicrobials:  Anti-infectives (From admission, onward)   Start     Dose/Rate Route Frequency Ordered Stop   09/01/19 0600  ceFAZolin (ANCEF) 3 g in dextrose 5 % 50 mL IVPB      Discontinue  3 g 100 mL/hr over 30 Minutes Intravenous On call to O.R. 08/31/19 0941 09/02/19 0559   08/28/19 2200  cefTRIAXone (ROCEPHIN) 2 g in sodium chloride 0.9 % 100 mL IVPB     Discontinue     2 g 200 mL/hr over 30 Minutes Intravenous Every 24 hours 08/28/19 1340     08/28/19 2200  metroNIDAZOLE (FLAGYL) IVPB 500 mg     Discontinue     500 mg 100 mL/hr over 60 Minutes Intravenous Every 8 hours 08/28/19 1340     08/27/19 0200  vancomycin (VANCOREADY) IVPB 750 mg/150 mL  Status:  Discontinued        750 mg 150 mL/hr over 60 Minutes Intravenous Every 12 hours 08/26/19 1231 08/28/19 1340   08/26/19 2200  piperacillin-tazobactam (ZOSYN) IVPB 3.375 g  Status:  Discontinued        3.375 g 12.5 mL/hr over 240 Minutes Intravenous Every 8 hours 08/26/19 1412 08/28/19 1340   08/26/19 1345  vancomycin (VANCOCIN) IVPB 1000 mg/200 mL premix  Status:  Discontinued        1,000 mg 200 mL/hr over 60 Minutes Intravenous  Once 08/26/19 1331 08/26/19 1352   08/26/19 1345  piperacillin-tazobactam (ZOSYN) IVPB 3.375 g        3.375 g 100 mL/hr over 30 Minutes Intravenous  Once 08/26/19 1331 08/26/19 1701   08/26/19 1345  vancomycin (VANCOCIN) IVPB 1000 mg/200 mL premix  Status:  Discontinued        1,000 mg 200 mL/hr over 60 Minutes Intravenous  Once 08/26/19 1331 08/26/19 1352   08/26/19 1145  vancomycin (VANCOCIN) IVPB 1000 mg/200 mL premix  Status:  Discontinued        1,000 mg 200 mL/hr over 60 Minutes Intravenous  Once 08/26/19 1138 08/26/19 1139   08/26/19 1145  cefTRIAXone (ROCEPHIN) 2 g in sodium chloride 0.9 % 100 mL IVPB        2 g 200 mL/hr over 30 Minutes Intravenous  Once 08/26/19 1138 08/26/19 1342   08/26/19 1145  vancomycin (VANCOREADY) IVPB 2000 mg/400 mL        2,000 mg 200 mL/hr over 120 Minutes Intravenous  Once 08/26/19 1139 08/26/19 1629     Subjective: Patient was seen and examined at bedside.  He has participated in physical therapy.  No overnight events.  He reports  pain is better controlled.  He is anticipating below-knee amputation on Friday.  Objective: Vitals:   08/30/19 2000 08/31/19 0349 08/31/19 0800 08/31/19 1438  BP: (!) 144/83 129/72 130/67 118/64  Pulse: 67 62 66 66  Resp: _0 Temp: 98.2 F (36.8 C) 97.7 F (36.5 C) 97.7 F (36.5 C) 97.9 F (36.6 C)  TempSrc: Oral Oral Oral Oral  SpO2: 98% 95% 96% 100%  Weight:      Height:        Intake/Output Summary (Last 24 hours) at 08/31/2019 1545 Last data filed at 08/31/2019 1330 Gross per 24 hour  Intake 960 ml  Output 2100 ml  Net -1140 ml   Filed Weights   08/26/19 0942 08/26/19 1527 08/27/19 1350  Weight: 117.9 kg 119.9 kg 119.9 kg    Examination:  General exam: Appears calm and comfortable  Respiratory system: Clear to auscultation. Respiratory effort normal. Cardiovascular system: S1 & S2 heard, RRR. No JVD, murmurs, rubs, gallops or clicks. No pedal edema. Gastrointestinal system: Abdomen is nondistended, soft and nontender. No organomegaly or masses felt. Normal bowel sounds heard. Central nervous system: Alert  and oriented. No focal neurological deficits. Extremities: Right Above ankle amputation. Skin: No rashes, lesions or ulcers Psychiatry: Judgement and insight appear normal. Mood & affect appropriate.     Data Reviewed: I have personally reviewed following labs and imaging studies  CBC: Recent Labs  Lab 08/26/19 1001 08/26/19 1001 08/27/19 0455 08/28/19 0935 08/29/19 1235 08/30/19 0238 08/31/19 0325  WBC 26.6*   < > 21.5* 16.7* 13.3* 12.2* 10.8*  NEUTROABS 23.9*  --   --  13.6* 9.9* 8.5*  --   HGB 14.3   < > 11.6* 11.9* 11.6* 11.0* 11.3*  HCT 42.2   < > 35.0* 36.0* 35.7* 33.7* 35.0*  MCV 91.9   < > 92.1 94.7 95.2 94.4 95.1  PLT 267   < > 211 185 235 289 285   < > = values in this interval not displayed.   Basic Metabolic Panel: Recent Labs  Lab 08/26/19 1001 08/26/19 1537 08/27/19 0455 08/28/19 0935 08/30/19 0238 08/31/19 0325  NA  126*  --  130* 132* 133* 131*  K 4.0  --  3.8 4.6 4.1 4.0  CL 95*  --  99 103 103 100  CO2 19*  --  19* 20* 20* 20*  GLUCOSE 132*  --  95 97 109* 129*  BUN 15  --  18 23* 14 10  CREATININE 0.96  --  0.89 0.83 0.68 0.57*  CALCIUM 8.8*  --  8.4* 8.1* 8.1* 8.0*  MG  --  1.8  --   --   --   --    GFR: Estimated Creatinine Clearance: 137.1 mL/min (A) (by C-G formula based on SCr of 0.57 mg/dL (L)). Liver Function Tests: Recent Labs  Lab 08/26/19 1001  AST 79*  ALT 67*  ALKPHOS 90  BILITOT 1.1  PROT 6.9  ALBUMIN 2.0*   No results for input(s): LIPASE, AMYLASE in the last 168 hours. No results for input(s): AMMONIA in the last 168 hours. Coagulation Profile: Recent Labs  Lab 08/26/19 1237 08/27/19 0455  INR 1.3* 1.4*   Cardiac Enzymes: No results for input(s): CKTOTAL, CKMB, CKMBINDEX, TROPONINI in the last 168 hours. BNP (last 3 results) No results for input(s): PROBNP in the last 8760 hours. HbA1C: No results for input(s): HGBA1C in the last 72 hours. CBG: Recent Labs  Lab 08/30/19 1132 08/30/19 1715 08/30/19 2100 08/31/19 0652 08/31/19 1147  GLUCAP 129* 146* 149* 127* 124*   Lipid Profile: No results for input(s): CHOL, HDL, LDLCALC, TRIG, CHOLHDL, LDLDIRECT in the last 72 hours. Thyroid Function Tests: No results for input(s): TSH, T4TOTAL, FREET4, T3FREE, THYROIDAB in the last 72 hours. Anemia Panel: No results for input(s): VITAMINB12, FOLATE, FERRITIN, TIBC, IRON, RETICCTPCT in the last 72 hours. Sepsis Labs: Recent Labs  Lab 08/26/19 1001 08/26/19 1237 08/26/19 1627 08/26/19 1925 08/27/19 0455  PROCALCITON  --   --   --   --  2.67  LATICACIDVEN 2.3* 2.8* 3.3* 1.7  --     Recent Results (from the past 240 hour(s))  Blood Culture (routine x 2)     Status: None   Collection Time: 08/26/19 12:37 PM   Specimen: BLOOD RIGHT ARM  Result Value Ref Range Status   Specimen Description BLOOD RIGHT ARM  Final   Special Requests   Final    BOTTLES DRAWN  AEROBIC ONLY Blood Culture results may not be optimal due to an inadequate volume of blood received in culture bottles   Culture   Final  NO GROWTH 5 DAYS Performed at [facility], [place] [address], [place], [place] [address]    Report Status 08/31/2019 FINAL  Final  Blood Culture (routine x 2)     Status: None   Collection Time: 08/26/19  1:00 PM   Specimen: BLOOD RIGHT FOREARM  Result Value Ref Range Status   Specimen Description BLOOD RIGHT FOREARM  Final   Special Requests   Final    BOTTLES DRAWN AEROBIC ONLY Blood Culture results may not be optimal due to an inadequate volume of blood received in culture bottles   Culture   Final    NO GROWTH 5 DAYS Performed at [facility], [place] [address], [place], [place] [address]    Report Status 08/31/2019 FINAL  Final  SARS Coronavirus 2 by RT PCR (hospital order, performed in [facility] hospital lab) Nasopharyngeal Nasopharyngeal Swab     Status: None   Collection Time: 08/26/19  1:13 PM   Specimen: Nasopharyngeal Swab  Result Value Ref Range Status   SARS Coronavirus 2 NEGATIVE NEGATIVE Final    Comment: (NOTE) SARS-CoV-2 target nucleic acids are NOT DETECTED.  The SARS-CoV-2 RNA is generally detectable in upper and lower respiratory specimens during the acute phase of infection. The lowest concentration of SARS-CoV-2 viral copies this assay can detect is 250 copies / mL. A negative result does not preclude SARS-CoV-2 infection and should not be used as the sole basis for treatment or other patient management decisions.  A negative result may occur with improper specimen collection / handling, submission of specimen other than nasopharyngeal swab, presence of viral mutation(s) within the areas targeted by this assay, and inadequate number of viral copies (<250 copies / mL). A negative result must be combined with clinical observations, patient history, and epidemiological information.  Fact Sheet for Patients:    StrictlyIdeas.no  Fact Sheet for Healthcare Providers: BankingDealers.co.za  This test is not yet approved or  cleared by the [place] FDA and has been authorized for detection and/or diagnosis of SARS-CoV-2 by FDA under an Emergency Use Authorization (EUA).  This EUA will remain in effect (meaning this test can be used) for the duration of the COVID-19 declaration under Section 564(b)(1) of the Act, 21 U.S.C. section 360bbb-3(b)(1), unless the authorization is terminated or revoked sooner.  Performed at [facility], [place] [address] [place], [place] [address]   Surgical pcr screen     Status: None   Collection Time: 08/27/19 10:01 AM   Specimen: Nasal Mucosa; Nasal Swab  Result Value Ref Range Status   MRSA, PCR NEGATIVE NEGATIVE Final   Staphylococcus aureus NEGATIVE NEGATIVE Final    Comment: (NOTE) The Xpert SA Assay (FDA approved for NASAL specimens in patients 50 years of age and older), is one component of a comprehensive surveillance program. It is not intended to diagnose infection nor to guide or monitor treatment. Performed at [facility], [place] [address], [place], [place] [address]          Radiology Studies: DG Knee 1-2 Views Right  Result Date: 08/30/2019 CLINICAL DATA:  Retained orthopedic hardware EXAM: RIGHT KNEE - 1-2 VIEW COMPARISON:  None. FINDINGS: No acute fracture or dislocation. No aggressive osseous lesion. Mild medial femorotibial compartment joint space narrowing. Right tibial intramedullary nail. Mild soft tissue edema in the subcutaneous fat. IMPRESSION: Right tibial intramedullary nail partially visualized. Electronically Signed   By: Kathreen Devoid   On: 08/30/2019 10:59   Scheduled Meds: .  aspirin EC  81 mg Oral Daily  . citalopram  20 mg Oral Daily  . docusate sodium  100 mg Oral BID  . enoxaparin (LOVENOX) injection  60 mg Subcutaneous Q24H  . insulin aspart  0-15 Units  Subcutaneous TID WC  . insulin aspart  0-5 Units Subcutaneous QHS  . sodium chloride flush  3 mL Intravenous Once  . sodium chloride flush  3 mL Intravenous Q12H  . traMADol  50 mg Oral Q6H  . verapamil  240 mg Oral Q breakfast   Continuous Infusions: . sodium chloride 125 mL/hr at 08/26/19 1750  . sodium chloride 125 mL/hr at 08/28/19 1732  . [START ON 09/01/2019]  ceFAZolin (ANCEF) IV    . cefTRIAXone (ROCEPHIN)  IV 2 g (08/30/19 2201)  . lactated ringers 10 mL/hr at 08/27/19 1354  . methocarbamol (ROBAXIN) IV    . metronidazole 500 mg (08/31/19 1327)     LOS: 5 days    Time spent:  25 mins.    Shawna Clamp, MD [facility]   If 7PM-7AM, please contact night-coverage

## 2019-08-31 NOTE — Progress Notes (Signed)
During bedside report, Day shift Nurse reported that RLE wound vac tubing  Dislodged and is currently out( waiting for delivery of replacement wound vac supplies). Per day shift nurse, surgeon is aware and rapid response evaluated the patient.   Noted that the RLE wound vac site was OTA. I covered with 2 sterile 4X4's and secured with gauze. Noted patient is on OR schedule for 09/01/19 at 18:54 pm with Dr. Lajoyce Corners.

## 2019-08-31 NOTE — Telephone Encounter (Signed)
Please cancel VVS consult

## 2019-08-31 NOTE — Telephone Encounter (Signed)
I called and lm on vm to advise that referral is no longer needed and to call with any questions.

## 2019-08-31 NOTE — H&P (View-Only) (Signed)
Patient ID: [name], male   DOB: 06/01/1960, 58 y.o.   MRN: [id number] Patient is a 58-year-old gentleman who is status post foot salvage intervention with a massive gangrenous ulcer to the right heel that underwent partial calcaneal excision wound closure and placement of wound VAC.  Patient states that the wound VAC pulled off at home he followed up with Dr.Xu and had recurrent infection despite oral antibiotics.  Patient underwent a guillotine amputation through the ankle a wound VAC was placed.  Patient states the wound VAC pulled off while he was doing therapy.  Current examination at this time shows no ascending cellulitis the calf is soft nontender with good hair growth.  Discussed with the patient and his wife the optimal treatment would be to proceed with a transtibial amputation with application of incisional wound VAC.  Discussed the importance of maintaining the wound VAC to ensure tissue healing.  Will contact with [employer] for stump shrinker and limb protector. 

## 2019-08-31 NOTE — Telephone Encounter (Signed)
Cx referral for VVS? Please see message below.

## 2019-08-31 NOTE — Plan of Care (Signed)

## 2019-08-31 NOTE — Progress Notes (Signed)
Patient ID: LOYDE ORTH, male   DOB: 09/11/1960, 59 y.o.   MRN: [id number] Patient is a 59 year old gentleman who is status post foot salvage intervention with a massive gangrenous ulcer to the right heel that underwent partial calcaneal excision wound closure and placement of wound VAC.  Patient states that the wound VAC pulled off at home he followed up with Dr.Xu and had recurrent infection despite oral antibiotics.  Patient underwent a guillotine amputation through the ankle a wound VAC was placed.  Patient states the wound VAC pulled off while he was doing therapy.  Current examination at this time shows no ascending cellulitis the calf is soft nontender with good hair growth.  Discussed with the patient and his wife the optimal treatment would be to proceed with a transtibial amputation with application of incisional wound VAC.  Discussed the importance of maintaining the wound VAC to ensure tissue healing.  Will contact with [employer] for stump shrinker and limb protector.

## 2019-09-01 ENCOUNTER — Inpatient Hospital Stay (HOSPITAL_COMMUNITY): Payer: BC Managed Care – PPO | Admitting: Anesthesiology

## 2019-09-01 ENCOUNTER — Encounter (HOSPITAL_COMMUNITY)
Admission: EM | Disposition: A | Discharge status: Rehabilitation Facility | Payer: Self-pay | Source: Home / Self Care | Attending: Family Medicine

## 2019-09-01 ENCOUNTER — Inpatient Hospital Stay (HOSPITAL_COMMUNITY): Payer: BC Managed Care – PPO

## 2019-09-01 DIAGNOSIS — M86171 Other acute osteomyelitis, right ankle and foot: Secondary | ICD-10-CM

## 2019-09-01 DIAGNOSIS — M869 Osteomyelitis, unspecified: Secondary | ICD-10-CM

## 2019-09-01 DIAGNOSIS — Z969 Presence of functional implant, unspecified: Secondary | ICD-10-CM

## 2019-09-01 HISTORY — PX: AMPUTATION: SHX166

## 2019-09-01 LAB — GLUCOSE, CAPILLARY
Glucose-Capillary: 117 mg/dL — ABNORMAL HIGH (ref 70–99)
Glucose-Capillary: 109 mg/dL — ABNORMAL HIGH (ref 70–99)
Glucose-Capillary: 102 mg/dL — ABNORMAL HIGH (ref 70–99)
Glucose-Capillary: 99 mg/dL (ref 70–99)
Glucose-Capillary: 112 mg/dL — ABNORMAL HIGH (ref 70–99)

## 2019-09-01 LAB — CBC
HCT: 36.2 % — ABNORMAL LOW (ref 39.0–52.0)
Hemoglobin: 11.8 g/dL — ABNORMAL LOW (ref 13.0–17.0)
MCH: 30.9 pg (ref 26.0–34.0)
MCHC: 32.6 g/dL (ref 30.0–36.0)
MCV: 94.8 fL (ref 80.0–100.0)
Platelets: 292 10*3/uL (ref 150–400)
RBC: 3.82 MIL/uL — ABNORMAL LOW (ref 4.22–5.81)
RDW: 14 % (ref 11.5–15.5)
WBC: 6.7 10*3/uL (ref 4.0–10.5)
nRBC: 0 % (ref 0.0–0.2)

## 2019-09-01 LAB — BASIC METABOLIC PANEL
Anion gap: 8 (ref 5–15)
BUN: 8 mg/dL (ref 6–20)
CO2: 26 mmol/L (ref 22–32)
Calcium: 8.2 mg/dL — ABNORMAL LOW (ref 8.9–10.3)
Chloride: 101 mmol/L (ref 98–111)
Creatinine, Ser: 0.58 mg/dL — ABNORMAL LOW (ref 0.61–1.24)
GFR calc Af Amer: >60 mL/min (ref >60)
GFR calc non Af Amer: >60 mL/min (ref >60)
Glucose, Bld: 110 mg/dL — ABNORMAL HIGH (ref 70–99)
Potassium: 4.2 mmol/L (ref 3.5–5.1)
Sodium: 135 mmol/L (ref 135–145)

## 2019-09-01 SURGERY — AMPUTATION BELOW KNEE
Anesthesia: Regional | Site: Knee | Laterality: Right

## 2019-09-01 MED ORDER — BUPIVACAINE LIPOSOME 1.3 % IJ SUSP
INTRAMUSCULAR | Status: DC | PRN
  Administered 2019-09-01: 10 mL via PERINEURAL

## 2019-09-01 MED ORDER — MIDAZOLAM HCL 2 MG/2ML IJ SOLN: 2.0000 mg | Freq: Once | INTRAMUSCULAR | Status: AC

## 2019-09-01 MED ORDER — FENTANYL CITRATE (PF) 250 MCG/5ML IJ SOLN
INTRAMUSCULAR | Status: DC | PRN
  Administered 2019-09-01: 50 ug via INTRAVENOUS

## 2019-09-01 MED ORDER — FENTANYL CITRATE (PF) 250 MCG/5ML IJ SOLN
INTRAMUSCULAR | Status: AC
  Filled 2019-09-01: qty 5

## 2019-09-01 MED ORDER — PROMETHAZINE HCL 25 MG/ML IJ SOLN: 6.2500 mg | INTRAMUSCULAR | Status: DC | PRN

## 2019-09-01 MED ORDER — FENTANYL CITRATE (PF) 100 MCG/2ML IJ SOLN: 50.0000 ug | Freq: Once | INTRAMUSCULAR | Status: AC

## 2019-09-01 MED ORDER — BUPIVACAINE HCL (PF) 0.25 % IJ SOLN
INTRAMUSCULAR | Status: DC | PRN
  Administered 2019-09-01: 30 mL

## 2019-09-01 MED ORDER — CHLORHEXIDINE GLUCONATE 0.12 % MT SOLN
15.0000 mL | Freq: Once | OROMUCOSAL | Status: AC
  Filled 2019-09-01: qty 15

## 2019-09-01 MED ORDER — PROPOFOL 10 MG/ML IV BOLUS
INTRAVENOUS | Status: DC | PRN
  Administered 2019-09-01: 200 mg via INTRAVENOUS

## 2019-09-01 MED ORDER — MIDAZOLAM HCL 2 MG/2ML IJ SOLN
INTRAMUSCULAR | Status: AC
  Administered 2019-09-01: 2 mg via INTRAVENOUS
  Filled 2019-09-01: qty 2

## 2019-09-01 MED ORDER — MIDAZOLAM HCL 2 MG/2ML IJ SOLN
INTRAMUSCULAR | Status: AC
  Filled 2019-09-01: qty 2

## 2019-09-01 MED ORDER — FENTANYL CITRATE (PF) 100 MCG/2ML IJ SOLN
INTRAMUSCULAR | Status: AC
  Administered 2019-09-01: 50 ug via INTRAVENOUS
  Filled 2019-09-01: qty 2

## 2019-09-01 MED ORDER — HYDROMORPHONE HCL 1 MG/ML IJ SOLN
0.2500 mg | INTRAMUSCULAR | Status: DC | PRN
Start: 2019-09-01 — End: 2019-09-01

## 2019-09-01 MED ORDER — CHLORHEXIDINE GLUCONATE 0.12 % MT SOLN
OROMUCOSAL | Status: AC
  Administered 2019-09-01: 15 mL via OROMUCOSAL
  Filled 2019-09-01: qty 15

## 2019-09-01 MED ORDER — 0.9 % SODIUM CHLORIDE (POUR BTL) OPTIME
TOPICAL | Status: DC | PRN
  Administered 2019-09-01: 1000 mL

## 2019-09-01 MED ORDER — PROPOFOL 10 MG/ML IV BOLUS
INTRAVENOUS | Status: AC
  Filled 2019-09-01: qty 20

## 2019-09-01 MED ORDER — SODIUM CHLORIDE 0.9 % IV SOLN: INTRAVENOUS | Status: DC

## 2019-09-01 MED ORDER — OXYCODONE HCL 5 MG PO TABS: 5.0000 mg | ORAL_TABLET | Freq: Once | ORAL | Status: DC | PRN

## 2019-09-01 MED ORDER — OXYCODONE HCL 5 MG/5ML PO SOLN: 5.0000 mg | Freq: Once | ORAL | Status: DC | PRN

## 2019-09-01 MED ORDER — PHENYLEPHRINE 40 MCG/ML (10ML) SYRINGE FOR IV PUSH (FOR BLOOD PRESSURE SUPPORT)
PREFILLED_SYRINGE | INTRAVENOUS | Status: DC | PRN
  Administered 2019-09-01: 80 ug via INTRAVENOUS
  Administered 2019-09-01: 40 ug via INTRAVENOUS

## 2019-09-01 SURGICAL SUPPLY — 40 items
BLADE SAW RECIP 87.9 MT (BLADE) ×3 IMPLANT
BLADE SURG 21 STRL SS (BLADE) ×3 IMPLANT
BNDG COHESIVE 6X5 TAN STRL LF (GAUZE/BANDAGES/DRESSINGS) IMPLANT
CANISTER WOUND CARE 500ML ATS (WOUND CARE) ×3 IMPLANT
CANISTER WOUNDNEG PRESSURE 500 (CANNISTER) ×2 IMPLANT
COVER SURGICAL LIGHT HANDLE (MISCELLANEOUS) ×3 IMPLANT
COVER WAND RF STERILE (DRAPES) IMPLANT
CUFF TOURN SGL QUICK 34 (TOURNIQUET CUFF) ×3
CUFF TRNQT CYL 34X4.125X (TOURNIQUET CUFF) ×1 IMPLANT
DRAPE INCISE IOBAN 66X45 STRL (DRAPES) ×3 IMPLANT
DRAPE U-SHAPE 47X51 STRL (DRAPES) ×3 IMPLANT
DRESSING PREVENA PLUS CUSTOM (GAUZE/BANDAGES/DRESSINGS) ×1 IMPLANT
DRSG PREVENA PLUS CUSTOM (GAUZE/BANDAGES/DRESSINGS) ×6
DURAPREP 26ML APPLICATOR (WOUND CARE) ×3 IMPLANT
ELECT REM PT RETURN 9FT ADLT (ELECTROSURGICAL) ×3
ELECTRODE REM PT RTRN 9FT ADLT (ELECTROSURGICAL) ×1 IMPLANT
GLOVE BIOGEL PI IND STRL 9 (GLOVE) ×1 IMPLANT
GLOVE BIOGEL PI INDICATOR 9 (GLOVE) ×2
GLOVE SURG ORTHO 9.0 STRL STRW (GLOVE) ×3 IMPLANT
GOWN STRL REUS W/ TWL XL LVL3 (GOWN DISPOSABLE) ×2 IMPLANT
GOWN STRL REUS W/TWL XL LVL3 (GOWN DISPOSABLE) ×6
KIT BASIN OR (CUSTOM PROCEDURE TRAY) ×3 IMPLANT
KIT DRSG PREVENA PLUS 7DAY 125 (MISCELLANEOUS) ×2 IMPLANT
KIT TURNOVER KIT B (KITS) ×3 IMPLANT
MANIFOLD NEPTUNE II (INSTRUMENTS) ×3 IMPLANT
NS IRRIG 1000ML POUR BTL (IV SOLUTION) ×3 IMPLANT
PACK ORTHO EXTREMITY (CUSTOM PROCEDURE TRAY) ×3 IMPLANT
PAD ARMBOARD 7.5X6 YLW CONV (MISCELLANEOUS) ×3 IMPLANT
PREVENA RESTOR ARTHOFORM 46X30 (CANNISTER) ×3 IMPLANT
SPONGE LAP 18X18 RF (DISPOSABLE) IMPLANT
STAPLER VISISTAT 35W (STAPLE) ×2 IMPLANT
STOCKINETTE IMPERVIOUS LG (DRAPES) ×3 IMPLANT
SUT ETHILON 2 0 PSLX (SUTURE) IMPLANT
SUT SILK 2 0 (SUTURE) ×6
SUT SILK 2-0 18XBRD TIE 12 (SUTURE) ×1 IMPLANT
SUT VIC AB 1 CTX 27 (SUTURE) ×4 IMPLANT
TOWEL GREEN STERILE (TOWEL DISPOSABLE) ×3 IMPLANT
TUBE CONNECTING 12'X1/4 (SUCTIONS) ×1
TUBE CONNECTING 12X1/4 (SUCTIONS) ×2 IMPLANT
YANKAUER SUCT BULB TIP NO VENT (SUCTIONS) ×3 IMPLANT

## 2019-09-01 NOTE — Op Note (Signed)
09/01/2019  5:52 PM  PATIENT:  [name]    PRE-OPERATIVE DIAGNOSIS:  Gangrene Right Foot  POST-OPERATIVE DIAGNOSIS: Gangrene right foot with abscess extending to mid calf with retained intramedullary nail from 1985  PROCEDURE:  RIGHT BELOW KNEE AMPUTATION Removal of deep retained hardware IM nail. Application of Prevena wound VAC customizable and arthroform. Tissue and abscess sent for cultures  SURGEON:  Nadara Mustard, MD  PHYSICIAN ASSISTANT:None ANESTHESIA:   General  PREOPERATIVE INDICATIONS:  [name] is a  59 y.o. male with a diagnosis of Gangrene Right Foot who failed conservative measures and elected for surgical management.    The risks benefits and alternatives were discussed with the patient preoperatively including but not limited to the risks of infection, bleeding, nerve injury, cardiopulmonary complications, the need for revision surgery, among others, and the patient was willing to proceed.  OPERATIVE IMPLANTS: Praveena wound VAC customizable and Arthur form  @ENCIMAGES @  OPERATIVE FINDINGS: Abscess from the foot extended up to the mid calf associated soft tissue was excised the abscess was sent for cultures.  OPERATIVE PROCEDURE: Patient was brought the operating room and underwent a general anesthetic after regional block.  After adequate levels anesthesia were obtained patient's right lower extremity was prepped using DuraPrep draped into a sterile field a timeout was called the foot was draped out of the sterile field with impervious stockinette.  A transverse incision was made through the skin 11 cm distal to the tibial tubercle the scar approximately a large posterior flap was created.  The anterior bundle was clamped and suture ligated with 2-0 silk.  The tibia was transected 1 cm proximal to the skin incision the fibula was transected just proximal to the tibial incision.  The tibial incision was beveled anteriorly.  A segment of the tibia was removed  distal to this cut to allow for removal of the nail.  Using a bone tamp and a hammer the bone tamp was placed against the distal tibia and the intramedullary rod was removed without complications.  The posterior flap was created with a 21 blade knife the sciatic nerve was pulled cut and allowed to retract.  The vascular bundles were suture ligated with 2-0 silk x2.  The tourniquet was deflated hemostasis was obtained.  The deep and superficial fascia layers were closed using #1 Vicryl skin was closed using staples a Praveena customizable and Arthur form wound VAC were applied this had a good suction fit patient was extubated taken the PACU in stable condition.   DISCHARGE PLANNING:  Antibiotic duration: Recommend continuing IV antibiotics until cultures are finalized.  Would then continue oral antibiotics for 2 weeks  Weightbearing: Nonweightbearing on the right  Pain medication: Opioid pathway  Dressing care/ Wound VAC: Continue wound VAC for 1 week  Ambulatory devices: Walker  Discharge to: Anticipate discharge to inpatient rehab versus outpatient rehab.  [employer] to provide stump protector and stump shrinker. Cultures pending for abscess in the mid tibia.  Follow-up: In the office 1 week post operative.

## 2019-09-01 NOTE — Progress Notes (Signed)
PT Cancellation Note  Patient Details Name: [name] MRN: [id number] DOB: May 28, 1960   Cancelled Treatment:    Reason Eval/Treat Not Completed: Patient declined, no reason specified (pt refused therapy for rest prior to surgical procedure) Pt motivated to get moving and OOB once surgery is complete.    Harmon Pier, SPT  Acute Rehabilitation Services  Office: [phone]  09/01/2019, 1:54 PM

## 2019-09-01 NOTE — Anesthesia Procedure Notes (Signed)
Anesthesia Regional Block: Adductor canal block   Pre-Anesthetic Checklist: ,, timeout performed, Correct Patient, Correct Site, Correct Laterality, Correct Procedure, Correct Position, site marked, Risks and benefits discussed,  Surgical consent,  Pre-op evaluation,  At surgeon's request and post-op pain management  Laterality: Right  Prep: Maximum Sterile Barrier Precautions used, chloraprep       Needles:  Injection technique: Single-shot  Needle Type: Echogenic Stimulator Needle     Needle Length: 9cm  Needle Gauge: 22     Additional Needles:   Procedures:,,,, ultrasound used (permanent image in chart),,,,  Narrative:  Start time: 09/01/2019 4:00 PM End time: 09/01/2019 4:05 PM Injection made incrementally with aspirations every 5 mL.  Performed by: Personally  Anesthesiologist: Lannie Fields, DO  Additional Notes: Monitors applied. No increased pain on injection. No increased resistance to injection. Injection made in 5cc increments. Good needle visualization. Patient tolerated procedure well.

## 2019-09-01 NOTE — Anesthesia Procedure Notes (Signed)
Anesthesia Regional Block: Popliteal block   Pre-Anesthetic Checklist: ,, timeout performed, Correct Patient, Correct Site, Correct Laterality, Correct Procedure, Correct Position, site marked, Risks and benefits discussed,  Surgical consent,  Pre-op evaluation,  At surgeon's request and post-op pain management  Laterality: Right  Prep: Maximum Sterile Barrier Precautions used, chloraprep       Needles:  Injection technique: Single-shot  Needle Type: Echogenic Stimulator Needle     Needle Length: 9cm  Needle Gauge: 22     Additional Needles:   Procedures:,,,, ultrasound used (permanent image in chart),,,,  Narrative:  Start time: 09/01/2019 4:05 PM End time: 09/01/2019 4:10 PM Injection made incrementally with aspirations every 5 mL.  Performed by: Personally  Anesthesiologist: Lannie Fields, DO  Additional Notes: Monitors applied. No increased pain on injection. No increased resistance to injection. Injection made in 5cc increments. Good needle visualization. Patient tolerated procedure well.

## 2019-09-01 NOTE — Plan of Care (Signed)

## 2019-09-01 NOTE — Interval H&P Note (Signed)
History and Physical Interval Note:  09/01/2019 7:15 AM  [name]  has presented today for surgery, with the diagnosis of Gangrene Right Foot.  The various methods of treatment have been discussed with the patient and family. After consideration of risks, benefits and other options for treatment, the patient has consented to  Procedure(s): RIGHT BELOW KNEE AMPUTATION (Right) as a surgical intervention.  The patient's history has been reviewed, patient examined, no change in status, stable for surgery.  I have reviewed the patient's chart and labs.  Questions were answered to the patient's satisfaction.     Nadara Mustard

## 2019-09-01 NOTE — Anesthesia Procedure Notes (Deleted)
Performed by: Sheppard Luckenbach A, CRNA       

## 2019-09-01 NOTE — Anesthesia Preprocedure Evaluation (Addendum)
Anesthesia Evaluation  Patient identified by MRN, date of birth, ID band Patient awake    Reviewed: Allergy & Precautions, NPO status , Patient's Chart, lab work & pertinent test results  Airway Mallampati: III  TM Distance: >3 FB Neck ROM: Full    Dental  (+) Poor Dentition, Dental Advisory Given   Pulmonary COPD, Current Smoker and Patient abstained from smoking.,    Pulmonary exam normal breath sounds clear to auscultation       Cardiovascular hypertension, Pt. on medications Normal cardiovascular exam Rhythm:Regular Rate:Normal     Neuro/Psych negative neurological ROS  negative psych ROS   GI/Hepatic negative GI ROS, (+)     substance abuse  marijuana use, Hepatitis -, CHCV- treated   Endo/Other  diabetes, Well Controlled, Type 2, Oral Hypoglycemic Agentsa1c 6.0 Obesity BMI 33  Renal/GU Renal disease  negative genitourinary   Musculoskeletal negative musculoskeletal ROS (+)   Abdominal (+) + obese,   Peds negative pediatric ROS (+)  Hematology  (+) Blood dyscrasia, anemia , hct 35   Anesthesia Other Findings   Reproductive/Obstetrics negative OB ROS                            Anesthesia Physical Anesthesia Plan  ASA: III  Anesthesia Plan: General and Regional   Post-op Pain Management: GA combined w/ Regional for post-op pain   Induction: Intravenous  PONV Risk Score and Plan: 1 and Ondansetron, Dexamethasone and Treatment may vary due to age or medical condition  Airway Management Planned: LMA  Additional Equipment: None  Intra-op Plan:   Post-operative Plan: Extubation in OR  Informed Consent: I have reviewed the patients History and Physical, chart, labs and discussed the procedure including the risks, benefits and alternatives for the proposed anesthesia with the patient or authorized representative who has indicated his/her understanding and acceptance.      Dental advisory given  Plan Discussed with: CRNA  Anesthesia Plan Comments:        Anesthesia Quick Evaluation

## 2019-09-01 NOTE — Anesthesia Procedure Notes (Addendum)
Procedure Name: LMA Insertion Date/Time: 09/01/2019 5:09 PM Performed by: Adair Laundry, CRNA Pre-anesthesia Checklist: Patient identified, Emergency Drugs available, Suction available and Patient being monitored Patient Re-evaluated:Patient Re-evaluated prior to induction Preoxygenation: Pre-oxygenation with 100% oxygen Induction Type: IV induction Ventilation: Mask ventilation without difficulty LMA: LMA inserted LMA Size: 5.0 Dental Injury: Teeth and Oropharynx as per pre-operative assessment

## 2019-09-01 NOTE — Transfer of Care (Signed)
Immediate Anesthesia Transfer of Care Note  Patient: [name]  Procedure(s) Performed: RIGHT BELOW KNEE AMPUTATION (Right Knee)  Patient Location: PACU  Anesthesia Type:General  Level of Consciousness: alert  and patient cooperative  Airway & Oxygen Therapy: Patient Spontanous Breathing and Patient connected to nasal cannula oxygen  Post-op Assessment: Report given to RN and Post -op Vital signs reviewed and stable  Post vital signs: Reviewed and stable  Last Vitals:  Vitals Value Taken Time  BP 124/68 09/01/19 1747  Temp    Pulse 65 09/01/19 1747  Resp 17 09/01/19 1747  SpO2 96 % 09/01/19 1747  Vitals shown include unvalidated device data.  Last Pain:  Vitals:   09/01/19 1012  TempSrc:   PainSc: Asleep      Patients Stated Pain Goal: 3 (08/31/19 2057)  Complications: No complications documented.

## 2019-09-01 NOTE — Progress Notes (Signed)
PROGRESS NOTE    [name]  SFK:[id number] DOB: [birth year] DOA: 08/26/2019 PCP: [name], MD   Brief Narrative:  [name] a 59 y.o.malewith medical history significant ofhypertension, type 2 diabetes mellitus,  multiple other comorbidities including diagnosis of osteomyelitis of right calcaneus and abscess of the right calcaneus is status post incision and drainage done on 08/20/2019 by [name] on outpatient basis and discharged home the same day presented back to ED with the complaint of worsening pain and swelling of the right heel. According to patient, he was discharged home with no antibiotics however wound VAC was attached. Wound VAC was dislodged on Sunday night by accident. He followed up with Dr. [name] office on Monday and was seen by PA and was told that his wound looked better. Subsequently, yesterday he received a call from Dr. [name] PA that his culture from the abscess is growing bacteria so he was prescribed Omnicef which he took last night and this morning however he started having worsening redness, pain and swelling of the right foot along with chills fever and sweating yesterday which persisted today so he came to the emergency department. T-max was 102. Other than that he does not have any other complaint.In [facility] had some tachypnea as well as tachycardia. Has significant leukocytosis with white cells of 26.6as compared to normal last week. He was diagnosed with severe sepsis based on tachycardia, tachypnea, leukocytosis and lactic acid of 2.2. He received some antibiotics in the ED. Orthopedic on call/Dr. [name] consulted and admission was deferred to hospital service.Covid swab negative - patient is unvaccinated. Patient is admitted for severe sepsis secondary to right calcaneal cellulitis versus osteomyelitis.  Patient is scheduled to have right below-knee amputation today by orthopedics.  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  -MRI showed soft tissue ulcer without evidence of osteomyelitis or abscess -Orthopedic surgery following in consult, -POD 2 SYME amp - c/w abx and wound vac,   - scheduled to have Rt. BKA by Dr [name] on wed, 8/11. - Pt lives with wife-has good social support -Continue IV fluids,changed abx from zosyn vanc to ctx and flagyl. - Continue to follow cultures, initial culture from abscess growing gram-positive cocci and gram-negative rods;blood culturesneg.  Essential hypertension: -Home medications includelisinopril, hydrochlorothiazide and verapamil. -Continue verapamil only, will restart other medications as BP allows.  Hyponatremia, chronic: -Baseline around 133, currently within normal limits. -Continue to hold HCTZ.  Type 2 diabetes mellitus with polyneuropathy, borderline control: Hold home medications, continue sliding scale insulin, hypoglycemic protocol. Discussed need for medication and dietary compliance at length at bedside  DVT prophylaxis: Lovenox Code Status: Full Family Communication: Wife was at bedside, discussed with patient in detail Disposition Plan:  Dispo: The patient is from: Home  Anticipated d/c is to: CIR  Anticipated d/c date is: > 3 days.  Patient currently is not medically stable to d/c.  Ongoing plan for [name]   Consultants:   Orthopeadics.  Procedures:  Antimicrobials:  Anti-infectives (From admission, onward)   Start      Dose/Rate Route Frequency Ordered Stop  09/01/19 0600  ceFAZolin (ANCEF) 3 g in dextrose 5 % 50 mL IVPB     Discontinue     3 g 100 mL/hr over 30 Minutes Intravenous On call to O.R. 08/31/19 0941 09/02/19 0559   08/28/19 2200  cefTRIAXone (ROCEPHIN) 2 g in sodium chloride 0.9 % 100 mL IVPB     Discontinue     2 g 200 mL/hr over 30 Minutes Intravenous Every 24 hours 08/28/19 1340     08/28/19 2200  metroNIDAZOLE (FLAGYL) IVPB 500 mg     Discontinue     500 mg 100 mL/hr over 60 Minutes Intravenous Every 8 hours 08/28/19 1340     08/27/19 0200  vancomycin (VANCOREADY) IVPB 750 mg/150 mL  Status:  Discontinued        750 mg 150 mL/hr over 60 Minutes Intravenous Every 12 hours 08/26/19 1231 08/28/19 1340   08/26/19 2200  piperacillin-tazobactam (ZOSYN) IVPB 3.375 g  Status:  Discontinued        3.375 g 12.5 mL/hr over 240 Minutes Intravenous Every 8 hours 08/26/19 1412 08/28/19 1340   08/26/19 1345  vancomycin (VANCOCIN) IVPB 1000 mg/200 mL premix  Status:  Discontinued        1,000 mg 200 mL/hr over 60 Minutes Intravenous  Once 08/26/19 1331 08/26/19 1352   08/26/19 1345  piperacillin-tazobactam (ZOSYN) IVPB 3.375 g        3.375 g 100 mL/hr over 30 Minutes Intravenous  Once 08/26/19 1331 08/26/19 1701   08/26/19 1345  vancomycin (VANCOCIN) IVPB 1000 mg/200 mL premix  Status:  Discontinued        1,000 mg 200 mL/hr over 60 Minutes Intravenous  Once 08/26/19 1331 08/26/19 1352   08/26/19 1145  vancomycin (VANCOCIN) IVPB 1000 mg/200 mL premix  Status:  Discontinued        1,000 mg 200 mL/hr over 60 Minutes Intravenous  Once 08/26/19 1138 08/26/19 1139   08/26/19 1145  cefTRIAXone (ROCEPHIN) 2 g in sodium chloride 0.9 % 100 mL IVPB        2 g 200 mL/hr over 30 Minutes Intravenous  Once 08/26/19 1138 08/26/19 1342   08/26/19 1145  vancomycin (VANCOREADY) IVPB 2000 mg/400 mL        2,000 mg 200 mL/hr over 120 Minutes Intravenous  Once 08/26/19 1139 08/26/19 1629     Subjective: Patient  was seen and examined at bedside.  He has participated in physical therapy.  No overnight events.  He reports pain is better controlled.  He is anticipating below-knee amputation today.  Objective: Vitals:   08/31/19 1438 08/31/19 2009 09/01/19 0407 09/01/19 0758  BP: 118/64 117/78 (!) 109/52 127/71  Pulse: 66 68 61 (!) 59  Resp: _0 Temp: 97.9 F (36.6 C) 98.2 F (36.8 C) 98 F (36.7 C) 97.6 F (36.4 C)  TempSrc: Oral Oral Oral Oral  SpO2: 100% 98% 96% 97%  Weight:      Height:        Intake/Output Summary (Last 24 hours) at 09/01/2019 1537 Last data filed at 09/01/2019 0800 Gross per 24 hour  Intake 240 ml  Output 1600 ml  Net -1360 ml   Filed Weights   08/26/19 0942 08/26/19 1527 08/27/19 1350  Weight: 117.9 kg 119.9 kg 119.9 kg    Examination:  General exam: Appears calm and comfortable  Respiratory system: Clear to auscultation. Respiratory effort normal. Cardiovascular system: S1 & S2 heard, RRR. No JVD, murmurs, rubs, gallops or clicks. No  pedal edema. Gastrointestinal system: Abdomen is nondistended, soft and nontender. No organomegaly or masses felt. Normal bowel sounds heard. Central nervous system: Alert and oriented. No focal neurological deficits. Extremities: Right Above ankle amputation. Skin: No rashes, lesions or ulcers Psychiatry: Judgement and insight appear normal. Mood & affect appropriate.     Data Reviewed: I have personally reviewed following labs and imaging studies  CBC: Recent Labs  Lab 08/26/19 1001 08/27/19 0455 08/28/19 0935 08/29/19 1235 08/30/19 0238 08/31/19 0325 09/01/19 1046  WBC 26.6*   < > 16.7* 13.3* 12.2* 10.8* 6.7  NEUTROABS 23.9*  --  13.6* 9.9* 8.5*  --   --   HGB 14.3   < > 11.9* 11.6* 11.0* 11.3* 11.8*  HCT 42.2   < > 36.0* 35.7* 33.7* 35.0* 36.2*  MCV 91.9   < > 94.7 95.2 94.4 95.1 94.8  PLT 267   < > 185 235 289 285 292   < > = values in this interval not displayed.   Basic Metabolic Panel: Recent  Labs  Lab 08/26/19 1001 08/26/19 1537 08/27/19 0455 08/28/19 0935 08/30/19 0238 08/31/19 0325 09/01/19 1046  NA   < >  --  130* 132* 133* 131* 135  K   < >  --  3.8 4.6 4.1 4.0 4.2  CL   < >  --  99 103 103 100 101  CO2   < >  --  19* 20* 20* 20* 26  GLUCOSE   < >  --  95 97 109* 129* 110*  BUN   < >  --  18 23* _0 CREATININE   < >  --  0.89 0.83 0.68 0.57* 0.58*  CALCIUM   < >  --  8.4* 8.1* 8.1* 8.0* 8.2*  MG  --  1.8  --   --   --   --   --    < > = values in this interval not displayed.   GFR: Estimated Creatinine Clearance: 137.1 mL/min (A) (by C-G formula based on SCr of 0.58 mg/dL (L)). Liver Function Tests: Recent Labs  Lab 08/26/19 1001  AST 79*  ALT 67*  ALKPHOS 90  BILITOT 1.1  PROT 6.9  ALBUMIN 2.0*   No results for input(s): LIPASE, AMYLASE in the last 168 hours. No results for input(s): AMMONIA in the last 168 hours. Coagulation Profile: Recent Labs  Lab 08/26/19 1237 08/27/19 0455  INR 1.3* 1.4*   Cardiac Enzymes: No results for input(s): CKTOTAL, CKMB, CKMBINDEX, TROPONINI in the last 168 hours. BNP (last 3 results) No results for input(s): PROBNP in the last 8760 hours. HbA1C: No results for input(s): HGBA1C in the last 72 hours. CBG: Recent Labs  Lab 08/31/19 1628 08/31/19 2033 09/01/19 0653 09/01/19 1139 09/01/19 1329  GLUCAP 133* 104* 117* 109* 102*   Lipid Profile: No results for input(s): CHOL, HDL, LDLCALC, TRIG, CHOLHDL, LDLDIRECT in the last 72 hours. Thyroid Function Tests: No results for input(s): TSH, T4TOTAL, FREET4, T3FREE, THYROIDAB in the last 72 hours. Anemia Panel: No results for input(s): VITAMINB12, FOLATE, FERRITIN, TIBC, IRON, RETICCTPCT in the last 72 hours. Sepsis Labs: Recent Labs  Lab 08/26/19 1001 08/26/19 1237 08/26/19 1627 08/26/19 1925 08/27/19 0455  PROCALCITON  --   --   --   --  2.67  LATICACIDVEN 2.3* 2.8* 3.3* 1.7  --     Recent Results (from the past 240 hour(s))  Blood Culture  (routine x 2)  Status: None   Collection Time: 08/26/19 12:37 PM   Specimen: BLOOD RIGHT ARM  Result Value Ref Range Status   Specimen Description BLOOD RIGHT ARM  Final   Special Requests   Final    BOTTLES DRAWN AEROBIC ONLY Blood Culture results may not be optimal due to an inadequate volume of blood received in culture bottles   Culture   Final    NO GROWTH 5 DAYS Performed at [facility], [place] [address] [place], [place] [address]    Report Status 08/31/2019 FINAL  Final  Blood Culture (routine x 2)     Status: None   Collection Time: 08/26/19  1:00 PM   Specimen: BLOOD RIGHT FOREARM  Result Value Ref Range Status   Specimen Description BLOOD RIGHT FOREARM  Final   Special Requests   Final    BOTTLES DRAWN AEROBIC ONLY Blood Culture results may not be optimal due to an inadequate volume of blood received in culture bottles   Culture   Final    NO GROWTH 5 DAYS Performed at [facility], [place] [address], [place], [place] [address]    Report Status 08/31/2019 FINAL  Final  SARS Coronavirus 2 by RT PCR (hospital order, performed in [facility] hospital lab) Nasopharyngeal Nasopharyngeal Swab     Status: None   Collection Time: 08/26/19  1:13 PM   Specimen: Nasopharyngeal Swab  Result Value Ref Range Status   SARS Coronavirus 2 NEGATIVE NEGATIVE Final    Comment: (NOTE) SARS-CoV-2 target nucleic acids are NOT DETECTED.  The SARS-CoV-2 RNA is generally detectable in upper and lower respiratory specimens during the acute phase of infection. The lowest concentration of SARS-CoV-2 viral copies this assay can detect is 250 copies / mL. A negative result does not preclude SARS-CoV-2 infection and should not be used as the sole basis for treatment or other patient management decisions.  A negative result may occur with improper specimen collection / handling, submission of specimen other than nasopharyngeal swab, presence of viral mutation(s) within the areas targeted  by this assay, and inadequate number of viral copies (<250 copies / mL). A negative result must be combined with clinical observations, patient history, and epidemiological information.  Fact Sheet for Patients:   StrictlyIdeas.no  Fact Sheet for Healthcare Providers: BankingDealers.co.za  This test is not yet approved or  cleared by the [place] FDA and has been authorized for detection and/or diagnosis of SARS-CoV-2 by FDA under an Emergency Use Authorization (EUA).  This EUA will remain in effect (meaning this test can be used) for the duration of the COVID-19 declaration under Section 564(b)(1) of the Act, 21 U.S.C. section 360bbb-3(b)(1), unless the authorization is terminated or revoked sooner.  Performed at [facility], [place] [address], [place], [place] [address]   Surgical pcr screen     Status: None   Collection Time: 08/27/19 10:01 AM   Specimen: Nasal Mucosa; Nasal Swab  Result Value Ref Range Status   MRSA, PCR NEGATIVE NEGATIVE Final   Staphylococcus aureus NEGATIVE NEGATIVE Final    Comment: (NOTE) The Xpert SA Assay (FDA approved for NASAL specimens in patients 64 years of age and older), is one component of a comprehensive surveillance program. It is not intended to diagnose infection nor to guide or monitor treatment. Performed at [facility], [place] [address], [place], [place] [address]          Radiology Studies: No results found. Scheduled Meds: .  aspirin EC  81 mg Oral Daily  . citalopram  20 mg Oral Daily  . docusate sodium  100 mg Oral BID  . enoxaparin (LOVENOX) injection  60 mg Subcutaneous Q24H  . insulin aspart  0-15 Units Subcutaneous TID WC  . insulin aspart  0-5 Units Subcutaneous QHS  . sodium chloride flush  3 mL Intravenous Once  . sodium chloride flush  3 mL Intravenous Q12H  . traMADol  50 mg Oral Q6H  . verapamil  240 mg Oral Q breakfast   Continuous Infusions: .  sodium chloride 125 mL/hr at 08/26/19 1750  . sodium chloride 125 mL/hr at 08/28/19 1732  .  ceFAZolin (ANCEF) IV    . cefTRIAXone (ROCEPHIN)  IV 2 g (08/31/19 2136)  . lactated ringers 10 mL/hr at 08/27/19 1354  . methocarbamol (ROBAXIN) IV    . metronidazole 500 mg (09/01/19 1301)     LOS: 6 days    Time spent:  25 mins.    Shawna Clamp, MD [facility]   If 7PM-7AM, please contact night-coverage

## 2019-09-02 ENCOUNTER — Encounter (HOSPITAL_COMMUNITY): Payer: Self-pay | Admitting: Orthopedic Surgery

## 2019-09-02 LAB — GLUCOSE, CAPILLARY
Glucose-Capillary: 146 mg/dL — ABNORMAL HIGH (ref 70–99)
Glucose-Capillary: 148 mg/dL — ABNORMAL HIGH (ref 70–99)
Glucose-Capillary: 173 mg/dL — ABNORMAL HIGH (ref 70–99)
Glucose-Capillary: 213 mg/dL — ABNORMAL HIGH (ref 70–99)

## 2019-09-02 NOTE — TOC Initial Note (Signed)
Transition of Care [facility]) - Initial/Assessment Note    Patient Details  Name: [name] MRN: [id number] Date of Birth: 1960/06/30  Transition of Care [facility] CM/SW Contact:    Janae Bridgeman, RN Phone Number: 09/02/2019, 1:53 PM  Clinical Narrative:                 Case management spoke with the patient at the bedside S/P [name] and intact to wound vac.  The patient works at [employer] and lives with his wife at home.  The patient would like to be placed in CIR if his insurance approves and was ambulatory prior to the surgery and does not own any dme.  The patient states that he has not had the COVID vaccine and declined the vaccine when offered by myself.  Will continue to follow for possible CIR placement.  Expected Discharge Plan: IP Rehab Facility Barriers to Discharge: Insurance Authorization   Patient Goals and CMS Choice Patient states their goals for this hospitalization and ongoing recovery are:: Patient plans to go to CIR if insurance approves. CMS Medicare.gov Compare Post Acute Care list provided to:: Patient Choice offered to / list presented to : Patient  Expected Discharge Plan and Services Expected Discharge Plan: IP Rehab Facility   Discharge Planning Services: CM Consult Post Acute Care Choice: IP Rehab Living arrangements for the past 2 months: Single Family Home                                      Prior Living Arrangements/Services Living arrangements for the past 2 months: Single Family Home Lives with:: Spouse Patient language and need for interpreter reviewed:: Yes Do you feel safe going back to the place where you live?: Yes      Need for Family Participation in Patient Care: Yes (Comment) Care giver support system in place?: Yes (comment)   Criminal Activity/Legal Involvement Pertinent to Current Situation/Hospitalization: No - Comment as needed  Activities of Daily Living Home Assistive  Devices/Equipment: CBG Meter, Eyeglasses ADL Screening (condition at time of admission) Patient's cognitive ability adequate to safely complete daily activities?: Yes Is the patient deaf or have difficulty hearing?: No Does the patient have difficulty seeing, even when wearing glasses/contacts?: No Does the patient have difficulty concentrating, remembering, or making decisions?: No Patient able to express need for assistance with ADLs?: Yes Does the patient have difficulty dressing or bathing?: No Independently performs ADLs?: Yes (appropriate for developmental age) Does the patient have difficulty walking or climbing stairs?: Yes Weakness of Legs: Right Weakness of Arms/Hands: None  Permission Sought/Granted Permission sought to share information with : Case Manager Permission granted to share information with : Yes, Verbal Permission Granted        Permission granted to share info w Relationship: spouse - [name]     Emotional Assessment Appearance:: Appears stated age Attitude/Demeanor/Rapport: Gracious Affect (typically observed): Accepting Orientation: : Oriented to Self, Oriented to Place, Oriented to  Time, Oriented to Situation Alcohol / Substance Use: Not Applicable Psych Involvement: No (comment)  Admission diagnosis:  Osteomyelitis of ankle or foot, acute ([name]) [M86.179] Diabetic ulcer of right heel associated with type 2 diabetes mellitus, with necrosis of bone ([name]) [V37.106, L97.414] Osteomyelitis of right foot, unspecified type ([name]) [M86.9] Sepsis, due to unspecified organism, unspecified whether acute organ dysfunction present [facility]) [A41.9] Patient Active Problem List   Diagnosis Date  Noted  . 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[name])   . Presence of retained hardware   . Osteomyelitis of ankle or foot, acute ([name]) 08/26/2019  . Severe sepsis ([name]) 08/26/2019  . Hyponatremia 08/26/2019  . Acute osteomyelitis of right calcaneus ([name])   . Cutaneous abscess of right  foot   . Diabetic ulcer of right heel associated with type 2 diabetes mellitus ([name]) 07/16/2019  . Poorly controlled diabetes mellitus ([name]) 07/16/2019  . FHx: heart disease 10/08/2017  . Type 2 diabetes mellitus with stage 2 chronic kidney disease, without long-term current use of insulin ([name]) 10/08/2017  . Insomnia 07/08/2017  . Smoker 08/17/2015  . COPD (chronic obstructive pulmonary disease) ([name]) 08/17/2015  . 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 Medication management 05/19/2013  . Morbid obesity (BMI 35) 05/19/2013  . Essential hypertension   . Hyperlipidemia associated with type 2 diabetes mellitus ([name])   . Vitamin D deficiency   . History of kidney stones   . Testosterone deficiency   . History of hepatitis C    PCP:  Lucky Cowboy, MD Pharmacy:   [facility] [address] [place]), [place] - [address] [place] [address] Phone: [phone] Fax: [phone]     Social Determinants of Health (SDOH) Interventions    Readmission Risk Interventions Readmission Risk Prevention Plan 09/02/2019  Post Dischage Appt Complete  Medication Screening Complete  Transportation Screening Complete  Some recent data might be hidden

## 2019-09-02 NOTE — Evaluation (Signed)
Physical Therapy Evaluation Patient Details Name: [name] MRN: [id number] DOB: 08/10/1960 Today's Date: 09/02/2019   History of Present Illness  Pt is a 59 y.o. male s/p [name]. PMH includes but not limited to:  hypertension, type 2 diabetes mellitus multiple other comorbidities including diagnosis of osteomyelitis of right calcaneus and abscess of the right calcaneus is status post incision and drainage done on 08/20/2019.  Pt reports L shoulder injury last October and is still weak.  Clinical Impression  Pt is now s/p BKA on POD #1. Pt is very pleasant and motivated with excellent pain control.  Pt does have prior injury to R shoulder leading to decreased upper body strength and increased difficulty with transfers. Pt required mod A to stand from normal height surface and min A for steadying with pivots.  Presents with decreased mobility, strength, endurance, safety, balance, and ROM.  Pt was educated on HEP and positioning s/p BKA and gave BKA HEP handout.  Pt currently with functional limitations due to the deficits listed below (see PT Problem List). Pt will benefit from skilled PT to increase their independence and safety with mobility to allow discharge to the venue listed below.  Goals were adjusted to reflect changes since BKA.       Follow Up Recommendations CIR    Equipment Recommendations  Rolling walker with 5" wheels;Wheelchair cushion (measurements PT);Wheelchair (measurements PT);3in1 (PT)    Recommendations for Other Services Rehab consult     Precautions / Restrictions Precautions Precautions: Fall Restrictions Weight Bearing Restrictions: Yes RLE Weight Bearing: Non weight bearing      Mobility  Bed Mobility Overal bed mobility: Needs Assistance Bed Mobility: Supine to Sit     Supine to sit: Supervision;HOB elevated Sit to supine: Min guard;HOB elevated   General bed mobility comments: supervision for safety  Transfers Overall transfer level: Needs  assistance Equipment used: Rolling walker (2 wheeled) Transfers: Sit to/from [employer] Sit to Stand: Mod assist Stand pivot transfers: Min assist       General transfer comment: Standing from chair (non elevated) required mod A and 2 attempts.  Pt recalled safe hand placement cues from OT.  Min A for steadying for pivot once upright.  Cued on pivots to R side for increase ease at this time.  Ambulation/Gait             General Gait Details: deferred  Stairs            Wheelchair Mobility    Modified Rankin (Stroke Patients Only)       Balance Overall balance assessment: Needs assistance Sitting-balance support: No upper extremity supported Sitting balance-Leahy Scale: Good     Standing balance support: Bilateral upper extremity supported Standing balance-Leahy Scale: Poor Standing balance comment: Reliance on RW and min A during pivot                             Pertinent Vitals/Pain Pain Assessment: No/denies pain Pain Score: 4  Pain Location: RLE Pain Descriptors / Indicators: Aching;Discomfort Pain Intervention(s): Monitored during session;Limited activity within patient's tolerance    Home Living Family/patient expects to be discharged to:: Private residence Living Arrangements: Spouse/significant other Available Help at Discharge: Family Type of Home: House Home Access: Stairs to enter Entrance Stairs-Rails: None Entrance Stairs-Number of Steps: 1 Home Layout: One level Home Equipment: Grab bars - tub/shower;Hand held shower head Additional Comments: grab bar in shower and hand held shower  Prior Function Level of Independence: Needs assistance   Gait / Transfers Assistance Needed: Walking with a cane; limited household distances over past 2-3 months b/c of R foot pain; prior to that he was completely independent with gait (community ambulation), ADLs, and IADLs  ADL's / Homemaking Assistance Needed:  independent        Hand Dominance   Dominant Hand: Left    Extremity/Trunk Assessment   Upper Extremity Assessment Upper Extremity Assessment: Defer to OT evaluation LUE Deficits / Details: Pt does demonstrate decreased strength in UE (R worse than L) - unable to complete tricep push down in chair LUE Sensation: WNL LUE Coordination: WNL    Lower Extremity Assessment Lower Extremity Assessment: LLE deficits/detail;RLE deficits/detail RLE Deficits / Details: R BKA.  R knee ROM 0 to 75 degrees (feels tape from wound vac is limiting flex) and MMT at least 3/5.  R hip ROM WFL and MMT at least 3/5 RLE: Unable to fully assess due to pain;Unable to fully assess due to immobilization LLE Deficits / Details: ROM WFL; MMT 5/5    Cervical / Trunk Assessment Cervical / Trunk Assessment: Normal  Communication   Communication: No difficulties  Cognition Arousal/Alertness: Awake/alert Behavior During Therapy: WFL for tasks assessed/performed Overall Cognitive Status: Within Functional Limits for tasks assessed                                        General Comments General comments (skin integrity, edema, etc.): Educated on BKA positioning and HEP.  Gave handout for HEP.    Exercises Amputee Exercises Quad Sets: Both;10 reps;Seated Gluteal Sets: 10 reps;Seated Hip ABduction/ADduction: AROM;Right;10 reps;Supine Knee Flexion: AROM;Right;10 reps;Seated Knee Extension: Right;10 reps;Seated;AROM Straight Leg Raises: AROM;Both;10 reps;Supine   Assessment/Plan    PT Assessment Patient needs continued PT services  PT Problem List Decreased strength;Decreased range of motion;Decreased activity tolerance;Decreased balance;Decreased mobility;Decreased safety awareness;Decreased knowledge of use of DME;Cardiopulmonary status limiting activity;Pain       PT Treatment Interventions DME instruction;Gait training;Functional mobility training;Therapeutic activities;Therapeutic  exercise;Balance training;Neuromuscular re-education;Patient/family education;Stair training    PT Goals (Current goals can be found in the Care Plan section)  Acute Rehab PT Goals Patient Stated Goal: get stronger, do rehab, go home PT Goal Formulation: With patient Time For Goal Achievement: 09/16/19 Potential to Achieve Goals: Good    Frequency Min 5X/week   Barriers to discharge        Co-evaluation               AM-PAC PT "6 Clicks" Mobility  Outcome Measure Help needed turning from your back to your side while in a flat bed without using bedrails?: A Little Help needed moving from lying on your back to sitting on the side of a flat bed without using bedrails?: A Little Help needed moving to and from a bed to a chair (including a wheelchair)?: A Lot Help needed standing up from a chair using your arms (e.g., wheelchair or bedside chair)?: A Lot Help needed to walk in hospital room?: Total Help needed climbing 3-5 steps with a railing? : Total 6 Click Score: 12    End of Session Equipment Utilized During Treatment: Gait belt Activity Tolerance: Patient tolerated treatment well Patient left: in bed;with family/visitor present;with call bell/phone within reach Nurse Communication: Mobility status PT Visit Diagnosis: Other abnormalities of gait and mobility (R26.89);Muscle weakness (generalized) (M62.81)    Time: 2035-5974  PT Time Calculation (min) (ACUTE ONLY): 25 min   Charges:   PT Evaluation $PT Re-evaluation: 1 Re-eval PT Treatments $Therapeutic Exercise: 8-22 mins        Anise Salvo, PT Acute Rehab Services Pager [phone] [facility] Rehab [phone]    Rayetta Humphrey 09/02/2019, 11:31 AM

## 2019-09-02 NOTE — Anesthesia Postprocedure Evaluation (Signed)
Anesthesia Post Note  Patient: [name]  Procedure(s) Performed: RIGHT BELOW KNEE AMPUTATION (Right Knee)     Patient location during evaluation: PACU Anesthesia Type: General Level of consciousness: awake and alert Pain management: pain level controlled Vital Signs Assessment: post-procedure vital signs reviewed and stable Respiratory status: spontaneous breathing, nonlabored ventilation, respiratory function stable and patient connected to nasal cannula oxygen Cardiovascular status: blood pressure returned to baseline and stable Postop Assessment: no apparent nausea or vomiting Anesthetic complications: no   No complications documented.               Shelton Silvas

## 2019-09-02 NOTE — Plan of Care (Signed)

## 2019-09-02 NOTE — Progress Notes (Signed)
PROGRESS NOTE    [name]  XAJ:[id number] DOB: 09/25/1960 DOA: 08/26/2019 PCP: [name], MD   Brief Narrative:  [name] a 59 y.o.malewith medical history significant ofhypertension, type 2 diabetes mellitus,  multiple other comorbidities including diagnosis of osteomyelitis of right calcaneus and abscess of the right calcaneus is status post incision and drainage done on 08/20/2019 by [name] on outpatient basis and discharged home the same day presented back to ED with the complaint of worsening pain and swelling of the right heel. According to patient, he was discharged home with no antibiotics however wound VAC was attached. Wound VAC was dislodged on Sunday night by accident. He followed up with Dr. [name] office on Monday and was seen by PA and was told that his wound looked better. Subsequently, yesterday he received a call from Dr. [name] PA that his culture from the abscess is growing bacteria so he was prescribed Omnicef which he took last night and this morning however he started having worsening redness, pain and swelling of the right foot along with chills fever and sweating yesterday which persisted today so he came to the emergency department. T-max was 102. Other than that he does not have any other complaint.In CN:OBSJGGE had some tachypnea as well as tachycardia. Has significant leukocytosis with white cells of 26.6as compared to normal last week. He was diagnosed with severe sepsis based on tachycardia, tachypnea, leukocytosis and lactic acid of 2.2. He received some antibiotics in the ED. Orthopedic on call/Dr. [name] consulted and admission was deferred to hospital service.Covid swab negative - patient is unvaccinated. Patient is admitted for severe sepsis secondary to right calcaneal cellulitis versus osteomyelitis.  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  PT and OT recommended inpatient rehab.  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  -MRI showed soft tissue ulcer without evidence of osteomyelitis or abscess -Orthopedic surgery following in consult. -POD 2 SYME amp - c/w abx and wound vac,   -Underwent rt. BKA by Dr [name] on wed, 8/11. - Pt lives with wife-has good social support -Continue IV fluids,changed abx from zosyn vanc to ctx and flagyl. - Continue to follow cultures, initial culture from abscess growing gram-positive cocci and gram-negative rods;blood culturesneg.  Essential hypertension:controlled. -Home medications includelisinopril, hydrochlorothiazide and verapamil. -Continue verapamil only, will restart other medications as BP allows.  Hyponatremia, chronic:Resolved  -Baseline around 133, currently within normal limits. -Continue to hold HCTZ.  Type 2 diabetes mellitus with polyneuropathy, borderline control: Hold home medications, continue sliding scale insulin, hypoglycemic protocol. Discussed need for medication and dietary compliance at length at bedside  DVT prophylaxis: Lovenox Code Status: Full Family Communication: Wife was at bedside, discussed with patient in detail Disposition Plan:  Dispo: The patient is from: Home  Anticipated d/c is to: CIR  Anticipated d/c date is: > 1-2 days  Patient currently is not medically stable to d/c awaiting insurance  authorization    Consultants:   Orthopeadics.  Procedures:  Antimicrobials:  Anti-infectives (  From admission, onward)   Start     Dose/Rate Route Frequency Ordered Stop   09/01/19 0600  ceFAZolin (ANCEF) 3 g in dextrose 5 % 50 mL IVPB        3 g 100 mL/hr over 30 Minutes Intravenous On call to O.R. 08/31/19 0941 09/01/19 1722   08/28/19 2200  cefTRIAXone (ROCEPHIN) 2 g in sodium chloride 0.9 % 100 mL IVPB     Discontinue     2 g 200 mL/hr over 30 Minutes Intravenous Every 24 hours 08/28/19 1340     08/28/19 2200  metroNIDAZOLE (FLAGYL) IVPB 500 mg     Discontinue     500 mg 100 mL/hr over 60 Minutes Intravenous Every 8 hours 08/28/19 1340     08/27/19 0200  vancomycin (VANCOREADY) IVPB 750 mg/150 mL  Status:  Discontinued        750 mg 150 mL/hr over 60 Minutes Intravenous Every 12 hours 08/26/19 1231 08/28/19 1340   08/26/19 2200  piperacillin-tazobactam (ZOSYN) IVPB 3.375 g  Status:  Discontinued        3.375 g 12.5 mL/hr over 240 Minutes Intravenous Every 8 hours 08/26/19 1412 08/28/19 1340   08/26/19 1345  vancomycin (VANCOCIN) IVPB 1000 mg/200 mL premix  Status:  Discontinued        1,000 mg 200 mL/hr over 60 Minutes Intravenous  Once 08/26/19 1331 08/26/19 1352   08/26/19 1345  piperacillin-tazobactam (ZOSYN) IVPB 3.375 g        3.375 g 100 mL/hr over 30 Minutes Intravenous  Once 08/26/19 1331 08/26/19 1701   08/26/19 1345  vancomycin (VANCOCIN) IVPB 1000 mg/200 mL premix  Status:  Discontinued        1,000 mg 200 mL/hr over 60 Minutes Intravenous  Once 08/26/19 1331 08/26/19 1352   08/26/19 1145  vancomycin (VANCOCIN) IVPB 1000 mg/200 mL premix  Status:  Discontinued        1,000 mg 200 mL/hr over 60 Minutes Intravenous  Once 08/26/19 1138 08/26/19 1139   08/26/19 1145  cefTRIAXone (ROCEPHIN) 2 g in sodium chloride 0.9 % 100 mL IVPB        2 g 200 mL/hr over 30 Minutes Intravenous  Once 08/26/19 1138 08/26/19 1342   08/26/19 1145  vancomycin (VANCOREADY) IVPB 2000  mg/400 mL        2,000 mg 200 mL/hr over 120 Minutes Intravenous  Once 08/26/19 1139 08/26/19 1629     Subjective: Patient was seen and examined at bedside.  Patient is status post [name].  Sitting comfortably in the chair,  reports pain is better controlled with pain medications,  anticipating discharge to rehab.   Objective: Vitals:   09/01/19 2011 09/02/19 0020 09/02/19 0454 09/02/19 0809  BP: 121/68 131/70 124/65 127/67  Pulse: 69 64 62 63  Resp: _0 Temp: 97.6 F (36.4 C) 97.7 F (36.5 C) (!) 97.4 F (36.3 C) (!) 97.4 F (36.3 C)  TempSrc: Oral Oral Oral Oral  SpO2: 97% 96% 94% 95%  Weight:      Height:        Intake/Output Summary (Last 24 hours) at 09/02/2019 1531 Last data filed at 09/02/2019 0500 Gross per 24 hour  Intake --  Output 450 ml  Net -450 ml   Filed Weights   08/26/19 0942 08/26/19 1527 08/27/19 1350  Weight: 117.9 kg 119.9 kg 119.9 kg    Examination:  General exam: Appears calm and comfortable  Respiratory system: Clear to auscultation. Respiratory  effort normal. Cardiovascular system: S1 & S2 heard, RRR. No JVD, murmurs, rubs, gallops or clicks. No pedal edema. Gastrointestinal system: Abdomen is nondistended, soft and nontender. No organomegaly or masses felt. Normal bowel sounds heard. Central nervous system: Alert and oriented. No focal neurological deficits. Extremities: Right Above knee amputation with a wound VAC. Skin: No rashes, lesions or ulcers Psychiatry: Judgement and insight appear normal. Mood & affect appropriate.     Data Reviewed: I have personally reviewed following labs and imaging studies  CBC: Recent Labs  Lab 08/28/19 0935 08/29/19 1235 08/30/19 0238 08/31/19 0325 09/01/19 1046  WBC 16.7* 13.3* 12.2* 10.8* 6.7  NEUTROABS 13.6* 9.9* 8.5*  --   --   HGB 11.9* 11.6* 11.0* 11.3* 11.8*  HCT 36.0* 35.7* 33.7* 35.0* 36.2*  MCV 94.7 95.2 94.4 95.1 94.8  PLT 185 235 289 285 419   Basic Metabolic  Panel: Recent Labs  Lab 08/26/19 1537 08/27/19 0455 08/28/19 0935 08/30/19 0238 08/31/19 0325 09/01/19 1046  NA  --  130* 132* 133* 131* 135  K  --  3.8 4.6 4.1 4.0 4.2  CL  --  99 103 103 100 101  CO2  --  19* 20* 20* 20* 26  GLUCOSE  --  95 97 109* 129* 110*  BUN  --  18 23* _0 CREATININE  --  0.89 0.83 0.68 0.57* 0.58*  CALCIUM  --  8.4* 8.1* 8.1* 8.0* 8.2*  MG 1.8  --   --   --   --   --    GFR: Estimated Creatinine Clearance: 137.1 mL/min (A) (by C-G formula based on SCr of 0.58 mg/dL (L)). Liver Function Tests: No results for input(s): AST, ALT, ALKPHOS, BILITOT, PROT, ALBUMIN in the last 168 hours. No results for input(s): LIPASE, AMYLASE in the last 168 hours. No results for input(s): AMMONIA in the last 168 hours. Coagulation Profile: Recent Labs  Lab 08/27/19 0455  INR 1.4*   Cardiac Enzymes: No results for input(s): CKTOTAL, CKMB, CKMBINDEX, TROPONINI in the last 168 hours. BNP (last 3 results) No results for input(s): PROBNP in the last 8760 hours. HbA1C: No results for input(s): HGBA1C in the last 72 hours. CBG: Recent Labs  Lab 09/01/19 1329 09/01/19 1625 09/01/19 1747 09/02/19 0639 09/02/19 1132  GLUCAP 102* 99 112* 146* 213*   Lipid Profile: No results for input(s): CHOL, HDL, LDLCALC, TRIG, CHOLHDL, LDLDIRECT in the last 72 hours. Thyroid Function Tests: No results for input(s): TSH, T4TOTAL, FREET4, T3FREE, THYROIDAB in the last 72 hours. Anemia Panel: No results for input(s): VITAMINB12, FOLATE, FERRITIN, TIBC, IRON, RETICCTPCT in the last 72 hours. Sepsis Labs: Recent Labs  Lab 08/26/19 1627 08/26/19 1925 08/27/19 0455  PROCALCITON  --   --  2.67  LATICACIDVEN 3.3* 1.7  --     Recent Results (from the past 240 hour(s))  Blood Culture (routine x 2)     Status: None   Collection Time: 08/26/19 12:37 PM   Specimen: BLOOD RIGHT ARM  Result Value Ref Range Status   Specimen Description BLOOD RIGHT ARM  Final   Special Requests    Final    BOTTLES DRAWN AEROBIC ONLY Blood Culture results may not be optimal due to an inadequate volume of blood received in culture bottles   Culture   Final    NO GROWTH 5 DAYS Performed at [facility], [place] [address], [place], [place] [address]    Report Status 08/31/2019 FINAL  Final  Blood Culture (routine x 2)     Status: None   Collection Time: 08/26/19  1:00 PM   Specimen: BLOOD RIGHT FOREARM  Result Value Ref Range Status   Specimen Description BLOOD RIGHT FOREARM  Final   Special Requests   Final    BOTTLES DRAWN AEROBIC ONLY Blood Culture results may not be optimal due to an inadequate volume of blood received in culture bottles   Culture   Final    NO GROWTH 5 DAYS Performed at [facility], [place] [address] [place], [place] [address]    Report Status 08/31/2019 FINAL  Final  SARS Coronavirus 2 by RT PCR (hospital order, performed in [facility] hospital lab) Nasopharyngeal Nasopharyngeal Swab     Status: None   Collection Time: 08/26/19  1:13 PM   Specimen: Nasopharyngeal Swab  Result Value Ref Range Status   SARS Coronavirus 2 NEGATIVE NEGATIVE Final    Comment: (NOTE) SARS-CoV-2 target nucleic acids are NOT DETECTED.  The SARS-CoV-2 RNA is generally detectable in upper and lower respiratory specimens during the acute phase of infection. The lowest concentration of SARS-CoV-2 viral copies this assay can detect is 250 copies / mL. A negative result does not preclude SARS-CoV-2 infection and should not be used as the sole basis for treatment or other patient management decisions.  A negative result may occur with improper specimen collection / handling, submission of specimen other than nasopharyngeal swab, presence of viral mutation(s) within the areas targeted by this assay, and inadequate number of viral copies (<250 copies / mL). A negative result must be combined with clinical observations, patient history, and epidemiological information.  Fact  Sheet for Patients:   StrictlyIdeas.no  Fact Sheet for Healthcare Providers: BankingDealers.co.za  This test is not yet approved or  cleared by the [place] FDA and has been authorized for detection and/or diagnosis of SARS-CoV-2 by FDA under an Emergency Use Authorization (EUA).  This EUA will remain in effect (meaning this test can be used) for the duration of the COVID-19 declaration under Section 564(b)(1) of the Act, 21 U.S.C. section 360bbb-3(b)(1), unless the authorization is terminated or revoked sooner.  Performed at [facility], [place] [address] [place], [place] [address]   Surgical pcr screen     Status: None   Collection Time: 08/27/19 10:01 AM   Specimen: Nasal Mucosa; Nasal Swab  Result Value Ref Range Status   MRSA, PCR NEGATIVE NEGATIVE Final   Staphylococcus aureus NEGATIVE NEGATIVE Final    Comment: (NOTE) The Xpert SA Assay (FDA approved for NASAL specimens in patients 88 years of age and older), is one component of a comprehensive surveillance program. It is not intended to diagnose infection nor to guide or monitor treatment. Performed at [facility], [place] [address], [place], [place] [address]   Aerobic/Anaerobic Culture (surgical/deep wound)     Status: None (Preliminary result)   Collection Time: 09/01/19  5:18 PM   Specimen: PATH Other; Tissue  Result Value Ref Range Status   Specimen Description TISSUE RIGHT LOWER LEG  Final   Special Requests NONE  Final   Gram Stain   Final    MODERATE WBC PRESENT, PREDOMINANTLY PMN NO ORGANISMS SEEN    Culture   Final    NO GROWTH < 24 HOURS Performed at [facility], [place] [address], [place], [place] [address]    Report Status PENDING  Incomplete         Radiology Studies:  DG MINI C-ARM IMAGE ONLY  Result Date: 09/01/2019 There is no interpretation for this exam.  This order is for images obtained during a surgical procedure.   Please See "Surgeries" Tab for more information regarding the procedure.   Scheduled Meds: . aspirin EC  81 mg Oral Daily  . citalopram  20 mg Oral Daily  . docusate sodium  100 mg Oral BID  . enoxaparin (LOVENOX) injection  60 mg Subcutaneous Q24H  . insulin aspart  0-15 Units Subcutaneous TID WC  . insulin aspart  0-5 Units Subcutaneous QHS  . sodium chloride flush  3 mL Intravenous Once  . sodium chloride flush  3 mL Intravenous Q12H  . traMADol  50 mg Oral Q6H  . verapamil  240 mg Oral Q breakfast   Continuous Infusions: . sodium chloride 125 mL/hr at 08/26/19 1750  . sodium chloride 125 mL/hr at 08/28/19 1732  . sodium chloride    . cefTRIAXone (ROCEPHIN)  IV 2 g (09/01/19 2109)  . lactated ringers 500 mL/hr at 09/01/19 1733  . methocarbamol (ROBAXIN) IV    . metronidazole 500 mg (09/02/19 1330)     LOS: 7 days    Time spent:  25 mins.    Shawna Clamp, MD [facility]   If 7PM-7AM, please contact night-coverage

## 2019-09-02 NOTE — PMR Pre-admission (Signed)
PMR Admission Coordinator Pre-Admission Assessment  Patient: [name] is an 59 y.o., male MRN: [id number] DOB: [birth year] Height: 6' 3" (190.5 cm) Weight: 119.9 kg  Insurance Information HMO:     PPO: yes     PCP:      IPA:      80/20:      OTHER:  PRIMARY: [employer]      Policy#: [id number]      Subscriber: pt CM Name: DTilda Burrow     Phone#: [phone]    Fax#: [phone]-Cert#: [id number] Cert for 5 days     Employer: [employer] Benefits:  Phone #: [phone]    Name: 8/12 Eff. Date: 06/22/2019     Deduct: $3000      Out of Pocket Max: [phone]     Life Max: none CIR: 70%      SNF: 70% 60 days Outpatient: 70%     Co-Pay: 30 visits each PT, OT, and SLP Home Health: 70%      Co-Pay: visits limited per medical neccesity DME: 50%     Co-Pay: 0% Providers: in network  SECONDARY: none      Policy#:      Phone#:   FDevelopment worker, community       Phone#:   The "Engineer, petroleum for patients in Inpatient Rehabilitation Facilities with attached "Privacy [place]" was provided and verbally reviewed with: N/A  Emergency Contact Information Contact Information    Name Relation Home Work MFremontSpouse [phone]     Current Medical History  Patient Admitting Diagnosis: BKA  History of Present Illness:  [name] a 59year old right-handed male with history of diabetes mellitus, hyperlipidemia, morbid obesity with BMI 33.04, hypertension, hepatitis C and tobacco abuse.  .  Patient reportedly rather sedentary due to right heel ulcer.  Presented 08/26/2019 with gangrenous changes right calcaneus and recently underwent incision and drainage 08/20/2019 by Dr. DSharol Givenon an outpatient basis.  Presented with increasing pain and swelling of right heel as well as low-grade fever.  MRI of the right ankle and foot showed soft tissue ulcer posterior medial aspect of the hindfoot at the level of the  posterior calcaneus no evidence of osteomyelitis or abscess.  Admission chemistry sodium 131, glucose 129, creatinine 0.57, WBC 10,800, hemoglobin 11.3.  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 DSharol Given  Wound VAC applied as directed.  Initially maintained on Zosyn changed to Rocephin and Flagyl initial cultures growing gram-positive cocci gram-negative rods blood cultures negative.  Postoperative hemoglobin 11.6.  Placed on Lovenox for DVT prophylaxis.     [facility] by the rehabilitation admission coordinator and physician.  Past Medical History  Past Medical History:  Diagnosis Date  . Diabetic neuropathy ([place]   . Diverticulitis   . History of hepatitis C    has been treated in the past  . History of kidney stones   . Hyperlipidemia   . Hypertension   . Other testicular hypofunction   . Type II or unspecified type diabetes mellitus without mention of complication, not stated as uncontrolled   . Vitamin D deficiency     Family History   family history includes Alzheimer's disease in his father; Cancer in his father; Hypertension in his mother; Stroke in his father.  Prior Rehab/Hospitalizations Has the patient had prior rehab or  hospitalizations prior to admission? Yes  Has the patient had major surgery during 100 days prior to admission? Yes   Current Medications  Current Facility-Administered Medications:  .  0.9 %  sodium chloride infusion, , Intravenous, Continuous, Persons, [name], [place], Last Rate: 125 mL/hr at 08/26/19 1750, New Bag at 08/26/19 1750 .  0.9 %  sodium chloride infusion, , Intravenous, Continuous, Persons, [name], [place], Last Rate: 125 mL/hr at 08/28/19 1732, New Bag at 08/28/19 1732 .  0.9 %  sodium chloride infusion, , Intravenous, Continuous, Persons, [name], [place] .  acetaminophen (TYLENOL) tablet 325-650 mg, 325-650 mg, Oral, Q6H PRN, Persons, [name], [place], 650 mg at 09/02/19 2123 .  aspirin EC tablet 81 mg, 81 mg, Oral, Daily, Persons, [name], [place], 81 mg at 09/03/19 1027 .  cefTRIAXone (ROCEPHIN) 2 g in sodium chloride 0.9 % 100 mL IVPB, 2 g, Intravenous, Q24H, Persons, [name], [place], Last Rate: 200 mL/hr at 09/02/19 2127, 2 g at 09/02/19 2127 .  citalopram (CELEXA) tablet 20 mg, 20 mg, Oral, Daily, Persons, [name], [place], 20 mg at 09/03/19 1028 .  diphenhydrAMINE (BENADRYL) 12.5 MG/5ML elixir 25 mg, 25 mg, Oral, Q4H PRN, Persons, [name], [place] .  docusate sodium (COLACE) capsule 100 mg, 100 mg, Oral, BID, Persons, [name], [place], 100 mg at 09/02/19 2123 .  enoxaparin (LOVENOX) injection 60 mg, 60 mg, Subcutaneous, Q24H, Persons, [name], PA, 60 mg at 09/02/19 1707 .  HYDROmorphone (DILAUDID) injection 0.5-1 mg, 0.5-1 mg, Intravenous, Q4H PRN, Persons, [name], PA, 1 mg at 09/03/19 0157 .  insulin aspart (novoLOG) injection 0-15 Units, 0-15 Units, Subcutaneous, TID WC, Persons, [name], [place], 3 Units at 09/03/19 1225 .  insulin aspart (novoLOG) injection 0-5 Units, 0-5 Units, Subcutaneous, QHS, Persons, [name], PA .  lactated ringers infusion, , Intravenous, Continuous, Persons, [name], [place], Last Rate: 500 mL/hr at 09/01/19 1733, New Bag at 09/01/19 1733 .  magnesium citrate solution 1 Bottle, 1 Bottle, Oral, Once PRN, Persons, [name], PA .  methocarbamol (ROBAXIN) tablet 500 mg, 500 mg, Oral, Q6H PRN, 500 mg at 09/03/19 0624 **OR** methocarbamol (ROBAXIN) 500 mg in dextrose 5 % 50 mL IVPB, 500 mg, Intravenous, Q6H PRN, Persons, [name], PA .  metoCLOPramide (REGLAN) tablet 5-10 mg, 5-10 mg, Oral, Q8H PRN **OR** metoCLOPramide (REGLAN) injection 5-10 mg, 5-10 mg, Intravenous, Q8H PRN, Persons, [name], PA .  metroNIDAZOLE (FLAGYL) IVPB 500 mg, 500 mg, Intravenous, Q8H, Persons, [name], PA, Last Rate: 100 mL/hr at 09/03/19 1329, 500 mg at 09/03/19 1329 .  ondansetron (ZOFRAN) tablet 4 mg, 4 mg, Oral, Q6H PRN **OR**  ondansetron (ZOFRAN) injection 4 mg, 4 mg, Intravenous, Q6H PRN, Persons, [name], PA .  oxyCODONE (Oxy IR/ROXICODONE) immediate release tablet 10-15 mg, 10-15 mg, Oral, Q4H PRN, Persons, [name], PA .  oxyCODONE (Oxy IR/ROXICODONE) immediate release tablet 5-10 mg, 5-10 mg, Oral, Q4H PRN, Persons, [name], PA, 10 mg at 09/03/19 1028 .  polyethylene glycol (MIRALAX / GLYCOLAX) packet 17 g, 17 g, Oral, Daily PRN, Persons, [name], PA .  sodium chloride flush (NS) 0.9 % injection 3 mL, 3 mL, Intravenous, Once, Persons, [name], PA .  sodium chloride flush (NS) 0.9 % injection 3 mL, 3 mL, Intravenous, Q12H, Persons, [name], PA, 3 mL at 09/03/19 1027 .  sorbitol 70 % solution 30 mL, 30 mL, Oral, Daily PRN, Persons, [name], PA .  traMADol (ULTRAM) tablet 50 mg,  50 mg, Oral, Q6H, Persons, [name], [place], 50 mg at 09/03/19 1247 .  verapamil (CALAN-SR) CR tablet 240 mg, 240 mg, Oral, Q breakfast, Persons, [name], [place], 240 mg at 09/03/19 0900  Patients Current Diet:  Diet Order            Diet Carb Modified Fluid consistency: Thin; Room service appropriate? Yes  Diet effective now                 Precautions / Restrictions Precautions Precautions: Fall Restrictions Weight Bearing Restrictions: Yes RLE Weight Bearing: Non weight bearing   Has the patient had 2 or more falls or a fall with injury in the past year? No  Prior Activity Level Limited Community (1-2x/wk): Mod I with cane over past couple months; driving; working in [employer]  Prior Functional Level Self Care: Did the patient need help bathing, dressing, using the toilet or eating? Independent  Indoor Mobility: Did the patient need assistance with walking from room to room (with or without device)? Independent  Stairs: Did the patient need assistance with internal or external stairs (with or without device)? Independent  Functional Cognition: Did the patient need help planning regular tasks such as shopping or  remembering to take medications? Independent  Home Assistive Devices / Equipment Home Assistive Devices/Equipment: CBG Meter, Eyeglasses Home Equipment: Grab bars - tub/shower, Hand held shower head  Prior Device Use: Indicate devices/aids used by the patient prior to current illness, exacerbation or injury? cane  Current Functional Level Cognition  Overall Cognitive Status: Within Functional Limits for tasks assessed Orientation Level: Oriented X4    Extremity Assessment (includes Sensation/Coordination)  Upper Extremity Assessment: Defer to OT evaluation LUE Deficits / Details: Pt does demonstrate decreased strength in UE (R worse than L) - unable to complete tricep push down in chair LUE Sensation: WNL LUE Coordination: WNL  Lower Extremity Assessment: LLE deficits/detail, RLE deficits/detail RLE Deficits / Details: R BKA.  R knee ROM 0 to 75 degrees (feels tape from wound vac is limiting flex) and MMT at least 3/5.  R hip ROM WFL and MMT at least 3/5 RLE: Unable to fully assess due to pain, Unable to fully assess due to immobilization LLE Deficits / Details: ROM WFL; MMT 5/5    ADLs  Overall ADL's : Needs assistance/impaired Eating/Feeding: Modified independent, Sitting Grooming: Set up, Sitting Upper Body Bathing: Min guard, Sitting (sitting at EOB ) Lower Body Bathing: Minimal assistance, Sitting/lateral leans Upper Body Dressing : Min guard, Sitting Lower Body Dressing: Minimal assistance, Sitting/lateral leans Lower Body Dressing Details (indicate cue type and reason): assist to don sock sitting EOB Toilet Transfer: Stand-pivot, RW, Minimal assistance Toilet Transfer Details (indicate cue type and reason): simulated from elevated EOB to recliner Toileting- Clothing Manipulation and Hygiene: Minimal assistance, Sit to/from stand Toileting - Clothing Manipulation Details (indicate cue type and reason): from EOB elevated Functional mobility during ADLs: Rolling walker,  Minimal assistance General ADL Comments: sat EOB with good control;stand-pivot transfer to recliner pt pivoting foot on floor;educated pt on activity progression, fall prevention,     Mobility  Overal bed mobility: Needs Assistance Bed Mobility: Supine to Sit Supine to sit: Supervision, HOB elevated Sit to supine: Min guard, HOB elevated General bed mobility comments: supervision for safety    Transfers  Overall transfer level: Needs assistance Equipment used: Rolling walker (2 wheeled) Transfers: Sit to/from Stand, [employer] Transfers Sit to Stand: Mod assist Stand pivot transfers: Min assist General transfer comment: Standing from chair (  non elevated) required mod A and 2 attempts.  Pt recalled safe hand placement cues from OT.  Min A for steadying for pivot once upright.  Cued on pivots to R side for increase ease at this time.    Ambulation / Gait / Stairs / Wheelchair Mobility  Ambulation/Gait Ambulation/Gait assistance: Mod assist Gait Distance (Feet): 5 Feet Assistive device: Rolling walker (2 wheeled) Gait Pattern/deviations: Step-to pattern, Trunk flexed General Gait Details: deferred Gait velocity: decreased     Posture / Balance Balance Overall balance assessment: Needs assistance Sitting-balance support: No upper extremity supported Sitting balance-Leahy Scale: Good Standing balance support: Bilateral upper extremity supported Standing balance-Leahy Scale: Poor Standing balance comment: Reliance on RW and min A during pivot    Special needs/care consideration Wound VAC Hgb A1c 6.6 visitor is wife, [name]   Previous Home Environment  Living Arrangements: Spouse/significant other  Lives With: Spouse Available Help at Discharge: Family, Available 24 hours/day Type of Home: House Home Layout: One level Home Access: Stairs to enter Entrance Stairs-Rails: None Technical brewer of Steps: 1 Bathroom Shower/Tub: Public librarian, Architectural technologist:  Programmer, systems: Yes Home Care Services: No Additional Comments: grab bar in shower and hand held shower   Discharge Living Setting Plans for Discharge Living Setting: Patient's home, Lives with (comment) (spouse) Type of Home at Discharge: House Discharge Home Layout: One level Discharge Home Access: Stairs to enter Entrance Stairs-Rails: None Entrance Stairs-Number of Steps: 1 Discharge Bathroom Shower/Tub: Tub/shower unit, Curtain Discharge Bathroom Toilet: Standard Discharge Bathroom Accessibility: Yes How Accessible: Accessible via walker Does the patient have any problems obtaining your medications?: No  Social/Family/Support Systems Patient Roles: Spouse (employee) Contact Information: wife, [name] Anticipated Caregiver: wife Anticipated Ambulance person Information: see above Ability/Limitations of Caregiver: none Caregiver Availability: 24/7 Discharge Plan Discussed with Primary Caregiver: Yes Is Caregiver In Agreement with Plan?: Yes Does Caregiver/Family have Issues with Lodging/Transportation while Pt is in Rehab?: No  Goals Patient/Family Goal for Rehab: Mod I to supervision wiht PT and OT Expected length of stay: ELOS 7 to 10 days Pt/Family Agrees to Admission and willing to participate: Yes Program Orientation Provided & Reviewed with Pt/Caregiver Including Roles  & Responsibilities: Yes   Decrease burden of Care through IP rehab admission:n/a  Possible need for SNF placement upon discharge: not anticipated  Patient Condition: I have reviewed medical [facility] , spoken with CM, and patient and spouse. I met with patient at the bedside for inpatient rehabilitation assessment.  Patient will benefit from ongoing PT and OT, can actively participate in 3 hours of therapy a day 5 days of the week, and can make measurable gains during the admission.  Patient will also benefit from the coordinated team approach during an  Inpatient Acute Rehabilitation admission.  The patient will receive intensive therapy as well as Rehabilitation physician, nursing, social worker, and care management interventions.  Due to bladder management, bowel management, safety, skin/wound care, disease management, medication administration, pain management and patient education the patient requires 24 hour a day rehabilitation nursing.  The patient is currently mod assist with mobility and basic ADLs.  Discharge setting and therapy post discharge at home with home health is anticipated.  Patient has agreed to participate in the Acute Inpatient Rehabilitation Program and will admit today.  Preadmission Screen Completed By:  Cleatrice Burke, 09/03/2019 3:41 PM ______________________________________________________________________   Discussed status with Dr. Naaman Plummer on 09/03/2019 at 1540 and received approval for admission today.  Admission Coordinator:  Cleatrice Burke, RN, time 1540 Date  09/03/2019   Assessment/Plan: Diagnosis:right bka 1. Does the need for close, 24 hr/day Medical supervision in concert with the patient's rehab needs make it unreasonable for this patient to be served in a less intensive setting? Yes 2. Co-Morbidities requiring supervision/potential complications: obesity, htn, dm 3. Due to bladder management, bowel management, safety, skin/wound care, disease management, medication administration, pain management and patient education, does the patient require 24 hr/day rehab nursing? Yes 4. Does the patient require coordinated care of a physician, rehab nurse, PT, OT, and SLP to address physical and functional deficits in the context of the above medical diagnosis(es)? Yes Addressing deficits in the following areas: balance, endurance, locomotion, strength, transferring, bowel/bladder control, bathing, dressing, feeding, grooming, toileting and psychosocial support 5. Can the patient actively participate in an  intensive therapy program of at least 3 hrs of therapy 5 days a week? Yes 6. The potential for patient to make measurable gains while on inpatient rehab is excellent 7. Anticipated functional outcomes upon discharge from inpatient rehab: modified independent and supervision PT, modified independent and supervision OT, n/a SLP 8. Estimated rehab length of stay to reach the above functional goals is: 7-10 days 9. Anticipated discharge destination: Home 10. Overall Rehab/Functional Prognosis: excellent   MD Signature: Meredith Staggers, MD, [facility] 09/03/2019

## 2019-09-02 NOTE — H&P (Signed)
Physical Medicine and Rehabilitation Admission H&P    Chief Complaint  Patient presents with  . Wound Check  . Wound Infection  : HPI: [name] is a 59 year old right-handed male with history of diabetes mellitus, hyperlipidemia, morbid obesity with BMI 33.04, hypertension, hepatitis C and tobacco abuse.  Per chart review patient lives with spouse.  1 level home one-step to entry.  Patient reportedly rather sedentary due to right heel ulcer.  Presented 08/26/2019 with gangrenous changes right calcaneus and recently underwent incision and drainage 08/20/2019 by Dr. Lajoyce Cornersuda on an outpatient basis.  Presented with increasing pain and swelling of right heel as well as low-grade fever.  MRI of the right ankle and foot showed soft tissue ulcer posterior medial aspect of the hindfoot at the level of the posterior calcaneus no evidence of osteomyelitis or abscess.  Admission chemistry sodium 131, glucose 129, creatinine 0.57, WBC 10,800, hemoglobin 11.3.  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 Lajoyce Cornersuda.  Wound VAC applied as directed.  Initially maintained on Zosyn changed to Rocephin and Flagyl initial cultures growing gram-positive cocci gram-negative rods blood cultures negative.  Postoperative hemoglobin 11.6.  Placed on Lovenox for DVT prophylaxis.  Therapy evaluations completed and patient was admitted for a comprehensive rehab program.  Review of Systems  Constitutional: Positive for fever.  HENT: Negative for hearing loss.   Eyes: Negative for blurred vision and double vision.  Respiratory: Negative for cough and shortness of breath.   Cardiovascular: Positive for leg swelling. Negative for chest pain and palpitations.  Gastrointestinal: Positive for constipation. Negative for heartburn, nausea and vomiting.  Genitourinary: Positive for urgency. Negative for dysuria, flank pain and hematuria.  Musculoskeletal: Positive for joint pain and  myalgias.  Skin: Negative for rash.  Psychiatric/Behavioral: Positive for depression.  All other systems reviewed and are negative.  Past Medical History:  Diagnosis Date  . Diabetic neuropathy ([name])   . Diverticulitis   . History of hepatitis C    has been treated in the past  . History of kidney stones   . Hyperlipidemia   . Hypertension   . Other testicular hypofunction   . Type II or unspecified type diabetes mellitus without mention of complication, not stated as uncontrolled   . Vitamin D deficiency    Past Surgical History:  Procedure Laterality Date  . CHOLECYSTECTOMY  1987  . COLONOSCOPY    . I & D EXTREMITY Right 08/20/2019   Procedure: IRRIGATION & DEBRIDEMENT RIGHT FOOT PARTIAL CALCANEAL EXCISION;  Surgeon: Nadara Mustarduda, Marcus V, MD;  Location: [facility] OR;  Service: Orthopedics;  Laterality: Right;  . I & D EXTREMITY Right 08/27/2019   Procedure: IRRIGATION AND DEBRIDEMENT  OF FOOT;  Surgeon: Tarry KosXu, Naiping M, MD;  Location: [facility] OR;  Service: Orthopedics;  Laterality: Right;  . ORIF TIBIA FRACTURE     x 3 right leg   Family History  Problem Relation Age of Onset  . Hypertension Mother   . Cancer Father        colon  . Alzheimer's disease Father   . Stroke Father    Social History:  reports that he has been smoking cigarettes. He has been smoking about 1.00 pack per day. He has never used smokeless tobacco. He reports current drug use. Drug: Marijuana. He reports that he does not drink alcohol. Allergies:  Allergies  Allergen Reactions  . Invokamet [Canagliflozin-Metformin Hcl] Other (See Comments)    Extremity edema/caused pain  .  Invokana [Canagliflozin] Other (See Comments)    Extremity edema/caused pain  . Codeine Camsylate [Codeine] Rash  . Morphine And Related Rash   Medications Prior to Admission  Medication Sig Dispense Refill  . aspirin 81 MG tablet Take 81 mg by mouth daily.    . cefdinir (OMNICEF) 300 MG capsule Take 1 capsule (300 mg total) by mouth 2 (two)  times daily. 60 capsule 0  . Cholecalciferol 50000 units TABS Take 1 tablet by mouth 2 (two) times a week.     . citalopram (CELEXA) 40 MG tablet Take 1 tablet Daily for Mood & Anxiety (Patient taking differently: Take 40 mg by mouth daily. For mood & anxiety) 90 tablet 0  . gabapentin (NEURONTIN) 800 MG tablet Take 1/2 to 1 tablet     2 to 3 x /day as needed for Neuropathy Pains (Patient taking differently: Take 800-1,600 mg by mouth See admin instructions. Take 1 tablet (800mg ) by mouth in the morning and 2 tablets (1600mg ) by mouth in the evening.) 360 tablet 0  . glipiZIDE (GLUCOTROL) 5 MG tablet Take 1 tablet 3 x /day with Meals for Diabetes (Patient taking differently: 5 mg 2 (two) times daily with a meal. ) 270 tablet 0  . hydrochlorothiazide (HYDRODIURIL) 25 MG tablet Take 1 tablet by mouth once daily (Patient taking differently: Take 25 mg by mouth daily. ) 90 tablet 0  . lisinopril (ZESTRIL) 20 MG tablet Take 1 tablet Daily for BP & Diabetic Kidney Protection (Patient taking differently: Take 20 mg by mouth daily. For blood pressure & diabetic kidney protection) 90 tablet 0  . meclizine (ANTIVERT) 25 MG tablet 1/2-1 pill up to 3 times daily for motion sickness/dizziness (Patient taking differently: Take 25 mg by mouth every evening. ) 90 tablet 2  . metFORMIN (GLUCOPHAGE-XR) 500 MG 24 hr tablet Take 1/2 to 1 or  2 tablets 2 x /day with Meals for Diabetes for Diabetes (Patient taking differently: Take 500 mg by mouth 2 (two) times daily with a meal. For diabetes) 120 tablet 2  . oxyCODONE-acetaminophen (PERCOCET) 5-325 MG tablet Take 1 tablet by mouth every 4 (four) hours as needed. (Patient taking differently: Take 1 tablet by mouth every 4 (four) hours as needed for moderate pain or severe pain. ) 30 tablet 0  . STEGLATRO 15 MG TABS Take 15 mg by mouth daily before breakfast. 90 tablet 3  . verapamil (CALAN-SR) 240 MG CR tablet Take 1 tablet Daily with Food for BP & Heart  Rhythm (Patient  taking differently: Take 240 mg by mouth daily. For blood pressure & heart rhythm. Take with food) 90 tablet 1  . doxycycline (VIBRA-TABS) 100 MG tablet Take 1 tablet (100 mg total) by mouth 2 (two) times daily. (Patient not taking: Reported on 08/26/2019) 60 tablet 0  . meloxicam (MOBIC) 15 MG tablet Take one daily with food for 2 weeks, can take with tylenol, can not take with aleve, iburpofen, then as needed daily for pain (Patient not taking: Reported on 08/26/2019) 30 tablet 1    Drug Regimen Review Drug regimen was reviewed and remains appropriate no significant issues identified  Home: Home Living Family/patient expects to be discharged to:: Private residence Living Arrangements: Spouse/significant other Available Help at Discharge: Family Type of Home: House Home Access: Stairs to enter 10/26/2019 of Steps: 1 Entrance Stairs-Rails: None Home Layout: One level Bathroom Shower/Tub: Tub/shower unit, [phone]: Standard Bathroom Accessibility: Yes Home Equipment: Grab bars - tub/shower, Hand held shower  head Additional Comments: grab bar in shower and hand held shower    Functional History: Prior Function Level of Independence: Needs assistance Gait / Transfers Assistance Needed: Walking with a cane; limited household distances over past 2-3 months b/c of R foot pain; prior to that he was completely independent with gait (community ambulation), ADLs, and IADLs ADL's / Homemaking Assistance Needed: independent  Functional Status:  Mobility: Bed Mobility Overal bed mobility: Needs Assistance Bed Mobility: Supine to Sit Supine to sit: Supervision, HOB elevated Sit to supine: Min guard, HOB elevated General bed mobility comments: supervision for safety Transfers Overall transfer level: Needs assistance Equipment used: Rolling walker (2 wheeled) Transfers: Sit to/from Stand, [employer] Transfers Sit to Stand: Mod assist Stand pivot transfers: Min  assist General transfer comment: Standing from chair (non elevated) required mod A and 2 attempts.  Pt recalled safe hand placement cues from OT.  Min A for steadying for pivot once upright.  Cued on pivots to R side for increase ease at this time. Ambulation/Gait Ambulation/Gait assistance: Mod assist Gait Distance (Feet): 5 Feet Assistive device: Rolling walker (2 wheeled) Gait Pattern/deviations: Step-to pattern, Trunk flexed General Gait Details: deferred Gait velocity: decreased     ADL: ADL Overall ADL's : Needs assistance/impaired Eating/Feeding: Modified independent, Sitting Grooming: Set up, Sitting Upper Body Bathing: Min guard, Sitting (sitting at EOB ) Lower Body Bathing: Minimal assistance, Sitting/lateral leans Upper Body Dressing : Min guard, Sitting Lower Body Dressing: Minimal assistance, Sitting/lateral leans Lower Body Dressing Details (indicate cue type and reason): assist to don sock sitting EOB Toilet Transfer: Stand-pivot, RW, Minimal assistance Toilet Transfer Details (indicate cue type and reason): simulated from elevated EOB to recliner Toileting- Clothing Manipulation and Hygiene: Minimal assistance, Sit to/from stand Toileting - Clothing Manipulation Details (indicate cue type and reason): from EOB elevated Functional mobility during ADLs: Rolling walker, Minimal assistance General ADL Comments: sat EOB with good control;stand-pivot transfer to recliner pt pivoting foot on floor;educated pt on activity progression, fall prevention,   Cognition: Cognition Overall Cognitive Status: Within Functional Limits for tasks assessed Orientation Level: Oriented X4 Cognition Arousal/Alertness: Awake/alert Behavior During Therapy: WFL for tasks assessed/performed Overall Cognitive Status: Within Functional Limits for tasks assessed  Physical Exam: Blood pressure 127/67, pulse 63, temperature (!) 97.4 F (36.3 C), temperature source Oral, resp. rate 17, height 6'  3" (1.905 m), weight 119.9 kg, SpO2 95 %. Physical Exam Constitutional:      Comments: obese  HENT:     Head: Normocephalic.     Right Ear: External ear normal.     Left Ear: External ear normal.  Cardiovascular:     Rate and Rhythm: Normal rate and regular rhythm.  Pulmonary:     Effort: Pulmonary effort is normal.  Abdominal:     General: Bowel sounds are normal.     Palpations: Abdomen is soft.     Tenderness: There is no abdominal tenderness.  Musculoskeletal:     Cervical back: Normal range of motion.     Comments: Right leg tender with movement and touch  Skin:    General: Skin is warm.     Comments: Wound VAC in place to BKA.  Appropriately tender  Neurological:     General: No focal deficit present.     Mental Status: He is oriented to person, place, and time.     Cranial Nerves: No cranial nerve deficit.     Comments: Patient is alert in no acute distress.  Follows commands.  Oriented  x3.  Psychiatric:        Mood and Affect: Mood normal.     Results for orders placed or performed during the hospital encounter of 08/26/19 (from the past 48 hour(s))  Glucose, capillary     Status: Abnormal   Collection Time: 08/31/19  4:28 PM  Result Value Ref Range   Glucose-Capillary 133 (H) 70 - 99 mg/dL    Comment: Glucose reference range applies only to samples taken after fasting for at least 8 hours.  Glucose, capillary     Status: Abnormal   Collection Time: 08/31/19  8:33 PM  Result Value Ref Range   Glucose-Capillary 104 (H) 70 - 99 mg/dL    Comment: Glucose reference range applies only to samples taken after fasting for at least 8 hours.  Glucose, capillary     Status: Abnormal   Collection Time: 09/01/19  6:53 AM  Result Value Ref Range   Glucose-Capillary 117 (H) 70 - 99 mg/dL    Comment: Glucose reference range applies only to samples taken after fasting for at least 8 hours.  CBC     Status: Abnormal   Collection Time: 09/01/19 10:46 AM  Result Value Ref Range    WBC 6.7 4.0 - 10.5 K/uL   RBC 3.82 (L) 4.22 - 5.81 MIL/uL   Hemoglobin 11.8 (L) 13.0 - 17.0 g/dL   HCT 41.6 (L) 39 - 52 %   MCV 94.8 80.0 - 100.0 fL   MCH 30.9 26.0 - 34.0 pg   MCHC 32.6 30.0 - 36.0 g/dL   RDW 60.6 30.1 - 60.1 %   Platelets 292 150 - 400 K/uL   nRBC 0.0 0.0 - 0.2 %    Comment: Performed at [facility], [address], [place], [place] [address]  Basic metabolic panel     Status: Abnormal   Collection Time: 09/01/19 10:46 AM  Result Value Ref Range   Sodium 135 135 - 145 mmol/L   Potassium 4.2 3.5 - 5.1 mmol/L   Chloride 101 98 - 111 mmol/L   CO2 26 22 - 32 mmol/L   Glucose, Bld 110 (H) 70 - 99 mg/dL    Comment: Glucose reference range applies only to samples taken after fasting for at least 8 hours.   BUN 8 6 - 20 mg/dL   Creatinine, Ser 5.57 (L) 0.61 - 1.24 mg/dL   Calcium 8.2 (L) 8.9 - 10.3 mg/dL   GFR calc non Af Amer >60 >60 mL/min   GFR calc Af Amer >60 >60 mL/min   Anion gap 8 5 - 15    Comment: Performed at [facility], [address] [place], [place] [address]  Glucose, capillary     Status: Abnormal   Collection Time: 09/01/19 11:39 AM  Result Value Ref Range   Glucose-Capillary 109 (H) 70 - 99 mg/dL    Comment: Glucose reference range applies only to samples taken after fasting for at least 8 hours.  Glucose, capillary     Status: Abnormal   Collection Time: 09/01/19  1:29 PM  Result Value Ref Range   Glucose-Capillary 102 (H) 70 - 99 mg/dL    Comment: Glucose reference range applies only to samples taken after fasting for at least 8 hours.  Glucose, capillary     Status: None   Collection Time: 09/01/19  4:25 PM  Result Value Ref Range   Glucose-Capillary 99 70 - 99 mg/dL    Comment: Glucose reference range applies only to  samples taken after fasting for at least 8 hours.  Aerobic/Anaerobic Culture (surgical/deep wound)     Status: None (Preliminary result)   Collection Time: 09/01/19  5:18 PM   Specimen: PATH Other; Tissue   Result Value Ref Range   Specimen Description TISSUE RIGHT LOWER LEG    Special Requests NONE    Gram Stain      MODERATE WBC PRESENT, PREDOMINANTLY PMN NO ORGANISMS SEEN    Culture      NO GROWTH < 24 HOURS Performed at [facility], [address], [place], [place] [address]    Report Status PENDING   Glucose, capillary     Status: Abnormal   Collection Time: 09/01/19  5:47 PM  Result Value Ref Range   Glucose-Capillary 112 (H) 70 - 99 mg/dL    Comment: Glucose reference range applies only to samples taken after fasting for at least 8 hours.  Glucose, capillary     Status: Abnormal   Collection Time: 09/02/19  6:39 AM  Result Value Ref Range   Glucose-Capillary 146 (H) 70 - 99 mg/dL    Comment: Glucose reference range applies only to samples taken after fasting for at least 8 hours.  Glucose, capillary     Status: Abnormal   Collection Time: 09/02/19 11:32 AM  Result Value Ref Range   Glucose-Capillary 213 (H) 70 - 99 mg/dL    Comment: Glucose reference range applies only to samples taken after fasting for at least 8 hours.   DG MINI C-ARM IMAGE ONLY  Result Date: 09/01/2019 There is no interpretation for this exam.  This order is for images obtained during a surgical procedure.  Please See "Surgeries" Tab for more information regarding the procedure.       Medical Problem List and Plan: 1.  Decreased functional mobility secondary to [name] 08/31/2019.     -patient may not yet shower  -ELOS/Goals: 7-10 days, mod I to supervision   -admit to inpatient rehab 2.  Antithrombotics: -DVT/anticoagulation: Lovenox  -antiplatelet therapy: Aspirin 81 mg daily 3. Pain Management: Tramadol 50 mg every 6 hours, oxycodone  and Robaxin as needed 4.  Mood.  Celexa 20 mg daily 5.  Neuropsychology.  Patient is capable of making decisions on his own behalf 6.  Skin/wound.  Routine skin checks  -wound vac for 7 days post-op 7.  Fluids/electrolytes/nutrition.  Strict I&O's  with follow-up chemistries ordered 8.  Acute blood loss anemia.  Follow-up CBC 9.  Diabetes mellitus with peripheral neuropathy.  Hemoglobin A1c 6.6.  SSI.  Patient on Glucotrol 5 mg twice daily prior to admission as well as Glucophage 500 mg 1 tablet twice daily.  Resume as needed 10.  Hypertension.  Verapamil 240 mg daily  -monitor variance with therapy/pain 11.  Tobacco abuse.  Counseling    Charlton Amor, PA-C 09/02/2019

## 2019-09-02 NOTE — Evaluation (Signed)
Occupational Therapy Re-Evaluation Patient Details Name: [name] MRN: [id number] DOB: 1960/04/23 Today's Date: 09/02/2019    History of Present Illness  Pt is a 59 y.o. male s/p [name]. PMH includes but not limited to:  hypertension, type 2 diabetes mellitus multiple other comorbidities including diagnosis of osteomyelitis of right calcaneus and abscess of the right calcaneus is status post incision and drainage done on 08/20/2019   Clinical Impression   Pt s/p R BKA 8/11. Pt currently reporting pain 4/10 in RLE. Pt required supervision to progress to EOB. He required minA for LB dressing, to stand from elevated surface and pivot to recliner. Educate pt on importance of positioning RLE, and RLE therex in preparation for prosthetic. Pt will continue to benefit from skilled OT services to maximize safety and independence with ADL/IADL and functional mobility. Will continue to follow acutely and progress as tolerated. Pt would greatly benefit from CIR follow-up to maximize safety and independence with ADL/IADL and functional mobility.     Follow Up Recommendations  CIR    Equipment Recommendations  3 in 1 bedside commode;Tub/shower bench    Recommendations for Other Services       Precautions / Restrictions Precautions Precautions: Fall Restrictions Weight Bearing Restrictions: Yes RLE Weight Bearing: Non weight bearing      Mobility Bed Mobility Overal bed mobility: Needs Assistance Bed Mobility: Supine to Sit     Supine to sit: Supervision;HOB elevated     General bed mobility comments: supervision for safety  Transfers Overall transfer level: Needs assistance Equipment used: Rolling walker (2 wheeled) Transfers: Sit to/from Stand Sit to Stand: Min assist;From elevated surface         General transfer comment: minA for safe hand placement and minA for stability with transitioning hand placement to walker;pt required vc for hip extension and full upright  posture    Balance Overall balance assessment: Needs assistance Sitting-balance support: No upper extremity supported Sitting balance-Leahy Scale: Good     Standing balance support: Bilateral upper extremity supported Standing balance-Leahy Scale: Poor Standing balance comment: heavy reliance on BUE support                           ADL either performed or assessed with clinical judgement   ADL Overall ADL's : Needs assistance/impaired Eating/Feeding: Modified independent;Sitting   Grooming: Set up;Sitting   Upper Body Bathing: Min guard;Sitting (sitting at EOB )   Lower Body Bathing: Minimal assistance;Sitting/lateral leans   Upper Body Dressing : Min guard;Sitting   Lower Body Dressing: Minimal assistance;Sitting/lateral leans Lower Body Dressing Details (indicate cue type and reason): assist to don sock sitting EOB Toilet Transfer: Stand-pivot;RW;Minimal assistance Toilet Transfer Details (indicate cue type and reason): simulated from elevated EOB to recliner Toileting- Clothing Manipulation and Hygiene: Minimal assistance;Sit to/from stand Toileting - Clothing Manipulation Details (indicate cue type and reason): from EOB elevated     Functional mobility during ADLs: Rolling walker;Minimal assistance General ADL Comments: sat EOB with good control;stand-pivot transfer to recliner pt pivoting foot on floor;educated pt on activity progression, fall prevention,      Vision Baseline Vision/History: No visual deficits Patient Visual Report: No change from baseline Vision Assessment?: No apparent visual deficits     Perception Perception Perception Tested?: No   Praxis      Pertinent Vitals/Pain Pain Assessment: 0-10 Pain Score: 4  Pain Location: RLE Pain Descriptors / Indicators: Aching;Discomfort Pain Intervention(s): Monitored during session;Limited activity within patient's tolerance  Hand Dominance Left   Extremity/Trunk Assessment Upper  Extremity Assessment Upper Extremity Assessment: LUE deficits/detail LUE Deficits / Details: per pt injured LUE in october;shoulder flexion AROM limited due to pain;strength grossly 4/5 LUE Sensation: WNL LUE Coordination: WNL   Lower Extremity Assessment Lower Extremity Assessment: RLE deficits/detail RLE Deficits / Details: s/p BKA RLE: Unable to fully assess due to pain;Unable to fully assess due to immobilization   Cervical / Trunk Assessment Cervical / Trunk Assessment: Normal   Communication Communication Communication: No difficulties   Cognition Arousal/Alertness: Awake/alert Behavior During Therapy: WFL for tasks assessed/performed Overall Cognitive Status: Within Functional Limits for tasks assessed                                     General Comments  wound vac    Exercises Exercises: Amputee Amputee Exercises Quad Sets: Both;10 reps;Seated Gluteal Sets: 10 reps;Seated Knee Extension: Right;10 reps;Seated   Shoulder Instructions      Home Living Family/patient expects to be discharged to:: Private residence Living Arrangements: Spouse/significant other Available Help at Discharge: Family (wife and reports they willbe able to suport pt ) Type of Home: House Home Access: Stairs to enter Secretary/administrator of Steps: 1 Entrance Stairs-Rails: None Home Layout: One level     Bathroom Shower/Tub: IT trainer: Standard Bathroom Accessibility: Yes   Home Equipment: None   Additional Comments: grab bar in shower and hand held shower       Prior Functioning/Environment Level of Independence: Needs assistance  Gait / Transfers Assistance Needed: using walking stick with limited walking ADL's / Homemaking Assistance Needed: independent            OT Problem List: Decreased strength;Decreased range of motion;Decreased activity tolerance;Impaired balance (sitting and/or standing);Decreased safety  awareness;Impaired sensation;Pain      OT Treatment/Interventions: Self-care/ADL training;Therapeutic exercise;Energy conservation;DME and/or AE instruction;Therapeutic activities;Patient/family education;Balance training    OT Goals(Current goals can be found in the care plan section) Acute Rehab OT Goals Patient Stated Goal: to continue to move  OT Goal Formulation: With patient Time For Goal Achievement: 09/16/19 Potential to Achieve Goals: Good ADL Goals Additional ADL Goal #1: Pt will demonstrate independence with 3 desensitization and pain management strategies.  OT Frequency: Min 2X/week   Barriers to D/C:            Co-evaluation              AM-PAC OT "6 Clicks" Daily Activity     Outcome Measure Help from another person eating meals?: None Help from another person taking care of personal grooming?: A Little Help from another person toileting, which includes using toliet, bedpan, or urinal?: A Lot Help from another person bathing (including washing, rinsing, drying)?: A Lot Help from another person to put on and taking off regular upper body clothing?: A Little Help from another person to put on and taking off regular lower body clothing?: A Lot 6 Click Score: 16   End of Session Equipment Utilized During Treatment: Gait belt;Rolling walker Nurse Communication: Mobility status  Activity Tolerance: Patient tolerated treatment well;Patient limited by fatigue Patient left: with call bell/phone within reach;in chair (pt verbalized understanding to call for assistance prior)  OT Visit Diagnosis: Unsteadiness on feet (R26.81);Other abnormalities of gait and mobility (R26.89);Repeated falls (R29.6);Pain Pain - Right/Left: Right Pain - part of body: Leg  Time: 5027-7412 OT Time Calculation (min): 31 min Charges:  OT General Charges $OT Visit: 1 Visit OT Evaluation $OT Re-eval: 1 Re-eval OT Treatments $Self Care/Home Management : 8-22 mins  [name]  OTR/L Acute Rehabilitation Services Office: [phone]   Rebeca Alert 09/02/2019, 9:47 AM

## 2019-09-02 NOTE — Progress Notes (Signed)
Patient is postop day 1 status post below-knee amputation. He is overall doing well he sitting in bed appears very comfortable.  Patient will work with physical therapy today await their recommendations of CIR versus SNF. 0 cc in VAC canister

## 2019-09-02 NOTE — Progress Notes (Signed)
Inpatient Rehabilitation Admissions Coordinator  I await postoperative PT and OT evals to assist with disposition. I will follow up today.  Ottie Glazier, RN, MSN Rehab Admissions Coordinator [phone] 09/02/2019 7:54 AM

## 2019-09-02 NOTE — Progress Notes (Addendum)
Inpatient Rehabilitation Admissions Coordinator  I will begin insurance authorization for a possible CIR admit.  Danne Baxter, RN, MSN Rehab Admissions Coordinator [phone] 09/02/2019 11:51 AM   I met with patient and his wife at bedside and they are in agreement to dispo plan.  Danne Baxter, RN, MSN Rehab Admissions Coordinator [phone] 09/02/2019 12:47 PM

## 2019-09-03 ENCOUNTER — Inpatient Hospital Stay (HOSPITAL_COMMUNITY)
Admission: RE | Admit: 2019-09-03 | Discharge: 2019-09-15 | DRG: 560 | Disposition: A | Discharge status: Home Health | Payer: BC Managed Care – PPO | Source: Intra-hospital | Attending: Physical Medicine & Rehabilitation | Admitting: Physical Medicine & Rehabilitation

## 2019-09-03 ENCOUNTER — Encounter (HOSPITAL_COMMUNITY): Payer: Self-pay | Admitting: Physical Medicine & Rehabilitation

## 2019-09-03 DIAGNOSIS — E1142 Type 2 diabetes mellitus with diabetic polyneuropathy: Secondary | ICD-10-CM | POA: Diagnosis not present

## 2019-09-03 DIAGNOSIS — K219 Gastro-esophageal reflux disease without esophagitis: Secondary | ICD-10-CM | POA: Diagnosis not present

## 2019-09-03 DIAGNOSIS — B1920 Unspecified viral hepatitis C without hepatic coma: Secondary | ICD-10-CM | POA: Diagnosis not present

## 2019-09-03 DIAGNOSIS — D62 Acute posthemorrhagic anemia: Secondary | ICD-10-CM | POA: Diagnosis not present

## 2019-09-03 DIAGNOSIS — Z9049 Acquired absence of other specified parts of digestive tract: Secondary | ICD-10-CM

## 2019-09-03 DIAGNOSIS — Z79899 Other long term (current) drug therapy: Secondary | ICD-10-CM

## 2019-09-03 DIAGNOSIS — I10 Essential (primary) hypertension: Secondary | ICD-10-CM | POA: Diagnosis present

## 2019-09-03 DIAGNOSIS — Z7984 Long term (current) use of oral hypoglycemic drugs: Secondary | ICD-10-CM

## 2019-09-03 DIAGNOSIS — Z4781 Encounter for orthopedic aftercare following surgical amputation: Principal | ICD-10-CM

## 2019-09-03 DIAGNOSIS — Z89511 Acquired absence of right leg below knee: Secondary | ICD-10-CM | POA: Diagnosis not present

## 2019-09-03 DIAGNOSIS — E1151 Type 2 diabetes mellitus with diabetic peripheral angiopathy without gangrene: Secondary | ICD-10-CM | POA: Diagnosis not present

## 2019-09-03 DIAGNOSIS — F17210 Nicotine dependence, cigarettes, uncomplicated: Secondary | ICD-10-CM | POA: Diagnosis present

## 2019-09-03 DIAGNOSIS — E1169 Type 2 diabetes mellitus with other specified complication: Secondary | ICD-10-CM | POA: Diagnosis not present

## 2019-09-03 DIAGNOSIS — Z7982 Long term (current) use of aspirin: Secondary | ICD-10-CM

## 2019-09-03 DIAGNOSIS — L03115 Cellulitis of right lower limb: Secondary | ICD-10-CM | POA: Diagnosis not present

## 2019-09-03 DIAGNOSIS — E785 Hyperlipidemia, unspecified: Secondary | ICD-10-CM | POA: Diagnosis not present

## 2019-09-03 DIAGNOSIS — I493 Ventricular premature depolarization: Secondary | ICD-10-CM

## 2019-09-03 DIAGNOSIS — Z6833 Body mass index (BMI) 33.0-33.9, adult: Secondary | ICD-10-CM | POA: Diagnosis not present

## 2019-09-03 DIAGNOSIS — E1342 Other specified diabetes mellitus with diabetic polyneuropathy: Secondary | ICD-10-CM

## 2019-09-03 DIAGNOSIS — I739 Peripheral vascular disease, unspecified: Secondary | ICD-10-CM

## 2019-09-03 LAB — GLUCOSE, CAPILLARY
Glucose-Capillary: 140 mg/dL — ABNORMAL HIGH (ref 70–99)
Glucose-Capillary: 122 mg/dL — ABNORMAL HIGH (ref 70–99)
Glucose-Capillary: 155 mg/dL — ABNORMAL HIGH (ref 70–99)
Glucose-Capillary: 144 mg/dL — ABNORMAL HIGH (ref 70–99)
Glucose-Capillary: 156 mg/dL — ABNORMAL HIGH (ref 70–99)

## 2019-09-03 LAB — CBC
HCT: 35.2 % — ABNORMAL LOW (ref 39.0–52.0)
Hemoglobin: 11.6 g/dL — ABNORMAL LOW (ref 13.0–17.0)
MCH: 31.4 pg (ref 26.0–34.0)
MCHC: 33 g/dL (ref 30.0–36.0)
MCV: 95.4 fL (ref 80.0–100.0)
Platelets: 356 10*3/uL (ref 150–400)
RBC: 3.69 MIL/uL — ABNORMAL LOW (ref 4.22–5.81)
RDW: 14.3 % (ref 11.5–15.5)
WBC: 9.4 10*3/uL (ref 4.0–10.5)
nRBC: 0 % (ref 0.0–0.2)

## 2019-09-03 LAB — BASIC METABOLIC PANEL
Anion gap: 7 (ref 5–15)
BUN: 11 mg/dL (ref 6–20)
CO2: 26 mmol/L (ref 22–32)
Calcium: 7.8 mg/dL — ABNORMAL LOW (ref 8.9–10.3)
Chloride: 101 mmol/L (ref 98–111)
Creatinine, Ser: 0.66 mg/dL (ref 0.61–1.24)
GFR calc Af Amer: >60 mL/min (ref >60)
GFR calc non Af Amer: >60 mL/min (ref >60)
Glucose, Bld: 124 mg/dL — ABNORMAL HIGH (ref 70–99)
Potassium: 4.3 mmol/L (ref 3.5–5.1)
Sodium: 134 mmol/L — ABNORMAL LOW (ref 135–145)

## 2019-09-03 LAB — MAGNESIUM: Magnesium: 2 mg/dL (ref 1.7–2.4)

## 2019-09-03 LAB — PHOSPHORUS: Phosphorus: 3 mg/dL (ref 2.5–4.6)

## 2019-09-03 MED ORDER — ACETAMINOPHEN 325 MG PO TABS
325.0000 mg | ORAL_TABLET | Freq: Four times a day (QID) | ORAL | Status: DC | PRN
  Filled 2019-09-03: qty 2

## 2019-09-03 MED ORDER — TRAMADOL HCL 50 MG PO TABS
50.0000 mg | ORAL_TABLET | Freq: Four times a day (QID) | ORAL | Status: DC
  Administered 2019-09-04 – 2019-09-15 (×38): 50 mg via ORAL
  Filled 2019-09-03 (×49): qty 1

## 2019-09-03 MED ORDER — HYDROMORPHONE HCL 1 MG/ML IJ SOLN: 0.5000 mg | INTRAMUSCULAR | 0 refills | Status: DC | PRN

## 2019-09-03 MED ORDER — OXYCODONE HCL 5 MG PO TABS
10.0000 mg | ORAL_TABLET | ORAL | Status: DC | PRN
  Administered 2019-09-03: 15 mg via ORAL
  Filled 2019-09-03 (×2): qty 3

## 2019-09-03 MED ORDER — ONDANSETRON HCL 4 MG PO TABS
4.0000 mg | ORAL_TABLET | Freq: Four times a day (QID) | ORAL | Status: DC | PRN
  Administered 2019-09-09: 4 mg via ORAL
  Filled 2019-09-03: qty 1

## 2019-09-03 MED ORDER — CITALOPRAM HYDROBROMIDE 10 MG PO TABS
20.0000 mg | ORAL_TABLET | Freq: Every day | ORAL | Status: DC
  Administered 2019-09-04 – 2019-09-15 (×12): 20 mg via ORAL
  Filled 2019-09-03 (×13): qty 2

## 2019-09-03 MED ORDER — POLYETHYLENE GLYCOL 3350 17 G PO PACK: 17.0000 g | PACK | Freq: Every day | ORAL | Status: DC | PRN

## 2019-09-03 MED ORDER — OXYCODONE HCL 5 MG PO TABS
5.0000 mg | ORAL_TABLET | ORAL | Status: DC | PRN
  Administered 2019-09-04: 10 mg via ORAL

## 2019-09-03 MED ORDER — OXYCODONE HCL 5 MG PO TABS: 5.0000 mg | ORAL_TABLET | ORAL | 0 refills | Status: DC | PRN

## 2019-09-03 MED ORDER — INSULIN ASPART 100 UNIT/ML ~~LOC~~ SOLN
0.0000 [IU] | Freq: Three times a day (TID) | SUBCUTANEOUS | Status: DC
  Administered 2019-09-04 – 2019-09-05 (×2): 2 [IU] via SUBCUTANEOUS
  Administered 2019-09-05: 3 [IU] via SUBCUTANEOUS
  Administered 2019-09-06 (×2): 2 [IU] via SUBCUTANEOUS
  Administered 2019-09-07: 3 [IU] via SUBCUTANEOUS
  Administered 2019-09-09 – 2019-09-10 (×4): 2 [IU] via SUBCUTANEOUS
  Administered 2019-09-11: 5 [IU] via SUBCUTANEOUS
  Administered 2019-09-11 – 2019-09-13 (×7): 2 [IU] via SUBCUTANEOUS
  Administered 2019-09-14: 3 [IU] via SUBCUTANEOUS

## 2019-09-03 MED ORDER — METHOCARBAMOL 1000 MG/10ML IJ SOLN
500.0000 mg | Freq: Four times a day (QID) | INTRAVENOUS | Status: DC | PRN
  Filled 2019-09-03: qty 5

## 2019-09-03 MED ORDER — SODIUM CHLORIDE 0.9 % IV SOLN: 2.0000 g | INTRAVENOUS | Status: DC

## 2019-09-03 MED ORDER — VERAPAMIL HCL ER 240 MG PO TBCR
240.0000 mg | EXTENDED_RELEASE_TABLET | Freq: Every day | ORAL | Status: DC
  Administered 2019-09-04 – 2019-09-15 (×12): 240 mg via ORAL
  Filled 2019-09-03 (×12): qty 1

## 2019-09-03 MED ORDER — ENOXAPARIN SODIUM 60 MG/0.6ML ~~LOC~~ SOLN
60.0000 mg | SUBCUTANEOUS | Status: DC
  Administered 2019-09-04 – 2019-09-14 (×11): 60 mg via SUBCUTANEOUS
  Filled 2019-09-03 (×11): qty 0.6

## 2019-09-03 MED ORDER — DOCUSATE SODIUM 100 MG PO CAPS
100.0000 mg | ORAL_CAPSULE | Freq: Two times a day (BID) | ORAL | Status: DC
  Administered 2019-09-04 – 2019-09-07 (×2): 100 mg via ORAL
  Filled 2019-09-03 (×18): qty 1

## 2019-09-03 MED ORDER — ENOXAPARIN SODIUM 60 MG/0.6ML ~~LOC~~ SOLN: 60.0000 mg | SUBCUTANEOUS | Status: DC

## 2019-09-03 MED ORDER — METHOCARBAMOL 500 MG PO TABS
500.0000 mg | ORAL_TABLET | Freq: Four times a day (QID) | ORAL | Status: DC | PRN
  Administered 2019-09-03 – 2019-09-12 (×6): 500 mg via ORAL
  Filled 2019-09-03 (×6): qty 1

## 2019-09-03 MED ORDER — ONDANSETRON HCL 4 MG/2ML IJ SOLN: 4.0000 mg | Freq: Four times a day (QID) | INTRAMUSCULAR | Status: DC | PRN

## 2019-09-03 MED ORDER — TRAMADOL HCL 50 MG PO TABS: 50.0000 mg | ORAL_TABLET | Freq: Four times a day (QID) | ORAL | Status: DC

## 2019-09-03 MED ORDER — METOCLOPRAMIDE HCL 5 MG PO TABS: 5.0000 mg | ORAL_TABLET | Freq: Three times a day (TID) | ORAL | Status: DC | PRN

## 2019-09-03 MED ORDER — METHOCARBAMOL 500 MG PO TABS: 500.0000 mg | ORAL_TABLET | Freq: Four times a day (QID) | ORAL | Status: DC | PRN

## 2019-09-03 MED ORDER — SODIUM CHLORIDE 0.9 % IV SOLN
2.0000 g | INTRAVENOUS | Status: DC
  Administered 2019-09-03 – 2019-09-06 (×4): 2 g via INTRAVENOUS
  Filled 2019-09-03 (×2): qty 2
  Filled 2019-09-03: qty 20
  Filled 2019-09-03 (×2): qty 2

## 2019-09-03 MED ORDER — METRONIDAZOLE IN NACL 5-0.79 MG/ML-% IV SOLN
500.0000 mg | Freq: Three times a day (TID) | INTRAVENOUS | Status: DC
  Administered 2019-09-03 – 2019-09-07 (×11): 500 mg via INTRAVENOUS
  Filled 2019-09-03 (×12): qty 100

## 2019-09-03 MED ORDER — ASPIRIN EC 81 MG PO TBEC
81.0000 mg | DELAYED_RELEASE_TABLET | Freq: Every day | ORAL | Status: DC
  Administered 2019-09-04 – 2019-09-15 (×12): 81 mg via ORAL
  Filled 2019-09-03 (×12): qty 1

## 2019-09-03 MED ORDER — METRONIDAZOLE IN NACL 5-0.79 MG/ML-% IV SOLN: 500.0000 mg | Freq: Three times a day (TID) | INTRAVENOUS | Status: DC

## 2019-09-03 MED ORDER — OXYCODONE HCL 10 MG PO TABS: 10.0000 mg | ORAL_TABLET | ORAL | 0 refills | Status: DC | PRN

## 2019-09-03 NOTE — Progress Notes (Signed)
PT Cancellation Note  Patient Details Name: [name] MRN: [id number] DOB: 08-Jul-1960   Cancelled Treatment:    Reason Eval/Treat Not Completed: (P) Other (comment) (Pt to d/c to CIR today, will defer PT needs to next level of care.)   Kerilyn Cortner Artis Delay 09/03/2019, 5:07 PM  Bonney Leitz , PTA Acute Rehabilitation Services Pager [phone] Office [phone]

## 2019-09-03 NOTE — Discharge Instructions (Signed)
Patient underwent [name]. Advised to continue IV antibiotics until cultures comes back.  Then oral antibiotics for 2 more weeks. Follow-up orthopedics as scheduled.

## 2019-09-03 NOTE — Discharge Summary (Signed)
Physician Discharge Summary  [name] HKV:[id number] DOB: [birth year] DOA: 08/26/2019  PCP: [name], MD  Admit date: 08/26/2019  Discharge date: 09/03/2019  Admitted From: Home. Disposition:  CIR (inpatient rehab)  Recommendations for Outpatient Follow-up:  1. Follow up with PCP in 1-2 weeks. 2. Please obtain BMP/CBC in one week. 3.   Patient underwent [name]. 4.   Advised to continue IV antibiotics until cultures comes back.  Then oral antibiotics for 2 more weeks. 5.   Follow-up orthopedics as scheduled.  Home Health: No. Equipment/Devices: None  Discharge Condition: Stable CODE STATUS:Full code Diet recommendation: Heart Healthy   Brief Summary : [name] is a 59 y.o. male with medical history significant of hypertension, type 2 diabetes mellitus,  multiple other comorbidities including diagnosis of osteomyelitis of right calcaneus and abscess of the right calcaneus is status post incision and drainage done on 08/20/2019 by [name] on outpatient basis and discharged home the same day presented back to ED with the complaint of worsening pain and swelling of the right heel.  According to patient, he was discharged home with no antibiotics however wound VAC was attached.  Wound VAC was dislodged on Sunday night by accident.  He followed up with Dr. [name] office on Monday and was seen by PA and was told that his wound looked better.  Subsequently, yesterday he received a call from Dr. [name] PA that his culture from the abscess is growing bacteria so he was prescribed Omnicef which he took last night and this morning however he started having worsening redness, pain and swelling of the right foot along with chills fever and sweating yesterday which persisted today so he came to the emergency department.  T-max was 102.  Other than that he does not have any other complaint. In ED: patient had some tachypnea as well as tachycardia.  Has significant leukocytosis with white  cells of 26.6 as compared to normal last week.  He was diagnosed with severe sepsis based on tachycardia, tachypnea, leukocytosis and lactic acid of 2.2.  He received some antibiotics in the ED. Orthopedic on call/Dr. Blaine Hamper was consulted and admission was deferred to hospital service. Covid swab negative - patient is unvaccinated.  Hospital course Patient was admitted for severe sepsis secondary to right calcaneal cellulitis versus osteomyelitis.  Patient was started on IV antibiotics, orthopedics was consulted. MRI showed soft tissue ulcer without evidence of osteomyelitis or abscess, Ortho recommended below-knee amputation. Patient underwent right below-knee amputation,  Removal of deep retained hardware IM nail, Application of Prevena wound VAC customizable and arthroform. Tissue and abscess sent for cultures, tolerated well. PT and OT recommended inpatient rehab.    Patient participated in physical therapy, reports pain is better controlled.  Patient got approved for inpatient rehab.  Patient has been discharged to CIR.  As per orthopedics patient will continue IV antibiotics until cultures comes back and then oral antibiotic for 2 more weeks.  Patient will follow up with orthopedics and wound care for 1 week.  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  -MRI showed soft tissue ulcer without evidence of osteomyelitis or abscess -Orthopedic surgery following in consult.   -POD 2 SYME amp - c/w abx and wound vac,   -Underwent rt. BKA by Dr [name] on wed, 8/11. PT recommended CIR. Awaiting insurance authorization. -  Pt lives with wife-has good social support - Continue IV fluids, changed abx from zosyn vanc to ctx and flagyl. - Continue to follow cultures, initial culture from abscess growing gram-positive cocci and gram-negative rods; blood cultures neg.   Essential hypertension: controlled. -Home medications include lisinopril, hydrochlorothiazide and verapamil.   -Continue verapamil only, will restart other medications as BP allows.   Hyponatremia, chronic: Resolved  -Baseline around 133, currently within normal limits. -Continue to hold HCTZ.   Type 2 diabetes mellitus with polyneuropathy, borderline control:  Hold home medications, continue sliding scale insulin, hypoglycemic protocol. Discussed need for medication and dietary compliance at length at bedside   DVT prophylaxis: Lovenox Code Status: Full Family Communication: Wife was at bedside, discussed with patient in detail Disposition Plan:  Dispo: The patient is from: Home              Anticipated d/c is to: CIR              Anticipated d/c date is: > 1-2 days              Patient currently is not medically stable to d/c awaiting insurance authorization    Discharge Instructions  Discharge Instructions     [facility] pharmacist to adjust dose for Vancomycin, Aminoglycosides and other anti-infective therapies as requested by physician.   Complete by: As directed    [facility] to provide Cath Flo 45m   Complete by: As directed    Administer for PICC line occlusion and as ordered by physician for other access device issues.   Anaphylaxis Kit: Provided to treat any anaphylactic reaction to the medication being provided to the patient if First Dose or when requested by physician   Complete by: As directed    Epinephrine 148mml vial / amp: Administer 0.17m12m0.17ml102msubcutaneously once for moderate to severe anaphylaxis, nurse to call physician and pharmacy when reaction occurs and call 911 if needed for immediate care   Diphenhydramine 50mg22mIV vial: Administer 25-50mg 26mM PRN for first dose reaction, rash, itching, mild reaction, nurse to call physician and pharmacy when reaction occurs   Sodium Chloride 0.9% NS 500ml I48mdminister if needed for hypovolemic blood pressure drop or as ordered by physician after call to physician with anaphylactic reaction   Call MD for:  difficulty breathing, headache or visual disturbances   Complete by: As directed    Call MD for:  persistant dizziness or light-headedness   Complete by: As directed    Call MD for:  persistant nausea and vomiting   Complete by: As directed    Call MD for:  redness, tenderness, or signs of infection (pain, swelling, redness, odor or green/yellow discharge around incision site)   Complete by: As directed    Call MD for:  temperature >100.4   Complete by: As directed    Change dressing on IV access line weekly and PRN   Complete by: As directed    Diet - low sodium heart healthy   Complete by: As directed    Discharge instructions   Complete by: As directed    Patient underwent [name]. Advised to continue  IV antibiotics until cultures comes back.  Then oral antibiotics for 2 more weeks. Follow-up orthopedics as scheduled   Discharge wound care:   Complete by: As directed    Follow-up wound care   Flush IV access with Sodium Chloride 0.9% and Heparin 10 units/ml or 100 units/ml   Complete by: As directed    Home infusion instructions - [facility]   Complete by: As directed    Instructions: Flush IV access with Sodium Chloride 0.9% and Heparin 10units/ml or 100units/ml   Change dressing on IV access line: Weekly and PRN   Instructions Cath Flo 27m: Administer for PICC Line occlusion and as ordered by physician for other access device   [facility]  pharmacist to adjust dose for: Vancomycin, Aminoglycosides and other anti-infective therapies as requested by physician   Increase activity slowly   Complete by: As directed    Method of administration may be changed at the discretion of home infusion pharmacist based upon assessment of the patient and/or caregiver's ability to self-administer the medication ordered   Complete by: As directed    Negative Pressure Wound Therapy - Incisional   Complete by: As directed    Show patient how to attach vac      Allergies as of 09/03/2019       Reactions   Invokamet [canagliflozin-metformin Hcl] Other (See Comments)   Extremity edema/caused pain   Invokana [canagliflozin] Other (See Comments)   Extremity edema/caused pain   Codeine Camsylate [codeine] Rash   Morphine And Related Rash        Medication List     STOP taking these medications    cefdinir 300 MG capsule Commonly known as: OMNICEF   Cholecalciferol 1.25 MG (50000 UT) Tabs   doxycycline 100 MG tablet Commonly known as: VIBRA-TABS   gabapentin 800 MG tablet Commonly known as: NEURONTIN   glipiZIDE 5 MG tablet Commonly known as: GLUCOTROL   meclizine 25 MG tablet Commonly known as: ANTIVERT   meloxicam 15 MG tablet Commonly known as: MOBIC   metFORMIN 500 MG 24 hr tablet Commonly known as: GLUCOPHAGE-XR   oxyCODONE-acetaminophen 5-325 MG tablet Commonly known as: Percocet   Steglatro 15 MG Tabs tablet Generic drug: ertugliflozin L-PyroglutamicAc       TAKE these medications    aspirin 81 MG tablet Take 81 mg by mouth daily.   cefTRIAXone 2 g in sodium chloride 0.9 % 100 mL Inject 2 g into the vein daily.   citalopram 40 MG tablet Commonly known as: CELEXA Take 1 tablet Daily for Mood & Anxiety What changed:   how much to take  how to take this  when to take this  additional instructions   enoxaparin 60 MG/0.6ML injection Commonly known as: LOVENOX Inject 0.6 mLs (60 mg total) into  the skin daily.   hydrochlorothiazide 25 MG tablet Commonly known as: HYDRODIURIL Take 1 tablet by mouth once daily   HYDROmorphone 1 MG/ML injection Commonly known as: DILAUDID Inject 0.5-1 mLs (0.5-1 mg total) into the vein every 4 (four) hours as needed for severe pain (pain score 7-10, uncontrolled pain with PO analgesic).   lisinopril 20 MG tablet Commonly known as: ZESTRIL Take 1 tablet Daily for BP & Diabetic Kidney Protection What changed:   how much to take  how to take this  when to take this  additional instructions   methocarbamol 500 MG tablet Commonly known as: ROBAXIN Take 1 tablet (500 mg total) by mouth every 6 (six)  hours as needed for muscle spasms.   metoCLOPramide 5 MG tablet Commonly known as: REGLAN Take 1-2 tablets (5-10 mg total) by mouth every 8 (eight) hours as needed for nausea (if ondansetron (ZOFRAN) ineffective.).   metroNIDAZOLE 5-0.79 MG/ML-% IVPB Commonly known as: FLAGYL Inject 100 mLs (500 mg total) into the vein every 8 (eight) hours.   Oxycodone HCl 10 MG Tabs Take 1-1.5 tablets (10-15 mg total) by mouth every 4 (four) hours as needed for severe pain (pain score 7-10).   oxyCODONE 5 MG immediate release tablet Commonly known as: Oxy IR/ROXICODONE Take 1-2 tablets (5-10 mg total) by mouth every 4 (four) hours as needed for moderate pain (pain score 4-6).   traMADol 50 MG tablet Commonly known as: ULTRAM Take 1 tablet (50 mg total) by mouth every 6 (six) hours.   verapamil 240 MG CR tablet Commonly known as: CALAN-SR Take 1 tablet Daily with Food for BP & Heart  Rhythm What changed:   how much to take  how to take this  when to take this  additional instructions               Durable Medical Equipment  (From admission, onward)           Start     Ordered   08/27/19 1706  DME Walker rolling  Once       Question:  Patient needs a walker to treat with the following condition  Answer:  History of open  reduction and internal fixation (ORIF) procedure   08/27/19 1705   08/27/19 1706  DME 3 n 1  Once        08/27/19 1705   08/27/19 1706  DME Bedside commode  Once       Question:  Patient needs a bedside commode to treat with the following condition  Answer:  History of open reduction and internal fixation (ORIF) procedure   08/27/19 1705              Discharge Care Instructions  (From admission, onward)           Start     Ordered   09/03/19 0000  Change dressing on IV access line weekly and PRN  (Home infusion instructions - [facility] )        09/03/19 1558   09/03/19 0000  Discharge wound care:       Comments: Follow-up wound care   09/03/19 1601            Follow-up Information     Persons, [name], [place] In 1 week.   Specialty: Orthopedic Surgery Contact information: [place] [address] [phone]         Newt Minion, MD Follow up in 2 week(s).   Specialty: Orthopedic Surgery Contact information: [address] [place] [address] [phone]                Allergies  Allergen Reactions  . Invokamet [Canagliflozin-Metformin Hcl] Other (See Comments)    Extremity edema/caused pain  . Invokana [Canagliflozin] Other (See Comments)    Extremity edema/caused pain  . Codeine Camsylate [Codeine] Rash  . Morphine And Related Rash    Consultations: Orthopedics  Procedures/Studies: . DG Knee 1-2 Views Right  Result Date: 08/30/2019 CLINICAL DATA:  Retained orthopedic hardware EXAM: RIGHT KNEE - 1-2 VIEW COMPARISON:  None. FINDINGS: No acute fracture or dislocation. No aggressive osseous lesion. Mild medial femorotibial compartment joint space narrowing. Right tibial  intramedullary nail. Mild soft tissue edema in the subcutaneous fat. IMPRESSION: Right tibial intramedullary nail partially visualized. Electronically Signed   By: Kathreen Devoid   On: 08/30/2019 10:59   MR Ankle Right w/o contrast  Result Date:  08/10/2019 CLINICAL DATA:  Right heel ulceration. Clinical concern for osteomyelitis EXAM: MRI OF THE RIGHT ANKLE WITHOUT CONTRAST TECHNIQUE: Multiplanar, multisequence MR imaging of the ankle was performed. No intravenous contrast was administered. COMPARISON:  X-ray 07/16/2019 FINDINGS: Superficial soft tissue ulceration at the posteromedial aspect of the hindfoot (series 3, images 37-39) with skin thickening and mild adjacent soft tissue edema. No underlying fluid collection. Ulceration does not extend to the underlying calcaneal cortex. TENDONS Peroneal: Intact peroneus longus and peroneus brevis tendons. Posteromedial: Intact tibialis posterior, flexor hallucis longus and flexor digitorum longus tendons. Anterior: Intact tibialis anterior, extensor hallucis longus and extensor digitorum longus tendons. Achilles: Intact. Plantar Fascia: Thickening and intermediate signal within the central and medial bands of the plantar fascia without tear or perifascial edema. LIGAMENTS Lateral: Evaluation of the lateral ankle ligaments is slightly degraded by susceptibility artifact and poor fat saturation related to patient's surgical hardware. The anterior and posterior tibiofibular ligaments are grossly intact. The anterior and posterior talofibular ligaments grossly are intact. Intact calcaneofibular ligament. Medial: Deltoid and spring ligament complex appear grossly intact. CARTILAGE Ankle Joint: Mild cartilage thinning with reactive subchondral marrow signal changes at the lateral talar shoulder. No tibiotalar joint effusion. Subtalar Joints/Sinus Tarsi: No joint effusion or chondral defect. Preservation of the anatomic fat within the sinus tarsi. Bones: Susceptibility artifact within the distal tibia related to prior ORIF hardware. Mild arthropathy and dorsal hypertrophy within the midfoot. Small plantar calcaneal spur and small enthesophyte at the Achilles tendon insertion. No acute fracture. No marrow edema. No  cortical destruction. Other: Mild intramuscular edema within the intrinsic foot musculature. No soft tissue fluid collection. IMPRESSION: 1. Superficial soft tissue ulceration at the posteromedial aspect of the right hindfoot. No underlying fluid collection or evidence of osteomyelitis. 2. Findings suggest chronic plantar fasciitis. 3. Mild intramuscular edema within the intrinsic foot musculature, which may reflect denervation changes versus myositis. 4. Mild arthropathy of the tibiotalar joint and midfoot. Electronically Signed   By: Davina Poke D.O.   On: 08/10/2019 10:26   MR ANKLE RIGHT W WO CONTRAST  Result Date: 08/27/2019 CLINICAL DATA:  History of osteomyelitis and abscess of the right foot requiring debridement. EXAM: MRI OF THE RIGHT ANKLE WITHOUT AND WITH CONTRAST TECHNIQUE: Multiplanar, multisequence MR imaging of the ankle was performed before and after the administration of intravenous contrast. CONTRAST:  67m GADAVIST GADOBUTROL 1 MMOL/ML IV SOLN COMPARISON:  08/08/2019 FINDINGS: Susceptibility artifact resulting from the orthopedic hardware in the distal tibia partially obscures the adjacent soft tissue and osseous structures especially at the level of the ankle joint. TENDONS Peroneal: Peroneal longus tendon intact. Peroneal brevis intact. Posteromedial: Posterior tibial tendon intact. Flexor hallucis longus tendon intact. Flexor digitorum longus tendon intact. Anterior: Tibialis anterior tendon intact. Extensor hallucis longus tendon intact Extensor digitorum longus tendon intact. Achilles: Interval resection of the calcaneal attachment of the Achilles tendon. Plantar Fascia: Interval resection of the calcaneal attachment of the plantar fascia. LIGAMENTS Lateral: Obscured by susceptibility artifact resulting from the distal tibial intramedullary nail. Medial: Obscured by susceptibility artifact resulting from the distal tibial intramedullary nail. CARTILAGE Ankle Joint: Susceptibility  artifact resulting from the orthopedic hardware in the distal tibia partially obscures the adjacent soft tissue and osseous structures especially at the level of the  ankle joint. Subtalar Joints/Sinus Tarsi: Normal subtalar joints. No subtalar joint effusion. Normal sinus tarsi. Bones/Soft Tissue: Soft tissue ulcer or wound along the posteromedial aspect of the hindfoot at the level of the posterior calcaneus. Interval debridement of the hindfoot soft tissues. Interval posterior calcaneal resection with release of the calcaneal attachment of the plantar fascia and Achilles tendon. No significant marrow signal abnormality. No periosteal reaction or bone destruction. Osteoarthritis of the talonavicular joint. Small amount of fluid along the flexor digitorum tendons at the level of the midfoot which may reflect a ganglion cyst or tenosynovial fluid. No surrounding inflammatory changes or enhancement to suggest an abscess. IMPRESSION: IMPRESSION Soft tissue ulcer or wound along the posteromedial aspect of the hindfoot at the level of the posterior calcaneus with interval debridement of the hindfoot soft tissues. Interval posterior calcaneal resection with release of the calcaneal attachment of the plantar fascia and Achilles tendon. No evidence of osteomyelitis or abscess. Electronically Signed   By: Kathreen Devoid   On: 08/27/2019 07:20   DG Chest Port 1 View  Result Date: 08/26/2019 CLINICAL DATA:  Possible sepsis. EXAM: PORTABLE CHEST 1 VIEW COMPARISON:  October 13, 2016. FINDINGS: The heart size and mediastinal contours are within normal limits. Both lungs are clear. The visualized skeletal structures are unremarkable. IMPRESSION: No active disease. Electronically Signed   By: Marijo Conception M.D.   On: 08/26/2019 12:13   DG Tibia/Fibula Right Port  Result Date: 08/28/2019 CLINICAL DATA:  RIGHT foot gangrenous infection. 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 EXAM: PORTABLE RIGHT TIBIA AND FIBULA - 2 VIEW  COMPARISON:  07/16/2019, 08/21/2016 FINDINGS: Intramedullary rod in the tibia with distal cortical screws, unremarkable in appearance. Chronic deformity of the distal RIGHT tibia and fibula consistent with remote injury. Interval amputation of the RIGHT foot at the tibiotalar joint. The cortical surfaces of the distal tibia and fibula are unremarkable. Postoperative skin clips remain. IMPRESSION: 1. Postoperative changes. 2. No evidence for acute abnormality. Electronically Signed   By: Nolon Nations M.D.   On: 08/28/2019 11:34   DG MINI C-ARM IMAGE ONLY  Result Date: 09/01/2019 There is no interpretation for this exam.  This order is for images obtained during a surgical procedure.  Please See "Surgeries" Tab for more information regarding the procedure.   VAS [place] LOWER EXTREMITY VENOUS (DVT)  Result Date: 08/26/2019  Lower Venous DVTStudy Indications: Swelling.  Risk Factors: Surgery. Limitations: Body habitus, poor ultrasound/tissue interface, patient pain tolerance, patient movement and open wound. Comparison Study: No prior studies. Performing Technologist: Oliver Hum RVT  Examination Guidelines: A complete evaluation includes B-mode imaging, spectral Doppler, color Doppler, and power Doppler as needed of all accessible portions of each vessel. Bilateral testing is considered an integral part of a complete examination. Limited examinations for reoccurring indications may be performed as noted. The reflux portion of the exam is performed with the patient in reverse Trendelenburg.  +---------+---------------+---------+-----------+----------+--------------+ RIGHT    CompressibilityPhasicitySpontaneityPropertiesThrombus Aging +---------+---------------+---------+-----------+----------+--------------+ CFV      Full           Yes      Yes                                 +---------+---------------+---------+-----------+----------+--------------+ SFJ      Full                                                         +---------+---------------+---------+-----------+----------+--------------+  FV Prox  Full                                                        +---------+---------------+---------+-----------+----------+--------------+ FV Mid   Full                                                        +---------+---------------+---------+-----------+----------+--------------+ FV DistalFull                                                        +---------+---------------+---------+-----------+----------+--------------+ PFV      Full                                                        +---------+---------------+---------+-----------+----------+--------------+ POP      Full           Yes      Yes                                 +---------+---------------+---------+-----------+----------+--------------+ PTV      Full                                                        +---------+---------------+---------+-----------+----------+--------------+ PERO                                                  Not visualized +---------+---------------+---------+-----------+----------+--------------+ Multiphasic flow is noted in the popliteal, posterior tbial, and anterior tibial arteries.  +----+---------------+---------+-----------+----------+--------------+ LEFTCompressibilityPhasicitySpontaneityPropertiesThrombus Aging +----+---------------+---------+-----------+----------+--------------+ CFV Full           Yes      Yes                                 +----+---------------+---------+-----------+----------+--------------+     Summary: RIGHT: - There is no evidence of deep vein thrombosis in the lower extremity. However, portions of this examination were limited- see technologist comments above.  - No cystic structure found in the popliteal fossa.  LEFT: - No evidence of common femoral vein obstruction.  *See table(s) above for measurements and  observations. Electronically signed by Curt Jews MD on 08/26/2019 at 4:22:37 PM.    Final      RIGHT BELOW KNEE AMPUTATION Removal of deep retained hardware IM nail. Application of Prevena wound VAC customizable and arthroform. Tissue and abscess sent for cultures   Subjective: Patient was seen and examined at bedside.  Patient underwent right below-knee amputation tolerated well.  Doing much better reports pain is better control has participated in physical therapy.  Anticipating discharge to rehab today.  Discharge Exam: Vitals:   09/03/19 0744 09/03/19 1508  BP: (!) 150/78 (!) 142/70  Pulse: 68 64  Resp: 17 15  Temp: (!) 97.5 F (36.4 C) 97.6 F (36.4 C)  SpO2: 97% 98%   Vitals:   09/02/19 1952 09/03/19 0356 09/03/19 0744 09/03/19 1508  BP: (!) 145/77 (!) 144/128 (!) 150/78 (!) 142/70  Pulse: 70 68 68 64  Resp: '18  17 15  ' Temp: (!) 97.5 F (36.4 C) 97.7 F (36.5 C) (!) 97.5 F (36.4 C) 97.6 F (36.4 C)  TempSrc: Oral Oral Oral Oral  SpO2: 99% 95% 97% 98%  Weight:      Height:        General: Pt is alert, awake, not in acute distress Cardiovascular: RRR, S1/S2 +, no rubs, no gallops Respiratory: CTA bilaterally, no wheezing, no rhonchi Abdominal: Soft, NT, ND, bowel sounds + Extremities: [name]    The results of significant diagnostics from this hospitalization (including imaging, microbiology, ancillary and laboratory) are listed below for reference.     Microbiology: Recent Results (from the past 240 hour(s))  Blood Culture (routine x 2)     Status: None   Collection Time: 08/26/19 12:37 PM   Specimen: BLOOD RIGHT ARM  Result Value Ref Range Status   Specimen Description BLOOD RIGHT ARM  Final   Special Requests   Final    BOTTLES DRAWN AEROBIC ONLY Blood Culture results may not be optimal due to an inadequate volume of blood received in culture bottles   Culture   Final    NO GROWTH 5 DAYS Performed at [facility], [place] [address],  [place], [place] [address]    Report Status 08/31/2019 FINAL  Final  Blood Culture (routine x 2)     Status: None   Collection Time: 08/26/19  1:00 PM   Specimen: BLOOD RIGHT FOREARM  Result Value Ref Range Status   Specimen Description BLOOD RIGHT FOREARM  Final   Special Requests   Final    BOTTLES DRAWN AEROBIC ONLY Blood Culture results may not be optimal due to an inadequate volume of blood received in culture bottles   Culture   Final    NO GROWTH 5 DAYS Performed at [facility], [place] [address] [place], [place] [address]    Report Status 08/31/2019 FINAL  Final  SARS Coronavirus 2 by RT PCR (hospital order, performed in [facility] hospital lab) Nasopharyngeal Nasopharyngeal Swab     Status: None   Collection Time: 08/26/19  1:13 PM   Specimen: Nasopharyngeal Swab  Result Value Ref Range Status   SARS Coronavirus 2 NEGATIVE NEGATIVE Final    Comment: (NOTE) SARS-CoV-2 target nucleic acids are NOT DETECTED.  The SARS-CoV-2 RNA is generally detectable in upper and lower respiratory specimens during the acute phase of infection. The lowest concentration of SARS-CoV-2 viral copies this assay can detect is 250 copies / mL. A negative result does not preclude SARS-CoV-2 infection and should not be used as the sole basis for treatment or other patient management decisions.  A negative result may occur with improper specimen collection / handling, submission of specimen other than nasopharyngeal swab, presence of viral mutation(s) within the areas targeted by this assay, and inadequate number of viral copies (<250 copies / mL). A negative result must be combined  with clinical observations, patient history, and epidemiological information.  Fact Sheet for Patients:   StrictlyIdeas.no  Fact Sheet for Healthcare Providers: BankingDealers.co.za  This test is not yet approved or  cleared by the [place] FDA and has been  authorized for detection and/or diagnosis of SARS-CoV-2 by FDA under an Emergency Use Authorization (EUA).  This EUA will remain in effect (meaning this test can be used) for the duration of the COVID-19 declaration under Section 564(b)(1) of the Act, 21 U.S.C. section 360bbb-3(b)(1), unless the authorization is terminated or revoked sooner.  Performed at [facility], [place] [address], [place], [place] [address]   Surgical pcr screen     Status: None   Collection Time: 08/27/19 10:01 AM   Specimen: Nasal Mucosa; Nasal Swab  Result Value Ref Range Status   MRSA, PCR NEGATIVE NEGATIVE Final   Staphylococcus aureus NEGATIVE NEGATIVE Final    Comment: (NOTE) The Xpert SA Assay (FDA approved for NASAL specimens in patients 87 years of age and older), is one component of a comprehensive surveillance program. It is not intended to diagnose infection nor to guide or monitor treatment. Performed at [facility], [place] [address], [place], [place] [address]   Aerobic/Anaerobic Culture (surgical/deep wound)     Status: None (Preliminary result)   Collection Time: 09/01/19  5:18 PM   Specimen: PATH Other; Tissue  Result Value Ref Range Status   Specimen Description TISSUE RIGHT LOWER LEG  Final   Special Requests NONE  Final   Gram Stain   Final    MODERATE WBC PRESENT, PREDOMINANTLY PMN NO ORGANISMS SEEN    Culture   Final    NO GROWTH 2 DAYS NO ANAEROBES ISOLATED; CULTURE IN PROGRESS FOR 5 DAYS Performed at [facility], [place] [address] [place], [place] [address]    Report Status PENDING  Incomplete     Labs: BNP (last 3 results) No results for input(s): BNP in the last 8760 hours. Basic Metabolic Panel: Recent Labs  Lab 08/28/19 0935 08/30/19 0238 08/31/19 0325 09/01/19 1046 09/03/19 0554  NA 132* 133* 131* 135 134*  K 4.6 4.1 4.0 4.2 4.3  CL 103 103 100 101 101  CO2 20* 20* 20* 26 26  GLUCOSE 97 109* 129* 110* 124*  BUN 23* '14 10 8 11  ' CREATININE 0.83  0.68 0.57* 0.58* 0.66  CALCIUM 8.1* 8.1* 8.0* 8.2* 7.8*  MG  --   --   --   --  2.0  PHOS  --   --   --   --  3.0   Liver Function Tests: No results for input(s): AST, ALT, ALKPHOS, BILITOT, PROT, ALBUMIN in the last 168 hours. No results for input(s): LIPASE, AMYLASE in the last 168 hours. No results for input(s): AMMONIA in the last 168 hours. CBC: Recent Labs  Lab 08/28/19 0935 08/28/19 0935 08/29/19 1235 08/30/19 0238 08/31/19 0325 09/01/19 1046 09/03/19 0554  WBC 16.7*   < > 13.3* 12.2* 10.8* 6.7 9.4  NEUTROABS 13.6*  --  9.9* 8.5*  --   --   --   HGB 11.9*   < > 11.6* 11.0* 11.3* 11.8* 11.6*  HCT 36.0*   < > 35.7* 33.7* 35.0* 36.2* 35.2*  MCV 94.7   < > 95.2 94.4 95.1 94.8 95.4  PLT 185   < > 235 289 285 292 356   < > = values in this interval not displayed.   Cardiac Enzymes: No results  for input(s): CKTOTAL, CKMB, CKMBINDEX, TROPONINI in the last 168 hours. BNP: Invalid input(s): POCBNP CBG: Recent Labs  Lab 09/02/19 1646 09/02/19 2357 09/03/19 0353 09/03/19 0741 09/03/19 1140  GLUCAP 148* 173* 140* 122* 155*   D-Dimer No results for input(s): DDIMER in the last 72 hours. Hgb A1c No results for input(s): HGBA1C in the last 72 hours. Lipid Profile No results for input(s): CHOL, HDL, LDLCALC, TRIG, CHOLHDL, LDLDIRECT in the last 72 hours. Thyroid function studies No results for input(s): TSH, T4TOTAL, T3FREE, THYROIDAB in the last 72 hours.  Invalid input(s): FREET3 Anemia work up No results for input(s): VITAMINB12, FOLATE, FERRITIN, TIBC, IRON, RETICCTPCT in the last 72 hours. Urinalysis    Component Value Date/Time   COLORURINE YELLOW 07/19/2019 1510   APPEARANCEUR CLEAR 07/19/2019 1510   LABSPEC 1.032 07/19/2019 1510   PHURINE < OR = 5.0 07/19/2019 1510   GLUCOSEU 3+ (A) 07/19/2019 1510   HGBUR NEGATIVE 07/19/2019 1510   BILIRUBINUR NEGATIVE 12/04/2015 1042   KETONESUR NEGATIVE 07/19/2019 1510   PROTEINUR NEGATIVE 07/19/2019 1510    UROBILINOGEN 1.0 11/01/2010 2007   NITRITE NEGATIVE 07/19/2019 1510   LEUKOCYTESUR NEGATIVE 07/19/2019 1510   Sepsis Labs Invalid input(s): PROCALCITONIN,  WBC,  LACTICIDVEN Microbiology Recent Results (from the past 240 hour(s))  Blood Culture (routine x 2)     Status: None   Collection Time: 08/26/19 12:37 PM   Specimen: BLOOD RIGHT ARM  Result Value Ref Range Status   Specimen Description BLOOD RIGHT ARM  Final   Special Requests   Final    BOTTLES DRAWN AEROBIC ONLY Blood Culture results may not be optimal due to an inadequate volume of blood received in culture bottles   Culture   Final    NO GROWTH 5 DAYS Performed at [facility], [place] [address] [place], [place] [address]    Report Status 08/31/2019 FINAL  Final  Blood Culture (routine x 2)     Status: None   Collection Time: 08/26/19  1:00 PM   Specimen: BLOOD RIGHT FOREARM  Result Value Ref Range Status   Specimen Description BLOOD RIGHT FOREARM  Final   Special Requests   Final    BOTTLES DRAWN AEROBIC ONLY Blood Culture results may not be optimal due to an inadequate volume of blood received in culture bottles   Culture   Final    NO GROWTH 5 DAYS Performed at [facility], [place] [address] [place], [place] [address]    Report Status 08/31/2019 FINAL  Final  SARS Coronavirus 2 by RT PCR (hospital order, performed in [facility] hospital lab) Nasopharyngeal Nasopharyngeal Swab     Status: None   Collection Time: 08/26/19  1:13 PM   Specimen: Nasopharyngeal Swab  Result Value Ref Range Status   SARS Coronavirus 2 NEGATIVE NEGATIVE Final    Comment: (NOTE) SARS-CoV-2 target nucleic acids are NOT DETECTED.  The SARS-CoV-2 RNA is generally detectable in upper and lower respiratory specimens during the acute phase of infection. The lowest concentration of SARS-CoV-2 viral copies this assay can detect is 250 copies / mL. A negative result does not preclude SARS-CoV-2 infection and should not be used as the  sole basis for treatment or other patient management decisions.  A negative result may occur with improper specimen collection / handling, submission of specimen other than nasopharyngeal swab, presence of viral mutation(s) within the areas targeted by this assay, and inadequate number of viral copies (<250 copies / mL). A negative  result must be combined with clinical observations, patient history, and epidemiological information.  Fact Sheet for Patients:   StrictlyIdeas.no  Fact Sheet for Healthcare Providers: BankingDealers.co.za  This test is not yet approved or  cleared by the [place] FDA and has been authorized for detection and/or diagnosis of SARS-CoV-2 by FDA under an Emergency Use Authorization (EUA).  This EUA will remain in effect (meaning this test can be used) for the duration of the COVID-19 declaration under Section 564(b)(1) of the Act, 21 U.S.C. section 360bbb-3(b)(1), unless the authorization is terminated or revoked sooner.  Performed at [facility], [place] [address] [place], [place] [address]   Surgical pcr screen     Status: None   Collection Time: 08/27/19 10:01 AM   Specimen: Nasal Mucosa; Nasal Swab  Result Value Ref Range Status   MRSA, PCR NEGATIVE NEGATIVE Final   Staphylococcus aureus NEGATIVE NEGATIVE Final    Comment: (NOTE) The Xpert SA Assay (FDA approved for NASAL specimens in patients 63 years of age and older), is one component of a comprehensive surveillance program. It is not intended to diagnose infection nor to guide or monitor treatment. Performed at [facility], [place] [address] [place], [place] [address]   Aerobic/Anaerobic Culture (surgical/deep wound)     Status: None (Preliminary result)   Collection Time: 09/01/19  5:18 PM   Specimen: PATH Other; Tissue  Result Value Ref Range Status   Specimen Description TISSUE RIGHT LOWER LEG  Final   Special Requests NONE   Final   Gram Stain   Final    MODERATE WBC PRESENT, PREDOMINANTLY PMN NO ORGANISMS SEEN    Culture   Final    NO GROWTH 2 DAYS NO ANAEROBES ISOLATED; CULTURE IN PROGRESS FOR 5 DAYS Performed at [facility], [place] [address] [place], [place] [address]    Report Status PENDING  Incomplete     Time coordinating discharge: Over 30 minutes  SIGNED:   Shawna Clamp, MD  [facility] 09/03/2019, 4:03 PM Pager   If 7PM-7AM, please contact night-coverage [email or web address]

## 2019-09-03 NOTE — Plan of Care (Signed)
  Problem: Health Behavior/Discharge Planning: Goal: Ability to manage health-related needs will improve Outcome: Progressing   Problem: Clinical Measurements: Goal: Ability to maintain clinical measurements within normal limits will improve Outcome: Progressing Goal: Will remain free from infection Outcome: Progressing   

## 2019-09-03 NOTE — Progress Notes (Signed)
Michael Staggers, MD  Physician  Physical Medicine and Rehabilitation  PMR Pre-admission      Signed  Date of Service:  09/02/2019  3:59 PM      Related encounter: ED to Hosp-Admission (Current) from 08/26/2019 in [place]       Show:Clear all _0 Manual_1 Template_2 Copied  Added by: _3 Michael Gong, RN_4 Michael Staggers, MD  _5 Hover for details PMR Admission Coordinator Pre-Admission Assessment   Patient: [name] is an 59 y.o., male MRN: [id number] DOB: 1960/02/16 Height: _6  (190.5 cm) Weight: 119.9 kg   Insurance Information HMO:     PPO: yes     PCP:      IPA:      80/20:      OTHER:  PRIMARY: [employer]      Policy#: [id number]      Subscriber: pt CM Name: DTilda Arellano     Phone#: [phone]    Fax#: [phone]-Cert#: [id number] Cert for 5 days     Employer: [employer] Benefits:  Phone #: [phone]    Name: 8/12 Eff. Date: 06/22/2019     Deduct: $3000      Out of Pocket Max: $[phone]     Life Max: none CIR: 70%      SNF: 70% 60 days Outpatient: 70%     Co-Pay: 30 visits each PT, OT, and SLP Home Health: 70%      Co-Pay: visits limited per medical neccesity DME: 50%     Co-Pay: 0% Providers: in network  SECONDARY: none      Policy#:      Phone#:    FDevelopment worker, community       Phone#:    The "Engineer, petroleum for patients in Inpatient Rehabilitation Facilities with attached "Privacy Act SGarfieldRecords" was provided and verbally reviewed with: N/A   Emergency Contact Information         Contact Information     Name Relation Home Work MPretty PrairieSpouse [phone]  [phone]        Current Medical History  Patient Admitting Diagnosis: BKA  History of Present Illness:  RFarouk Arellano MSofferis a 59year old right-handed male with history of diabetes mellitus, hyperlipidemia, morbid obesity with BMI 33.04, hypertension,  hepatitis C and tobacco abuse.  .  Patient reportedly rather sedentary due to right heel ulcer.  Presented 08/26/2019 with gangrenous changes right calcaneus and recently underwent incision and drainage 08/20/2019 by Dr. DSharol Givenon an outpatient basis.  Presented with increasing pain and swelling of right heel as well as low-grade fever.  MRI of the right ankle and foot showed soft tissue ulcer posterior medial aspect of the hindfoot at the level of the posterior calcaneus no evidence of osteomyelitis or abscess.  Admission chemistry sodium 131, glucose 129, creatinine 0.57, WBC 10,800, hemoglobin 11.3.  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 DSharol Arellano  Wound VAC applied as directed.  Initially maintained on Zosyn changed to Rocephin and Flagyl initial cultures growing gram-positive cocci gram-negative rods blood cultures negative.  Postoperative hemoglobin 11.6.  Placed on Lovenox for DVT prophylaxis.      [facility] by the rehabilitation admission coordinator and physician.   Past Medical History      Past Medical History:  Diagnosis Date  . Diabetic  neuropathy ([place])    . Diverticulitis    . History of hepatitis C      has been treated in the past  . History of kidney stones    . Hyperlipidemia    . Hypertension    . Other testicular hypofunction    . Type II or unspecified type diabetes mellitus without mention of complication, not stated as uncontrolled    . Vitamin Michael deficiency        Family History   family history includes Alzheimer's disease in his father; Cancer in his father; Hypertension in his mother; Stroke in his father.   Prior Rehab/Hospitalizations Has the patient had prior rehab or hospitalizations prior to admission? Yes   Has the patient had major surgery during 100 days prior to admission? Yes              Current Medications   Current Facility-Administered  Medications:  .  0.9 %  sodium chloride infusion, , Intravenous, Continuous, Persons, [name], [place], Last Rate: 125 mL/hr at 08/26/19 1750, New Bag at 08/26/19 1750 .  0.9 %  sodium chloride infusion, , Intravenous, Continuous, Persons, [name], [place], Last Rate: 125 mL/hr at 08/28/19 1732, New Bag at 08/28/19 1732 .  0.9 %  sodium chloride infusion, , Intravenous, Continuous, Persons, [name], [place] .  acetaminophen (TYLENOL) tablet 325-650 mg, 325-650 mg, Oral, Q6H PRN, Persons, [name], [place], 650 mg at 09/02/19 2123 .  aspirin EC tablet 81 mg, 81 mg, Oral, Daily, Persons, Michael Palmer, PA, 81 mg at 09/03/19 1027 .  cefTRIAXone (ROCEPHIN) 2 g in sodium chloride 0.9 % 100 mL IVPB, 2 g, Intravenous, Q24H, Persons, [name], [place], Last Rate: 200 mL/hr at 09/02/19 2127, 2 g at 09/02/19 2127 .  citalopram (CELEXA) tablet 20 mg, 20 mg, Oral, Daily, Persons, Michael Palmer, PA, 20 mg at 09/03/19 1028 .  diphenhydrAMINE (BENADRYL) 12.5 MG/5ML elixir 25 mg, 25 mg, Oral, Q4H PRN, Persons, Michael Palmer, PA .  docusate sodium (COLACE) capsule 100 mg, 100 mg, Oral, BID, Persons, [name], [place], 100 mg at 09/02/19 2123 .  enoxaparin (LOVENOX) injection 60 mg, 60 mg, Subcutaneous, Q24H, Persons, Michael Palmer, PA, 60 mg at 09/02/19 1707 .  HYDROmorphone (DILAUDID) injection 0.5-1 mg, 0.5-1 mg, Intravenous, Q4H PRN, Persons, Michael Palmer, PA, 1 mg at 09/03/19 0157 .  insulin aspart (novoLOG) injection 0-15 Units, 0-15 Units, Subcutaneous, TID WC, Persons, [name], [place], 3 Units at 09/03/19 1225 .  insulin aspart (novoLOG) injection 0-5 Units, 0-5 Units, Subcutaneous, QHS, Persons, Michael Palmer, PA .  lactated ringers infusion, , Intravenous, Continuous, Persons, [name], [place], Last Rate: 500 mL/hr at 09/01/19 1733, New Bag at 09/01/19 1733 .  magnesium citrate solution 1 Bottle, 1 Bottle, Oral, Once PRN, Persons, Michael Palmer, PA .  methocarbamol (ROBAXIN) tablet 500 mg, 500 mg, Oral, Q6H PRN, 500 mg at 09/03/19 0624 **OR** methocarbamol  (ROBAXIN) 500 mg in dextrose 5 % 50 mL IVPB, 500 mg, Intravenous, Q6H PRN, Persons, Michael Palmer, PA .  metoCLOPramide (REGLAN) tablet 5-10 mg, 5-10 mg, Oral, Q8H PRN **OR** metoCLOPramide (REGLAN) injection 5-10 mg, 5-10 mg, Intravenous, Q8H PRN, Persons, Michael Palmer, PA .  metroNIDAZOLE (FLAGYL) IVPB 500 mg, 500 mg, Intravenous, Q8H, Persons, Michael Palmer, PA, Last Rate: 100 mL/hr at 09/03/19 1329, 500 mg at 09/03/19 1329 .  ondansetron (ZOFRAN) tablet 4 mg, 4 mg, Oral, Q6H PRN **OR** ondansetron (ZOFRAN) injection 4 mg, 4 mg, Intravenous, Q6H PRN, Persons, Michael Palmer,  PA .  oxyCODONE (Oxy IR/ROXICODONE) immediate release tablet 10-15 mg, 10-15 mg, Oral, Q4H PRN, Persons, Michael Palmer, PA .  oxyCODONE (Oxy IR/ROXICODONE) immediate release tablet 5-10 mg, 5-10 mg, Oral, Q4H PRN, Persons, Michael Palmer, PA, 10 mg at 09/03/19 1028 .  polyethylene glycol (MIRALAX / GLYCOLAX) packet 17 g, 17 g, Oral, Daily PRN, Persons, Michael Palmer, PA .  sodium chloride flush (NS) 0.9 % injection 3 mL, 3 mL, Intravenous, Once, Persons, Michael Palmer, PA .  sodium chloride flush (NS) 0.9 % injection 3 mL, 3 mL, Intravenous, Q12H, Persons, Michael Palmer, PA, 3 mL at 09/03/19 1027 .  sorbitol 70 % solution 30 mL, 30 mL, Oral, Daily PRN, Persons, Michael Palmer, PA .  traMADol Veatrice Bourbon) tablet 50 mg, 50 mg, Oral, Q6H, Persons, [name], [place], 50 mg at 09/03/19 1247 .  verapamil (CALAN-SR) CR tablet 240 mg, 240 mg, Oral, Q breakfast, Persons, [name], [place], 240 mg at 09/03/19 0900   Patients Current Diet:     Diet Order                      Diet Carb Modified Fluid consistency: Thin; Room service appropriate? Yes  Diet effective now                      Precautions / Restrictions Precautions Precautions: Fall Restrictions Weight Bearing Restrictions: Yes RLE Weight Bearing: Non weight bearing    Has the patient had 2 or more falls or a fall with injury in the past year? No   Prior Activity Level Limited Community (1-2x/wk): Mod I with  cane over past couple months; driving; working in Press [employer]   Prior Functional Level Self Care: Did the patient need help bathing, dressing, using the toilet or eating? Independent   Indoor Mobility: Did the patient need assistance with walking from room to room (with or without device)? Independent   Stairs: Did the patient need assistance with internal or external stairs (with or without device)? Independent   Functional Cognition: Did the patient need help planning regular tasks such as shopping or remembering to take medications? Independent   Home Assistive Devices / Equipment Home Assistive Devices/Equipment: CBG Meter, Eyeglasses Home Equipment: Grab bars - tub/shower, Hand held shower head   Prior Device Use: Indicate devices/aids used by the patient prior to current illness, exacerbation or injury? cane   Current Functional Level Cognition   Overall Cognitive Status: Within Functional Limits for tasks assessed Orientation Level: Oriented X4    Extremity Assessment (includes Sensation/Coordination)   Upper Extremity Assessment: Defer to OT evaluation LUE Deficits / Details: Pt does demonstrate decreased strength in UE (R worse than L) - unable to complete tricep push down in chair LUE Sensation: WNL LUE Coordination: WNL  Lower Extremity Assessment: LLE deficits/detail, RLE deficits/detail RLE Deficits / Details: R BKA.  R knee ROM 0 to 75 degrees (feels tape from wound vac is limiting flex) and MMT at least 3/5.  R hip ROM WFL and MMT at least 3/5 RLE: Unable to fully assess due to pain, Unable to fully assess due to immobilization LLE Deficits / Details: ROM WFL; MMT 5/5     ADLs   Overall ADL's : Needs assistance/impaired Eating/Feeding: Modified independent, Sitting Grooming: Set up, Sitting Upper Body Bathing: Min guard, Sitting (sitting at EOB ) Lower Body Bathing: Minimal assistance, Sitting/lateral leans Upper Body Dressing : Min guard, Sitting Lower Body Dressing:  Minimal assistance, Sitting/lateral leans Lower  Body Dressing Details (indicate cue type and reason): assist to don sock sitting EOB Toilet Transfer: Stand-pivot, RW, Minimal assistance Toilet Transfer Details (indicate cue type and reason): simulated from elevated EOB to recliner Toileting- Clothing Manipulation and Hygiene: Minimal assistance, Sit to/from stand Toileting - Clothing Manipulation Details (indicate cue type and reason): from EOB elevated Functional mobility during ADLs: Rolling walker, Minimal assistance General ADL Comments: sat EOB with good control;stand-pivot transfer to recliner pt pivoting foot on floor;educated pt on activity progression, fall prevention,      Mobility   Overal bed mobility: Needs Assistance Bed Mobility: Supine to Sit Supine to sit: Supervision, HOB elevated Sit to supine: Min guard, HOB elevated General bed mobility comments: supervision for safety     Transfers   Overall transfer level: Needs assistance Equipment used: Rolling walker (2 wheeled) Transfers: Sit to/from Stand, [employer] Transfers Sit to Stand: Mod assist Stand pivot transfers: Min assist General transfer comment: Standing from chair (non elevated) required mod A and 2 attempts.  Pt recalled safe hand placement cues from OT.  Min A for steadying for pivot once upright.  Cued on pivots to R side for increase ease at this time.     Ambulation / Gait / Stairs / Wheelchair Mobility   Ambulation/Gait Ambulation/Gait assistance: Mod assist Gait Distance (Feet): 5 Feet Assistive device: Rolling walker (2 wheeled) Gait Pattern/deviations: Step-to pattern, Trunk flexed General Gait Details: deferred Gait velocity: decreased      Posture / Balance Balance Overall balance assessment: Needs assistance Sitting-balance support: No upper extremity supported Sitting balance-Leahy Scale: Good Standing balance support: Bilateral upper extremity supported Standing balance-Leahy Scale:  Poor Standing balance comment: Reliance on RW and min A during pivot     Special needs/care consideration Wound VAC Hgb A1c 6.6 visitor is wife, [name]    Previous Home Environment  Living Arrangements: Spouse/significant other  Lives With: Spouse Available Help at Discharge: Family, Available 24 hours/day Type of Home: House Home Layout: One level Home Access: Stairs to enter Entrance Stairs-Rails: None Technical brewer of Steps: 1 Bathroom Shower/Tub: Public librarian, Architectural technologist: Programmer, systems: Yes Home Care Services: No Additional Comments: grab bar in shower and hand held shower    Discharge Living Setting Plans for Discharge Living Setting: Patient's home, Lives with (comment) (spouse) Type of Home at Discharge: House Discharge Home Layout: One level Discharge Home Access: Stairs to enter Entrance Stairs-Rails: None Entrance Stairs-Number of Steps: 1 Discharge Bathroom Shower/Tub: Tub/shower unit, Curtain Discharge Bathroom Toilet: Standard Discharge Bathroom Accessibility: Yes How Accessible: Accessible via walker Does the patient have any problems obtaining your medications?: No   Social/Family/Support Systems Patient Roles: Spouse (employee) Contact Information: wife, [name] Anticipated Caregiver: wife Anticipated Ambulance person Information: see above Ability/Limitations of Caregiver: none Caregiver Availability: 24/7 Discharge Plan Discussed with Primary Caregiver: Yes Is Caregiver In Agreement with Plan?: Yes Does Caregiver/Family have Issues with Lodging/Transportation while Pt is in Rehab?: No   Goals Patient/Family Goal for Rehab: Mod I to supervision wiht PT and OT Expected length of stay: ELOS 7 to 10 days Pt/Family Agrees to Admission and willing to participate: Yes Program Orientation Provided & Reviewed with Pt/Caregiver Including Roles  & Responsibilities: Yes    Decrease burden of Care through IP rehab  admission:n/a   Possible need for SNF placement upon discharge: not anticipated   Patient Condition: I have reviewed medical records from [facility] , spoken with CM, and patient and spouse. I met with patient at  the bedside for inpatient rehabilitation assessment.  Patient will benefit from ongoing PT and OT, can actively participate in 3 hours of therapy a day 5 days of the week, and can make measurable gains during the admission.  Patient will also benefit from the coordinated team approach during an Inpatient Acute Rehabilitation admission.  The patient will receive intensive therapy as well as Rehabilitation physician, nursing, social worker, and care management interventions.  Due to bladder management, bowel management, safety, skin/wound care, disease management, medication administration, pain management and patient education the patient requires 24 hour a day rehabilitation nursing.  The patient is currently mod assist with mobility and basic ADLs.  Discharge setting and therapy post discharge at home with home health is anticipated.  Patient has agreed to participate in the Acute Inpatient Rehabilitation Program and will admit today.   Preadmission Screen Completed By:  Cleatrice Burke, 09/03/2019 3:41 PM ______________________________________________________________________   Discussed status with Dr. Naaman Plummer on 09/03/2019 at 1540 and received approval for admission today.   Admission Coordinator:  Cleatrice Burke, RN, time 4132 Date  09/03/2019    Assessment/Plan: Diagnosis:right bka 1. Does the need for close, 24 hr/day Medical supervision in concert with the patient's rehab needs make it unreasonable for this patient to be served in a less intensive setting? Yes 2. Co-Morbidities requiring supervision/potential complications: obesity, htn, dm 3. Due to bladder management, bowel management, safety, skin/wound care, disease management, medication administration,  pain management and patient education, does the patient require 24 hr/day rehab nursing? Yes 4. Does the patient require coordinated care of a physician, rehab nurse, PT, OT, and SLP to address physical and functional deficits in the context of the above medical diagnosis(es)? Yes Addressing deficits in the following areas: balance, endurance, locomotion, strength, transferring, bowel/bladder control, bathing, dressing, feeding, grooming, toileting and psychosocial support 5. Can the patient actively participate in an intensive therapy program of at least 3 hrs of therapy 5 days a week? Yes 6. The potential for patient to make measurable gains while on inpatient rehab is excellent 7. Anticipated functional outcomes upon discharge from inpatient rehab: modified independent and supervision PT, modified independent and supervision OT, n/a SLP 8. Estimated rehab length of stay to reach the above functional goals is: 7-10 days 9. Anticipated discharge destination: Home 10. Overall Rehab/Functional Prognosis: excellent     MD Signature: Michael Staggers, MD, [place] 09/03/2019         Revision History                          Note Details  Author Michael Staggers, MD File Time 09/03/2019  3:49 PM  Author Type Physician Status Signed  Last Editor Michael Staggers, MD Service Physical Medicine and Rehabilitation

## 2019-09-03 NOTE — Progress Notes (Signed)
PROGRESS NOTE    [name]  JIZ:[id number] DOB: 03/31/1960 DOA: 08/26/2019 PCP: Michael Pinto, MD   Brief Narrative:  Michael Grinder Masonis a 59 y.o.malewith medical history significant ofhypertension, type 2 diabetes mellitus,  multiple other comorbidities including diagnosis of osteomyelitis of right calcaneus and abscess of the right calcaneus is status post incision and drainage done on 08/20/2019 by [name] on outpatient basis and discharged home the same day presented back to ED with the complaint of worsening pain and swelling of the right heel. According to patient, he was discharged home with no antibiotics however wound VAC was attached. Wound VAC was dislodged on Sunday night by accident. He followed up with [name] office on Monday and was seen by [name] and was told that his wound looked better. Subsequently, yesterday he received a call from Dr. [name] [name] that his culture from the abscess is growing bacteria so he was prescribed Omnicef which he took last night and this morning however he started having worsening redness, pain and swelling of the right foot along with chills fever and sweating yesterday which persisted today so he came to the emergency department. T-max was 102. Other than that he does not have any other complaint.In RJ:[id number] had some tachypnea as well as tachycardia. Has significant leukocytosis with white cells of 26.6as compared to normal last week. He was diagnosed with severe sepsis based on tachycardia, tachypnea, leukocytosis and lactic acid of 2.2. He received some antibiotics in the ED. Orthopedic on call/[name] consulted and admission was deferred to hospital service.Covid swab negative - patient is unvaccinated. Patient is admitted for severe sepsis secondary to right calcaneal cellulitis versus osteomyelitis.  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  PT and OT recommended inpatient rehab.  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  -MRI showed soft tissue ulcer without evidence of osteomyelitis or abscess -Orthopedic surgery following in consult. -POD 2 SYME amp - c/w abx and wound vac,   -Underwent rt. BKA by Dr Sharol Arellano on wed, 8/11. PT recommended CIR. Awaiting insurance authorization. - Pt lives with wife-has good social support -Continue IV fluids,changed abx from zosyn vanc to ctx and flagyl. - Continue to follow cultures, initial culture from abscess growing gram-positive cocci and gram-negative rods;blood culturesneg.  Essential hypertension:controlled. -Home medications includelisinopril, hydrochlorothiazide and verapamil. -Continue verapamil only, will restart other medications as BP allows.  Hyponatremia, chronic:Resolved  -Baseline around 133, currently within normal limits. -Continue to hold HCTZ.  Type 2 diabetes mellitus with polyneuropathy, borderline control: Hold home medications, continue sliding scale insulin, hypoglycemic protocol. Discussed need for medication and dietary compliance at length at bedside  DVT prophylaxis: Lovenox Code Status: Full Family Communication: Wife was at bedside, discussed with patient in detail Disposition Plan:  Dispo: The patient is from: Home  Anticipated d/c is to: CIR  Anticipated d/c date is: > 1-2 days  Patient  currently is not medically stable to d/c awaiting insurance authorization    Consultants:   Orthopeadics.  Procedures:  Antimicrobials:  Anti-infectives (From admission, onward)   Start     Dose/Rate Route Frequency Ordered Stop   09/01/19 0600  ceFAZolin (ANCEF) 3 g in dextrose 5 % 50 mL IVPB        3 g 100 mL/hr over 30 Minutes Intravenous On call to O.R. 08/31/19 0941 09/01/19 1722   08/28/19 2200  cefTRIAXone (ROCEPHIN) 2 g in sodium chloride 0.9 % 100 mL IVPB     Discontinue     2 g 200 mL/hr over 30 Minutes Intravenous Every 24 hours 08/28/19 1340     08/28/19 2200  metroNIDAZOLE (FLAGYL) IVPB 500 mg     Discontinue     500 mg 100 mL/hr over 60 Minutes Intravenous Every 8 hours 08/28/19 1340     08/27/19 0200  vancomycin (VANCOREADY) IVPB 750 mg/150 mL  Status:  Discontinued        750 mg 150 mL/hr over 60 Minutes Intravenous Every 12 hours 08/26/19 1231 08/28/19 1340   08/26/19 2200  piperacillin-tazobactam (ZOSYN) IVPB 3.375 g  Status:  Discontinued        3.375 g 12.5 mL/hr over 240 Minutes Intravenous Every 8 hours 08/26/19 1412 08/28/19 1340   08/26/19 1345  vancomycin (VANCOCIN) IVPB 1000 mg/200 mL premix  Status:  Discontinued        1,000 mg 200 mL/hr over 60 Minutes Intravenous  Once 08/26/19 1331 08/26/19 1352   08/26/19 1345  piperacillin-tazobactam (ZOSYN) IVPB 3.375 g        3.375 g 100 mL/hr over 30 Minutes Intravenous  Once 08/26/19 1331 08/26/19 1701   08/26/19 1345  vancomycin (VANCOCIN) IVPB 1000 mg/200 mL premix  Status:  Discontinued        1,000 mg 200 mL/hr over 60 Minutes Intravenous  Once 08/26/19 1331 08/26/19 1352   08/26/19 1145  vancomycin (VANCOCIN) IVPB 1000 mg/200 mL premix  Status:  Discontinued        1,000 mg 200 mL/hr over 60 Minutes Intravenous  Once 08/26/19 1138 08/26/19 1139   08/26/19 1145  cefTRIAXone (ROCEPHIN) 2 g in sodium chloride 0.9 % 100 mL IVPB        2 g 200 mL/hr over 30 Minutes Intravenous  Once 08/26/19 1138 08/26/19  1342   08/26/19 1145  vancomycin (VANCOREADY) IVPB 2000 mg/400 mL        2,000 mg 200 mL/hr over 120 Minutes Intravenous  Once 08/26/19 1139 08/26/19 1629     Subjective: Patient was seen and examined at bedside.  Patient is status post [name].  Sitting comfortably in the chair,  reports pain is better controlled with pain medications,  anticipating discharge to rehab. Wife was at bedside.  All the questions answered.  Objective: Vitals:   09/02/19 1952 09/03/19 0356 09/03/19 0744 09/03/19 1508  BP: (!) 145/77 (!) 144/128 (!) 150/78 (!) 142/70  Pulse: 70 68 68 64  Resp: '18  17 15  ' Temp: (!) 97.5 F (36.4 C) 97.7 F (36.5 C) (!) 97.5 F (36.4 C) 97.6 F (36.4 C)  TempSrc: Oral Oral Oral Oral  SpO2: 99% 95% 97% 98%  Weight:      Height:        Intake/Output Summary (Last 24 hours) at 09/03/2019 1516 Last data filed at 09/03/2019 1400 Gross per 24 hour  Intake 240 ml  Output 1200 ml  Net -960 ml   Filed Weights   08/26/19 0942 08/26/19 1527 08/27/19 1350  Weight: 117.9 kg 119.9 kg 119.9 kg  Examination:  General exam: Appears calm and comfortable  Respiratory system: Clear to auscultation. Respiratory effort normal. Cardiovascular system: S1 & S2 heard, RRR. No JVD, murmurs, rubs, gallops or clicks. No pedal edema. Gastrointestinal system: Abdomen is nondistended, soft and nontender. No organomegaly or masses felt. Normal bowel sounds heard. Central nervous system: Alert and oriented. No focal neurological deficits. Extremities: Right Above knee amputation with a wound VAC. Skin: No rashes, lesions or ulcers Psychiatry: Judgement and insight appear normal. Mood & affect appropriate.     Data Reviewed: I have personally reviewed following labs and imaging studies  CBC: Recent Labs  Lab 08/28/19 0935 08/28/19 0935 08/29/19 1235 08/30/19 0238 08/31/19 0325 09/01/19 1046 09/03/19 0554  WBC 16.7*   < > 13.3* 12.2* 10.8* 6.7 9.4  NEUTROABS 13.6*  --  9.9*  8.5*  --   --   --   HGB 11.9*   < > 11.6* 11.0* 11.3* 11.8* 11.6*  HCT 36.0*   < > 35.7* 33.7* 35.0* 36.2* 35.2*  MCV 94.7   < > 95.2 94.4 95.1 94.8 95.4  PLT 185   < > 235 289 285 292 356   < > = values in this interval not displayed.   Basic Metabolic Panel: Recent Labs  Lab 08/28/19 0935 08/30/19 0238 08/31/19 0325 09/01/19 1046 09/03/19 0554  NA 132* 133* 131* 135 134*  K 4.6 4.1 4.0 4.2 4.3  CL 103 103 100 101 101  CO2 20* 20* 20* 26 26  GLUCOSE 97 109* 129* 110* 124*  BUN 23* '14 10 8 11  ' CREATININE 0.83 0.68 0.57* 0.58* 0.66  CALCIUM 8.1* 8.1* 8.0* 8.2* 7.8*  MG  --   --   --   --  2.0  PHOS  --   --   --   --  3.0   GFR: Estimated Creatinine Clearance: 137.1 mL/min (by C-G formula based on SCr of 0.66 mg/dL). Liver Function Tests: No results for input(s): AST, ALT, ALKPHOS, BILITOT, PROT, ALBUMIN in the last 168 hours. No results for input(s): LIPASE, AMYLASE in the last 168 hours. No results for input(s): AMMONIA in the last 168 hours. Coagulation Profile: No results for input(s): INR, PROTIME in the last 168 hours. Cardiac Enzymes: No results for input(s): CKTOTAL, CKMB, CKMBINDEX, TROPONINI in the last 168 hours. BNP (last 3 results) No results for input(s): PROBNP in the last 8760 hours. HbA1C: No results for input(s): HGBA1C in the last 72 hours. CBG: Recent Labs  Lab 09/02/19 1646 09/02/19 2357 09/03/19 0353 09/03/19 0741 09/03/19 1140  GLUCAP 148* 173* 140* 122* 155*   Lipid Profile: No results for input(s): CHOL, HDL, LDLCALC, TRIG, CHOLHDL, LDLDIRECT in the last 72 hours. Thyroid Function Tests: No results for input(s): TSH, T4TOTAL, FREET4, T3FREE, THYROIDAB in the last 72 hours. Anemia Panel: No results for input(s): VITAMINB12, FOLATE, FERRITIN, TIBC, IRON, RETICCTPCT in the last 72 hours. Sepsis Labs: No results for input(s): PROCALCITON, LATICACIDVEN in the last 168 hours.  Recent Results (from the past 240 hour(s))  Blood Culture  (routine x 2)     Status: None   Collection Time: 08/26/19 12:37 PM   Specimen: BLOOD RIGHT ARM  Result Value Ref Range Status   Specimen Description BLOOD RIGHT ARM  Final   Special Requests   Final    BOTTLES DRAWN AEROBIC ONLY Blood Culture results may not be optimal due to an inadequate volume of blood received in culture bottles   Culture   Final  NO GROWTH 5 DAYS Performed at [facility], [place] [address] [place], [place] [address]    Report Status 08/31/2019 FINAL  Final  Blood Culture (routine x 2)     Status: None   Collection Time: 08/26/19  1:00 PM   Specimen: BLOOD RIGHT FOREARM  Result Value Ref Range Status   Specimen Description BLOOD RIGHT FOREARM  Final   Special Requests   Final    BOTTLES DRAWN AEROBIC ONLY Blood Culture results may not be optimal due to an inadequate volume of blood received in culture bottles   Culture   Final    NO GROWTH 5 DAYS Performed at [facility], [place] [address], [place], [place] [address]    Report Status 08/31/2019 FINAL  Final  SARS Coronavirus 2 by RT PCR (hospital order, performed in [facility] hospital lab) Nasopharyngeal Nasopharyngeal Swab     Status: None   Collection Time: 08/26/19  1:13 PM   Specimen: Nasopharyngeal Swab  Result Value Ref Range Status   SARS Coronavirus 2 NEGATIVE NEGATIVE Final    Comment: (NOTE) SARS-CoV-2 target nucleic acids are NOT DETECTED.  The SARS-CoV-2 RNA is generally detectable in upper and lower respiratory specimens during the acute phase of infection. The lowest concentration of SARS-CoV-2 viral copies this assay can detect is 250 copies / mL. A negative result does not preclude SARS-CoV-2 infection and should not be used as the sole basis for treatment or other patient management decisions.  A negative result may occur with improper specimen collection / handling, submission of specimen other than nasopharyngeal swab, presence of viral mutation(s) within the areas targeted  by this assay, and inadequate number of viral copies (<250 copies / mL). A negative result must be combined with clinical observations, patient history, and epidemiological information.  Fact Sheet for Patients:   StrictlyIdeas.no  Fact Sheet for Healthcare Providers: BankingDealers.co.za  This test is not yet approved or  cleared by the [place] FDA and has been authorized for detection and/or diagnosis of SARS-CoV-2 by FDA under an Emergency Use Authorization (EUA).  This EUA will remain in effect (meaning this test can be used) for the duration of the COVID-19 declaration under Section 564(b)(1) of the Act, 21 U.S.C. section 360bbb-3(b)(1), unless the authorization is terminated or revoked sooner.  Performed at [facility], [place] [address], [place], [place] [address]   Surgical pcr screen     Status: None   Collection Time: 08/27/19 10:01 AM   Specimen: Nasal Mucosa; Nasal Swab  Result Value Ref Range Status   MRSA, PCR NEGATIVE NEGATIVE Final   Staphylococcus aureus NEGATIVE NEGATIVE Final    Comment: (NOTE) The Xpert SA Assay (FDA approved for NASAL specimens in patients 65 years of age and older), is one component of a comprehensive surveillance program. It is not intended to diagnose infection nor to guide or monitor treatment. Performed at [facility], [place] [address], [place], [place] [address]   Aerobic/Anaerobic Culture (surgical/deep wound)     Status: None (Preliminary result)   Collection Time: 09/01/19  5:18 PM   Specimen: PATH Other; Tissue  Result Value Ref Range Status   Specimen Description TISSUE RIGHT LOWER LEG  Final   Special Requests NONE  Final   Gram Stain   Final    MODERATE WBC PRESENT, PREDOMINANTLY PMN NO ORGANISMS SEEN    Culture   Final    NO GROWTH 2 DAYS NO ANAEROBES ISOLATED; CULTURE IN  PROGRESS FOR 5 DAYS Performed at [facility], [place] [address], [place],  [place] [address]    Report Status PENDING  Incomplete         Radiology Studies: DG MINI C-ARM IMAGE ONLY  Result Date: 09/01/2019 There is no interpretation for this exam.  This order is for images obtained during a surgical procedure.  Please See "Surgeries" Tab for more information regarding the procedure.   Scheduled Meds: . aspirin EC  81 mg Oral Daily  . citalopram  20 mg Oral Daily  . docusate sodium  100 mg Oral BID  . enoxaparin (LOVENOX) injection  60 mg Subcutaneous Q24H  . insulin aspart  0-15 Units Subcutaneous TID WC  . insulin aspart  0-5 Units Subcutaneous QHS  . sodium chloride flush  3 mL Intravenous Once  . sodium chloride flush  3 mL Intravenous Q12H  . traMADol  50 mg Oral Q6H  . verapamil  240 mg Oral Q breakfast   Continuous Infusions: . sodium chloride 125 mL/hr at 08/26/19 1750  . sodium chloride 125 mL/hr at 08/28/19 1732  . sodium chloride    . cefTRIAXone (ROCEPHIN)  IV 2 g (09/02/19 2127)  . lactated ringers 500 mL/hr at 09/01/19 1733  . methocarbamol (ROBAXIN) IV    . metronidazole 500 mg (09/03/19 1329)     LOS: 8 days    Time spent:  25 mins.    Shawna Clamp, MD [facility]   If 7PM-7AM, please contact night-coverage

## 2019-09-03 NOTE — Progress Notes (Signed)
Inpatient Rehabilitation Medication Review by a Pharmacist  A complete drug regimen review was completed for this patient to identify any potential clinically significant medication issues.  Clinically significant medication issues were identified:  yes   Type of Medication Issue Identified Description of Issue Urgent (address now) Non-Urgent (address on AM team rounds) Plan Plan Accepted by Provider? (Yes / No / Pending AM Rounds)  Drug Interaction(s) (clinically significant)       Duplicate Therapy       Allergy       No Medication Administration End Date       Incorrect Dose       Additional Drug Therapy Needed  Prior to admission and per the discharge summary, patient should be taking lisinopril 20 mg daily and hydrochlorothiazide 25 mg daily. No current active orders for lisinopril or hydrochlorothiazide. Patient's systolic blood pressure has been above goal blood pressure.  Non-urgent Re-assess if lisinopril and/or hydrochlorothiazide needs to be restarted during this admission.  Pending   Other         Patient was admitted after 5:30 pm. For non-urgent medication issues to be resolved on tomorrow morning with provider (on 8/14).    Pharmacist comments: N/a  Time spent performing this drug regimen review (minutes):  20   Rosalyn Gess, PharmD  09/03/2019 7:44 PM

## 2019-09-03 NOTE — Plan of Care (Signed)

## 2019-09-03 NOTE — H&P (Signed)
Physical Medicine and Rehabilitation Admission H&P        Chief Complaint  Patient presents with  . Wound Check  . Wound Infection  : HPI: [name] is a 59 year old right-handed male with history of diabetes mellitus, hyperlipidemia, morbid obesity with BMI 33.04, hypertension, hepatitis C and tobacco abuse.  Per chart review patient lives with spouse.  1 level home one-step to entry.  Patient reportedly rather sedentary due to right heel ulcer.  Presented 08/26/2019 with gangrenous changes right calcaneus and recently underwent incision and drainage 08/20/2019 by Dr. Lajoyce Corners on an outpatient basis.  Presented with increasing pain and swelling of right heel as well as low-grade fever.  MRI of the right ankle and foot showed soft tissue ulcer posterior medial aspect of the hindfoot at the level of the posterior calcaneus no evidence of osteomyelitis or abscess.  Admission chemistry sodium 131, glucose 129, creatinine 0.57, WBC 10,800, hemoglobin 11.3.  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 Lajoyce Corners.  Wound VAC applied as directed.  Initially maintained on Zosyn changed to Rocephin and Flagyl initial cultures growing gram-positive cocci gram-negative rods blood cultures negative.  Postoperative hemoglobin 11.6.  Placed on Lovenox for DVT prophylaxis.  Therapy evaluations completed and patient was admitted for a comprehensive rehab program.   Review of Systems  Constitutional: Positive for fever.  HENT: Negative for hearing loss.   Eyes: Negative for blurred vision and double vision.  Respiratory: Negative for cough and shortness of breath.   Cardiovascular: Positive for leg swelling. Negative for chest pain and palpitations.  Gastrointestinal: Positive for constipation. Negative for heartburn, nausea and vomiting.  Genitourinary: Positive for urgency. Negative for dysuria, flank pain and hematuria.  Musculoskeletal: Positive for joint pain  and myalgias.  Skin: Negative for rash.  Psychiatric/Behavioral: Positive for depression.  All other systems reviewed and are negative.       Past Medical History:  Diagnosis Date  . Diabetic neuropathy ([name])    . Diverticulitis    . History of hepatitis C      has been treated in the past  . History of kidney stones    . Hyperlipidemia    . Hypertension    . Other testicular hypofunction    . Type II or unspecified type diabetes mellitus without mention of complication, not stated as uncontrolled    . Vitamin D deficiency           Past Surgical History:  Procedure Laterality Date  . CHOLECYSTECTOMY   1987  . COLONOSCOPY      . I & D EXTREMITY Right 08/20/2019    Procedure: IRRIGATION & DEBRIDEMENT RIGHT FOOT PARTIAL CALCANEAL EXCISION;  Surgeon: Nadara Mustard, MD;  Location: [facility] OR;  Service: Orthopedics;  Laterality: Right;  . I & D EXTREMITY Right 08/27/2019    Procedure: IRRIGATION AND DEBRIDEMENT  OF FOOT;  Surgeon: Tarry Kos, MD;  Location: [facility] OR;  Service: Orthopedics;  Laterality: Right;  . ORIF TIBIA FRACTURE        x 3 right leg         Family History  Problem Relation Age of Onset  . Hypertension Mother    . Cancer Father          colon  . Alzheimer's disease Father    . Stroke Father      Social History:  reports that he has been smoking cigarettes. He has been smoking  about 1.00 pack per day. He has never used smokeless tobacco. He reports current drug use. Drug: Marijuana. He reports that he does not drink alcohol. Allergies:       Allergies  Allergen Reactions  . Invokamet [Canagliflozin-Metformin Hcl] Other (See Comments)      Extremity edema/caused pain  . Invokana [Canagliflozin] Other (See Comments)      Extremity edema/caused pain  . Codeine Camsylate [Codeine] Rash  . Morphine And Related Rash          Medications Prior to Admission  Medication Sig Dispense Refill  . aspirin 81 MG tablet Take 81 mg by mouth daily.      . cefdinir  (OMNICEF) 300 MG capsule Take 1 capsule (300 mg total) by mouth 2 (two) times daily. 60 capsule 0  . Cholecalciferol 50000 units TABS Take 1 tablet by mouth 2 (two) times a week.       . citalopram (CELEXA) 40 MG tablet Take 1 tablet Daily for Mood & Anxiety (Patient taking differently: Take 40 mg by mouth daily. For mood & anxiety) 90 tablet 0  . gabapentin (NEURONTIN) 800 MG tablet Take 1/2 to 1 tablet     2 to 3 x /day as needed for Neuropathy Pains (Patient taking differently: Take 800-1,600 mg by mouth See admin instructions. Take 1 tablet ( ) by mouth in the morning and 2 tablets ( ) by mouth in the evening.) 360 tablet 0  . glipiZIDE (GLUCOTROL) 5 MG tablet Take 1 tablet 3 x /day with Meals for Diabetes (Patient taking differently: 5 mg 2 (two) times daily with a meal. ) 270 tablet 0  . hydrochlorothiazide (HYDRODIURIL) 25 MG tablet Take 1 tablet by mouth once daily (Patient taking differently: Take 25 mg by mouth daily. ) 90 tablet 0  . lisinopril (ZESTRIL) 20 MG tablet Take 1 tablet Daily for BP & Diabetic Kidney Protection (Patient taking differently: Take 20 mg by mouth daily. For blood pressure & diabetic kidney protection) 90 tablet 0  . meclizine (ANTIVERT) 25 MG tablet 1/2-1 pill up to 3 times daily for motion sickness/dizziness (Patient taking differently: Take 25 mg by mouth every evening. ) 90 tablet 2  . metFORMIN (GLUCOPHAGE-XR) 500 MG 24 hr tablet Take 1/2 to 1 or  2 tablets 2 x /day with Meals for Diabetes for Diabetes (Patient taking differently: Take 500 mg by mouth 2 (two) times daily with a meal. For diabetes) 120 tablet 2  . oxyCODONE-acetaminophen (PERCOCET) 5-325 MG tablet Take 1 tablet by mouth every 4 (four) hours as needed. (Patient taking differently: Take 1 tablet by mouth every 4 (four) hours as needed for moderate pain or severe pain. ) 30 tablet 0  . STEGLATRO 15 MG TABS Take 15 mg by mouth daily before breakfast. 90 tablet 3  . verapamil (CALAN-SR) 240 MG CR  tablet Take 1 tablet Daily with Food for BP & Heart  Rhythm (Patient taking differently: Take 240 mg by mouth daily. For blood pressure & heart rhythm. Take with food) 90 tablet 1  . doxycycline (VIBRA-TABS) 100 MG tablet Take 1 tablet (100 mg total) by mouth 2 (two) times daily. (Patient not taking: Reported on 08/26/2019) 60 tablet 0  . meloxicam (MOBIC) 15 MG tablet Take one daily with food for 2 weeks, can take with tylenol, can not take with aleve, iburpofen, then as needed daily for pain (Patient not taking: Reported on 08/26/2019) 30 tablet 1      Drug Regimen Review Drug regimen was  reviewed and remains appropriate no significant issues identified   Home: Home Living Family/patient expects to be discharged to:: Private residence Living Arrangements: Spouse/significant other Available Help at Discharge: Family Type of Home: House Home Access: Stairs to enter Secretary/administrator of Steps: 1 Entrance Stairs-Rails: None Home Layout: One level Bathroom Shower/Tub: Tub/shower unit, Engineer, building services: Standard Bathroom Accessibility: Yes Home Equipment: Grab bars - tub/shower, Hand held shower head Additional Comments: grab bar in shower and hand held shower    Functional History: Prior Function Level of Independence: Needs assistance Gait / Transfers Assistance Needed: Walking with a cane; limited household distances over past 2-3 months b/c of R foot pain; prior to that he was completely independent with gait (community ambulation), ADLs, and IADLs ADL's / Homemaking Assistance Needed: independent   Functional Status:  Mobility: Bed Mobility Overal bed mobility: Needs Assistance Bed Mobility: Supine to Sit Supine to sit: Supervision, HOB elevated Sit to supine: Min guard, HOB elevated General bed mobility comments: supervision for safety Transfers Overall transfer level: Needs assistance Equipment used: Rolling walker (2 wheeled) Transfers: Sit to/from Stand, [employer] Transfers Sit to Stand: Mod assist Stand pivot transfers: Min assist General transfer comment: Standing from chair (non elevated) required mod A and 2 attempts.  Pt recalled safe hand placement cues from OT.  Min A for steadying for pivot once upright.  Cued on pivots to R side for increase ease at this time. Ambulation/Gait Ambulation/Gait assistance: Mod assist Gait Distance (Feet): 5 Feet Assistive device: Rolling walker (2 wheeled) Gait Pattern/deviations: Step-to pattern, Trunk flexed General Gait Details: deferred Gait velocity: decreased    ADL: ADL Overall ADL's : Needs assistance/impaired Eating/Feeding: Modified independent, Sitting Grooming: Set up, Sitting Upper Body Bathing: Min guard, Sitting (sitting at EOB ) Lower Body Bathing: Minimal assistance, Sitting/lateral leans Upper Body Dressing : Min guard, Sitting Lower Body Dressing: Minimal assistance, Sitting/lateral leans Lower Body Dressing Details (indicate cue type and reason): assist to don sock sitting EOB Toilet Transfer: Stand-pivot, RW, Minimal assistance Toilet Transfer Details (indicate cue type and reason): simulated from elevated EOB to recliner Toileting- Clothing Manipulation and Hygiene: Minimal assistance, Sit to/from stand Toileting - Clothing Manipulation Details (indicate cue type and reason): from EOB elevated Functional mobility during ADLs: Rolling walker, Minimal assistance General ADL Comments: sat EOB with good control;stand-pivot transfer to recliner pt pivoting foot on floor;educated pt on activity progression, fall prevention,    Cognition: Cognition Overall Cognitive Status: Within Functional Limits for tasks assessed Orientation Level: Oriented X4 Cognition Arousal/Alertness: Awake/alert Behavior During Therapy: WFL for tasks assessed/performed Overall Cognitive Status: Within Functional Limits for tasks assessed   Physical Exam: Blood pressure 127/67, pulse 63, temperature (!)  97.4 F (36.3 C), temperature source Oral, resp. rate 17, height 6\' 3"  (1.905 m), weight 119.9 kg, SpO2 95 %. Physical Exam Constitutional:      Comments: obese  HENT:     Head: Normocephalic.     Right Ear: External ear normal.     Left Ear: External ear normal.  Cardiovascular:     Rate and Rhythm: Normal rate and regular rhythm.  Pulmonary:     Effort: Pulmonary effort is normal.  Abdominal:     General: Bowel sounds are normal.     Palpations: Abdomen is soft.     Tenderness: There is no abdominal tenderness.  Musculoskeletal:     Cervical back: Normal range of motion.     Comments: Right leg tender with movement and touch  Skin:  General: Skin is warm.     Comments: Wound VAC in place to BKA.  Appropriately tender  Neurological:     General: No focal deficit present.     Mental Status: He is oriented to person, place, and time.     Cranial Nerves: No cranial nerve deficit.     Comments: Patient is alert in no acute distress.  Follows commands.  Oriented x3.  Psychiatric:        Mood and Affect: Mood normal.        Lab Results Last 48 Hours        Results for orders placed or performed during the hospital encounter of 08/26/19 (from the past 48 hour(s))  Glucose, capillary     Status: Abnormal    Collection Time: 08/31/19  4:28 PM  Result Value Ref Range    Glucose-Capillary 133 (H) 70 - 99 mg/dL      Comment: Glucose reference range applies only to samples taken after fasting for at least 8 hours.  Glucose, capillary     Status: Abnormal    Collection Time: 08/31/19  8:33 PM  Result Value Ref Range    Glucose-Capillary 104 (H) 70 - 99 mg/dL      Comment: Glucose reference range applies only to samples taken after fasting for at least 8 hours.  Glucose, capillary     Status: Abnormal    Collection Time: 09/01/19  6:53 AM  Result Value Ref Range    Glucose-Capillary 117 (H) 70 - 99 mg/dL      Comment: Glucose reference range applies only to samples taken after  fasting for at least 8 hours.  CBC     Status: Abnormal    Collection Time: 09/01/19 10:46 AM  Result Value Ref Range    WBC 6.7 4.0 - 10.5 K/uL    RBC 3.82 (L) 4.22 - 5.81 MIL/uL    Hemoglobin 11.8 (L) 13.0 - 17.0 g/dL    HCT 55.7 (L) 39 - 52 %    MCV 94.8 80.0 - 100.0 fL    MCH 30.9 26.0 - 34.0 pg    MCHC 32.6 30.0 - 36.0 g/dL    RDW 32.2 02.5 - 42.7 %    Platelets 292 150 - 400 K/uL    nRBC 0.0 0.0 - 0.2 %      Comment: Performed at [facility], [address], [place], [place] [address]  Basic metabolic panel     Status: Abnormal    Collection Time: 09/01/19 10:46 AM  Result Value Ref Range    Sodium 135 135 - 145 mmol/L    Potassium 4.2 3.5 - 5.1 mmol/L    Chloride 101 98 - 111 mmol/L    CO2 26 22 - 32 mmol/L    Glucose, Bld 110 (H) 70 - 99 mg/dL      Comment: Glucose reference range applies only to samples taken after fasting for at least 8 hours.    BUN 8 6 - 20 mg/dL    Creatinine, Ser 6.28 (L) 0.61 - 1.24 mg/dL    Calcium 8.2 (L) 8.9 - 10.3 mg/dL    GFR calc non Af Amer >60 >60 mL/min    GFR calc Af Amer >60 >60 mL/min    Anion gap 8 5 - 15      Comment: Performed at [facility], [address], [place], [place] [address]  Glucose, capillary     Status: Abnormal    Collection  Time: 09/01/19 11:39 AM  Result Value Ref Range    Glucose-Capillary 109 (H) 70 - 99 mg/dL      Comment: Glucose reference range applies only to samples taken after fasting for at least 8 hours.  Glucose, capillary     Status: Abnormal    Collection Time: 09/01/19  1:29 PM  Result Value Ref Range    Glucose-Capillary 102 (H) 70 - 99 mg/dL      Comment: Glucose reference range applies only to samples taken after fasting for at least 8 hours.  Glucose, capillary     Status: None    Collection Time: 09/01/19  4:25 PM  Result Value Ref Range    Glucose-Capillary 99 70 - 99 mg/dL      Comment: Glucose reference range applies only to samples taken after fasting for at least 8 hours.    Aerobic/Anaerobic Culture (surgical/deep wound)     Status: None (Preliminary result)    Collection Time: 09/01/19  5:18 PM    Specimen: PATH Other; Tissue  Result Value Ref Range    Specimen Description TISSUE RIGHT LOWER LEG      Special Requests NONE      Gram Stain          MODERATE WBC PRESENT, PREDOMINANTLY PMN NO ORGANISMS SEEN      Culture          NO GROWTH < 24 HOURS Performed at [facility], [address] [place], [place] [address]      Report Status PENDING    Glucose, capillary     Status: Abnormal    Collection Time: 09/01/19  5:47 PM  Result Value Ref Range    Glucose-Capillary 112 (H) 70 - 99 mg/dL      Comment: Glucose reference range applies only to samples taken after fasting for at least 8 hours.  Glucose, capillary     Status: Abnormal    Collection Time: 09/02/19  6:39 AM  Result Value Ref Range    Glucose-Capillary 146 (H) 70 - 99 mg/dL      Comment: Glucose reference range applies only to samples taken after fasting for at least 8 hours.  Glucose, capillary     Status: Abnormal    Collection Time: 09/02/19 11:32 AM  Result Value Ref Range    Glucose-Capillary 213 (H) 70 - 99 mg/dL      Comment: Glucose reference range applies only to samples taken after fasting for at least 8 hours.       Imaging Results (Last 48 hours)  DG MINI C-ARM IMAGE ONLY   Result Date: 09/01/2019 There is no interpretation for this exam.  This order is for images obtained during a surgical procedure.  Please See "Surgeries" Tab for more information regarding the procedure.             Medical Problem List and Plan: 1.  Decreased functional mobility secondary to [name] 08/31/2019.                -patient may not yet shower             -ELOS/Goals: 7-10 days, mod I to supervision              -admit to inpatient rehab 2.  Antithrombotics: -DVT/anticoagulation: Lovenox             -antiplatelet therapy: Aspirin 81 mg daily 3. Pain Management: Tramadol 50 mg  every 6 hours, oxycodone  and Robaxin as  needed 4.  Mood.  Celexa 20 mg daily 5.  Neuropsychology.  Patient is capable of making decisions on his own behalf 6.  Skin/wound.  Routine skin checks             -wound vac for 7 days post-op 7.  Fluids/electrolytes/nutrition.  Strict I&O's with follow-up chemistries ordered 8.  Acute blood loss anemia.  Follow-up CBC 9.  Diabetes mellitus with peripheral neuropathy.  Hemoglobin A1c 6.6.  SSI.  Patient on Glucotrol 5 mg twice daily prior to admission as well as Glucophage 500 mg 1 tablet twice daily.  Resume as needed 10.  Hypertension.  Verapamil 240 mg daily             -monitor variance with therapy/pain 11.  Tobacco abuse.  Counseling     Mcarthur Rossetti Angiulli, PA-C 09/02/2019  I have personally performed a face to face diagnostic evaluation of this patient and formulated the key components of the plan.  Additionally, I have personally reviewed laboratory data, imaging studies, as well as relevant notes and concur with the physician assistant's documentation above.  The patient's status has not changed from the original H&P.  Any changes in documentation from the acute care chart have been noted above.  Ranelle Oyster, MD, [place]

## 2019-09-03 NOTE — Progress Notes (Signed)
PT Cancellation Note  Patient Details Name: [name] MRN: [id number] DOB: 09-10-1960   Cancelled Treatment:    Reason Eval/Treat Not Completed: (P) Patient declined, no reason specified (Pt refused tx reports he wished to rest as he has just gotten comfortable, Will return if time permits as he is aggreeable to participate later this pm.)   Florestine Avers 09/03/2019, 3:46 PM  Bonney Leitz , PTA Acute Rehabilitation Services Pager [phone] Office [phone]

## 2019-09-03 NOTE — Progress Notes (Addendum)
Inpatient Rehabilitation Admissions Coordinator  I met with patient at bedside with his wife. I continue to await insurance authorization for a possible Cir admit.  Danne Baxter, RN, MSN Rehab Admissions Coordinator [phone] 09/03/2019 3:03 PM   I have received insurance approval and CIR bed available today. I have contacted Dr. Dwyane Dee and will make arrangements to admit today.  Danne Baxter, RN, MSN Rehab Admissions Coordinator [phone] 09/03/2019 4:14 PM

## 2019-09-03 NOTE — Progress Notes (Signed)
Patient received on IP Rehab unit around 1845.  Alert and oriented with clear speech and appropriate responses.  Demonstrated ability to articulate his needs.  Continent of bowel and bladder.  R BKA with NPWT continuous at 125 mmHg.  Dressing secure and c/d/i.  SCD in use to LLE.  Patient receiving IVPB infusions of Rocephin 2 gm Q24H and Flagyl 500 mg TID via patent PIV #20 gauge to left antecubital.  Patient denies any pain, discomfort or disturbance at this time.  Patient's wife, [name], present at bedside with teaching performed.  Both verbalize understanding with teachback.  Admission documentation completed.  Care Plan initiated.  No further needs or concerns at the time of this writing.  Nursing will continue to monitor closely.    Lynnette Pote Cleopatra Cedar, MSN, RN, CNL IP Rehab

## 2019-09-04 ENCOUNTER — Encounter (HOSPITAL_COMMUNITY): Payer: Self-pay | Admitting: Physical Medicine & Rehabilitation

## 2019-09-04 ENCOUNTER — Other Ambulatory Visit: Payer: Self-pay

## 2019-09-04 ENCOUNTER — Inpatient Hospital Stay (HOSPITAL_COMMUNITY): Payer: BC Managed Care – PPO | Admitting: Occupational Therapy

## 2019-09-04 ENCOUNTER — Inpatient Hospital Stay (HOSPITAL_COMMUNITY): Payer: BC Managed Care – PPO | Admitting: Physical Therapy

## 2019-09-04 LAB — GLUCOSE, CAPILLARY
Glucose-Capillary: 119 mg/dL — ABNORMAL HIGH (ref 70–99)
Glucose-Capillary: 118 mg/dL — ABNORMAL HIGH (ref 70–99)
Glucose-Capillary: 139 mg/dL — ABNORMAL HIGH (ref 70–99)
Glucose-Capillary: 131 mg/dL — ABNORMAL HIGH (ref 70–99)

## 2019-09-04 NOTE — Evaluation (Signed)
Physical Therapy Assessment and Plan  Patient Details  Name: [name] MRN: [id number] Date of Birth: 10-06-1960  PT Diagnosis: Difficulty walking, Muscle weakness and Pain in RLE Rehab Potential: Good ELOS: 10-14 days   Today's Date: 09/04/2019 PT Individual Time: 3419-6222 PT Individual Time Calculation (min): 70 min    Hospital Problem: Principal Problem:   Right below-knee amputee [facility])   Past Medical History:  Past Medical History:  Diagnosis Date  . Diabetic neuropathy ([place])   . Diverticulitis   . History of hepatitis C    has been treated in the past  . History of kidney stones   . Hyperlipidemia   . Hypertension   . Other testicular hypofunction   . Type II or unspecified type diabetes mellitus without mention of complication, not stated as uncontrolled   . Vitamin D deficiency    Past Surgical History:  Past Surgical History:  Procedure Laterality Date  . AMPUTATION Right 09/01/2019   Procedure: RIGHT BELOW KNEE AMPUTATION;  Surgeon: Newt Minion, MD;  Location: [place];  Service: Orthopedics;  Laterality: Right;  . CHOLECYSTECTOMY  1987  . COLONOSCOPY    . I & D EXTREMITY Right 08/20/2019   Procedure: IRRIGATION & DEBRIDEMENT RIGHT FOOT PARTIAL CALCANEAL EXCISION;  Surgeon: Newt Minion, MD;  Location: [place];  Service: Orthopedics;  Laterality: Right;  . I & D EXTREMITY Right 08/27/2019   Procedure: IRRIGATION AND DEBRIDEMENT  OF FOOT;  Surgeon: Leandrew Koyanagi, MD;  Location: [place];  Service: Orthopedics;  Laterality: Right;  . ORIF TIBIA FRACTURE     x 3 right leg    Assessment & Plan Clinical Impression: Patient is a 59 year old right-handed male with history of diabetes mellitus, hyperlipidemia,morbid obesity with BMI 33.04,hypertension, hepatitis C and tobacco abuse.Per chart review patient lives with spouse. 1 level home one-step to entry. Patient reportedly rather sedentary due to right heel ulcer. Presented 8/5/2021with gangrenous changes  right calcaneus and recently underwent incision and drainage 08/20/2019 by [name] on an outpatient basis. Presented with increasing pain and swelling of right heelas well as low-grade fever. MRI of the right ankle and foot showed soft tissue ulcer posterior medial aspect of the hindfoot at the level of the posterior calcaneus no evidence of osteomyelitis or abscess. Admission chemistry sodium 131, glucose 129, creatinine 0.57, WBC 10,800, hemoglobin 11.3. 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. Wound VAC applied as directed.  Patient transferred to CIR on 09/03/2019 .   Patient currently requires mod with mobility secondary to muscle weakness and muscle joint tightness, decreased cardiorespiratoy endurance and decreased sitting balance, decreased standing balance, decreased balance strategies and difficulty maintaining precautions.  Prior to hospitalization, patient was independent  with mobility and lived with Spouse in a House home.  Home access is 1Ramped entrance.  Patient will benefit from skilled PT intervention to maximize safe functional mobility, minimize fall risk and decrease caregiver burden for planned discharge home with intermittent assist.  Anticipate patient will benefit from follow up [facility] at discharge.  PT - End of Session Activity Tolerance: Tolerates < 10 min activity, no significant change in vital signs Endurance Deficit: Yes Endurance Deficit Description: Rest breaks within BADL tasks PT Assessment Rehab Potential (ACUTE/IP ONLY): Good PT Barriers to Discharge: Inaccessible home environment;Home environment access/layout;IV antibiotics;Wound Care;Insurance for SNF coverage;Weight;Weight bearing restrictions PT Patient demonstrates impairments in the following area(s): Safety;Balance;Edema;Endurance;Motor;Pain;Sensory;Skin Integrity PT Transfers Functional Problem(s): Bed Mobility;Bed to Chair;Car;Furniture;Floor PT  Locomotion Functional Problem(s): Ambulation;Wheelchair Mobility;Stairs PT Plan PT Intensity: Minimum of 1-2 x/day ,45 to 90 minutes PT Frequency: 5 out of 7 days PT Duration Estimated Length of Stay: 10-14 days PT Treatment/Interventions: Ambulation/gait training;Community reintegration;DME/adaptive equipment instruction;Neuromuscular re-education;Psychosocial support;Stair training;Wheelchair propulsion/positioning;UE/LE Strength taining/ROM;UE/LE Coordination activities;Skin care/wound management;Therapeutic Activities;Pain management;Discharge planning;Balance/vestibular training;Cognitive remediation/compensation;Disease management/prevention;Functional mobility training;Patient/family education;Splinting/orthotics;Therapeutic Exercise;Visual/perceptual remediation/compensation PT Transfers Anticipated Outcome(s): Mod I with LRAD PT Locomotion Anticipated Outcome(s): Mod I WC mobility. Min assist gait for short distances with LRAD PT Recommendation Recommendations for Other Services: Therapeutic Recreation consult Therapeutic Recreation Interventions: Stress management;Outing/community reintergration Follow Up Recommendations: Home health PT Patient destination: Home Equipment Recommended: Wheelchair (measurements);Wheelchair cushion (measurements);Rolling walker with 5" wheels   PT Evaluation Precautions/Restrictions Precautions Precautions: Fall Restrictions Weight Bearing Restrictions: Yes RLE Weight Bearing: Non weight bearing General   Vital SignsTherapy Vitals Temp: 98.1 F (36.7 C) Temp Source: Oral Pulse Rate: 71 Resp: 18 BP: 125/67 Patient Position (if appropriate): Lying Oxygen Therapy SpO2: 94 % O2 Device: Room Air Pain   denies at rest  Home Living/Prior [place] Available Help at Discharge: Family;Available 24 hours/day Type of Home: House Home Access: Ramped entrance Entrance Stairs-Number of Steps: 1 Entrance Stairs-Rails: None Home  Layout: One level Bathroom Shower/Tub: Product/process development scientist: Standard Bathroom Accessibility: Yes Additional Comments: grab bar in shower and hand held shower   Lives With: Spouse Prior Function Level of Independence: Independent with basic ADLs;Independent with homemaking with ambulation  Able to Take Stairs?: Yes Driving: Yes Vocation: Full time employment Vocation Requirements: Works as a Biochemist, clinical, on the phone a lot Comments: ramp installed on house 8/14 Vision/Perception  Perception Perception: Within Functional Limits Praxis Praxis: Intact  Cognition Overall Cognitive Status: Within Functional Limits for tasks assessed Arousal/Alertness: Awake/alert Memory: Appears intact Immediate Memory Recall: Sock;Blue;Bed Memory Recall Sock: Without Cue Memory Recall Blue: Without Cue Memory Recall Bed: Without Cue Safety/Judgment: Appears intact Sensation Sensation Light Touch: Appears Intact Hot/Cold: Appears Intact Proprioception: Appears Intact Additional Comments: pt reports intermittent mild phantom sensation. Coordination Gross Motor Movements are Fluid and Coordinated: No Fine Motor Movements are Fluid and Coordinated: Yes Coordination and Movement Description: pain limiting coordination on the RLE. Finger Nose Finger Test: [facility] Motor  Motor Motor: Other (comment) Motor - Skilled Clinical Observations: generalized weakness   Trunk/Postural Assessment  Cervical Assessment Cervical Assessment: Within Functional Limits Thoracic Assessment Thoracic Assessment: Within Functional Limits Lumbar Assessment Lumbar Assessment: Within Functional Limits Postural Control Postural Control: Within Functional Limits  Balance Balance Balance Assessed: Yes Static Sitting Balance Static Sitting - Level of Assistance: 6: Modified independent (Device/Increase time) Dynamic Sitting Balance Dynamic Sitting - Balance Support: No upper extremity supported;During  functional activity Dynamic Sitting - Level of Assistance: 5: Stand by assistance;4: Min assist Static Standing Balance Static Standing - Balance Support: Bilateral upper extremity supported Static Standing - Level of Assistance: 4: Min assist Dynamic Standing Balance Dynamic Standing - Balance Support: Bilateral upper extremity supported;During functional activity Dynamic Standing - Level of Assistance: 3: Mod assist Extremity Assessment  RUE Assessment RUE Assessment: Within Functional Limits LUE Assessment LUE Assessment: Exceptions to [facility] LUE Strength LUE Overall Strength Comments: Shoulder weakness 2/2 old injury Left Shoulder Flexion: 3+/5 RLE Assessment RLE Assessment: Within Functional Limits General Strength Comments: grossly 4+/5 proximal to distal LLE Assessment LLE Assessment: Exceptions to [facility] General Strength Comments: hip flexion/abduction/adduction 4/5. knee flexion extension at least 3/5 with dyskinetic movements  Care Tool Care Tool Bed Mobility Roll left and right activity  Roll left and right assist level: Contact Guard/Touching assist    Sit to lying activity   Sit to lying assist level: Minimal Assistance - Patient > 75%    Lying to sitting edge of bed activity   Lying to sitting edge of bed assist level: Minimal Assistance - Patient > 75%     Care Tool Transfers Sit to stand transfer Sit to stand activity did not occur: Safety/medical concerns Sit to stand assist level: Moderate Assistance - Patient 50 - 74%    Chair/bed transfer   Chair/bed transfer assist level: Moderate Assistance - Patient 50 - 74%     Toilet transfer   Assist Level: Moderate Assistance - Patient 50 - 74%    Car transfer Car transfer activity did not occur: Safety/medical concerns        Care Tool Locomotion Ambulation Ambulation activity did not occur: Safety/medical concerns        Walk 10 feet activity Walk 10 feet activity did not occur: Safety/medical concerns        Walk 50 feet with 2 turns activity Walk 50 feet with 2 turns activity did not occur: Safety/medical concerns      Walk 150 feet activity Walk 150 feet activity did not occur: Safety/medical concerns      Walk 10 feet on uneven surfaces activity Walk 10 feet on uneven surfaces activity did not occur: Safety/medical concerns      Stairs Stair activity did not occur: Safety/medical concerns        Walk up/down 1 step activity Walk up/down 1 step or curb (drop down) activity did not occur: Safety/medical concerns     Walk up/down 4 steps activity did not occuR: Safety/medical concerns  Walk up/down 4 steps activity      Walk up/down 12 steps activity Walk up/down 12 steps activity did not occur: Safety/medical concerns      Pick up small objects from floor Pick up small object from the floor (from standing position) activity did not occur: Safety/medical concerns      Wheelchair   Type of Wheelchair: Manual   Wheelchair assist level: Supervision/Verbal cueing Max wheelchair distance: 150  Wheel 50 feet with 2 turns activity   Assist Level: Supervision/Verbal cueing  Wheel 150 feet activity   Assist Level: Supervision/Verbal cueing    Refer to Care Plan for Long Term Goals  SHORT TERM GOAL WEEK 1 PT Short Term Goal 1 (Week 1): Pt will consisently perform bed mobility with supervision assist PT Short Term Goal 2 (Week 1): Pt will transfer to and from [facility] with CGA consistently PT Short Term Goal 3 (Week 1): Pt will propell WC with supervision assist >239f PT Short Term Goal 4 (Week 1): Pt will initate gait training in parallel bars with mod assist up to 5 ft  Recommendations for other services: Therapeutic Recreation  Stress management and Outing/community reintegration  Skilled Therapeutic Intervention   Pt received supine in bed and agreeable to PT. Supine>sit transfer with min assist and cues for use of UE. PT instructed patient in PT Evaluation and initiated  treatment intervention; see below for results. PT educated [place], rehab goals, and discharge recommendations. Pt performed squat pivot and lateral scoot transfer with min-mod assist with moderate cues for safety and set up. Attempted gait training but unable to utilize UE to prevent hopping on the LLE. WC mobility with supervision assist at listed above. PT educated pt on car transfer vs truck transfer, but did  not attempt at this time. Pt returned to room and performed SB transfer to bed with min assist and moderate cues for technique. Sit>supine completed with heavy use of rails and supervision assist. Pt left supine in bed with call bell in reach and all needs met.     Mobility Bed Mobility Bed Mobility: (P) Sit to Supine;Supine to Sit;Rolling Right;Rolling Left Rolling Right: (P) Minimal Assistance - Patient > 75% Rolling Left: (P) Minimal Assistance - Patient > 75% Supine to Sit: (P) Minimal Assistance - Patient > 75% Sit to Supine: (P) Minimal Assistance - Patient > 75% Transfers Transfers: (P) Lateral/Scoot Transfers;Sit to Stand;Stand to Sit Sit to Stand: (P) Moderate Assistance - Patient 50-74% Stand to Sit: (P) Moderate Assistance - Patient 50-74% Lateral/Scoot Transfers: (P) Moderate Assistance - Patient 50-74% Locomotion  Gait Ambulation: No Gait Gait: No Stairs / Additional Locomotion Stairs: No Wheelchair Mobility Wheelchair Mobility: Yes Wheelchair Assistance: Chartered loss adjuster: Both upper extremities Wheelchair Parts Management: Needs assistance Distance: 150   Discharge Criteria: Patient will be discharged from PT if patient refuses treatment 3 consecutive times without medical reason, if treatment goals not met, if there is a change in medical status, if patient makes no progress towards goals or if patient is discharged from hospital.  The above assessment, treatment plan, treatment alternatives and goals were  discussed and mutually agreed upon: by patient  Lorie Phenix 09/04/2019, 3:22 PM

## 2019-09-04 NOTE — Progress Notes (Signed)
Occupational Therapy Assessment and Plan  Patient Details  Name: [name] MRN: [id number] Date of Birth: [birth year]  OT Diagnosis: muscle weakness (generalized) and Right above knee ambultation, decreased activity tolerance Rehab Potential: Rehab Potential (ACUTE ONLY): Excellent ELOS: 12-14 days   Today's Date: 09/04/2019  Session 1 OT Individual Time: 4097-3532 OT Individual Time Calculation (min): 72 min     Session 2 OT Individual Time: 1102-1200 OT Individual Time Calculation (min): 58 min     Hospital Problem: Principal Problem:   Right below-knee amputee [facility])   Past Medical History:  Past Medical History:  Diagnosis Date  . Diabetic neuropathy ([place])   . Diverticulitis   . History of hepatitis C    has been treated in the past  . History of kidney stones   . Hyperlipidemia   . Hypertension   . Other testicular hypofunction   . Type II or unspecified type diabetes mellitus without mention of complication, not stated as uncontrolled   . Vitamin D deficiency    Past Surgical History:  Past Surgical History:  Procedure Laterality Date  . AMPUTATION Right 09/01/2019   Procedure: RIGHT BELOW KNEE AMPUTATION;  Surgeon: Newt Minion, MD;  Location: [place];  Service: Orthopedics;  Laterality: Right;  . CHOLECYSTECTOMY  1987  . COLONOSCOPY    . I & D EXTREMITY Right 08/20/2019   Procedure: IRRIGATION & DEBRIDEMENT RIGHT FOOT PARTIAL CALCANEAL EXCISION;  Surgeon: Newt Minion, MD;  Location: [place];  Service: Orthopedics;  Laterality: Right;  . I & D EXTREMITY Right 08/27/2019   Procedure: IRRIGATION AND DEBRIDEMENT  OF FOOT;  Surgeon: Leandrew Koyanagi, MD;  Location: [place];  Service: Orthopedics;  Laterality: Right;  . ORIF TIBIA FRACTURE     x 3 right leg    Assessment & Plan Clinical Impression: Patient is a 59 y.o. year old male with recent admission to the hospital on s/p [name]. PMH includes but not limited to: hypertension, type 2 diabetes mellitus multiple  other comorbidities including diagnosis of osteomyelitis of right calcaneus and abscess of the right calcaneus is status post incision and drainage done on 08/20/2019. Pt reports L shoulder injury last October and is still weak. Patient transferred to CIR on 09/03/2019 .    Patient currently requires mod with basic self-care skills secondary to muscle weakness, decreased cardiorespiratoy endurance and decreased sitting balance, decreased standing balance, decreased balance strategies and difficulty maintaining precautions.  Prior to hospitalization, patient could complete BADL with independent .  Patient will benefit from skilled intervention to increase independence with basic self-care skills prior to discharge home with care partner.  Anticipate patient will require 24 hour supervision and follow up home health.  OT - End of Session Endurance Deficit: Yes Endurance Deficit Description: Rest breaks within BADL tasks OT Assessment Rehab Potential (ACUTE ONLY): Excellent OT Patient demonstrates impairments in the following area(s): Balance;Endurance;Motor;Pain OT Basic ADL's Functional Problem(s): Grooming;Bathing;Dressing;Toileting OT Transfers Functional Problem(s): Tub/Shower;Toilet OT Additional Impairment(s): None OT Plan OT Intensity: Minimum of 1-2 x/day, 45 to 90 minutes OT Frequency: 5 out of 7 days OT Duration/Estimated Length of Stay: 12-14 days OT Treatment/Interventions: Medical illustrator training;Community reintegration;Discharge planning;Disease mangement/prevention;DME/adaptive equipment instruction;Functional mobility training;Pain management;Patient/family education;Psychosocial support;Self Care/advanced ADL retraining;Skin care/wound managment;Therapeutic Activities;Therapeutic Exercise;UE/LE Strength taining/ROM;UE/LE Coordination activities;Wheelchair propulsion/positioning OT Self Feeding Anticipated Outcome(s): Independent OT Basic Self-Care Anticipated Outcome(s):  Supervision/mod I OT Toileting Anticipated Outcome(s): supervision OT Bathroom Transfers Anticipated Outcome(s): supervision OT Recommendation Patient destination: Home Follow Up Recommendations: Home health  OT Equipment Recommended: To be determined Equipment Details: Pt will likely need drop arm BSC and tub transfer bench   OT Evaluation Precautions/Restrictions  Precautions Precautions: Fall Restrictions Weight Bearing Restrictions: Yes RLE Weight Bearing: Non weight bearing Pain  Pt denies pain at this time Home Living/Prior [place] expects to be discharged to:: Private residence Living Arrangements: Spouse/significant other Available Help at Discharge: Family, Available 24 hours/day Type of Home: House Home Access: Ramped entrance Entrance Stairs-Number of Steps: 1 Entrance Stairs-Rails: None Home Layout: One level Bathroom Shower/Tub: Tub/shower unit, Architectural technologist: Standard Bathroom Accessibility: Yes Additional Comments: grab bar in shower and hand held shower   Lives With: Spouse IADL History Leisure and Hobbies: Enjoys fishing and used to ride [employer] Prior Function Level of Independence: Independent with basic ADLs, Independent with homemaking with ambulation  Able to Take Stairs?: Yes Driving: Yes Vocation: Full time employment Vocation Requirements: Works as a Biochemist, clinical, on the phone a lot Comments: ramp installed on house 8/14 Vision Baseline Vision/History: Wears glasses Wears Glasses: Reading only Patient Visual Report: No change from baseline Vision Assessment?: No apparent visual deficits Perception  Perception: Within Functional Limits Praxis Praxis: Intact Cognition Overall Cognitive Status: Within Functional Limits for tasks assessed Arousal/Alertness: Awake/alert Orientation Level: Person;Place;Situation Person: Oriented Place: Oriented Situation: Oriented Year: 2021 Month: August Day  of Week: Correct Memory: Appears intact Immediate Memory Recall: Sock;Blue;Bed Memory Recall Sock: Without Cue Memory Recall Blue: Without Cue Memory Recall Bed: Without Cue Safety/Judgment: Appears intact Sensation Sensation Light Touch: Appears Intact Hot/Cold: Appears Intact Proprioception: Appears Intact Additional Comments: pt reports intermittent mild phantom sensation. Coordination Gross Motor Movements are Fluid and Coordinated: No Fine Motor Movements are Fluid and Coordinated: Yes Coordination and Movement Description: pain limiting coordination on the RLE. Finger Nose Finger Test: [facility] Motor  Motor Motor: Other (comment) Motor - Skilled Clinical Observations: generalized weakness  Balance Static Sitting Balance Static Sitting - Level of Assistance: 5: Stand by assistance Dynamic Sitting Balance Dynamic Sitting - Balance Support: During functional activity Dynamic Sitting - Level of Assistance: 4: Min assist Extremity/Trunk Assessment RUE Assessment RUE Assessment: Within Functional Limits LUE Assessment LUE Assessment: Exceptions to [facility] LUE Strength LUE Overall Strength Comments: Shoulder weakness 2/2 old injury Left Shoulder Flexion: 3+/5  Care Tool Care Tool Self Care Eating   Eating Assist Level: Set up assist    Oral Care    Oral Care Assist Level: Supervision/Verbal cueing    Bathing   Body parts bathed by patient: Right arm;Left arm;Chest;Abdomen;Right upper leg;Left upper leg;Face Body parts bathed by helper: Left lower leg;Buttocks;Front perineal area   Assist Level: Moderate Assistance - Patient 50 - 74%    Upper Body Dressing(including orthotics)   What is the patient wearing?: Pull over shirt   Assist Level: Supervision/Verbal cueing    Lower Body Dressing (excluding footwear)   What is the patient wearing?: Pants;Underwear/pull up Assist for lower body dressing: Maximal Assistance - Patient 25 - 49%    Putting on/Taking off footwear      Assist for footwear: Maximal Assistance - Patient 25 - 49%       Care Tool Toileting Toileting activity   Assist for toileting: Maximal Assistance - Patient 25 - 49%     Care Tool Bed Mobility Roll left and right activity        Sit to lying activity   Sit to lying assist level: Minimal Assistance - Patient > 75%    Lying to  sitting edge of bed activity   Lying to sitting edge of bed assist level: Moderate Assistance - Patient 50 - 74%     Care Tool Transfers Sit to stand transfer Sit to stand activity did not occur: Safety/medical concerns      Chair/bed transfer   Chair/bed transfer assist level: Moderate Assistance - Patient 50 - 74%     Toilet transfer   Assist Level: Moderate Assistance - Patient 50 - 74%     Care Tool Cognition Expression of Ideas and Wants Expression of Ideas and Wants: Without difficulty (complex and basic) - expresses complex messages without difficulty and with speech that is clear and easy to understand   Understanding Verbal and Non-Verbal Content Understanding Verbal and Non-Verbal Content: Understands (complex and basic) - clear comprehension without cues or repetitions   Memory/Recall Ability *first 3 days only Memory/Recall Ability *first 3 days only: Current season;Location of own room;Staff names and faces;That he or she is in a hospital/hospital unit    Refer to Care Plan for [place] 1 OT Short Term Goal 1 (Week 1): Pt will complete toileting steps with min A OT Short Term Goal 2 (Week 1): Pt will complete BSC transfer with min A of 1 OT Short Term Goal 3 (Week 1): Pt will complete sit<>stand with mod A of 1 in preparation for BADL tasks  Recommendations for other services: None    Skilled Therapeutic Intervention Session 1 Pt greeted semi-reclined in bed with spouse present and agreeable to OT eval and treatment session. Pt reported no pain. Pt completed bed mobility with supervision. OT obtained  larger wc, R LE amputee pad, and wide drop arm commode. He then needed mod A for lateral scoot over to drop arm wc. Pt brought to the sink and completed UB bathing/dressing and grooming tasks from wc. Attempted sit<>stand 2x at the sink, but pt unable to achieve enough hip extension to achieve full stand. Pt needed OT assist to thread pant legs, Then was able to do a wc pushiup and get hips off of chair enough for OT to assist with pulling pants up over hips. OT eval completed addressing rehab process, OT purpose, POC, ELOS, and goals. Pt reported feeling comfortable in wc and was left sitting up with chair alarm on, call bell in reach, spouse present, and needs met.    Session 2 Pt greeted supine in bed and agreeable to OT treatment session. Worked on bed mobility from flat surface with pt needing mod A to elevate trunk 2.2 L shoulder weakness. Pt completed lateral scoot over to wc with min A this time. OT educated on wc functions including maneuvering drop arm and wc brakes. Pt propelled wc to therapy apartment and practiced tub bench transfer with mod A. Pt then propelled to therapy gym and squat-pivot to mat with min A. LB there-ex with 3 sets of 10 seated knee flexion/extension and hip flexion on B LEs. OT educated on hip extension stretch laying supine. Pt then completed 10, 1/2 squats pushing up from mat and lifting bottom in preparation for standing. Pt transferred back to wc, propelled wc back to room and laterally scooted back to bed with min A. Pt left semi-reclined in bed with bed alarm on and needs met.   ADL ADL Eating: Set up Grooming: Supervision/safety Upper Body Bathing: Setup Lower Body Bathing: Moderate assistance Upper Body Dressing: Setup Lower Body Dressing: Maximal assistance Toileting: Moderate assistance Toilet Transfer: Moderate assistance  Tub/Shower Transfer: Moderate assistance Mobility  Bed Mobility Bed Mobility: Sit to Supine;Supine to Sit Supine to Sit: Moderate  Assistance - Patient 50-74% Sit to Supine: Minimal Assistance - Patient > 75%   Discharge Criteria: Patient will be discharged from OT if patient refuses treatment 3 consecutive times without medical reason, if treatment goals not met, if there is a change in medical status, if patient makes no progress towards goals or if patient is discharged from hospital.  The above assessment, treatment plan, treatment alternatives and goals were discussed and mutually agreed upon: by patient and by family  Valma Cava 09/04/2019, 2:17 PM

## 2019-09-04 NOTE — Plan of Care (Signed)
Problem: RH Floor Transfers Goal: LTG Patient will perform floor transfers w/assist (PT) Description: LTG: Patient will perform floor transfers with assistance (PT). Outcome: Not Applicable   Problem: RH Ambulation Goal: LTG Patient will ambulate in community environment (PT) Description: LTG: Patient will ambulate in community environment, # of feet with assistance (PT). Outcome: Not Applicable   Problem: RH Stairs Goal: LTG Patient will ambulate up and down stairs w/assist (PT) Description: LTG: Patient will ambulate up and down # of stairs with assistance (PT) Outcome: Not Applicable   Problem: RH Floor Transfers Goal: LTG Patient will perform floor transfers w/assist (PT) Description: LTG: Patient will perform floor transfers with assistance (PT). Outcome: Not Applicable   Problem: RH Ambulation Goal: LTG Patient will ambulate in community environment (PT) Description: LTG: Patient will ambulate in community environment, # of feet with assistance (PT). Outcome: Not Applicable   Problem: RH Stairs Goal: LTG Patient will ambulate up and down stairs w/assist (PT) Description: LTG: Patient will ambulate up and down # of stairs with assistance (PT) Outcome: Not Applicable   Problem: RH Car Transfers Goal: LTG Patient will perform car transfers with assist (PT) Description: LTG: Patient will perform car transfers with assistance (PT). Flowsheets (Taken 09/04/2019 1510) LTG: Pt will perform car transfers with assist:: Contact Guard/Touching assist   Problem: RH Furniture Transfers Goal: LTG Patient will perform furniture transfers w/assist (OT/PT) Description: LTG: Patient will perform furniture transfers  with assistance (OT/PT). Flowsheets (Taken 09/04/2019 1510) LTG: Pt will perform furniture transfers with assist:: Supervision/Verbal cueing   Problem: RH Ambulation Goal: LTG Patient will ambulate in controlled environment (PT) Description: LTG: Patient will ambulate in a  controlled environment, # of feet with assistance (PT). Flowsheets (Taken 09/04/2019 1510) LTG: Pt will ambulate in controlled environ  assist needed:: Minimal Assistance - Patient > 75% LTG: Ambulation distance in controlled environment: 97ft with LRAD Goal: LTG Patient will ambulate in home environment (PT) Description: LTG: Patient will ambulate in home environment, # of feet with assistance (PT). Flowsheets (Taken 09/04/2019 1510) LTG: Pt will ambulate in home environ  assist needed:: Minimal Assistance - Patient > 75% LTG: Ambulation distance in home environment: 83ft with LRAD   Problem: RH Wheelchair Mobility Goal: LTG Patient will propel w/c in controlled environment (PT) Description: LTG: Patient will propel wheelchair in controlled environment, # of feet with assist (PT) Flowsheets (Taken 09/04/2019 1510) LTG: Pt will propel w/c in controlled environ  assist needed:: Independent with assistive device LTG: Propel w/c distance in controlled environment: 119ft Goal: LTG Patient will propel w/c in home environment (PT) Description: LTG: Patient will propel wheelchair in home environment, # of feet with assistance (PT). Flowsheets (Taken 09/04/2019 1510) LTG: Pt will propel w/c in home environ  assist needed:: Independent with assistive device Distance: wheelchair distance in controlled environment: 50 Goal: LTG Patient will propel w/c in community environment (PT) Description: LTG: Patient will propel wheelchair in community environment, # of feet with assist (PT) Flowsheets (Taken 09/04/2019 1510) LTG: Pt will propel w/c in community environ  assist needed:: Supervision/Verbal cueing Distance: wheelchair distance in controlled environment: 150   Problem: RH Balance Goal: LTG Patient will maintain dynamic sitting balance (PT) Description: LTG:  Patient will maintain dynamic sitting balance with assistance during mobility activities (PT) Flowsheets (Taken 09/04/2019 1510) LTG: Pt will  maintain dynamic sitting balance during mobility activities with:: Independent Goal: LTG Patient will maintain dynamic standing balance (PT) Description: LTG:  Patient will maintain dynamic standing balance with assistance during mobility  activities (PT) Flowsheets (Taken 09/04/2019 1510) LTG: Pt will maintain dynamic standing balance during mobility activities with:: Supervision/Verbal cueing   Problem: Sit to Stand Goal: LTG:  Patient will perform sit to stand with assistance level (PT) Description: LTG:  Patient will perform sit to stand with assistance level (PT) Flowsheets (Taken 09/04/2019 1510) LTG: PT will perform sit to stand in preparation for functional mobility with assistance level: Contact Guard/Touching assist   Problem: RH Bed Mobility Goal: LTG Patient will perform bed mobility with assist (PT) Description: LTG: Patient will perform bed mobility with assistance, with/without cues (PT). Flowsheets (Taken 09/04/2019 1510) LTG: Pt will perform bed mobility with assistance level of: Independent with assistive device    Problem: RH Bed to Chair Transfers Goal: LTG Patient will perform bed/chair transfers w/assist (PT) Description: LTG: Patient will perform bed to chair transfers with assistance (PT). Flowsheets (Taken 09/04/2019 1510) LTG: Pt will perform Bed to Chair Transfers with assistance level: Independent with assistive device

## 2019-09-04 NOTE — Progress Notes (Addendum)
[place] PHYSICAL MEDICINE & REHABILITATION PROGRESS NOTE   Subjective/Complaints: Tolerated therapy well today.  Has no complaints  ROS: denies pain  Objective:   No results found. Recent Labs    09/03/19 0554  WBC 9.4  HGB 11.6*  HCT 35.2*  PLT 356   Recent Labs    09/03/19 0554  NA 134*  K 4.3  CL 101  CO2 26  GLUCOSE 124*  BUN 11  CREATININE 0.66  CALCIUM 7.8*    Intake/Output Summary (Last 24 hours) at 09/04/2019 1502 Last data filed at 09/04/2019 0900 Gross per 24 hour  Intake 827.04 ml  Output 2350 ml  Net -1522.96 ml     Physical Exam: Vital Signs Blood pressure 125/67, pulse 71, temperature 98.1 F (36.7 C), temperature source Oral, resp. rate 18, height 6\' 3"  (1.905 m), weight 115.9 kg, SpO2 94 %.  General: Alert and oriented x 3, No apparent distress HEENT: Head is normocephalic, atraumatic, PERRLA, EOMI, sclera anicteric, oral mucosa pink and moist, dentition intact, ext ear canals clear,  Neck: Supple without JVD or lymphadenopathy Heart: Reg rate and rhythm. No murmurs rubs or gallops Chest: CTA bilaterally without wheezes, rales, or rhonchi; no distress Abdomen: Soft, non-tender, non-distended, bowel sounds positive. Musculoskeletal:  Cervical back: Normal range of motion.  Comments: Right leg tender with movement and touch Skin: General: Skin is warm.  Comments: Wound VAC in place to BKA. Appropriately tender Neurological:  General: No focal deficitpresent.  Mental Status: He is oriented to person, place, and time.  Cranial Nerves: No cranial nerve deficit.  Comments: Patient is alert in no acute distress. Follows commands. Oriented x3. Psychiatric:  Mood and Affect: Moodnormal.     Assessment/Plan: 1. 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  Physiatrist is providing close team supervision  and 24 hour management of active medical problems listed below.  Physiatrist and rehab team continue to assess barriers to discharge/monitor patient progress toward functional and medical goals  Care Tool:  Bathing    Body parts bathed by patient: Right arm, Left arm, Chest, Abdomen, Right upper leg, Left upper leg, Face   Body parts bathed by helper: Left lower leg, Buttocks, Front perineal area     Bathing assist Assist Level: Moderate Assistance - Patient 50 - 74%     Upper Body Dressing/Undressing Upper body dressing   What is the patient wearing?: Pull over shirt    Upper body assist Assist Level: Supervision/Verbal cueing    Lower Body Dressing/Undressing Lower body dressing      What is the patient wearing?: Pants, Underwear/pull up     Lower body assist Assist for lower body dressing: Maximal Assistance - Patient 25 - 49%     Toileting Toileting    Toileting assist Assist for toileting: Maximal Assistance - Patient 25 - 49% Assistive Device Comment: urinal   Transfers Chair/bed transfer  Transfers assist     Chair/bed transfer assist level: Moderate Assistance - Patient 50 - 74%     Locomotion Ambulation   Ambulation assist              Walk 10 feet activity   Assist           Walk 50 feet activity   Assist           Walk 150 feet activity   Assist  Walk 10 feet on uneven surface  activity   Assist           Wheelchair     Assist               Wheelchair 50 feet with 2 turns activity    Assist            Wheelchair 150 feet activity     Assist          Blood pressure 125/67, pulse 71, temperature 98.1 F (36.7 C), temperature source Oral, resp. rate 18, height 6\' 3"  (1.905 m), weight 115.9 kg, SpO2 94 %.    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 -patient maynot  yetshower -ELOS/Goals: 7-10 days, mod I to supervision  -Initial CIR evaluations today 2. Antithrombotics: -DVT/anticoagulation:Lovenox -antiplatelet therapy: Aspirin 81 mg daily 3. Pain Management:Tramadol 50 mg every 6 hours, oxycodone and Robaxin as needed. Well controlled 4. Mood. Celexa 20 mg daily 5. Neuropsychology. Patient is capable of making decisions on his own behalf 6. Skin/wound. Routine skin checks -wound vac for 7 days post-op 7. Fluids/electrolytes/nutrition. Strict I&O's with follow-up chemistriesordered 8. Acute blood loss anemia. Follow-up CBC on Monday 9. Diabetes mellitus with peripheral neuropathy. Hemoglobin A1c 6.6. SSI. Patient on Glucotrol 5 mg twice daily prior to admission as well as Glucophage 500 mg 1 tablet twice daily. Resume as needed. Well controlled.  10. Hypertension. Verapamil 240 mg daily. Well controlled -monitor variance with therapy/pain 11. Tobacco abuse. Counseling  LOS: 1 days A FACE TO FACE EVALUATION WAS PERFORMED  Thursday P Raeshaun Simson 09/04/2019, 3:02 PM

## 2019-09-04 NOTE — Plan of Care (Signed)
  Problem: RH Balance Goal: LTG: Patient will maintain dynamic sitting balance (OT) Description: LTG:  Patient will maintain dynamic sitting balance with assistance during activities of daily living (OT) Flowsheets (Taken 09/04/2019 1250) LTG: Pt will maintain dynamic sitting balance during ADLs with: Independent Goal: LTG Patient will maintain dynamic standing with ADLs (OT) Description: LTG:  Patient will maintain dynamic standing balance with assist during activities of daily living (OT)  Flowsheets (Taken 09/04/2019 1250) LTG: Pt will maintain dynamic standing balance during ADLs with: Supervision/Verbal cueing   Problem: Sit to Stand Goal: LTG:  Patient will perform sit to stand in prep for activites of daily living with assistance level (OT) Description: LTG:  Patient will perform sit to stand in prep for activites of daily living with assistance level (OT) Flowsheets (Taken 09/04/2019 1250) LTG: PT will perform sit to stand in prep for activites of daily living with assistance level: Supervision/Verbal cueing   Problem: RH Grooming Goal: LTG Patient will perform grooming w/assist,cues/equip (OT) Description: LTG: Patient will perform grooming with assist, with/without cues using equipment (OT) Flowsheets (Taken 09/04/2019 1250) LTG: Pt will perform grooming with assistance level of: Independent   Problem: RH Bathing Goal: LTG Patient will bathe all body parts with assist levels (OT) Description: LTG: Patient will bathe all body parts with assist levels (OT) Flowsheets (Taken 09/04/2019 1250) LTG: Pt will perform bathing with assistance level/cueing: Supervision/Verbal cueing   Problem: RH Dressing Goal: LTG Patient will perform upper body dressing (OT) Description: LTG Patient will perform upper body dressing with assist, with/without cues (OT). Flowsheets (Taken 09/04/2019 1250) LTG: Pt will perform upper body dressing with assistance level of: Independent Goal: LTG Patient will  perform lower body dressing w/assist (OT) Description: LTG: Patient will perform lower body dressing with assist, with/without cues in positioning using equipment (OT) Flowsheets (Taken 09/04/2019 1250) LTG: Pt will perform lower body dressing with assistance level of: Supervision/Verbal cueing   Problem: RH Toileting Goal: LTG Patient will perform toileting task (3/3 steps) with assistance level (OT) Description: LTG: Patient will perform toileting task (3/3 steps) with assistance level (OT)  Flowsheets (Taken 09/04/2019 1250) LTG: Pt will perform toileting task (3/3 steps) with assistance level: Supervision/Verbal cueing   Problem: RH Toilet Transfers Goal: LTG Patient will perform toilet transfers w/assist (OT) Description: LTG: Patient will perform toilet transfers with assist, with/without cues using equipment (OT) Flowsheets (Taken 09/04/2019 1250) LTG: Pt will perform toilet transfers with assistance level of: Supervision/Verbal cueing   Problem: RH Tub/Shower Transfers Goal: LTG Patient will perform tub/shower transfers w/assist (OT) Description: LTG: Patient will perform tub/shower transfers with assist, with/without cues using equipment (OT) Flowsheets (Taken 09/04/2019 1250) LTG: Pt will perform tub/shower stall transfers with assistance level of: Supervision/Verbal cueing

## 2019-09-05 LAB — GLUCOSE, CAPILLARY
Glucose-Capillary: 123 mg/dL — ABNORMAL HIGH (ref 70–99)
Glucose-Capillary: 188 mg/dL — ABNORMAL HIGH (ref 70–99)
Glucose-Capillary: 149 mg/dL — ABNORMAL HIGH (ref 70–99)
Glucose-Capillary: 174 mg/dL — ABNORMAL HIGH (ref 70–99)

## 2019-09-05 NOTE — Progress Notes (Signed)
Patient has no complaints of pain thru the day.  Refused scheduled tramadol.

## 2019-09-05 NOTE — Progress Notes (Signed)
[place] PHYSICAL MEDICINE & REHABILITATION PROGRESS NOTE   Subjective/Complaints: Frustrated with breakfast tray this morning.  Unhappy that his routine was changed last night and this morning- Edson Snowball talked with patient and now he is content. Discussed with patient that BP is slightly elevated and sugars well controlled  ROS: Denies pain  Objective:   No results found. Recent Labs    09/03/19 0554  WBC 9.4  HGB 11.6*  HCT 35.2*  PLT 356   Recent Labs    09/03/19 0554  NA 134*  K 4.3  CL 101  CO2 26  GLUCOSE 124*  BUN 11  CREATININE 0.66  CALCIUM 7.8*    Intake/Output Summary (Last 24 hours) at 09/05/2019 1025 Last data filed at 09/05/2019 1610 Gross per 24 hour  Intake 1010 ml  Output 4375 ml  Net -3365 ml     Physical Exam: Vital Signs Blood pressure (!) 145/84, pulse (!) 59, temperature 97.9 F (36.6 C), resp. rate 18, height 6\' 3"  (1.905 m), weight 115.9 kg, SpO2 93 %. General: Alert and oriented x 3, No apparent distress HEENT: Head is normocephalic, atraumatic, PERRLA, EOMI, sclera anicteric, oral mucosa pink and moist, dentition intact, ext ear canals clear,  Neck: Supple without JVD or lymphadenopathy Heart: Reg rate and rhythm. No murmurs rubs or gallops Chest: CTA bilaterally without wheezes, rales, or rhonchi; no distress Abdomen: Soft, non-tender, non-distended, bowel sounds positive. Musculoskeletal:  Cervical back: Normal range of motion.  Comments: Right leg tender with movement and touch Skin: General: Skin is warm.  Comments: Wound VAC in place to BKA. Appropriately tender Neurological:  General: No focal deficitpresent.  Mental Status: He is oriented to person, place, and time.  Cranial Nerves: No cranial nerve deficit.  Comments: Patient is alert in no acute distress. Follows commands. Oriented x3. Psychiatric:  Mood and Affect: Moodnormal.   Assessment/Plan: 1. Functional deficits  secondary to R BKA which require 3+ hours per day of interdisciplinary therapy in a comprehensive inpatient rehab setting.  Physiatrist is providing close team supervision and 24 hour management of active medical problems listed below.  Physiatrist and rehab team continue to assess barriers to discharge/monitor patient progress toward functional and medical goals  Care Tool:  Bathing    Body parts bathed by patient: Right arm, Left arm, Chest, Abdomen, Right upper leg, Left upper leg, Face   Body parts bathed by helper: Left lower leg, Buttocks, Front perineal area     Bathing assist Assist Level: Moderate Assistance - Patient 50 - 74%     Upper Body Dressing/Undressing Upper body dressing   What is the patient wearing?: Pull over shirt    Upper body assist Assist Level: Supervision/Verbal cueing    Lower Body Dressing/Undressing Lower body dressing      What is the patient wearing?: Pants, Underwear/pull up     Lower body assist Assist for lower body dressing: Maximal Assistance - Patient 25 - 49%     Toileting Toileting    Toileting assist Assist for toileting: Maximal Assistance - Patient 25 - 49% Assistive Device Comment: urinal   Transfers Chair/bed transfer  Transfers assist     Chair/bed transfer assist level: Moderate Assistance - Patient 50 - 74%     Locomotion Ambulation   Ambulation assist   Ambulation activity did not occur: Safety/medical concerns          Walk 10 feet activity   Assist  Walk 10 feet activity did not occur: Safety/medical concerns  Walk 50 feet activity   Assist Walk 50 feet with 2 turns activity did not occur: Safety/medical concerns         Walk 150 feet activity   Assist Walk 150 feet activity did not occur: Safety/medical concerns         Walk 10 feet on uneven surface  activity   Assist Walk 10 feet on uneven surfaces activity did not occur: Safety/medical concerns          Wheelchair     Assist   Type of Wheelchair: Manual    Wheelchair assist level: Supervision/Verbal cueing Max wheelchair distance: 150    Wheelchair 50 feet with 2 turns activity    Assist        Assist Level: Supervision/Verbal cueing   Wheelchair 150 feet activity     Assist      Assist Level: Supervision/Verbal cueing   Blood pressure (!) 145/84, pulse (!) 59, temperature 97.9 F (36.6 C), resp. rate 18, height 6\' 3"  (1.905 m), weight 115.9 kg, SpO2 93 %.    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 -patient maynot yetshower -ELOS/Goals: 7-10 days, mod I to supervision  -Continue CIR 2. Antithrombotics: -DVT/anticoagulation:Lovenox -antiplatelet therapy: Aspirin 81 mg daily 3. Pain Management:Tramadol 50 mg every 6 hours, oxycodone and Robaxin as needed. Well controlled 4. Mood. Celexa 20 mg daily 5. Neuropsychology. Patient is capable of making decisions on his own behalf 6. Skin/wound. Routine skin checks -wound vac for 7 days post-op 7. Fluids/electrolytes/nutrition. Strict I&O's with follow-up chemistriesordered 8. Acute blood loss anemia. Follow-up CBC on Monday 9. Diabetes mellitus with peripheral neuropathy. Hemoglobin A1c 6.6. SSI. Patient on Glucotrol 5 mg twice daily prior to admission as well as Glucophage 500 mg 1 tablet twice daily. Resume as needed. Well controlled.  10. Hypertension. Verapamil 240 mg daily. Slightly elevated- continue to monitor with increased mobility 11. Tobacco abuse. Counseling  LOS: 2 days A FACE TO FACE EVALUATION WAS PERFORMED  Thursday P Rayonna Heldman 09/05/2019, 10:25 AM

## 2019-09-05 NOTE — Progress Notes (Signed)
Pt expressed frustration with breakfast tray and refused to eat. Stated his routine was changed last night and this morning compared to last 9 days. Encouraged pt to share his concerns with the staff. Dietary called to bring coffee in mug.

## 2019-09-05 NOTE — Progress Notes (Signed)
Inpatient Rehabilitation  Patient information reviewed and entered into eRehab system by Norlene Lanes M. [place] Liz, M.A., CCC/SLP, PPS Coordinator.  Information including medical coding, functional ability and quality indicators will be reviewed and updated through discharge.    

## 2019-09-06 ENCOUNTER — Inpatient Hospital Stay (HOSPITAL_COMMUNITY): Payer: BC Managed Care – PPO | Admitting: Physical Therapy

## 2019-09-06 ENCOUNTER — Inpatient Hospital Stay (HOSPITAL_COMMUNITY): Payer: BC Managed Care – PPO | Admitting: Occupational Therapy

## 2019-09-06 DIAGNOSIS — E1169 Type 2 diabetes mellitus with other specified complication: Secondary | ICD-10-CM

## 2019-09-06 DIAGNOSIS — I10 Essential (primary) hypertension: Secondary | ICD-10-CM

## 2019-09-06 DIAGNOSIS — L03115 Cellulitis of right lower limb: Secondary | ICD-10-CM

## 2019-09-06 DIAGNOSIS — E669 Obesity, unspecified: Secondary | ICD-10-CM

## 2019-09-06 DIAGNOSIS — L02415 Cutaneous abscess of right lower limb: Secondary | ICD-10-CM

## 2019-09-06 LAB — COMPREHENSIVE METABOLIC PANEL
ALT: 34 U/L (ref 0–44)
AST: 46 U/L — ABNORMAL HIGH (ref 15–41)
Albumin: 2 g/dL — ABNORMAL LOW (ref 3.5–5.0)
Alkaline Phosphatase: 77 U/L (ref 38–126)
Anion gap: 10 (ref 5–15)
BUN: 10 mg/dL (ref 6–20)
CO2: 25 mmol/L (ref 22–32)
Calcium: 8 mg/dL — ABNORMAL LOW (ref 8.9–10.3)
Chloride: 101 mmol/L (ref 98–111)
Creatinine, Ser: 0.83 mg/dL (ref 0.61–1.24)
GFR calc Af Amer: >60 mL/min (ref >60)
GFR calc non Af Amer: >60 mL/min (ref >60)
Glucose, Bld: 169 mg/dL — ABNORMAL HIGH (ref 70–99)
Potassium: 4 mmol/L (ref 3.5–5.1)
Sodium: 136 mmol/L (ref 135–145)
Total Bilirubin: 0.6 mg/dL (ref 0.3–1.2)
Total Protein: 6 g/dL — ABNORMAL LOW (ref 6.5–8.1)

## 2019-09-06 LAB — CBC WITH DIFFERENTIAL/PLATELET
Abs Immature Granulocytes: 0.11 10*3/uL — ABNORMAL HIGH (ref 0.00–0.07)
Basophils Absolute: 0.1 10*3/uL (ref 0.0–0.1)
Basophils Relative: 1 %
Eosinophils Absolute: 0.1 10*3/uL (ref 0.0–0.5)
Eosinophils Relative: 1 %
HCT: 36.5 % — ABNORMAL LOW (ref 39.0–52.0)
Hemoglobin: 11.8 g/dL — ABNORMAL LOW (ref 13.0–17.0)
Immature Granulocytes: 1 %
Lymphocytes Relative: 30 %
Lymphs Abs: 3.1 10*3/uL (ref 0.7–4.0)
MCH: 31 pg (ref 26.0–34.0)
MCHC: 32.3 g/dL (ref 30.0–36.0)
MCV: 95.8 fL (ref 80.0–100.0)
Monocytes Absolute: 0.8 10*3/uL (ref 0.1–1.0)
Monocytes Relative: 8 %
Neutro Abs: 6.1 10*3/uL (ref 1.7–7.7)
Neutrophils Relative %: 59 %
Platelets: 317 10*3/uL (ref 150–400)
RBC: 3.81 MIL/uL — ABNORMAL LOW (ref 4.22–5.81)
RDW: 14.7 % (ref 11.5–15.5)
WBC: 10.3 10*3/uL (ref 4.0–10.5)
nRBC: 0 % (ref 0.0–0.2)

## 2019-09-06 LAB — SURGICAL PATHOLOGY

## 2019-09-06 LAB — AEROBIC/ANAEROBIC CULTURE W GRAM STAIN (SURGICAL/DEEP WOUND): Culture: NO GROWTH

## 2019-09-06 LAB — GLUCOSE, CAPILLARY
Glucose-Capillary: 141 mg/dL — ABNORMAL HIGH (ref 70–99)
Glucose-Capillary: 131 mg/dL — ABNORMAL HIGH (ref 70–99)
Glucose-Capillary: 80 mg/dL (ref 70–99)
Glucose-Capillary: 155 mg/dL — ABNORMAL HIGH (ref 70–99)

## 2019-09-06 MED ORDER — GLIPIZIDE 5 MG PO TABS
5.0000 mg | ORAL_TABLET | Freq: Every day | ORAL | Status: DC
  Administered 2019-09-06 – 2019-09-09 (×4): 5 mg via ORAL
  Filled 2019-09-06 (×4): qty 1

## 2019-09-06 MED ORDER — LIVING WELL WITH DIABETES BOOK
Freq: Once | Status: AC
  Filled 2019-09-06: qty 1

## 2019-09-06 NOTE — Care Management (Signed)
Inpatient Rehabilitation Center Individual Statement of Services  Patient Name:  TORRIE LAFAVOR  Date:  09/06/2019  Welcome to the Inpatient Rehabilitation Center.  Our goal is to provide you with an individualized program based on your diagnosis and situation, designed to meet your specific needs.  With this comprehensive rehabilitation program, you will be expected to participate in at least 3 hours of rehabilitation therapies Monday-Friday, with modified therapy programming on the weekends.  Your rehabilitation program will include the following services:  Physical Therapy (PT), Occupational Therapy (OT), 24 hour per day rehabilitation nursing, Therapeutic Recreaction (TR), Psychology, Neuropsychology, Care Coordinator, Rehabilitation Medicine, Nutrition Services, Pharmacy Services and Other  Weekly team conferences will be held on Tuesdays to discuss your progress.  Your Inpatient Rehabilitation Care Coordinator will talk with you frequently to get your input and to update you on team discussions.  Team conferences with you and your family in attendance may also be held.  Expected length of stay: 10-14 days   Overall anticipated outcome: Supervision  Depending on your progress and recovery, your program may change. Your Inpatient Rehabilitation Care Coordinator will coordinate services and will keep you informed of any changes. Your Inpatient Rehabilitation Care Coordinator's name and contact numbers are listed  below.  The following services may also be recommended but are not provided by the Inpatient Rehabilitation Center:   Driving Evaluations  Home Health Rehabiltiation Services  Outpatient Rehabilitation Services  Vocational Rehabilitation   Arrangements will be made to provide these services after discharge if needed.  Arrangements include referral to agencies that provide these services.  Your insurance has been verified to be:  [employer]  Your primary doctor is:  Lucky Cowboy  Pertinent information will be shared with your doctor and your insurance company.  Inpatient Rehabilitation Care Coordinator:  Susie Cassette [phone] or [phone]  Information discussed with and copy given to patient by: Gretchen Short, 09/06/2019, 11:42 AM

## 2019-09-06 NOTE — Progress Notes (Signed)
Physical Therapy Session Note  Patient Details  Name: [name] MRN: [id number] Date of Birth: May 24, 1960  Today's Date: 09/06/2019 PT Individual Time: 0805-0900 and [id number] PT Individual Time Calculation (min): 55 min and 69 min  Short Term Goals: Week 1:  PT Short Term Goal 1 (Week 1): Pt will consisently perform bed mobility with supervision assist PT Short Term Goal 2 (Week 1): Pt will transfer to and from [facility] with CGA consistently PT Short Term Goal 3 (Week 1): Pt will propell WC with supervision assist >282ft PT Short Term Goal 4 (Week 1): Pt will initate gait training in parallel bars with mod assist up to 5 ft  Skilled Therapeutic Interventions/Progress Updates:  Treatment 1: Pt received in bed & agreeable to tx. Denies pain, reports need to have BM. Pt transfers supine<>sit with mod I with HOB slightly elevated for supine>sit & bed rails. Pt completes lateral scoot bed<>w/c with min assist with cuing for anterior weight shift. PT assists pt into bathroom & pt completes lateral scoot to toilet in same manner, but toilet>w/c with stand pivot & grab bar with min assist. Pt is able to transfer to standing with grab bar & CGA to pull pants below hips & requires assistance to pull pants above hips after toileting. Pt with continent BM on toilet & performs peri hygiene in standing with grab bar & min assist. Pt completes hand hygiene at sink from w/c level with set up assist. Discussed anticipated DME (w/c & RW) & provided pt with home measurement sheet to ensure w/c will fit in all areas of house but pt believes it will. Discussed car transfer with pt reporting he will use car vs elevated trucks. Pt propels w/c room<>dayroom with BUE & supervision; PT provides w/c gloves to prevent skin breakdown. Educated pt on w/c parts management & importance of maintaining R knee extension during session. At end of session pt left in bed with alarm set, call bell & all needs in reach.  Treatment 2: Pt  received in bed with wife present in room. Educated pt's wife Aggie Cosier) on anticipated DME and f/u HHPT. Pt transfers supine>sit with supervision with extra time to raise trunk to sitting. Pt completes lateral scoot bed>w/c with min assist with ongoing cuing for head/hips relationship. Pt propels w/c around unit with BUE & supervision and manages w/c parts with supervision. Sit<>stand in parallel bars with min assist & cuing for hand placement. PT provides demo & education re: using BUE to raise body upward to then advance LLE vs solely hopping on LLE. Pt attempts task but with significant difficulty 2/2 BUE weakness. Transitioned to sitting in w/c & pt performs overhead tricep press with 4# dumbbell with RUE with PT providing tactile cuing. Pt unable to perform this exercises overhead with LUE 2/2 decreased shoulder ROM so transitioned activity to tricep extensions with green theraband BUE with cuing for technique. Educated pt on pressure relief techniques & need to perform them every 30 minutes while sitting in w/c. Pt performs RLE long arc quads 2 sets x 20 reps for RLE strengthening. Back in room, pt transfers w/c<>toilet with min assist & grab bar. Pt manages clothing without assistance & pt with continent BM on toilet, performing peri hygiene in standing with min assist & grab bar. Pt completes hand hygiene at sink from w/c level with supervision and w/c>bed with lateral scoot min assist. Pt left sitting on EOB in care of nurse.  Educated pt on use of shrinker sock &  pt dons it at beginning of session. Pain: "just a little bit, nothing I can't handle, I'm good"  Therapy Documentation Precautions:  Precautions Precautions: Fall Precaution Comments:  n Restrictions Weight Bearing Restrictions: No RLE Weight Bearing: Non weight bearing    Therapy/Group: Individual Therapy  Sandi Mariscal 09/06/2019, 3:43 PM

## 2019-09-06 NOTE — Progress Notes (Signed)
[place] PHYSICAL MEDICINE & REHABILITATION PROGRESS NOTE   Subjective/Complaints: Up at sink with OT washing hair. Having mild phantom symptoms but overall doing ok.   ROS: Patient denies fever, rash, sore throat, blurred vision, nausea, vomiting, diarrhea, cough, shortness of breath or chest pain,  headache, or mood change.  Objective:   No results found. No results for input(s): WBC, HGB, HCT, PLT in the last 72 hours. No results for input(s): NA, K, CL, CO2, GLUCOSE, BUN, CREATININE, CALCIUM in the last 72 hours.  Intake/Output Summary (Last 24 hours) at 09/06/2019 1034 Last data filed at 09/06/2019 0800 Gross per 24 hour  Intake 880 ml  Output 3500 ml  Net -2620 ml     Physical Exam: Vital Signs Blood pressure (!) 155/81, pulse 67, temperature 97.8 F (36.6 C), resp. rate 17, height 6\' 3"  (1.905 m), weight 115.9 kg, SpO2 97 %. Constitutional: No distress . Vital signs reviewed. HEENT: EOMI, oral membranes moist Neck: supple Cardiovascular: RRR without murmur. No JVD    Respiratory/Chest: CTA Bilaterally without wheezes or rales. Normal effort    GI/Abdomen: BS +, non-tender, non-distended Ext: no clubbing, cyanosis, or edema Psych: pleasant and cooperative Musculoskeletal:  Cervical back: Normal range of motion.  Comments: Right leg edematous, tender. Skin: Skin:  is warm.   : Wound VAC in place to BKA. Appropriately tender, no drainage in can Neurological:  alert and oriented x 3. Normal CN exam, normal insight and awareness.  UE motor 5/5. RLE limited by pain. LLE 5/5     Assessment/Plan: 1. 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  Physiatrist is providing close team supervision and 24 hour management of active medical problems listed below.  Physiatrist and rehab team continue to assess barriers to discharge/monitor patient progress toward  functional and medical goals  Care Tool:  Bathing    Body parts bathed by patient: Right arm, Left arm, Chest, Abdomen, Right upper leg, Left upper leg, Face   Body parts bathed by helper: Left lower leg, Buttocks, Front perineal area     Bathing assist Assist Level: Moderate Assistance - Patient 50 - 74%     Upper Body Dressing/Undressing Upper body dressing   What is the patient wearing?: Pull over shirt    Upper body assist Assist Level: Supervision/Verbal cueing    Lower Body Dressing/Undressing Lower body dressing      What is the patient wearing?: Pants, Underwear/pull up     Lower body assist Assist for lower body dressing: Maximal Assistance - Patient 25 - 49%     Toileting Toileting    Toileting assist Assist for toileting: Independent Assistive Device Comment: urinal   Transfers Chair/bed transfer  Transfers assist     Chair/bed transfer assist level: Minimal Assistance - Patient > 75%     Locomotion Ambulation   Ambulation assist   Ambulation activity did not occur: Safety/medical concerns          Walk 10 feet activity   Assist  Walk 10 feet activity did not occur: Safety/medical concerns        Walk 50 feet activity   Assist Walk 50 feet with 2 turns activity did not occur: Safety/medical concerns         Walk 150 feet activity   Assist Walk 150 feet activity did not occur: Safety/medical concerns         Walk 10 feet on uneven surface  activity   Assist Walk 10 feet on uneven surfaces activity did not occur: Safety/medical concerns         Wheelchair     Assist Will patient use wheelchair at discharge?: Yes Type of Wheelchair: Manual    Wheelchair assist level: Supervision/Verbal cueing Max wheelchair distance: 150    Wheelchair 50 feet with 2 turns activity    Assist        Assist Level: Supervision/Verbal cueing   Wheelchair 150 feet activity     Assist      Assist Level:  Supervision/Verbal cueing   Blood pressure (!) 155/81, pulse 67, temperature 97.8 F (36.6 C), resp. rate 17, height 6\' 3"  (1.905 m), weight 115.9 kg, SpO2 97 %.    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 -patient maynot yetshower until vac off -ELOS/Goals: 7-10 days, mod I to supervision  -Continue CIR 2. Antithrombotics: -DVT/anticoagulation:Lovenox -antiplatelet therapy: Aspirin 81 mg daily 3. Pain Management:Tramadol 50 mg every 6 hours, oxycodone and Robaxin as needed. Well controlled 4. Mood. Celexa 20 mg daily 5. Neuropsychology. Patient is capable of making decisions on his own behalf 6. Skin/wound. Routine skin checks -wound vac for 7 days post-op--remove tomorrow  -second wound cx still pending. Continue ctx and rocephin 7. Fluids/electrolytes/nutrition. recent labs ok  -good po intake 8. Acute blood loss anemia. follow up labs pending 9. Diabetes mellitus with peripheral neuropathy. Hemoglobin A1c 6.6. SSI. Patient on Glucotrol 5 mg twice daily prior to admission as well as Glucophage 500 mg 1 tablet twice daily.   8/16 resume low dose glucotrol 5mg  daiily 10. Hypertension. Verapamil 240 mg daily. Slightly elevated- continue to monitor with increased mobility  -borderline control, pain component 11. Tobacco abuse. Counseling  LOS: 3 days A FACE TO FACE EVALUATION WAS PERFORMED  9/16 09/06/2019, 10:34 AM

## 2019-09-06 NOTE — Progress Notes (Signed)
Patient Details  Name: [name] MRN: [id number] Date of Birth: 06-17-1960  Today's Date: 09/06/2019  Hospital Problems: Principal Problem:   Right below-knee amputee [facility])  Past Medical History:  Past Medical History:  Diagnosis Date  . Diabetic neuropathy ([place])   . Diverticulitis   . History of hepatitis C    has been treated in the past  . History of kidney stones   . Hyperlipidemia   . Hypertension   . Other testicular hypofunction   . Type II or unspecified type diabetes mellitus without mention of complication, not stated as uncontrolled   . Vitamin D deficiency    Past Surgical History:  Past Surgical History:  Procedure Laterality Date  . AMPUTATION Right 09/01/2019   Procedure: RIGHT BELOW KNEE AMPUTATION;  Surgeon: Newt Minion, MD;  Location: [place];  Service: Orthopedics;  Laterality: Right;  . CHOLECYSTECTOMY  1987  . COLONOSCOPY    . I & D EXTREMITY Right 08/20/2019   Procedure: IRRIGATION & DEBRIDEMENT RIGHT FOOT PARTIAL CALCANEAL EXCISION;  Surgeon: Newt Minion, MD;  Location: [place];  Service: Orthopedics;  Laterality: Right;  . I & D EXTREMITY Right 08/27/2019   Procedure: IRRIGATION AND DEBRIDEMENT  OF FOOT;  Surgeon: Leandrew Koyanagi, MD;  Location: [place];  Service: Orthopedics;  Laterality: Right;  . ORIF TIBIA FRACTURE     x 3 right leg   Social History:  reports that he has been smoking cigarettes. He has been smoking about 1.00 pack per day. He has never used smokeless tobacco. He reports current drug use. Drug: Marijuana. He reports that he does not drink alcohol.  Family / Support Systems Marital Status: Married How Long?: 5 years Patient Roles: Spouse Spouse/Significant Other: [name] [phone]) Children: 1 adult child Other Supports: friends Anticipated Caregiver: Wife Ability/Limitations of Caregiver: Wife works, however, intends to take [employer] Caregiver Availability: 24/7 Family Dynamics: Pt lives with his wife [name]. He works as a  Therapist, nutritional parts.  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 Pt has completed FMLA, STD/LTD with employer already. Legal History/Current Legal Issues: Denies Guardian/Conservator: N/A   Abuse/Neglect Abuse/Neglect Assessment Can Be Completed: Yes Physical Abuse: Denies Verbal Abuse: Denies Sexual Abuse: Denies Exploitation of patient/patient's resources: Denies Self-Neglect: Denies  Emotional Status Pt's affect, behavior and adjustment status: Pt in good spirits at time of visit. Recent Psychosocial Issues: Denies Psychiatric History: Denies Substance Abuse History: Denies. Pt admits that he has been smoking cigarettes for 45 yrs, and has recently stopped at time of admission (11 days no use). Pt reports his plan is to not return. Pt denies EtoH. Pt admits to occassional marijuana use.  Patient / Family Perceptions, Expectations & Goals Pt/Family understanding of illness & functional limitations: Pt and wife have general understanding of care needs at discharge Premorbid pt/family roles/activities: Independent Anticipated changes in roles/activities/participation: Assistance with ADLs/IADLs  Community Resources Express Scripts: None Premorbid Home Care/DME Agencies: None Transportation available at discharge: Wife Resource referrals recommended: Neuropsychology  Discharge Planning Living Arrangements: Spouse/significant other, Non-relatives/Friends, Children Support Systems: Spouse/significant other, Children, Other relatives, Friends/neighbors Type of Residence: Private residence Insurance Resources: Multimedia programmer (specify) Nurse, mental health) Financial Resources:  Employment Financial Screen Referred: No  Living Expenses: Own Money Management: Patient, Spouse Does the patient have any problems obtaining your medications?: No Home Management: Both manage finances, housekeeping, yard work, etc/. Patient/Family Preliminary Plans: Pt has assistance from friends who are helping with yardwork during his recovery Care Coordinator Anticipated Follow Up Needs: HH/OP Expected length of stay: 10-14 days  Clinical Impression SW met with pt and pt wife in room to introduce self, explain role, and discuss discharge process. Pt has no HCPOA. Not a veteran. DME: cane and crutches. Pt wife to explore if they have a TTB or can get access to one. SW informed will continue to provide updates on DME needs. SW informed will order: w/c and RW (explained RW will be private pay). SW to follow up after team conference.   Auria A Chamberlain 09/06/2019, 3:39 PM   

## 2019-09-06 NOTE — Progress Notes (Signed)
Occupational Therapy Session Note  Patient Details  Name: [name] MRN: [id number] Date of Birth: Oct 02, 1960  Today's Date: 09/06/2019 OT Individual Time: 1000-1100 OT Individual Time Calculation (min): 60 min    Short Term Goals: Week 1:  OT Short Term Goal 1 (Week 1): Pt will complete toileting steps with min A OT Short Term Goal 2 (Week 1): Pt will complete BSC transfer with min A of 1 OT Short Term Goal 3 (Week 1): Pt will complete sit<>stand with mod A of 1 in preparation for BADL tasks  Skilled Therapeutic Interventions/Progress Updates:    patient, alert and ready for therapy session.   In bed, states that pain is under control at this time.   Supine to sitting edge of bed with CS.  Sit pivot transfer bed to w/c with CGA.  Completed hair washing using shampoo tray with max A.   Grooming and UB bathing with set up.  He is able to propel w/c to/from therapy gym,  Completed green theraband with focus on scap/shoulder ext.  UB ergometer on level 2, 2 x 4 minutes.  Returned to bed at close of session, sit pivot transfer w/c to bed CGA.  Bed alarm set and call bell in reach.    Therapy Documentation Precautions:  Precautions Precautions: Fall Precaution Comments:  n Restrictions Weight Bearing Restrictions: No RLE Weight Bearing: Non weight bearing  Therapy/Group: Individual Therapy  Barrie Lyme 09/06/2019, 7:38 AM

## 2019-09-06 NOTE — IPOC Note (Signed)
Overall Plan of Care [facility]) Patient Details Name: [name] MRN: [id number] DOB: 14-Nov-1960  Admitting Diagnosis: Right below-knee amputee [facility])  Hospital Problems: Principal Problem:   Right below-knee amputee [facility])     Functional Problem List: Nursing Skin Integrity, Sensory, Safety, Pain, Nutrition, Medication Management, Motor, Endurance, Edema  PT Safety, Balance, Edema, Endurance, Motor, Pain, Sensory, Skin Integrity  OT Balance, Endurance, Motor, Pain  SLP    TR         Basic ADL's: OT Grooming, Bathing, Dressing, Toileting     Advanced  ADL's: OT       Transfers: PT Bed Mobility, Bed to Chair, Car, Furniture, Floor  OT Tub/Shower, Technical brewer: PT Ambulation, Psychologist, prison and probation services, Stairs     Additional Impairments: OT None  SLP        TR      Anticipated Outcomes Item Anticipated Outcome  Self Feeding Independent  Swallowing      Basic self-care  Supervision/mod I  Toileting  supervision   Bathroom Transfers supervision  Bowel/Bladder  Remain continent, manage w/ min assisst  Transfers  Mod I with LRAD  Locomotion  Mod I WC mobility. Min assist gait for short distances with LRAD  Communication     Cognition     Pain  C/O [name] pain, pain less than 5  Safety/Judgment  Adhere to floor safety plan, no falls during admission   Therapy Plan: PT Intensity: Minimum of 1-2 x/day ,45 to 90 minutes PT Frequency: 5 out of 7 days PT Duration Estimated Length of Stay: 10-14 days OT Intensity: Minimum of 1-2 x/day, 45 to 90 minutes OT Frequency: 5 out of 7 days OT Duration/Estimated Length of Stay: 12-14 days     Due to the current state of emergency, patients may not be receiving their 3-hours of Medicare-mandated therapy.   Team Interventions: Nursing Interventions Patient/Family Education, Disease Management/Prevention, Pain Management, Medication Management, Skin Care/Wound Management, Discharge Planning, Psychosocial  Support  PT interventions Ambulation/gait training, Community reintegration, DME/adaptive equipment instruction, Neuromuscular re-education, Psychosocial support, Stair training, Wheelchair propulsion/positioning, UE/LE Strength taining/ROM, UE/LE Coordination activities, Skin care/wound management, Therapeutic Activities, Pain management, Discharge planning, Warden/ranger, Cognitive remediation/compensation, Disease management/prevention, Functional mobility training, Patient/family education, Splinting/orthotics, Therapeutic Exercise, Visual/perceptual remediation/compensation  OT Interventions Warden/ranger, Community reintegration, Discharge planning, Disease mangement/prevention, Fish farm manager, Functional mobility training, Pain management, Patient/family education, Psychosocial support, Self Care/advanced ADL retraining, Skin care/wound managment, Therapeutic Activities, Therapeutic Exercise, UE/LE Strength taining/ROM, UE/LE Coordination activities, Wheelchair propulsion/positioning  SLP Interventions    TR Interventions    SW/CM Interventions Psychosocial Support, Patient/Family Education, Discharge Planning   Barriers to Discharge MD  Medical stability  Nursing Decreased caregiver support, Home environment access/layout, IV antibiotics, Wound Care, Lack of/limited family support, Weight, Weight bearing restrictions    PT Inaccessible home environment, Home environment access/layout, IV antibiotics, Wound Care, Insurance for SNF coverage, Weight, Weight bearing restrictions    OT      SLP      SW       Team Discharge Planning: Destination: PT-Home ,OT- Home , SLP-  Projected Follow-up: PT-Home health PT, OT-  Home health OT, SLP-  Projected Equipment Needs: PT-Wheelchair (measurements), Wheelchair cushion (measurements), Rolling walker with 5" wheels, OT- To be determined, SLP-  Equipment Details: PT- , OT-Pt will likely need drop arm  BSC and tub transfer bench Patient/family involved in discharge planning: PT- Patient,  OT-Patient, SLP-   MD ELOS: 10-14 days Medical Rehab Prognosis:  Excellent Assessment: The patient has been admitted for CIR therapies with the diagnosis of [name]. The team will be addressing functional mobility, strength, stamina, balance, safety, adaptive techniques and equipment, self-care, bowel and bladder mgt, patient and caregiver education, pain mgt, pre-prosthetic education, wound care. Goals have been set at mod I at w/c level for mobility and self-care.   Due to the current state of emergency, patients may not be receiving their 3 hours per day of Medicare-mandated therapy.    Ranelle Oyster, MD, FAAPMR      See Team Conference Notes for weekly updates to the plan of care

## 2019-09-07 ENCOUNTER — Inpatient Hospital Stay (HOSPITAL_COMMUNITY): Payer: BC Managed Care – PPO | Admitting: Occupational Therapy

## 2019-09-07 ENCOUNTER — Inpatient Hospital Stay (HOSPITAL_COMMUNITY): Payer: BC Managed Care – PPO | Admitting: Physical Therapy

## 2019-09-07 LAB — GLUCOSE, CAPILLARY
Glucose-Capillary: 117 mg/dL — ABNORMAL HIGH (ref 70–99)
Glucose-Capillary: 170 mg/dL — ABNORMAL HIGH (ref 70–99)
Glucose-Capillary: 100 mg/dL — ABNORMAL HIGH (ref 70–99)
Glucose-Capillary: 236 mg/dL — ABNORMAL HIGH (ref 70–99)

## 2019-09-07 MED ORDER — CEFDINIR 300 MG PO CAPS
300.0000 mg | ORAL_CAPSULE | Freq: Two times a day (BID) | ORAL | Status: DC
  Administered 2019-09-07 – 2019-09-15 (×17): 300 mg via ORAL
  Filled 2019-09-07 (×18): qty 1

## 2019-09-07 NOTE — Progress Notes (Signed)
Physical Therapy Note  Patient Details  Name: DEMONTRE PADIN MRN: [id number] Date of Birth: February 06, 1960 Today's Date: 09/07/2019    PT received pt in room with wife present for therapy session. Pt's wife with mask below chin & nose, consuming beverage in room. PT educates her on mask policy & need to eat/drink outside or in cafeteria. Pt's wife dons mask properly & voices understanding of policy.  Sandi Mariscal 09/07/2019, 10:35 AM

## 2019-09-07 NOTE — Progress Notes (Addendum)
Patient ID: [name], male   DOB: 1960/02/20, 59 y.o.   MRN: [id number]  SW met with pt and pt wife in room to provide updates from team conference, and d/c date 09/15/2019. SW provided d/c recommendations: w/c, RW, TTB, and DABSC, and HHPT/OT. SW to order DME, and wife will inform SW on RW and DABSC is needed.  SW provided HHA list (via [email or web address]), and will follow-up about preference.   SW ordered DME: w/c, RW, and DABSC with [place] via parachute.   Loralee Pacas, MSW, [place] Office: [phone] Cell: [phone] Fax: [phone]

## 2019-09-07 NOTE — Progress Notes (Addendum)
Physical Therapy Session Note  Patient Details  Name: [name] MRN: [id number] Date of Birth: 09/24/1960  Today's Date: 09/07/2019 PT Individual Time: 0932-1027 PT Individual Time Calculation (min): 55 min   Short Term Goals: Week 1:  PT Short Term Goal 1 (Week 1): Pt will consisently perform bed mobility with supervision assist PT Short Term Goal 2 (Week 1): Pt will transfer to and from [facility] with CGA consistently PT Short Term Goal 3 (Week 1): Pt will propell WC with supervision assist >2101ft PT Short Term Goal 4 (Week 1): Pt will initate gait training in parallel bars with mod assist up to 5 ft  Skilled Therapeutic Interventions/Progress Updates:  Pt received in w/c with nurse & wife in room. Nurse removing wound vac during session & PT educates pt & wife on need to use incentive spirometer, pursed lip breathing techniques, f/u therapy & prosthetic process while nurse cares for pt. Also discussed home set up with wife providing home measurement sheet & w/c should fit in all doorways & per pt & wife pt should be able to propel w/c right up to toilet - PT educates them on ability to use [facility] if for some reason pt cannot access toilet, and anticipate pt d/c at w/c level vs ambulation at this time. Pt performs BUE tricep exercises with green theraband, RUE bicep curls with 5# dumbbell (unable to with LLE 2/2 old shoulder injury), PNF diagonals with 4 kg (2.2#) weighted ball for strengthening. At end of session pt left in w/c with chair alarm donned & call bell in reach, wife present in room.  Addendum: PT ace wrapped RLE & educated pt on need for figure 8 vs circumferential wrapping.   Therapy Documentation Precautions:  Precautions Precautions: Fall Precaution Comments:  n Restrictions Weight Bearing Restrictions: No RLE Weight Bearing: Non weight bearing  Pain: Pt in pain when nurse removes wound vac, rest breaks provided PRN & pt reports he's premedicated   Therapy/Group:  Individual Therapy  Sandi Mariscal 09/07/2019, 10:52 AM

## 2019-09-07 NOTE — Progress Notes (Signed)
Pt wound vac removed per order. Adhesive remover used and sponge and dressing removed. Incision approximated with staples, CDI No redness noted. Dry dressing placed per order.

## 2019-09-07 NOTE — Progress Notes (Signed)
[place] PHYSICAL MEDICINE & REHABILITATION PROGRESS NOTE   Subjective/Complaints: Had a good night. Progressing with therapies  ROS: Patient denies fever, rash, sore throat, blurred vision, nausea, vomiting, diarrhea, cough, shortness of breath or chest pain,  headache, or mood change.    Objective:   No results found. Recent Labs    09/06/19 1413  WBC 10.3  HGB 11.8*  HCT 36.5*  PLT 317   Recent Labs    09/06/19 1413  NA 136  K 4.0  CL 101  CO2 25  GLUCOSE 169*  BUN 10  CREATININE 0.83  CALCIUM 8.0*    Intake/Output Summary (Last 24 hours) at 09/07/2019 0911 Last data filed at 09/07/2019 0851 Gross per 24 hour  Intake 962 ml  Output 2201 ml  Net -1239 ml     Physical Exam: Vital Signs Blood pressure (!) 143/74, pulse 60, temperature 97.8 F (36.6 C), resp. rate 17, height 6\' 3"  (1.905 m), weight 115.9 kg, SpO2 96 %. Constitutional: No distress . Vital signs reviewed. HEENT: EOMI, oral membranes moist Neck: supple Cardiovascular: RRR without murmur. No JVD    Respiratory/Chest: CTA Bilaterally without wheezes or rales. Normal effort    GI/Abdomen: BS +, non-tender, non-distended Ext: no clubbing, cyanosis, or edema Psych: pleasant and cooperative Musculoskeletal:  Cervical back: Normal range of motion.  Comments: Right leg less edematous, tender.   Skin:  is warm.   : Wound VAC in place to BKA. shrinker in place Neurological:  alert and oriented x 3. Normal CN exam, normal insight and awareness.  UE motor 5/5. RLE limited by pain. LLE 5/5     Assessment/Plan: 1. 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  Physiatrist is providing close team supervision and 24 hour management of active medical problems listed below.  Physiatrist and rehab team continue to assess barriers to discharge/monitor patient progress toward functional and medical  goals  Care Tool:  Bathing    Body parts bathed by patient: Right arm, Left arm, Chest, Abdomen, Right upper leg, Left upper leg, Face   Body parts bathed by helper: Left lower leg, Buttocks, Front perineal area     Bathing assist Assist Level: Moderate Assistance - Patient 50 - 74%     Upper Body Dressing/Undressing Upper body dressing   What is the patient wearing?: Pull over shirt    Upper body assist Assist Level: Supervision/Verbal cueing    Lower Body Dressing/Undressing Lower body dressing      What is the patient wearing?: Pants, Underwear/pull up     Lower body assist Assist for lower body dressing: Maximal Assistance - Patient 25 - 49%     Toileting Toileting    Toileting assist Assist for toileting: Independent Assistive Device Comment: urinal   Transfers Chair/bed transfer  Transfers assist     Chair/bed transfer assist level: Minimal Assistance - Patient > 75%     Locomotion Ambulation   Ambulation assist   Ambulation activity did not occur: Safety/medical concerns          Walk 10 feet activity   Assist  Walk 10 feet activity did not occur: Safety/medical concerns        Walk 50 feet activity   Assist Walk 50 feet with 2 turns activity did not occur: Safety/medical concerns         Walk 150 feet activity   Assist Walk 150 feet activity did not occur:  Safety/medical concerns         Walk 10 feet on uneven surface  activity   Assist Walk 10 feet on uneven surfaces activity did not occur: Safety/medical concerns         Wheelchair     Assist Will patient use wheelchair at discharge?: Yes Type of Wheelchair: Manual    Wheelchair assist level: Supervision/Verbal cueing Max wheelchair distance: 150    Wheelchair 50 feet with 2 turns activity    Assist        Assist Level: Supervision/Verbal cueing   Wheelchair 150 feet activity     Assist      Assist Level: Supervision/Verbal cueing    Blood pressure (!) 143/74, pulse 60, temperature 97.8 F (36.6 C), resp. rate 17, height 6\' 3"  (1.905 m), weight 115.9 kg, SpO2 96 %.    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 -patient maynot yetshower until vac off -ELOS/Goals: 7-10 days, mod I to supervision  -Continue CIR 2. Antithrombotics: -DVT/anticoagulation:Lovenox -antiplatelet therapy: Aspirin 81 mg daily 3. Pain Management:Tramadol 50 mg every 6 hours, oxycodone and Robaxin as needed. Well controlled 4. Mood. Celexa 20 mg daily 5. Neuropsychology. Patient is capable of making decisions on his own behalf 6. Skin/wound. Routine skin checks 8/17-wound vac for 7 days post-op--remove today---dry dressing with ACE       -second wound cx neg. First with strep   -dc ceftriaxone and flagyl today--> cefdinir oral 300mg  bid 7. Fluids/electrolytes/nutrition. recent labs ok  -  po intake solid 8. Acute blood loss anemia. follow up labs pending 9. Diabetes mellitus with peripheral neuropathy. Hemoglobin A1c 6.6. SSI. Patient on Glucotrol 5 mg twice daily prior to admission as well as Glucophage 500 mg 1 tablet twice daily.   8/16 resumed low dose glucotrol 5mg  daiily 10. Hypertension. Verapamil 240 mg daily. Slightly elevated- continue to monitor with increased mobility  -borderline to reasonable control, pain component 11. Tobacco abuse. Counseling  LOS: 4 days A FACE TO FACE EVALUATION WAS PERFORMED  10/31/2019 09/07/2019, 9:11 AM

## 2019-09-07 NOTE — Patient Care Conference (Signed)
Inpatient RehabilitationTeam Conference and Plan of Care Update Date: 09/07/2019   Time: 10:51 AM    Patient Name: [name]      Medical Record Number: [id number]  Date of Birth: 10-15-1960 Sex: Male         Room/Bed: 4M10C/4M10C-01 Payor Info: Payor: BLUE CROSS BLUE SHIELD / Plan: BCBS COMM PPO / Product Type: *No Product type* /    Admit Date/Time:  09/03/2019  6:40 PM  Primary Diagnosis:  Right below-knee amputee [facility])  Hospital Problems: Principal Problem:   Right below-knee amputee [facility])    Expected Discharge Date: Expected Discharge Date: 09/15/19  Team Members Present: Physician leading conference: Dr. Faith Rogue Care Coodinator Present: Cecile Sheerer, LCSWA;Daisy Lites Marlyne Beards, RN, BSN, CRRN Nurse Present: Other (comment) Tinnie Gens, RN) PT Present: Aleda Grana, PT OT Present: Kearney Hard, OT SLP Present: Feliberto Gottron, SLP PPS Coordinator present : Edson Snowball, Park Breed, SLP     Current Status/Progress Goal Weekly Team Focus  Bowel/Bladder   Patient is continent of B/B.  Urinal use with measured output.  Patient will maintain GI/GU continence.  LBM 09/06/19.  Assess GI/GU function Qshift and PRN.  Encourage hydration, nutrition and activity.   Swallow/Nutrition/ Hydration             ADL's   Min A overall, Max A to stand  Supervision/mod i  transfers, sit<>stands, self-care retraining, activity tolerance   Mobility   mod I bed mobility, min assist lateral scoot, supervision w/c mobility  supervision<>Mod I w/c mobility, min assist short distance gait with LRAD, CGA car transfer, supervision<>Mod I transfers  transfers, bed mobility, w/c mobility, gait as able, d/c planning, pt education   Communication             Safety/Cognition/ Behavioral Observations            Pain   Patient with occasional c/o surgical pain s/p [name].  Well managed with scheduled Tramadol Q12H  Patient will maintain painl level less than 4 with or  without increased activity tolerance  Assess pain Qshift and PRN.  Non-pharm interventions in addition to pharmacological.   Skin   Patient has [name] stump with NPWT continuous at 125 mm/Hg.  Output of zero recorded. Skin otherwise intact with small scabbed lesions to left foot and ankle.  Patient will be free of further wounds/breakdown or infection.  Assess and monitor negative pressure wound therapy Qshift and PRN.  Change dressing and measure wound per protocol.  Promote hydration, diet and activity for wound healing.     Discharge Planning:  D/c to home with 24/7 care from wife; and support from friends if needed.   Team Discussion: Progressing well. Wound vac to be dc'd today. Stand pivot transfers progressing well with supervision to Mod I goals. Ambulation not progressing as well. Pt may discharge at a wheelchair level. Wife to assist at discharge. Patient on target to meet rehab goals: no, ambulation goals are not progressing well at this time. Patient could discharge at a wheelchair level.  *See Care Plan and progress notes for long and short-term goals.   Revisions to Treatment Plan:  None  Teaching Needs: Family education with the wife.  Current Barriers to Discharge: Inaccessible home environment, Home enviroment access/layout, Wound care and Weight bearing restrictions  Possible Resolutions to Barriers: Pt to possibly discharge at a wheelchair level, nursing to teach wife daily dressing changes, educate pt on weight bearing precautions.     Medical Summary Current Status:  [name], vac off today. pain control improving. sugars up after surgery  Barriers to Discharge: Medical stability;Wound care   Possible Resolutions to Barriers/Weekly Focus: daily dressing to right leg. pain control, pre-prosthetic education   Continued Need for Acute Rehabilitation Level of Care: The patient requires daily medical management by a physician with specialized training in physical  medicine and rehabilitation for the following reasons: Direction of a multidisciplinary physical rehabilitation program to maximize functional independence : Yes Medical management of patient stability for increased activity during participation in an intensive rehabilitation regime.: Yes Analysis of laboratory values and/or radiology reports with any subsequent need for medication adjustment and/or medical intervention. : Yes   I attest that I was present, lead the team conference, and concur with the assessment and plan of the team.   Tennis Must 09/07/2019, 1:28 PM

## 2019-09-07 NOTE — Plan of Care (Signed)
  Problem: Consults Goal: RH GENERAL PATIENT EDUCATION Description: See Patient Education module for education specifics. Outcome: Progressing Goal: Skin Care Protocol Initiated - if Braden Score 18 or less Description: If consults are not indicated, leave blank or document N/A Outcome: Progressing Goal: Nutrition Consult-if indicated Outcome: Progressing Goal: Diabetes Guidelines if Diabetic/Glucose > 140 Description: If diabetic or lab glucose is > 140 mg/dl - Initiate Diabetes/Hyperglycemia Guidelines & Document Interventions  Outcome: Progressing   Problem: RH BOWEL ELIMINATION Goal: RH STG MANAGE BOWEL WITH ASSISTANCE Description: STG Manage Bowel with Assistance. Outcome: Progressing Goal: RH STG MANAGE BOWEL W/MEDICATION W/ASSISTANCE Description: STG Manage Bowel with Medication with Assistance. Outcome: Progressing   Problem: RH BLADDER ELIMINATION Goal: RH STG MANAGE BLADDER WITH ASSISTANCE Description: STG Manage Bladder With Assistance Outcome: Progressing Goal: RH STG MANAGE BLADDER WITH MEDICATION WITH ASSISTANCE Description: STG Manage Bladder With Medication With Assistance. Outcome: Progressing Goal: RH STG MANAGE BLADDER WITH EQUIPMENT WITH ASSISTANCE Description: STG Manage Bladder With Equipment With Assistance Outcome: Progressing   

## 2019-09-07 NOTE — Progress Notes (Signed)
Occupational Therapy Session Note  Patient Details  Name: [name] MRN: [id number] Date of Birth: 05/31/1960  Today's Date: 09/07/2019 Session 1 OT Individual Time: 0734-0830 OT Individual Time Calculation (min): 56 min   Session 2 OT Individual Time: 1300-1415 OT Individual Time Calculation (min): 75 min    Short Term Goals: Week 1:  OT Short Term Goal 1 (Week 1): Pt will complete toileting steps with min A OT Short Term Goal 2 (Week 1): Pt will complete BSC transfer with min A of 1 OT Short Term Goal 3 (Week 1): Pt will complete sit<>stand with mod A of 1 in preparation for BADL tasks  Skilled Therapeutic Interventions/Progress Updates:  Session 1   Pt greeted semi-reclined in bed and reported need to go to the bathroom. Pt completed bed mobility without assistance, then performed squat-pivot from bed>drop arm wc with CGA. Pt able to propel wc into bathroom going backwards and line wc up appropriately to wide BSC placed over toilet. Pt performed squat-pivot over to commode with CGA. Pt was then able to do a partial stand to pull pants down with supervision and use of grab bars. Pt needed set-up A for hygiene, but was able to perform peri-care. Partial stand again to pull up pants. Pt brought to the sink for BADL tasks. UB bathing completed with set-up and min A for LB bathing with pt able to achieve partial stand for OT to assist with washing buttocks and pulling up pants. Pt propelled wc to therapy gym and completed wc obstacle course focus on weaving in and out of cones. Pt took rest break, then propelled wc back to room. Pt agreeable to stay up in wc at end of session and left with alarm belt on, call bell in reach, and needs met.  Session 2 Pt greeted semi-reclined in bed with spouse present and agreeable to OT treatment session. Pt reported wound vac and IV have been removed. Pt completed lateral scoot to wc with CGA. Pt then propelled wc to therapy gym. Pt completed 5  sit<>stands x2 in parallel bars with min A and rest breaks in between. Pt completed lateral scoot to therapy mat with supervision. Pt brought into supine for R residual limb there-ex. OT went through home exercise program 2 sets of 10. Glute squeezes, isometric hip extension, sidelying hip extension, straight leg raise, and sitting knee extension. 3 sit<>stands from raised therapy mat and RW with min A and education for hand placement and power up. Worked on heel toe slide along edge of mat to prepare for pivot to wc. Pt returned to sitting, then completed stand-pivot to wc on R side with RW and min A-more heel/toe scoot, rather than hop. UB there-ex with 5 mins x2 forward and back on UE Ergometer. Pt propelled wc back to room and completed lateral scoot back to bed with CGA. Pt left semi-reclined in bed with bed alarm on, call bell in reach, and needs met.   Therapy Documentation Precautions:  Precautions Precautions: Fall Precaution Comments:  n Restrictions Weight Bearing Restrictions: No RLE Weight Bearing: Non weight bearing Pain:  DENIES PAIN   Therapy/Group: Individual Therapy  Elisabeth S Doe 09/07/2019, 2:20 PM 

## 2019-09-08 ENCOUNTER — Encounter (HOSPITAL_COMMUNITY): Payer: BC Managed Care – PPO | Admitting: Psychology

## 2019-09-08 ENCOUNTER — Inpatient Hospital Stay (HOSPITAL_COMMUNITY): Payer: BC Managed Care – PPO

## 2019-09-08 ENCOUNTER — Inpatient Hospital Stay (HOSPITAL_COMMUNITY): Payer: BC Managed Care – PPO | Admitting: Physical Therapy

## 2019-09-08 LAB — GLUCOSE, CAPILLARY
Glucose-Capillary: 106 mg/dL — ABNORMAL HIGH (ref 70–99)
Glucose-Capillary: 107 mg/dL — ABNORMAL HIGH (ref 70–99)
Glucose-Capillary: 99 mg/dL (ref 70–99)
Glucose-Capillary: 199 mg/dL — ABNORMAL HIGH (ref 70–99)

## 2019-09-08 NOTE — Progress Notes (Signed)
Occupational Therapy Session Note  Patient Details  Name: [name] MRN: [id number] Date of Birth: 1960-02-27  Today's Date: 09/08/2019 OT Individual Time: 7253-6644 OT Individual Time Calculation (min): 52 min   Session 2: OT Individual Time: 1300-1400 OT Individual Time Calculation (min): 60 min    Short Term Goals: Week 1:  OT Short Term Goal 1 (Week 1): Pt will complete toileting steps with min A OT Short Term Goal 2 (Week 1): Pt will complete BSC transfer with min A of 1 OT Short Term Goal 3 (Week 1): Pt will complete sit<>stand with mod A of 1 in preparation for BADL tasks  Skilled Therapeutic Interventions/Progress Updates:    Session 1: Pt received in w/c with 4/10 pain in residual limb, no request for intervention and agreeable to OT session. Pt completed w/c propulsion with slow rate but (S) overall, 150 ft. Pt set up transfer to therapy mat with min cueing for moving R leg rest. Pt completed lateral scoot transfer with CGA. Pt transitioned to supine on mat. Pt completed 3x10 glute bridges with cueing for technique, performed to facilitate posterior chain strengthening. Pt completed 2x 10 repetitions of supine chest press with 6 lb dumbbells and tricep extensions. Tactile cueing provided for elbow stabilization to ensure proper tricep activation. Pt very motivated to participate, rest breaks provided throughout. Pt transitioned to prone on mat and completed R LE hip extension and abduction 2x10, performed to encourage proper knee extension and positioning. Pt completed 1x5 prone pushups with cueing for technique and motivation provided throughout- great challenge for pt. Pt returned to w/c via lateral scoot and propelled w/c back to the room with (S). Pt completed lateral scoot back to bed with (S). Pt left sitting EOB with his wife present and bed alarm set.    Session 2:  Pt received in w/c, 2/10 pain in residual limb and agreeable to session. No request for intervention for  pain. Pt agreeable to complete shower in tub room to maximize home simulation. Pt's residual limb was occluded to remain dry during shower. Pt set up transfer from w/c > TTB with no cueing necessary. He completed squat pivot transfer with CGA. Min cueing for safety when removing clothing for shower. Demonstrated TTB use and how to keep bathroom dry with pt and his wife. Pt completed all bathing from seated level with set up assist. Min cueing for safety during transfer from TTB > w/c. Pt donned shirt with mod I and pants with min A provided from wife. Pt propelled w/c to therapy gym with (S). In standing he completed RLE hip extension and abduction exercises 2x 10 repetitions. Pt with heavy use of grab bars and cueing required for body mechanics. Pt returned to his room and transferred back to bed. He was left EOB with his wife present.   Therapy Documentation Precautions:  Precautions Precautions: Fall Precaution Comments:  n Restrictions Weight Bearing Restrictions: No RLE Weight Bearing: Non weight bearing  Therapy/Group: Individual Therapy  Crissie Reese 09/08/2019, 6:34 AM

## 2019-09-08 NOTE — Plan of Care (Signed)
  Problem: Consults Goal: RH GENERAL PATIENT EDUCATION Description: See Patient Education module for education specifics. Outcome: Progressing Goal: Skin Care Protocol Initiated - if Braden Score 18 or less Description: If consults are not indicated, leave blank or document N/A Outcome: Progressing Goal: Nutrition Consult-if indicated Outcome: Progressing Goal: Diabetes Guidelines if Diabetic/Glucose > 140 Description: If diabetic or lab glucose is > 140 mg/dl - Initiate Diabetes/Hyperglycemia Guidelines & Document Interventions  Outcome: Progressing   Problem: RH BOWEL ELIMINATION Goal: RH STG MANAGE BOWEL WITH ASSISTANCE Description: STG Manage Bowel with Assistance. Outcome: Progressing Goal: RH STG MANAGE BOWEL W/MEDICATION W/ASSISTANCE Description: STG Manage Bowel with Medication with Assistance. Outcome: Progressing   Problem: RH BLADDER ELIMINATION Goal: RH STG MANAGE BLADDER WITH ASSISTANCE Description: STG Manage Bladder With Assistance Outcome: Progressing Goal: RH STG MANAGE BLADDER WITH MEDICATION WITH ASSISTANCE Description: STG Manage Bladder With Medication With Assistance. Outcome: Progressing Goal: RH STG MANAGE BLADDER WITH EQUIPMENT WITH ASSISTANCE Description: STG Manage Bladder With Equipment With Assistance Outcome: Progressing   

## 2019-09-08 NOTE — Progress Notes (Signed)
Physical Therapy Session Note  Patient Details  Name: [name] MRN: [id number] Date of Birth: Oct 28, 1960  Today's Date: 09/08/2019 PT Individual Time: 8921-1941 PT Individual Time Calculation (min): 69 min   Short Term Goals: Week 1:  PT Short Term Goal 1 (Week 1): Pt will consisently perform bed mobility with supervision assist PT Short Term Goal 2 (Week 1): Pt will transfer to and from [facility] with CGA consistently PT Short Term Goal 3 (Week 1): Pt will propell WC with supervision assist >268ft PT Short Term Goal 4 (Week 1): Pt will initate gait training in parallel bars with mod assist up to 5 ft  Skilled Therapeutic Interventions/Progress Updates:  Pt received in bed & agreeable to tx. Bed mobility with mod I with bed flat, rails PRN. Pt completes lateral scoot bed>w/c with CGA & manages w/c parts throughout session with only min cuing. W/c propulsion around unit with BUE & distant supervision. Pt completes car transfer at 23" seat height (wife reports car is 29" but pt states that's too tall) x 2 trials via squat pivot with min assist with education re: overall technique & head/hips relationship. Pt reports he will likely sit in lift chair due to position in house so pt completes w/c<>recliner in room to simulate with pt requiring min assist for lateral scoot. Educated pt on ability to sit in rocking recliner if he wishes, but need to place object under chair to prevent rocking motion when transferring to/from chair. Pt completes sit<>stand from w/c to RW with pt trying different hand placement (1UE on RW, 1UE pushing on w/c, vs pushing up with BUE) with pt reporting increased comfort with pushing up with BUE then transitioning to RW. Educated pt on need for 1UE on surface to assist with eccentric lowering for stand>sit. Pt stands x 2 trials with RW & min assist, each trial <1 minute. Lateral scoot w/c<>nu-step with min assist & pt utilizes nu-step on level 5 x 10 minutes with BUE & LLE with  task focusing on global strengthening & endurance training. Pt reports 7/10 RPE towards end of activity. At end of session pt left in w/c with chair alarm donned, nursing staff in room.  Therapy Documentation Precautions:  Precautions Precautions: Fall Restrictions Weight Bearing Restrictions: No RLE Weight Bearing: Non weight bearing  Pain: 3/10 in RLE but pt declines asking for pain medication during session   Therapy/Group: Individual Therapy  Sandi Mariscal 09/08/2019, 12:13 PM

## 2019-09-08 NOTE — Progress Notes (Signed)
[place] PHYSICAL MEDICINE & REHABILITATION PROGRESS NOTE   Subjective/Complaints: No complaints this morning. Working hard in therapy. Therapist has no concerns.   ROS: Patient denies fever, rash, sore throat, blurred vision, nausea, vomiting, diarrhea, cough, shortness of breath or chest pain,  headache, or mood change.    Objective:   No results found. Recent Labs    09/06/19 1413  WBC 10.3  HGB 11.8*  HCT 36.5*  PLT 317   Recent Labs    09/06/19 1413  NA 136  K 4.0  CL 101  CO2 25  GLUCOSE 169*  BUN 10  CREATININE 0.83  CALCIUM 8.0*    Intake/Output Summary (Last 24 hours) at 09/08/2019 0946 Last data filed at 09/08/2019 7673 Gross per 24 hour  Intake 760 ml  Output 2500 ml  Net -1740 ml     Physical Exam: Vital Signs Blood pressure (!) 148/78, pulse 68, temperature 97.9 F (36.6 C), resp. rate 16, height 6\' 3"  (1.905 m), weight 115.9 kg, SpO2 98 %. General: Alert and oriented x 3, No apparent distress HEENT: Head is normocephalic, atraumatic, PERRLA, EOMI, sclera anicteric, oral mucosa pink and moist, dentition intact, ext ear canals clear,  Neck: Supple without JVD or lymphadenopathy Heart: Reg rate and rhythm. No murmurs rubs or gallops Chest: CTA bilaterally without wheezes, rales, or rhonchi; no distress Abdomen: Soft, non-tender, non-distended, bowel sounds positive. Musculoskeletal:  Cervical back: Normal range of motion.  Comments: Right leg less edematous, tender.   Skin:  is warm.   : Wound VAC in place to BKA. shrinker in place Neurological:  alert and oriented x 3. Normal CN exam, normal insight and awareness.  UE motor 5/5. RLE limited by pain. LLE 5/5      Assessment/Plan: 1. 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  Physiatrist is providing close team supervision and 24 hour management of active medical problems listed  below.  Physiatrist and rehab team continue to assess barriers to discharge/monitor patient progress toward functional and medical goals  Care Tool:  Bathing    Body parts bathed by patient: Right arm, Left arm, Chest, Abdomen, Right upper leg, Left upper leg, Face   Body parts bathed by helper: Left lower leg, Buttocks, Front perineal area     Bathing assist Assist Level: Moderate Assistance - Patient 50 - 74%     Upper Body Dressing/Undressing Upper body dressing   What is the patient wearing?: Pull over shirt    Upper body assist Assist Level: Supervision/Verbal cueing    Lower Body Dressing/Undressing Lower body dressing      What is the patient wearing?: Pants, Underwear/pull up     Lower body assist Assist for lower body dressing: Maximal Assistance - Patient 25 - 49%     Toileting Toileting    Toileting assist Assist for toileting: Independent Assistive Device Comment: urinal   Transfers Chair/bed transfer  Transfers assist     Chair/bed transfer assist level: Minimal Assistance - Patient > 75%     Locomotion Ambulation   Ambulation assist   Ambulation activity did not occur: Safety/medical concerns          Walk 10 feet activity   Assist  Walk 10 feet activity did not occur: Safety/medical concerns        Walk 50 feet activity   Assist Walk 50 feet with 2 turns activity did not occur: Safety/medical concerns  Walk 150 feet activity   Assist Walk 150 feet activity did not occur: Safety/medical concerns         Walk 10 feet on uneven surface  activity   Assist Walk 10 feet on uneven surfaces activity did not occur: Safety/medical concerns         Wheelchair     Assist Will patient use wheelchair at discharge?: Yes Type of Wheelchair: Manual    Wheelchair assist level: Supervision/Verbal cueing Max wheelchair distance: 150    Wheelchair 50 feet with 2 turns activity    Assist         Assist Level: Supervision/Verbal cueing   Wheelchair 150 feet activity     Assist      Assist Level: Supervision/Verbal cueing   Blood pressure (!) 148/78, pulse 68, temperature 97.9 F (36.6 C), resp. rate 16, height 6\' 3"  (1.905 m), weight 115.9 kg, SpO2 98 %.    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 -patient maynot yetshower until vac off -ELOS/Goals: 7-10 days, mod I to supervision  -Continue CIR 2. Antithrombotics: -DVT/anticoagulation:Lovenox -antiplatelet therapy: Aspirin 81 mg daily 3. Pain Management:Tramadol 50 mg every 6 hours, oxycodone and Robaxin as needed. Well controlled.  4. Mood. Celexa 20 mg daily 5. Neuropsychology. Patient is capable of making decisions on his own behalf 6. Skin/wound. Routine skin checks 8/17-wound vac for 7 days post-op--remove today---dry dressing with ACE       -second wound cx neg. First with strep   -dc ceftriaxone and flagyl today--> cefdinir oral 300mg  bid 7. Fluids/electrolytes/nutrition. recent labs ok  -  po intake solid 8. Acute blood loss anemia. follow up labs pending 9. Diabetes mellitus with peripheral neuropathy. Hemoglobin A1c 6.6. SSI. Patient on Glucotrol 5 mg twice daily prior to admission as well as Glucophage 500 mg 1 tablet twice daily.   8/16 resumed low dose glucotrol 5mg  daiily  8/18: labile: continue to monitor.  10. Hypertension. Verapamil 240 mg daily. Slightly elevated- continue to monitor.   -borderline to reasonable control, pain component 11. Tobacco abuse. Counseling  LOS: 5 days A FACE TO FACE EVALUATION WAS PERFORMED  9/16 Yaire Kreher 09/08/2019, 9:46 AM

## 2019-09-08 NOTE — Progress Notes (Signed)
Pt dressing changed per order. Pt wife in room, wife observed dressing change. Educated on cleaning area, placing new dressing and wrapping. Stump shrinker applied over new dressing

## 2019-09-09 ENCOUNTER — Inpatient Hospital Stay (HOSPITAL_COMMUNITY): Payer: BC Managed Care – PPO | Admitting: Occupational Therapy

## 2019-09-09 ENCOUNTER — Inpatient Hospital Stay (HOSPITAL_COMMUNITY): Payer: BC Managed Care – PPO

## 2019-09-09 LAB — GLUCOSE, CAPILLARY
Glucose-Capillary: 137 mg/dL — ABNORMAL HIGH (ref 70–99)
Glucose-Capillary: 91 mg/dL (ref 70–99)
Glucose-Capillary: 143 mg/dL — ABNORMAL HIGH (ref 70–99)
Glucose-Capillary: 207 mg/dL — ABNORMAL HIGH (ref 70–99)

## 2019-09-09 MED ORDER — GLIPIZIDE 5 MG PO TABS
5.0000 mg | ORAL_TABLET | Freq: Two times a day (BID) | ORAL | Status: DC
  Administered 2019-09-09 – 2019-09-15 (×12): 5 mg via ORAL
  Filled 2019-09-09 (×12): qty 1

## 2019-09-09 NOTE — Progress Notes (Signed)
Occupational Therapy Session Note  Patient Details  Name: [name] MRN: [id number] Date of Birth: June 27, 1960  Today's Date: 09/09/2019 OT Individual Time: 1030-1314 OT Individual Time Calculation (min): 54 min    Short Term Goals: Week 1:  OT Short Term Goal 1 (Week 1): Pt will complete toileting steps with min A OT Short Term Goal 2 (Week 1): Pt will complete BSC transfer with min A of 1 OT Short Term Goal 3 (Week 1): Pt will complete sit<>stand with mod A of 1 in preparation for BADL tasks  Skilled Therapeutic Interventions/Progress Updates:    Pt received in bed and ready for therapy.  He declined a shower today as he just took one yesterday. Discussed what he needs to work on for his last week here.  He feels he needs to get more UB strength to improve his ability to step with a RW.   Focused today's session on UB strength.    Squat pivot bed to wc Supervision with pt independently setting up his wc with leg rests.  Self propelled to gym.  Worked on theraband exercises (level 4 band) with band anchored above his head on hook focusing on triceps and back: -tricep push downs -lat pull downs -overhead tricep extensions 12-15 reps 3 sets   Cardiovascular HIIT training on [employer] (arm bike): Level 7 1 min of paced turning, 30 seconds of sprints for 2 rounds, 2 min of slow turning and repeated cycle of 1:2 2nd time.  Pt did get breathless on sprints but tolerated well.   He then self propelled back to room. Pt in room prepared for lunch.   All needs met.   Therapy Documentation Precautions:  Precautions Precautions: Fall Precaution Comments:  n Restrictions Weight Bearing Restrictions: Yes RLE Weight Bearing: Non weight bearing Pain: Pain Assessment Pain Scale: 0-10 Pain Score: 0-No pain ADL: ADL Eating: Set up Grooming: Supervision/safety Upper Body Bathing: Setup Lower Body Bathing: Moderate assistance Upper Body Dressing: Setup Lower Body Dressing: Maximal  assistance Toileting: Moderate assistance Toilet Transfer: Moderate assistance Tub/Shower Transfer: Moderate assistance   Therapy/Group: Individual Therapy  [place] 09/09/2019, 12:02 PM

## 2019-09-09 NOTE — Progress Notes (Signed)
Occupational Therapy Weekly Progress Note  Patient Details  Name: [name] MRN: [id number] Date of Birth: [birth year]  Beginning of progress report period: September 04, 2019 End of progress report period: September 09, 2019  Today's Date: 09/09/2019 OT Individual Time: 1400-1500 OT Individual Time Calculation (min): 60 min    Patient has met 3 of 3 short term goals.  Pt is making steady progress towards OT goals. Pt can complete squat-pivot and lateral transfer with CGA. He can now stand with as little as min A, but still needs min/mod A for balance to perform functional taks in standing standing.   Patient continues to demonstrate the following deficits: muscle weakness and decreased standing balance and decreased balance strategies and therefore will continue to benefit from skilled OT intervention to enhance overall performance with BADL and Reduce care partner burden.  Patient progressing toward long term goals..  Continue plan of care.  OT Short Term Goals Week 1:  OT Short Term Goal 1 (Week 1): Pt will complete toileting steps with min A OT Short Term Goal 1 - Progress (Week 1): Met OT Short Term Goal 2 (Week 1): Pt will complete BSC transfer with min A of 1 OT Short Term Goal 2 - Progress (Week 1): Met OT Short Term Goal 3 (Week 1): Pt will complete sit<>stand with mod A of 1 in preparation for BADL tasks OT Short Term Goal 3 - Progress (Week 1): Met Week 2:  OT Short Term Goal 1 (Week 2): LTG=STG 2/2 ELOS  Skilled Therapeutic Interventions/Progress Updates:    Pt greeted seated in wc and agreeable to OT treatment session. Had pt transfer to 18x16 wc to see if he could fit in this size as CM is having a hard time getting correct equipment. Pt then propelled wc to therapy apartment for tub transfer. Pts spouse educated on tub transfer and pt demonstrated CGA transfer into tub. Discussed home layout and safe transfer within home environment. Pt propelled wc to therapy gym and worked  on sit<>stands at [employer] table. Had pt reach around body with alternating UEs to collect clothes pins to simulate standing clohting management. Pt propelled wc back to room and pivoted back to bed with supervision. Pt left semi-reclined in bed with needs met .  Therapy Documentation Precautions:  Precautions Precautions: Fall Precaution Comments:  n Restrictions Weight Bearing Restrictions: Yes RLE Weight Bearing: Non weight bearing Pain: Pain Assessment Pain Scale: 0-10 Pain Score: 0-No pain   Therapy/Group: Individual Therapy  [name] 09/09/2019, 1:10 PM

## 2019-09-09 NOTE — Progress Notes (Signed)
[facility] PHYSICAL MEDICINE & REHABILITATION PROGRESS NOTE   Subjective/Complaints: In good spirits. Progressing with therapies.   ROS: Patient denies fever, rash, sore throat, blurred vision, nausea, vomiting, diarrhea, cough, shortness of breath or chest pain,   headache, or mood change.   Objective:   No results found. Recent Labs    09/06/19 1413  WBC 10.3  HGB 11.8*  HCT 36.5*  PLT 317   Recent Labs    09/06/19 1413  NA 136  K 4.0  CL 101  CO2 25  GLUCOSE 169*  BUN 10  CREATININE 0.83  CALCIUM 8.0*    Intake/Output Summary (Last 24 hours) at 09/09/2019 0919 Last data filed at 09/09/2019 0807 Gross per 24 hour  Intake 640 ml  Output 1850 ml  Net -1210 ml     Physical Exam: Vital Signs Blood pressure (!) 164/81, pulse 74, temperature 97.9 F (36.6 C), resp. rate 16, height 6\' 3"  (1.905 m), weight 115.9 kg, SpO2 97 %. Constitutional: No distress . Vital signs reviewed. HEENT: EOMI, oral membranes moist Neck: supple Cardiovascular: RRR without murmur. No JVD    Respiratory/Chest: CTA Bilaterally without wheezes or rales. Normal effort    GI/Abdomen: BS +, non-tender, non-distended Ext: no clubbing, cyanosis, or edema Psych: pleasant and cooperative Musculoskeletal:  Cervical back: Normal range of motion.  Comments: right bk edema improving   Skin:  is warm.   : wound c/i with minimal s/s drainage. shrinker on appropriately Neurological:  Alert and oriented x 3. Normal insight and awareness. Intact Memory. Normal language and speech. Cranial nerve exam unremarkable   UE motor 5/5. RLE limited by pain. LLE 5/5      Assessment/Plan: 1. 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  Physiatrist is providing close team supervision and 24 hour management of active medical problems listed below.  Physiatrist and rehab team continue to assess barriers to  discharge/monitor patient progress toward functional and medical goals  Care Tool:  Bathing    Body parts bathed by patient: Right arm, Left arm, Chest, Abdomen, Right upper leg, Left upper leg, Face   Body parts bathed by helper: Left lower leg, Buttocks, Front perineal area     Bathing assist Assist Level: Moderate Assistance - Patient 50 - 74%     Upper Body Dressing/Undressing Upper body dressing   What is the patient wearing?: Pull over shirt    Upper body assist Assist Level: Supervision/Verbal cueing    Lower Body Dressing/Undressing Lower body dressing      What is the patient wearing?: Pants, Underwear/pull up     Lower body assist Assist for lower body dressing: Maximal Assistance - Patient 25 - 49%     Toileting Toileting    Toileting assist Assist for toileting: Independent Assistive Device Comment: urinal   Transfers Chair/bed transfer  Transfers assist     Chair/bed transfer assist level: Contact Guard/Touching assist     Locomotion Ambulation   Ambulation assist   Ambulation activity did not occur: Safety/medical concerns          Walk 10 feet activity   Assist  Walk 10 feet activity did not occur: Safety/medical concerns        Walk 50 feet activity   Assist Walk 50 feet with 2 turns activity did not occur: Safety/medical concerns         Walk 150 feet activity   Assist Walk 150  feet activity did not occur: Safety/medical concerns         Walk 10 feet on uneven surface  activity   Assist Walk 10 feet on uneven surfaces activity did not occur: Safety/medical concerns         Wheelchair     Assist Will patient use wheelchair at discharge?: Yes Type of Wheelchair: Manual    Wheelchair assist level: Supervision/Verbal cueing Max wheelchair distance: 150    Wheelchair 50 feet with 2 turns activity    Assist        Assist Level: Supervision/Verbal cueing   Wheelchair 150 feet activity      Assist      Assist Level: Supervision/Verbal cueing   Blood pressure (!) 164/81, pulse 74, temperature 97.9 F (36.6 C), resp. rate 16, height 6\' 3"  (1.905 m), weight 115.9 kg, SpO2 97 %.    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 -patient [place] with leg covered -ELOS/Goals: 7-10 days, mod I to supervision  -Continue CIR 2. Antithrombotics: -DVT/anticoagulation:Lovenox -antiplatelet therapy: Aspirin 81 mg daily 3. Pain Management:Tramadol 50 mg every 6 hours, oxycodone and Robaxin as needed. Well controlled.  4. Mood. Celexa 20 mg daily 5. Neuropsychology. Patient is capable of making decisions on his own behalf 6. Skin/wound. Routine skin checks 8/19- may use ABD pad and ACE to leg  -can switch to shrinker alone soon   -second wound cx neg. First with strep  -continue cefdinir oral 300mg  bid for 2 weeks from 8/17 7. Fluids/electrolytes/nutrition. recent labs ok  -  po intake good 8. Acute blood loss anemia. follow up labs pending 9. Diabetes mellitus with peripheral neuropathy. Hemoglobin A1c 6.6. SSI. Patient on Glucotrol 5 mg twice daily prior to admission as well as Glucophage 500 mg 1 tablet twice daily.   8/16 resumed low dose glucotrol 5mg  daiily  8/19 improving control. Resume bid glucotrol dose today 10. Hypertension. Verapamil 240 mg daily. Slightly elevated- continue to monitor.   -borderline to reasonable control--no changes 11. Tobacco abuse. Counseling  LOS: 6 days A FACE TO FACE EVALUATION WAS PERFORMED  9/16 09/09/2019, 9:19 AM

## 2019-09-09 NOTE — Plan of Care (Signed)
  Problem: Consults °Goal: RH GENERAL PATIENT EDUCATION °Description: See Patient Education module for education specifics. °Outcome: Progressing °Goal: Skin Care Protocol Initiated - if Braden Score 18 or less °Description: If consults are not indicated, leave blank or document N/A °Outcome: Progressing °Goal: Nutrition Consult-if indicated °Outcome: Progressing °Goal: Diabetes Guidelines if Diabetic/Glucose > 140 °Description: If diabetic or lab glucose is > 140 mg/dl - Initiate Diabetes/Hyperglycemia Guidelines & Document Interventions  °Outcome: Progressing °  °Problem: RH BOWEL ELIMINATION °Goal: RH STG MANAGE BOWEL WITH ASSISTANCE °Description: STG Manage Bowel with Assistance. °Outcome: Progressing °Goal: RH STG MANAGE BOWEL W/MEDICATION W/ASSISTANCE °Description: STG Manage Bowel with Medication with Assistance. °Outcome: Progressing °  °Problem: RH BLADDER ELIMINATION °Goal: RH STG MANAGE BLADDER WITH ASSISTANCE °Description: STG Manage Bladder With Assistance °Outcome: Progressing °Goal: RH STG MANAGE BLADDER WITH MEDICATION WITH ASSISTANCE °Description: STG Manage Bladder With Medication With Assistance. °Outcome: Progressing °Goal: RH STG MANAGE BLADDER WITH EQUIPMENT WITH ASSISTANCE °Description: STG Manage Bladder With Equipment With Assistance °Outcome: Progressing °  °Problem: RH SKIN INTEGRITY °Goal: RH STG SKIN FREE OF INFECTION/BREAKDOWN °Outcome: Progressing °Goal: RH STG MAINTAIN SKIN INTEGRITY WITH ASSISTANCE °Description: STG Maintain Skin Integrity With Assistance. °Outcome: Progressing °Goal: RH STG ABLE TO PERFORM INCISION/WOUND CARE W/ASSISTANCE °Description: STG Able To Perform Incision/Wound Care With Assistance. °Outcome: Progressing °  °Problem: RH SAFETY °Goal: RH STG ADHERE TO SAFETY PRECAUTIONS W/ASSISTANCE/DEVICE °Description: STG Adhere to Safety Precautions With Assistance/Device. °Outcome: Progressing °Goal: RH STG DECREASED RISK OF FALL WITH ASSISTANCE °Description: STG  Decreased Risk of Fall With Assistance. °Outcome: Progressing °  °Problem: RH PAIN MANAGEMENT °Goal: RH STG PAIN MANAGED AT OR BELOW PT'S PAIN GOAL °Outcome: Progressing °  °Problem: RH KNOWLEDGE DEFICIT GENERAL °Goal: RH STG INCREASE KNOWLEDGE OF SELF CARE AFTER HOSPITALIZATION °Outcome: Progressing °  °Problem: Consults °Goal: RH GENERAL PATIENT EDUCATION °Description: See Patient Education module for education specifics. °Outcome: Progressing °  °

## 2019-09-09 NOTE — Consult Note (Signed)
Neuropsychological Consultation   Patient:   [name]   DOB:   1960-11-17  MR Number:  [id number]  Location:  [name] [address] CENTER [address] [id number] [place] [address] Dept: [phone] Loc: [phone]           Date of Service:   09/08/2019  Start Time:   2 PM End Time:   3 PM  Provider/Observer:  Arley Phenix, Psy.D.       Clinical Neuropsychologist       Billing Code/Service: 29476  Chief Complaint:    [name] is a 59 year old male with a history of diabetes, hyperlipidemia, morbid obesity, hypertension, hepatitis C and tobacco abuse.  Patient has been dealing with a significant right heel ulcer for some time.  The patient presented on 08/26/2019 with gangrenous changes in his right foot and had recently undergone incision and drainage on 08/20/2019 on an outpatient basis.  The patient presented with increasing pain and swelling of his right heel as well as low-grade fever.  MRI of the right ankle and foot showed soft tissue ulcer posterior medial aspect of the hindfoot with evidence of osteomyelitis or abscess.  The foot was felt to be unsalvageable and the patient underwent right below the knee amputation on 09/01/2019.  Patient was admitted to the CIR due to functional limitations after BKA.  Reason for Service:  The patient was referred for neuropsychological consultation due to coping and adjustment issues following his BKA.  Below is the HPI for the current admission.  LYY:TKPTWS C. Michael is a 59 year old right-handed male with history of diabetes mellitus, hyperlipidemia,morbid obesity with BMI 33.04,hypertension, hepatitis C and tobacco abuse.Per chart review patient lives with spouse. 1 level home one-step to entry. Patient reportedly rather sedentary due to right heel ulcer. Presented 8/5/2021with gangrenous changes right calcaneus and recently underwent incision and drainage  08/20/2019 by Dr. Lajoyce Corners on an outpatient basis. Presented with increasing pain and swelling of right heelas well as low-grade fever. MRI of the right ankle and foot showed soft tissue ulcer posterior medial aspect of the hindfoot at the level of the posterior calcaneus no evidence of osteomyelitis or abscess. Admission chemistry sodium 131, glucose 129, creatinine 0.57, WBC 10,800, hemoglobin 11.3. 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 Lajoyce Corners. Wound VAC applied as directed.Initially maintained on Zosynchanged to Rocephinand Flagyl initial cultures growing gram-positive cocci gram-negative rods blood cultures negative. Postoperative hemoglobin 11.6. Placed on Lovenox for DVT prophylaxis. Therapy evaluations completed and patient was admitted for a comprehensive rehab program.  Current Status:  The patient reports that his mood has been fairly positive especially given what is transpired and resulted in his lower leg amputation.  The patient reports that he is hopeful that he will be able to recover well and be able to get a prosthetic in the very near future and is realistic about the timeframe at this point.  The patient denies any significant depression or anxiety and his current cognitive and mental status appears to be within normal limits.  Behavioral Observation: [name]  presents as a 59 y.o.-year-old Right Caucasian Male who appeared his stated age. his dress was Appropriate and he was Well Groomed and his manners were Appropriate to the situation.  his participation was indicative of Appropriate and Attentive behaviors.  There were any physical disabilities noted.  he displayed an appropriate level of cooperation and motivation.  Interactions:    Active Appropriate  Attention:   within normal limits and attention span and concentration were age appropriate  Memory:   within normal limits; recent and remote memory  intact  Visuo-spatial:  not examined  Speech (Volume):  normal  Speech:   normal; normal  Thought Process:  Coherent and Relevant  Though Content:  WNL; not suicidal and not homicidal  Orientation:   person, place, time/date and situation  Judgment:   Fair  Planning:   Fair  Affect:    Appropriate  Mood:    Euthymic  Insight:   Good  Intelligence:   normal  Medical History:   Past Medical History:  Diagnosis Date  . Diabetic neuropathy ([name])   . Diverticulitis   . History of hepatitis C    has been treated in the past  . History of kidney stones   . Hyperlipidemia   . Hypertension   . Other testicular hypofunction   . Type II or unspecified type diabetes mellitus without mention of complication, not stated as uncontrolled   . Vitamin D deficiency        Psychiatric History:  No prior psychiatric history noted  Family Med/Psych History:  Family History  Problem Relation Age of Onset  . Hypertension Mother   . Cancer Father        colon  . Alzheimer's disease Father   . Stroke Father     Impression/DX:  [name] is a 59 year old male with a history of diabetes, hyperlipidemia, morbid obesity, hypertension, hepatitis C and tobacco abuse.  Patient has been dealing with a significant right heel ulcer for some time.  The patient presented on 08/26/2019 with gangrenous changes in his right foot and had recently undergone incision and drainage on 08/20/2019 on an outpatient basis.  The patient presented with increasing pain and swelling of his right heel as well as low-grade fever.  MRI of the right ankle and foot showed soft tissue ulcer posterior medial aspect of the hindfoot with evidence of osteomyelitis or abscess.  The foot was felt to be unsalvageable and the patient underwent right below the knee amputation on 09/01/2019.  Patient was admitted to the CIR due to functional limitations after BKA.  The patient reports that his mood has been fairly positive  especially given what is transpired and resulted in his lower leg amputation.  The patient reports that he is hopeful that he will be able to recover well and be able to get a prosthetic in the very near future and is realistic about the timeframe at this point.  The patient denies any significant depression or anxiety and his current cognitive and mental status appears to be within normal limits.  Disposition/Plan:  Today we worked on coping and adjustment issues following his BKA.  The patient appears to be adapting appropriately and remains motivated to fully participate in the therapeutic efforts here on the inpatient unit.  At this point, I do not think I will need to see the patient again although I remain available if situation and symptoms change.  Diagnosis:    BKA        Electronically Signed   _______________________ Arley Phenix, Psy.D.

## 2019-09-09 NOTE — Progress Notes (Signed)
Physical Therapy Session Note  Patient Details  Name: [name] MRN: [id number] Date of Birth: [birth year]  Today's Date: 09/09/2019 PT Individual Time: 4270-6237 PT Individual Time Calculation (min): 75 min   Short Term Goals: Week 1:  PT Short Term Goal 1 (Week 1): Pt will consisently perform bed mobility with supervision assist PT Short Term Goal 2 (Week 1): Pt will transfer to and from [facility] with CGA consistently PT Short Term Goal 3 (Week 1): Pt will propell WC with supervision assist >267ft PT Short Term Goal 4 (Week 1): Pt will initate gait training in parallel bars with mod assist up to 5 ft Week 2:    Week 3:     Skilled Therapeutic Interventions/Progress Updates:    PAIN denies pain this am   Therapist applied ABD pad and acewrap + shrinker to RLE/residual limb.  Supine to sit mod I Lateral scoot transfer bed to wc w/set up and supervision wc propulsion 176ft w/supervision including tight turns.  Parallel bar therex: STS using bars w/cues for ant wt shift and use of momentum, repeated mult time thru session/cga  Standing w/single UE support hip abd x 20 Standing w/bilat UE support hip extension x 20 Standing w/bilat UEs on bars - shoulder depression x 15  Standing balance: Standing w/single UE support turning head side to side and up/down slowly w/cga, pt locks L knee for stability.  Rests several min between each therex above.  wc to mat lateral scoot w/set up and supervision.  Seated LAq w/emphasis on full extension x 15 Seated push up blocks for triceps and shoulder depression x 10  Sit to supine to prone w/supervision. Prone hip extension 2x10 Prone knee flexion 2x10  Pt requires cueing for proper breathing during therex/avoid holding breath.  Prone to supine to sit w/supervision. Repeated push up blocks x 5 reps  Mat to wc lateral scoot w/set up/supervision.  wc proulsion x 111ft mod I to room. Lateral scoot wc to bed w/supervision.  Sit to  supine w/supervision. Pt left supine w/rails up x 4, alarm set, bed in lowest position, and needs in reach.   Therapy Documentation Precautions:  Precautions Precautions: Fall Precaution Comments:  n Restrictions Weight Bearing Restrictions: Yes RLE Weight Bearing: Non weight bearing    Therapy/Group: Individual Therapy  Rada Hay, PT   [name] 09/09/2019, 3:19 PM

## 2019-09-09 NOTE — Progress Notes (Signed)
Patient ID: [name], male   DOB: 06/06/1960, 59 y.o.   MRN: [id number]  Sw received phone call from pt wife who reported preferred [facility] is 1)[facility] and [facility]) [facility]. SW to explore options. SW also discussed challenges with getting a 20x18 w/c in which they are all out of stock, and we can either get an 18x16 or 24x18 with anti-tippers through [facility]. Their current 20x18 w/c do not have anti-tippers. Pt and wife to discuss and will follow-up with SW.   SW sent HHPT/OT/?SN referral to [facility].   Cecile Sheerer, MSW, LCSWA Office: [phone] Cell: [phone] Fax: [phone]

## 2019-09-10 ENCOUNTER — Inpatient Hospital Stay (HOSPITAL_COMMUNITY): Payer: BC Managed Care – PPO | Admitting: Occupational Therapy

## 2019-09-10 ENCOUNTER — Inpatient Hospital Stay (HOSPITAL_COMMUNITY): Payer: BC Managed Care – PPO | Admitting: Physical Therapy

## 2019-09-10 ENCOUNTER — Inpatient Hospital Stay (HOSPITAL_COMMUNITY): Payer: BC Managed Care – PPO

## 2019-09-10 LAB — GLUCOSE, CAPILLARY
Glucose-Capillary: 122 mg/dL — ABNORMAL HIGH (ref 70–99)
Glucose-Capillary: 149 mg/dL — ABNORMAL HIGH (ref 70–99)
Glucose-Capillary: 144 mg/dL — ABNORMAL HIGH (ref 70–99)
Glucose-Capillary: 174 mg/dL — ABNORMAL HIGH (ref 70–99)

## 2019-09-10 NOTE — Progress Notes (Signed)
Physical Therapy Weekly Progress Note  Patient Details  Name: [name] MRN: [id number] Date of Birth: 07-22-1960  Beginning of progress report period: September 04, 2019 End of progress report period: September 10, 2019  Today's Date: 09/10/2019 PT Individual Time: 8453-6468 and 0321-2248 PT Individual Time Calculation (min): 55 min and 45 min  Patient has met 3 of 4 short term goals.  Pt is making steady progress towards LTGs as he currently requires CGA for transfers & is progressing slowly with gait (pt was able to ambulate ~5 ft with min assist +w/c follow for safety with RW yesterday). Pt would benefit from continued skilled PT treatment to focus on strengthening, endurance & balance training, and for pt education to increase independence prior to d/c.   Patient continues to demonstrate the following deficits muscle weakness, decreased cardiorespiratoy endurance, decreased coordination and decreased sitting balance, decreased standing balance, decreased postural control and decreased balance strategies and therefore will continue to benefit from skilled PT intervention to increase functional independence with mobility.  Patient is making steady progress towards transfer & w/c goals, slow progress with gait goals. Household ambulation goal discharged 2/2 slow progress, bed<>w/c & gait in controlled environment downgraded 2/2 slow progress..     PT Short Term Goals Week 1:  PT Short Term Goal 1 (Week 1): Pt will consisently perform bed mobility with supervision assist PT Short Term Goal 1 - Progress (Week 1): Met PT Short Term Goal 2 (Week 1): Pt will transfer to and from [facility] with CGA consistently PT Short Term Goal 2 - Progress (Week 1): Met PT Short Term Goal 3 (Week 1): Pt will propell WC with supervision assist >257f PT Short Term Goal 3 - Progress (Week 1): Met PT Short Term Goal 4 (Week 1): Pt will initate gait training in parallel bars with mod assist up to 5 ft PT Short Term Goal  4 - Progress (Week 1): Not met Week 2:  PT Short Term Goal 1 (Week 2): STG = LTG due to estimated d/c date.  Skilled Therapeutic Interventions/Progress Updates:  Treatment 1: Pt received in w/c with wife ([name] present for first half of session. Pt propels w/c room<>north tower with mod I & outdoors over uneven ground with supervision. Pt completes real car transfer x 2 with CGA with PT discussing technique & safety with THelene Kelpassisting during 2nd transfer. Pt voices comfort with task. Back on unit pt transfers w/c>bed with supervision for lateral scoot. Pt performs LLE knee flexion with blue theraband for resistance, and BLE hip abduction with blue theraband, both 2 sets x 20 reps for strengthening. Pt left sitting on EOB with bed alarm set, call bell & all needs in reach. Pain: no formal c/o pain reported  Treatment 2: Pt received in bed reporting need to use restroom. Pt completes lateral scoot bed>w/c with supervision & propels w/c in room/bathroom with distant supervision. Pt completes toilet transfer with grab bar & clothing management with supervision. Pt with continent BM on toilet & performs peri hygiene independently, and hand hygiene at sink independently. Set hospital bed at 36" tall to simulate pt's bed at home (educated him on ability to sleep on opposite side that pt reports is 29" tall or to remove box spring to lower bed height) & pt completes stand pivot w/c<>bed x 2 trial with min assist fade to CGA with pt demonstrating good safety awareness with task. Pt propels w/c room<>ortho gym with BUE & mod I. Pt utilize BUE ergometer (pt  requested to work on upper body strengthening) on Hill Profile x 4 min 30 seconds + 5 minutes 30 seconds for BUE strengthening & cardiopulmonary endurance training with rest breaks 2/2 fatigue & PT educating pt on pursed lip breathing. Back in room, pt transfers w/c>slightly elevated bed with supervision via lateral scoot. Pt left on EOB with all needs in reach,  bed alarm set.  Pain: no formal c/o pain reported  Therapy Documentation Precautions:  Precautions Precautions: Fall Precaution Comments:  n Restrictions Weight Bearing Restrictions: Yes RLE Weight Bearing: Non weight bearing   Therapy/Group: Individual Therapy  Waunita Schooner 09/10/2019, 4:02 PM

## 2019-09-10 NOTE — Progress Notes (Signed)
Physical Therapy Session Note  Patient Details  Name: [name] MRN: [id number] Date of Birth: 1960/08/25  Today's Date: 09/10/2019 PT Individual Time: 0900-0945 PT Individual Time Calculation (min): 45 min   Short Term Goals: Week 1:  PT Short Term Goal 1 (Week 1): Pt will consisently perform bed mobility with supervision assist PT Short Term Goal 2 (Week 1): Pt will transfer to and from [facility] with CGA consistently PT Short Term Goal 3 (Week 1): Pt will propell WC with supervision assist >246ft PT Short Term Goal 4 (Week 1): Pt will initate gait training in parallel bars with mod assist up to 5 ft Week 2:    Week 3:     Skilled Therapeutic Interventions/Progress Updates:    PAIN denies pain  Pt initially oob in wc and agreeable to session. Pt manages wc parts independently.  Wc propulsion x 165ft mod I.  wc press ups for shoulder depression strengthening x 8  wc to mat lateral scoot w/assist only to stabilize wc on tile floor.  In sitting therapist rewrapped residual limb and applied shrinker.  LAQs x 20.  Repeated STS from slightly elevated mat x 6 reps initially w/min assist but progressed to cga.  Initiated gait training: STS to walker w/cga.  Gait 23ft via press and sway w/good control of ground reaction force on L, min assist but +2 for safety due to LLE weakness/fall risk, wc follow.    wc to mat lateral scoot as above.  Sit to supine to prone Independently. Prone R hip extension 2x20            r knee flexion w/progressivly increased knee flexion achieved x2x20  Prone to supine to sit independently. Lateral scoot as above to wc.  wc propulsion 155ft mod I. To room. Pt left oob in wc w/alarm belt set and needs in reach.   Therapy Documentation Precautions:  Precautions Precautions: Fall Precaution Comments:  n Restrictions Weight Bearing Restrictions: Yes RLE Weight Bearing: Non weight bearing    Therapy/Group: Individual Therapy  Rada Hay,  PT   [name] 09/10/2019, 12:47 PM

## 2019-09-10 NOTE — Plan of Care (Signed)
Household ambulation goal d/c 2/2 slow progress & unsafe at this time. Gait in controlled environment & bed<>w/c transfer goal downgraded 2/2 slow progress.  Problem: RH Ambulation Goal: LTG Patient will ambulate in controlled environment (PT) Description: LTG: Patient will ambulate in a controlled environment, # of feet with assistance (PT). 09/10/2019 1256 by Sandi Mariscal, PT Flowsheets (Taken 09/10/2019 1256) LTG: Pt will ambulate in controlled environ  assist needed:: Minimal Assistance - Patient > 75% LTG: Ambulation distance in controlled environment: 10 ft with LRAD Note: 10 ft with LRAD 09/10/2019 1252 by Sandi Mariscal, PT Outcome: Not Progressing Flowsheets (Taken 09/10/2019 1252) LTG: Pt will ambulate in controlled environ  assist needed:: (d/c goal 2/2 slow progress) -- Note: d/c goal 2/2 slow progress   Problem: RH Ambulation Goal: LTG Patient will ambulate in home environment (PT) Description: LTG: Patient will ambulate in home environment, # of feet with assistance (PT). 09/10/2019 1256 by Sandi Mariscal, PT Outcome: Not Applicable Flowsheets (Taken 09/10/2019 1256) LTG: Ambulation distance in home environment: d/c goal 2/2 slow progres 09/10/2019 1252 by Sandi Mariscal, PT Outcome: Not Progressing Flowsheets (Taken 09/10/2019 1252) LTG: Pt will ambulate in home environ  assist needed:: (d/c goal 2/2 slow progress) -- Note: d/c goal 2/2 slow progress Goal: LTG Patient will ambulate in community environment (PT) Description: LTG: Patient will ambulate in community environment, # of feet with assistance (PT). 09/10/2019 1256 by Sandi Mariscal, PT Outcome: Not Applicable Flowsheets (Taken 09/10/2019 1256) LTG: Ambulation distance in community environment: goal entered in error 09/10/2019 1256 by Sandi Mariscal, PT Reactivated   Problem: RH Bed to Chair Transfers Goal: LTG Patient will perform bed/chair transfers w/assist (PT) Description: LTG: Patient  will perform bed to chair transfers with assistance (PT). Flowsheets (Taken 09/10/2019 1252) LTG: Pt will perform Bed to Chair Transfers with assistance level: (downgrade 2/2 slow progress) -- Note: downgrade 2/2 slow progress

## 2019-09-10 NOTE — Progress Notes (Signed)
[facility] PHYSICAL MEDICINE & REHABILITATION PROGRESS NOTE   Subjective/Complaints: No new complaints. Feels well. Pleased with progress  ROS: Patient denies fever, rash, sore throat, blurred vision, nausea, vomiting, diarrhea, cough, shortness of breath or chest pain, joint or back pain, headache, or mood change.    Objective:   No results found. No results for input(s): WBC, HGB, HCT, PLT in the last 72 hours. No results for input(s): NA, K, CL, CO2, GLUCOSE, BUN, CREATININE, CALCIUM in the last 72 hours.  Intake/Output Summary (Last 24 hours) at 09/10/2019 0951 Last data filed at 09/10/2019 0814 Gross per 24 hour  Intake 682 ml  Output 1780 ml  Net -1098 ml     Physical Exam: Vital Signs Blood pressure (!) 158/73, pulse 65, temperature 97.9 F (36.6 C), resp. rate 16, height 6\' 3"  (1.905 m), weight 115.9 kg, SpO2 97 %. Constitutional: No distress . Vital signs reviewed. HEENT: EOMI, oral membranes moist Neck: supple Cardiovascular: RRR without murmur. No JVD    Respiratory/Chest: CTA Bilaterally with a few wheezes . Normal effort    GI/Abdomen: BS +, non-tender, non-distended Ext: no clubbing, cyanosis, or edema Psych: pleasant and cooperative Musculoskeletal:  Cervical back: Normal range of motion.  Comments: right bk edema present   Skin:  is warm.   : wound c/i with minimal drainage, shrinker on appropriately Neurological:  Alert and oriented x 3. Normal insight and awareness. Intact Memory. Normal language and speech. Cranial nerve exam unremarkable   UE motor 5/5. RLE 3+/5 KE and HF. LLE 5/5      Assessment/Plan: 1. 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  Physiatrist is providing close team supervision and 24 hour management of active medical problems listed below.  Physiatrist and rehab team continue to assess barriers to discharge/monitor patient  progress toward functional and medical goals  Care Tool:  Bathing    Body parts bathed by patient: Right arm, Left arm, Chest, Abdomen, Right upper leg, Left upper leg, Face   Body parts bathed by helper: Left lower leg, Buttocks, Front perineal area     Bathing assist Assist Level: Moderate Assistance - Patient 50 - 74%     Upper Body Dressing/Undressing Upper body dressing   What is the patient wearing?: Pull over shirt    Upper body assist Assist Level: Supervision/Verbal cueing    Lower Body Dressing/Undressing Lower body dressing      What is the patient wearing?: Pants, Underwear/pull up     Lower body assist Assist for lower body dressing: Maximal Assistance - Patient 25 - 49%     Toileting Toileting    Toileting assist Assist for toileting: Independent Assistive Device Comment: urinal   Transfers Chair/bed transfer  Transfers assist     Chair/bed transfer assist level: Supervision/Verbal cueing     Locomotion Ambulation   Ambulation assist   Ambulation activity did not occur: Safety/medical concerns          Walk 10 feet activity   Assist  Walk 10 feet activity did not occur: Safety/medical concerns        Walk 50 feet activity   Assist Walk 50 feet with 2 turns activity did not occur: Safety/medical concerns         Walk 150 feet activity   Assist Walk 150 feet activity did not occur: Safety/medical concerns         Walk 10 feet on uneven  surface  activity   Assist Walk 10 feet on uneven surfaces activity did not occur: Safety/medical concerns         Wheelchair     Assist Will patient use wheelchair at discharge?: Yes Type of Wheelchair: Manual    Wheelchair assist level: Supervision/Verbal cueing Max wheelchair distance: 150    Wheelchair 50 feet with 2 turns activity    Assist        Assist Level: Supervision/Verbal cueing   Wheelchair 150 feet activity     Assist      Assist  Level: Supervision/Verbal cueing   Blood pressure (!) 158/73, pulse 65, temperature 97.9 F (36.6 C), resp. rate 16, height 6\' 3"  (1.905 m), weight 115.9 kg, SpO2 97 %.    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 -patient mayshower with leg covered -ELOS/Goals: 7-10 days, mod I to supervision  -Continue CIR 2. Antithrombotics: -DVT/anticoagulation:Lovenox -antiplatelet therapy: Aspirin 81 mg daily 3. Pain Management:Tramadol 50 mg every 6 hours, oxycodone and Robaxin as needed. Well controlled.  4. Mood. Celexa 20 mg daily 5. Neuropsychology. Patient is capable of making decisions on his own behalf 6. Skin/wound. Routine skin checks 8/20- ABD pad, ACE, shrinker  -continue cefdinir oral 300mg  bid for 2 weeks from 8/17 7. Fluids/electrolytes/nutrition. recent labs ok  -  po intake good 8. Acute blood loss anemia. follow up labs pending 9. Diabetes mellitus with peripheral neuropathy. Hemoglobin A1c 6.6. SSI. Patient on Glucotrol 5 mg twice daily prior to admission as well as Glucophage 500 mg 1 tablet twice daily.   8/16 resumed low dose glucotrol 5mg  daiily  8/19 improving control. Resumed bid glucotrol  10. Hypertension. Verapamil 240 mg daily. Slightly elevated- continue to monitor.   -borderline to reasonable control--no changes 8/20 11. Tobacco abuse. Counseling  LOS: 7 days A FACE TO FACE EVALUATION WAS PERFORMED  09/10/2019, 9:51 AM

## 2019-09-10 NOTE — Plan of Care (Signed)
  Problem: Consults °Goal: RH GENERAL PATIENT EDUCATION °Description: See Patient Education module for education specifics. °Outcome: Progressing °Goal: Skin Care Protocol Initiated - if Braden Score 18 or less °Description: If consults are not indicated, leave blank or document N/A °Outcome: Progressing °Goal: Nutrition Consult-if indicated °Outcome: Progressing °Goal: Diabetes Guidelines if Diabetic/Glucose > 140 °Description: If diabetic or lab glucose is > 140 mg/dl - Initiate Diabetes/Hyperglycemia Guidelines & Document Interventions  °Outcome: Progressing °  °Problem: RH BOWEL ELIMINATION °Goal: RH STG MANAGE BOWEL WITH ASSISTANCE °Description: STG Manage Bowel with Assistance. °Outcome: Progressing °Goal: RH STG MANAGE BOWEL W/MEDICATION W/ASSISTANCE °Description: STG Manage Bowel with Medication with Assistance. °Outcome: Progressing °  °Problem: RH BLADDER ELIMINATION °Goal: RH STG MANAGE BLADDER WITH ASSISTANCE °Description: STG Manage Bladder With Assistance °Outcome: Progressing °Goal: RH STG MANAGE BLADDER WITH MEDICATION WITH ASSISTANCE °Description: STG Manage Bladder With Medication With Assistance. °Outcome: Progressing °Goal: RH STG MANAGE BLADDER WITH EQUIPMENT WITH ASSISTANCE °Description: STG Manage Bladder With Equipment With Assistance °Outcome: Progressing °  °Problem: RH SKIN INTEGRITY °Goal: RH STG SKIN FREE OF INFECTION/BREAKDOWN °Outcome: Progressing °Goal: RH STG MAINTAIN SKIN INTEGRITY WITH ASSISTANCE °Description: STG Maintain Skin Integrity With Assistance. °Outcome: Progressing °Goal: RH STG ABLE TO PERFORM INCISION/WOUND CARE W/ASSISTANCE °Description: STG Able To Perform Incision/Wound Care With Assistance. °Outcome: Progressing °  °Problem: RH SAFETY °Goal: RH STG ADHERE TO SAFETY PRECAUTIONS W/ASSISTANCE/DEVICE °Description: STG Adhere to Safety Precautions With Assistance/Device. °Outcome: Progressing °Goal: RH STG DECREASED RISK OF FALL WITH ASSISTANCE °Description: STG  Decreased Risk of Fall With Assistance. °Outcome: Progressing °  °Problem: RH PAIN MANAGEMENT °Goal: RH STG PAIN MANAGED AT OR BELOW PT'S PAIN GOAL °Outcome: Progressing °  °Problem: RH KNOWLEDGE DEFICIT GENERAL °Goal: RH STG INCREASE KNOWLEDGE OF SELF CARE AFTER HOSPITALIZATION °Outcome: Progressing °  °Problem: Consults °Goal: RH GENERAL PATIENT EDUCATION °Description: See Patient Education module for education specifics. °Outcome: Progressing °  °

## 2019-09-10 NOTE — Plan of Care (Deleted)
Gait goals discharged 2/2 slow progress, bed<>w/c transfer goal downgraded 2/2 slow progress.  Problem: RH Ambulation Goal: LTG Patient will ambulate in controlled environment (PT) Description: LTG: Patient will ambulate in a controlled environment, # of feet with assistance (PT). Outcome: Not Progressing Flowsheets (Taken 09/10/2019 1252) LTG: Pt will ambulate in controlled environ  assist needed:: (d/c goal 2/2 slow progress) -- Note: d/c goal 2/2 slow progress Goal: LTG Patient will ambulate in home environment (PT) Description: LTG: Patient will ambulate in home environment, # of feet with assistance (PT). Outcome: Not Progressing Flowsheets (Taken 09/10/2019 1252) LTG: Pt will ambulate in home environ  assist needed:: (d/c goal 2/2 slow progress) -- Note: d/c goal 2/2 slow progress   Problem: RH Bed to Chair Transfers Goal: LTG Patient will perform bed/chair transfers w/assist (PT) Description: LTG: Patient will perform bed to chair transfers with assistance (PT). Flowsheets (Taken 09/10/2019 1252) LTG: Pt will perform Bed to Chair Transfers with assistance level: (downgrade 2/2 slow progress) -- Note: downgrade 2/2 slow progress

## 2019-09-11 ENCOUNTER — Encounter (HOSPITAL_COMMUNITY): Payer: BC Managed Care – PPO | Admitting: Occupational Therapy

## 2019-09-11 LAB — GLUCOSE, CAPILLARY
Glucose-Capillary: 123 mg/dL — ABNORMAL HIGH (ref 70–99)
Glucose-Capillary: 115 mg/dL — ABNORMAL HIGH (ref 70–99)
Glucose-Capillary: 223 mg/dL — ABNORMAL HIGH (ref 70–99)
Glucose-Capillary: 115 mg/dL — ABNORMAL HIGH (ref 70–99)

## 2019-09-11 NOTE — Progress Notes (Signed)
Patient complained that he wanted to leave AMA because of multiple things going wrong but his biggest complain was that the kitchen has brought him the wrong food for four times and that he has been complaining about it. This RN tried to talk to him and his wife to stay, and tried to explain to them about the important of completing his therapy but he insisted that he doesn't care and that he is leaving today no matter what. But when this RN went in to his room and talk to him for the third time he finally decided to stay. Dietary services called and they promised to correct that problem. A very good lunch tray was later sent to patient and he was very happy. He apologized for everything. We continue to monitor.

## 2019-09-11 NOTE — Progress Notes (Signed)
[name] PHYSICAL MEDICINE & REHABILITATION PROGRESS NOTE   Subjective/Complaints: Feeling well. Progressing with therapy. Working on transfers into car, simulating bed yesterday. Wife coming in this morning  ROS: Patient denies fever, rash, sore throat, blurred vision, nausea, vomiting, diarrhea, cough, shortness of breath or chest pain,   headache, or mood change.   Objective:   No results found. No results for input(s): WBC, HGB, HCT, PLT in the last 72 hours. No results for input(s): NA, K, CL, CO2, GLUCOSE, BUN, CREATININE, CALCIUM in the last 72 hours.  Intake/Output Summary (Last 24 hours) at 09/11/2019 0940 Last data filed at 09/11/2019 0750 Gross per 24 hour  Intake 777 ml  Output 2750 ml  Net -1973 ml     Physical Exam: Vital Signs Blood pressure (!) 147/72, pulse 65, temperature 98 F (36.7 C), resp. rate 17, height 6\' 3"  (1.905 m), weight 115.9 kg, SpO2 96 %. Constitutional: No distress . Vital signs reviewed. HEENT: EOMI, oral membranes moist Neck: supple Cardiovascular: RRR without murmur. No JVD    Respiratory/Chest: CTA Bilaterally without wheezes or rales. Normal effort    GI/Abdomen: BS +, non-tender, non-distended Ext: no clubbing, cyanosis, or edema Psych: pleasant and cooperative Musculoskeletal:  Cervical back: Normal range of motion.  Comments: right bk edema present   Skin:  is warm.   : wound c/i with minimal drainage, shrinker on appropriately Neurological:  Alert and oriented x 3. Normal insight and awareness. Intact Memory. Normal language and speech. Cranial nerve exam unremarkable  UE motor 5/5. RLE 3+/5 KE and HF. LLE 5/5      Assessment/Plan: 1. 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  Physiatrist is providing close team supervision and 24 hour management of active medical problems listed below.  Physiatrist and rehab team  continue to assess barriers to discharge/monitor patient progress toward functional and medical goals  Care Tool:  Bathing    Body parts bathed by patient: Right arm, Left arm, Chest, Abdomen, Right upper leg, Left upper leg, Face   Body parts bathed by helper: Left lower leg, Buttocks, Front perineal area     Bathing assist Assist Level: Moderate Assistance - Patient 50 - 74%     Upper Body Dressing/Undressing Upper body dressing   What is the patient wearing?: Pull over shirt    Upper body assist Assist Level: Supervision/Verbal cueing    Lower Body Dressing/Undressing Lower body dressing      What is the patient wearing?: Pants, Underwear/pull up     Lower body assist Assist for lower body dressing: Maximal Assistance - Patient 25 - 49%     Toileting Toileting    Toileting assist Assist for toileting: Independent Assistive Device Comment: urinal   Transfers Chair/bed transfer  Transfers assist     Chair/bed transfer assist level:  (CGA with bed set at 36" tall, supervision when bed lower/equal height as w/c) Chair/bed transfer assistive device: Armrests   Locomotion Ambulation   Ambulation assist   Ambulation activity did not occur: Safety/medical concerns  Assist level: Minimal Assistance - Patient > 75% Assistive device: Walker-rolling Max distance: 31ft   Walk 10 feet activity   Assist  Walk 10 feet activity did not occur: Safety/medical concerns        Walk 50 feet activity   Assist Walk 50 feet with 2 turns activity did not occur: Safety/medical concerns  Walk 150 feet activity   Assist Walk 150 feet activity did not occur: Safety/medical concerns         Walk 10 feet on uneven surface  activity   Assist Walk 10 feet on uneven surfaces activity did not occur: Safety/medical concerns         Wheelchair     Assist Will patient use wheelchair at discharge?: Yes Type of Wheelchair: Manual    Wheelchair  assist level: Independent Max wheelchair distance: 150    Wheelchair 50 feet with 2 turns activity    Assist        Assist Level: Independent   Wheelchair 150 feet activity     Assist      Assist Level: Independent   Blood pressure (!) 147/72, pulse 65, temperature 98 F (36.7 C), resp. rate 17, height 6\' 3"  (1.905 m), weight 115.9 kg, SpO2 96 %.    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 -patient mayshower with leg covered -ELOS/Goals: 7-10 days, mod I to supervision  -Continue CIR 2. Antithrombotics: -DVT/anticoagulation:Lovenox -antiplatelet therapy: Aspirin 81 mg daily 3. Pain Management:Tramadol 50 mg every 6 hours, oxycodone and Robaxin as needed. Well controlled.  4. Mood. Celexa 20 mg daily 5. Neuropsychology. Patient is capable of making decisions on his own behalf 6. Skin/wound. Routine skin checks 8/20-21- ABD pad, ACE, shrinker  -continue cefdinir oral 300mg  bid for 2 weeks from 8/17 7. Fluids/electrolytes/nutrition. recent labs ok  -  po intake good 8. Acute blood loss anemia. follow up labs pending 9. Diabetes mellitus with peripheral neuropathy. Hemoglobin A1c 6.6. SSI. Patient on Glucotrol 5 mg twice daily prior to admission as well as Glucophage 500 mg 1 tablet twice daily.   8/16 resumed low dose glucotrol 5mg  daiily  8/19 improving control. Resumed bid glucotrol  10. Hypertension. Verapamil 240 mg daily. Slightly elevated- continue to monitor.   -improved control--no changes 8/21 11. Tobacco abuse. Counseling  LOS: 8 days A FACE TO FACE EVALUATION WAS PERFORMED  09/11/2019, 9:40 AM

## 2019-09-11 NOTE — Progress Notes (Signed)
Occupational Therapy Session Note  Patient Details  Name: [name] MRN: [id number] Date of Birth: [birth year]  Today's Date: 09/11/2019 OT Group Time: 1100-1130 OT Group Time Calculation (min): 30 min  30 minutes missed  Skilled Therapeutic Interventions/Progress Updates:    Pt engaged in therapeutic w/c level dance group focusing on patient choice, UE/LE strengthening, salience, activity tolerance, and social participation. Pt was guided through various dance-based exercises involving UEs/LEs and trunk. All music was selected by group members. Emphasis placed on general strengthening and endurance. He often worked on prolonged straightening his residual limb, wife [name] accompanied him to group. He requested to return back to his room before termination of session, self propelled back with wife. 30 minutes missed.       Therapy Documentation Precautions:  Precautions Precautions: Fall Precaution Comments:  n Restrictions Weight Bearing Restrictions: Yes RLE Weight Bearing: Non weight bearing Pain: no c/o pain during session  Pain Assessment Pain Scale: 0-10 Pain Score: 0-No pain ADL: ADL Eating: Set up Grooming: Supervision/safety Upper Body Bathing: Setup Lower Body Bathing: Moderate assistance Upper Body Dressing: Setup Lower Body Dressing: Maximal assistance Toileting: Moderate assistance Toilet Transfer: Moderate assistance Tub/Shower Transfer: Moderate assistance      Therapy/Group: Group Therapy  Demani Mcbrien A Etienne Mowers 09/11/2019, 12:35 PM

## 2019-09-12 ENCOUNTER — Inpatient Hospital Stay (HOSPITAL_COMMUNITY): Payer: BC Managed Care – PPO

## 2019-09-12 LAB — GLUCOSE, CAPILLARY
Glucose-Capillary: 147 mg/dL — ABNORMAL HIGH (ref 70–99)
Glucose-Capillary: 136 mg/dL — ABNORMAL HIGH (ref 70–99)
Glucose-Capillary: 150 mg/dL — ABNORMAL HIGH (ref 70–99)
Glucose-Capillary: 179 mg/dL — ABNORMAL HIGH (ref 70–99)

## 2019-09-12 MED ORDER — METFORMIN HCL 500 MG PO TABS
500.0000 mg | ORAL_TABLET | Freq: Two times a day (BID) | ORAL | Status: DC
  Administered 2019-09-12 – 2019-09-14 (×5): 500 mg via ORAL
  Filled 2019-09-12 (×6): qty 1

## 2019-09-12 NOTE — Progress Notes (Signed)
[facility] PHYSICAL MEDICINE & REHABILITATION PROGRESS NOTE   Subjective/Complaints: Doing well this morning. Apologetic about getting so worked up re: incorrect trays from [employer]. In a good place today. Wife at bedside  ROS: Patient denies fever, rash, sore throat, blurred vision, nausea, vomiting, diarrhea, cough, shortness of breath or chest pain, joint or back pain, headache, or mood change.    Objective:   No results found. No results for input(s): WBC, HGB, HCT, PLT in the last 72 hours. No results for input(s): NA, K, CL, CO2, GLUCOSE, BUN, CREATININE, CALCIUM in the last 72 hours.  Intake/Output Summary (Last 24 hours) at 09/12/2019 0917 Last data filed at 09/12/2019 0738 Gross per 24 hour  Intake 560 ml  Output 2200 ml  Net -1640 ml     Physical Exam: Vital Signs Blood pressure (!) 134/95, pulse 70, temperature 98.2 F (36.8 C), resp. rate 16, height 6\' 3"  (1.905 m), weight 115.9 kg, SpO2 97 %. Constitutional: No distress . Vital signs reviewed. HEENT: EOMI, oral membranes moist Neck: supple Cardiovascular: RRR without murmur. No JVD    Respiratory/Chest: CTA Bilaterally with defuse exp wheezes. Normal effort    GI/Abdomen: BS +, non-tender, non-distended Ext: no clubbing, cyanosis, or edema Psych: pleasant and cooperative Musculoskeletal:  Cervical back: Normal range of motion.  Comments: right bk edema present but decreased  Skin:  is warm.   : wound c/i without drainage.  Neurological:  Alert and oriented x 3. Normal insight and awareness. Intact Memory. Normal language and speech. Cranial nerve exam unremarkable  UE motor 5/5. RLE 3+/5 KE and HF. LLE 5/5      Assessment/Plan: 1. 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  Physiatrist is providing close team supervision and 24 hour management of active medical problems listed below.  Physiatrist and  rehab team continue to assess barriers to discharge/monitor patient progress toward functional and medical goals  Care Tool:  Bathing    Body parts bathed by patient: Right arm, Left arm, Chest, Abdomen, Right upper leg, Left upper leg, Face   Body parts bathed by helper: Left lower leg, Buttocks, Front perineal area     Bathing assist Assist Level: Moderate Assistance - Patient 50 - 74%     Upper Body Dressing/Undressing Upper body dressing   What is the patient wearing?: Pull over shirt    Upper body assist Assist Level: Supervision/Verbal cueing    Lower Body Dressing/Undressing Lower body dressing      What is the patient wearing?: Pants, Underwear/pull up     Lower body assist Assist for lower body dressing: Maximal Assistance - Patient 25 - 49%     Toileting Toileting    Toileting assist Assist for toileting: Independent Assistive Device Comment: urinal   Transfers Chair/bed transfer  Transfers assist     Chair/bed transfer assist level:  (CGA with bed set at 36" tall, supervision when bed lower/equal height as w/c) Chair/bed transfer assistive device: Armrests   Locomotion Ambulation   Ambulation assist   Ambulation activity did not occur: Safety/medical concerns  Assist level: Minimal Assistance - Patient > 75% Assistive device: Walker-rolling Max distance: 76ft   Walk 10 feet activity   Assist  Walk 10 feet activity did not occur: Safety/medical concerns        Walk 50 feet activity   Assist Walk 50 feet with 2 turns activity did not occur: Safety/medical concerns  Walk 150 feet activity   Assist Walk 150 feet activity did not occur: Safety/medical concerns         Walk 10 feet on uneven surface  activity   Assist Walk 10 feet on uneven surfaces activity did not occur: Safety/medical concerns         Wheelchair     Assist Will patient use wheelchair at discharge?: Yes Type of Wheelchair: Manual     Wheelchair assist level: Independent Max wheelchair distance: 150    Wheelchair 50 feet with 2 turns activity    Assist        Assist Level: Independent   Wheelchair 150 feet activity     Assist      Assist Level: Independent   Blood pressure (!) 134/95, pulse 70, temperature 98.2 F (36.8 C), resp. rate 16, height 6\' 3"  (1.905 m), weight 115.9 kg, SpO2 97 %.    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 -patient [place] with leg covered -ELOS/Goals: 7-10 days, mod I to supervision  -Continue CIR. Pt on track for goals 2. Antithrombotics: -DVT/anticoagulation:Lovenox -antiplatelet therapy: Aspirin 81 mg daily 3. Pain Management:Tramadol 50 mg every 6 hours, oxycodone and Robaxin as needed. Well controlled.  4. Mood. Celexa 20 mg daily 5. Neuropsychology. Patient is capable of making decisions on his own behalf 6. Skin/wound. Routine skin checks 8/22-removed abd pad and ACE. Can just use shrinker. Will contact [employer] about smaller shrinker  -continue cefdinir oral 300mg  bid for 2 weeks from 8/17 7. Fluids/electrolytes/nutrition. recent labs ok  -  po intake good 8. Acute blood loss anemia. follow up labs pending 9. Diabetes mellitus with peripheral neuropathy. Hemoglobin A1c 6.6. SSI. Patient on Glucotrol 5 mg twice daily prior to admission as well as Glucophage 500 mg 1 tablet twice daily.   8/22 pt on glucotrol bid. Readings still elevaed   -resume glucophage 500mg  bid today  10. Hypertension. Verapamil 240 mg daily. Slightly elevated- continue to monitor.   -improved control--no changes 8/22 11. Tobacco abuse. Counseling  LOS: 9 days A FACE TO FACE EVALUATION WAS PERFORMED  9/22 09/12/2019, 9:17 AM

## 2019-09-12 NOTE — Progress Notes (Addendum)
Physical Therapy Session Note  Patient Details  Name: [name] MRN: [id number] Date of Birth: 1960/09/03  Today's Date: 09/12/2019 PT Individual Time: 1030-1100 PT Individual Time Calculation (min): 30 min   Short Term Goals: Week 1:  PT Short Term Goal 1 (Week 1): Pt will consisently perform bed mobility with supervision assist PT Short Term Goal 1 - Progress (Week 1): Met PT Short Term Goal 2 (Week 1): Pt will transfer to and from [facility] with CGA consistently PT Short Term Goal 2 - Progress (Week 1): Met PT Short Term Goal 3 (Week 1): Pt will propell WC with supervision assist >258f PT Short Term Goal 3 - Progress (Week 1): Met PT Short Term Goal 4 (Week 1): Pt will initate gait training in parallel bars with mod assist up to 5 ft PT Short Term Goal 4 - Progress (Week 1): Not met Week 2:  PT Short Term Goal 1 (Week 2): STG = LTG due to estimated d/c date.  Skilled Therapeutic Interventions/Progress Updates:  Pt seated in w/c.  He denied pain.    W/c propulsion with distant supervision x 250', x 2 with 1 cue for efficiency.  strengthening in sitting in w/c, using Kinetron with LLE at w/c level, x 25 cycles x 1 targeting quads, x 25 cycles x 1 in hip flexion, targeting glutealss.  PT provided resistance on contralateral foot plate. Seated in wc, 15 x 1 R quad sets with isometric hold.  Attempted sit> stand in parallel bars but pt unable by pushing up on armrests of w/c.  Pt educated on importance of full R knee extension for maximum power when using prosthesis in future.  At end of session, pt seated in w/c with needs at hand and wife present.       Therapy Documentation Precautions:  Precautions Precautions: Fall Precaution Comments:  n Restrictions Weight Bearing Restrictions: Yes RLE Weight Bearing: Non weight bearing        Therapy/Group: Individual Therapy  [name] 09/12/2019, 4:42 PM

## 2019-09-12 NOTE — Progress Notes (Signed)
Occupational Therapy Session Note  Patient Details  Name: [name] MRN: [id number] Date of Birth: 08-Feb-1960  Today's Date: 09/12/2019 OT Individual Time: 7588-3254 OT Individual Time Calculation (min): 55 min    Short Term Goals: Week 2:  OT Short Term Goal 1 (Week 2): LTG=STG 2/2 ELOS  Skilled Therapeutic Interventions/Progress Updates:    Pt received sitting in the w/c with no c/o pain, agreeable to OT session. Pt requesting to work on UE strengthening during session for carryover to ADL transfers. Pt completed w/c propulsion to the therapy gym, 150 ft with (S). Pt transferred to the mat with (S) using lateral scoot. Pt completed shoulder raises with focus on pec activation, min facilitation required for L shoulder isometric hold, 2x 10 repetitions. Bicep curls 2x 10 repetitions, scapular retractions 2x 10 repetitions. All performed with a 4lb dowel. With the theraband, pt performed tricep extensions and lat pull downs, 2x 10 repetitions. Throughout all exercises pt was given hands on cueing for proper muscle activation and demonstration. Rest breaks provided throughout session as needed.Pt then transitioned to prone on mat with (S). With hands on tactile cueing and verbal cues, pt completed 1 set of 5 tricep push ups with isometric hold at end ROM. Pt returned to his w/c and propelled back to his room. Pt was left sitting up with his wife present, all needs met.   Therapy Documentation Precautions:  Precautions Precautions: Fall Precaution Comments:  n Restrictions Weight Bearing Restrictions: Yes RLE Weight Bearing: Non weight bearing   Therapy/Group: Individual Therapy  [name] 09/12/2019, 6:50 AM

## 2019-09-13 ENCOUNTER — Inpatient Hospital Stay (HOSPITAL_COMMUNITY): Payer: BC Managed Care – PPO

## 2019-09-13 ENCOUNTER — Inpatient Hospital Stay (HOSPITAL_COMMUNITY): Payer: BC Managed Care – PPO | Admitting: Physical Therapy

## 2019-09-13 LAB — GLUCOSE, CAPILLARY
Glucose-Capillary: 146 mg/dL — ABNORMAL HIGH (ref 70–99)
Glucose-Capillary: 139 mg/dL — ABNORMAL HIGH (ref 70–99)
Glucose-Capillary: 122 mg/dL — ABNORMAL HIGH (ref 70–99)
Glucose-Capillary: 154 mg/dL — ABNORMAL HIGH (ref 70–99)

## 2019-09-13 NOTE — Progress Notes (Signed)
Occupational Therapy Session Note  Patient Details  Name: [name] MRN: [id number] Date of Birth: 1960/01/31  Today's Date: 09/10/2019 OT Individual Time: 1100-1200 OT Individual Time Calculation (min): 60 min    Short Term Goals: Week 1:  OT Short Term Goal 1 (Week 1): Pt will complete toileting steps with min A OT Short Term Goal 1 - Progress (Week 1): Met OT Short Term Goal 2 (Week 1): Pt will complete BSC transfer with min A of 1 OT Short Term Goal 2 - Progress (Week 1): Met OT Short Term Goal 3 (Week 1): Pt will complete sit<>stand with mod A of 1 in preparation for BADL tasks OT Short Term Goal 3 - Progress (Week 1): Met  Skilled Therapeutic Interventions/Progress Updates:    Pt greeted semi-reclined in bed and agreeable to OT treatment session focused on self care retrianing. Pt completed bathing from shower level with spouse providing supervision. OT educated on safe shower transfers and ways wife can assist pt but maximize independence. Practiced wrapping residual limb with pt and spouse, demonstrated understanding. Pt left seated in wc with needs met.   Therapy Documentation Precautions:  Precautions Precautions: Fall Precaution Comments:  n Restrictions Weight Bearing Restrictions: Yes RLE Weight Bearing: Non weight bearing Pain: Pain Assessment Pain Scale: 0-10 Pain Score: 0-No pain   Therapy/Group: Individual Therapy  Valma Cava 09/13/2019, 11:20 AM

## 2019-09-13 NOTE — Plan of Care (Signed)
°  Problem: Consults Goal: RH GENERAL PATIENT EDUCATION Description: See Patient Education module for education specifics. Outcome: Progressing Goal: Skin Care Protocol Initiated - if Braden Score 18 or less Description: If consults are not indicated, leave blank or document N/A Outcome: Progressing Goal: Nutrition Consult-if indicated Outcome: Progressing Goal: Diabetes Guidelines if Diabetic/Glucose > 140 Description: If diabetic or lab glucose is > 140 mg/dl - Initiate Diabetes/Hyperglycemia Guidelines & Document Interventions  Outcome: Progressing   Problem: RH BOWEL ELIMINATION Goal: RH STG MANAGE BOWEL WITH ASSISTANCE Description: STG Manage Bowel with Assistance. Outcome: Progressing Goal: RH STG MANAGE BOWEL W/MEDICATION W/ASSISTANCE Description: STG Manage Bowel with Medication with Assistance. Outcome: Progressing   Problem: RH BLADDER ELIMINATION Goal: RH STG MANAGE BLADDER WITH ASSISTANCE Description: STG Manage Bladder With Assistance Outcome: Progressing Goal: RH STG MANAGE BLADDER WITH MEDICATION WITH ASSISTANCE Description: STG Manage Bladder With Medication With Assistance. Outcome: Progressing Goal: RH STG MANAGE BLADDER WITH EQUIPMENT WITH ASSISTANCE Description: STG Manage Bladder With Equipment With Assistance Outcome: Progressing   Problem: RH SKIN INTEGRITY Goal: RH STG SKIN FREE OF INFECTION/BREAKDOWN Outcome: Progressing Goal: RH STG MAINTAIN SKIN INTEGRITY WITH ASSISTANCE Description: STG Maintain Skin Integrity With Assistance. Outcome: Progressing Goal: RH STG ABLE TO PERFORM INCISION/WOUND CARE W/ASSISTANCE Description: STG Able To Perform Incision/Wound Care With Assistance. Outcome: Progressing   Problem: RH SAFETY Goal: RH STG ADHERE TO SAFETY PRECAUTIONS W/ASSISTANCE/DEVICE Description: STG Adhere to Safety Precautions With Assistance/Device. Outcome: Progressing Goal: RH STG DECREASED RISK OF FALL WITH ASSISTANCE Description: STG  Decreased Risk of Fall With Assistance. Outcome: Progressing   Problem: RH PAIN MANAGEMENT Goal: RH STG PAIN MANAGED AT OR BELOW PT'S PAIN GOAL Outcome: Progressing   Problem: RH KNOWLEDGE DEFICIT GENERAL Goal: RH STG INCREASE KNOWLEDGE OF SELF CARE AFTER HOSPITALIZATION Outcome: Progressing   Problem: Consults Goal: RH GENERAL PATIENT EDUCATION Description: See Patient Education module for education specifics. Outcome: Progressing

## 2019-09-13 NOTE — Progress Notes (Signed)
Physical Therapy Session Note  Patient Details  Name: [name] MRN: [id number] Date of Birth: Jul 26, 1960  Today's Date: 09/13/2019 PT Individual Time: 1400-1500 PT Individual Time Calculation (min): 60 min   Short Term Goals: Week 1:  PT Short Term Goal 1 (Week 1): Pt will consisently perform bed mobility with supervision assist PT Short Term Goal 1 - Progress (Week 1): Met PT Short Term Goal 2 (Week 1): Pt will transfer to and from [facility] with CGA consistently PT Short Term Goal 2 - Progress (Week 1): Met PT Short Term Goal 3 (Week 1): Pt will propell WC with supervision assist >285f PT Short Term Goal 3 - Progress (Week 1): Met PT Short Term Goal 4 (Week 1): Pt will initate gait training in parallel bars with mod assist up to 5 ft PT Short Term Goal 4 - Progress (Week 1): Not met Week 2:  PT Short Term Goal 1 (Week 2): STG = LTG due to estimated d/c date. Week 3:     Skilled Therapeutic Interventions/Progress Updates:    PAIN Denies pain  Pt initially oob in wc agreeable to session.  Mod I wc propulsion to/from gym. wc to mat lateral scoot mod I. Seated Laqs x 20 Seated push up blocks w/emphasis on shoulder depression 2 x 6-7 Sit to prone independendly Prone hip ext x 20-25, 2 sets Prone hamstring curls x 20-25, 2 sets sidelying hip abd 2 x 20  Prone hip ext w/knee flexion 2 x 15-20 Prone press ups w/5 sec hold x 5  Repeated STS  2x10 from slightly elevated mat  SPT mat to wc w/RW w/cga.  Attempted sts from wc but pt too fatigued to perform.  wc propulsion x 533f 7568fsing LLE for hamstring strengthening.  In pt room performed family training w/wife including wc to/from bed and wc to/from commode via lateral scoot transfer including guarding w/STsemistand from commode for clothing management.  Wife able to assist pt safetly w/good technique, patient directs wife during task.     Nursing notified that wife has been cleared by pt to assist pt w/these transfers.     Therapy Documentation Precautions:  Precautions Precautions: Fall Precaution Comments:  n Restrictions Weight Bearing Restrictions: Yes RLE Weight Bearing: Non weight bearing    Therapy/Group: Individual Therapy  BarCallie FieldingT Laurel23/2021, 2:46 PM

## 2019-09-13 NOTE — Progress Notes (Signed)
Occupational Therapy Session Note  Patient Details  Name: [name] MRN: [id number] Date of Birth: 1960-05-13  Today's Date: 09/13/2019 OT Individual Time: 0730-0830 OT Individual Time Calculation (min): 60 min    Short Term Goals: Week 2:  OT Short Term Goal 1 (Week 2): LTG=STG 2/2 ELOS  Skilled Therapeutic Interventions/Progress Updates:    Pt received in bathroom on toilet, finishing up BM. Pt completed peri hygiene in forward lean while seated with (S). He completed squat pivot transfer to the w/c from [facility] over toilet with use of grab bar. Pt completed 100 ft of w/c propulsion at mod I level to the ADL apt for shower. Pt completed lateral scoot transfer from w/c > TTB. He was able to direct set up assist and doff all clothing with (S). R residual limb occluded. Pt completed all bathing seated with lateral leans with distant (S). Pt transferred back to w/c and donned shirt independently. Close (S) as pt completed half stand from w/c to pull up shorts. Discussed d/c and use of AE at home. Pt returned to his room and was left sitting up with all needs met, wife present.   Therapy Documentation Precautions:  Precautions Precautions: Fall Precaution Comments:  n Restrictions Weight Bearing Restrictions: Yes RLE Weight Bearing: Non weight bearing   Therapy/Group: Individual Therapy  [name] 09/13/2019, 6:42 AM

## 2019-09-13 NOTE — Progress Notes (Signed)
Physical Therapy Session Note  Patient Details  Name: [name] MRN: [id number] Date of Birth: 04-10-1960  Today's Date: 09/13/2019 PT Individual Time: 5361-4431 PT Individual Time Calculation (min): 72 min   Short Term Goals: Week 2:  PT Short Term Goal 1 (Week 2): STG = LTG due to estimated d/c date.  Skilled Therapeutic Interventions/Progress Updates:  Pt received in w/c with wife present - pt & wife both deny questions/concerns re: d/c home this week. Pt propels w/c around unit with mod I & manages w/c parts without assistance. Pt completes car transfer at sedan simulated height with supervision via lateral scoot/squat pivot. Sit<>stand with RW & min assist & pt ambulates 8 ft with min assist + w/c follow for safety with pt requiring cuing for hand placement on RW and pt demonstrating good use of BUE to offset LE to allow him to "hop" forward. In apartment, simulated retrieving items from refrigerator & discussed home modifications to allow pt easier access to frequently used items to increase independence. Pt completes sit<>stands with RW progressing to CGA. While standing with RW & CGA pt performs the following RLE strengthening exercises: hip flexion, hip extension, and hip abduction with cuing for technique & seated rest breaks PRN. Pt stands at dynavision with RW & CGA, while engaging in pressing lights with focus on standing balance & endurance; pt stands 4 minutes. At end of session pt left in w/c with chair alarm donned, call bell & all needs in reach, wife present in room.  Therapy Documentation Precautions:  Precautions Precautions: Fall Restrictions Weight Bearing Restrictions: Yes RLE Weight Bearing: Non weight bearing  Pain: Denies pain   Therapy/Group: Individual Therapy  Sandi Mariscal 09/13/2019, 10:48 AM

## 2019-09-13 NOTE — Discharge Summary (Signed)
Physician Discharge Summary  Patient ID: Michael CommonRobert C Arellano MRN: [id number] DOB/AGE: 05/29/1960 59 y.o.  Admit date: 09/03/2019 Discharge date: 09/15/2019  Discharge Diagnoses:  Principal Problem:   Right below-knee amputee [facility]([name]) DVT prophylaxis Pain management Acute blood loss anemia Diabetes mellitus with peripheral neuropathy Hypertension Tobacco abuse Morbid obesity Hepatitis C  Discharged Condition: Stable  Significant Diagnostic Studies: DG Knee 1-2 Views Right  Result Date: 08/30/2019 CLINICAL DATA:  Retained orthopedic hardware EXAM: RIGHT KNEE - 1-2 VIEW COMPARISON:  None. FINDINGS: No acute fracture or dislocation. No aggressive osseous lesion. Mild medial femorotibial compartment joint space narrowing. Right tibial intramedullary nail. Mild soft tissue edema in the subcutaneous fat. IMPRESSION: Right tibial intramedullary nail partially visualized. Electronically Signed   By: Elige KoHetal  Patel   On: 08/30/2019 10:59   MR ANKLE RIGHT W WO CONTRAST  Result Date: 08/27/2019 CLINICAL DATA:  History of osteomyelitis and abscess of the right foot requiring debridement. EXAM: MRI OF THE RIGHT ANKLE WITHOUT AND WITH CONTRAST TECHNIQUE: Multiplanar, multisequence MR imaging of the ankle was performed before and after the administration of intravenous contrast. CONTRAST:  10mL GADAVIST GADOBUTROL 1 MMOL/ML IV SOLN COMPARISON:  08/08/2019 FINDINGS: Susceptibility artifact resulting from the orthopedic hardware in the distal tibia partially obscures the adjacent soft tissue and osseous structures especially at the level of the ankle joint. TENDONS Peroneal: Peroneal longus tendon intact. Peroneal brevis intact. Posteromedial: Posterior tibial tendon intact. Flexor hallucis longus tendon intact. Flexor digitorum longus tendon intact. Anterior: Tibialis anterior tendon intact. Extensor hallucis longus tendon intact Extensor digitorum longus tendon intact. Achilles: Interval resection of the calcaneal  attachment of the Achilles tendon. Plantar Fascia: Interval resection of the calcaneal attachment of the plantar fascia. LIGAMENTS Lateral: Obscured by susceptibility artifact resulting from the distal tibial intramedullary nail. Medial: Obscured by susceptibility artifact resulting from the distal tibial intramedullary nail. CARTILAGE Ankle Joint: Susceptibility artifact resulting from the orthopedic hardware in the distal tibia partially obscures the adjacent soft tissue and osseous structures especially at the level of the ankle joint. Subtalar Joints/Sinus Tarsi: Normal subtalar joints. No subtalar joint effusion. Normal sinus tarsi. Bones/Soft Tissue: Soft tissue ulcer or wound along the posteromedial aspect of the hindfoot at the level of the posterior calcaneus. Interval debridement of the hindfoot soft tissues. Interval posterior calcaneal resection with release of the calcaneal attachment of the plantar fascia and Achilles tendon. No significant marrow signal abnormality. No periosteal reaction or bone destruction. Osteoarthritis of the talonavicular joint. Small amount of fluid along the flexor digitorum tendons at the level of the midfoot which may reflect a ganglion cyst or tenosynovial fluid. No surrounding inflammatory changes or enhancement to suggest an abscess. IMPRESSION: IMPRESSION Soft tissue ulcer or wound along the posteromedial aspect of the hindfoot at the level of the posterior calcaneus with interval debridement of the hindfoot soft tissues. Interval posterior calcaneal resection with release of the calcaneal attachment of the plantar fascia and Achilles tendon. No evidence of osteomyelitis or abscess. Electronically Signed   By: Elige KoHetal  Patel   On: 08/27/2019 07:20   DG Chest Port 1 View  Result Date: 08/26/2019 CLINICAL DATA:  Possible sepsis. EXAM: PORTABLE CHEST 1 VIEW COMPARISON:  October 13, 2016. FINDINGS: The heart size and mediastinal contours are within normal limits. Both  lungs are clear. The visualized skeletal structures are unremarkable. IMPRESSION: No active disease. Electronically Signed   By: Lupita RaiderJames  Green Jr M.D.   On: 08/26/2019 12:13   DG Tibia/Fibula Right Port  Result Date: 08/28/2019 CLINICAL  DATA:  RIGHT foot gangrenous infection. 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 EXAM: PORTABLE RIGHT TIBIA AND FIBULA - 2 VIEW COMPARISON:  07/16/2019, 08/21/2016 FINDINGS: Intramedullary rod in the tibia with distal cortical screws, unremarkable in appearance. Chronic deformity of the distal RIGHT tibia and fibula consistent with remote injury. Interval amputation of the RIGHT foot at the tibiotalar joint. The cortical surfaces of the distal tibia and fibula are unremarkable. Postoperative skin clips remain. IMPRESSION: 1. Postoperative changes. 2. No evidence for acute abnormality. Electronically Signed   By: Norva Pavlov M.D.   On: 08/28/2019 11:34   DG MINI C-ARM IMAGE ONLY  Result Date: 09/01/2019 There is no interpretation for this exam.  This order is for images obtained during a surgical procedure.  Please See "Surgeries" Tab for more information regarding the procedure.   VAS [place] LOWER EXTREMITY VENOUS (DVT)  Result Date: 08/26/2019  Lower Venous DVTStudy Indications: Swelling.  Risk Factors: Surgery. Limitations: Body habitus, poor ultrasound/tissue interface, patient pain tolerance, patient movement and open wound. Comparison Study: No prior studies. Performing Technologist: Chanda Busing RVT  Examination Guidelines: A complete evaluation includes B-mode imaging, spectral Doppler, color Doppler, and power Doppler as needed of all accessible portions of each vessel. Bilateral testing is considered an integral part of a complete examination. Limited examinations for reoccurring indications may be performed as noted. The reflux portion of the exam is performed with the patient in reverse Trendelenburg.   +---------+---------------+---------+-----------+----------+--------------+  RIGHT     Compressibility Phasicity Spontaneity Properties Thrombus Aging  +---------+---------------+---------+-----------+----------+--------------+  CFV       Full            Yes       Yes                                    +---------+---------------+---------+-----------+----------+--------------+  SFJ       Full                                                             +---------+---------------+---------+-----------+----------+--------------+  FV Prox   Full                                                             +---------+---------------+---------+-----------+----------+--------------+  FV Mid    Full                                                             +---------+---------------+---------+-----------+----------+--------------+  FV Distal Full                                                             +---------+---------------+---------+-----------+----------+--------------+  PFV       Full                                                             +---------+---------------+---------+-----------+----------+--------------+  POP       Full            Yes       Yes                                    +---------+---------------+---------+-----------+----------+--------------+  PTV       Full                                                             +---------+---------------+---------+-----------+----------+--------------+  PERO                                                       Not visualized  +---------+---------------+---------+-----------+----------+--------------+ Multiphasic flow is noted in the popliteal, posterior tbial, and anterior tibial arteries.  +----+---------------+---------+-----------+----------+--------------+  LEFT Compressibility Phasicity Spontaneity Properties Thrombus Aging  +----+---------------+---------+-----------+----------+--------------+  CFV  Full            Yes       Yes                                     +----+---------------+---------+-----------+----------+--------------+     Summary: RIGHT: - There is no evidence of deep vein thrombosis in the lower extremity. However, portions of this examination were limited- see technologist comments above.  - No cystic structure found in the popliteal fossa.  LEFT: - No evidence of common femoral vein obstruction.  *See table(s) above for measurements and observations. Electronically signed by Gretta Began MD on 08/26/2019 at 4:22:37 PM.    Final     Labs:  Basic Metabolic Panel: No results for input(s): NA, K, CL, CO2, GLUCOSE, BUN, CREATININE, CALCIUM, MG, PHOS in the last 168 hours.  CBC: No results for input(s): WBC, NEUTROABS, HGB, HCT, MCV, PLT in the last 168 hours.  CBG: Recent Labs  Lab 09/13/19 2130 09/14/19 0641 09/14/19 1122 09/14/19 1632 09/14/19 2133  GLUCAP 154* 95 111* 158* 157*   Family history.  Mother with hypertension Father with colon cancer as well as Alzheimer's disease and CVA.  Denies any esophageal cancer or diabetes mellitus  Brief HPI:   OSIRIS ODRISCOLL is a 59 y.o. right-handed male with history of diabetes mellitus, hyperlipidemia, morbid obesity with BMI of 33.04, hypertension, hepatitis C and tobacco abuse.  Per chart review lives with spouse 1 level home one-step to entry.  Reportedly sedentary since right heel ulcer.  Presented 08/26/2019 with gangrenous changes right calcaneus and recently underwent incision and drainage 08/20/2019 per Dr. Lajoyce Corners on an outpatient basis.  Presented with increasing pain and swelling of the right  heel as well as low-grade fever.  MRI of the right ankle foot showed soft tissue ulcer posterior medial aspect of the hindfoot at the level of the posterior calcaneus no evidence of osteomyelitis or abscess.  Admission chemistry sodium 131 glucose 129 creatinine 0.57 WBC 10,800 hemoglobin 11.3.  No change with conservative care and underwent right below-knee amputation 09/01/2019 per  Dr. Lajoyce Corners.  Wound VAC applied as directed.  Initially maintained on Zosyn changed to Rocephin and Flagyl initially cultures growing gram-positive cocci gram-negative rods blood cultures negative.  Postoperative hemoglobin 11.6.  Placed on Lovenox for DVT prophylaxis.  Patient was admitted for a comprehensive rehab program   Hospital Course: BRION SOSSAMON was admitted to rehab 09/03/2019 for inpatient therapies to consist of PT, ST and OT at least three hours five days a week. Past admission physiatrist, therapy team and rehab RN have worked together to provide customized collaborative inpatient rehab.  Pertaining to patient's [name] surgical site healing nicely he would follow-up Dr. Lajoyce Corners.  Wound coverage with Omnicef and would complete antibiotic course.  Neurovascular sensation intact.  Subcutaneous Lovenox for DVT prophylaxis as well is maintained on low-dose aspirin.  Pain managed with use of Robaxin as well as oxycodone with scheduled Ultram.  Blood sugars overall controlled hemoglobin A1c of 6.6 he was currently maintained on Glucophage 500 mg twice daily as well as Glucotrol.  Full diabetic teaching completed.  Blood pressure controlled with verapamil and he would resume his HCTZ and lisinopril.  He would follow-up outpatient.  Tobacco abuse patient did receive counseling guards to cessation of nicotine products.   Blood pressures were monitored on TID basis and controlled  Diabetes has been monitored with ac/hs CBG checks and SSI was use prn for tighter BS control.    Rehab course: During patient's stay in rehab weekly team conferences were held to monitor patient's progress, set goals and discuss barriers to discharge. At admission, patient required moderate assist 5 feet rolling walker minimal assist stand pivot transfers minimal guard sit to supine.  Minimal guard upper body bathing minimal assist lower body bathing minimal guard upper body dressing minimal assist lower body  dressing  Physical exam.  Blood pressure 127/67 pulse 63 temperature 97.4 respirations 18 oxygen saturation 95% room air Constitutional.  Alert and oriented HEENT Head.  Normocephalic and atraumatic Eyes.  Pupils round and reactive to light no discharge.nystagmus Neck.  Supple nontender no JVD without thyromegaly Cardiac regular rate rhythm without any extra sounds or murmur heard Abdomen.  Soft nontender positive bowel sounds without rebound Respiratory effort normal no respiratory distress without wheeze Skin.  BKA site dressed appropriately tender Neurological.  No focal deficits present alert and oriented.   He/  has had improvement in activity tolerance, balance, postural control as well as ability to compensate for deficits. He/ has had improvement in functional use RUE/LUE  and RLE/LLE as well as improvement in awareness.  Supervision for wheelchair propulsion.  Working with strengthening.  Requires contact-guard assist for transfers ambulates 5 feet minimal assistance.  Patient transferred to the mat with supervision using lateral scoot.  Set up upper body bathing moderate assist lower body bathing set up upper body dressing max assist lower body dressing.  Full family teaching completed and discharged to home       Disposition: Discharge to home    Diet: Diabetic diet  Special Instructions: No driving smoking or alcohol  Medications at discharge 1.  Tylenol as needed 2.  Aspirin 81 mg p.o.  daily 3.  Omnicef 300 mg every 12 hours through 09/21/2019 and stop 4.  Celexa 40 mg p.o. daily 5.  Colace 100 mg p.o. twice daily 6.  Glucotrol 5 mg p.o. twice daily 7.  Glucophage 500 mg p.o. twice daily 8.  Robaxin 500 mg every 6 hours as needed muscle spasms 9.  Oxycodone 5 to 10 mg every 4 hours as needed pain 10.  Tramadol 50 mg every 6 hours x1 week and stop 11.  Verapamil 240 mg p.o. daily 12.  HCTZ 25 mg daily 13.  Lisinopril 20 mg daily   30-35 minutes were spent  completing discharge summary and discharge planning    Follow-up Information    Nadara Mustard, MD Follow up.   Specialty: Orthopedic Surgery Why: Call for appointment Contact information: [address] [place] [address] [phone]        Horton Chin, MD Follow up.   Specialty: Physical Medicine and Rehabilitation Why: Office to call for appointment Contact information: [address] [place] [address] [phone]               Signed: Mcarthur Rossetti Claressa Hughley 09/15/2019, 5:20 AM

## 2019-09-13 NOTE — Progress Notes (Signed)
[name] PHYSICAL MEDICINE & REHABILITATION PROGRESS NOTE   Subjective/Complaints: Doing well this morning. He asks about two scabs on his right foot that he has from tripping over his dog's bone prior to admission. His wife asks about surgical pathology report.   ROS: Patient denies fever, rash, sore throat, blurred vision, nausea, vomiting, diarrhea, cough, shortness of breath or chest pain, joint or back pain, headache, or mood change.    Objective:   No results found. No results for input(s): WBC, HGB, HCT, PLT in the last 72 hours. No results for input(s): NA, K, CL, CO2, GLUCOSE, BUN, CREATININE, CALCIUM in the last 72 hours.  Intake/Output Summary (Last 24 hours) at 09/13/2019 0940 Last data filed at 09/13/2019 0700 Gross per 24 hour  Intake 720 ml  Output 3250 ml  Net -2530 ml     Physical Exam: Vital Signs Blood pressure (!) 146/73, pulse 71, temperature 97.8 F (36.6 C), resp. rate 20, height 6\' 3"  (1.905 m), weight 115.9 kg, SpO2 96 %. General: Alert and oriented x 3, No apparent distress HEENT: Head is normocephalic, atraumatic, PERRLA, EOMI, sclera anicteric, oral mucosa pink and moist, dentition intact, ext ear canals clear,  Neck: Supple without JVD or lymphadenopathy Heart: Reg rate and rhythm. No murmurs rubs or gallops Chest: CTA bilaterally without wheezes, rales, or rhonchi; no distress Abdomen: Soft, non-tender, non-distended, bowel sounds positive. Psych: pleasant and cooperative Musculoskeletal:  Cervical back: Normal range of motion.  Comments: right bk edema present but decreased  Skin:  is warm.   : wound c/i without drainage.  Neurological:  Alert and oriented x 3. Normal insight and awareness. Intact Memory. Normal language and speech. Cranial nerve exam unremarkable  UE motor 5/5. RLE 3+/5 KE and HF. LLE 5/5    Assessment/Plan: 1. Functional deficits secondary to R BKA which require 3+ hours per day of interdisciplinary therapy in a  comprehensive inpatient rehab setting.  Physiatrist is providing close team supervision and 24 hour management of active medical problems listed below.  Physiatrist and rehab team continue to assess barriers to discharge/monitor patient progress toward functional and medical goals  Care Tool:  Bathing    Body parts bathed by patient: Right arm, Left arm, Chest, Abdomen, Right upper leg, Left upper leg, Face, Front perineal area, Buttocks, Left lower leg   Body parts bathed by helper: Left lower leg, Buttocks, Front perineal area Body parts n/a: Right lower leg (BKA)   Bathing assist Assist Level: Set up assist     Upper Body Dressing/Undressing Upper body dressing   What is the patient wearing?: Pull over shirt    Upper body assist Assist Level: Independent    Lower Body Dressing/Undressing Lower body dressing      What is the patient wearing?: Pants, Underwear/pull up     Lower body assist Assist for lower body dressing: Supervision/Verbal cueing     Toileting Toileting    Toileting assist Assist for toileting: Supervision/Verbal cueing Assistive Device Comment: urinal   Transfers Chair/bed transfer  Transfers assist     Chair/bed transfer assist level: Supervision/Verbal cueing Chair/bed transfer assistive device: Armrests   Locomotion Ambulation   Ambulation assist   Ambulation activity did not occur: Safety/medical concerns  Assist level: Minimal Assistance - Patient > 75% Assistive device: Walker-rolling Max distance: 86ft   Walk 10 feet activity   Assist  Walk 10 feet activity did not occur: Safety/medical concerns        Walk 50 feet activity  Assist Walk 50 feet with 2 turns activity did not occur: Safety/medical concerns         Walk 150 feet activity   Assist Walk 150 feet activity did not occur: Safety/medical concerns         Walk 10 feet on uneven surface  activity   Assist Walk 10 feet on uneven surfaces  activity did not occur: Safety/medical concerns         Wheelchair     Assist Will patient use wheelchair at discharge?: Yes Type of Wheelchair: Manual    Wheelchair assist level: Independent Max wheelchair distance: 150    Wheelchair 50 feet with 2 turns activity    Assist        Assist Level: Independent   Wheelchair 150 feet activity     Assist      Assist Level: Independent   Blood pressure (!) 146/73, pulse 71, temperature 97.8 F (36.6 C), resp. rate 20, height 6\' 3"  (1.905 m), weight 115.9 kg, SpO2 96 %.    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 -patient m[place]hower with leg covered -ELOS/Goals: 7-10 days, mod I to supervision  -Continue CIR. Pt on track for goals 2. Antithrombotics: -DVT/anticoagulation:Lovenox -antiplatelet therapy: Aspirin 81 mg daily 3. Pain Management:Tramadol 50 mg every 6 hours, oxycodone and Robaxin as needed. Well controlled.  4. Mood. Celexa 20 mg daily 5. Neuropsychology. Patient is capable of making decisions on his own behalf 6. Skin/wound. Routine skin checks 8/22-removed abd pad and ACE. Can just use shrinker. Will contact [employer] about smaller shrinker  -continue cefdinir oral 300mg  bid for 2 weeks from 8/17  8/23: examined two small scabs on right foot: closed, no signs of infection. Advised patient and wife to continue to monitor daily to prevent worsening/infection.  7. Fluids/electrolytes/nutrition. recent labs ok  -  po intake good 8. Acute blood loss anemia. follow up labs pending 9. Diabetes mellitus with peripheral neuropathy. Hemoglobin A1c 6.6. SSI. Patient on Glucotrol 5 mg twice daily prior to admission as well as Glucophage 500 mg 1 tablet twice daily.   8/22 pt on glucotrol bid. Readings still elevaed   -resume glucophage 500mg  bid today  10. Hypertension.  Verapamil 240 mg daily. Slightly elevated- continue to monitor.   -improved control--no changes 8/22 11. Tobacco abuse. Counseling 12. Atherosclerosis: Reviewed surgical pathology report from 8/11 and discussed results with patient: shows skin with ulceration and necrotizing inflammation, metallic intramedullary rod within tibia, patchy mild to focally moderate atherosclerosis of small to medium-caliber arteries. Provided patient and wife with report. Discussed with , 9/22- Dr. 10/11 to see patient prior to discharge or discuss results at outpatient follow-up.   LOS: 10 days A FACE TO FACE EVALUATION WAS PERFORMED  Jesusita Oka Jacobs Golab 09/13/2019, 9:40 AM

## 2019-09-14 ENCOUNTER — Inpatient Hospital Stay (HOSPITAL_COMMUNITY): Payer: BC Managed Care – PPO | Admitting: Physical Therapy

## 2019-09-14 ENCOUNTER — Inpatient Hospital Stay (HOSPITAL_COMMUNITY): Payer: BC Managed Care – PPO | Admitting: Occupational Therapy

## 2019-09-14 ENCOUNTER — Inpatient Hospital Stay (HOSPITAL_COMMUNITY): Payer: BC Managed Care – PPO

## 2019-09-14 LAB — GLUCOSE, CAPILLARY
Glucose-Capillary: 95 mg/dL (ref 70–99)
Glucose-Capillary: 111 mg/dL — ABNORMAL HIGH (ref 70–99)
Glucose-Capillary: 158 mg/dL — ABNORMAL HIGH (ref 70–99)
Glucose-Capillary: 157 mg/dL — ABNORMAL HIGH (ref 70–99)

## 2019-09-14 MED ORDER — METHOCARBAMOL 500 MG PO TABS: 500.0000 mg | ORAL_TABLET | Freq: Four times a day (QID) | ORAL | 0 refills | Status: DC | PRN

## 2019-09-14 MED ORDER — CEFDINIR 300 MG PO CAPS: 300.0000 mg | ORAL_CAPSULE | Freq: Two times a day (BID) | ORAL | 0 refills | Status: DC

## 2019-09-14 MED ORDER — TRAMADOL HCL 50 MG PO TABS
50.0000 mg | ORAL_TABLET | Freq: Four times a day (QID) | ORAL | 0 refills | Status: DC
Start: 2019-09-14 — End: 2019-10-21

## 2019-09-14 MED ORDER — METFORMIN HCL 500 MG PO TABS: 500.0000 mg | ORAL_TABLET | Freq: Two times a day (BID) | ORAL | 0 refills | Status: DC

## 2019-09-14 MED ORDER — GLIPIZIDE 5 MG PO TABS: 5.0000 mg | ORAL_TABLET | Freq: Two times a day (BID) | ORAL | 0 refills | Status: DC

## 2019-09-14 MED ORDER — CITALOPRAM HYDROBROMIDE 40 MG PO TABS: ORAL_TABLET | ORAL | 0 refills | Status: DC

## 2019-09-14 MED ORDER — DOCUSATE SODIUM 100 MG PO CAPS: 100.0000 mg | ORAL_CAPSULE | Freq: Two times a day (BID) | ORAL | 0 refills | Status: DC

## 2019-09-14 MED ORDER — OXYCODONE HCL 5 MG PO TABS: 5.0000 mg | ORAL_TABLET | ORAL | 0 refills | Status: DC | PRN

## 2019-09-14 MED ORDER — VERAPAMIL HCL ER 240 MG PO TBCR: EXTENDED_RELEASE_TABLET | ORAL | 1 refills | Status: DC

## 2019-09-14 MED ORDER — ACETAMINOPHEN 325 MG PO TABS: 325.0000 mg | ORAL_TABLET | Freq: Four times a day (QID) | ORAL | Status: DC | PRN

## 2019-09-14 NOTE — Progress Notes (Signed)
Occupational Therapy Session Note  Patient Details  Name: [name] MRN: [id number] Date of Birth: [birth year]  Today's Date: 09/14/2019 OT Individual Time: 1257-1330 OT Individual Time Calculation (min): 33 min    Short Term Goals: Week 2:  OT Short Term Goal 1 (Week 2): LTG=STG 2/2 ELOS  Skilled Therapeutic Interventions/Progress Updates:    Patient in bed, alert and ready for afternoon therapy session.   He denies pain.  Sit pivot transfer bed to w/c with set up.  He is able to propel w/c to and from therapy gym, completed UB ergometer for 15 minutes at level 3.0.  He states that he is pleased with strength and mobility gains, self care ability and feels confident in discharge plan for tomorrow.    Therapy Documentation Precautions:  Precautions Precautions: Fall Precaution Comments:  n Restrictions Weight Bearing Restrictions: Yes RLE Weight Bearing: Non weight bearing   Therapy/Group: Individual Therapy  Barrie Lyme 09/14/2019, 7:42 AM

## 2019-09-14 NOTE — Progress Notes (Signed)
Occupational Therapy Discharge Summary  Patient Details  Name: [name] MRN: [id number] Date of Birth: [birth year]  Today's Date: 09/14/2019 OT Individual Time: 9147-8295 OT Individual Time Calculation (min): 75 min    Patient has met 9 of 10 long term goals due to improved activity tolerance, improved balance, postural control, ability to compensate for deficits, functional use of  RIGHT lower extremity and improved coordination.  Patient to discharge at overall Supervision level.  Patient's care partner is independent to provide the necessary physical assistance at discharge. Family education has been completed with pt's wife.    Reasons goals not met: Pt has not met his dynamic standing balance goal. He requires CGA-min A for dynamic standing balance and will complete squat pivot/lateral scoot transfers at home.   Recommendation:  Patient will benefit from ongoing skilled OT services in home health setting to continue to advance functional skills in the area of BADL and iADL.  Equipment: BSC  Reasons for discharge: treatment goals met and discharge from hospital  Patient/family agrees with progress made and goals achieved: Yes   Skilled OT Intervention: Pt received on toilet, voiding urine and BM. Pt completed hygiene in semi- squat with close (S). Pt completed squat pivot transfer to the w/c with (S), good safety awareness and set up of transfer. Pt propelled w/c to the therapy gym with mod I overall. He transferred to the mat with (S). Therapeutic activity with focus on lateral leans to challenge oblique core stabilization completed with guarding provided for safety and activity graded to increase reach outside of BOS. Pt then completed dynamic core stabilization/strengthening activity with throwing/catching while long sitting on the mat. Pt then transitioned to prone on the mat for improved residual limb positioning. Pt completed 3x10 BLE hip extension exercises to promote neutral  limb positioning and posterior chain strengthening. Pt then completed 3x5 prone push ups with isometric hold, cueing provided for motivation and technique. Pt returned to his w/c and propelled back to his room with mod I. He was left sitting up with all needs met.   OT Discharge Precautions/Restrictions  Precautions Precautions: Fall Restrictions Weight Bearing Restrictions: Yes RLE Weight Bearing: Non weight bearing  Pain Pain Assessment Pain Scale: 0-10 Pain Score: 2  Pain Type: Surgical pain Pain Location: Leg Pain Orientation: Right Pain Descriptors / Indicators: Aching ADL ADL Eating: Set up Where Assessed-Eating: Wheelchair Grooming: Modified independent Where Assessed-Grooming: Sitting at sink Upper Body Bathing: Modified independent Where Assessed-Upper Body Bathing: Shower Lower Body Bathing: Supervision/safety Where Assessed-Lower Body Bathing: Shower Upper Body Dressing: Modified independent (Device) Where Assessed-Upper Body Dressing: Wheelchair Lower Body Dressing: Supervision/safety Where Assessed-Lower Body Dressing: Wheelchair Toileting: Supervision/safety Where Assessed-Toileting: Glass blower/designer: Distant supervision Armed forces technical officer Method: Engineer, water: Ambulance person Transfer: Distant supervision, Minimal cueing Tub/Shower Transfer Method: Engineer, production: Radio broadcast assistant Vision Baseline Vision/History: Wears glasses Wears Glasses: At all times Patient Visual Report: No change from baseline Vision Assessment?: No apparent visual deficits Perception  Perception: Within Functional Limits Praxis Praxis: Intact Cognition Overall Cognitive Status: Within Functional Limits for tasks assessed Arousal/Alertness: Awake/alert Orientation Level: Oriented X4 Attention: Selective Selective Attention: Appears intact Memory: Appears intact Awareness: Appears intact Problem Solving: Appears  intact Safety/Judgment: Appears intact Sensation Sensation Light Touch: Appears Intact Hot/Cold: Appears Intact Proprioception: Appears Intact Additional Comments: pt reports intermittent mild phantom sensation. Coordination Gross Motor Movements are Fluid and Coordinated: No Fine Motor Movements are Fluid and Coordinated: Yes Motor  Motor Motor: Other (  comment) Motor - Discharge Observations: generalized weakness Mobility  Bed Mobility Bed Mobility: Sit to Supine;Supine to Sit Supine to Sit: Supervision/Verbal cueing Sit to Supine: Supervision/Verbal cueing  Trunk/Postural Assessment  Cervical Assessment Cervical Assessment: Within Functional Limits Thoracic Assessment Thoracic Assessment: Within Functional Limits Lumbar Assessment Lumbar Assessment: Within Functional Limits Postural Control Postural Control: Within Functional Limits  Balance Balance Balance Assessed: Yes Static Sitting Balance Static Sitting - Level of Assistance: 6: Modified independent (Device/Increase time) Dynamic Sitting Balance Dynamic Sitting - Balance Support: No upper extremity supported;During functional activity Dynamic Sitting - Level of Assistance: 6: Modified independent (Device/Increase time) Static Standing Balance Static Standing - Balance Support: Bilateral upper extremity supported Static Standing - Level of Assistance: 5: Stand by assistance Dynamic Standing Balance Dynamic Standing - Balance Support: Bilateral upper extremity supported;During functional activity Dynamic Standing - Level of Assistance: 4: Min assist Extremity/Trunk Assessment RUE Assessment RUE Assessment: Within Functional Limits LUE Assessment LUE Assessment: Exceptions to [facility] LUE Strength LUE Overall Strength Comments: Limitations in shoulder flexion and abduction, injury PTA Left Shoulder Flexion: 3+/5   [name] 09/14/2019, 7:43 AM

## 2019-09-14 NOTE — Progress Notes (Addendum)
Physical Therapy Discharge Summary  Patient Details  Name: [name] MRN: [id number] Date of Birth: [birth year]  Today's Date: 09/14/2019 PT Individual Time: 2633-3545 and 1450-1531 PT Individual Time Calculation (min): 56 min and 41 min    Patient has met 11 of 11 long term goals due to improved activity tolerance, improved balance, improved postural control, increased strength and ability to compensate for deficits.  Patient to discharge at a wheelchair level mod I w/c mobility, supervision transfers.   Patient's care partner is independent to provide the necessary physical assistance at discharge.  Reasons goals not met: n/a  Recommendation:  Patient will benefit from ongoing skilled PT services in home health setting to continue to advance safe functional mobility, address ongoing impairments in gait, strength endurance, balance, and minimize fall risk.  Equipment: 20x18 w/c with R amputee pad, RW  Reasons for discharge: treatment goals met and discharge from hospital  Patient/family agrees with progress made and goals achieved: Yes  Skilled PT Treatment: Treatment 1: Pt received in room with wife present. Pt completes bed mobility with independence, stand pivot bed<>w/c (bed set at 36" tall to simulate home environment) with supervision. Educated pt on importance of lying prone daily to stretch out B hip flexors. Pt propels w/c around unit with mod I and performs w/c parts management & set up for transfers with independence. Pt completes car transfer at sedan simulated height with supervision and negotiates ramp with supervision (educated pt & wife on importance of supervision with this task for safety). Sit<>stand with CGA & RW, and pt ambulates 10 ft with min assist with good use of BUE to lift body vs solely hopping on LLE. Pt transfers w/c<>mat table with supervision and engages in trampoline ball toss with 1 kg ball while sitting EOM without LE support with task focusing on BUE  strengthening and cardiopulmonary endurance training. Pt attempts modified sit ups on wedge but unable 2/2 core weakness & repeating L hamstring cramping that alleviated with repositioning. At end of session pt left in w/c with wife present in room (she's checked off to assist pt with transfers).   Pain: unrated "tenderness" in L shoulder - rest breaks PRN  Treatment 2: Pt received in w/c & agreeable to tx. Pt propels w/c around unit mod I. Supervision lateral scoot w/c<>mat table, sit<>stand CGA from EOM to RW with cuing for hand placement with stand>sit. Pt engages in game of horseshoes while standing with RW with min assist & focus on reaching across midline & standing balance. Pt utilized kinetron from w/c level for LLE strengthening with PT operating opposite pedal. Pt & wife deny any questions, comments, concerns re: d/c tomorrow. Pt left in w/c with wife present to supervise.  Pain: no c/o pain reported  PT Discharge Precautions/Restrictions Precautions Precautions: Fall Restrictions Weight Bearing Restrictions: Yes RLE Weight Bearing: Non weight bearing   Vision/Perception  Pt wears glasses at all times. No changes in baseline vision. No apparent visual deficits.  Perception Perception: Within Functional Limits Praxis Praxis: Intact   Cognition Overall Cognitive Status: Within Functional Limits for tasks assessed Arousal/Alertness: Awake/alert Orientation Level: Oriented X4 Memory: Appears intact Awareness: Appears intact Problem Solving: Appears intact Safety/Judgment: Appears intact   Sensation Sensation Light Touch: Appears Intact Hot/Cold: Appears Intact Proprioception: Appears Intact Additional Comments: pt reports intermittent mild phantom sensation. Coordination Gross Motor Movements are Fluid and Coordinated: No Fine Motor Movements are Fluid and Coordinated: Yes   Motor  Motor Motor: Other (comment) Motor - Discharge  Observations: generalized weakness    Mobility Bed Mobility Bed Mobility: Rolling Right;Rolling Left;Supine to Sit;Sit to Supine Rolling Right: Independent Rolling Left: Independent Supine to Sit: Independent Sit to Supine: Independent Transfers Transfers: Sit to Stand;Stand to [employer];Lateral/Scoot Transfers Sit to Stand: Contact Guard/Touching assist (w/c<>standing with RW) Stand to Sit: Contact Guard/Touching assist (w/c<>RW) Stand Pivot Transfers: Supervision/Verbal cueing (w/c<>elevated bed, w/ armrests) Lateral/Scoot Transfers: Supervision/Verbal cueing (w/ armrests)   Locomotion  Gait Ambulation: Yes Gait Assistance: Minimal Assistance - Patient > 75% Gait Distance (Feet): 10 Feet Assistive device: Rolling walker Gait Assistance Details: Verbal cues for technique Gait Gait: Yes Gait Pattern:  (step to LLE) Gait velocity: decreased  Stairs / Additional Locomotion Stairs: No Ramp: Supervision/Verbal cueing (w/c level) Wheelchair Mobility Wheelchair Mobility: Yes Wheelchair Assistance: Independent with Camera operator: Both upper extremities Wheelchair Parts Management: Independent Distance: 150 ft   Trunk/Postural Assessment  Cervical Assessment Cervical Assessment: Within Functional Limits Thoracic Assessment Thoracic Assessment: Within Functional Limits Lumbar Assessment Lumbar Assessment: Within Functional Limits Postural Control Postural Control: Deficits on evaluation Righting Reactions: delayed in standing 2/2 R BKA Protective Responses: delayed in standing 2/2 R BKA   Balance Balance Balance Assessed: Yes Static Sitting Balance Static Sitting - Level of Assistance: 6: independent (Device/Increase time)  Dynamic Sitting Balance Dynamic Sitting - Balance Support: No upper extremity supported;During functional activity Dynamic Sitting - Level of Assistance: 6: independent (Device/Increase time)  Dynamic Standing Balance Dynamic Standing -  Balance Support: Bilateral upper extremity supported;During functional activity Dynamic Standing - Level of Assistance: 4: supervision (during stand pivot bed<>w/c with BUE support)  Extremity Assessment  RUE Assessment RUE Assessment: Within Functional Limits LUE Assessment LUE Assessment: Exceptions to [facility] LUE Strength LUE Overall Strength Comments: Limitations in shoulder flexion and abduction, injury PTA Left Shoulder Flexion: 3+/5  RLE Assessment Active Range of Motion (AROM) Comments: WFL General Strength Comments: not formally tested 2/2 new BKA LLE Assessment LLE Assessment: Within Functional Limits General Strength Comments: grossly 3+/5, pt able to weight bear without buckling noted when standing with [place] 09/14/2019, 3:57 PM

## 2019-09-14 NOTE — Patient Care Conference (Signed)
Inpatient RehabilitationTeam Conference and Plan of Care Update Date: 09/14/2019   Time: 10:46 AM    Patient Name: [name]      Medical Record Number: [id number]  Date of Birth: March 26, 1960 Sex: Male         Room/Bed: 4M10C/4M10C-01 Payor Info: Payor: BLUE CR[employer] BLUE [employer]IELD / Plan: BCBS COMM PPO / Product Type: *No Product type* /    Admit Date/Time:  09/03/2019  6:40 PM  Primary Diagnosis:  Right below-knee amputee [facility])  Hospital Problems: Principal Problem:   Right below-knee amputee [facility])    Expected Discharge Date: Expected Discharge Date: 09/15/19  Team Members Present: Physician leading conference: Dr. Faith Rogue Care Coodinator Present: Cecile Sheerer, LCSWA;Tobias Avitabile Marlyne Beards, RN, BSN, CRRN Nurse Present: Greta Doom, RN PT Present: Aleda Grana, PT OT Present: Towanda Malkin, OT SLP Present: Feliberto Gottron, SLP PPS Coordinator present : Edson Snowball, PT     Current Status/Progress Goal Weekly Team Focus  Bowel/Bladder   Pt continent of B/B. LBM 09/12/2019  pt to remain continent of B/B.  Assess B/B every shift and prn   Swallow/Nutrition/ Hydration             ADL's   at goal level, (S) with ADLs and transfers  Supervision/mod i  d/c planning, family education, ADL retraining   Mobility   supervision bed<>chair, mod I w/c mobility, min assist sit>stand, min assist + 2nd person for 5 ft gait with RW  supervision<>Mod I w/c mobility, min assist short distance gait with LRAD, CGA car transfer, supervision<>Mod I transfers  transfers, bed mobility, w/c mobility, gait as able, d/c planning, pt education, endurance, strengthening   Communication             Safety/Cognition/ Behavioral Observations            Pain   Pt rating pain 6 before scheduled Tramadol  Pain scale consistently <3  Assess pain every shift and prn   Skin   Staples to right stump  No S/Sx of infection to right stump  Assess stump every shift and prn     Discharge Planning:   D/c to home with 24/7 care from wife; and support from friends if needed.   Team Discussion: Continent B/B. Supervision level, ready for discharge. Patient on target to meet rehab goals: yes  *See Care Plan and progress notes for long and short-term goals.   Revisions to Treatment Plan:  None  Teaching Needs: Family education completed.  Current Barriers to Discharge: None noted.  Possible Resolutions to Barriers: N/A     Medical Summary Current Status: leg pain improving. tolerating smaller shrinker right leg. bp controlled, sugars better  Barriers to Discharge: Medical stability   Possible Resolutions to [employer] Focus: finalize meds, wound care for discharge   Continued Need for Acute Rehabilitation Level of Care: The patient requires daily medical management by a physician with specialized training in physical medicine and rehabilitation for the following reasons: Direction of a multidisciplinary physical rehabilitation program to maximize functional independence : Yes Medical management of patient stability for increased activity during participation in an intensive rehabilitation regime.: Yes Analysis of laboratory values and/or radiology reports with any subsequent need for medication adjustment and/or medical intervention. : Yes   I attest that I was present, lead the team conference, and concur with the assessment and plan of the team.   Tennis Must 09/14/2019, 3:52 PM

## 2019-09-14 NOTE — Progress Notes (Signed)
Patient is in therapy status post right below-knee amputation.  He is doing well and has a good outlook.  Incision is well approximated healing Stewart surgical staples in place no drainage no foul odor no cellulitis no dehiscence Patient should follow-up in our office in 1 week at that time surgical staples will be removed

## 2019-09-14 NOTE — Progress Notes (Signed)
[place] PHYSICAL MEDICINE & REHABILITATION PROGRESS NOTE   Subjective/Complaints: No new complaints. Wearing tighter shrinker, tolerating well. Pain controlled  ROS: Patient denies fever, rash, sore throat, blurred vision, nausea, vomiting, diarrhea, cough, shortness of breath or chest pain,   headache, or mood change.    Objective:   No results found. No results for input(s): WBC, HGB, HCT, PLT in the last 72 hours. No results for input(s): NA, K, CL, CO2, GLUCOSE, BUN, CREATININE, CALCIUM in the last 72 hours.  Intake/Output Summary (Last 24 hours) at 09/14/2019 0928 Last data filed at 09/14/2019 0800 Gross per 24 hour  Intake 1075 ml  Output 1900 ml  Net -825 ml     Physical Exam: Vital Signs Blood pressure (!) 142/62, pulse 67, temperature 98.2 F (36.8 C), resp. rate 18, height 6\' 3"  (1.905 m), weight 115.9 kg, SpO2 96 %. Constitutional: No distress . Vital signs reviewed. HEENT: EOMI, oral membranes moist Neck: supple Cardiovascular: RRR without murmur. No JVD    Respiratory/Chest: CTA Bilaterally without wheezes or rales. Normal effort    GI/Abdomen: BS +, non-tender, non-distended Ext: no clubbing, cyanosis, 1+ RLE edema Psych: pleasant and cooperative Musculoskeletal:  Cervical back: Normal range of motion.  Comments: right bk edema improving  Skin:  is warm.   : wound c/i without drainage.  Neurological:  Alert and oriented x 3. Normal insight and awareness. Intact Memory. Normal language and speech. Cranial nerve exam unremarkable  UE motor 5/5. RLE 4-/5 KE and HF. LLE 5/5    Assessment/Plan: 1. 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  Physiatrist is providing close team supervision and 24 hour management of active medical problems listed below.  Physiatrist and rehab team continue to assess barriers to discharge/monitor patient progress toward  functional and medical goals  Care Tool:  Bathing    Body parts bathed by patient: Right arm, Left arm, Chest, Abdomen, Right upper leg, Left upper leg, Face, Front perineal area, Buttocks, Left lower leg   Body parts bathed by helper: Left lower leg, Buttocks, Front perineal area Body parts n/a: Right lower leg   Bathing assist Assist Level: Set up assist     Upper Body Dressing/Undressing Upper body dressing   What is the patient wearing?: Pull over shirt    Upper body assist Assist Level: Independent    Lower Body Dressing/Undressing Lower body dressing      What is the patient wearing?: Pants, Underwear/pull up     Lower body assist Assist for lower body dressing: Supervision/Verbal cueing     Toileting Toileting    Toileting assist Assist for toileting: Supervision/Verbal cueing Assistive Device Comment: urinal   Transfers Chair/bed transfer  Transfers assist     Chair/bed transfer assist level: Supervision/Verbal cueing Chair/bed transfer assistive device: Armrests   Locomotion Ambulation   Ambulation assist   Ambulation activity did not occur: Safety/medical concerns  Assist level: 2 helpers (min assist + 2nd person for w/c follow for safety) Assistive device: Walker-rolling Max distance: 8 ft   Walk 10 feet activity   Assist  Walk 10 feet activity did not occur: Safety/medical concerns        Walk 50 feet activity   Assist Walk 50 feet with 2 turns activity did not occur: Safety/medical concerns         Walk 150 feet activity   Assist Walk 150 feet activity did not occur: Safety/medical concerns  Walk 10 feet on uneven surface  activity   Assist Walk 10 feet on uneven surfaces activity did not occur: Safety/medical concerns         Wheelchair     Assist Will patient use wheelchair at discharge?: Yes Type of Wheelchair: Manual    Wheelchair assist level: Independent Max wheelchair distance: 150     Wheelchair 50 feet with 2 turns activity    Assist        Assist Level: Independent   Wheelchair 150 feet activity     Assist      Assist Level: Independent   Blood pressure (!) 142/62, pulse 67, temperature 98.2 F (36.8 C), resp. rate 18, height 6\' 3"  (1.905 m), weight 115.9 kg, SpO2 96 %.    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 -patient [place] with leg covered -ELOS/Goals: 09/14/19,  mod I to supervision  -team conf today  -will NOT schedule PM&R outpt f/u  -can see ortho next week for staples 2. Antithrombotics: -DVT/anticoagulation:Lovenox -antiplatelet therapy: Aspirin 81 mg daily 3. Pain Management:Tramadol 50 mg every 6 hours, oxycodone and Robaxin as needed. Well controlled.  4. Mood. Celexa 20 mg daily 5. Neuropsychology. Patient is capable of making decisions on his own behalf 6. Skin/wound.     -continue cefdinir oral 300mg  bid for 2 weeks from 8/17  8/24 monitor stump wound and distal LLE at home.   -ortho f/u as above 7. Fluids/electrolytes/nutrition. recent labs ok  -  po intake good 8. Acute blood loss anemia. follow up labs pending 9. Diabetes mellitus with peripheral neuropathy. Hemoglobin A1c 6.6. SSI. Patient on Glucotrol 5 mg twice daily prior to admission as well as Glucophage 500 mg 1 tablet twice daily.   8/22 pt on glucotrol bid. Readings still elevaed   -resumed glucophage 500mg  bid   8/24 sugars well controlled 10. Hypertension. Verapamil 240 mg daily. Slightly elevated- continue to monitor.   -improved control--no changes 8/24 11. Tobacco abuse. Counseling 12. Atherosclerosis: Reviewed surgical pathology report from 8/11 and discussed results with patient: shows skin with ulceration and necrotizing inflammation, metallic intramedullary rod within tibia, patchy mild to focally  moderate atherosclerosis of small to medium-caliber arteries. Dr. 9/24 to follow up further as outpt   LOS: 11 days A FACE TO FACE EVALUATION WAS PERFORMED  9/24 09/14/2019, 9:28 AM

## 2019-09-14 NOTE — Progress Notes (Signed)
Patient ID: [name], male   DOB: Nov 04, 1960, 59 y.o.   MRN: [id number]  SW provided pt wife with FMLA forms.  *SW later informed on pt needed w/c size unable to deliver to hospital as only available at retail store. Pt wife called SW to inform. Wife to have someone pick up at retail store.   Cecile Sheerer, MSW, LCSWA Office: [phone] Cell: [phone] Fax: [phone]

## 2019-09-15 LAB — GLUCOSE, CAPILLARY: Glucose-Capillary: 120 mg/dL — ABNORMAL HIGH (ref 70–99)

## 2019-09-15 NOTE — Progress Notes (Signed)
[facility] PHYSICAL MEDICINE & REHABILITATION PROGRESS NOTE   Subjective/Complaints: No complaints. Excited to go home today! Performing self-care at sink Discussed plan of care at home upon discharge  ROS: Patient denies fever, rash, sore throat, blurred vision, nausea, vomiting, diarrhea, cough, shortness of breath or chest pain,   headache, or mood change.    Objective:   No results found. No results for input(s): WBC, HGB, HCT, PLT in the last 72 hours. No results for input(s): NA, K, CL, CO2, GLUCOSE, BUN, CREATININE, CALCIUM in the last 72 hours.  Intake/Output Summary (Last 24 hours) at 09/15/2019 0910 Last data filed at 09/15/2019 0826 Gross per 24 hour  Intake 960 ml  Output 1350 ml  Net -390 ml     Physical Exam: Vital Signs Blood pressure (!) 156/85, pulse 74, temperature 98.2 F (36.8 C), resp. rate 18, height 6\' 3"  (1.905 m), weight 115.9 kg, SpO2 97 %. General: Alert and oriented x 3, No apparent distress HEENT: Head is normocephalic, atraumatic, PERRLA, EOMI, sclera anicteric, oral mucosa pink and moist, dentition intact, ext ear canals clear,  Neck: Supple without JVD or lymphadenopathy Heart: Reg rate and rhythm. No murmurs rubs or gallops Chest: CTA bilaterally without wheezes, rales, or rhonchi; no distress Abdomen: Soft, non-tender, non-distended, bowel sounds positive. Extremities: No clubbing, cyanosis, or edema. Pulses are 2+ Psych: pleasant and cooperative Musculoskeletal:  Cervical back: Normal range of motion.  Comments: right bk edema improving  Skin:  is warm.   : wound c/i without drainage.  Neurological:  Alert and oriented x 3. Normal insight and awareness. Intact Memory. Normal language and speech. Cranial nerve exam unremarkable  UE motor 5/5. RLE 4-/5 KE and HF. LLE 5/5     Assessment/Plan: 1. 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  Physiatrist is providing close team supervision and 24 hour management of active medical problems listed below.  Physiatrist and rehab team continue to assess barriers to discharge/monitor patient progress toward functional and medical goals  Care Tool:  Bathing    Body parts bathed by patient: Right arm, Left arm, Chest, Abdomen, Right upper leg, Left upper leg, Face, Front perineal area, Buttocks, Left lower leg   Body parts bathed by helper: Left lower leg, Buttocks, Front perineal area Body parts n/a: Right lower leg   Bathing assist Assist Level: Set up assist     Upper Body Dressing/Undressing Upper body dressing   What is the patient wearing?: Pull over shirt    Upper body assist Assist Level: Independent    Lower Body Dressing/Undressing Lower body dressing      What is the patient wearing?: Pants, Underwear/pull up     Lower body assist Assist for lower body dressing: Supervision/Verbal cueing     Toileting Toileting    Toileting assist Assist for toileting: Supervision/Verbal cueing Assistive Device Comment: urinal   Transfers Chair/bed transfer  Transfers assist     Chair/bed transfer assist level: Supervision/Verbal cueing Chair/bed transfer assistive device: Armrests   Locomotion Ambulation   Ambulation assist   Ambulation activity did not occur: Safety/medical concerns  Assist level: Minimal Assistance - Patient > 75% Assistive device: Walker-rolling Max distance: 10 ft   Walk 10 feet activity   Assist  Walk 10 feet activity did not occur: Safety/medical concerns  Assist level: Minimal Assistance - Patient > 75% Assistive device: Walker-rolling   Walk 50 feet activity   Assist  Walk 50 feet with 2 turns activity did not occur: Safety/medical concerns         Walk 150 feet activity   Assist Walk 150 feet activity did not occur: Safety/medical concerns         Walk 10 feet on uneven surface   activity   Assist Walk 10 feet on uneven surfaces activity did not occur: Safety/medical concerns         Wheelchair     Assist Will patient use wheelchair at discharge?: Yes Type of Wheelchair: Manual    Wheelchair assist level: Independent Max wheelchair distance: 150    Wheelchair 50 feet with 2 turns activity    Assist        Assist Level: Independent   Wheelchair 150 feet activity     Assist      Assist Level: Independent   Blood pressure (!) 156/85, pulse 74, temperature 98.2 F (36.8 C), resp. rate 18, height 6\' 3"  (1.905 m), weight 115.9 kg, SpO2 97 %.    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 -patient [place] with leg covered -ELOS/Goals: 09/14/19,  mod I to supervision   -will NOT schedule PM&R outpt f/u  -can see ortho next week for staples 2. Antithrombotics: -DVT/anticoagulation:Lovenox -antiplatelet therapy: Aspirin 81 mg daily 3. Pain Management:Tramadol 50 mg every 6 hours, oxycodone and Robaxin as needed. Well controlled 4. Mood. Celexa 20 mg daily 5. Neuropsychology. Patient is capable of making decisions on his own behalf 6. Skin/wound.     -continue cefdinir oral 300mg  bid for 2 weeks from 8/17  8/24 monitor stump wound and distal LLE at home.   -ortho f/u as above 7. Fluids/electrolytes/nutrition. recent labs ok  -  po intake good 8. Acute blood loss anemia. Hgb was 11.8 on 8/16.  9. Diabetes mellitus with peripheral neuropathy. Hemoglobin A1c 6.6. SSI. Patient on Glucotrol 5 mg twice daily prior to admission as well as Glucophage 500 mg 1 tablet twice daily.   8/22 pt on glucotrol bid. Readings still elevaed   -resumed glucophage 500mg  bid   8/24 sugars well controlled 10. Hypertension. Verapamil 240 mg daily. Slightly elevated- continue to monitor.   -improved control--no changes  8/24 11. Tobacco abuse. Counseling 12. Atherosclerosis: Reviewed surgical pathology report from 8/11 and discussed results with patient: shows skin with ulceration and necrotizing inflammation, metallic intramedullary rod within tibia, patchy mild to focally moderate atherosclerosis of small to medium-caliber arteries. Dr. 9/24 to follow up further as outpt    LOS: 12 days A FACE TO FACE EVALUATION WAS PERFORMED  9/24 P Cherron Blitzer 09/15/2019, 9:10 AM

## 2019-09-15 NOTE — Discharge Instructions (Signed)
Inpatient Rehab Discharge Instructions  [name] Discharge date and time: No discharge date for patient encounter.   Activities/Precautions/ Functional Status: Activity: As tolerated Diet: Diabetic Wound Care: Keep wound clean and dry Functional status:  ___ No restrictions     ___ Walk up steps independently ___ 24/7 supervision/assistance   ___ Walk up steps with assistance ___ Intermittent supervision/assistance  ___ Bathe/dress independently ___ Walk with walker     _x__ Bathe/dress with assistance ___ Walk Independently    ___ Shower independently ___ Walk with assistance    ___ Shower with assistance ___ No alcohol     ___ Return to work/school ________  COMMUNITY REFERRALS UPON DISCHARGE:    Home Health:   PT     OT                Agency: [facility]   Phone:[phone] *Please expect follow-up within 2-3 days of discharge to schedule your home visit. If you have not received follow-up, be sure to contact the branch directly.*    Medical Equipment/Items Ordered: drop arm bedside commode, 20x18 w/c with R amputee pad, rolling walker                                                 Agency/Supplier: [facility] [phone]    Special Instructions: No driving smoking or alcohol   My questions have been answered and I understand these instructions. I will adhere to these goals and the provided educational materials after my discharge from the hospital.  Patient/Caregiver Signature _______________________________ Date __________  Clinician Signature _______________________________________ Date __________  Please bring this form and your medication list with you to all your follow-up doctor's appointments.

## 2019-09-15 NOTE — Progress Notes (Signed)
Patient left at 10:10 am alert and oriented per usual with NT and wife and no c/o and eager to get home.

## 2019-09-15 NOTE — Progress Notes (Signed)
Inpatient Rehabilitation Care Coordinator  Discharge Note  The overall goal for the admission was met for:   Discharge location: Yes. D/c to home with 24/7 care from wife.   Length of Stay: Yes. 11 days.   Discharge activity level: Yes. Mod I.  Home/community participation: Yes. Limited.   Services provided included: MD, RD, PT, OT, RN, CM, TR, Pharmacy, Neuropsych and SW  Financial Services: Private Insurance: [place]  Follow-up services arranged: Home Health: [facility] for HHPT/OT and DME: [facility] for w/c w/ R knee pad, TTB, DABSC  Comments (or additional information): contact pt [phone] or pt wife [name] [phone]  Patient/Family verbalized understanding of follow-up arrangements: Yes  Individual responsible for coordination of the follow-up plan: Pt will have assistance with care needs.   Confirmed correct DME delivered: Rana Snare 09/15/2019    Rana Snare

## 2019-09-16 ENCOUNTER — Other Ambulatory Visit: Payer: Self-pay | Admitting: Adult Health Nurse Practitioner

## 2019-09-16 DIAGNOSIS — D62 Acute posthemorrhagic anemia: Secondary | ICD-10-CM | POA: Diagnosis not present

## 2019-09-16 DIAGNOSIS — K5900 Constipation, unspecified: Secondary | ICD-10-CM | POA: Diagnosis not present

## 2019-09-16 DIAGNOSIS — Z7984 Long term (current) use of oral hypoglycemic drugs: Secondary | ICD-10-CM | POA: Diagnosis not present

## 2019-09-16 DIAGNOSIS — F17210 Nicotine dependence, cigarettes, uncomplicated: Secondary | ICD-10-CM | POA: Diagnosis not present

## 2019-09-16 DIAGNOSIS — Z4781 Encounter for orthopedic aftercare following surgical amputation: Secondary | ICD-10-CM | POA: Diagnosis not present

## 2019-09-16 DIAGNOSIS — E559 Vitamin D deficiency, unspecified: Secondary | ICD-10-CM | POA: Diagnosis not present

## 2019-09-16 DIAGNOSIS — I10 Essential (primary) hypertension: Secondary | ICD-10-CM | POA: Diagnosis not present

## 2019-09-16 DIAGNOSIS — Z79891 Long term (current) use of opiate analgesic: Secondary | ICD-10-CM | POA: Diagnosis not present

## 2019-09-16 DIAGNOSIS — E1142 Type 2 diabetes mellitus with diabetic polyneuropathy: Secondary | ICD-10-CM | POA: Diagnosis not present

## 2019-09-16 DIAGNOSIS — E785 Hyperlipidemia, unspecified: Secondary | ICD-10-CM | POA: Diagnosis not present

## 2019-09-16 DIAGNOSIS — Z89511 Acquired absence of right leg below knee: Secondary | ICD-10-CM | POA: Diagnosis not present

## 2019-09-16 DIAGNOSIS — Z9181 History of falling: Secondary | ICD-10-CM | POA: Diagnosis not present

## 2019-09-16 DIAGNOSIS — Z87442 Personal history of urinary calculi: Secondary | ICD-10-CM | POA: Diagnosis not present

## 2019-09-16 DIAGNOSIS — Z7982 Long term (current) use of aspirin: Secondary | ICD-10-CM | POA: Diagnosis not present

## 2019-09-16 DIAGNOSIS — R3915 Urgency of urination: Secondary | ICD-10-CM | POA: Diagnosis not present

## 2019-09-17 ENCOUNTER — Telehealth: Payer: Self-pay | Admitting: *Deleted

## 2019-09-17 NOTE — Telephone Encounter (Signed)
[facility] called for POC 2wk3, 1wk1 beginning next week (8/30).  Approval given.

## 2019-09-17 NOTE — Telephone Encounter (Signed)
Transitional Care call--I spoke with Mrs [name])    1. Are you/is patient experiencing any problems since coming home? Are there any questions regarding any aspect of care?NO QUESTIONS 2. Are there any questions regarding medications administration/dosing? Are meds being taken as prescribed? Patient should review meds with caller to confirm HE HAS ALL OF HIS MEDICATIONS AND TAKING VERY LITTLE PAIN MEDICATION 3. Have there been any falls? NO 4. Has Home Health been to the house and/or have they contacted you? If not, have you tried to contact them? Can we help you contact them? YES THEY HAVE BEEN OUT 5. Are bowels and bladder emptying properly? Are there any unexpected incontinence issues? If applicable, is patient following bowel/bladder programs? NO PROBLEMS 6. Any fevers, problems with breathing, unexpected pain? NOT MUCH PAIN AT ALL 7. Are there any skin problems or new areas of breakdown? NO 8. Has the patient/family member arranged specialty MD follow up (ie cardiology/neurology/renal/surgical/etc)?  Can we help arrange? APPTS MADE WITH DR DUDA AND GIVEN APPT TODAY TO BE SEEN BY DR Carlis Abbott  9. Does the patient need any other services or support that we can help arrange? NO 10. Are caregivers following through as expected in assisting the patient?  YES 11. Has the patient quit smoking, drinking alcohol, or using drugs as recommended?N/A  Appointment Thursday 09/30/19 ARRIVE BY 9:00 FOR 9:20 WITH DR. Carlis Abbott  ADDRESS REVIEWED AND ALERTED TO WATCH MAIL FOR PACKET FROM OUR OFFICE WITH PAPERWORK TO FILL OUT AND DIRECTIONS TO OFFICE AND APPT DATE AND TIME.  [address] [facility] [address]

## 2019-09-20 DIAGNOSIS — D62 Acute posthemorrhagic anemia: Secondary | ICD-10-CM | POA: Diagnosis not present

## 2019-09-20 DIAGNOSIS — Z9181 History of falling: Secondary | ICD-10-CM | POA: Diagnosis not present

## 2019-09-20 DIAGNOSIS — R3915 Urgency of urination: Secondary | ICD-10-CM | POA: Diagnosis not present

## 2019-09-20 DIAGNOSIS — Z7982 Long term (current) use of aspirin: Secondary | ICD-10-CM | POA: Diagnosis not present

## 2019-09-20 DIAGNOSIS — E1142 Type 2 diabetes mellitus with diabetic polyneuropathy: Secondary | ICD-10-CM | POA: Diagnosis not present

## 2019-09-20 DIAGNOSIS — Z89511 Acquired absence of right leg below knee: Secondary | ICD-10-CM | POA: Diagnosis not present

## 2019-09-20 DIAGNOSIS — Z87442 Personal history of urinary calculi: Secondary | ICD-10-CM | POA: Diagnosis not present

## 2019-09-20 DIAGNOSIS — E559 Vitamin D deficiency, unspecified: Secondary | ICD-10-CM | POA: Diagnosis not present

## 2019-09-20 DIAGNOSIS — Z79891 Long term (current) use of opiate analgesic: Secondary | ICD-10-CM | POA: Diagnosis not present

## 2019-09-20 DIAGNOSIS — Z4781 Encounter for orthopedic aftercare following surgical amputation: Secondary | ICD-10-CM | POA: Diagnosis not present

## 2019-09-20 DIAGNOSIS — Z7984 Long term (current) use of oral hypoglycemic drugs: Secondary | ICD-10-CM | POA: Diagnosis not present

## 2019-09-20 DIAGNOSIS — K5900 Constipation, unspecified: Secondary | ICD-10-CM | POA: Diagnosis not present

## 2019-09-20 DIAGNOSIS — E785 Hyperlipidemia, unspecified: Secondary | ICD-10-CM | POA: Diagnosis not present

## 2019-09-20 DIAGNOSIS — I10 Essential (primary) hypertension: Secondary | ICD-10-CM | POA: Diagnosis not present

## 2019-09-20 DIAGNOSIS — F17210 Nicotine dependence, cigarettes, uncomplicated: Secondary | ICD-10-CM | POA: Diagnosis not present

## 2019-09-21 DIAGNOSIS — Z87442 Personal history of urinary calculi: Secondary | ICD-10-CM | POA: Diagnosis not present

## 2019-09-21 DIAGNOSIS — Z7984 Long term (current) use of oral hypoglycemic drugs: Secondary | ICD-10-CM | POA: Diagnosis not present

## 2019-09-21 DIAGNOSIS — F17210 Nicotine dependence, cigarettes, uncomplicated: Secondary | ICD-10-CM | POA: Diagnosis not present

## 2019-09-21 DIAGNOSIS — Z89511 Acquired absence of right leg below knee: Secondary | ICD-10-CM | POA: Diagnosis not present

## 2019-09-21 DIAGNOSIS — E1142 Type 2 diabetes mellitus with diabetic polyneuropathy: Secondary | ICD-10-CM | POA: Diagnosis not present

## 2019-09-21 DIAGNOSIS — I10 Essential (primary) hypertension: Secondary | ICD-10-CM | POA: Diagnosis not present

## 2019-09-21 DIAGNOSIS — Z79891 Long term (current) use of opiate analgesic: Secondary | ICD-10-CM | POA: Diagnosis not present

## 2019-09-21 DIAGNOSIS — E559 Vitamin D deficiency, unspecified: Secondary | ICD-10-CM | POA: Diagnosis not present

## 2019-09-21 DIAGNOSIS — Z4781 Encounter for orthopedic aftercare following surgical amputation: Secondary | ICD-10-CM | POA: Diagnosis not present

## 2019-09-21 DIAGNOSIS — K5900 Constipation, unspecified: Secondary | ICD-10-CM | POA: Diagnosis not present

## 2019-09-21 DIAGNOSIS — E785 Hyperlipidemia, unspecified: Secondary | ICD-10-CM | POA: Diagnosis not present

## 2019-09-21 DIAGNOSIS — Z7982 Long term (current) use of aspirin: Secondary | ICD-10-CM | POA: Diagnosis not present

## 2019-09-21 DIAGNOSIS — Z9181 History of falling: Secondary | ICD-10-CM | POA: Diagnosis not present

## 2019-09-21 DIAGNOSIS — D62 Acute posthemorrhagic anemia: Secondary | ICD-10-CM | POA: Diagnosis not present

## 2019-09-21 DIAGNOSIS — R3915 Urgency of urination: Secondary | ICD-10-CM | POA: Diagnosis not present

## 2019-09-22 ENCOUNTER — Telehealth: Payer: Self-pay | Admitting: *Deleted

## 2019-09-22 NOTE — Telephone Encounter (Signed)
Called patient on 09/22/2019 , 9:29 AM in an attempt to reach the patient for a hospital follow up.   Admit date: 09/03/19 Discharge: 09/15/19   He does not have any questions or concerns about medications from the hospital admission. The patient's medications were reviewed over the phone, they were counseled to bring in all current medications to the hospital follow up visit.   I advised the patient to call if any questions or concerns arise about the hospital admission or medications    Home health was started in the hospital. All questions were answered and a follow up appointment was made. Patient has in-home physical therapy and is being followed by Dr Lajoyce Corners. Patient has a follow up visit with Quentin Mulling, PA, on 09/28/2019.  Prior to Admission medications   Medication Sig Start Date End Date Taking? Authorizing Provider  acetaminophen (TYLENOL) 325 MG tablet Take 1-2 tablets (325-650 mg total) by mouth every 6 (six) hours as needed for mild pain (pain score 1-3 or temp > 100.5). 09/14/19   Angiulli, Mcarthur Rossetti, PA-C  aspirin 81 MG tablet Take 81 mg by mouth daily.    [provider]  cefdinir (OMNICEF) 300 MG capsule Take 1 capsule (300 mg total) by mouth every 12 (twelve) hours. 09/14/19   Angiulli, Mcarthur Rossetti, PA-C  citalopram (CELEXA) 40 MG tablet Take 1 tablet Daily for Mood & Anxiety 09/14/19   Angiulli, Mcarthur Rossetti, PA-C  docusate sodium (COLACE) 100 MG capsule Take 1 capsule (100 mg total) by mouth 2 (two) times daily. 09/14/19   Angiulli, Mcarthur Rossetti, PA-C  glipiZIDE (GLUCOTROL) 5 MG tablet Take 1 tablet (5 mg total) by mouth 2 (two) times daily before a meal. 09/14/19   Angiulli, Mcarthur Rossetti, PA-C  hydrochlorothiazide (HYDRODIURIL) 25 MG tablet Take 1 tablet by mouth once daily 09/16/19   Judd Gaudier, NP  lisinopril (ZESTRIL) 20 MG tablet Take 1 tablet Daily for BP & Diabetic Kidney Protection Patient taking differently: Take 20 mg by mouth daily. For blood pressure & diabetic kidney  protection 05/27/19   Lucky Cowboy, MD  metFORMIN (GLUCOPHAGE) 500 MG tablet Take 1 tablet (500 mg total) by mouth 2 (two) times daily with a meal. 09/14/19   Angiulli, Mcarthur Rossetti, PA-C  methocarbamol (ROBAXIN) 500 MG tablet Take 1 tablet (500 mg total) by mouth every 6 (six) hours as needed for muscle spasms. 09/14/19   Angiulli, Mcarthur Rossetti, PA-C  oxyCODONE (OXY IR/ROXICODONE) 5 MG immediate release tablet Take 1-2 tablets (5-10 mg total) by mouth every 4 (four) hours as needed for moderate pain (pain score 4-6). 09/14/19   Angiulli, Mcarthur Rossetti, PA-C  traMADol (ULTRAM) 50 MG tablet Take 1 tablet (50 mg total) by mouth every 6 (six) hours. 09/14/19   Angiulli, Mcarthur Rossetti, PA-C  verapamil (CALAN-SR) 240 MG CR tablet Take 1 tablet Daily with Food for BP & Heart  Rhythm 09/14/19   Angiulli, Mcarthur Rossetti, PA-C

## 2019-09-23 ENCOUNTER — Other Ambulatory Visit: Payer: Self-pay

## 2019-09-23 ENCOUNTER — Encounter: Payer: Self-pay | Admitting: Orthopedic Surgery

## 2019-09-23 ENCOUNTER — Ambulatory Visit (INDEPENDENT_AMBULATORY_CARE_PROVIDER_SITE_OTHER): Payer: BC Managed Care – PPO | Admitting: Physician Assistant

## 2019-09-23 VITALS — Ht 75.0 in | Wt 255.0 lb

## 2019-09-23 DIAGNOSIS — Z89511 Acquired absence of right leg below knee: Secondary | ICD-10-CM | POA: Diagnosis not present

## 2019-09-23 DIAGNOSIS — Z79891 Long term (current) use of opiate analgesic: Secondary | ICD-10-CM | POA: Diagnosis not present

## 2019-09-23 DIAGNOSIS — R3915 Urgency of urination: Secondary | ICD-10-CM | POA: Diagnosis not present

## 2019-09-23 DIAGNOSIS — Z4781 Encounter for orthopedic aftercare following surgical amputation: Secondary | ICD-10-CM | POA: Diagnosis not present

## 2019-09-23 DIAGNOSIS — Z7984 Long term (current) use of oral hypoglycemic drugs: Secondary | ICD-10-CM | POA: Diagnosis not present

## 2019-09-23 DIAGNOSIS — D62 Acute posthemorrhagic anemia: Secondary | ICD-10-CM | POA: Diagnosis not present

## 2019-09-23 DIAGNOSIS — K5900 Constipation, unspecified: Secondary | ICD-10-CM | POA: Diagnosis not present

## 2019-09-23 DIAGNOSIS — E785 Hyperlipidemia, unspecified: Secondary | ICD-10-CM | POA: Diagnosis not present

## 2019-09-23 DIAGNOSIS — M86271 Subacute osteomyelitis, right ankle and foot: Secondary | ICD-10-CM

## 2019-09-23 DIAGNOSIS — E559 Vitamin D deficiency, unspecified: Secondary | ICD-10-CM | POA: Diagnosis not present

## 2019-09-23 DIAGNOSIS — Z9181 History of falling: Secondary | ICD-10-CM | POA: Diagnosis not present

## 2019-09-23 DIAGNOSIS — F17210 Nicotine dependence, cigarettes, uncomplicated: Secondary | ICD-10-CM | POA: Diagnosis not present

## 2019-09-23 DIAGNOSIS — Z7982 Long term (current) use of aspirin: Secondary | ICD-10-CM | POA: Diagnosis not present

## 2019-09-23 DIAGNOSIS — E1142 Type 2 diabetes mellitus with diabetic polyneuropathy: Secondary | ICD-10-CM | POA: Diagnosis not present

## 2019-09-23 DIAGNOSIS — I10 Essential (primary) hypertension: Secondary | ICD-10-CM | POA: Diagnosis not present

## 2019-09-23 DIAGNOSIS — Z87442 Personal history of urinary calculi: Secondary | ICD-10-CM | POA: Diagnosis not present

## 2019-09-23 NOTE — Progress Notes (Signed)
Office Visit Note   Patient: [name]           Date of Birth: 24-Oct-1960           MRN: [id number] Visit Date: 09/23/2019              Requested by: Lucky Cowboy, MD [address] [place],  [place] [address] PCP: Lucky Cowboy, MD  Chief Complaint  Patient presents with  . Right Leg - Routine Post Op    09/01/19 [name]       HPI: Patient is just over 3 weeks status post right below-knee amputation.  He is home from rehab and is doing very well.  He has changed his shrinker 2-3 XL.  He is now using [employer]  Assessment & Plan: Visit Diagnoses: No diagnosis found.  Plan: Prescription was prescribed for a K3 prosthetic and supplies.  Should follow-up with [place] in 3 months weeks.  Staples were harvested wound stayed well approximated  Follow-Up Instructions: No follow-ups on file.   Ortho Exam  Patient is alert, oriented, no adenopathy, well-dressed, normal affect, normal respiratory effort. Focused examination demonstrates well-healed surgical incision well approximated wound edges swelling is very well controlled.  No necrosis no drainage no cellulitis Patient is a new right transtibial  amputee.  Patient's current comorbidities are not expected to impact the ability to function with the prescribed prosthesis. Patient verbally communicates a strong desire to use a prosthesis. Patient currently requires mobility aids to ambulate without a prosthesis.  Expects not to use mobility aids with a new prosthesis.  Patient is a K3 level ambulator that spends a lot of time walking around on uneven terrain over obstacles, up and down stairs, and ambulates with a variable cadence.   Imaging: No results found.   Labs: Lab Results  Component Value Date   HGBA1C 6.6 (H) 08/26/2019   HGBA1C 6.7 (H) 07/19/2019   HGBA1C 10.8 (H) 04/20/2019   REPTSTATUS 09/06/2019 FINAL 09/01/2019   GRAMSTAIN  09/01/2019    MODERATE WBC PRESENT, PREDOMINANTLY PMN NO  ORGANISMS SEEN    CULT  09/01/2019    No growth aerobically or anaerobically. Performed at [facility], [address], [place], [place] [address]    [facility] STREPTOCOCCUS ANGINOSIS 08/20/2019     Lab Results  Component Value Date   ALBUMIN 2.0 (L) 09/06/2019   ALBUMIN 2.0 (L) 08/26/2019   ALBUMIN 2.8 (L) 08/20/2019    Lab Results  Component Value Date   MG 2.0 09/03/2019   MG 1.8 08/26/2019   MG 2.0 07/19/2019   Lab Results  Component Value Date   VD25OH 109 (H) 07/19/2019   VD25OH 62 04/20/2019   VD25OH 51 01/20/2019    No results found for: PREALBUMIN CBC EXTENDED Latest Ref Rng & Units 09/06/2019 09/03/2019 09/01/2019  WBC 4.0 - 10.5 K/uL 10.3 9.4 6.7  RBC 4.22 - 5.81 MIL/uL 3.81(L) 3.69(L) 3.82(L)  HGB 13.0 - 17.0 g/dL 11.8(L) 11.6(L) 11.8(L)  HCT 39 - 52 % 36.5(L) 35.2(L) 36.2(L)  PLT 150 - 400 K/uL 317 356 292  NEUTROABS 1.7 - 7.7 K/uL 6.1 - -  LYMPHSABS 0.7 - 4.0 K/uL 3.1 - -     Body mass index is 31.87 kg/m.  Orders:  No orders of the defined types were placed in this encounter.  No orders of the defined types were placed in this encounter.    Procedures: No procedures performed  Clinical Data: No additional findings.  ROS:  All other systems negative, except as noted in the HPI. Review of Systems  Objective: Vital Signs: Ht 6\' 3"  (1.905 m)   Wt 255 lb (115.7 kg)   BMI 31.87 kg/m   Specialty Comments:  No specialty comments available.  PMFS History: Patient Active Problem List   Diagnosis Date Noted  . Right below-knee amputee ([name]) 09/03/2019  . 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[name])   . Presence of retained hardware   . Osteomyelitis of ankle or foot, acute ([name]) 08/26/2019  . Severe sepsis ([name]) 08/26/2019  . Hyponatremia 08/26/2019  . Acute osteomyelitis of right calcaneus ([name])   . Cutaneous abscess of right foot   . Diabetic ulcer of right heel associated with type 2 diabetes mellitus ([name]) 07/16/2019  . Poorly  controlled diabetes mellitus ([name]) 07/16/2019  . FHx: heart disease 10/08/2017  . Type 2 diabetes mellitus with stage 2 chronic kidney disease, without long-term current use of insulin ([name]) 10/08/2017  . Insomnia 07/08/2017  . Smoker 08/17/2015  . COPD (chronic obstructive pulmonary disease) ([name]) 08/17/2015  . 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 Medication management 05/19/2013  . Morbid obesity (BMI 35) 05/19/2013  . Essential hypertension   . Hyperlipidemia associated with type 2 diabetes mellitus ([name])   . Vitamin D deficiency   . History of kidney stones   . Testosterone deficiency   . History of hepatitis C    Past Medical History:  Diagnosis Date  . Diabetic neuropathy ([name])   . Diverticulitis   . History of hepatitis C    has been treated in the past  . History of kidney stones   . Hyperlipidemia   . Hypertension   . Other testicular hypofunction   . Type II or unspecified type diabetes mellitus without mention of complication, not stated as uncontrolled   . Vitamin D deficiency     Family History  Problem Relation Age of Onset  . Hypertension Mother   . Cancer Father        colon  . Alzheimer's disease Father   . Stroke Father     Past Surgical History:  Procedure Laterality Date  . AMPUTATION Right 09/01/2019   Procedure: RIGHT BELOW KNEE AMPUTATION;  Surgeon: 11/01/2019, MD;  Location[facility] OR;  Service: Orthopedics;  Laterality: Right;  . CHOLECYSTECTOMY  1987  . COLONOSCOPY    . I & D EXTREMITY Right 08/20/2019   Procedure: IRRIGATION & DEBRIDEMENT RIGHT FOOT PARTIAL CALCANEAL EXCISION;  Surgeon: 08/22/2019, MD;  Location: [facility] OR;  Service: Orthopedics;  Laterality: Right;  . I & D EXTREMITY Right 08/27/2019   Procedure: IRRIGATION AND DEBRIDEMENT  OF FOOT;  Surgeon: 10/27/2019, MD;  Location: [facility] OR;  Service: Orthopedics;  Laterality: Right;  . ORIF TIBIA FRACTURE     x 3  right leg   Social History   Occupational History  . Not on file  Tobacco Use  . Smoking status: Current Every Day Smoker    Packs/day: 1.00    Types: Cigarettes  . Smokeless tobacco: Never Used  Substance and Sexual Activity  . Alcohol use: No  . Drug use: Yes    Types: Marijuana  . Sexual activity: Not on file

## 2019-09-26 ENCOUNTER — Other Ambulatory Visit: Payer: Self-pay | Admitting: Internal Medicine

## 2019-09-28 ENCOUNTER — Other Ambulatory Visit: Payer: Self-pay

## 2019-09-28 ENCOUNTER — Ambulatory Visit: Payer: BC Managed Care – PPO | Admitting: Physician Assistant

## 2019-09-28 ENCOUNTER — Encounter: Payer: Self-pay | Admitting: Physician Assistant

## 2019-09-28 VITALS — BP 110/60 | HR 85 | Temp 96.5°F | Wt 213.0 lb

## 2019-09-28 DIAGNOSIS — E1122 Type 2 diabetes mellitus with diabetic chronic kidney disease: Secondary | ICD-10-CM | POA: Diagnosis not present

## 2019-09-28 DIAGNOSIS — K5900 Constipation, unspecified: Secondary | ICD-10-CM | POA: Diagnosis not present

## 2019-09-28 DIAGNOSIS — E1169 Type 2 diabetes mellitus with other specified complication: Secondary | ICD-10-CM

## 2019-09-28 DIAGNOSIS — Z79891 Long term (current) use of opiate analgesic: Secondary | ICD-10-CM | POA: Diagnosis not present

## 2019-09-28 DIAGNOSIS — I10 Essential (primary) hypertension: Secondary | ICD-10-CM | POA: Diagnosis not present

## 2019-09-28 DIAGNOSIS — Z87442 Personal history of urinary calculi: Secondary | ICD-10-CM | POA: Diagnosis not present

## 2019-09-28 DIAGNOSIS — Z89511 Acquired absence of right leg below knee: Secondary | ICD-10-CM

## 2019-09-28 DIAGNOSIS — E1142 Type 2 diabetes mellitus with diabetic polyneuropathy: Secondary | ICD-10-CM | POA: Diagnosis not present

## 2019-09-28 DIAGNOSIS — E785 Hyperlipidemia, unspecified: Secondary | ICD-10-CM

## 2019-09-28 DIAGNOSIS — E1165 Type 2 diabetes mellitus with hyperglycemia: Secondary | ICD-10-CM | POA: Diagnosis not present

## 2019-09-28 DIAGNOSIS — Z9181 History of falling: Secondary | ICD-10-CM | POA: Diagnosis not present

## 2019-09-28 DIAGNOSIS — F17210 Nicotine dependence, cigarettes, uncomplicated: Secondary | ICD-10-CM | POA: Diagnosis not present

## 2019-09-28 DIAGNOSIS — F17200 Nicotine dependence, unspecified, uncomplicated: Secondary | ICD-10-CM

## 2019-09-28 DIAGNOSIS — Z7982 Long term (current) use of aspirin: Secondary | ICD-10-CM | POA: Diagnosis not present

## 2019-09-28 DIAGNOSIS — M86271 Subacute osteomyelitis, right ankle and foot: Secondary | ICD-10-CM | POA: Diagnosis not present

## 2019-09-28 DIAGNOSIS — Z7984 Long term (current) use of oral hypoglycemic drugs: Secondary | ICD-10-CM | POA: Diagnosis not present

## 2019-09-28 DIAGNOSIS — Z4781 Encounter for orthopedic aftercare following surgical amputation: Secondary | ICD-10-CM | POA: Diagnosis not present

## 2019-09-28 DIAGNOSIS — N182 Chronic kidney disease, stage 2 (mild): Secondary | ICD-10-CM

## 2019-09-28 DIAGNOSIS — D62 Acute posthemorrhagic anemia: Secondary | ICD-10-CM | POA: Diagnosis not present

## 2019-09-28 DIAGNOSIS — R3915 Urgency of urination: Secondary | ICD-10-CM | POA: Diagnosis not present

## 2019-09-28 DIAGNOSIS — E559 Vitamin D deficiency, unspecified: Secondary | ICD-10-CM | POA: Diagnosis not present

## 2019-09-28 NOTE — Patient Instructions (Addendum)
Get back on the vitamin D Get on vitamin C 500mg  a day Zinc 50mg  a day And B12 or a B complex  Please think about getting a continuous blood sugar monitor Can look into this Can check out freestyle libre and Dexcom  SMOKING CESSATION WITH CHANTIX  [employer]  [phone] for more information or for a free program for smoking cessation help.   You can call QUIT SMART 1-800-QUIT-NOW for free nicotine patches or replacement therapy- if they are out- keep calling  [facility] Can call for smoking cessation classes, [phone]  If you have a smart phone, please look up Smoke Free app, this will help you stay on track and give you information about money you have saved, life that you have gained back and a ton of more information.   We are giving you chantix for smoking cessation. You can do it! And we are here to help! You may have heard some scary side effects about chantix, the three most common I hear about are nausea, crazy dreams and depression.  However, I like for my patients to try to stay on 1/2 a tablet twice a day rather than one tablet twice a day as normally prescribed. This helps decrease the chances of side effects and helps save money by making a one month prescription last two months  Please start the prescription this way:  Start 1/2 tablet by mouth once daily after food with a full glass of water for 3 days Then do 1/2 tablet by mouth twice daily for 4 days.  We do the slow and steady approach, continue to decrease your smoking while on the chantix At this point we have several options: 1) continue on 1/2 tablet twice a day- which I encourage you to do. You can stay on this dose the rest of the time on the medication or if you still feel the need to smoke you can do one of the two options below. 2) do one tablet in the morning and 1/2 in the evening which helps decrease dreams. 3) do one tablet twice a day.   What if I miss a dose? If you  miss a dose, take it as soon as you can. If it is almost time for your next dose, take only that dose. Do not take double or extra doses.  What should I watch for while using this medicine? Visit your doctor or health care professional for regular check ups. Ask for ongoing advice and encouragement from your doctor or healthcare professional, friends, and family to help you quit. If you smoke while on this medication, quit again  Your mouth may get dry. Chewing sugarless gum or hard candy, and drinking plenty of water may help. Contact your doctor if the problem does not go away or is severe.  You may get drowsy or dizzy. Do not drive, use machinery, or do anything that needs mental alertness until you know how this medicine affects you. Do not stand or sit up quickly, especially if you are an older patient.   The use of this medicine may increase the chance of suicidal thoughts or actions. Pay special attention to how you are responding while on this medicine. Any worsening of mood, or thoughts of suicide or dying should be reported to your health care professional right away.  ADVANTAGES OF QUITTING SMOKING  Within 20 minutes, blood pressure decreases. Your pulse is at normal level.  After 8 hours, carbon monoxide levels in  the blood return to normal. Your oxygen level increases.  After 24 hours, the chance of having a heart attack starts to decrease. Your breath, hair, and body stop smelling like smoke.  After 48 hours, damaged nerve endings begin to recover. Your sense of taste and smell improve.  After 72 hours, the body is virtually free of nicotine. Your bronchial tubes relax and breathing becomes easier.  After 2 to 12 weeks, lungs can hold more air. Exercise becomes easier and circulation improves.  After 1 year, the risk of coronary heart disease is cut in half.  After 5 years, the risk of stroke falls to the same as a nonsmoker.  After 10 years, the risk of lung cancer is cut  in half and the risk of other cancers decreases significantly.  After 15 years, the risk of coronary heart disease drops, usually to the level of a nonsmoker.  You will have extra money to spend on things other than cigarettes.      Bad carbs also include fruit juice, alcohol, and sweet tea. These are empty calories that do not signal to your brain that you are full.   Please remember the good carbs are still carbs which convert into sugar. So please measure them out no more than 1/2-1 cup of rice, oatmeal, pasta, and beans  Veggies are however free foods! Pile them on.   Not all fruit is created equal. Please see the list below, the fruit at the bottom is higher in sugars than the fruit at the top. Please avoid all dried fruits.

## 2019-09-28 NOTE — Progress Notes (Signed)
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  Patient denies any depression/anxiety- continue celexa 40mg  Will get on vitamin C, b12, zinc to aid in healing Not willing to quit smoking at this time, discussed cessation aids and given information Continue to monitor sugars at home  Poorly controlled diabetes mellitus ([name]) Discussed continuous glucose monitoring- will think about it Monitor sugars Continue diet  Type 2 diabetes mellitus with stage 2 chronic kidney disease, without long-term current use of insulin ([name]) Increase fluids, avoid NSAIDS, monitor sugars, will monitor  Smoker Not willing to quit smoking at this time, discussed cessation aids and given information  Morbid obesity (BMI 35) - follow up 3 months for progress monitoring - increase veggies, decrease carbs - long discussion about weight loss, diet, and exercise  Hyperlipidemia associated with type 2 diabetes mellitus ([name]) check lipids decrease fatty foods increase activity.    All medications were reviewed with patient and family and fully reconciled. All questions answered fully, and patient and family members were encouraged to call the office with any further questions or concerns. Discussed goal to avoid readmission related to this diagnosis.  Medications Discontinued During This Encounter  Medication Reason  . acetaminophen (TYLENOL) 325 MG tablet Patient Preference  . methocarbamol (ROBAXIN) 500 MG tablet Patient Preference  . cefdinir (OMNICEF) 300 MG capsule Patient Preference  . docusate sodium (COLACE) 100 MG capsule Patient Preference    Over 40 minutes of exam, counseling, chart review, and complex, high/moderate level critical decision making was performed this visit.   Future Appointments  Date Time Provider  Department Center  09/30/2019  9:20 AM Raulkar, 11/30/2019, MD [facility] CPR  10/14/2019  1:30 PM Persons, 10/16/2019, [place] [facility] None  10/21/2019  4:30 PM 10/23/2019, NP [facility] None  01/20/2020  4:00 PM 01/22/2020, MD [facility] None  07/19/2020  3:00 PM 07/21/2020, MD [facility] None     HPI 58 y.o.male with DM2, neuropathy, HTN, obesity, tobacco use presents for follow up for transition from recent hospitalization or SNIF stay. Admit date to the hospital was 09/03/19, patient was discharged from the hospital on 09/15/19 and our clinical staff contacted the office the day after discharge to set up a follow up appointment. The discharge summary, medications, and diagnostic test results were reviewed before meeting with the patient. The patient was admitted for:   [name] on 09/01/2019 due to osteomyelitis of his right foot. He has following up with ortho on 09/02 outpatient, had staples removed and has a prescription for a prosthetic.  He is eating and sleeping well. Denies depression/anxiety.  No fever, no chills.   He is on metformin 500mg  twice a day, and he is on glucotrol 5 mg twice a day with food. He has had lowest was 72 in the afternoon, he did have hypoglycemic awareness.  Lab Results  Component Value Date   HGBA1C 6.6 (H) 08/26/2019   Lab Results  Component Value Date   WBC 10.3 09/06/2019   HGB 11.8 (L) 09/06/2019   HCT 36.5 (L) 09/06/2019   MCV 95.8 09/06/2019   PLT 317 09/06/2019   Lab Results  Component Value Date   CREATININE 0.83 09/06/2019   BUN 10 09/06/2019   NA 136 09/06/2019   K 4.0 09/06/2019   CL 101 09/06/2019   CO2 25  09/06/2019   Lab Results  Component Value Date   IRON 72 07/19/2019   TIBC 291 07/19/2019   FERRITIN 1037 (H) 07/19/2010   He is not on a b12.  Lab Results  Component Value Date   VITAMINB12 343 07/19/2019    Home health is involved.   Images while in the hospital: No results found.   Current Outpatient  Medications (Endocrine & Metabolic):  .  glipiZIDE (GLUCOTROL) 5 MG tablet, Take 1 tablet (5 mg total) by mouth 2 (two) times daily before a meal. .  metFORMIN (GLUCOPHAGE) 500 MG tablet, Take 1 tablet (500 mg total) by mouth 2 (two) times daily with a meal.  Current Outpatient Medications (Cardiovascular):  .  hydrochlorothiazide (HYDRODIURIL) 25 MG tablet, Take 1 tablet by mouth once daily .  lisinopril (ZESTRIL) 20 MG tablet, Take 1 tablet    Daily   for BP & Diabetic Kidney Protection .  verapamil (CALAN-SR) 240 MG CR tablet, Take 1 tablet Daily with Food for BP & Heart  Rhythm   Current Outpatient Medications (Analgesics):  .  aspirin 81 MG tablet, Take 81 mg by mouth daily. Marland Kitchen  oxyCODONE (OXY IR/ROXICODONE) 5 MG immediate release tablet, Take 1-2 tablets (5-10 mg total) by mouth every 4 (four) hours as needed for moderate pain (pain score 4-6). Marland Kitchen  traMADol (ULTRAM) 50 MG tablet, Take 1 tablet (50 mg total) by mouth every 6 (six) hours. (Patient taking differently: Take 50 mg by mouth every 6 (six) hours as needed. )   Current Outpatient Medications (Other):  .  citalopram (CELEXA) 40 MG tablet, Take 1 tablet Daily for Mood & Anxiety  Past Medical History:  Diagnosis Date  . Diabetic neuropathy ([name])   . Diverticulitis   . History of hepatitis C    has been treated in the past  . History of kidney stones   . Hyperlipidemia   . Hypertension   . Other testicular hypofunction   . Type II or unspecified type diabetes mellitus without mention of complication, not stated as uncontrolled   . Vitamin D deficiency      Allergies  Allergen Reactions  . Invokamet [Canagliflozin-Metformin Hcl] Other (See Comments)    Extremity edema/caused pain  . Invokana [Canagliflozin] Other (See Comments)    Extremity edema/caused pain  . Codeine Camsylate [Codeine] Rash  . Morphine And Related Rash    ROS: all negative except above.   Physical Exam: Filed Weights   09/28/19 0909  Weight:  213 lb (96.6 kg)   BP 110/60   Pulse 85   Temp (!) 96.5 F (35.8 C)   Wt 213 lb (96.6 kg)   SpO2 98%   BMI 26.62 kg/m  General Appearance: Well nourished, in no apparent distress. Eyes: conjunctiva no swelling or erythema ENT/Mouth: Mask in place; Hearing normal.  Neck: Supple Respiratory: Respiratory effort normal, BS equal bilaterally without rales, rhonchi, wheezing or stridor.  Cardio: RRR with no MRGs.  Abdomen: Soft, + BS.  Non tender. Lymphatics: Non tender without lymphadenopathy.  Musculoskeletal: Symmetrical strength, patient in wheel chair, [name]. Skin: Warm, dry without rashes, [name] with well healing wound, no discharge, no warmth, no erythema. Neuro: diminished left foot  Psych: Awake and oriented X 3, normal affect, Insight and Judgment appropriate.    Quentin Mulling, PA-C 9:45 AM [facility]

## 2019-09-29 DIAGNOSIS — K5900 Constipation, unspecified: Secondary | ICD-10-CM

## 2019-09-29 DIAGNOSIS — E785 Hyperlipidemia, unspecified: Secondary | ICD-10-CM

## 2019-09-29 DIAGNOSIS — Z87442 Personal history of urinary calculi: Secondary | ICD-10-CM

## 2019-09-29 DIAGNOSIS — E1142 Type 2 diabetes mellitus with diabetic polyneuropathy: Secondary | ICD-10-CM

## 2019-09-29 DIAGNOSIS — R3915 Urgency of urination: Secondary | ICD-10-CM

## 2019-09-29 DIAGNOSIS — D62 Acute posthemorrhagic anemia: Secondary | ICD-10-CM

## 2019-09-29 DIAGNOSIS — Z4781 Encounter for orthopedic aftercare following surgical amputation: Secondary | ICD-10-CM

## 2019-09-29 DIAGNOSIS — Z9181 History of falling: Secondary | ICD-10-CM

## 2019-09-29 DIAGNOSIS — F17210 Nicotine dependence, cigarettes, uncomplicated: Secondary | ICD-10-CM

## 2019-09-29 DIAGNOSIS — Z89511 Acquired absence of right leg below knee: Secondary | ICD-10-CM

## 2019-09-29 DIAGNOSIS — I10 Essential (primary) hypertension: Secondary | ICD-10-CM

## 2019-09-29 DIAGNOSIS — Z79891 Long term (current) use of opiate analgesic: Secondary | ICD-10-CM

## 2019-09-29 DIAGNOSIS — Z7984 Long term (current) use of oral hypoglycemic drugs: Secondary | ICD-10-CM

## 2019-09-29 DIAGNOSIS — Z7982 Long term (current) use of aspirin: Secondary | ICD-10-CM

## 2019-09-29 DIAGNOSIS — E559 Vitamin D deficiency, unspecified: Secondary | ICD-10-CM

## 2019-09-30 ENCOUNTER — Encounter
Payer: BC Managed Care – PPO | Attending: Physical Medicine and Rehabilitation | Admitting: Physical Medicine and Rehabilitation

## 2019-09-30 ENCOUNTER — Encounter: Payer: Self-pay | Admitting: Physical Medicine and Rehabilitation

## 2019-09-30 ENCOUNTER — Other Ambulatory Visit: Payer: Self-pay

## 2019-09-30 VITALS — BP 101/65 | HR 76 | Temp 98.0°F | Ht 75.0 in | Wt 213.0 lb

## 2019-09-30 DIAGNOSIS — E1142 Type 2 diabetes mellitus with diabetic polyneuropathy: Secondary | ICD-10-CM | POA: Diagnosis not present

## 2019-09-30 DIAGNOSIS — M86271 Subacute osteomyelitis, right ankle and foot: Secondary | ICD-10-CM | POA: Diagnosis not present

## 2019-09-30 DIAGNOSIS — E1165 Type 2 diabetes mellitus with hyperglycemia: Secondary | ICD-10-CM | POA: Diagnosis not present

## 2019-09-30 DIAGNOSIS — E1140 Type 2 diabetes mellitus with diabetic neuropathy, unspecified: Secondary | ICD-10-CM

## 2019-09-30 DIAGNOSIS — Z9181 History of falling: Secondary | ICD-10-CM | POA: Diagnosis not present

## 2019-09-30 DIAGNOSIS — I10 Essential (primary) hypertension: Secondary | ICD-10-CM | POA: Diagnosis not present

## 2019-09-30 DIAGNOSIS — E785 Hyperlipidemia, unspecified: Secondary | ICD-10-CM | POA: Diagnosis not present

## 2019-09-30 DIAGNOSIS — K5900 Constipation, unspecified: Secondary | ICD-10-CM | POA: Diagnosis not present

## 2019-09-30 DIAGNOSIS — Z7982 Long term (current) use of aspirin: Secondary | ICD-10-CM | POA: Diagnosis not present

## 2019-09-30 DIAGNOSIS — N182 Chronic kidney disease, stage 2 (mild): Secondary | ICD-10-CM | POA: Insufficient documentation

## 2019-09-30 DIAGNOSIS — Z89511 Acquired absence of right leg below knee: Secondary | ICD-10-CM

## 2019-09-30 DIAGNOSIS — E559 Vitamin D deficiency, unspecified: Secondary | ICD-10-CM | POA: Diagnosis not present

## 2019-09-30 DIAGNOSIS — Z7984 Long term (current) use of oral hypoglycemic drugs: Secondary | ICD-10-CM | POA: Diagnosis not present

## 2019-09-30 DIAGNOSIS — Z87442 Personal history of urinary calculi: Secondary | ICD-10-CM | POA: Diagnosis not present

## 2019-09-30 DIAGNOSIS — E1149 Type 2 diabetes mellitus with other diabetic neurological complication: Secondary | ICD-10-CM | POA: Insufficient documentation

## 2019-09-30 DIAGNOSIS — E1122 Type 2 diabetes mellitus with diabetic chronic kidney disease: Secondary | ICD-10-CM | POA: Diagnosis not present

## 2019-09-30 DIAGNOSIS — Z79891 Long term (current) use of opiate analgesic: Secondary | ICD-10-CM | POA: Diagnosis not present

## 2019-09-30 DIAGNOSIS — D62 Acute posthemorrhagic anemia: Secondary | ICD-10-CM | POA: Diagnosis not present

## 2019-09-30 DIAGNOSIS — Z4781 Encounter for orthopedic aftercare following surgical amputation: Secondary | ICD-10-CM | POA: Diagnosis not present

## 2019-09-30 DIAGNOSIS — F17210 Nicotine dependence, cigarettes, uncomplicated: Secondary | ICD-10-CM | POA: Diagnosis not present

## 2019-09-30 DIAGNOSIS — R3915 Urgency of urination: Secondary | ICD-10-CM | POA: Diagnosis not present

## 2019-09-30 MED ORDER — FREESTYLE LIBRE SENSOR SYSTEM MISC: 1.0000 | Freq: Every day | 0 refills | Status: DC

## 2019-09-30 NOTE — Progress Notes (Signed)
Subjective:    Patient ID: [name], male    DOB: Aug 02, 1960, 59 y.o.   MRN: [id number]  HPI  Mr [name] is a 59 year old man who presents for follow-up after BKA.   Has been working on leg strength, shoulder strength, walking. Has walked 15 feet down the hall. Doe not feel very comfortable with the walker. He does standing exercises- knee bending. Has not walked outside of home at home.   Little to no pain. Had a little bit when he moved to a smaller shrinker.   Denies constipation.   He has had follow-up with Dr. Lajoyce Corners for staple removal.   Sores on left foot are almost gone.   Sugars have been 135 at home.    Pain Inventory Average Pain 2 Pain Right Now 0 My pain is aching  In the last 24 hours, has pain interfered with the following? General activity 0 Relation with others 10 Enjoyment of life 10 What TIME of day is your pain at its worst? evening, night  Sleep (in general) Fair  Pain is worse with: unsure Pain improves with: medication Relief from Meds: 7  ability to climb steps?  no do you drive?  no use a wheelchair transfers alone Do you have any goals in this area?  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  yes  trouble walking  tc  tc    Family History  Problem Relation Age of Onset  . Hypertension Mother   . Cancer Father        colon  . Alzheimer's disease Father   . Stroke Father    Social History   Socioeconomic History  . Marital status: Married    Spouse name: Not on file  . Number of children: Not on file  . Years of education: Not on file  . Highest education level: Not on file  Occupational History  . Not on file  Tobacco Use  . Smoking status: Current Every Day Smoker    Packs/day: 1.00    Types: Cigarettes  . Smokeless tobacco: Never Used  Substance and  Sexual Activity  . Alcohol use: No  . Drug use: Yes    Types: Marijuana  . Sexual activity: Not on file  Other Topics Concern  . Not on file  Social History Narrative  . Not on file   Social Determinants of Health   Financial Resource Strain:   . Difficulty of Paying Living Expenses: Not on file  Food Insecurity:   . Worried About Programme researcher, broadcasting/film/video in the Last Year: Not on file  . Ran Out of Food in the Last Year: Not on file  Transportation Needs:   . Lack of Transportation (Medical): Not on file  . Lack of Transportation (Non-Medical): Not on file  Physical Activity:   . Days of Exercise per Week: Not on file  . Minutes of Exercise per Session: Not on file  Stress:   . Feeling of Stress : Not on file  Social Connections:   . Frequency of Communication with Friends and Family: Not on file  . Frequency of Social Gatherings with Friends and Family: Not on file  . Attends Religious Services: Not on file  . Active Member of Clubs or Organizations: Not on file  . Attends Banker Meetings:  Not on file  . Marital Status: Not on file   Past Surgical History:  Procedure Laterality Date  . AMPUTATION Right 09/01/2019   Procedure: RIGHT BELOW KNEE AMPUTATION;  Surgeon: Nadara Mustard, MD;  Location: [facility] OR;  Service: Orthopedics;  Laterality: Right;  . CHOLECYSTECTOMY  1987  . COLONOSCOPY    . I & D EXTREMITY Right 08/20/2019   Procedure: IRRIGATION & DEBRIDEMENT RIGHT FOOT PARTIAL CALCANEAL EXCISION;  Surgeon: Nadara Mustard, MD;  Location: [facility] OR;  Service: Orthopedics;  Laterality: Right;  . I & D EXTREMITY Right 08/27/2019   Procedure: IRRIGATION AND DEBRIDEMENT  OF FOOT;  Surgeon: Tarry Kos, MD;  Location: [facility] OR;  Service: Orthopedics;  Laterality: Right;  . ORIF TIBIA FRACTURE     x 3 right leg   Past Medical History:  Diagnosis Date  . Diabetic neuropathy ([name])   . Diverticulitis   . History of hepatitis C    has been treated in the past  . History of  kidney stones   . Hyperlipidemia   . Hypertension   . Other testicular hypofunction   . Type II or unspecified type diabetes mellitus without mention of complication, not stated as uncontrolled   . Vitamin D deficiency    BP 101/65   Pulse 76   Temp 98 F (36.7 C)   Ht 6\' 3"  (1.905 m)   Wt 213 lb (96.6 kg)   SpO2 96%   BMI 26.62 kg/m   Opioid Risk Score:   Fall Risk Score:  `1  Depression screen PHQ 2/9  Depression screen [facility] 2/9 08/04/2019 07/18/2019 01/20/2019 11/02/2017 10/08/2017 04/05/2017 12/25/2016  Decreased Interest 0 0 0 0 0 0 0  Down, Depressed, Hopeless 0 0 0 0 0 0 0  PHQ - 2 Score 0 0 0 0 0 0 0   Review of Systems     Objective:   Physical Exam Gen: no distress, normal appearing HEENT: oral mucosa pink and moist, NCAT Cardio: Reg rate Chest: normal effort, normal rate of breathing Abd: soft, non-distended Ext: no edema Skin: intact Neuro: Alert and oriented x3.  Musculoskeletal: 5/5 strength throughout. R BKA in shrinker.  Psych: pleasant, normal affect    Assessment & Plan:  1) Impaired mobility and ADLs: -continue home therapy -advised increasing walking to 20 feet -will provide script for a second shrinker and fax along with note to [employer].  -I will fill out his disability paperwork for him. Jan 1st return to work diet. 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   2) Diabetes -prescribed freestyle libre device. Discussed how this can be advantageous to check sugars multiples times per day after eating, to determine which foods most spike his sugars.  -avoid muffins and vegetable oil.  -eggs are great, which he eats daily fore breakfast  3) R BKA -prosthetic fitting next week -Has such a great attitude! -Had follow-up with Dr. Jan 3 and staples were removed.   4) Obesity: -Educated that current weight is 213 lbs and current BMI is 26.62 lbs -Educated regarding health benefits of weight loss- for pain, general health, immune health, mental  health.  -Will monitor weight every visit.  -Made goal to increase daily walking to 20 feet, eliminate vegetable oils and muffins.   5) Fatigue: Lifestyle modifications as above will help to improve energy as well.   Transitional care appointment necessary given patient's life-changing amputation- he needed scripts for shrinker, help for his long-term disability  paperwork, and counseling regarding his poorly controlled diabetes to prevent future amputations. RTC in 1 month to assess progress with above interventions.

## 2019-10-05 DIAGNOSIS — I10 Essential (primary) hypertension: Secondary | ICD-10-CM | POA: Diagnosis not present

## 2019-10-05 DIAGNOSIS — D62 Acute posthemorrhagic anemia: Secondary | ICD-10-CM | POA: Diagnosis not present

## 2019-10-05 DIAGNOSIS — Z87442 Personal history of urinary calculi: Secondary | ICD-10-CM | POA: Diagnosis not present

## 2019-10-05 DIAGNOSIS — Z79891 Long term (current) use of opiate analgesic: Secondary | ICD-10-CM | POA: Diagnosis not present

## 2019-10-05 DIAGNOSIS — E559 Vitamin D deficiency, unspecified: Secondary | ICD-10-CM | POA: Diagnosis not present

## 2019-10-05 DIAGNOSIS — Z89511 Acquired absence of right leg below knee: Secondary | ICD-10-CM | POA: Diagnosis not present

## 2019-10-05 DIAGNOSIS — Z4781 Encounter for orthopedic aftercare following surgical amputation: Secondary | ICD-10-CM | POA: Diagnosis not present

## 2019-10-05 DIAGNOSIS — Z7984 Long term (current) use of oral hypoglycemic drugs: Secondary | ICD-10-CM | POA: Diagnosis not present

## 2019-10-05 DIAGNOSIS — F17210 Nicotine dependence, cigarettes, uncomplicated: Secondary | ICD-10-CM | POA: Diagnosis not present

## 2019-10-05 DIAGNOSIS — Z7982 Long term (current) use of aspirin: Secondary | ICD-10-CM | POA: Diagnosis not present

## 2019-10-05 DIAGNOSIS — K5900 Constipation, unspecified: Secondary | ICD-10-CM | POA: Diagnosis not present

## 2019-10-05 DIAGNOSIS — R3915 Urgency of urination: Secondary | ICD-10-CM | POA: Diagnosis not present

## 2019-10-05 DIAGNOSIS — E1142 Type 2 diabetes mellitus with diabetic polyneuropathy: Secondary | ICD-10-CM | POA: Diagnosis not present

## 2019-10-05 DIAGNOSIS — E785 Hyperlipidemia, unspecified: Secondary | ICD-10-CM | POA: Diagnosis not present

## 2019-10-05 DIAGNOSIS — Z9181 History of falling: Secondary | ICD-10-CM | POA: Diagnosis not present

## 2019-10-07 DIAGNOSIS — I10 Essential (primary) hypertension: Secondary | ICD-10-CM | POA: Diagnosis not present

## 2019-10-07 DIAGNOSIS — Z4781 Encounter for orthopedic aftercare following surgical amputation: Secondary | ICD-10-CM | POA: Diagnosis not present

## 2019-10-07 DIAGNOSIS — Z7982 Long term (current) use of aspirin: Secondary | ICD-10-CM | POA: Diagnosis not present

## 2019-10-07 DIAGNOSIS — E785 Hyperlipidemia, unspecified: Secondary | ICD-10-CM | POA: Diagnosis not present

## 2019-10-07 DIAGNOSIS — R3915 Urgency of urination: Secondary | ICD-10-CM | POA: Diagnosis not present

## 2019-10-07 DIAGNOSIS — D62 Acute posthemorrhagic anemia: Secondary | ICD-10-CM | POA: Diagnosis not present

## 2019-10-07 DIAGNOSIS — E559 Vitamin D deficiency, unspecified: Secondary | ICD-10-CM | POA: Diagnosis not present

## 2019-10-07 DIAGNOSIS — Z89511 Acquired absence of right leg below knee: Secondary | ICD-10-CM | POA: Diagnosis not present

## 2019-10-07 DIAGNOSIS — F17210 Nicotine dependence, cigarettes, uncomplicated: Secondary | ICD-10-CM | POA: Diagnosis not present

## 2019-10-07 DIAGNOSIS — Z87442 Personal history of urinary calculi: Secondary | ICD-10-CM | POA: Diagnosis not present

## 2019-10-07 DIAGNOSIS — Z7984 Long term (current) use of oral hypoglycemic drugs: Secondary | ICD-10-CM | POA: Diagnosis not present

## 2019-10-07 DIAGNOSIS — E1142 Type 2 diabetes mellitus with diabetic polyneuropathy: Secondary | ICD-10-CM | POA: Diagnosis not present

## 2019-10-07 DIAGNOSIS — K5900 Constipation, unspecified: Secondary | ICD-10-CM | POA: Diagnosis not present

## 2019-10-07 DIAGNOSIS — Z79891 Long term (current) use of opiate analgesic: Secondary | ICD-10-CM | POA: Diagnosis not present

## 2019-10-07 DIAGNOSIS — Z9181 History of falling: Secondary | ICD-10-CM | POA: Diagnosis not present

## 2019-10-12 DIAGNOSIS — Z87442 Personal history of urinary calculi: Secondary | ICD-10-CM | POA: Diagnosis not present

## 2019-10-12 DIAGNOSIS — Z4781 Encounter for orthopedic aftercare following surgical amputation: Secondary | ICD-10-CM | POA: Diagnosis not present

## 2019-10-12 DIAGNOSIS — E785 Hyperlipidemia, unspecified: Secondary | ICD-10-CM | POA: Diagnosis not present

## 2019-10-12 DIAGNOSIS — Z79891 Long term (current) use of opiate analgesic: Secondary | ICD-10-CM | POA: Diagnosis not present

## 2019-10-12 DIAGNOSIS — E559 Vitamin D deficiency, unspecified: Secondary | ICD-10-CM | POA: Diagnosis not present

## 2019-10-12 DIAGNOSIS — D62 Acute posthemorrhagic anemia: Secondary | ICD-10-CM | POA: Diagnosis not present

## 2019-10-12 DIAGNOSIS — F17210 Nicotine dependence, cigarettes, uncomplicated: Secondary | ICD-10-CM | POA: Diagnosis not present

## 2019-10-12 DIAGNOSIS — Z89511 Acquired absence of right leg below knee: Secondary | ICD-10-CM | POA: Diagnosis not present

## 2019-10-12 DIAGNOSIS — E1142 Type 2 diabetes mellitus with diabetic polyneuropathy: Secondary | ICD-10-CM | POA: Diagnosis not present

## 2019-10-12 DIAGNOSIS — Z9181 History of falling: Secondary | ICD-10-CM | POA: Diagnosis not present

## 2019-10-12 DIAGNOSIS — K5900 Constipation, unspecified: Secondary | ICD-10-CM | POA: Diagnosis not present

## 2019-10-12 DIAGNOSIS — I10 Essential (primary) hypertension: Secondary | ICD-10-CM | POA: Diagnosis not present

## 2019-10-12 DIAGNOSIS — Z7984 Long term (current) use of oral hypoglycemic drugs: Secondary | ICD-10-CM | POA: Diagnosis not present

## 2019-10-12 DIAGNOSIS — R3915 Urgency of urination: Secondary | ICD-10-CM | POA: Diagnosis not present

## 2019-10-12 DIAGNOSIS — Z7982 Long term (current) use of aspirin: Secondary | ICD-10-CM | POA: Diagnosis not present

## 2019-10-14 ENCOUNTER — Encounter: Payer: Self-pay | Admitting: Physician Assistant

## 2019-10-14 ENCOUNTER — Ambulatory Visit (INDEPENDENT_AMBULATORY_CARE_PROVIDER_SITE_OTHER): Payer: BC Managed Care – PPO | Admitting: Physician Assistant

## 2019-10-14 VITALS — Ht 75.0 in | Wt 213.0 lb

## 2019-10-14 DIAGNOSIS — S88111A Complete traumatic amputation at level between knee and ankle, right lower leg, initial encounter: Secondary | ICD-10-CM

## 2019-10-14 NOTE — Progress Notes (Signed)
Office Visit Note   Patient: [name]           Date of Birth: 1960/09/03           MRN: [id number] Visit Date: 10/14/2019              Requested by: Lucky Cowboy, MD [address] [place],  [place] [address] PCP: Lucky Cowboy, MD  Chief Complaint  Patient presents with  . Right Leg - Routine Post Op    09/01/19 [name]       HPI: The patient is a pleasant 58 year old gentleman who is 6 weeks status post right below-knee amputation.  He is feeling well.  He is beginning his fittings with [facility].  He is hopefully anticipating having a prosthetic set up within the next 3 or 4 weeks  Assessment & Plan: Visit Diagnoses: No diagnosis found.  Plan: Patient will follow up in 4 weeks I will place an order for physical therapy.  Follow-Up Instructions: No follow-ups on file.   Ortho Exam  Patient is alert, oriented, no adenopathy, well-dressed, normal affect, normal respiratory effort. Stump is completely healed swelling has significantly decreased.  No cellulitis.  Imaging: No results found.   Labs: Lab Results  Component Value Date   HGBA1C 6.6 (H) 08/26/2019   HGBA1C 6.7 (H) 07/19/2019   HGBA1C 10.8 (H) 04/20/2019   REPTSTATUS 09/06/2019 FINAL 09/01/2019   GRAMSTAIN  09/01/2019    MODERATE WBC PRESENT, PREDOMINANTLY PMN NO ORGANISMS SEEN    CULT  09/01/2019    No growth aerobically or anaerobically. Performed at [facility], [address], [place], [place] [address]    [facility] STREPTOCOCCUS ANGINOSIS 08/20/2019     Lab Results  Component Value Date   ALBUMIN 2.0 (L) 09/06/2019   ALBUMIN 2.0 (L) 08/26/2019   ALBUMIN 2.8 (L) 08/20/2019    Lab Results  Component Value Date   MG 2.0 09/03/2019   MG 1.8 08/26/2019   MG 2.0 07/19/2019   Lab Results  Component Value Date   VD25OH 109 (H) 07/19/2019   VD25OH 62 04/20/2019   VD25OH 51 01/20/2019    No results found for: PREALBUMIN CBC EXTENDED Latest Ref Rng &  Units 09/06/2019 09/03/2019 09/01/2019  WBC 4.0 - 10.5 K/uL 10.3 9.4 6.7  RBC 4.22 - 5.81 MIL/uL 3.81(L) 3.69(L) 3.82(L)  HGB 13.0 - 17.0 g/dL 11.8(L) 11.6(L) 11.8(L)  HCT 39 - 52 % 36.5(L) 35.2(L) 36.2(L)  PLT 150 - 400 K/uL 317 356 292  NEUTROABS 1.7 - 7.7 K/uL 6.1 - -  LYMPHSABS 0.7 - 4.0 K/uL 3.1 - -     Body mass index is 26.62 kg/m.  Orders:  No orders of the defined types were placed in this encounter.  No orders of the defined types were placed in this encounter.    Procedures: No procedures performed  Clinical Data: No additional findings.  ROS:  All other systems negative, except as noted in the HPI. Review of Systems  Objective: Vital Signs: Ht 6\' 3"  (1.905 m)   Wt 213 lb (96.6 kg)   BMI 26.62 kg/m   Specialty Comments:  No specialty comments available.  PMFS History: Patient Active Problem List   Diagnosis Date Noted  . Right below-knee amputee ([name]) 09/03/2019  . 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[name])   . Presence of retained hardware   . Osteomyelitis of ankle or foot, acute ([name]) 08/26/2019  . Severe sepsis ([name]) 08/26/2019  . Hyponatremia 08/26/2019  .  Acute osteomyelitis of right calcaneus ([name])   . Cutaneous abscess of right foot   . Diabetic ulcer of right heel associated with type 2 diabetes mellitus ([name]) 07/16/2019  . Poorly controlled diabetes mellitus ([name]) 07/16/2019  . FHx: heart disease 10/08/2017  . Type 2 diabetes mellitus with stage 2 chronic kidney disease, without long-term current use of insulin ([name]) 10/08/2017  . Insomnia 07/08/2017  . Smoker 08/17/2015  . COPD (chronic obstructive pulmonary disease) ([name]) 08/17/2015  . 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 Medication management 05/19/2013  . Morbid obesity (BMI 35) 05/19/2013  . Essential hypertension   . Hyperlipidemia associated with type 2 diabetes mellitus ([name])   . Vitamin D deficiency   . History  of kidney stones   . Testosterone deficiency   . History of hepatitis C    Past Medical History:  Diagnosis Date  . Diabetic neuropathy ([name])   . Diverticulitis   . History of hepatitis C    has been treated in the past  . History of kidney stones   . Hyperlipidemia   . Hypertension   . Other testicular hypofunction   . Type II or unspecified type diabetes mellitus without mention of complication, not stated as uncontrolled   . Vitamin D deficiency     Family History  Problem Relation Age of Onset  . Hypertension Mother   . Cancer Father        colon  . Alzheimer's disease Father   . Stroke Father     Past Surgical History:  Procedure Laterality Date  . AMPUTATION Right 09/01/2019   Procedure: RIGHT BELOW KNEE AMPUTATION;  Surgeon: Nadara Mustard, MD;  Location: [facility] OR;  Service: Orthopedics;  Laterality: Right;  . CHOLECYSTECTOMY  1987  . COLONOSCOPY    . I & D EXTREMITY Right 08/20/2019   Procedure: IRRIGATION & DEBRIDEMENT RIGHT FOOT PARTIAL CALCANEAL EXCISION;  Surgeon: Nadara Mustard, MD;  Location: [facility] OR;  Service: Orthopedics;  Laterality: Right;  . I & D EXTREMITY Right 08/27/2019   Procedure: IRRIGATION AND DEBRIDEMENT  OF FOOT;  Surgeon: Tarry Kos, MD;  Location: [facility] OR;  Service: Orthopedics;  Laterality: Right;  . ORIF TIBIA FRACTURE     x 3 right leg   Social History   Occupational History  . Not on file  Tobacco Use  . Smoking status: Current Every Day Smoker    Packs/day: 1.00    Types: Cigarettes  . Smokeless tobacco: Never Used  Substance and Sexual Activity  . Alcohol use: No  . Drug use: Yes    Types: Marijuana  . Sexual activity: Not on file

## 2019-10-15 DIAGNOSIS — Z89511 Acquired absence of right leg below knee: Secondary | ICD-10-CM | POA: Diagnosis not present

## 2019-10-20 ENCOUNTER — Telehealth: Payer: Self-pay | Admitting: *Deleted

## 2019-10-20 NOTE — Telephone Encounter (Signed)
[facility] called to request a PT extension beginning next week 1 wk 2.  Approval given.

## 2019-10-21 ENCOUNTER — Encounter: Payer: Self-pay | Admitting: Adult Health

## 2019-10-21 ENCOUNTER — Ambulatory Visit: Payer: BC Managed Care – PPO | Admitting: Adult Health

## 2019-10-21 ENCOUNTER — Other Ambulatory Visit: Payer: Self-pay

## 2019-10-21 VITALS — BP 102/60 | HR 64 | Temp 96.2°F

## 2019-10-21 DIAGNOSIS — J449 Chronic obstructive pulmonary disease, unspecified: Secondary | ICD-10-CM | POA: Diagnosis not present

## 2019-10-21 DIAGNOSIS — E1169 Type 2 diabetes mellitus with other specified complication: Secondary | ICD-10-CM | POA: Diagnosis not present

## 2019-10-21 DIAGNOSIS — E1149 Type 2 diabetes mellitus with other diabetic neurological complication: Secondary | ICD-10-CM

## 2019-10-21 DIAGNOSIS — E785 Hyperlipidemia, unspecified: Secondary | ICD-10-CM | POA: Diagnosis not present

## 2019-10-21 DIAGNOSIS — E1122 Type 2 diabetes mellitus with diabetic chronic kidney disease: Secondary | ICD-10-CM

## 2019-10-21 DIAGNOSIS — E559 Vitamin D deficiency, unspecified: Secondary | ICD-10-CM

## 2019-10-21 DIAGNOSIS — Z79899 Other long term (current) drug therapy: Secondary | ICD-10-CM | POA: Diagnosis not present

## 2019-10-21 DIAGNOSIS — I10 Essential (primary) hypertension: Secondary | ICD-10-CM

## 2019-10-21 DIAGNOSIS — E1140 Type 2 diabetes mellitus with diabetic neuropathy, unspecified: Secondary | ICD-10-CM

## 2019-10-21 DIAGNOSIS — Z89511 Acquired absence of right leg below knee: Secondary | ICD-10-CM

## 2019-10-21 DIAGNOSIS — F17200 Nicotine dependence, unspecified, uncomplicated: Secondary | ICD-10-CM

## 2019-10-21 DIAGNOSIS — N182 Chronic kidney disease, stage 2 (mild): Secondary | ICD-10-CM

## 2019-10-21 MED ORDER — GLIPIZIDE 5 MG PO TABS: 5.0000 mg | ORAL_TABLET | Freq: Every day | ORAL | 0 refills | Status: DC

## 2019-10-21 MED ORDER — ROSUVASTATIN CALCIUM 40 MG PO TABS: ORAL_TABLET | ORAL | 2 refills | Status: DC

## 2019-10-21 MED ORDER — HYDROCHLOROTHIAZIDE 25 MG PO TABS: 12.5000 mg | ORAL_TABLET | Freq: Every day | ORAL | 0 refills | Status: DC

## 2019-10-21 NOTE — Patient Instructions (Addendum)
Goals    . Blood Pressure < 130/80    . HEMOGLOBIN A1C < 7.0    . LDL CALC < 70    . Weight (lb) < 260 lb (117.9 kg)        Reduce hydrochlorothiazide to 1/2 tab per day Goal blood pressure 100/60+ but <130/80   If this works well can switch to combo pill with lisinopril  Continue verapamil   Start rosuvastatin 1/2 tab twice a week   WATCH FOOT CLOSELY - ANY WORSENING CALL RIGHT AWAY   Wound care to do every day . Take off old bandage . Rinse off leg thoroughly in shower, wipe off any excess thick yellow discharge until you see a clean wound with some gauze or soft washcloth . Mix up betadine slurry: Combine sugar and betadine in a container that you can seal and keep; mix ingredients to create a paste the consistency of peanut butter . Apply betadine and sugar mixture to wound to cover all edges, apply non-adherent dressing pads, then an absorbent dressing (gauze or ABD) to catch any excess leaking, then wrap with ace, gauze wrapping and or tape to hold dressing in place.  . Remember betadine will stain clothing and sheets - make sure to cover/protect any furniture or linens - you can put down absorbent chucks pad or trash bags     SMOKING CESSATION  [employer]  24268341962 for more information or for a free program for smoking cessation help.   You can call QUIT SMART 1-800-QUIT-NOW for free nicotine patches or replacement therapy- if they are out- keep calling  [facility] Can call for smoking cessation classes, [phone]  If you have a smart phone, please look up Smoke Free app, this will help you stay on track and give you information about money you have saved, life that you have gained back and a ton of more information.     ADVANTAGES OF QUITTING SMOKING  Within 20 minutes, blood pressure decreases. Your pulse is at normal level.  After 8 hours, carbon monoxide levels in the blood return to normal. Your oxygen level  increases.  After 24 hours, the chance of having a heart attack starts to decrease. Your breath, hair, and body stop smelling like smoke.  After 48 hours, damaged nerve endings begin to recover. Your sense of taste and smell improve.  After 72 hours, the body is virtually free of nicotine. Your bronchial tubes relax and breathing becomes easier.  After 2 to 12 weeks, lungs can hold more air. Exercise becomes easier and circulation improves.  After 1 year, the risk of coronary heart disease is cut in half.  After 5 years, the risk of stroke falls to the same as a nonsmoker.  After 10 years, the risk of lung cancer is cut in half and the risk of other cancers decreases significantly.  After 15 years, the risk of coronary heart disease drops, usually to the level of a nonsmoker.  You will have extra money to spend on things other than cigarettes.

## 2019-10-21 NOTE — Progress Notes (Signed)
FOLLOW UP  Assessment and Plan:   Hypertension Some concern with hypotension; reduce HCTZ 1/2 tab Continue lisinopril and verapamil;  Monitor blood pressure at home; patient to call if consistently greater than 130/80 Continue DASH diet.   Reminder to go to the ER if any CP, SOB, nausea, dizziness, severe HA, changes vision/speech, left arm numbness and tingling and jaw pain.  Cholesterol Currently above goal of LDL <70;  Willing to restart rosuvastatin; recommended 20 mg twice weekly to start Continue low cholesterol diet and exercise.  Check lipid panel.   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  Perform daily foot/skin check, notify office of any concerning changes.  Check A1C  Obesity with co morbidities Long discussion about weight loss, diet, and exercise Recommended diet heavy in fruits and veggies and low in animal meats, cheeses, and dairy products, appropriate calorie intake Discussed ideal weight for height Will follow up in 3 months  Vitamin D Def At goal at last visit; continue supplementation to maintain goal of 70-100  Tobacco use Discussed risks associated with tobacco use and advised to reduce or quit Patient is ready to do so and plans to quit cold [place]; has success with this in the past Declines Chantix or other medication; resources given Will follow up at the next visit  COPD Advised to stop smoking - risks discussed. Declines medications.  Currently stable without respiratory medications  R BTK amputation ([name]) Stump healed well; working with PT, fitting prosthesis soon He is in good spirits; has improved glucose control tremendously   Continue diet and meds as discussed. Further disposition pending results of labs. Discussed med's effects and SE's.   Over 30 minutes of exam, counseling, chart review, and  critical decision making was performed.   Future Appointments  Date Time Provider Department Center  11/10/2019  1:40 PM Raulkar, Drema Pry, MD [facility] CPR  11/11/2019  1:30 PM Persons, [place], [place] [facility] None  11/15/2019  1:45 PM Fraser Din, PT [facility] None  01/20/2020  4:00 PM Lucky Cowboy, MD [facility] None  07/19/2020  3:00 PM Lucky Cowboy, MD [facility] None    ----------------------------------------------------------------------------------------------------------------------  HPI 59 y.o. male  presents for 3 month follow up on hypertension, cholesterol, diabetes, weight and vitamin D deficiency.  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 He has followed up ortho, very pleased with progress, good spirits, doing PT and got prosthesis fitting today.   he currently continues to smoke 1 pack a day x 45 years; discussed risks associated with smoking, patient is ready to quit. Has quit cold [place] before, declines meds.   BMI is There is no height or weight on file to calculate BMI., he has been working on diet and exercise, working with PT, watching portions, Wt Readings from Last 3 Encounters:  10/14/19 213 lb (96.6 kg)  09/30/19 213 lb (96.6 kg)  09/28/19 213 lb (96.6 kg)   His blood pressure has been controlled at home, today their BP is BP: 102/60  He does workout. He denies chest pain, shortness of breath, dizziness.   He is on cholesterol medication (has been prescribed crestor 20 mg daily, but hasn't been taking)  and denies myalgias. His cholesterol is not at goal. The cholesterol last visit was:   Lab Results  Component  Value Date   CHOL 184 07/19/2019   HDL 29 (L) 07/19/2019   LDLCALC 104 (H) 07/19/2019   TRIG 384 (H) 07/19/2019   CHOLHDL 6.3 (H) 07/19/2019    He has been working on diet and exercise for T2 diabetes  Neuropathy  in bil feet, some numbness and weakness in hands and feet, was on gabapentin but recently improved Hyperlipidemia- stopped crestor 20 mg, not sure why, willing to restart  On Glipizide 5 mg once daily with breakfast, denies hypoglycemia On metformin 500 mg BID denies foot ulcerations, increased appetite, nausea, polydipsia, polyuria, visual disturbances, vomiting and weight loss.  He checks sugars occasionally, 80-120 Last A1C in the office was:  Lab Results  Component Value Date   HGBA1C 6.6 (H) 08/26/2019   Lab Results  Component Value Date   GFRNONAA >60 09/06/2019   Patient is on Vitamin D supplement and at goal at recent check:    Lab Results  Component Value Date   VD25OH 109 (H) 07/19/2019         Current Medications:  Current Outpatient Medications on File Prior to Visit  Medication Sig  . aspirin 81 MG tablet Take 81 mg by mouth daily.  . citalopram (CELEXA) 40 MG tablet Take 1 tablet Daily for Mood & Anxiety  . Continuous Blood Gluc Sensor (FREESTYLE LIBRE SENSOR SYSTEM) MISC 1 each by Does not apply route daily.  Marland Kitchen glipiZIDE (GLUCOTROL) 5 MG tablet Take 1 tablet (5 mg total) by mouth 2 (two) times daily before a meal.  . hydrochlorothiazide (HYDRODIURIL) 25 MG tablet Take 1 tablet by mouth once daily  . lisinopril (ZESTRIL) 20 MG tablet Take 1 tablet    Daily   for BP & Diabetic Kidney Protection  . metFORMIN (GLUCOPHAGE) 500 MG tablet Take 1 tablet (500 mg total) by mouth 2 (two) times daily with a meal.  . verapamil (CALAN-SR) 240 MG CR tablet Take 1 tablet Daily with Food for BP & Heart  Rhythm   No current facility-administered medications on file prior to visit.     Allergies:  Allergies  Allergen Reactions  . Invokamet [Canagliflozin-Metformin Hcl] Other (See Comments)    Extremity edema/caused pain  . Invokana [Canagliflozin] Other (See Comments)    Extremity edema/caused pain  . Codeine Camsylate [Codeine] Rash  . Morphine And Related Rash      Medical History:  Past Medical History:  Diagnosis Date  . Diabetic neuropathy ([name])   . Diverticulitis   . History of hepatitis C    has been treated in the past  . History of kidney stones   . Hyperlipidemia   . Hypertension   . 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[name])   . Other testicular hypofunction   . Type II or unspecified type diabetes mellitus without mention of complication, not stated as uncontrolled   . Vitamin D deficiency    Family history- Reviewed and unchanged Social history- Reviewed and unchanged   Review of Systems:  Review of Systems  Constitutional: Negative for malaise/fatigue and weight loss.  HENT: Negative for hearing loss and tinnitus.   Eyes: Negative for blurred vision and double vision.  Respiratory: Negative for cough, shortness of breath and wheezing.   Cardiovascular: Negative for chest pain, palpitations, orthopnea, claudication and leg swelling.  Gastrointestinal: Negative for abdominal pain, blood in stool, constipation, diarrhea, heartburn, melena, nausea and vomiting.  Genitourinary: Negative.   Musculoskeletal: Negative for joint pain and myalgias.  Skin: Negative for rash.  Neurological: Negative for  dizziness, tingling, sensory change, weakness and headaches.  Endo/Heme/Allergies: Negative for polydipsia.  Psychiatric/Behavioral: Negative for depression, hallucinations, memory loss and substance abuse. The patient is not nervous/anxious.   All other systems reviewed and are negative.    Physical Exam: BP 102/60   Pulse 64   Temp (!) 96.2 F (35.7 C)   SpO2 98%  Wt Readings from Last 3 Encounters:  10/14/19 213 lb (96.6 kg)  09/30/19 213 lb (96.6 kg)  09/28/19 213 lb (96.6 kg)   General Appearance: Well nourished, in no apparent distress. Eyes: PERRLA, EOMs, conjunctiva no swelling or erythema Sinuses: No Frontal/maxillary tenderness ENT/Mouth: Ext aud canals clear, TMs without erythema, bulging. No erythema, swelling, or  exudate on post pharynx.  Tonsils not swollen or erythematous. Hearing normal.  Neck: Supple, thyroid normal.  Respiratory: Respiratory effort normal, BS equal bilaterally with diffuse wheezing without rales, rhonchi,  or stridor.  Cardio: RRR with no MRGs. Brisk peripheral pulses without edema.  Abdomen: Soft, + BS.  Non tender, no guarding, rebound, hernias, masses. Lymphatics: Non tender without lymphadenopathy.  Musculoskeletal: in wheelchair; gait not assessed; R BTK amputation with well healed stump. Otherwise no deformity. Symmetrical upper extremity strength.  Skin: Warm, dry without rashes, lesions, ecchymosis. He has superficial open sore to L mid foot arch; scant purulent discharge, no erythema; per patient improving steadily over last week.  Neuro: Cranial nerves intact. No cerebellar symptoms.  Psych: Awake and oriented X 3, normal affect, Insight and Judgment appropriate.    Dan Maker, NP 5:13 PM [facility]

## 2019-10-22 ENCOUNTER — Other Ambulatory Visit: Payer: Self-pay | Admitting: Adult Health

## 2019-10-22 LAB — CBC WITH DIFFERENTIAL/PLATELET
Absolute Monocytes: 1128 cells/uL — ABNORMAL HIGH (ref 200–950)
Basophils Absolute: 108 cells/uL (ref 0–200)
Basophils Relative: 0.9 %
Eosinophils Absolute: 192 cells/uL (ref 15–500)
Eosinophils Relative: 1.6 %
HCT: 44 % (ref 38.5–50.0)
Hemoglobin: 14.9 g/dL (ref 13.2–17.1)
Lymphs Abs: 5016 cells/uL — ABNORMAL HIGH (ref 850–3900)
MCH: 31.6 pg (ref 27.0–33.0)
MCHC: 33.9 g/dL (ref 32.0–36.0)
MCV: 93.2 fL (ref 80.0–100.0)
MPV: 11.7 fL (ref 7.5–12.5)
Monocytes Relative: 9.4 %
Neutro Abs: 5556 cells/uL (ref 1500–7800)
Neutrophils Relative %: 46.3 %
Platelets: 252 10*3/uL (ref 140–400)
RBC: 4.72 10*6/uL (ref 4.20–5.80)
RDW: 14.5 % (ref 11.0–15.0)
Total Lymphocyte: 41.8 %
WBC: 12 10*3/uL — ABNORMAL HIGH (ref 3.8–10.8)

## 2019-10-22 LAB — TSH: TSH: 2.14 mIU/L (ref 0.40–4.50)

## 2019-10-22 LAB — COMPLETE METABOLIC PANEL WITH GFR
AG Ratio: 1.2 (calc) (ref 1.0–2.5)
ALT: 21 U/L (ref 9–46)
AST: 19 U/L (ref 10–35)
Albumin: 4.1 g/dL (ref 3.6–5.1)
Alkaline phosphatase (APISO): 79 U/L (ref 35–144)
BUN: 22 mg/dL (ref 7–25)
CO2: 27 mmol/L (ref 20–32)
Calcium: 9.9 mg/dL (ref 8.6–10.3)
Chloride: 100 mmol/L (ref 98–110)
Creat: 0.84 mg/dL (ref 0.70–1.25)
GFR, Est African American: 110 mL/min/{1.73_m2} (ref >=60)
GFR, Est Non African American: 95 mL/min/{1.73_m2} (ref >=60)
Globulin: 3.4 g/dL (calc) (ref 1.9–3.7)
Glucose, Bld: 93 mg/dL (ref 65–99)
Potassium: 4.6 mmol/L (ref 3.5–5.3)
Sodium: 136 mmol/L (ref 135–146)
Total Bilirubin: 0.4 mg/dL (ref 0.2–1.2)
Total Protein: 7.5 g/dL (ref 6.1–8.1)

## 2019-10-22 LAB — LIPID PANEL
Cholesterol: 219 mg/dL — ABNORMAL HIGH (ref <200)
HDL: 34 mg/dL — ABNORMAL LOW (ref >=40)
LDL Cholesterol (Calc): 140 mg/dL (calc) — ABNORMAL HIGH
Non-HDL Cholesterol (Calc): 185 mg/dL (calc) — ABNORMAL HIGH (ref <130)
Total CHOL/HDL Ratio: 6.4 (calc) — ABNORMAL HIGH (ref <5.0)
Triglycerides: 292 mg/dL — ABNORMAL HIGH (ref <150)

## 2019-10-22 LAB — MAGNESIUM: Magnesium: 2 mg/dL (ref 1.5–2.5)

## 2019-10-22 LAB — HEMOGLOBIN A1C
Hgb A1c MFr Bld: 5.4 % of total Hgb (ref <5.7)
Mean Plasma Glucose: 108 (calc)
eAG (mmol/L): 6 (calc)

## 2019-10-22 MED ORDER — GLIPIZIDE 5 MG PO TABS: ORAL_TABLET | ORAL | 0 refills | Status: DC

## 2019-10-25 ENCOUNTER — Telehealth: Payer: Self-pay

## 2019-10-25 ENCOUNTER — Encounter: Payer: BC Managed Care – PPO | Admitting: Physical Therapy

## 2019-10-25 ENCOUNTER — Other Ambulatory Visit: Payer: Self-pay | Admitting: Adult Health

## 2019-10-25 DIAGNOSIS — E785 Hyperlipidemia, unspecified: Secondary | ICD-10-CM

## 2019-10-25 MED ORDER — DOXYCYCLINE HYCLATE 100 MG PO CAPS: ORAL_CAPSULE | ORAL | 0 refills | Status: DC

## 2019-10-25 MED ORDER — ROSUVASTATIN CALCIUM 40 MG PO TABS: ORAL_TABLET | ORAL | 2 refills | Status: DC

## 2019-10-25 NOTE — Telephone Encounter (Signed)
Refill request for Rosuvastatin. Also, states that he was offered a prescription for Doxycycline and but declined earlier but now has changed his mind. Please send prescriptions to [facility].

## 2019-10-26 ENCOUNTER — Other Ambulatory Visit: Payer: Self-pay | Admitting: Internal Medicine

## 2019-10-26 DIAGNOSIS — E785 Hyperlipidemia, unspecified: Secondary | ICD-10-CM

## 2019-10-26 NOTE — Telephone Encounter (Signed)
Patient notified

## 2019-11-05 ENCOUNTER — Other Ambulatory Visit: Payer: Self-pay | Admitting: Internal Medicine

## 2019-11-05 DIAGNOSIS — E785 Hyperlipidemia, unspecified: Secondary | ICD-10-CM | POA: Diagnosis not present

## 2019-11-05 DIAGNOSIS — E559 Vitamin D deficiency, unspecified: Secondary | ICD-10-CM | POA: Diagnosis not present

## 2019-11-05 DIAGNOSIS — E1142 Type 2 diabetes mellitus with diabetic polyneuropathy: Secondary | ICD-10-CM | POA: Diagnosis not present

## 2019-11-05 DIAGNOSIS — R3915 Urgency of urination: Secondary | ICD-10-CM | POA: Diagnosis not present

## 2019-11-05 DIAGNOSIS — D62 Acute posthemorrhagic anemia: Secondary | ICD-10-CM | POA: Diagnosis not present

## 2019-11-05 DIAGNOSIS — Z79891 Long term (current) use of opiate analgesic: Secondary | ICD-10-CM | POA: Diagnosis not present

## 2019-11-05 DIAGNOSIS — Z9181 History of falling: Secondary | ICD-10-CM | POA: Diagnosis not present

## 2019-11-05 DIAGNOSIS — F17210 Nicotine dependence, cigarettes, uncomplicated: Secondary | ICD-10-CM | POA: Diagnosis not present

## 2019-11-05 DIAGNOSIS — Z89511 Acquired absence of right leg below knee: Secondary | ICD-10-CM | POA: Diagnosis not present

## 2019-11-05 DIAGNOSIS — Z87442 Personal history of urinary calculi: Secondary | ICD-10-CM | POA: Diagnosis not present

## 2019-11-05 DIAGNOSIS — K5900 Constipation, unspecified: Secondary | ICD-10-CM | POA: Diagnosis not present

## 2019-11-05 DIAGNOSIS — I10 Essential (primary) hypertension: Secondary | ICD-10-CM | POA: Diagnosis not present

## 2019-11-05 DIAGNOSIS — Z7982 Long term (current) use of aspirin: Secondary | ICD-10-CM | POA: Diagnosis not present

## 2019-11-05 DIAGNOSIS — Z4781 Encounter for orthopedic aftercare following surgical amputation: Secondary | ICD-10-CM | POA: Diagnosis not present

## 2019-11-05 DIAGNOSIS — Z7984 Long term (current) use of oral hypoglycemic drugs: Secondary | ICD-10-CM | POA: Diagnosis not present

## 2019-11-05 MED ORDER — METFORMIN HCL ER 500 MG PO TB24: ORAL_TABLET | ORAL | 0 refills | Status: DC

## 2019-11-10 ENCOUNTER — Other Ambulatory Visit: Payer: Self-pay

## 2019-11-10 ENCOUNTER — Encounter
Payer: BC Managed Care – PPO | Attending: Physical Medicine and Rehabilitation | Admitting: Physical Medicine and Rehabilitation

## 2019-11-10 ENCOUNTER — Encounter: Payer: Self-pay | Admitting: Physical Medicine and Rehabilitation

## 2019-11-10 VITALS — BP 128/74 | HR 75 | Temp 98.2°F | Ht 75.0 in | Wt 247.4 lb

## 2019-11-10 DIAGNOSIS — Z89511 Acquired absence of right leg below knee: Secondary | ICD-10-CM | POA: Insufficient documentation

## 2019-11-10 NOTE — Progress Notes (Signed)
Subjective:    Patient ID: [name], male    DOB: 19-Jul-1960, 59 y.o.   MRN: [id number]  HPI  [name] is a 59 year old man who presents for follow-up after BKA.   The two sores on left foot are not quite gone yet. Finished antibiotics yesterday.   He does not feel too much physical limitations. He stopped his home therapy.  Gets his leg on Wednesday. He tried on the model leg last week. Walked 30 feet, did not need parallel bars.   Feels he neglected the upper extremity exercises.   Denies constipation or pan.   CBGs well controlled.   Prior history:  Has been working on leg strength, shoulder strength, walking. Has walked 15 feet down the hall. Doe not feel very comfortable with the walker. He does standing exercises- knee bending. Has not walked outside of home at home.   Little to no pain. Had a little bit when he moved to a smaller shrinker.   Denies constipation.   He has had follow-up with Dr. Lajoyce Corners for staple removal.   Sores on left foot are almost gone.   Sugars have been 135 at home.    Pain Inventory Average Pain 0 Pain Right Now 0 My pain is na  In the last 24 hours, has pain interfered with the following? General activity 0 Relation with others 0 Enjoyment of life 0 What TIME of day is your pain at its worst? evening, night  Sleep (in general) Fair  Pain is worse with: na Pain improves with: na Relief from Meds: na     Family History  Problem Relation Age of Onset  . Hypertension Mother   . Cancer Father        colon  . Alzheimer's disease Father   . Stroke Father    Social History   Socioeconomic History  . Marital status: Married    Spouse name: Not on file  . Number of children: Not on file  . Years of education: Not on file  . Highest education level: Not on file  Occupational History  . Not on file  Tobacco Use  . Smoking status: Current Every Day Smoker    Packs/day: 1.00    Types: Cigarettes  . Smokeless tobacco:  Never Used  Substance and Sexual Activity  . Alcohol use: No  . Drug use: Yes    Types: Marijuana  . Sexual activity: Not on file  Other Topics Concern  . Not on file  Social History Narrative  . Not on file   Social Determinants of Health   Financial Resource Strain:   . Difficulty of Paying Living Expenses: Not on file  Food Insecurity:   . Worried About Programme researcher, broadcasting/film/video in the Last Year: Not on file  . Ran Out of Food in the Last Year: Not on file  Transportation Needs:   . Lack of Transportation (Medical): Not on file  . Lack of Transportation (Non-Medical): Not on file  Physical Activity:   . Days of Exercise per Week: Not on file  . Minutes of Exercise per Session: Not on file  Stress:   . Feeling of Stress : Not on file  Social Connections:   . Frequency of Communication with Friends and Family: Not on file  . Frequency of Social Gatherings with Friends and Family: Not on file  . Attends Religious Services: Not on file  . Active Member of Clubs or Organizations: Not on  file  . Attends Banker Meetings: Not on file  . Marital Status: Not on file   Past Surgical History:  Procedure Laterality Date  . AMPUTATION Right 09/01/2019   Procedure: RIGHT BELOW KNEE AMPUTATION;  Surgeon: Nadara Mustard, MD;  Location: [facility] OR;  Service: Orthopedics;  Laterality: Right;  . CHOLECYSTECTOMY  1987  . COLONOSCOPY    . I & D EXTREMITY Right 08/20/2019   Procedure: IRRIGATION & DEBRIDEMENT RIGHT FOOT PARTIAL CALCANEAL EXCISION;  Surgeon: Nadara Mustard, MD;  Location: [facility] OR;  Service: Orthopedics;  Laterality: Right;  . I & D EXTREMITY Right 08/27/2019   Procedure: IRRIGATION AND DEBRIDEMENT  OF FOOT;  Surgeon: Tarry Kos, MD;  Location: [facility] OR;  Service: Orthopedics;  Laterality: Right;  . ORIF TIBIA FRACTURE     x 3 right leg   Past Medical History:  Diagnosis Date  . Diabetic neuropathy ([name])   . Diverticulitis   . History of hepatitis C    has been treated in  the past  . History of kidney stones   . Hyperlipidemia   . Hypertension   . 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[name])   . Other testicular hypofunction   . Severe sepsis ([name]) 08/26/2019  . Type II or unspecified type diabetes mellitus without mention of complication, not stated as uncontrolled   . Vitamin D deficiency    BP 128/74   Pulse 75   Temp 98.2 F (36.8 C)   SpO2 96%   Opioid Risk Score:   Fall Risk Score:  `1  Depression screen PHQ 2/9  Depression screen [facility] 2/9 09/30/2019 08/04/2019 07/18/2019 01/20/2019 11/02/2017 10/08/2017 04/05/2017  Decreased Interest 0 0 0 0 0 0 0  Down, Depressed, Hopeless 0 0 0 0 0 0 0  PHQ - 2 Score 0 0 0 0 0 0 0  Altered sleeping 0 - - - - - -  Tired, decreased energy 1 - - - - - -  Change in appetite 0 - - - - - -  Feeling bad or failure about yourself  0 - - - - - -  Trouble concentrating 0 - - - - - -  Moving slowly or fidgety/restless 0 - - - - - -  Suicidal thoughts 0 - - - - - -  PHQ-9 Score 1 - - - - - -  Difficult doing work/chores Not difficult at all - - - - - -   Review of Systems  Neurological: Positive for numbness.  All other systems reviewed and are negative.      Objective:   Physical Exam Gen: no distress, normal appearing HEENT: oral mucosa pink and moist, NCAT Cardio: Reg rate Chest: normal effort, normal rate of breathing Abd: soft, non-distended Ext: no edema Skin: two sores on left ankle have been stable.  Neuro: Alert and oriented x3.  Musculoskeletal: 5/5 strength throughout. R BKA in shrinker.  Psych: pleasant, normal affect    Assessment & Plan:  1) Impaired mobility and ADLs: -transition to outpatient therapy.  -advised increasing walking to every hour.  -will provide script for a second shrinker and fax along with note to [employer].  -I will fill out his disability paperwork for him. Jan 1st return to work diet. 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 -He wants to get comfortable with his leg before  returning to work.    2) Diabetes -prescribed freestyle libre device. Discussed how this can be  advantageous to check sugars multiples times per day after eating, to determine which foods most spike his sugars.  -avoid muffins and vegetable oil.  -eggs are great, which he eats daily fore breakfast -reviewed diet, and it is much better!  3) R BKA -he will get prosthesis next week -Has such a great attitude! -Had follow-up with Dr. Lajoyce Corners and staples were removed.   4) Obesity: -Educated that current weight is 213 lbs and current BMI is 26.62 lbs -Educated regarding health benefits of weight loss- for pain, general health, immune health, mental health.  -Will monitor weight every visit.  -Made goal to increase daily walking to 20 feet, eliminate vegetable oils and muffins.   5) Fatigue: Lifestyle modifications as above will help to improve energy as well.

## 2019-11-11 ENCOUNTER — Ambulatory Visit (INDEPENDENT_AMBULATORY_CARE_PROVIDER_SITE_OTHER): Payer: BC Managed Care – PPO | Admitting: Physician Assistant

## 2019-11-11 ENCOUNTER — Encounter: Payer: Self-pay | Admitting: Physician Assistant

## 2019-11-11 VITALS — Ht 75.0 in | Wt 247.0 lb

## 2019-11-11 DIAGNOSIS — Z89511 Acquired absence of right leg below knee: Secondary | ICD-10-CM

## 2019-11-11 NOTE — Progress Notes (Signed)
Office Visit Note   Patient: [name]           Date of Birth: 06-29-1960           MRN: [id number] Visit Date: 11/11/2019              Requested by: Lucky Cowboy, MD [address] [place],  [place] [address] PCP: Lucky Cowboy, MD  Chief Complaint  Patient presents with  . Right Leg - Routine Post Op    09/01/19 [name]       HPI: This is a pleasant gentleman who is 2-1/2 months status post right below-knee amputation.  He is very pleased with his progress.  He has already stood on his prosthetic and is in the final stages of being fashioned form.  He is due to pick it up next week also due to begin physical therapy  Assessment & Plan: Visit Diagnoses: No diagnosis found.  Plan: He will obtain his prosthetic and work with PT follow-up for final visit hopefully in 1 month  Follow-Up Instructions: No follow-ups on file.   Ortho Exam  Patient is alert, oriented, no adenopathy, well-dressed, normal affect, normal respiratory effort. Right below-knee amputation stump is completely healed.  He has full knee range of motion.  Swelling is very well controlled and he is wearing multiple shrinkers.  No cellulitis or signs of infection Imaging: No results found. No images are attached to the encounter.  Labs: Lab Results  Component Value Date   HGBA1C 5.4 10/21/2019   HGBA1C 6.6 (H) 08/26/2019   HGBA1C 6.7 (H) 07/19/2019   REPTSTATUS 09/06/2019 FINAL 09/01/2019   GRAMSTAIN  09/01/2019    MODERATE WBC PRESENT, PREDOMINANTLY PMN NO ORGANISMS SEEN    CULT  09/01/2019    No growth aerobically or anaerobically. Performed at [facility], [address] [place], [place] [address]    [facility] STREPTOCOCCUS ANGINOSIS 08/20/2019     Lab Results  Component Value Date   ALBUMIN 2.0 (L) 09/06/2019   ALBUMIN 2.0 (L) 08/26/2019   ALBUMIN 2.8 (L) 08/20/2019    Lab Results  Component Value Date   MG 2.0 10/21/2019   MG 2.0 09/03/2019   MG  1.8 08/26/2019   Lab Results  Component Value Date   VD25OH 109 (H) 07/19/2019   VD25OH 62 04/20/2019   VD25OH 51 01/20/2019    No results found for: PREALBUMIN CBC EXTENDED Latest Ref Rng & Units 10/21/2019 09/06/2019 09/03/2019  WBC 3.8 - 10.8 Thousand/uL 12.0(H) 10.3 9.4  RBC 4.20 - 5.80 Million/uL 4.72 3.81(L) 3.69(L)  HGB 13.2 - 17.1 g/dL 92.3 11.8(L) 11.6(L)  HCT 38 - 50 % 44.0 36.5(L) 35.2(L)  PLT 140 - 400 Thousand/uL 252 317 356  NEUTROABS 1,500 - 7,800 cells/uL 5,556 6.1 -  LYMPHSABS 850 - 3,900 cells/uL 5,016(H) 3.1 -     Body mass index is 30.87 kg/m.  Orders:  No orders of the defined types were placed in this encounter.  No orders of the defined types were placed in this encounter.    Procedures: No procedures performed  Clinical Data: No additional findings.  ROS:  All other systems negative, except as noted in the HPI. Review of Systems  Objective: Vital Signs: Ht 6\' 3"  (1.905 m)   Wt 247 lb (112 kg)   BMI 30.87 kg/m   Specialty Comments:  No specialty comments available.  PMFS History: Patient Active Problem List   Diagnosis Date Noted  . Right  below-knee amputee ([name]) 09/03/2019  . Hyponatremia 08/26/2019  . Cutaneous abscess of right foot   . Poorly controlled diabetes mellitus ([name]) 07/16/2019  . FHx: heart disease 10/08/2017  . Type 2 diabetes mellitus with stage 2 chronic kidney disease, without long-term current use of insulin ([name]) 10/08/2017  . Insomnia 07/08/2017  . Smoker 08/17/2015  . COPD (chronic obstructive pulmonary disease) ([name]) 08/17/2015  . 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 Medication management 05/19/2013  . Morbid obesity (BMI 35) 05/19/2013  . Essential hypertension   . Hyperlipidemia associated with type 2 diabetes mellitus ([name])   . Vitamin D deficiency   . History of kidney stones   . Testosterone deficiency   . History of hepatitis  C    Past Medical History:  Diagnosis Date  . Diabetic neuropathy ([name])   . Diverticulitis   . History of hepatitis C    has been treated in the past  . History of kidney stones   . Hyperlipidemia   . Hypertension   . 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[name])   . Other testicular hypofunction   . Severe sepsis ([name]) 08/26/2019  . Type II or unspecified type diabetes mellitus without mention of complication, not stated as uncontrolled   . Vitamin D deficiency     Family History  Problem Relation Age of Onset  . Hypertension Mother   . Cancer Father        colon  . Alzheimer's disease Father   . Stroke Father     Past Surgical History:  Procedure Laterality Date  . AMPUTATION Right 09/01/2019   Procedure: RIGHT BELOW KNEE AMPUTATION;  Surgeon: Nadara Mustard, MD;  Location: [facility] OR;  Service: Orthopedics;  Laterality: Right;  . CHOLECYSTECTOMY  1987  . COLONOSCOPY    . I & D EXTREMITY Right 08/20/2019   Procedure: IRRIGATION & DEBRIDEMENT RIGHT FOOT PARTIAL CALCANEAL EXCISION;  Surgeon: Nadara Mustard, MD;  Location: [facility] OR;  Service: Orthopedics;  Laterality: Right;  . I & D EXTREMITY Right 08/27/2019   Procedure: IRRIGATION AND DEBRIDEMENT  OF FOOT;  Surgeon: Tarry Kos, MD;  Location: [facility] OR;  Service: Orthopedics;  Laterality: Right;  . ORIF TIBIA FRACTURE     x 3 right leg   Social History   Occupational History  . Not on file  Tobacco Use  . Smoking status: Current Every Day Smoker    Packs/day: 1.00    Types: Cigarettes  . Smokeless tobacco: Never Used  Substance and Sexual Activity  . Alcohol use: No  . Drug use: Yes    Types: Marijuana  . Sexual activity: Not on file

## 2019-11-14 DIAGNOSIS — Z89511 Acquired absence of right leg below knee: Secondary | ICD-10-CM | POA: Diagnosis not present

## 2019-11-15 ENCOUNTER — Encounter: Payer: BC Managed Care – PPO | Admitting: Physical Therapy

## 2019-11-15 ENCOUNTER — Other Ambulatory Visit: Payer: Self-pay

## 2019-11-17 ENCOUNTER — Other Ambulatory Visit: Payer: Self-pay

## 2019-11-17 ENCOUNTER — Ambulatory Visit: Payer: BC Managed Care – PPO | Admitting: Physical Therapy

## 2019-11-17 ENCOUNTER — Encounter: Payer: Self-pay | Admitting: Physical Therapy

## 2019-11-17 DIAGNOSIS — R2689 Other abnormalities of gait and mobility: Secondary | ICD-10-CM

## 2019-11-17 DIAGNOSIS — M6281 Muscle weakness (generalized): Secondary | ICD-10-CM

## 2019-11-17 DIAGNOSIS — R2681 Unsteadiness on feet: Secondary | ICD-10-CM | POA: Diagnosis not present

## 2019-11-17 DIAGNOSIS — Z89511 Acquired absence of right leg below knee: Secondary | ICD-10-CM | POA: Diagnosis not present

## 2019-11-17 DIAGNOSIS — R293 Abnormal posture: Secondary | ICD-10-CM

## 2019-11-17 NOTE — Therapy (Signed)
[facility] Physical Therapy [address] [place], [place], [address] Phone: [phone]   Fax:  [phone]  Physical Therapy Evaluation  Patient Details  Name: [name] MRN: [id number] Date of Birth: 06/27/1960 Referring Provider (PT): West Bali Persons, [place]   Encounter Date: 11/17/2019   PT End of Session - 11/17/19 2101    Visit Number 1    Number of Visits 17    Authorization Type BCBS comm    Authorization Time Period $35 co-pay    PT Start Time 1145    PT Stop Time 1229    PT Time Calculation (min) 44 min    Equipment Utilized During Treatment Gait belt    Activity Tolerance Patient tolerated treatment well    Behavior During Therapy [facility] for tasks assessed/performed           Past Medical History:  Diagnosis Date  . Diabetic neuropathy ([name])   . Diverticulitis   . History of hepatitis C    has been treated in the past  . History of kidney stones   . Hyperlipidemia   . Hypertension   . 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[name])   . Other testicular hypofunction   . Severe sepsis ([name]) 08/26/2019  . Type II or unspecified type diabetes mellitus without mention of complication, not stated as uncontrolled   . Vitamin D deficiency     Past Surgical History:  Procedure Laterality Date  . AMPUTATION Right 09/01/2019   Procedure: RIGHT BELOW KNEE AMPUTATION;  Surgeon: Nadara Mustard, MD;  Location: [facility] OR;  Service: Orthopedics;  Laterality: Right;  . CHOLECYSTECTOMY  1987  . COLONOSCOPY    . I & D EXTREMITY Right 08/20/2019   Procedure: IRRIGATION & DEBRIDEMENT RIGHT FOOT PARTIAL CALCANEAL EXCISION;  Surgeon: Nadara Mustard, MD;  Location: [facility] OR;  Service: Orthopedics;  Laterality: Right;  . I & D EXTREMITY Right 08/27/2019   Procedure: IRRIGATION AND DEBRIDEMENT  OF FOOT;  Surgeon: Tarry Kos, MD;  Location: [facility] OR;  Service: Orthopedics;  Laterality: Right;  . ORIF TIBIA FRACTURE     x 3 right leg    There were no vitals filed for this  visit.    Subjective Assessment - 11/17/19 1154    Subjective This 59yo male was referred on 10/14/2019 to PT with Right Below Knee Amputation (U72.536U). He underwent a Transtibial Amputation on 09/01/2019 due to osteomyelitis. He recieved his prosthesis 11/17/2019 prior to PT evaluation.    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 No/denies              [facility] PT Assessment - 11/17/19 1145      Assessment   Medical Diagnosis Right Transtibial Amputation    Referring Provider (PT) West Bali Persons, PA    Onset Date/Surgical Date 11/17/19   prosthesis delivery   Hand Dominance Left    Prior Therapy Inpatient rehab & HHPT until 2-3 weeks ago.       Precautions   Precautions Fall      Balance Screen   Has the patient fallen in the past 6 months No    Has the patient had a decrease in activity level because of a fear of falling?  No  Is the patient reluctant to leave their home because of a fear of falling?  No      Home Environment   Living Environment Private residence    Living Arrangements Spouse/significant other   51# dog & cat   Type of Home House    Home Access Ramped entrance;Stairs to enter   2nd entrance 2 steps no rails.   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     Leisure fishing, travelling, ride  motorcycle      Posture/Postural Control   Posture/Postural Control Postural limitations    Postural Limitations Rounded Shoulders;Forward head;Flexed trunk;Weight shift left      ROM / Strength   AROM / PROM / Strength AROM;Strength      AROM   Overall AROM  Within functional limits for tasks performed      Strength   Overall Strength Within functional limits for tasks performed      Transfers   Transfers Sit to Stand;Stand to Sit    Sit to Stand 5: Supervision;With upper extremity assist;With armrests;From chair/3-in-1;Other (comment)   requires RW support to stabilize   Stand to Sit 5: Supervision;With upper extremity assist;With armrests;To chair/3-in-1;Other (comment)   requires RW for stability     Ambulation/Gait   Ambulation/Gait Yes    Ambulation/Gait Assistance 5: Supervision    Ambulation/Gait Assistance Details excessive UE weight bearing on RW    Ambulation Distance (Feet) 100 Feet    Assistive device Rolling walker;Prosthesis    Gait Pattern Step-to pattern;Decreased step length - left;Decreased stance time - right;Decreased stride length;Decreased hip/knee flexion - right;Decreased weight shift to right;Right hip hike;Right circumduction;Antalgic;Lateral hip instability;Trunk flexed;Abducted- right    Ambulation Surface Level;Indoor    Gait velocity 0.98 ft/sec      Standardized Balance Assessment   Standardized Balance Assessment Berg Balance Test      Berg Balance Test   Sit to Stand Needs minimal aid to stand or to stabilize    Standing Unsupported Needs several tries to stand 30 seconds unsupported    Sitting with Back Unsupported but Feet Supported on Floor or Stool Able to sit safely and securely 2 minutes    Stand to Sit Controls descent by using hands    Transfers Able to transfer safely, definite need of hands    Standing Unsupported with Eyes Closed Unable to keep eyes closed 3 seconds but stays steady    Standing Unsupported with Feet Together Needs  help to attain position but able to stand for 30 seconds with feet together    From Standing, Reach Forward with Outstretched Arm Reaches forward but needs supervision    From Standing Position, Pick up Object from Floor Unable to try/needs assist to keep balance    From Standing Position, Turn to Look Behind Over each Shoulder Needs supervision when turning    Turn 360 Degrees Needs assistance while turning    Standing Unsupported, Alternately Place Feet on Step/Stool Needs assistance to keep from falling or unable to try    Standing Unsupported, One  Foot in [employer] balance while stepping or standing    Standing on One Leg Unable to try or needs assist to prevent fall    Total Score 16    Berg comment: <36/56 indicates dependency in standing ADLs & <45/56 high fall risk           Prosthetics Assessment - 11/17/19 1145      Prosthetics   Prosthetic Care Dependent with Skin check;Residual limb care;Care of non-amputated limb;Prosthetic cleaning;Ply sock cleaning;Correct ply sock adjustment;Proper wear schedule/adjustment;Proper weight-bearing schedule/adjustment    Donning prosthesis  Supervision    Doffing prosthesis  Supervision    Current prosthetic wear tolerance (days/week)  just recieved today    Current prosthetic wear tolerance (#hours/day)  1 hour    Current prosthetic weight-bearing tolerance (hours/day)  Patient tolerates standing 5 min with partial weight on prosthesis without pain or limb discomfort    Edema pitting edema    Residual limb condition  no open areas, cylinderical shape, good hair growth, normal color & temperature, sweaty skin    Prosthesis Description silicon liner with shuttle pin lock, total contact socket, dynamic response foot    K code/activity level with prosthetic use  K3 full community with variable cadence                     Objective measurements completed on examination: See above findings.       [facility] Adult PT  Treatment/Exercise - 11/17/19 1145      Prosthetics   Prosthetic Care Comments  initiate wear 2 hrs 3x/day and increase q5 days if no issues with skin or pain,      Education Provided Skin check;Residual limb care;Prosthetic cleaning;Ply sock cleaning;Correct ply sock adjustment;Proper Donning;Proper Doffing;Proper wear schedule/adjustment;Proper weight-bearing schedule/adjustment;Other (comment)   see prosthetic care comments   Person(s) Educated Patient;Spouse    Education Method Explanation;Demonstration;Tactile cues;Verbal cues    Education Method Verbalized understanding;Tactile cues required;Verbal cues required;Needs further instruction                    PT Short Term Goals - 11/17/19 2121      PT SHORT TERM GOAL #1   Title Patient demonstrates understanding of donning prosthesis, cleaning & adjusting ply socks. (All STGs Target Date: 12/15/2019)    Time 4    Period Weeks    Status New    Target Date 12/15/19      PT SHORT TERM GOAL #2   Title Patient tolerates prosthesis wear >12hrs/ day without skin issues.    Time 4    Period Weeks    Status New    Target Date 12/15/19      PT SHORT TERM GOAL #3   Title Patient able to stand 60sec without UE support and pick up item from floor with supervision.    Time 4    Period Weeks    Status New    Target Date 12/15/19      PT SHORT TERM GOAL #4   Title Patient ambulates 250' with cane & prosthesis with supervision.    Time 4    Period Weeks    Status New    Target Date 12/15/19      PT SHORT TERM GOAL #5   Title Patient negotiates stairs with single rail, ramps & curbs with cane & prosthesis with minA.    Time 4    Period Weeks    Status New    Target Date 12/15/19  PT Long Term Goals - 11/17/19 2114      PT LONG TERM GOAL #1   Title Patient verbalizes & demonstrates understanding of prosthetic care comments to enable safe utilization of prosthesis.  (All LTGs Target Date:  01/12/2020)     Time 8    Period Weeks    Status New    Target Date 01/12/20      PT LONG TERM GOAL #2   Title Patient tolerates prosthesis wear >90% of awake hours without skin or residual limb pain issues.    Time 8    Period Weeks    Status New    Target Date 01/12/20      PT LONG TERM GOAL #3   Title Berg Balance >/=45/56    Time 8    Period Weeks    Status New    Target Date 01/12/20      PT LONG TERM GOAL #4   Title Patient ambulates >500' including grass with cane or less and prosthesis modified independent.    Time 8    Period Weeks    Status New    Target Date 01/12/20      PT LONG TERM GOAL #5   Title Patient negotiates ramps, curbs & stairs with cane or less and prosthesis modified independent.    Time 8    Period Weeks    Status New    Target Date 01/12/20      Additional Long Term Goals   Additional Long Term Goals Yes      PT LONG TERM GOAL #6   Title Patient able to lift & carry 20# box and push / pull activities engaging prosthesis with proper technique.    Time 8    Period Weeks    Status New    Target Date 01/12/20                  Plan - 11/17/19 2106    Clinical Impression Statement This 59yo male underwent a Transtibial Amputation 11 weeks prior to PT evaluation. He recieved his first prosthesis this morning prior to PT. He is dependent in prosthetic care and limited wear.  Berg Balance score 16/56 indicates high fall risk & dependency in standing ADLs. His prosthetic gait is dependent on RW and gait deviations indicating fall risk.  He would benefit from skilled PT to improve function & safety with prosthesis.    Personal Factors and Comorbidities Comorbidity 3+;Past/Current Experience    Comorbidities TTA, DM2, HTN, Hep C,HLD,    Examination-Activity Limitations Carry;Lift;Locomotion Level;Reach Overhead;Squat;Stairs;Stand;Transfers    Examination-Participation Restrictions Community Activity;Driving;Occupation    Stability/Clinical Decision Making  Evolving/Moderate complexity    Clinical Decision Making Moderate    Rehab Potential Good    PT Frequency 2x / week    PT Duration 8 weeks    PT Treatment/Interventions ADLs/Self Care Home Management;DME Instruction;Gait training;Stair training;Functional mobility training;Therapeutic activities;Therapeutic exercise;Balance training;Neuromuscular re-education;Patient/family education;Prosthetic Training;Scar mobilization;Vestibular    PT Next Visit Plan review prosthetic care, HEP at sink, prosthetic gait with RW including ramps & curbs    Consulted and Agree with Plan of Care Patient;Family member/caregiver    Family Member Consulted wife, [name]           Patient will benefit from skilled therapeutic intervention in order to improve the following deficits and impairments:  Abnormal gait, Decreased activity tolerance, Decreased balance, Decreased endurance, Decreased knowledge of use of DME, Decreased mobility, Decreased skin integrity, Decreased strength, Dizziness, Increased edema, Postural  dysfunction, Prosthetic Dependency  Visit Diagnosis: Other abnormalities of gait and mobility  Unsteadiness on feet  Muscle weakness (generalized)  Abnormal posture     Problem List Patient Active Problem List   Diagnosis Date Noted  . Right below-knee amputee ([name]) 09/03/2019  . Hyponatremia 08/26/2019  . Cutaneous abscess of right foot   . Poorly controlled diabetes mellitus ([name]) 07/16/2019  . FHx: heart disease 10/08/2017  . Type 2 diabetes mellitus with stage 2 chronic kidney disease, without long-term current use of insulin ([name]) 10/08/2017  . Insomnia 07/08/2017  . Smoker 08/17/2015  . COPD (chronic obstructive pulmonary disease) ([name]) 08/17/2015  . 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 Medication management 05/19/2013  . Morbid obesity (BMI 35) 05/19/2013  . Essential hypertension   .  Hyperlipidemia associated with type 2 diabetes mellitus ([name])   . Vitamin D deficiency   . History of kidney stones   . Testosterone deficiency   . History of hepatitis C     Vladimir Faster, PT, DPT 11/17/2019, 9:28 PM  [facility] Physical Therapy [address] [place], [place], [address] Phone: [phone]   Fax:  [phone]  Name: [name] MRN: [id number] Date of Birth: Aug 22, 1960

## 2019-11-22 ENCOUNTER — Other Ambulatory Visit: Payer: Self-pay

## 2019-11-22 ENCOUNTER — Ambulatory Visit: Payer: BC Managed Care – PPO | Admitting: Physical Therapy

## 2019-11-22 ENCOUNTER — Encounter: Payer: Self-pay | Admitting: Physical Therapy

## 2019-11-22 DIAGNOSIS — R2681 Unsteadiness on feet: Secondary | ICD-10-CM

## 2019-11-22 DIAGNOSIS — M6281 Muscle weakness (generalized): Secondary | ICD-10-CM | POA: Diagnosis not present

## 2019-11-22 DIAGNOSIS — R293 Abnormal posture: Secondary | ICD-10-CM

## 2019-11-22 DIAGNOSIS — R2689 Other abnormalities of gait and mobility: Secondary | ICD-10-CM

## 2019-11-22 NOTE — Therapy (Signed)
[facility] Physical Therapy [address] [place], [place], [address] Phone: [phone]   Fax:  [phone]  Physical Therapy Treatment  Patient Details  Name: [name] MRN: [id number] Date of Birth: [birth year] Referring Provider (PT): West Bali Persons, [place]   Encounter Date: 11/22/2019   PT End of Session - 11/22/19 1007    Visit Number 2    Number of Visits 17    Authorization Type BCBS comm    Authorization Time Period $35 co-pay    PT Start Time 1008    PT Stop Time 1055    PT Time Calculation (min) 47 min    Equipment Utilized During Treatment Gait belt    Activity Tolerance Patient tolerated treatment well    Behavior During Therapy WFL for tasks assessed/performed           Past Medical History:  Diagnosis Date  . Diabetic neuropathy ([name])   . Diverticulitis   . History of hepatitis C    has been treated in the past  . History of kidney stones   . Hyperlipidemia   . Hypertension   . 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[name])   . Other testicular hypofunction   . Severe sepsis ([name]) 08/26/2019  . Type II or unspecified type diabetes mellitus without mention of complication, not stated as uncontrolled   . Vitamin D deficiency     Past Surgical History:  Procedure Laterality Date  . AMPUTATION Right 09/01/2019   Procedure: RIGHT BELOW KNEE AMPUTATION;  Surgeon: Nadara Mustard, MD;  Location: [facility] OR;  Service: Orthopedics;  Laterality: Right;  . CHOLECYSTECTOMY  1987  . COLONOSCOPY    . I & D EXTREMITY Right 08/20/2019   Procedure: IRRIGATION & DEBRIDEMENT RIGHT FOOT PARTIAL CALCANEAL EXCISION;  Surgeon: Nadara Mustard, MD;  Location: [facility] OR;  Service: Orthopedics;  Laterality: Right;  . I & D EXTREMITY Right 08/27/2019   Procedure: IRRIGATION AND DEBRIDEMENT  OF FOOT;  Surgeon: Tarry Kos, MD;  Location: [facility] OR;  Service: Orthopedics;  Laterality: Right;  . ORIF TIBIA FRACTURE     x 3 right leg    There were no vitals filed for this  visit.   Subjective Assessment - 11/22/19 1008    Subjective He wore prosthesis 2hrs 2x/day with one area becoming sore.    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 No/denies                             [facility] Adult PT Treatment/Exercise - 11/22/19 1010      Transfers   Transfers Sit to Stand;Stand to Sit    Sit to Stand 5: Supervision;With upper extremity assist;With armrests;From chair/3-in-1;Other (comment)   requires RW support to stabilize   Stand to Sit 5: Supervision;With upper extremity assist;With armrests;To chair/3-in-1;Other (comment)   requires RW for stability     Ambulation/Gait   Ambulation/Gait Yes    Ambulation/Gait Assistance 5: Supervision;4: Min guard   min guard initially w/ cane progressed to supervision   Ambulation/Gait Assistance Details demo & verbal cues on sequence and technique with cane.     Ambulation Distance (Feet) 100 Feet   100'+ with RW & 40' w/cane  Assistive device Rolling walker;Prosthesis;Straight cane    Gait Pattern --    Ambulation Surface Level;Indoor    Gait velocity --      Posture/Postural Control   Posture/Postural Control --    Postural Limitations --      Knee/Hip Exercises: Aerobic   Nustep level 5 with BLEs & BUEs 5 min warm-up while PT did subjective      Prosthetics   Prosthetic Care Comments  increase wear to 3hrs 2x/day.  see pt instructions for adjusting ply socks (demo with too few, too many & correct ply),  sweating & increasing activity level     Current prosthetic wear tolerance (days/week)  daily    Current prosthetic wear tolerance (#hours/day)  2 hrs 2x/day    Current prosthetic weight-bearing tolerance (hours/day)  Patient tolerates standing 5 min with partial weight  on prosthesis without pain or limb discomfort    Edema pitting edema    Residual limb condition  no open areas, cylinderical shape, good hair growth, normal color & temperature, sweaty skin    Education Provided Skin check;Residual limb care;Correct ply sock adjustment;Proper Donning;Proper wear schedule/adjustment;Other (comment)   see prosthetic care comments   Person(s) Educated Patient;Spouse    Education Method Explanation;Demonstration;Tactile cues;Verbal cues;Handout    Education Method Verbalized understanding;Returned demonstration;Tactile cues required;Verbal cues required;Needs further instruction    Donning Prosthesis Supervision    Doffing Prosthesis Modified independent (device/increased time)                    PT Short Term Goals - 11/17/19 2121      PT SHORT TERM GOAL #1   Title Patient demonstrates understanding of donning prosthesis, cleaning & adjusting ply socks. (All STGs Target Date: 12/15/2019)    Time 4    Period Weeks    Status New    Target Date 12/15/19      PT SHORT TERM GOAL #2   Title Patient tolerates prosthesis wear >12hrs/ day without skin issues.    Time 4    Period Weeks    Status New    Target Date 12/15/19      PT SHORT TERM GOAL #3   Title Patient able to stand 60sec without UE support and pick up item from floor with supervision.    Time 4    Period Weeks    Status New    Target Date 12/15/19      PT SHORT TERM GOAL #4   Title Patient ambulates 250' with cane & prosthesis with supervision.    Time 4    Period Weeks    Status New    Target Date 12/15/19      PT SHORT TERM GOAL #5   Title Patient negotiates stairs with single rail, ramps & curbs with cane & prosthesis with minA.    Time 4    Period Weeks    Status New    Target Date 12/15/19             PT Long Term Goals - 11/17/19 2114      PT LONG TERM GOAL #1   Title Patient verbalizes & demonstrates understanding of prosthetic care comments to enable safe  utilization of prosthesis.  (All LTGs Target Date:  01/12/2020)    Time 8    Period Weeks    Status New    Target Date 01/12/20      PT LONG TERM GOAL #2   Title Patient tolerates prosthesis wear >90% of  awake hours without skin or residual limb pain issues.    Time 8    Period Weeks    Status New    Target Date 01/12/20      PT LONG TERM GOAL #3   Title Berg Balance >/=45/56    Time 8    Period Weeks    Status New    Target Date 01/12/20      PT LONG TERM GOAL #4   Title Patient ambulates >500' including grass with cane or less and prosthesis modified independent.    Time 8    Period Weeks    Status New    Target Date 01/12/20      PT LONG TERM GOAL #5   Title Patient negotiates ramps, curbs & stairs with cane or less and prosthesis modified independent.    Time 8    Period Weeks    Status New    Target Date 01/12/20      Additional Long Term Goals   Additional Long Term Goals Yes      PT LONG TERM GOAL #6   Title Patient able to lift & carry 20# box and push / pull activities engaging prosthesis with proper technique.    Time 8    Period Weeks    Status New    Target Date 01/12/20                 Plan - 11/22/19 1007    Clinical Impression Statement PT instructed pt in adjusting ply socks, signs of sweating in liner & increasing activity level. He appears to have a general understanding.    Personal Factors and Comorbidities Comorbidity 3+;Past/Current Experience    Comorbidities TTA, DM2, HTN, Hep C,HLD,    Examination-Activity Limitations Carry;Lift;Locomotion Level;Reach Overhead;Squat;Stairs;Stand;Transfers    Examination-Participation Restrictions Community Activity;Driving;Occupation    Stability/Clinical Decision Making Evolving/Moderate complexity    Rehab Potential Good    PT Frequency 2x / week    PT Duration 8 weeks    PT Treatment/Interventions ADLs/Self Care Home Management;DME Instruction;Gait training;Stair training;Functional mobility  training;Therapeutic activities;Therapeutic exercise;Balance training;Neuromuscular re-education;Patient/family education;Prosthetic Training;Scar mobilization;Vestibular    PT Next Visit Plan review prosthetic care, HEP at sink, prosthetic gait with RW including ramps & curbs    Consulted and Agree with Plan of Care Patient;Family member/caregiver    Family Member Consulted wife, Aydin Hink           Patient will benefit from skilled therapeutic intervention in order to improve the following deficits and impairments:  Abnormal gait, Decreased activity tolerance, Decreased balance, Decreased endurance, Decreased knowledge of use of DME, Decreased mobility, Decreased skin integrity, Decreased strength, Dizziness, Increased edema, Postural dysfunction, Prosthetic Dependency  Visit Diagnosis: Other abnormalities of gait and mobility  Unsteadiness on feet  Muscle weakness (generalized)  Abnormal posture     Problem List Patient Active Problem List   Diagnosis Date Noted  . Right below-knee amputee ([name]) 09/03/2019  . Hyponatremia 08/26/2019  . Cutaneous abscess of right foot   . Poorly controlled diabetes mellitus ([name]) 07/16/2019  . FHx: heart disease 10/08/2017  . Type 2 diabetes mellitus with stage 2 chronic kidney disease, without long-term current use of insulin ([name]) 10/08/2017  . Insomnia 07/08/2017  . Smoker 08/17/2015  . COPD (chronic obstructive pulmonary disease) ([name]) 08/17/2015  . 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 Medication management 05/19/2013  . Morbid obesity (BMI 35) 05/19/2013  .  Essential hypertension   . Hyperlipidemia associated with type 2 diabetes mellitus ([name])   . Vitamin D deficiency   . History of kidney stones   . Testosterone deficiency   . History of hepatitis C     Vladimir Fasterobin Sheila Gervasi, PT, DPT 11/22/2019, 4:18 PM  [facility] [address] [place], [place], [address] Phone: [phone]   Fax:  [phone]  Name: Michael CommonRobert C Arellano MRN: [id number] Date of Birth: December 22, 1960

## 2019-11-22 NOTE — Patient Instructions (Signed)
Hanger Socks: 1-ply is yellow color at top, 3-ply is green at top, 5-ply is navy blue at top How many ply you need depends on your limb size.  You should have even pressure on your limb when standing & walking.  Guidance points: 1. How ease it goes on? Should be some resistance. Too few it goes on too easily. Too many it takes a lot of work to get it on. 2. How many clicks you get. Especially clicks in sitting. 3. After standing or walking, check knee cap. Bottom should be just under the front lip.  Too few bottom of knee cap sits on indention. Too many bottom is above front lip. 4. Have your feet beside each other & hips over feet. Place hands on your waist. Pelvis Should be level. Too few prosthetic side will be low. Too many prosthetic side will be high.    Get ply socks correct before you leave the house. Take extra socks with you. Take one 3-ply and two 1-ply with you. This is in addition to what you are wearing.   Sweating increases with an amputation. Your body is trying to regulate your temperature & without an extremity, you sweat more easily to cool off. Also prosthetic material like liners do not breath and add hot layers which causes even more sweating. With time your body typically will accommodate to prosthesis and your sweat level will come closer to level with amputation but not pre-amputation level.   You need to pat your limb & liner dry when you notice sweating. If you leave sweat trapped inside your liner, then it can result in a blister.   Signs of sweating in your liner: 1. You are sweating elsewhere on your body or you notice sweat running / dripping.  2. Take note of how high your liner comes up on your limb when you first put your liner on your limb. If you notice that your liner has slipped down, then you probably have sweat inside your liner. A good time to check for liner slippage is when toileting.  3. You feel air bubbles inside your liner. When you liner slips, then air  is allowed in bottom. As you put weight on prosthesis, the air is burp or pushed out. 4. You feel something crawling or moving inside your liner. When sweat runs inside the closed system of liner, it often feels like a bug or something crawling inside your liner.  If any of above symptoms are noted, you need to remove your prosthesis & liner to pat your limb & liner dry. This is permanent need as leaving sweat or water trapped can result in a blister or wound.    Increasing your activity level is important. Short distances which is walking from one room to another. Work to increase frequency back to prior level. Medium distances are entering & exiting your home or community with limited distances. Start with 4 medium walks which is one outing to one location and increase number of tolerated amounts. Long distance is your highest tolerance for you. Walk until you feel you must rest. Back or leg pain or general fatigue are indicators to maximum tolerance. Monitor by distance or time. Try to walk your BEST distance 1-2 times per day. You should see this increase over time.    Driving options with Below Knee Prosthesis   Option 1:  Remove prosthesis and use left leg to crossover drive Option 2:  2 Foot driving - right foot on  gas & left foot on brake.  Prosthetic motion is leg press motion not ankle pump.  Focus is on heel pressing out not on toes/forefoot.   Option 3: Use prosthesis on both gas & brake.  Continue to focus on heel motion & position.  Option 4: Left foot accelerator.  Consider portable left foot accelerator ( http://plfa.org ) as allows to switch between cars or remove for other drivers.  Left foot both gas & brake.  Using cruise control to accelerate & decelerate or resume speed after stopping can safe some leg work / motion.   Practice initially in large empty parking lot. Stay in middle not near curbs. Practice turning & backing into parking spot on right & left. Parallel park.   Back up.  Quick stops. More about learning foot work not staying in motion.

## 2019-11-24 ENCOUNTER — Ambulatory Visit: Payer: BC Managed Care – PPO | Admitting: Physical Therapy

## 2019-11-24 ENCOUNTER — Other Ambulatory Visit: Payer: Self-pay

## 2019-11-24 ENCOUNTER — Encounter: Payer: Self-pay | Admitting: Physical Therapy

## 2019-11-24 DIAGNOSIS — R293 Abnormal posture: Secondary | ICD-10-CM | POA: Diagnosis not present

## 2019-11-24 DIAGNOSIS — M6281 Muscle weakness (generalized): Secondary | ICD-10-CM

## 2019-11-24 DIAGNOSIS — R2681 Unsteadiness on feet: Secondary | ICD-10-CM

## 2019-11-24 DIAGNOSIS — R2689 Other abnormalities of gait and mobility: Secondary | ICD-10-CM | POA: Diagnosis not present

## 2019-11-24 NOTE — Therapy (Signed)
[facility] [address] [place], [place], [id number] Phone: [phone]   Fax:  [phone]  Physical Therapy Treatment  Patient Details  Name: Michael CommonRobert C Wiberg MRN: [id number] Date of Birth: [birth year] Referring Provider (PT): [place] Anne Persons, [place]   Encounter Date: 11/24/2019   PT End of Session - 11/24/19 1110    Visit Number 3    Number of Visits 17    Authorization Type BC[employer] comm    Authorization Time Period $35 co-pay    PT Start Time 1100    PT Stop Time 1145    PT Time Calculation (min) 45 min    Equipment Utilized During Treatment Gait belt    Activity Tolerance Patient tolerated treatment well    Behavior During Therapy WFL for tasks assessed/performed           Past Medical History:  Diagnosis Date   Diabetic neuropathy ([name])    Diverticulitis    History of hepatitis C    has been treated in the past   History of kidney stones    Hyperlipidemia    Hypertension    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[name])    Other testicular hypofunction    Severe sepsis ([name]) 08/26/2019   Type II or unspecified type diabetes mellitus without mention of complication, not stated as uncontrolled    Vitamin D deficiency     Past Surgical History:  Procedure Laterality Date   AMPUTATION Right 09/01/2019   Procedure: RIGHT BELOW KNEE AMPUTATION;  Surgeon: Nadara Mustarduda, Marcus V, MD;  [facility];  Service: Orthopedics;  Laterality: Right;   CHOLECYSTECTOMY  1987   COLONOSCOPY     I & D EXTREMITY Right 08/20/2019   Procedure: IRRIGATION & DEBRIDEMENT RIGHT FOOT PARTIAL CALCANEAL EXCISION;  Surgeon: Nadara Mustarduda, Marcus V, MD;  Location: [facility] OR;  Service: Orthopedics;  Laterality: Right;   I & D EXTREMITY Right 08/27/2019   Procedure: IRRIGATION AND DEBRIDEMENT  OF FOOT;  Surgeon: Tarry KosXu, Naiping M, MD;  Location: [facility] OR;  Service: Orthopedics;  Laterality: Right;   ORIF TIBIA FRACTURE     x 3 right leg    There were no vitals filed for this  visit.   Subjective Assessment - 11/24/19 1100    Subjective He wore prosthesis 6 hrs straight yesterday.  He walked more in [employer] and ran out of gas.    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 No/denies                             [facility] Adult PT Treatment/Exercise - 11/24/19 1100      Transfers   Transfers Sit to Stand;Stand to Sit    Sit to Stand 5: Supervision;With upper extremity assist;With armrests;From chair/3-in-1;Other (comment)   requires RW support to stabilize   Stand to Sit 5: Supervision;With upper extremity assist;With armrests;To chair/3-in-1;Other (comment)   requires RW for stability     Ambulation/Gait   Ambulation/Gait Yes    Ambulation/Gait Assistance 5: Supervision;4: Min assist   sup/verbal cues RW & minA cane    Ambulation/Gait Assistance Details verbal & tactile cues on sequence, upright posture & wt shift over prosthesis in stance     Ambulation  Distance (Feet) 100 Feet   100'+ with RW & 100' X 2 w/cane   Assistive device Rolling walker;Prosthesis;Straight cane    Ambulation Surface Level;Indoor    Stairs Yes    Stairs Assistance 4: Min guard    Stairs Assistance Details (indicate cue type and reason) PT demo & verbal cues on technique with TTA prosthesis    Stair Management Technique One rail Right;One rail Left;With cane;Step to pattern;Forwards    Number of Stairs 6   3 w/rt rail & 3 w/lt rail   Height of Stairs 6    Ramp 5: Supervision   RW & TTA prosthesis   Ramp Details (indicate cue type and reason) demo & verbal cues on technique    Curb 5: Supervision   RW & TTA prosthesis    Curb Details (indicate cue type and reason) verbal cues on technique      Therapeutic Activites    Therapeutic Activities  Lifting    Lifting PT demo & verbal cues on technique to pick up item from floor with TTA prosthesis. Pt return demo 2 reps with min guard.       Knee/Hip Exercises: Stretches   Passive Hamstring Stretch Right;Left;1 rep;20 seconds    Passive Hamstring Stretch Limitations LE extended on leg press plate with strap to DF foot      Knee/Hip Exercises: Aerobic   Nustep level 5 with BLEs & BUEs 5 min warm-up while PT did subjective      Knee/Hip Exercises: Machines for Strengthening   Cybex Leg Press shuttle leg press BLEs 15 reps 2 sets.       Prosthetics   Prosthetic Care Comments  increase wear to 4hrs 2-3x/day checking sweat at midpoint.  reviewed signs of sweating inside prosthetic liner.  PT verbal cues on donning long pants with TTA prosthesis.      Current prosthetic wear tolerance (days/week)  daily    Current prosthetic wear tolerance (#hours/day)  3 hrs 2x/day except 1 wear 6hrs    Current prosthetic weight-bearing tolerance (hours/day)  Patient tolerates standing 5 min with partial weight on prosthesis without pain or limb discomfort    Edema pitting edema    Residual limb condition  no open areas, cylinderical shape, good hair growth, normal color & temperature, sweaty skin    Education Provided Skin check;Residual limb care;Correct ply sock adjustment;Proper Donning;Proper wear schedule/adjustment;Other (comment)   see prosthetic care comments   Person(s) Educated Patient;Spouse    Education Method Explanation;Demonstration;Tactile cues;Verbal cues    Education Method Verbalized understanding;Needs further instruction    Donning Prosthesis Supervision    Doffing Prosthesis Modified independent (device/increased time)                    PT Short Term Goals - 11/17/19 2121      PT SHORT TERM GOAL #1   Title Patient demonstrates understanding of donning prosthesis, cleaning & adjusting ply socks. (All STGs Target Date: 12/15/2019)    Time 4    Period Weeks    Status  New    Target Date 12/15/19      PT SHORT TERM GOAL #2   Title Patient tolerates prosthesis wear >12hrs/ day without skin issues.    Time 4    Period Weeks    Status New    Target Date 12/15/19      PT SHORT TERM GOAL #3   Title Patient able to stand 60sec without UE support and pick up item from floor  with supervision.    Time 4    Period Weeks    Status New    Target Date 12/15/19      PT SHORT TERM GOAL #4   Title Patient ambulates 250' with cane & prosthesis with supervision.    Time 4    Period Weeks    Status New    Target Date 12/15/19      PT SHORT TERM GOAL #5   Title Patient negotiates stairs with single rail, ramps & curbs with cane & prosthesis with minA.    Time 4    Period Weeks    Status New    Target Date 12/15/19             PT Long Term Goals - 11/17/19 2114      PT LONG TERM GOAL #1   Title Patient verbalizes & demonstrates understanding of prosthetic care comments to enable safe utilization of prosthesis.  (All LTGs Target Date:  01/12/2020)    Time 8    Period Weeks    Status New    Target Date 01/12/20      PT LONG TERM GOAL #2   Title Patient tolerates prosthesis wear >90% of awake hours without skin or residual limb pain issues.    Time 8    Period Weeks    Status New    Target Date 01/12/20      PT LONG TERM GOAL #3   Title Berg Balance >/=45/56    Time 8    Period Weeks    Status New    Target Date 01/12/20      PT LONG TERM GOAL #4   Title Patient ambulates >500' including grass with cane or less and prosthesis modified independent.    Time 8    Period Weeks    Status New    Target Date 01/12/20      PT LONG TERM GOAL #5   Title Patient negotiates ramps, curbs & stairs with cane or less and prosthesis modified independent.    Time 8    Period Weeks    Status New    Target Date 01/12/20      Additional Long Term Goals   Additional Long Term Goals Yes      PT LONG TERM GOAL #6   Title Patient able to lift & carry  20# box and push / pull activities engaging prosthesis with proper technique.    Time 8    Period Weeks    Status New    Target Date 01/12/20                 Plan - 11/24/19 1110    Clinical Impression Statement PT instructed pt in negotiating ramps, curbs & stairs with TTA prosthesis and he appears to have a basic understanding.  PT also added leg press & hamstring stretch to in clinic program.  Patient is progressing well with prosthesis wear & use.    Personal Factors and Comorbidities Comorbidity 3+;Past/Current Experience    Comorbidities TTA, DM2, HTN, Hep C,HLD,    Examination-Activity Limitations Carry;Lift;Locomotion Level;Reach Overhead;Squat;Stairs;Stand;Transfers    Examination-Participation Restrictions Community Activity;Driving;Occupation    Stability/Clinical Decision Making Evolving/Moderate complexity    Rehab Potential Good    PT Frequency 2x / week    PT Duration 8 weeks    PT Treatment/Interventions ADLs/Self Care Home Management;DME Instruction;Gait training;Stair training;Functional mobility training;Therapeutic activities;Therapeutic exercise;Balance training;Neuromuscular re-education;Patient/family education;Prosthetic Training;Scar mobilization;Vestibular    PT Next Visit Plan  review prosthetic care, HEP at sink, prosthetic gait with RW including ramps & curbs, short distance gait with cane.    Consulted and Agree with Plan of Care Patient;Family member/caregiver    Family Member Consulted wife, Viral Schramm           Patient will benefit from skilled therapeutic intervention in order to improve the following deficits and impairments:  Abnormal gait, Decreased activity tolerance, Decreased balance, Decreased endurance, Decreased knowledge of use of DME, Decreased mobility, Decreased skin integrity, Decreased strength, Dizziness, Increased edema, Postural dysfunction, Prosthetic Dependency  Visit Diagnosis: Unsteadiness on feet  Other abnormalities of  gait and mobility  Muscle weakness (generalized)  Abnormal posture     Problem List Patient Active Problem List   Diagnosis Date Noted   Right below-knee amputee ([name]) 09/03/2019   Hyponatremia 08/26/2019   Cutaneous abscess of right foot    Poorly controlled diabetes mellitus ([name]) 07/16/2019   FHx: heart disease 10/08/2017   Type 2 diabetes mellitus with stage 2 chronic kidney disease, without long-term current use of insulin ([name]) 10/08/2017   Insomnia 07/08/2017   Smoker 08/17/2015   COPD (chronic obstructive pulmonary disease) ([name]) 08/17/2015   T2_NIDDM w/Peripheral Neuropathy 04/01/2014   CKD stage 2 due to type 2 diabetes mellitus ([name]) 08/31/2013   Medication management 05/19/2013   Morbid obesity (BMI 35) 05/19/2013   Essential hypertension    Hyperlipidemia associated with type 2 diabetes mellitus ([name])    Vitamin D deficiency    History of kidney stones    Testosterone deficiency    History of hepatitis C     Vladimir Faster, PT, DPT 11/24/2019, 11:57 AM  [facility] Physical Therapy [address] [place], [place], [address] Phone: [phone]   Fax:  [phone]  Name: MARLEE TRENTMAN MRN: [id number] Date of Birth: Feb 08, 1960

## 2019-11-29 ENCOUNTER — Ambulatory Visit: Payer: BC Managed Care – PPO | Admitting: Physical Therapy

## 2019-11-29 ENCOUNTER — Other Ambulatory Visit: Payer: Self-pay

## 2019-11-29 ENCOUNTER — Encounter: Payer: Self-pay | Admitting: Physical Therapy

## 2019-11-29 DIAGNOSIS — R2681 Unsteadiness on feet: Secondary | ICD-10-CM

## 2019-11-29 DIAGNOSIS — R2689 Other abnormalities of gait and mobility: Secondary | ICD-10-CM | POA: Diagnosis not present

## 2019-11-29 DIAGNOSIS — M6281 Muscle weakness (generalized): Secondary | ICD-10-CM

## 2019-11-29 DIAGNOSIS — R293 Abnormal posture: Secondary | ICD-10-CM | POA: Diagnosis not present

## 2019-11-29 NOTE — Therapy (Signed)
[facility] [address] [place], [place], [address] Phone: [phone]   Fax:  [phone]  Physical Therapy Treatment  Patient Details  Name: [name] MRN: [id number] Date of Birth: August 26, 1960 Referring Provider (PT): West BaliMary Anne Arellano, [place]   Encounter Date: 11/29/2019   PT End of Session - 11/29/19 1013    Visit Number 4    Number of Visits 17    Authorization Type BCBS comm    Authorization Time Period $35 co-pay    PT Start Time 1015    PT Stop Time 1055    PT Time Calculation (min) 40 min    Equipment Utilized During Treatment Gait belt    Activity Tolerance Patient tolerated treatment well    Behavior During Therapy WFL for tasks assessed/performed           Past Medical History:  Diagnosis Date   Diabetic neuropathy ([name])    Diverticulitis    History of hepatitis C    has been treated in the past   History of kidney stones    Hyperlipidemia    Hypertension    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[name])    Other testicular hypofunction    Severe sepsis ([name]) 08/26/2019   Type II or unspecified type diabetes mellitus without mention of complication, not stated as uncontrolled    Vitamin D deficiency     Past Surgical History:  Procedure Laterality Date   AMPUTATION Right 09/01/2019   Procedure: RIGHT BELOW KNEE AMPUTATION;  Surgeon: Nadara Mustarduda, Marcus V, MD;  [facility];  Service: Orthopedics;  Laterality: Right;   CHOLECYSTECTOMY  1987   COLONOSCOPY     I & D EXTREMITY Right 08/20/2019   Procedure: IRRIGATION & DEBRIDEMENT RIGHT FOOT PARTIAL CALCANEAL EXCISION;  Surgeon: Nadara Mustarduda, Marcus V, MD;  Location: [facility] OR;  Service: Orthopedics;  Laterality: Right;   I & D EXTREMITY Right 08/27/2019   Procedure: IRRIGATION AND DEBRIDEMENT  OF FOOT;  Surgeon: Tarry KosXu, Naiping M, MD;  Location: [facility] OR;  Service: Orthopedics;  Laterality: Right;   ORIF TIBIA FRACTURE     x 3 right leg    There were no vitals filed for this  visit.   Subjective Assessment - 11/29/19 1013    Subjective He wore prosthesis 8-10 hours total between 2 wears without issues.    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 No/denies                             [facility] Adult PT Treatment/Exercise - 11/29/19 1014      Transfers   Transfers Sit to Stand;Stand to Sit;Floor to Transfer    Sit to Stand 5: Supervision;With upper extremity assist;With armrests;From chair/3-in-1;Other (comment)   requires RW support to stabilize   Stand to Sit 5: Supervision;With upper extremity assist;With armrests;To chair/3-in-1;Other (comment)   requires RW for stability   Floor to Transfer 5: Supervision;With upper extremity assist   pushing off mat table   Floor to Transfer Details (indicate cue type and reason) PT demo & verbal cues on technique with TTA prosthesis. Pt return demo understanding with verbal cues.  PT recommended  2 reps 2x/day  to build LE strength &  technique      Ambulation/Gait   Ambulation/Gait Yes    Ambulation/Gait Assistance 5: Supervision;4: Min assist   sup/verbal cues RW & minA cane    Ambulation Distance (Feet) 100 Feet   100'+ with RW & 100' X 2 w/cane   Assistive device Rolling walker;Prosthesis;Straight cane    Stairs Yes    Stairs Assistance 4: Min guard    Stairs Assistance Details (indicate cue type and reason) PT demo & instructed in RLE controlled eccentric step to pattern and ascending alt pattern    Stair Management Technique With cane;Two rails;Step to pattern;Alternating pattern;Forwards   descend step to RLE eccentric & ascend alternating   Number of Stairs 11    Height of Stairs 6    Ramp 4: Min assist   cane & TTA prosthesis   Ramp Details (indicate cue type and  reason) tactile & verbal cues on technique    Curb 4: Min assist   cane & TTA prosthesis    Curb Details (indicate cue type and reason) tactile & verbal cues on technique      High Level Balance   High Level Balance Activities Side stepping;Backward walking;Other (comment)   walking with eyes closed to simulate walking in dark.    High Level Balance Comments tactile & verbal cues on technique      Therapeutic Activites    Therapeutic Activities --    Lifting --      Neuro Re-ed    Neuro Re-ed Details  Standing UE resistive with green theraband alt UE & BUEs 10 reps ea rows & forward punch.       Knee/Hip Exercises: Stretches   Passive Hamstring Stretch Right;Left;20 seconds;2 reps    Passive Hamstring Stretch Limitations LE extended on leg press plate with strap to DF foot      Knee/Hip Exercises: Aerobic   Nustep level 5 with BLEs & BUEs 5 min warm-up while PT did subjective      Knee/Hip Exercises: Machines for Strengthening   Cybex Leg Press 125# shuttle leg press BLEs 15 reps 2 sets Knee stabilization in ext lifting LLE off plate for 3 seconds;  RLE only 56# 15 reps 2 sets      Prosthetics   Prosthetic Care Comments  increase wear to donne arising, remove midday for 1-2 hours, then doffe 2nd wear at bedtime.  Dry limb/liner prn with signs of sweating or mid wear both times /day.     Current prosthetic wear tolerance (days/week)  daily    Current prosthetic wear tolerance (#hours/day)  4-5 hours 2x/day    Current prosthetic weight-bearing tolerance (hours/day)  Patient tolerates standing 10 min with partial weight on prosthesis without pain or limb discomfort    Edema pitting edema    Residual limb condition  no open areas, cylinderical shape, good hair growth, normal color & temperature, sweaty skin    Education Provided Skin check;Residual limb care;Correct ply sock adjustment;Proper Donning;Proper wear schedule/adjustment;Other (comment)   see prosthetic care comments    Person(s) Educated Patient    Education Method Explanation;Demonstration;Tactile cues;Verbal cues    Education Method Verbalized understanding;Tactile cues required;Verbal cues required;Needs further instruction    Donning Prosthesis Modified independent (device/increased time)    Doffing Prosthesis Modified independent (device/increased time)                    PT Short Term Goals - 11/17/19 2121      PT SHORT TERM GOAL #1   Title  Patient demonstrates understanding of donning prosthesis, cleaning & adjusting ply socks. (All STGs Target Date: 12/15/2019)    Time 4    Period Weeks    Status New    Target Date 12/15/19      PT SHORT TERM GOAL #2   Title Patient tolerates prosthesis wear >12hrs/ day without skin issues.    Time 4    Period Weeks    Status New    Target Date 12/15/19      PT SHORT TERM GOAL #3   Title Patient able to stand 60sec without UE support and pick up item from floor with supervision.    Time 4    Period Weeks    Status New    Target Date 12/15/19      PT SHORT TERM GOAL #4   Title Patient ambulates 250' with cane & prosthesis with supervision.    Time 4    Period Weeks    Status New    Target Date 12/15/19      PT SHORT TERM GOAL #5   Title Patient negotiates stairs with single rail, ramps & curbs with cane & prosthesis with minA.    Time 4    Period Weeks    Status New    Target Date 12/15/19             PT Long Term Goals - 11/17/19 2114      PT LONG TERM GOAL #1   Title Patient verbalizes & demonstrates understanding of prosthetic care comments to enable safe utilization of prosthesis.  (All LTGs Target Date:  01/12/2020)    Time 8    Period Weeks    Status New    Target Date 01/12/20      PT LONG TERM GOAL #2   Title Patient tolerates prosthesis wear >90% of awake hours without skin or residual limb pain issues.    Time 8    Period Weeks    Status New    Target Date 01/12/20      PT LONG TERM GOAL #3   Title Berg  Balance >/=45/56    Time 8    Period Weeks    Status New    Target Date 01/12/20      PT LONG TERM GOAL #4   Title Patient ambulates >500' including grass with cane or less and prosthesis modified independent.    Time 8    Period Weeks    Status New    Target Date 01/12/20      PT LONG TERM GOAL #5   Title Patient negotiates ramps, curbs & stairs with cane or less and prosthesis modified independent.    Time 8    Period Weeks    Status New    Target Date 01/12/20      Additional Long Term Goals   Additional Long Term Goals Yes      PT LONG TERM GOAL #6   Title Patient able to lift & carry 20# box and push / pull activities engaging prosthesis with proper technique.    Time 8    Period Weeks    Status New    Target Date 01/12/20                 Plan - 11/29/19 1013    Clinical Impression Statement PT progressed prosthetic activities to include floor transfers, standing resistive UE & movement different directions.  PT also progressed ramps & curbs with cane and stairs 2  rails advanced patterns.    Personal Factors and Comorbidities Comorbidity 3+;Past/Current Experience    Comorbidities TTA, DM2, HTN, Hep C,HLD,    Examination-Activity Limitations Carry;Lift;Locomotion Level;Reach Overhead;Squat;Stairs;Stand;Transfers    Examination-Participation Restrictions Community Activity;Driving;Occupation    Stability/Clinical Decision Making Evolving/Moderate complexity    Rehab Potential Good    PT Frequency 2x / week    PT Duration 8 weeks    PT Treatment/Interventions ADLs/Self Care Home Management;DME Instruction;Gait training;Stair training;Functional mobility training;Therapeutic activities;Therapeutic exercise;Balance training;Neuromuscular re-education;Patient/family education;Prosthetic Training;Scar mobilization;Vestibular    PT Next Visit Plan review prosthetic care, prosthetic gait including ramps & curbs with cane. therapeutic exercise    Consulted and Agree  with Plan of Care Patient    Family Member Consulted --           Patient will benefit from skilled therapeutic intervention in order to improve the following deficits and impairments:  Abnormal gait, Decreased activity tolerance, Decreased balance, Decreased endurance, Decreased knowledge of use of DME, Decreased mobility, Decreased skin integrity, Decreased strength, Dizziness, Increased edema, Postural dysfunction, Prosthetic Dependency  Visit Diagnosis: Unsteadiness on feet  Other abnormalities of gait and mobility  Muscle weakness (generalized)  Abnormal posture     Problem List Patient Active Problem List   Diagnosis Date Noted   Right below-knee amputee ([name]) 09/03/2019   Hyponatremia 08/26/2019   Cutaneous abscess of right foot    Poorly controlled diabetes mellitus ([name]) 07/16/2019   FHx: heart disease 10/08/2017   Type 2 diabetes mellitus with stage 2 chronic kidney disease, without long-term current use of insulin ([name]) 10/08/2017   Insomnia 07/08/2017   Smoker 08/17/2015   COPD (chronic obstructive pulmonary disease) ([name]) 08/17/2015   T2_NIDDM w/Peripheral Neuropathy 04/01/2014   CKD stage 2 due to type 2 diabetes mellitus ([name]) 08/31/2013   Medication management 05/19/2013   Morbid obesity (BMI 35) 05/19/2013   Essential hypertension    Hyperlipidemia associated with type 2 diabetes mellitus ([name])    Vitamin D deficiency    History of kidney stones    Testosterone deficiency    History of hepatitis C     Vladimir Faster PT, DPT 11/29/2019, 11:01 AM  [facility] Physical Therapy [address] [place], [place], [address] Phone: [phone]   Fax:  [phone]  Name: [name] MRN: [id number] Date of Birth: 1960-05-21

## 2019-12-01 ENCOUNTER — Encounter: Payer: Self-pay | Admitting: Physical Therapy

## 2019-12-01 ENCOUNTER — Ambulatory Visit: Payer: BC Managed Care – PPO | Admitting: Physical Therapy

## 2019-12-01 ENCOUNTER — Other Ambulatory Visit: Payer: Self-pay

## 2019-12-01 DIAGNOSIS — M6281 Muscle weakness (generalized): Secondary | ICD-10-CM | POA: Diagnosis not present

## 2019-12-01 DIAGNOSIS — R2689 Other abnormalities of gait and mobility: Secondary | ICD-10-CM

## 2019-12-01 DIAGNOSIS — R2681 Unsteadiness on feet: Secondary | ICD-10-CM

## 2019-12-01 DIAGNOSIS — R293 Abnormal posture: Secondary | ICD-10-CM | POA: Diagnosis not present

## 2019-12-01 NOTE — Therapy (Signed)
[facility] Physical Therapy [address] [place], [place], [address] Phone: [phone]   Fax:  [phone]  Physical Therapy Treatment  Patient Details  Name: [name] MRN: [id number] Date of Birth: Mar 03, 1960 Referring Provider (PT): [place] B[place] Persons, [place]   Encounter Date: 12/01/2019   PT End of Session - 12/01/19 1012    Visit Number 5    Number of Visits 17    Authorization Type BCBS comm    Authorization Time Period $35 co-pay    PT Start Time 1012    PT Stop Time 1100    PT Time Calculation (min) 48 min    Equipment Utilized During Treatment Gait belt    Activity Tolerance Patient tolerated treatment well    Behavior During Therapy WFL for tasks assessed/performed           Past Medical History:  Diagnosis Date  . Diabetic neuropathy ([name])   . Diverticulitis   . History of hepatitis C    has been treated in the past  . History of kidney stones   . Hyperlipidemia   . Hypertension   . 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[name])   . Other testicular hypofunction   . Severe sepsis ([name]) 08/26/2019  . Type II or unspecified type diabetes mellitus without mention of complication, not stated as uncontrolled   . Vitamin D deficiency     Past Surgical History:  Procedure Laterality Date  . AMPUTATION Right 09/01/2019   Procedure: RIGHT BELOW KNEE AMPUTATION;  Surgeon: Nadara Mustard, MD;  Location: [facility] OR;  Service: Orthopedics;  Laterality: Right;  . CHOLECYSTECTOMY  1987  . COLONOSCOPY    . I & D EXTREMITY Right 08/20/2019   Procedure: IRRIGATION & DEBRIDEMENT RIGHT FOOT PARTIAL CALCANEAL EXCISION;  Surgeon: Nadara Mustard, MD;  Location: [facility] OR;  Service: Orthopedics;  Laterality: Right;  . I & D EXTREMITY Right 08/27/2019   Procedure: IRRIGATION AND DEBRIDEMENT  OF FOOT;  Surgeon: Tarry Kos, MD;  Location: [facility] OR;  Service: Orthopedics;  Laterality: Right;  . ORIF TIBIA FRACTURE     x 3 right leg    There were no vitals filed for this  visit.   Subjective Assessment - 12/01/19 1013    Subjective He saw prosthetist this morning who made adjustments which help.    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 No/denies                             [facility] Adult PT Treatment/Exercise - 12/01/19 1012      Transfers   Transfers Sit to Stand;Stand to Sit;Floor to Transfer    Sit to Stand 5: Supervision;With upper extremity assist;With armrests;From chair/3-in-1;Other (comment)   requires RW support to stabilize   Stand to Sit 5: Supervision;With upper extremity assist;With armrests;To chair/3-in-1;Other (comment)   requires RW for stability   Floor to Transfer 5: Supervision;With upper extremity assist   pushing off mat table     Ambulation/Gait   Ambulation/Gait Yes    Ambulation/Gait Assistance 5: Supervision    Ambulation Distance (Feet) 100 Feet   100'+ with RW & 100' X 2 w/cane   Assistive device Prosthesis;Straight cane;None  entered w/cane, ambulated in clinic no device   Stairs --    Stairs Assistance --    Stair Management Technique --    Number of Stairs --    Height of Stairs --    Ramp 5: Supervision   cane & prosthesis   Curb 5: Supervision   cane & prosthesis     High Level Balance   High Level Balance Activities Side stepping;Backward walking;Other (comment)   walking with eyes closed to simulate walking in dark.    High Level Balance Comments tactile & verbal cues on technique      Neuro Re-ed    Neuro Re-ed Details  Standing UE resistive with green theraband alt UE & BUEs 10 reps ea rows & forward punch.       Knee/Hip Exercises: Stretches   Passive Hamstring Stretch Right;Left;20 seconds;2 reps    Passive Hamstring Stretch Limitations LE extended on leg  press plate with strap to DF foot      Knee/Hip Exercises: Aerobic   Nustep level 7 with BLEs & BUEs 5 min warm-up while PT did subjective      Knee/Hip Exercises: Machines for Strengthening   Cybex Leg Press 125# shuttle leg press BLEs 15 reps 2 sets Knee stabilization in ext lifting LLE off plate for 3 seconds;  RLE only 56# 15 reps 2 sets      Prosthetics   Prosthetic Care Comments  reviewed signs of sweating w/ need to dry limb/liner    Current prosthetic wear tolerance (days/week)  daily    Current prosthetic wear tolerance (#hours/day)  4-5 hours 2x/day    Current prosthetic weight-bearing tolerance (hours/day)  Patient tolerates standing 10 min with partial weight on prosthesis without pain or limb discomfort    Edema pitting edema    Residual limb condition  no open areas, cylinderical shape, good hair growth, normal color & temperature, sweaty skin    Education Provided Skin check;Residual limb care;Correct ply sock adjustment;Proper Donning;Proper wear schedule/adjustment;Other (comment)   see prosthetic care comments   Person(s) Educated Patient    Education Method Explanation;Verbal cues    Education Method Verbalized understanding    Donning Prosthesis Modified independent (device/increased time)    Doffing Prosthesis Modified independent (device/increased time)               Balance Exercises - 12/01/19 1012      Balance Exercises: Standing   Standing Eyes Opened Wide (BOA);Foam/compliant surface;Head turns;5 reps   head turns right/left & up/down   Standing Eyes Opened Limitations tactile, visual & verbal cues for ankle/residual limb & hip strategies    Standing Eyes Closed Wide (BOA);Foam/compliant surface;2 reps;30 secs    Standing Eyes Closed Limitations tactile / manual cues for balance reactions    Tandem Stance Eyes open;Foam/compliant surface;Intermittent upper extremity support;2 reps   60 sec prosthesis forward & prostheis back   Stepping Strategy  Anterior;Posterior;Foam/compliant surface;5 reps    Stepping Strategy Limitations anticipatory stepping off foam beam & stabilizing without UE support. required intermittent touch decreasing with practice.     Rockerboard Anterior/posterior;Lateral;EO;5 reps;UE support    Rockerboard Limitations wt shifts w/hips & knees, stop motion & recover               PT Short Term Goals - 11/17/19 2121      PT SHORT TERM GOAL #1   Title Patient demonstrates understanding of donning prosthesis, cleaning & adjusting ply socks. (All STGs Target Date: 12/15/2019)    Time  4    Period Weeks    Status New    Target Date 12/15/19      PT SHORT TERM GOAL #2   Title Patient tolerates prosthesis wear >12hrs/ day without skin issues.    Time 4    Period Weeks    Status New    Target Date 12/15/19      PT SHORT TERM GOAL #3   Title Patient able to stand 60sec without UE support and pick up item from floor with supervision.    Time 4    Period Weeks    Status New    Target Date 12/15/19      PT SHORT TERM GOAL #4   Title Patient ambulates 250' with cane & prosthesis with supervision.    Time 4    Period Weeks    Status New    Target Date 12/15/19      PT SHORT TERM GOAL #5   Title Patient negotiates stairs with single rail, ramps & curbs with cane & prosthesis with minA.    Time 4    Period Weeks    Status New    Target Date 12/15/19             PT Long Term Goals - 11/17/19 2114      PT LONG TERM GOAL #1   Title Patient verbalizes & demonstrates understanding of prosthetic care comments to enable safe utilization of prosthesis.  (All LTGs Target Date:  01/12/2020)    Time 8    Period Weeks    Status New    Target Date 01/12/20      PT LONG TERM GOAL #2   Title Patient tolerates prosthesis wear >90% of awake hours without skin or residual limb pain issues.    Time 8    Period Weeks    Status New    Target Date 01/12/20      PT LONG TERM GOAL #3   Title Berg Balance  >/=45/56    Time 8    Period Weeks    Status New    Target Date 01/12/20      PT LONG TERM GOAL #4   Title Patient ambulates >500' including grass with cane or less and prosthesis modified independent.    Time 8    Period Weeks    Status New    Target Date 01/12/20      PT LONG TERM GOAL #5   Title Patient negotiates ramps, curbs & stairs with cane or less and prosthesis modified independent.    Time 8    Period Weeks    Status New    Target Date 01/12/20      Additional Long Term Goals   Additional Long Term Goals Yes      PT LONG TERM GOAL #6   Title Patient able to lift & carry 20# box and push / pull activities engaging prosthesis with proper technique.    Time 8    Period Weeks    Status New    Target Date 01/12/20                 Plan - 12/01/19 1012    Clinical Impression Statement PT performed activities that facilitated ankle/residual limb, hip & anticipatory step strategy which he improved with instruction & practice.    Personal Factors and Comorbidities Comorbidity 3+;Past/Current Experience    Comorbidities TTA, DM2, HTN, Hep C,HLD,    Examination-Activity Limitations Carry;Lift;Locomotion Level;Reach Overhead;Squat;Stairs;Stand;Transfers  Examination-Participation Restrictions Community Activity;Driving;Occupation    Stability/Clinical Decision Making Evolving/Moderate complexity    Rehab Potential Good    PT Frequency 2x / week    PT Duration 8 weeks    PT Treatment/Interventions ADLs/Self Care Home Management;DME Instruction;Gait training;Stair training;Functional mobility training;Therapeutic activities;Therapeutic exercise;Balance training;Neuromuscular re-education;Patient/family education;Prosthetic Training;Scar mobilization;Vestibular    PT Next Visit Plan review prosthetic care, prosthetic gait including ramps & curbs with cane. therapeutic exercise    Consulted and Agree with Plan of Care Patient           Patient will benefit from  skilled therapeutic intervention in order to improve the following deficits and impairments:  Abnormal gait, Decreased activity tolerance, Decreased balance, Decreased endurance, Decreased knowledge of use of DME, Decreased mobility, Decreased skin integrity, Decreased strength, Dizziness, Increased edema, Postural dysfunction, Prosthetic Dependency  Visit Diagnosis: Unsteadiness on feet  Other abnormalities of gait and mobility  Muscle weakness (generalized)  Abnormal posture     Problem List Patient Active Problem List   Diagnosis Date Noted  . Right below-knee amputee ([name]) 09/03/2019  . Hyponatremia 08/26/2019  . Cutaneous abscess of right foot   . Poorly controlled diabetes mellitus ([name]) 07/16/2019  . FHx: heart disease 10/08/2017  . Type 2 diabetes mellitus with stage 2 chronic kidney disease, without long-term current use of insulin ([name]) 10/08/2017  . Insomnia 07/08/2017  . Smoker 08/17/2015  . COPD (chronic obstructive pulmonary disease) ([name]) 08/17/2015  . 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 Medication management 05/19/2013  . Morbid obesity (BMI 35) 05/19/2013  . Essential hypertension   . Hyperlipidemia associated with type 2 diabetes mellitus ([name])   . Vitamin D deficiency   . History of kidney stones   . Testosterone deficiency   . History of hepatitis C     Vladimir Faster PT, DPT 12/01/2019, 3:13 PM  [facility] Physical Therapy [address] [place], [place], [address] Phone: [phone]   Fax:  [phone]  Name: [name] MRN: [id number] Date of Birth: [birth year]

## 2019-12-06 ENCOUNTER — Ambulatory Visit: Payer: BC Managed Care – PPO | Admitting: Physical Therapy

## 2019-12-06 ENCOUNTER — Other Ambulatory Visit: Payer: Self-pay

## 2019-12-06 ENCOUNTER — Encounter: Payer: Self-pay | Admitting: Physical Therapy

## 2019-12-06 DIAGNOSIS — M6281 Muscle weakness (generalized): Secondary | ICD-10-CM | POA: Diagnosis not present

## 2019-12-06 DIAGNOSIS — R2681 Unsteadiness on feet: Secondary | ICD-10-CM | POA: Diagnosis not present

## 2019-12-06 DIAGNOSIS — R2689 Other abnormalities of gait and mobility: Secondary | ICD-10-CM | POA: Diagnosis not present

## 2019-12-06 DIAGNOSIS — R293 Abnormal posture: Secondary | ICD-10-CM

## 2019-12-06 NOTE — Therapy (Signed)
[facility] [address] [place], [place], [address] Phone: [phone]   Fax:  [phone]  Physical Therapy Treatment  Patient Details  Name: [name] MRN: [id number] Date of Birth: 1960-05-02 Referring Provider (PT): [place] Persons, [place]   Encounter Date: 12/06/2019   PT End of Session - 12/06/19 1014    Visit Number 6    Number of Visits 17    Authorization Type BCBS comm    Authorization Time Period $35 co-pay    PT Start Time 1013    PT Stop Time 1053    PT Time Calculation (min) 40 min    Equipment Utilized During Treatment Gait belt    Activity Tolerance Patient tolerated treatment well    Behavior During Therapy WFL for tasks assessed/performed           Past Medical History:  Diagnosis Date  . Diabetic neuropathy ([name])   . Diverticulitis   . History of hepatitis C    has been treated in the past  . History of kidney stones   . Hyperlipidemia   . Hypertension   . 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[name])   . Other testicular hypofunction   . Severe sepsis ([name]) 08/26/2019  . Type II or unspecified type diabetes mellitus without mention of complication, not stated as uncontrolled   . Vitamin D deficiency     Past Surgical History:  Procedure Laterality Date  . AMPUTATION Right 09/01/2019   Procedure: RIGHT BELOW KNEE AMPUTATION;  Surgeon: Nadara Mustard, MD;  Location: [facility] OR;  Service: Orthopedics;  Laterality: Right;  . CHOLECYSTECTOMY  1987  . COLONOSCOPY    . I & D EXTREMITY Right 08/20/2019   Procedure: IRRIGATION & DEBRIDEMENT RIGHT FOOT PARTIAL CALCANEAL EXCISION;  Surgeon: Nadara Mustard, MD;  Location: [facility] OR;  Service: Orthopedics;  Laterality: Right;  . I & D EXTREMITY Right 08/27/2019   Procedure: IRRIGATION AND DEBRIDEMENT  OF FOOT;  Surgeon: Tarry Kos, MD;  Location: [facility] OR;  Service: Orthopedics;  Laterality: Right;  . ORIF TIBIA FRACTURE     x 3 right leg    There were no vitals filed for this  visit.   Subjective Assessment - 12/06/19 1013    Subjective He has been wearing prosthesis 7hrs, 1.5 hr break, then wears remainder of awake hours. He has to dry limb/liner every 2-3 hours still.    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 No/denies                             [facility] Adult PT Treatment/Exercise - 12/06/19 1013      Transfers   Transfers Sit to Stand;Stand to Sit;Floor to Transfer    Sit to Stand 5: Supervision;With upper extremity assist;With armrests;From chair/3-in-1;Other (comment)   requires RW support to stabilize   Stand to Sit 5: Supervision;With upper extremity assist;With armrests;To chair/3-in-1;Other (comment)   requires RW for stability   Floor to Transfer 5: Supervision;With upper extremity assist   pushing off mat table     Ambulation/Gait   Ambulation/Gait Yes    Ambulation/Gait Assistance 5: Supervision    Ambulation Distance (Feet) 100 Feet   100'+ with  RW & 100' X 2 w/cane   Assistive device Prosthesis;Straight cane;None   entered w/cane, ambulated in clinic no device   Ramp 4: Min assist   prosthesis only   Ramp Details (indicate cue type and reason) demo & verbal cues on technique    Curb 4: Min assist   prosthesis only   Curb Details (indicate cue type and reason) verbal cues on technique      High Level Balance   High Level Balance Activities Side stepping;Backward walking;Other (comment)   walking with eyes closed to simulate walking in dark.    High Level Balance Comments tactile & verbal cues on technique      Neuro Re-ed    Neuro Re-ed Details  Standing UE resistive with blue theraband alt UE & BUEs 10 reps ea rows & forward punch.       Knee/Hip Exercises: Stretches   Passive Hamstring  Stretch Right;Left;20 seconds;2 reps    Passive Hamstring Stretch Limitations LE extended on leg press plate with strap to DF foot      Knee/Hip Exercises: Aerobic   Nustep level 7 with BLEs & BUEs 5 min warm-up while PT did subjective      Knee/Hip Exercises: Machines for Strengthening   Cybex Leg Press 131# shuttle leg press BLEs 15 reps 2 sets Knee stabilization in ext lifting LLE off plate for 3 seconds;  RLE only 56# 15 reps 2 sets      Prosthetics   Prosthetic Care Comments  PT introduced Sweat Block weekly as option for controlling sweat.  PT also recommended having prothetist add pretibial pads to socket.     Current prosthetic wear tolerance (days/week)  daily    Current prosthetic wear tolerance (#hours/day)  4-5 hours 2x/day    Current prosthetic weight-bearing tolerance (hours/day)  Patient tolerates standing 10 min with partial weight on prosthesis without pain or limb discomfort    Edema pitting edema    Residual limb condition  no open areas, cylinderical shape, good hair growth, normal color & temperature, sweaty skin    Education Provided Skin check;Residual limb care;Correct ply sock adjustment;Other (comment)   see prosthetic care comments   Person(s) Educated Patient    Education Method Explanation;Verbal cues    Education Method Verbalized understanding;Needs further instruction;Verbal cues required    Donning Prosthesis Modified independent (device/increased time)    Doffing Prosthesis Modified independent (device/increased time)               Balance Exercises - 12/06/19 1013      Balance Exercises: Standing   Standing Eyes Opened Wide (BOA);Foam/compliant surface;Head turns;5 reps   head turns right/left & up/down   Standing Eyes Opened Limitations tactile, visual & verbal cues for ankle/residual limb & hip strategies    Standing Eyes Closed Wide (BOA);Foam/compliant surface;2 reps;30 secs    Standing Eyes Closed Limitations tactile / manual cues for  balance reactions    Tandem Stance Eyes open;Foam/compliant surface;Intermittent upper extremity support;2 reps   60 sec prosthesis forward & prostheis back   Stepping Strategy Anterior;Posterior;Foam/compliant surface;5 reps    Stepping Strategy Limitations anticipatory stepping off foam beam & stabilizing without UE support. required intermittent touch decreasing with practice.     Rockerboard Anterior/posterior;Lateral;EO;5 reps;UE support    Rockerboard Limitations wt shifts w/hips & knees, stop motion & recover               PT Short Term Goals - 11/17/19 2121      PT SHORT  TERM GOAL #1   Title Patient demonstrates understanding of donning prosthesis, cleaning & adjusting ply socks. (All STGs Target Date: 12/15/2019)    Time 4    Period Weeks    Status New    Target Date 12/15/19      PT SHORT TERM GOAL #2   Title Patient tolerates prosthesis wear >12hrs/ day without skin issues.    Time 4    Period Weeks    Status New    Target Date 12/15/19      PT SHORT TERM GOAL #3   Title Patient able to stand 60sec without UE support and pick up item from floor with supervision.    Time 4    Period Weeks    Status New    Target Date 12/15/19      PT SHORT TERM GOAL #4   Title Patient ambulates 250' with cane & prosthesis with supervision.    Time 4    Period Weeks    Status New    Target Date 12/15/19      PT SHORT TERM GOAL #5   Title Patient negotiates stairs with single rail, ramps & curbs with cane & prosthesis with minA.    Time 4    Period Weeks    Status New    Target Date 12/15/19             PT Long Term Goals - 11/17/19 2114      PT LONG TERM GOAL #1   Title Patient verbalizes & demonstrates understanding of prosthetic care comments to enable safe utilization of prosthesis.  (All LTGs Target Date:  01/12/2020)    Time 8    Period Weeks    Status New    Target Date 01/12/20      PT LONG TERM GOAL #2   Title Patient tolerates prosthesis wear >90% of  awake hours without skin or residual limb pain issues.    Time 8    Period Weeks    Status New    Target Date 01/12/20      PT LONG TERM GOAL #3   Title Berg Balance >/=45/56    Time 8    Period Weeks    Status New    Target Date 01/12/20      PT LONG TERM GOAL #4   Title Patient ambulates >500' including grass with cane or less and prosthesis modified independent.    Time 8    Period Weeks    Status New    Target Date 01/12/20      PT LONG TERM GOAL #5   Title Patient negotiates ramps, curbs & stairs with cane or less and prosthesis modified independent.    Time 8    Period Weeks    Status New    Target Date 01/12/20      Additional Long Term Goals   Additional Long Term Goals Yes      PT LONG TERM GOAL #6   Title Patient able to lift & carry 20# box and push / pull activities engaging prosthesis with proper technique.    Time 8    Period Weeks    Status New    Target Date 01/12/20                 Plan - 12/06/19 1015    Clinical Impression Statement PT working on prosthetic gait & balance with prosthesis only.  He is improving his balance reactions but are impaired still  with fall risk.    Personal Factors and Comorbidities Comorbidity 3+;Past/Current Experience    Comorbidities TTA, DM2, HTN, Hep C,HLD,    Examination-Activity Limitations Carry;Lift;Locomotion Level;Reach Overhead;Squat;Stairs;Stand;Transfers    Examination-Participation Restrictions Community Activity;Driving;Occupation    Stability/Clinical Decision Making Evolving/Moderate complexity    Rehab Potential Good    PT Frequency 2x / week    PT Duration 8 weeks    PT Treatment/Interventions ADLs/Self Care Home Management;DME Instruction;Gait training;Stair training;Functional mobility training;Therapeutic activities;Therapeutic exercise;Balance training;Neuromuscular re-education;Patient/family education;Prosthetic Training;Scar mobilization;Vestibular    PT Next Visit Plan review prosthetic  care, prosthetic gait including ramps & curbs with cane. therapeutic exercise    Consulted and Agree with Plan of Care Patient           Patient will benefit from skilled therapeutic intervention in order to improve the following deficits and impairments:  Abnormal gait, Decreased activity tolerance, Decreased balance, Decreased endurance, Decreased knowledge of use of DME, Decreased mobility, Decreased skin integrity, Decreased strength, Dizziness, Increased edema, Postural dysfunction, Prosthetic Dependency  Visit Diagnosis: Unsteadiness on feet  Other abnormalities of gait and mobility  Muscle weakness (generalized)  Abnormal posture     Problem List Patient Active Problem List   Diagnosis Date Noted  . Right below-knee amputee ([name]) 09/03/2019  . Hyponatremia 08/26/2019  . Cutaneous abscess of right foot   . Poorly controlled diabetes mellitus ([name]) 07/16/2019  . FHx: heart disease 10/08/2017  . Type 2 diabetes mellitus with stage 2 chronic kidney disease, without long-term current use of insulin ([name]) 10/08/2017  . Insomnia 07/08/2017  . Smoker 08/17/2015  . COPD (chronic obstructive pulmonary disease) ([name]) 08/17/2015  . 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 Medication management 05/19/2013  . Morbid obesity (BMI 35) 05/19/2013  . Essential hypertension   . Hyperlipidemia associated with type 2 diabetes mellitus ([name])   . Vitamin D deficiency   . History of kidney stones   . Testosterone deficiency   . History of hepatitis C     Vladimir Faster PT, DPT 12/06/2019, 10:56 AM  [facility] [address] [place], [place], [address] Phone: [phone]   Fax:  [phone]  Name: RIZWAN KUYPER MRN: [id number] Date of Birth: 10-23-1960

## 2019-12-08 ENCOUNTER — Ambulatory Visit: Payer: BC Managed Care – PPO | Admitting: Physical Therapy

## 2019-12-08 ENCOUNTER — Other Ambulatory Visit: Payer: Self-pay

## 2019-12-08 DIAGNOSIS — R293 Abnormal posture: Secondary | ICD-10-CM | POA: Diagnosis not present

## 2019-12-08 DIAGNOSIS — R2681 Unsteadiness on feet: Secondary | ICD-10-CM | POA: Diagnosis not present

## 2019-12-08 DIAGNOSIS — R2689 Other abnormalities of gait and mobility: Secondary | ICD-10-CM | POA: Diagnosis not present

## 2019-12-08 DIAGNOSIS — M6281 Muscle weakness (generalized): Secondary | ICD-10-CM | POA: Diagnosis not present

## 2019-12-08 NOTE — Therapy (Signed)
[facility] [address] [place], [place], [address] Phone: [phone]   Fax:  [phone]  Physical Therapy Treatment  Patient Details  Name: [name] MRN: [id number] Date of Birth: May 12, 1960 Referring Provider (PT): [place] Persons, [place]   Encounter Date: 12/08/2019   PT End of Session - 12/08/19 1018    Visit Number 7    Number of Visits 17    Authorization Type BCBS comm    Authorization Time Period $35 co-pay    PT Start Time 1015    PT Stop Time 1100    PT Time Calculation (min) 45 min    Equipment Utilized During Treatment Gait belt    Activity Tolerance Patient tolerated treatment well    Behavior During Therapy WFL for tasks assessed/performed           Past Medical History:  Diagnosis Date  . Diabetic neuropathy ([name])   . Diverticulitis   . History of hepatitis C    has been treated in the past  . History of kidney stones   . Hyperlipidemia   . Hypertension   . 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[name])   . Other testicular hypofunction   . Severe sepsis ([name]) 08/26/2019  . Type II or unspecified type diabetes mellitus without mention of complication, not stated as uncontrolled   . Vitamin D deficiency     Past Surgical History:  Procedure Laterality Date  . AMPUTATION Right 09/01/2019   Procedure: RIGHT BELOW KNEE AMPUTATION;  Surgeon: Nadara Mustard, MD;  Location: [facility] OR;  Service: Orthopedics;  Laterality: Right;  . CHOLECYSTECTOMY  1987  . COLONOSCOPY    . I & D EXTREMITY Right 08/20/2019   Procedure: IRRIGATION & DEBRIDEMENT RIGHT FOOT PARTIAL CALCANEAL EXCISION;  Surgeon: Nadara Mustard, MD;  Location: [facility] OR;  Service: Orthopedics;  Laterality: Right;  . I & D EXTREMITY Right 08/27/2019   Procedure: IRRIGATION AND DEBRIDEMENT  OF FOOT;  Surgeon: Tarry Kos, MD;  Location: [facility] OR;  Service: Orthopedics;  Laterality: Right;  . ORIF TIBIA FRACTURE     x 3 right leg    There were no vitals filed for this  visit.   Subjective Assessment - 12/08/19 1019    Subjective He is wearing up to 10 ply socks by end of day. He has appt with prosthetist tomorrow.    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 No/denies                             [facility] Adult PT Treatment/Exercise - 12/08/19 1015      Transfers   Transfers Sit to Stand;Stand to Sit;Floor to Transfer    Sit to Stand 5: Supervision;With upper extremity assist;With armrests;From chair/3-in-1;Other (comment)   requires RW support to stabilize   Stand to Sit 5: Supervision;With upper extremity assist;With armrests;To chair/3-in-1;Other (comment)   requires RW for stability   Floor to Transfer 5: Supervision;With upper extremity assist   pushing off mat table     Ambulation/Gait   Ambulation/Gait Yes    Ambulation/Gait Assistance 5: Supervision    Ambulation Distance (Feet) 100 Feet   100'+ with RW & 100' X 2 w/cane  Assistive device Prosthesis;Straight cane;None   entered w/cane, ambulated in clinic no device   Ramp 4: Min assist   prosthesis only   Curb 4: Min assist   prosthesis only     High Level Balance   High Level Balance Activities Side stepping;Backward walking;Other (comment)   walking with eyes closed to simulate walking in dark.    High Level Balance Comments tactile & verbal cues on technique      Therapeutic Activites    Therapeutic Activities Other Therapeutic Activities    Other Therapeutic Activities weight shift with prosthesis step forward offset feet for push / pull motion.  PT demo how this will enable fishing motion.       Neuro Re-ed    Neuro Re-ed Details  Standing UE resistive with blue theraband alt UE & BUEs 10 reps ea rows & forward punch.       Knee/Hip  Exercises: Stretches   Passive Hamstring Stretch Right;Left;20 seconds;2 reps    Passive Hamstring Stretch Limitations LE extended on leg press plate with strap to DF foot      Knee/Hip Exercises: Aerobic   Nustep level 7 with BLEs & BUEs 5 min warm-up while PT did subjective      Knee/Hip Exercises: Machines for Strengthening   Cybex Leg Press 131# shuttle leg press BLEs 15 reps 2 sets Knee stabilization in ext lifting LLE off plate for 3 seconds;  RLE only 56# 15 reps 2 sets      Prosthetics   Prosthetic Care Comments  Increasing wear to all awake hours. dry limb/liner prn or q4 hrs.      Current prosthetic wear tolerance (days/week)  daily    Current prosthetic wear tolerance (#hours/day)  4-5 hours 2x/day    Current prosthetic weight-bearing tolerance (hours/day)  Patient tolerates standing 10 min with partial weight on prosthesis without pain or limb discomfort    Edema pitting edema    Residual limb condition  no open areas, cylinderical shape, good hair growth, normal color & temperature, sweaty skin    Education Provided Correct ply sock adjustment;Other (comment);Proper wear schedule/adjustment   see prosthetic care comments   Person(s) Educated Patient    Education Method Explanation;Verbal cues    Education Method Verbalized understanding               Balance Exercises - 12/08/19 1015      Balance Exercises: Standing   Standing Eyes Opened Wide (BOA);Foam/compliant surface;Head turns;5 reps (P)    head turns right/left & up/down   Standing Eyes Opened Limitations tactile, visual & verbal cues for ankle/residual limb & hip strategies (P)     Standing Eyes Closed Wide (BOA);Foam/compliant surface;2 reps;30 secs (P)     Standing Eyes Closed Limitations tactile / manual cues for balance reactions (P)     Tandem Stance Eyes open;Foam/compliant surface;Intermittent upper extremity support;2 reps (P)    60 sec prosthesis forward & prostheis back   Stepping Strategy  Anterior;Posterior;Foam/compliant surface;5 reps (P)     Stepping Strategy Limitations anticipatory stepping off foam beam & stabilizing without UE support. required intermittent touch decreasing with practice.  (P)     Rockerboard Anterior/posterior;Lateral;EO;10 reps;Intermittent UE support (P)    ant/post with right foot forward   Rockerboard Limitations wt shifts w/hips & knees, stop motion & recover (P)                PT Short Term Goals - 11/17/19 2121      PT SHORT  TERM GOAL #1   Title Patient demonstrates understanding of donning prosthesis, cleaning & adjusting ply socks. (All STGs Target Date: 12/15/2019)    Time 4    Period Weeks    Status New    Target Date 12/15/19      PT SHORT TERM GOAL #2   Title Patient tolerates prosthesis wear >12hrs/ day without skin issues.    Time 4    Period Weeks    Status New    Target Date 12/15/19      PT SHORT TERM GOAL #3   Title Patient able to stand 60sec without UE support and pick up item from floor with supervision.    Time 4    Period Weeks    Status New    Target Date 12/15/19      PT SHORT TERM GOAL #4   Title Patient ambulates 250' with cane & prosthesis with supervision.    Time 4    Period Weeks    Status New    Target Date 12/15/19      PT SHORT TERM GOAL #5   Title Patient negotiates stairs with single rail, ramps & curbs with cane & prosthesis with minA.    Time 4    Period Weeks    Status New    Target Date 12/15/19             PT Long Term Goals - 11/17/19 2114      PT LONG TERM GOAL #1   Title Patient verbalizes & demonstrates understanding of prosthetic care comments to enable safe utilization of prosthesis.  (All LTGs Target Date:  01/12/2020)    Time 8    Period Weeks    Status New    Target Date 01/12/20      PT LONG TERM GOAL #2   Title Patient tolerates prosthesis wear >90% of awake hours without skin or residual limb pain issues.    Time 8    Period Weeks    Status New    Target  Date 01/12/20      PT LONG TERM GOAL #3   Title Berg Balance >/=45/56    Time 8    Period Weeks    Status New    Target Date 01/12/20      PT LONG TERM GOAL #4   Title Patient ambulates >500' including grass with cane or less and prosthesis modified independent.    Time 8    Period Weeks    Status New    Target Date 01/12/20      PT LONG TERM GOAL #5   Title Patient negotiates ramps, curbs & stairs with cane or less and prosthesis modified independent.    Time 8    Period Weeks    Status New    Target Date 01/12/20      Additional Long Term Goals   Additional Long Term Goals Yes      PT LONG TERM GOAL #6   Title Patient able to lift & carry 20# box and push / pull activities engaging prosthesis with proper technique.    Time 8    Period Weeks    Status New    Target Date 01/12/20                 Plan - 12/08/19 1015    Clinical Impression Statement Patient's balance & functional strength are improving with progressive activities.  He is on target to meet STGs next week. He  is challenged by balance activities.    Personal Factors and Comorbidities Comorbidity 3+;Past/Current Experience    Comorbidities TTA, DM2, HTN, Hep C,HLD,    Examination-Activity Limitations Carry;Lift;Locomotion Level;Reach Overhead;Squat;Stairs;Stand;Transfers    Examination-Participation Restrictions Community Activity;Driving;Occupation    Stability/Clinical Decision Making Evolving/Moderate complexity    Rehab Potential Good    PT Frequency 2x / week    PT Duration 8 weeks    PT Treatment/Interventions ADLs/Self Care Home Management;DME Instruction;Gait training;Stair training;Functional mobility training;Therapeutic activities;Therapeutic exercise;Balance training;Neuromuscular re-education;Patient/family education;Prosthetic Training;Scar mobilization;Vestibular    PT Next Visit Plan review prosthetic care, prosthetic gait including ramps & curbs with cane. therapeutic exercise     Consulted and Agree with Plan of Care Patient           Patient will benefit from skilled therapeutic intervention in order to improve the following deficits and impairments:  Abnormal gait, Decreased activity tolerance, Decreased balance, Decreased endurance, Decreased knowledge of use of DME, Decreased mobility, Decreased skin integrity, Decreased strength, Dizziness, Increased edema, Postural dysfunction, Prosthetic Dependency  Visit Diagnosis: Unsteadiness on feet  Other abnormalities of gait and mobility  Muscle weakness (generalized)  Abnormal posture     Problem List Patient Active Problem List   Diagnosis Date Noted  . Right below-knee amputee ([name]) 09/03/2019  . Hyponatremia 08/26/2019  . Cutaneous abscess of right foot   . Poorly controlled diabetes mellitus ([name]) 07/16/2019  . FHx: heart disease 10/08/2017  . Type 2 diabetes mellitus with stage 2 chronic kidney disease, without long-term current use of insulin ([name]) 10/08/2017  . Insomnia 07/08/2017  . Smoker 08/17/2015  . COPD (chronic obstructive pulmonary disease) ([name]) 08/17/2015  . 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 Medication management 05/19/2013  . Morbid obesity (BMI 35) 05/19/2013  . Essential hypertension   . Hyperlipidemia associated with type 2 diabetes mellitus ([name])   . Vitamin D deficiency   . History of kidney stones   . Testosterone deficiency   . History of hepatitis C     Vladimir Fasterobin Viviana Trimble, PT DPT 12/08/2019, 11:00 AM  [facility] [address] [place], [place], [address] Phone: [phone]   Fax:  [phone]  Name: [name] MRN: [id number] Date of Birth: 1960-09-08

## 2019-12-09 ENCOUNTER — Ambulatory Visit (INDEPENDENT_AMBULATORY_CARE_PROVIDER_SITE_OTHER): Payer: BC Managed Care – PPO | Admitting: Physician Assistant

## 2019-12-09 ENCOUNTER — Encounter: Payer: Self-pay | Admitting: Physician Assistant

## 2019-12-09 VITALS — Ht 75.0 in | Wt 247.0 lb

## 2019-12-09 DIAGNOSIS — Z89511 Acquired absence of right leg below knee: Secondary | ICD-10-CM

## 2019-12-09 NOTE — Progress Notes (Signed)
Office Visit Note   Patient: [name]           Date of Birth: 10/10/1960           MRN: [id number] Visit Date: 12/09/2019              Requested by: Lucky Cowboy, MD [address] [place],  [place] [address] PCP: Lucky Cowboy, MD  Chief Complaint  Patient presents with  . Right Leg - Routine Post Op    09/01/19 [name]       HPI: Patient presents today 3-1/89-month status post right below-knee amputation.  He has been working with physical therapy and doing well.  He is going to have his prosthetic adjusted slightly today.  He has no complaints  Assessment & Plan: Visit Diagnoses: No diagnosis found.  Plan: Patient may follow-up as needed.  Discussed that he may at some point need a new prosthetic and would need to make an appointment with [place] to have this authorized  Follow-Up Instructions: No follow-ups on file.   Ortho Exam  Patient is alert, oriented, no adenopathy, well-dressed, normal affect, normal respiratory effort. Focused examination demonstrates well-healed amputation stump it is well molded to his prosthetic.  He is ambulating with ease.  He has a slight valgus list to the prosthetic.  Full knee range of motion  Imaging: No results found. No images are attached to the encounter.  Labs: Lab Results  Component Value Date   HGBA1C 5.4 10/21/2019   HGBA1C 6.6 (H) 08/26/2019   HGBA1C 6.7 (H) 07/19/2019   REPTSTATUS 09/06/2019 FINAL 09/01/2019   GRAMSTAIN  09/01/2019    MODERATE WBC PRESENT, PREDOMINANTLY PMN NO ORGANISMS SEEN    CULT  09/01/2019    No growth aerobically or anaerobically. Performed at [facility], [address] [place], [place] [address]    [facility] STREPTOCOCCUS ANGINOSIS 08/20/2019     Lab Results  Component Value Date   ALBUMIN 2.0 (L) 09/06/2019   ALBUMIN 2.0 (L) 08/26/2019   ALBUMIN 2.8 (L) 08/20/2019    Lab Results  Component Value Date   MG 2.0 10/21/2019   MG 2.0 09/03/2019   MG  1.8 08/26/2019   Lab Results  Component Value Date   VD25OH 109 (H) 07/19/2019   VD25OH 62 04/20/2019   VD25OH 51 01/20/2019    No results found for: PREALBUMIN CBC EXTENDED Latest Ref Rng & Units 10/21/2019 09/06/2019 09/03/2019  WBC 3.8 - 10.8 Thousand/uL 12.0(H) 10.3 9.4  RBC 4.20 - 5.80 Million/uL 4.72 3.81(L) 3.69(L)  HGB 13.2 - 17.1 g/dL 03.4 11.8(L) 11.6(L)  HCT 38 - 50 % 44.0 36.5(L) 35.2(L)  PLT 140 - 400 Thousand/uL 252 317 356  NEUTROABS 1,500 - 7,800 cells/uL 5,556 6.1 -  LYMPHSABS 850 - 3,900 cells/uL 5,016(H) 3.1 -     Body mass index is 30.87 kg/m.  Orders:  No orders of the defined types were placed in this encounter.  No orders of the defined types were placed in this encounter.    Procedures: No procedures performed  Clinical Data: No additional findings.  ROS:  All other systems negative, except as noted in the HPI. Review of Systems  Objective: Vital Signs: Ht 6\' 3"  (1.905 m)   Wt 247 lb (112 kg)   BMI 30.87 kg/m   Specialty Comments:  No specialty comments available.  PMFS History: Patient Active Problem List   Diagnosis Date Noted  . Right below-knee amputee ([name]) 09/03/2019  .  Hyponatremia 08/26/2019  . Cutaneous abscess of right foot   . Poorly controlled diabetes mellitus ([name]) 07/16/2019  . FHx: heart disease 10/08/2017  . Type 2 diabetes mellitus with stage 2 chronic kidney disease, without long-term current use of insulin ([name]) 10/08/2017  . Insomnia 07/08/2017  . Smoker 08/17/2015  . COPD (chronic obstructive pulmonary disease) ([name]) 08/17/2015  . 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 Medication management 05/19/2013  . Morbid obesity (BMI 35) 05/19/2013  . Essential hypertension   . Hyperlipidemia associated with type 2 diabetes mellitus ([name])   . Vitamin D deficiency   . History of kidney stones   . Testosterone deficiency   . History of hepatitis  C    Past Medical History:  Diagnosis Date  . Diabetic neuropathy ([name])   . Diverticulitis   . History of hepatitis C    has been treated in the past  . History of kidney stones   . Hyperlipidemia   . Hypertension   . 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[name])   . Other testicular hypofunction   . Severe sepsis ([name]) 08/26/2019  . Type II or unspecified type diabetes mellitus without mention of complication, not stated as uncontrolled   . Vitamin D deficiency     Family History  Problem Relation Age of Onset  . Hypertension Mother   . Cancer Father        colon  . Alzheimer's disease Father   . Stroke Father     Past Surgical History:  Procedure Laterality Date  . AMPUTATION Right 09/01/2019   Procedure: RIGHT BELOW KNEE AMPUTATION;  Surgeon: Nadara Mustard, MD;  Location: [facility] OR;  Service: Orthopedics;  Laterality: Right;  . CHOLECYSTECTOMY  1987  . COLONOSCOPY    . I & D EXTREMITY Right 08/20/2019   Procedure: IRRIGATION & DEBRIDEMENT RIGHT FOOT PARTIAL CALCANEAL EXCISION;  Surgeon: Nadara Mustard, MD;  Location: [facility] OR;  Service: Orthopedics;  Laterality: Right;  . I & D EXTREMITY Right 08/27/2019   Procedure: IRRIGATION AND DEBRIDEMENT  OF FOOT;  Surgeon: Tarry Kos, MD;  Location: [facility] OR;  Service: Orthopedics;  Laterality: Right;  . ORIF TIBIA FRACTURE     x 3 right leg   Social History   Occupational History  . Not on file  Tobacco Use  . Smoking status: Current Every Day Smoker    Packs/day: 1.00    Types: Cigarettes  . Smokeless tobacco: Never Used  Substance and Sexual Activity  . Alcohol use: No  . Drug use: Yes    Types: Marijuana  . Sexual activity: Not on file

## 2019-12-13 ENCOUNTER — Encounter: Payer: BC Managed Care – PPO | Admitting: Physical Therapy

## 2019-12-15 ENCOUNTER — Encounter: Payer: BC Managed Care – PPO | Admitting: Physical Therapy

## 2019-12-15 DIAGNOSIS — Z89511 Acquired absence of right leg below knee: Secondary | ICD-10-CM | POA: Diagnosis not present

## 2019-12-17 ENCOUNTER — Other Ambulatory Visit: Payer: Self-pay | Admitting: Internal Medicine

## 2019-12-20 ENCOUNTER — Ambulatory Visit: Payer: BC Managed Care – PPO | Admitting: Physical Therapy

## 2019-12-20 ENCOUNTER — Encounter: Payer: Self-pay | Admitting: Physical Therapy

## 2019-12-20 ENCOUNTER — Other Ambulatory Visit: Payer: Self-pay

## 2019-12-20 DIAGNOSIS — M6281 Muscle weakness (generalized): Secondary | ICD-10-CM | POA: Diagnosis not present

## 2019-12-20 DIAGNOSIS — R293 Abnormal posture: Secondary | ICD-10-CM

## 2019-12-20 DIAGNOSIS — R2689 Other abnormalities of gait and mobility: Secondary | ICD-10-CM

## 2019-12-20 DIAGNOSIS — R2681 Unsteadiness on feet: Secondary | ICD-10-CM | POA: Diagnosis not present

## 2019-12-20 NOTE — Therapy (Signed)
[facility] [address] [place], [place], [address] Phone: [phone]   Fax:  [phone]  Physical Therapy Treatment  Patient Details  Name: [name] MRN: [id number] Date of Birth: 02-16-1960 Referring Provider (PT): [name], [place]   Encounter Date: 12/20/2019   PT End of Session - 12/20/19 1021    Visit Number 8    Number of Visits 17    Authorization Type BCBS comm    Authorization Time Period $35 co-pay    PT Start Time 1015    PT Stop Time 1100    PT Time Calculation (min) 45 min    Equipment Utilized During Treatment Gait belt    Activity Tolerance Patient tolerated treatment well    Behavior During Therapy [facility] for tasks assessed/performed           Past Medical History:  Diagnosis Date  . Diabetic neuropathy ([place])   . Diverticulitis   . History of hepatitis C    has been treated in the past  . History of kidney stones   . Hyperlipidemia   . Hypertension   . 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[place])   . Other testicular hypofunction   . Severe sepsis ([place]) 08/26/2019  . Type II or unspecified type diabetes mellitus without mention of complication, not stated as uncontrolled   . Vitamin D deficiency     Past Surgical History:  Procedure Laterality Date  . AMPUTATION Right 09/01/2019   Procedure: RIGHT BELOW KNEE AMPUTATION;  Surgeon: Newt Minion, MD;  Location: [place];  Service: Orthopedics;  Laterality: Right;  . CHOLECYSTECTOMY  1987  . COLONOSCOPY    . I & D EXTREMITY Right 08/20/2019   Procedure: IRRIGATION & DEBRIDEMENT RIGHT FOOT PARTIAL CALCANEAL EXCISION;  Surgeon: Newt Minion, MD;  Location: [place];  Service: Orthopedics;  Laterality: Right;  . I & D EXTREMITY Right 08/27/2019   Procedure: IRRIGATION AND DEBRIDEMENT  OF FOOT;  Surgeon: Leandrew Koyanagi, MD;  Location: [place];  Service: Orthopedics;  Laterality: Right;  . ORIF TIBIA FRACTURE     x 3 right leg    There were no vitals filed for this  visit.   Subjective Assessment - 12/20/19 1015    Subjective His left shoulder hurt so unable to drive or use it for week.  It is better now.    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 No/denies                             [facility] Adult PT Treatment/Exercise - 12/20/19 1015      Transfers   Transfers Sit to Stand;Stand to Sit;Floor to Transfer    Sit to Stand 5: Supervision;With upper extremity assist;With armrests;From chair/3-in-1;Other (comment)   requires RW support to stabilize   Stand to Sit 5: Supervision;With upper extremity assist;With armrests;To chair/3-in-1;Other (comment)   requires RW for stability   Floor to Transfer 5: Supervision;With upper extremity assist   pushing off mat table     Ambulation/Gait   Ambulation/Gait Yes    Ambulation/Gait Assistance 5: Supervision    Ambulation Distance (Feet) 100 Feet   100'+ with RW & 100' X 2 w/cane  Assistive device Prosthesis;Straight cane;None   entered w/cane, ambulated in clinic no device   Ramp --    Curb --      High Level Balance   High Level Balance Activities Side stepping;Backward walking;Other (comment);Turns   walking with eyes closed to simulate walking in dark.    High Level Balance Comments tactile & verbal cues on technique      Therapeutic Activites    Therapeutic Activities --    Other Therapeutic Activities --      Neuro Re-ed    Neuro Re-ed Details  --      Knee/Hip Exercises: Stretches   Passive Hamstring Stretch Right;Left;20 seconds;2 reps    Passive Hamstring Stretch Limitations LE extended on leg press plate with strap to DF foot      Knee/Hip Exercises: Aerobic   Nustep --      Knee/Hip Exercises: Machines for Strengthening   Cybex Leg Press  131# shuttle leg press BLEs 15 reps 2 sets Knee stabilization in ext lifting LLE off plate for 3 seconds;  RLE only 56# 15 reps 2 sets      Knee/Hip Exercises: Seated   Sit to Sand 10 reps;without UE support   from 24" bar stool     Prosthetics   Prosthetic Care Comments  reviewed adjusting ply fit with standing & walking snugness, may be loose in sitting some    Current prosthetic wear tolerance (days/week)  daily    Current prosthetic wear tolerance (#hours/day)  most of awake hours    Current prosthetic weight-bearing tolerance (hours/day)  Patient tolerates standing 10 min with partial weight on prosthesis without pain or limb discomfort    Edema pitting edema    Residual limb condition  no open areas, cylinderical shape, good hair growth, normal color & temperature, sweaty skin    Education Provided Correct ply sock adjustment;Other (comment);Proper wear schedule/adjustment   see prosthetic care comments   Person(s) Educated Patient    Education Method Explanation;Verbal cues    Education Method Verbalized understanding;Verbal cues required               Balance Exercises - 12/20/19 1015      Balance Exercises: Standing   Standing Eyes Opened Wide (BOA);Foam/compliant surface;Head turns;5 reps   head turns right/left, up/down & diagonals   Standing Eyes Opened Limitations tactile, visual & verbal cues for ankle/residual limb & hip strategies    Standing Eyes Closed Wide (BOA);Foam/compliant surface;2 reps;30 secs    Standing Eyes Closed Limitations tactile / manual cues for balance reactions    Tandem Stance Eyes open;Foam/compliant surface;Intermittent upper extremity support;2 reps   60 sec prosthesis forward & prostheis back   Stepping Strategy Anterior;Posterior;Foam/compliant surface;5 reps    Stepping Strategy Limitations anticipatory stepping off foam beam & stabilizing without UE support. required intermittent touch decreasing with practice.     Rockerboard  Anterior/posterior;Lateral;EO;10 reps;Intermittent UE support   round board, circles CW & CCW   Rockerboard Limitations wt shifts w/hips & knees, stop motion & recover               PT Short Term Goals - 12/20/19 1022      PT SHORT TERM GOAL #1   Title Patient demonstrates understanding of donning prosthesis, cleaning & adjusting ply socks. (All STGs Target Date: 12/15/2019)    Baseline MET 12/20/2019    Time 4    Period Weeks    Status Achieved    Target Date 12/15/19  PT SHORT TERM GOAL #2   Title Patient tolerates prosthesis wear >12hrs/ day without skin issues.    Baseline MET 12/20/2019    Time 4    Period Weeks    Status Achieved    Target Date 12/15/19      PT SHORT TERM GOAL #3   Title Patient able to stand 60sec without UE support and pick up item from floor with supervision.    Baseline MET 12/20/2019    Time 4    Period Weeks    Status Achieved    Target Date 12/15/19      PT SHORT TERM GOAL #4   Title Patient ambulates 250' with cane & prosthesis with supervision.    Baseline MET 12/20/2019    Time 4    Period Weeks    Status Achieved    Target Date 12/15/19      PT SHORT TERM GOAL #5   Title Patient negotiates stairs with single rail, ramps & curbs with cane & prosthesis with minA.    Baseline MET 12/20/2019    Time 4    Period Weeks    Status Achieved    Target Date 12/15/19             PT Long Term Goals - 12/20/19 1024      PT LONG TERM GOAL #1   Title Patient verbalizes & demonstrates understanding of prosthetic care comments to enable safe utilization of prosthesis.  (All LTGs Target Date:  01/12/2020)    Time 8    Period Weeks    Status On-going    Target Date 01/12/20      PT LONG TERM GOAL #2   Title Patient tolerates prosthesis wear >90% of awake hours without skin or residual limb pain issues.    Time 8    Period Weeks    Status On-going    Target Date 01/12/20      PT LONG TERM GOAL #3   Title [place] Balance >/=45/56     Time 8    Period Weeks    Status On-going    Target Date 01/12/20      PT LONG TERM GOAL #4   Title Patient ambulates >500' including grass with cane or less and prosthesis modified independent.    Time 8    Period Weeks    Status On-going    Target Date 01/12/20      PT LONG TERM GOAL #5   Title Patient negotiates ramps, curbs & stairs with cane or less and prosthesis modified independent.    Time 8    Period Weeks    Status On-going    Target Date 01/12/20      PT LONG TERM GOAL #6   Title Patient able to lift & carry 20# box and push / pull activities engaging prosthesis with proper technique.    Time 8    Period Weeks    Status On-going    Target Date 01/12/20                 Plan - 12/20/19 1021    Clinical Impression Statement Patient met all STGs for first 30 days.  PT session worked on balance reactions including ankle/residual limb, hip & step strategies.    Personal Factors and Comorbidities Comorbidity 3+;Past/Current Experience    Comorbidities TTA, DM2, HTN, Hep C,HLD,    Examination-Activity Limitations Carry;Lift;Locomotion Level;Reach Overhead;Squat;Stairs;Stand;Transfers    Examination-Participation Restrictions Community Activity;Driving;Occupation    Stability/Clinical Decision  Making Evolving/Moderate complexity    Rehab Potential Good    PT Frequency 2x / week    PT Duration 8 weeks    PT Treatment/Interventions ADLs/Self Care Home Management;DME Instruction;Gait training;Stair training;Functional mobility training;Therapeutic activities;Therapeutic exercise;Balance training;Neuromuscular re-education;Patient/family education;Prosthetic Training;Scar mobilization;Vestibular    PT Next Visit Plan review prosthetic care, prosthetic gait including ramps & curbs with cane. therapeutic exercise    Consulted and Agree with Plan of Care Patient           Patient will benefit from skilled therapeutic intervention in order to improve the following  deficits and impairments:  Abnormal gait, Decreased activity tolerance, Decreased balance, Decreased endurance, Decreased knowledge of use of DME, Decreased mobility, Decreased skin integrity, Decreased strength, Dizziness, Increased edema, Postural dysfunction, Prosthetic Dependency  Visit Diagnosis: Unsteadiness on feet  Other abnormalities of gait and mobility  Muscle weakness (generalized)  Abnormal posture     Problem List Patient Active Problem List   Diagnosis Date Noted  . Right below-knee amputee ([place]) 09/03/2019  . Hyponatremia 08/26/2019  . Cutaneous abscess of right foot   . Poorly controlled diabetes mellitus ([place]) 07/16/2019  . FHx: heart disease 10/08/2017  . Type 2 diabetes mellitus with stage 2 chronic kidney disease, without long-term current use of insulin ([place]) 10/08/2017  . Insomnia 07/08/2017  . Smoker 08/17/2015  . COPD (chronic obstructive pulmonary disease) ([place]) 08/17/2015  . 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 CKD stage 2 due to type 2 diabetes mellitus ([place]) 08/31/2013  . Medication management 05/19/2013  . Morbid obesity (BMI 35) 05/19/2013  . Essential hypertension   . Hyperlipidemia associated with type 2 diabetes mellitus ([place])   . Vitamin D deficiency   . History of kidney stones   . Testosterone deficiency   . History of hepatitis C     Jamey Reas PT, DPT 12/20/2019, 12:56 PM  [facility] [address] [place], [place], [address] Phone: [phone]   Fax:  [phone]  Name: [name] MRN: [id number] Date of Birth: [birth year]

## 2019-12-22 ENCOUNTER — Encounter: Payer: Self-pay | Admitting: Physical Therapy

## 2019-12-22 ENCOUNTER — Other Ambulatory Visit: Payer: Self-pay

## 2019-12-22 ENCOUNTER — Ambulatory Visit: Payer: BC Managed Care – PPO | Admitting: Physical Therapy

## 2019-12-22 DIAGNOSIS — R293 Abnormal posture: Secondary | ICD-10-CM | POA: Diagnosis not present

## 2019-12-22 DIAGNOSIS — M6281 Muscle weakness (generalized): Secondary | ICD-10-CM

## 2019-12-22 DIAGNOSIS — R2689 Other abnormalities of gait and mobility: Secondary | ICD-10-CM | POA: Diagnosis not present

## 2019-12-22 DIAGNOSIS — R2681 Unsteadiness on feet: Secondary | ICD-10-CM | POA: Diagnosis not present

## 2019-12-22 NOTE — Therapy (Signed)
[facility] [address] [place], [place], [address] Phone: [phone]   Fax:  [phone]  Physical Therapy Treatment  Patient Details  Name: [name] MRN: [id number] Date of Birth: [birth year] Referring Provider (PT): [name] Persons, [place]   Encounter Date: 12/22/2019   PT End of Session - 12/22/19 1253    Visit Number 9    Number of Visits 17    Authorization Type BCBS comm    Authorization Time Period $35 co-pay    PT Start Time 1012    PT Stop Time 1100    PT Time Calculation (min) 48 min    Equipment Utilized During Treatment Gait belt    Activity Tolerance Patient tolerated treatment well    Behavior During Therapy WFL for tasks assessed/performed           Past Medical History:  Diagnosis Date   Diabetic neuropathy ([place])    Diverticulitis    History of hepatitis C    has been treated in the past   History of kidney stones    Hyperlipidemia    Hypertension    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[place])    Other testicular hypofunction    Severe sepsis ([place]) 08/26/2019   Type II or unspecified type diabetes mellitus without mention of complication, not stated as uncontrolled    Vitamin D deficiency     Past Surgical History:  Procedure Laterality Date   AMPUTATION Right 09/01/2019   Procedure: RIGHT BELOW KNEE AMPUTATION;  Surgeon: Newt Minion, MD;  Location: [place];  Service: Orthopedics;  Laterality: Right;   CHOLECYSTECTOMY  1987   COLONOSCOPY     I & D EXTREMITY Right 08/20/2019   Procedure: IRRIGATION & DEBRIDEMENT RIGHT FOOT PARTIAL CALCANEAL EXCISION;  Surgeon: Newt Minion, MD;  Location: [place];  Service: Orthopedics;  Laterality: Right;   I & D EXTREMITY Right 08/27/2019   Procedure: IRRIGATION AND DEBRIDEMENT  OF FOOT;  Surgeon: Leandrew Koyanagi, MD;  Location: [place];  Service: Orthopedics;  Laterality: Right;   ORIF TIBIA FRACTURE     x 3 right leg    There were no vitals filed for this  visit.   Subjective Assessment - 12/22/19 1012    Subjective No issues with prosthesis.    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 No/denies                             [facility] Adult PT Treatment/Exercise - 12/22/19 1015      Transfers   Transfers Sit to Stand;Stand to Sit;Floor to Transfer    Sit to Stand 5: Supervision;With upper extremity assist;With armrests;From chair/3-in-1;Other (comment)   requires RW support to stabilize   Stand to Sit 5: Supervision;With upper extremity assist;With armrests;To chair/3-in-1;Other (comment)   requires RW for stability   Floor to Transfer 5: Supervision;With upper extremity assist   pushing off mat table     Ambulation/Gait   Ambulation/Gait Yes    Ambulation/Gait Assistance 5: Supervision    Ambulation Distance (Feet) 100 Feet   100'+ with RW & 100' X 2 w/cane   Assistive device Prosthesis;Straight cane;None   entered w/cane, ambulated in clinic no  device   Ramp 5: Supervision   prosthesis only   Ramp Details (indicate cue type and reason) cues on technique    Curb 5: Supervision   prosthesis   Curb Details (indicate cue type and reason) worked on technique to lead up & down with either LE      High Level Balance   High Level Balance Activities Side stepping;Backward walking;Other (comment);Turns   walking with eyes closed to simulate walking in dark.    High Level Balance Comments tactile & verbal cues on technique      Therapeutic Activites    Lifting PT demo & verbal cues on technique to lift 18# boxes from floor with TTA prosthesis. Pt return demo 2 reps with min guard.       Knee/Hip Exercises: Stretches   Passive Hamstring Stretch Right;Left;20 seconds;2 reps    Passive Hamstring Stretch  Limitations LE extended on leg press plate with strap to DF foot      Knee/Hip Exercises: Aerobic   Nustep level 7 with BLEs & BUEs 5 min warm-up while PT did subjective      Knee/Hip Exercises: Machines for Strengthening   Cybex Leg Press --      Knee/Hip Exercises: Seated   Sit to Sand 10 reps;without UE support   from 24" bar stool     Prosthetics   Prosthetic Care Comments  --    Current prosthetic wear tolerance (days/week)  daily    Current prosthetic wear tolerance (#hours/day)  most of awake hours    Current prosthetic weight-bearing tolerance (hours/day)  Patient tolerates standing 10 min with partial weight on prosthesis without pain or limb discomfort    Edema pitting edema    Residual limb condition  no open areas, cylinderical shape, good hair growth, normal color & temperature, sweaty skin    Education Provided --               Balance Exercises - 12/22/19 1015      Balance Exercises: Standing   Standing Eyes Opened Wide (BOA);Foam/compliant surface;Head turns;5 reps   head turns right/left, up/down & diagonals   Standing Eyes Opened Limitations tactile, visual & verbal cues for ankle/residual limb & hip strategies    Standing Eyes Closed Wide (BOA);Foam/compliant surface;2 reps;30 secs    Standing Eyes Closed Limitations tactile / manual cues for balance reactions    Tandem Stance Eyes open;Foam/compliant surface;Intermittent upper extremity support;2 reps   60 sec prosthesis forward & prostheis back   Stepping Strategy Anterior;Posterior;Foam/compliant surface;5 reps    Stepping Strategy Limitations anticipatory & reactionary stepping off foam beam & stabilizing without UE support. required intermittent touch decreasing with practice.     Rockerboard Anterior/posterior;Lateral;EO;10 reps;Intermittent UE support   round board, circles CW & CCW   Rockerboard Limitations wt shifts w/hips & knees, stop motion & recover               PT Short Term Goals -  12/20/19 1022      PT SHORT TERM GOAL #1   Title Patient demonstrates understanding of donning prosthesis, cleaning & adjusting ply socks. (All STGs Target Date: 12/15/2019)    Baseline MET 12/20/2019    Time 4    Period Weeks    Status Achieved    Target Date 12/15/19      PT SHORT TERM GOAL #2   Title Patient tolerates prosthesis wear >12hrs/ day without skin issues.    Baseline MET 12/20/2019    Time 4  Period Weeks    Status Achieved    Target Date 12/15/19      PT SHORT TERM GOAL #3   Title Patient able to stand 60sec without UE support and pick up item from floor with supervision.    Baseline MET 12/20/2019    Time 4    Period Weeks    Status Achieved    Target Date 12/15/19      PT SHORT TERM GOAL #4   Title Patient ambulates 250' with cane & prosthesis with supervision.    Baseline MET 12/20/2019    Time 4    Period Weeks    Status Achieved    Target Date 12/15/19      PT SHORT TERM GOAL #5   Title Patient negotiates stairs with single rail, ramps & curbs with cane & prosthesis with minA.    Baseline MET 12/20/2019    Time 4    Period Weeks    Status Achieved    Target Date 12/15/19             PT Long Term Goals - 12/20/19 1024      PT LONG TERM GOAL #1   Title Patient verbalizes & demonstrates understanding of prosthetic care comments to enable safe utilization of prosthesis.  (All LTGs Target Date:  01/12/2020)    Time 8    Period Weeks    Status On-going    Target Date 01/12/20      PT LONG TERM GOAL #2   Title Patient tolerates prosthesis wear >90% of awake hours without skin or residual limb pain issues.    Time 8    Period Weeks    Status On-going    Target Date 01/12/20      PT LONG TERM GOAL #3   Title Berg Balance >/=45/56    Time 8    Period Weeks    Status On-going    Target Date 01/12/20      PT LONG TERM GOAL #4   Title Patient ambulates >500' including grass with cane or less and prosthesis modified independent.    Time  8    Period Weeks    Status On-going    Target Date 01/12/20      PT LONG TERM GOAL #5   Title Patient negotiates ramps, curbs & stairs with cane or less and prosthesis modified independent.    Time 8    Period Weeks    Status On-going    Target Date 01/12/20      PT LONG TERM GOAL #6   Title Patient able to lift & carry 20# box and push / pull activities engaging prosthesis with proper technique.    Time 8    Period Weeks    Status On-going    Target Date 01/12/20                 Plan - 12/22/19 1012    Clinical Impression Statement PT progressed lifting to 18# box with instruction in technique which improved.  PT also initiated work on ability to step up & down with prosthetic side and progressed to curb leading with either LE.    Personal Factors and Comorbidities Comorbidity 3+;Past/Current Experience    Comorbidities TTA, DM2, HTN, Hep C,HLD,    Examination-Activity Limitations Carry;Lift;Locomotion Level;Reach Overhead;Squat;Stairs;Stand;Transfers    Examination-Participation Restrictions Community Activity;Driving;Occupation    Stability/Clinical Decision Making Evolving/Moderate complexity    Rehab Potential Good    PT Frequency 2x / week  PT Duration 8 weeks    PT Treatment/Interventions ADLs/Self Care Home Management;DME Instruction;Gait training;Stair training;Functional mobility training;Therapeutic activities;Therapeutic exercise;Balance training;Neuromuscular re-education;Patient/family education;Prosthetic Training;Scar mobilization;Vestibular    PT Next Visit Plan review prosthetic care, prosthetic gait including ramps & curbs with cane. therapeutic exercise    Consulted and Agree with Plan of Care Patient           Patient will benefit from skilled therapeutic intervention in order to improve the following deficits and impairments:  Abnormal gait, Decreased activity tolerance, Decreased balance, Decreased endurance, Decreased knowledge of use of DME,  Decreased mobility, Decreased skin integrity, Decreased strength, Dizziness, Increased edema, Postural dysfunction, Prosthetic Dependency  Visit Diagnosis: Unsteadiness on feet  Other abnormalities of gait and mobility  Muscle weakness (generalized)  Abnormal posture     Problem List Patient Active Problem List   Diagnosis Date Noted   Right below-knee amputee ([place]) 09/03/2019   Hyponatremia 08/26/2019   Cutaneous abscess of right foot    Poorly controlled diabetes mellitus ([place]) 07/16/2019   FHx: heart disease 10/08/2017   Type 2 diabetes mellitus with stage 2 chronic kidney disease, without long-term current use of insulin ([place]) 10/08/2017   Insomnia 07/08/2017   Smoker 08/17/2015   COPD (chronic obstructive pulmonary disease) ([place]) 08/17/2015   T2_NIDDM w/Peripheral Neuropathy 04/01/2014   CKD stage 2 due to type 2 diabetes mellitus ([place]) 08/31/2013   Medication management 05/19/2013   Morbid obesity (BMI 35) 05/19/2013   Essential hypertension    Hyperlipidemia associated with type 2 diabetes mellitus ([place])    Vitamin D deficiency    History of kidney stones    Testosterone deficiency    History of hepatitis C     Jamey Reas PT, DPT 12/22/2019, 12:55 PM  [facility] [address] [place], [place], [address] Phone: [phone]   Fax:  [phone]  Name: [name] MRN: [id number] Date of Birth: Jan 16, 1961

## 2019-12-27 ENCOUNTER — Encounter: Payer: Self-pay | Admitting: Physical Therapy

## 2019-12-27 ENCOUNTER — Other Ambulatory Visit: Payer: Self-pay | Admitting: Internal Medicine

## 2019-12-27 ENCOUNTER — Ambulatory Visit: Payer: BC Managed Care – PPO | Admitting: Physical Therapy

## 2019-12-27 ENCOUNTER — Other Ambulatory Visit: Payer: Self-pay

## 2019-12-27 DIAGNOSIS — R2681 Unsteadiness on feet: Secondary | ICD-10-CM

## 2019-12-27 DIAGNOSIS — M6281 Muscle weakness (generalized): Secondary | ICD-10-CM

## 2019-12-27 DIAGNOSIS — R293 Abnormal posture: Secondary | ICD-10-CM

## 2019-12-27 DIAGNOSIS — R2689 Other abnormalities of gait and mobility: Secondary | ICD-10-CM

## 2019-12-27 MED ORDER — CITALOPRAM HYDROBROMIDE 40 MG PO TABS
ORAL_TABLET | ORAL | 0 refills | Status: DC
Start: 2019-12-27 — End: 2020-03-12

## 2019-12-27 NOTE — Therapy (Signed)
[facility] [address] [place], [place], [address] Phone: [phone]   Fax:  [phone]  Physical Therapy Treatment  Patient Details  Name: [name] MRN: [id number] Date of Birth: Oct 16, 1960 Referring Provider (PT): [name] Persons, [place]   Encounter Date: 12/27/2019   PT End of Session - 12/27/19 1011    Visit Number 10    Number of Visits 17    Authorization Type BCBS comm    Authorization Time Period $35 co-pay    PT Start Time 1008    PT Stop Time 1052    PT Time Calculation (min) 44 min    Equipment Utilized During Treatment Gait belt    Activity Tolerance Patient tolerated treatment well    Behavior During Therapy WFL for tasks assessed/performed           Past Medical History:  Diagnosis Date  . Diabetic neuropathy ([place])   . Diverticulitis   . History of hepatitis C    has been treated in the past  . History of kidney stones   . Hyperlipidemia   . Hypertension   . 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[place])   . Other testicular hypofunction   . Severe sepsis ([place]) 08/26/2019  . Type II or unspecified type diabetes mellitus without mention of complication, not stated as uncontrolled   . Vitamin D deficiency     Past Surgical History:  Procedure Laterality Date  . AMPUTATION Right 09/01/2019   Procedure: RIGHT BELOW KNEE AMPUTATION;  Surgeon: Newt Minion, MD;  Location: [place];  Service: Orthopedics;  Laterality: Right;  . CHOLECYSTECTOMY  1987  . COLONOSCOPY    . I & D EXTREMITY Right 08/20/2019   Procedure: IRRIGATION & DEBRIDEMENT RIGHT FOOT PARTIAL CALCANEAL EXCISION;  Surgeon: Newt Minion, MD;  Location: [place];  Service: Orthopedics;  Laterality: Right;  . I & D EXTREMITY Right 08/27/2019   Procedure: IRRIGATION AND DEBRIDEMENT  OF FOOT;  Surgeon: Leandrew Koyanagi, MD;  Location: [place];  Service: Orthopedics;  Laterality: Right;  . ORIF TIBIA FRACTURE     x 3 right leg    There were no vitals filed for this  visit.   Subjective Assessment - 12/27/19 1011    Subjective He feels like he learns something every day he has PT.  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 No/denies                             [facility] Adult PT Treatment/Exercise - 12/27/19 1008      Transfers   Transfers Sit to Stand;Stand to Sit;Floor to Transfer    Sit to Stand 5: Supervision;With upper extremity assist;With armrests;From chair/3-in-1;Other (comment)   requires RW support to stabilize   Stand to Sit 5: Supervision;With upper extremity assist;With armrests;To chair/3-in-1;Other (comment)   requires RW for stability   Floor to Transfer 5: Supervision;With upper extremity assist   pushing off mat table     Ambulation/Gait   Ambulation/Gait Yes    Ambulation/Gait Assistance 5: Supervision    Ambulation Distance (Feet) 100 Feet   100'+ with  RW & 100' X 2 w/cane   Assistive device Prosthesis;Straight cane;None   entered w/cane, ambulated in clinic no device   Ramp 5: Supervision   prosthesis only   Curb 5: Supervision   prosthesis     High Level Balance   High Level Balance Activities Side stepping;Backward walking;Other (comment);Turns   walking with eyes closed to simulate walking in dark.    High Level Balance Comments tactile & verbal cues on technique      Therapeutic Activites    Lifting PT demo & verbal cues on technique to lift 18# boxes from floor with TTA prosthesis. Pt return demo 2 reps with min guard.       Knee/Hip Exercises: Stretches   Passive Hamstring Stretch Right;Left;20 seconds;2 reps    Passive Hamstring Stretch Limitations LE extended on leg press plate with strap to DF foot      Knee/Hip  Exercises: Aerobic   Nustep level 10 with BLEs & BUEs 5 min warm-up while PT did subjective      Knee/Hip Exercises: Machines for Strengthening   Cybex Leg Press 131# shuttle leg press back flat BLEs 15 reps 1 sets Knee stabilization in ext lifting LLE off plate for 3 seconds;  RLE only 56# 15 reps 1 sets      Knee/Hip Exercises: Seated   Sit to Sand 10 reps;without UE support   from 24" bar stool     Prosthetics   Prosthetic Care Comments  fall risk  when prosthesis off during awake hours.     Current prosthetic wear tolerance (days/week)  daily    Current prosthetic wear tolerance (#hours/day)  most of awake hours    Current prosthetic weight-bearing tolerance (hours/day)  Patient tolerates standing 10 min with partial weight on prosthesis without pain or limb discomfort    Edema pitting edema    Residual limb condition  no open areas, cylinderical shape, good hair growth, normal color & temperature, sweaty skin    Education Provided Other (comment)   prosthetic care comments   Person(s) Educated Patient    Education Method Explanation;Verbal cues    Education Method Verbalized understanding    Donning Prosthesis Independent    Doffing Prosthesis Independent               Balance Exercises - 12/27/19 1008      Balance Exercises: Standing   Standing Eyes Opened Wide ([place]);Foam/compliant surface;Head turns;5 reps   head turns right/left, up/down & diagonals   Standing Eyes Opened Limitations tactile, visual & verbal cues for ankle/residual limb & hip strategies    Standing Eyes Closed Wide (BOA);Foam/compliant surface;2 reps;30 secs    Standing Eyes Closed Limitations tactile / manual cues for balance reactions    Tandem Stance Eyes open;Foam/compliant surface;Intermittent upper extremity support;2 reps   60 sec prosthesis forward & prostheis back   Stepping Strategy Anterior;Posterior;Foam/compliant surface;5 reps    Stepping Strategy Limitations anticipatory & reactionary  stepping off foam beam & stabilizing without UE support. required intermittent touch decreasing with practice.     Rockerboard Anterior/posterior;Lateral;EO;10 reps;Intermittent UE support   round board, circles CW & CCW   Rockerboard Limitations wt shifts w/hips & knees, stop motion & recover    Step Ups Forward;UE support 2   BOSU round side up   Step Ups Limitations step up & step down 10 reps    Other Standing Exercises BOSU round side up Y reaches BUE support 5 reps.  PT Short Term Goals - 12/20/19 1022      PT SHORT TERM GOAL #1   Title Patient demonstrates understanding of donning prosthesis, cleaning & adjusting ply socks. (All STGs Target Date: 12/15/2019)    Baseline MET 12/20/2019    Time 4    Period Weeks    Status Achieved    Target Date 12/15/19      PT SHORT TERM GOAL #2   Title Patient tolerates prosthesis wear >12hrs/ day without skin issues.    Baseline MET 12/20/2019    Time 4    Period Weeks    Status Achieved    Target Date 12/15/19      PT SHORT TERM GOAL #3   Title Patient able to stand 60sec without UE support and pick up item from floor with supervision.    Baseline MET 12/20/2019    Time 4    Period Weeks    Status Achieved    Target Date 12/15/19      PT SHORT TERM GOAL #4   Title Patient ambulates 250' with cane & prosthesis with supervision.    Baseline MET 12/20/2019    Time 4    Period Weeks    Status Achieved    Target Date 12/15/19      PT SHORT TERM GOAL #5   Title Patient negotiates stairs with single rail, ramps & curbs with cane & prosthesis with minA.    Baseline MET 12/20/2019    Time 4    Period Weeks    Status Achieved    Target Date 12/15/19             PT Long Term Goals - 12/20/19 1024      PT LONG TERM GOAL #1   Title Patient verbalizes & demonstrates understanding of prosthetic care comments to enable safe utilization of prosthesis.  (All LTGs Target Date:  01/12/2020)    Time 8    Period  Weeks    Status On-going    Target Date 01/12/20      PT LONG TERM GOAL #2   Title Patient tolerates prosthesis wear >90% of awake hours without skin or residual limb pain issues.    Time 8    Period Weeks    Status On-going    Target Date 01/12/20      PT LONG TERM GOAL #3   Title Berg Balance >/=45/56    Time 8    Period Weeks    Status On-going    Target Date 01/12/20      PT LONG TERM GOAL #4   Title Patient ambulates >500' including grass with cane or less and prosthesis modified independent.    Time 8    Period Weeks    Status On-going    Target Date 01/12/20      PT LONG TERM GOAL #5   Title Patient negotiates ramps, curbs & stairs with cane or less and prosthesis modified independent.    Time 8    Period Weeks    Status On-going    Target Date 01/12/20      PT LONG TERM GOAL #6   Title Patient able to lift & carry 20# box and push / pull activities engaging prosthesis with proper technique.    Time 8    Period Weeks    Status On-going    Target Date 01/12/20                 Plan - 12/27/19  1011    Clinical Impression Statement PT progressed balance activities today.  He is improving his ankle / residual  limb, hip & step strategies.  He is on target to meet his LTGs.    Personal Factors and Comorbidities Comorbidity 3+;Past/Current Experience    Comorbidities TTA, DM2, HTN, Hep C,HLD,    Examination-Activity Limitations Carry;Lift;Locomotion Level;Reach Overhead;Squat;Stairs;Stand;Transfers    Examination-Participation Restrictions Community Activity;Driving;Occupation    Stability/Clinical Decision Making Evolving/Moderate complexity    Rehab Potential Good    PT Frequency 2x / week    PT Duration 8 weeks    PT Treatment/Interventions ADLs/Self Care Home Management;DME Instruction;Gait training;Stair training;Functional mobility training;Therapeutic activities;Therapeutic exercise;Balance training;Neuromuscular re-education;Patient/family  education;Prosthetic Training;Scar mobilization;Vestibular    PT Next Visit Plan review prosthetic care, prosthetic gait including ramps & curbs with cane. therapeutic exercise    Consulted and Agree with Plan of Care Patient           Patient will benefit from skilled therapeutic intervention in order to improve the following deficits and impairments:  Abnormal gait, Decreased activity tolerance, Decreased balance, Decreased endurance, Decreased knowledge of use of DME, Decreased mobility, Decreased skin integrity, Decreased strength, Dizziness, Increased edema, Postural dysfunction, Prosthetic Dependency  Visit Diagnosis: Unsteadiness on feet  Other abnormalities of gait and mobility  Muscle weakness (generalized)  Abnormal posture     Problem List Patient Active Problem List   Diagnosis Date Noted  . Right below-knee amputee ([place]) 09/03/2019  . Hyponatremia 08/26/2019  . Cutaneous abscess of right foot   . Poorly controlled diabetes mellitus ([place]) 07/16/2019  . FHx: heart disease 10/08/2017  . Type 2 diabetes mellitus with stage 2 chronic kidney disease, without long-term current use of insulin ([place]) 10/08/2017  . Insomnia 07/08/2017  . Smoker 08/17/2015  . COPD (chronic obstructive pulmonary disease) ([place]) 08/17/2015  . 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 CKD stage 2 due to type 2 diabetes mellitus ([place]) 08/31/2013  . Medication management 05/19/2013  . Morbid obesity (BMI 35) 05/19/2013  . Essential hypertension   . Hyperlipidemia associated with type 2 diabetes mellitus ([place])   . Vitamin D deficiency   . History of kidney stones   . Testosterone deficiency   . History of hepatitis C     Carlynn Purl, DPT 12/27/2019, 11:14 AM  [facility] [address] [place], [place], [address] Phone: [phone]   Fax:  [phone]  Name: [name] MRN: [id number] Date of Birth: 23-Jul-1960

## 2019-12-29 ENCOUNTER — Telehealth: Payer: Self-pay | Admitting: Physical Therapy

## 2019-12-29 ENCOUNTER — Other Ambulatory Visit: Payer: Self-pay

## 2019-12-29 ENCOUNTER — Encounter: Payer: Self-pay | Admitting: Physical Therapy

## 2019-12-29 ENCOUNTER — Ambulatory Visit: Payer: BC Managed Care – PPO | Admitting: Physical Therapy

## 2019-12-29 DIAGNOSIS — R2681 Unsteadiness on feet: Secondary | ICD-10-CM

## 2019-12-29 DIAGNOSIS — M6281 Muscle weakness (generalized): Secondary | ICD-10-CM | POA: Diagnosis not present

## 2019-12-29 DIAGNOSIS — R2689 Other abnormalities of gait and mobility: Secondary | ICD-10-CM

## 2019-12-29 DIAGNOSIS — R293 Abnormal posture: Secondary | ICD-10-CM | POA: Diagnosis not present

## 2019-12-29 NOTE — Therapy (Signed)
[facility] Physical Therapy [address] [place], [place], [address] Phone: [phone]   Fax:  [phone]  Physical Therapy Treatment  Patient Details  Name: [name] MRN: [id number] Date of Birth: [birth year] Referring Provider (PT): [name] Persons, [place]   Encounter Date: 12/29/2019   PT End of Session - 12/29/19 1015    Visit Number 11    Number of Visits 17    Authorization Type BCBS comm    Authorization Time Period $35 co-pay    PT Start Time 2725    PT Stop Time 1100    PT Time Calculation (min) 45 min    Equipment Utilized During Treatment Gait belt    Activity Tolerance Patient tolerated treatment well    Behavior During Therapy [facility] for tasks assessed/performed           Past Medical History:  Diagnosis Date  . Diabetic neuropathy ([place])   . Diverticulitis   . History of hepatitis C    has been treated in the past  . History of kidney stones   . Hyperlipidemia   . Hypertension   . 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[place])   . Other testicular hypofunction   . Severe sepsis ([place]) 08/26/2019  . Type II or unspecified type diabetes mellitus without mention of complication, not stated as uncontrolled   . Vitamin D deficiency     Past Surgical History:  Procedure Laterality Date  . AMPUTATION Right 09/01/2019   Procedure: RIGHT BELOW KNEE AMPUTATION;  Surgeon: Newt Minion, MD;  Location: [place];  Service: Orthopedics;  Laterality: Right;  . CHOLECYSTECTOMY  1987  . COLONOSCOPY    . I & D EXTREMITY Right 08/20/2019   Procedure: IRRIGATION & DEBRIDEMENT RIGHT FOOT PARTIAL CALCANEAL EXCISION;  Surgeon: Newt Minion, MD;  Location: [place];  Service: Orthopedics;  Laterality: Right;  . I & D EXTREMITY Right 08/27/2019   Procedure: IRRIGATION AND DEBRIDEMENT  OF FOOT;  Surgeon: Leandrew Koyanagi, MD;  Location: [place];  Service: Orthopedics;  Laterality: Right;  . ORIF TIBIA FRACTURE     x 3 right leg    There were no vitals filed for this  visit.   Subjective Assessment - 12/29/19 1015    Subjective He needs new shrinkers for night time.    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 No/denies              [facility] PT Assessment - 12/29/19 1015      Standardized Balance Assessment   Standardized Balance Assessment Mini-BESTest                         [facility] Adult PT Treatment/Exercise - 12/29/19 1015      Transfers   Transfers Sit to Stand;Stand to Sit;Floor to Transfer    Sit to Stand 5: Supervision;With upper extremity assist;With armrests;From chair/3-in-1;Other (comment)   requires RW support to stabilize   Stand to Sit 5: Supervision;With upper extremity assist;With armrests;To chair/3-in-1;Other (comment)   requires RW for stability   Floor to Transfer 5: Supervision;With upper extremity assist   pushing off mat table     Ambulation/Gait   Ambulation/Gait Yes    Ambulation/Gait Assistance 5: Supervision  Ambulation Distance (Feet) 100 Feet   100'+ with RW & 100' X 2 w/cane   Assistive device Prosthesis;Straight cane;None   entered w/cane, ambulated in clinic no device   Ramp 5: Supervision   prosthesis only   Curb 5: Supervision   prosthesis     High Level Balance   High Level Balance Activities Side stepping;Backward walking;Other (comment);Turns   walking with eyes closed to simulate walking in dark.    High Level Balance Comments tactile & verbal cues on technique      Therapeutic Activites    Lifting PT demo & verbal cues on technique to lift 18# boxes from floor with TTA prosthesis. Pt return demo 2 reps with min guard.       Knee/Hip Exercises: Stretches   Passive Hamstring Stretch Right;Left;20 seconds;2 reps    Passive Hamstring Stretch Limitations LE  extended on leg press plate with strap to DF foot      Knee/Hip Exercises: Aerobic   Nustep --      Knee/Hip Exercises: Machines for Strengthening   Cybex Leg Press 137# shuttle leg press back flat BLEs 15 reps 1 sets Knee stabilization in ext lifting LLE off plate for 3 seconds;  RLE only 56# 15 reps 1 sets      Knee/Hip Exercises: Seated   Sit to Sand 10 reps;without UE support   from 24" bar stool     Prosthetics   Prosthetic Care Comments  current limb circumference 12.75" calf & 15.25" thigh indicates Large Number III size shrinker. He reports current is XXL.  how to determine need for shrinker at night (>1 ply change to morning fit & no phantom pain).  PT explained residual limb piain vs phantom sensation vs phantom pain.     Current prosthetic wear tolerance (days/week)  daily    Current prosthetic wear tolerance (#hours/day)  most of awake hours    Current prosthetic weight-bearing tolerance (hours/day)  Patient tolerates standing 10 min with partial weight on prosthesis without pain or limb discomfort    Edema pitting edema    Residual limb condition  no open areas, cylinderical shape, good hair growth, normal color & temperature, sweaty skin    Education Provided Other (comment)   prosthetic care comments   Person(s) Educated Patient    Education Method Explanation;Verbal cues    Education Method Verbalized understanding;Verbal cues required;Needs further instruction               Balance Exercises - 12/29/19 1015      Balance Exercises: Standing   Standing Eyes Opened Wide ([place]);Foam/compliant surface;Head turns;5 reps   head turns right/left, up/down & diagonals   Standing Eyes Opened Limitations tactile, visual & verbal cues for ankle/residual limb & hip strategies    Standing Eyes Closed Wide (BOA);Foam/compliant surface;2 reps;30 secs    Standing Eyes Closed Limitations tactile / manual cues for balance reactions    Tandem Stance Eyes open;Foam/compliant  surface;Intermittent upper extremity support;2 reps   60 sec prosthesis forward & prostheis back   Stepping Strategy Anterior;Posterior;Foam/compliant surface;5 reps    Stepping Strategy Limitations anticipatory & reactionary stepping off foam beam & stabilizing without UE support. required intermittent touch decreasing with practice.     Rockerboard Anterior/posterior;Lateral;EO;10 reps;Intermittent UE support   round board, circles CW & CCW   Rockerboard Limitations wt shifts w/hips & knees, stop motion & recover    Step Ups Forward;UE support 2   BOSU round side up   Step Ups Limitations step  up & step down 10 reps    Other Standing Exercises BOSU round side up Y reaches BUE support 5 reps.                PT Short Term Goals - 12/20/19 1022      PT SHORT TERM GOAL #1   Title Patient demonstrates understanding of donning prosthesis, cleaning & adjusting ply socks. (All STGs Target Date: 12/15/2019)    Baseline MET 12/20/2019    Time 4    Period Weeks    Status Achieved    Target Date 12/15/19      PT SHORT TERM GOAL #2   Title Patient tolerates prosthesis wear >12hrs/ day without skin issues.    Baseline MET 12/20/2019    Time 4    Period Weeks    Status Achieved    Target Date 12/15/19      PT SHORT TERM GOAL #3   Title Patient able to stand 60sec without UE support and pick up item from floor with supervision.    Baseline MET 12/20/2019    Time 4    Period Weeks    Status Achieved    Target Date 12/15/19      PT SHORT TERM GOAL #4   Title Patient ambulates 250' with cane & prosthesis with supervision.    Baseline MET 12/20/2019    Time 4    Period Weeks    Status Achieved    Target Date 12/15/19      PT SHORT TERM GOAL #5   Title Patient negotiates stairs with single rail, ramps & curbs with cane & prosthesis with minA.    Baseline MET 12/20/2019    Time 4    Period Weeks    Status Achieved    Target Date 12/15/19             PT Long Term Goals -  12/20/19 1024      PT LONG TERM GOAL #1   Title Patient verbalizes & demonstrates understanding of prosthetic care comments to enable safe utilization of prosthesis.  (All LTGs Target Date:  01/12/2020)    Time 8    Period Weeks    Status On-going    Target Date 01/12/20      PT LONG TERM GOAL #2   Title Patient tolerates prosthesis wear >90% of awake hours without skin or residual limb pain issues.    Time 8    Period Weeks    Status On-going    Target Date 01/12/20      PT LONG TERM GOAL #3   Title Berg Balance >/=45/56    Time 8    Period Weeks    Status On-going    Target Date 01/12/20      PT LONG TERM GOAL #4   Title Patient ambulates >500' including grass with cane or less and prosthesis modified independent.    Time 8    Period Weeks    Status On-going    Target Date 01/12/20      PT LONG TERM GOAL #5   Title Patient negotiates ramps, curbs & stairs with cane or less and prosthesis modified independent.    Time 8    Period Weeks    Status On-going    Target Date 01/12/20      PT LONG TERM GOAL #6   Title Patient able to lift & carry 20# box and push / pull activities engaging prosthesis with proper technique.  Time 8    Period Weeks    Status On-going    Target Date 01/12/20                 Plan - 12/29/19 1015    Clinical Impression Statement Patient is on target to meet LTGs. PT measured his residual limb and appears that he needs size large shrinker (current are XXL).    Personal Factors and Comorbidities Comorbidity 3+;Past/Current Experience    Comorbidities TTA, DM2, HTN, Hep C,HLD,    Examination-Activity Limitations Carry;Lift;Locomotion Level;Reach Overhead;Squat;Stairs;Stand;Transfers    Examination-Participation Restrictions Community Activity;Driving;Occupation    Stability/Clinical Decision Making Evolving/Moderate complexity    Rehab Potential Good    PT Frequency 2x / week    PT Duration 8 weeks    PT Treatment/Interventions  ADLs/Self Care Home Management;DME Instruction;Gait training;Stair training;Functional mobility training;Therapeutic activities;Therapeutic exercise;Balance training;Neuromuscular re-education;Patient/family education;Prosthetic Training;Scar mobilization;Vestibular    PT Next Visit Plan review prosthetic care, prosthetic gait including ramps & curbs with cane. therapeutic exercise    Consulted and Agree with Plan of Care Patient           Patient will benefit from skilled therapeutic intervention in order to improve the following deficits and impairments:  Abnormal gait, Decreased activity tolerance, Decreased balance, Decreased endurance, Decreased knowledge of use of DME, Decreased mobility, Decreased skin integrity, Decreased strength, Dizziness, Increased edema, Postural dysfunction, Prosthetic Dependency  Visit Diagnosis: Unsteadiness on feet  Other abnormalities of gait and mobility  Muscle weakness (generalized)  Abnormal posture     Problem List Patient Active Problem List   Diagnosis Date Noted  . Right below-knee amputee ([place]) 09/03/2019  . Hyponatremia 08/26/2019  . Cutaneous abscess of right foot   . Poorly controlled diabetes mellitus ([place]) 07/16/2019  . FHx: heart disease 10/08/2017  . Type 2 diabetes mellitus with stage 2 chronic kidney disease, without long-term current use of insulin ([place]) 10/08/2017  . Insomnia 07/08/2017  . Smoker 08/17/2015  . COPD (chronic obstructive pulmonary disease) ([place]) 08/17/2015  . 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 CKD stage 2 due to type 2 diabetes mellitus ([place]) 08/31/2013  . Medication management 05/19/2013  . Morbid obesity (BMI 35) 05/19/2013  . Essential hypertension   . Hyperlipidemia associated with type 2 diabetes mellitus ([place])   . Vitamin D deficiency   . History of kidney stones   . Testosterone deficiency   . History of hepatitis C     Jamey Reas PT, DPT 12/29/2019, 12:44 PM  [facility] Physical Therapy [address] [place], [place], [address] Phone: [phone]   Fax:  [phone]  Name: DONTEZ HAUSS MRN: [id number] Date of Birth: 1960-11-20

## 2019-12-29 NOTE — Telephone Encounter (Signed)
Rx is ready at the front desk.

## 2019-12-29 NOTE — Telephone Encounter (Signed)
[name] residual limb has shrunk as typical with prosthesis wear. He needs & wants new shrinkers / Vivewear compression prosthetic socks. His measurements indicate large / number III size. He reports his current ones are XXL which is probably why not fitting now.  Can you please write a prescription for [facility] for more shrinkers size large. He is in PT until 1100 Thank you Zella Ball

## 2020-01-03 ENCOUNTER — Ambulatory Visit: Payer: BC Managed Care – PPO | Admitting: Physical Therapy

## 2020-01-03 ENCOUNTER — Other Ambulatory Visit: Payer: Self-pay

## 2020-01-03 ENCOUNTER — Encounter: Payer: Self-pay | Admitting: Physical Therapy

## 2020-01-03 DIAGNOSIS — R2681 Unsteadiness on feet: Secondary | ICD-10-CM

## 2020-01-03 DIAGNOSIS — M6281 Muscle weakness (generalized): Secondary | ICD-10-CM

## 2020-01-03 DIAGNOSIS — R293 Abnormal posture: Secondary | ICD-10-CM

## 2020-01-03 DIAGNOSIS — R2689 Other abnormalities of gait and mobility: Secondary | ICD-10-CM | POA: Diagnosis not present

## 2020-01-03 NOTE — Therapy (Signed)
[facility] Physical Therapy [address] [place], [place], [address] Phone: [phone]   Fax:  [phone]  Physical Therapy Treatment  Patient Details  Name: [name] MRN: [id number] Date of Birth: [birth year] Referring Provider (PT): [name] Persons, [place]   Encounter Date: 01/03/2020   PT End of Session - 01/03/20 1022    Visit Number 12    Number of Visits 17    Authorization Type BCBS comm    Authorization Time Period $35 co-pay    PT Start Time 5462    PT Stop Time 1100    PT Time Calculation (min) 45 min    Equipment Utilized During Treatment Gait belt    Activity Tolerance Patient tolerated treatment well    Behavior During Therapy WFL for tasks assessed/performed           Past Medical History:  Diagnosis Date  . Diabetic neuropathy ([place])   . Diverticulitis   . History of hepatitis C    has been treated in the past  . History of kidney stones   . Hyperlipidemia   . Hypertension   . 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[place])   . Other testicular hypofunction   . Severe sepsis ([place]) 08/26/2019  . Type II or unspecified type diabetes mellitus without mention of complication, not stated as uncontrolled   . Vitamin D deficiency     Past Surgical History:  Procedure Laterality Date  . AMPUTATION Right 09/01/2019   Procedure: RIGHT BELOW KNEE AMPUTATION;  Surgeon: Newt Minion, MD;  Location: [place];  Service: Orthopedics;  Laterality: Right;  . CHOLECYSTECTOMY  1987  . COLONOSCOPY    . I & D EXTREMITY Right 08/20/2019   Procedure: IRRIGATION & DEBRIDEMENT RIGHT FOOT PARTIAL CALCANEAL EXCISION;  Surgeon: Newt Minion, MD;  Location: [place];  Service: Orthopedics;  Laterality: Right;  . I & D EXTREMITY Right 08/27/2019   Procedure: IRRIGATION AND DEBRIDEMENT  OF FOOT;  Surgeon: Leandrew Koyanagi, MD;  Location: [place];  Service: Orthopedics;  Laterality: Right;  . ORIF TIBIA FRACTURE     x 3 right leg    There were no vitals filed for this  visit.   Subjective Assessment - 01/03/20 1018    Subjective No issues with return to work.  His prosthesis is better since he backed off on socks as PT recommended. He also started using tie on shoes instead of slip ons.    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 No/denies                             [facility] Adult PT Treatment/Exercise - 01/03/20 1015      Transfers   Transfers Sit to Stand;Stand to Sit;Floor to Transfer    Sit to Stand 5: Supervision;Without upper extremity assist;From chair/3-in-1    Stand to Sit 5: Supervision;To chair/3-in-1;Without upper extremity assist    Floor to Transfer --      Ambulation/Gait   Ambulation/Gait Yes    Ambulation/Gait Assistance 5: Supervision    Ambulation Distance (Feet) 100 Feet   100'+ with RW & 100' X 2 w/cane   Assistive device Prosthesis;None    Ramp 5: Supervision  prosthesis only   Curb 5: Supervision   prosthesis     High Level Balance   High Level Balance Activities Side stepping;Backward walking;Other (comment);Turns   walking with eyes closed to simulate walking in dark.    High Level Balance Comments tactile & verbal cues on technique      Therapeutic Activites    Lifting PT verbal cues on technique to lift 18# boxes from floor with TTA prosthesis. Pt return demo 2 reps with supervision      Knee/Hip Exercises: Stretches   Passive Hamstring Stretch Right;Left;20 seconds;2 reps    Passive Hamstring Stretch Limitations LE extended on leg press plate with strap to DF foot      Knee/Hip Exercises: Aerobic   Tread Mill used treadmill to work on walking uphill 1 min & downhill 1 min 1.0 mph 3.0-7.0 incline    Nustep level 10 with BLEs & BUEs 5 min warm-up while PT did subjective       Knee/Hip Exercises: Machines for Strengthening   Cybex Leg Press 137# shuttle leg press back flat BLEs 15 reps 1 sets Knee stabilization in ext lifting LLE off plate for 3 seconds;  RLE only 62# 15 reps 1 sets      Knee/Hip Exercises: Seated   Sit to Sand --      Prosthetics   Prosthetic Care Comments  If ever gets wound or blister on limb, can wear cutoff Vive wear shrinker under liner until it heals.    Current prosthetic wear tolerance (days/week)  daily    Current prosthetic wear tolerance (#hours/day)  most of awake hours    Current prosthetic weight-bearing tolerance (hours/day)  Patient tolerates standing 10 min with partial weight on prosthesis without pain or limb discomfort    Edema pitting edema    Residual limb condition  no open areas, cylinderical shape, good hair growth, normal color & temperature, sweaty skin    Education Provided Other (comment)   see prosthetic care comments.   Person(s) Educated Patient    Education Method Explanation;Verbal cues    Education Method Verbalized understanding                    PT Short Term Goals - 12/20/19 1022      PT SHORT TERM GOAL #1   Title Patient demonstrates understanding of donning prosthesis, cleaning & adjusting ply socks. (All STGs Target Date: 12/15/2019)    Baseline MET 12/20/2019    Time 4    Period Weeks    Status Achieved    Target Date 12/15/19      PT SHORT TERM GOAL #2   Title Patient tolerates prosthesis wear >12hrs/ day without skin issues.    Baseline MET 12/20/2019    Time 4    Period Weeks    Status Achieved    Target Date 12/15/19      PT SHORT TERM GOAL #3   Title Patient able to stand 60sec without UE support and pick up item from floor with supervision.    Baseline MET 12/20/2019    Time 4    Period Weeks    Status Achieved    Target Date 12/15/19      PT SHORT TERM GOAL #4   Title Patient ambulates 250' with cane & prosthesis with supervision.    Baseline MET 12/20/2019     Time 4    Period Weeks    Status Achieved    Target Date 12/15/19  PT SHORT TERM GOAL #5   Title Patient negotiates stairs with single rail, ramps & curbs with cane & prosthesis with minA.    Baseline MET 12/20/2019    Time 4    Period Weeks    Status Achieved    Target Date 12/15/19             PT Long Term Goals - 12/20/19 1024      PT LONG TERM GOAL #1   Title Patient verbalizes & demonstrates understanding of prosthetic care comments to enable safe utilization of prosthesis.  (All LTGs Target Date:  01/12/2020)    Time 8    Period Weeks    Status On-going    Target Date 01/12/20      PT LONG TERM GOAL #2   Title Patient tolerates prosthesis wear >90% of awake hours without skin or residual limb pain issues.    Time 8    Period Weeks    Status On-going    Target Date 01/12/20      PT LONG TERM GOAL #3   Title Berg Balance >/=45/56    Time 8    Period Weeks    Status On-going    Target Date 01/12/20      PT LONG TERM GOAL #4   Title Patient ambulates >500' including grass with cane or less and prosthesis modified independent.    Time 8    Period Weeks    Status On-going    Target Date 01/12/20      PT LONG TERM GOAL #5   Title Patient negotiates ramps, curbs & stairs with cane or less and prosthesis modified independent.    Time 8    Period Weeks    Status On-going    Target Date 01/12/20      PT LONG TERM GOAL #6   Title Patient able to lift & carry 20# box and push / pull activities engaging prosthesis with proper technique.    Time 8    Period Weeks    Status On-going    Target Date 01/12/20                 Plan - 01/03/20 1023    Clinical Impression Statement Patient is on target to meet LTGs next week.  His balance reactions continue to improve.  PT used treadmill to work on uphill & downhill gait but he is fearful of treadmill.    Personal Factors and Comorbidities Comorbidity 3+;Past/Current Experience    Comorbidities TTA,  DM2, HTN, Hep C,HLD,    Examination-Activity Limitations Carry;Lift;Locomotion Level;Reach Overhead;Squat;Stairs;Stand;Transfers    Examination-Participation Restrictions Community Activity;Driving;Occupation    Stability/Clinical Decision Making Evolving/Moderate complexity    Rehab Potential Good    PT Frequency 2x / week    PT Duration 8 weeks    PT Treatment/Interventions ADLs/Self Care Home Management;DME Instruction;Gait training;Stair training;Functional mobility training;Therapeutic activities;Therapeutic exercise;Balance training;Neuromuscular re-education;Patient/family education;Prosthetic Training;Scar mobilization;Vestibular    PT Next Visit Plan review prosthetic care, prosthetic gait including ramps & curbs with cane. therapeutic exercise, balance activities    Consulted and Agree with Plan of Care Patient           Patient will benefit from skilled therapeutic intervention in order to improve the following deficits and impairments:  Abnormal gait,Decreased activity tolerance,Decreased balance,Decreased endurance,Decreased knowledge of use of DME,Decreased mobility,Decreased skin integrity,Decreased strength,Dizziness,Increased edema,Postural dysfunction,Prosthetic Dependency  Visit Diagnosis: Unsteadiness on feet  Other abnormalities of gait and mobility  Muscle weakness (generalized)  Abnormal posture  Problem List Patient Active Problem List   Diagnosis Date Noted  . Right below-knee amputee ([place]) 09/03/2019  . Hyponatremia 08/26/2019  . Cutaneous abscess of right foot   . Poorly controlled diabetes mellitus ([place]) 07/16/2019  . FHx: heart disease 10/08/2017  . Type 2 diabetes mellitus with stage 2 chronic kidney disease, without long-term current use of insulin ([place]) 10/08/2017  . Insomnia 07/08/2017  . Smoker 08/17/2015  . COPD (chronic obstructive pulmonary disease) ([place]) 08/17/2015  . 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 CKD stage 2 due to  type 2 diabetes mellitus ([place]) 08/31/2013  . Medication management 05/19/2013  . Morbid obesity (BMI 35) 05/19/2013  . Essential hypertension   . Hyperlipidemia associated with type 2 diabetes mellitus ([place])   . Vitamin D deficiency   . History of kidney stones   . Testosterone deficiency   . History of hepatitis C     Jamey Reas  PT, DPT 01/03/2020, 1:21 PM  [facility] Physical Therapy [address] [place], [place], [address] Phone: [phone]   Fax:  [phone]  Name: SIRE POET MRN: [id number] Date of Birth: [birth year]

## 2020-01-05 ENCOUNTER — Other Ambulatory Visit: Payer: Self-pay

## 2020-01-05 ENCOUNTER — Encounter: Payer: Self-pay | Admitting: Rehabilitation

## 2020-01-05 ENCOUNTER — Ambulatory Visit: Payer: BC Managed Care – PPO | Admitting: Rehabilitation

## 2020-01-05 DIAGNOSIS — R2689 Other abnormalities of gait and mobility: Secondary | ICD-10-CM | POA: Diagnosis not present

## 2020-01-05 DIAGNOSIS — R293 Abnormal posture: Secondary | ICD-10-CM

## 2020-01-05 DIAGNOSIS — R2681 Unsteadiness on feet: Secondary | ICD-10-CM | POA: Diagnosis not present

## 2020-01-05 DIAGNOSIS — M6281 Muscle weakness (generalized): Secondary | ICD-10-CM | POA: Diagnosis not present

## 2020-01-05 NOTE — Therapy (Signed)
[facility] Physical Therapy [address] [place], [place], [address] Phone: [phone]   Fax:  [phone]  Physical Therapy Treatment  Patient Details  Name: [name] MRN: [id number] Date of Birth: 1960/08/27 Referring Provider (PT): [name] Persons, [place]   Encounter Date: 01/05/2020   PT End of Session - 01/05/20 1228    Visit Number 13    Number of Visits 17    Authorization Type BCBS comm    Authorization Time Period $35 co-pay    PT Start Time 1016    PT Stop Time 1100    PT Time Calculation (min) 44 min    Equipment Utilized During Treatment Gait belt    Activity Tolerance Patient tolerated treatment well    Behavior During Therapy WFL for tasks assessed/performed           Past Medical History:  Diagnosis Date   Diabetic neuropathy ([place])    Diverticulitis    History of hepatitis C    has been treated in the past   History of kidney stones    Hyperlipidemia    Hypertension    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[place])    Other testicular hypofunction    Severe sepsis ([place]) 08/26/2019   Type II or unspecified type diabetes mellitus without mention of complication, not stated as uncontrolled    Vitamin D deficiency     Past Surgical History:  Procedure Laterality Date   AMPUTATION Right 09/01/2019   Procedure: RIGHT BELOW KNEE AMPUTATION;  Surgeon: Newt Minion, MD;  Location: [place];  Service: Orthopedics;  Laterality: Right;   CHOLECYSTECTOMY  1987   COLONOSCOPY     I & D EXTREMITY Right 08/20/2019   Procedure: IRRIGATION & DEBRIDEMENT RIGHT FOOT PARTIAL CALCANEAL EXCISION;  Surgeon: Newt Minion, MD;  Location: [place];  Service: Orthopedics;  Laterality: Right;   I & D EXTREMITY Right 08/27/2019   Procedure: IRRIGATION AND DEBRIDEMENT  OF FOOT;  Surgeon: Leandrew Koyanagi, MD;  Location: [place];  Service: Orthopedics;  Laterality: Right;   ORIF TIBIA FRACTURE     x 3 right leg    There were no vitals filed for this  visit.   Subjective Assessment - 01/05/20 1021    Subjective Pt cotinues to do well at work, no issues.    Pertinent History TTA, DM2, HTN, Hep C,HLD,    Limitations Lifting;Standing;Walking;House hold activities    Currently in Pain? No/denies                             [facility] Adult PT Treatment/Exercise - 01/05/20 1028      Transfers   Transfers Sit to Stand;Stand to Sit    Sit to Stand 6: Modified independent (Device/Increase time)    Stand to Sit 5: Supervision;To chair/3-in-1;Without upper extremity assist    Stand to Sit Details Min cues to not push against chair when sitting    Comments Sit to stand x 10 reps with emphasis on slow controlled descent.  Pt did very well.      Ambulation/Gait   Ambulation/Gait Yes    Ambulation/Gait Assistance 5: Supervision    Ambulation/Gait Assistance Details S for gait with balance challenges:  Changing gait speed, sudden stops, backwards gait, gait with head turns, and direction changes.  Pt with most difficulty during backwards gait.  Pt with single slight LOB but able to self correct.Did simulate outdoor unevel surfaces with yoga mat  and placing ankle weights under mat.  Pt able to do at S level, no overt LOB noted.    Ambulation Distance (Feet) 400 Feet    Assistive device Prosthesis;None    Gait Pattern Step-to pattern;Decreased step length - left;Decreased stance time - right;Decreased stride length;Decreased hip/knee flexion - right;Decreased weight shift to right;Right hip hike;Right circumduction;Antalgic;Lateral hip instability;Trunk flexed;Abducted- right    Ambulation Surface Level;Indoor;Outdoor;Unlevel   simulated outdoor     Standardized Balance Assessment   Standardized Balance Assessment Berg Balance Test      Berg Balance Test   Sit to Stand Able to stand without using hands and stabilize independently    Standing Unsupported Able to stand safely 2 minutes    Sitting with Back Unsupported but Feet  Supported on Floor or Stool Able to sit safely and securely 2 minutes    Stand to Sit Sits safely with minimal use of hands    Transfers Able to transfer safely, minor use of hands    Standing Unsupported with Eyes Closed Able to stand 10 seconds with supervision    Standing Ubsupported with Feet Together Able to place feet together independently and stand 1 minute safely    From Standing, Reach Forward with Outstretched Arm Can reach confidently >25 cm (10")    From Standing Position, Pick up Object from Floor Able to pick up shoe safely and easily    From Standing Position, Turn to Look Behind Over each Shoulder Looks behind one side only/other side shows less weight shift    Turn 360 Degrees Able to turn 360 degrees safely but slowly   5 secs B   Standing Unsupported, Alternately Place Feet on Step/Stool Able to complete 4 steps without aid or supervision    Standing Unsupported, One Foot in Front Able to place foot tandem independently and hold 30 seconds    Standing on One Leg Tries to lift leg/unable to hold 3 seconds but remains standing independently    Total Score 47      High Level Balance   High Level Balance Activities Backward walking;Direction changes;Turns;Sudden stops;Head turns   see details under gait   High Level Balance Comments In // bars on foam airex:  Standing hip abd x 10 reps on each side with light UE support, lateral step offs x 10 reps each side with minimal UE support.  Feet apart on airex tapping cones alternating LEs x 10 reps, feet apart EO with head turns side/side x 10 reps, up and down x 10 reps.  Intermittent UE support.  Pt continues to have moderate sway with vertical head motion throughout.      Exercises   Exercises Other Exercises    Other Exercises  Standing hip abd while in foam airex x 10 reps on each side with intermittent UE support as needed.      Prosthetics   Prosthetic Care Comments  Discussed changing shoes and the possible need for  adjustment if changing sole/heel height    Current prosthetic wear tolerance (days/week)  daily    Current prosthetic wear tolerance (#hours/day)  most of awake hours    Current prosthetic weight-bearing tolerance (hours/day)  Patient tolerates standing 10 min with partial weight on prosthesis without pain or limb discomfort    Residual limb condition  no issues per pt report    Education Provided Other (comment)    Person(s) Educated Patient    Education Method Explanation    Education Method Verbalized understanding  Donning Prosthesis Modified independent (device/increased time)    Doffing Prosthesis Modified independent (device/increased time)                    PT Short Term Goals - 12/20/19 1022      PT SHORT TERM GOAL #1   Title Patient demonstrates understanding of donning prosthesis, cleaning & adjusting ply socks. (All STGs Target Date: 12/15/2019)    Baseline MET 12/20/2019    Time 4    Period Weeks    Status Achieved    Target Date 12/15/19      PT SHORT TERM GOAL #2   Title Patient tolerates prosthesis wear >12hrs/ day without skin issues.    Baseline MET 12/20/2019    Time 4    Period Weeks    Status Achieved    Target Date 12/15/19      PT SHORT TERM GOAL #3   Title Patient able to stand 60sec without UE support and pick up item from floor with supervision.    Baseline MET 12/20/2019    Time 4    Period Weeks    Status Achieved    Target Date 12/15/19      PT SHORT TERM GOAL #4   Title Patient ambulates 250' with cane & prosthesis with supervision.    Baseline MET 12/20/2019    Time 4    Period Weeks    Status Achieved    Target Date 12/15/19      PT SHORT TERM GOAL #5   Title Patient negotiates stairs with single rail, ramps & curbs with cane & prosthesis with minA.    Baseline MET 12/20/2019    Time 4    Period Weeks    Status Achieved    Target Date 12/15/19             PT Long Term Goals - 01/05/20 1026      PT LONG TERM  GOAL #1   Title Patient verbalizes & demonstrates understanding of prosthetic care comments to enable safe utilization of prosthesis.  (All LTGs Target Date:  01/12/2020)    Time 8    Period Weeks    Status On-going      PT LONG TERM GOAL #2   Title Patient tolerates prosthesis wear >90% of awake hours without skin or residual limb pain issues.    Time 8    Period Weeks    Status On-going      PT LONG TERM GOAL #3   Title Berg Balance >/=45/56    Baseline 47/56 on 12/15    Time 8    Period Weeks    Status Achieved      PT LONG TERM GOAL #4   Title Patient ambulates >500' including grass with cane or less and prosthesis modified independent.    Time 8    Period Weeks    Status On-going      PT LONG TERM GOAL #5   Title Patient negotiates ramps, curbs & stairs with cane or less and prosthesis modified independent.    Time 8    Period Weeks    Status On-going      PT LONG TERM GOAL #6   Title Patient able to lift & carry 20# box and push / pull activities engaging prosthesis with proper technique.    Time 8    Period Weeks    Status On-going  Plan - 01/05/20 1228    Clinical Impression Statement Skilled session focused on beginning to assess LTGs.  PT assessed BERG balance with score of 47/56, meeting LTG.  Also continue to work on high level balance, stepping strategy and gait/balance tasks.  Pt will be ready for DC next week.    Personal Factors and Comorbidities Comorbidity 3+;Past/Current Experience    Comorbidities TTA, DM2, HTN, Hep C,HLD,    Examination-Activity Limitations Carry;Lift;Locomotion Level;Reach Overhead;Squat;Stairs;Stand;Transfers    Examination-Participation Restrictions Community Activity;Driving;Occupation    Stability/Clinical Decision Making Evolving/Moderate complexity    Rehab Potential Good    PT Frequency 2x / week    PT Duration 8 weeks    PT Treatment/Interventions ADLs/Self Care Home Management;DME Instruction;Gait  training;Stair training;Functional mobility training;Therapeutic activities;Therapeutic exercise;Balance training;Neuromuscular re-education;Patient/family education;Prosthetic Training;Scar mobilization;Vestibular    PT Next Visit Plan review prosthetic care, prosthetic gait including ramps & curbs with cane. therapeutic exercise, balance activities    Consulted and Agree with Plan of Care Patient           Patient will benefit from skilled therapeutic intervention in order to improve the following deficits and impairments:  Abnormal gait,Decreased activity tolerance,Decreased balance,Decreased endurance,Decreased knowledge of use of DME,Decreased mobility,Decreased skin integrity,Decreased strength,Dizziness,Increased edema,Postural dysfunction,Prosthetic Dependency  Visit Diagnosis: Unsteadiness on feet  Other abnormalities of gait and mobility  Muscle weakness (generalized)  Abnormal posture     Problem List Patient Active Problem List   Diagnosis Date Noted   Right below-knee amputee ([place]) 09/03/2019   Hyponatremia 08/26/2019   Cutaneous abscess of right foot    Poorly controlled diabetes mellitus ([place]) 07/16/2019   FHx: heart disease 10/08/2017   Type 2 diabetes mellitus with stage 2 chronic kidney disease, without long-term current use of insulin ([place]) 10/08/2017   Insomnia 07/08/2017   Smoker 08/17/2015   COPD (chronic obstructive pulmonary disease) ([place]) 08/17/2015   T2_NIDDM w/Peripheral Neuropathy 04/01/2014   CKD stage 2 due to type 2 diabetes mellitus ([place]) 08/31/2013   Medication management 05/19/2013   Morbid obesity (BMI 35) 05/19/2013   Essential hypertension    Hyperlipidemia associated with type 2 diabetes mellitus ([place])    Vitamin D deficiency    History of kidney stones    Testosterone deficiency    History of hepatitis C     Cameron Sprang, PT, MPT [facility] [address] [place], [place], [address] Phone: [phone]   Fax:  [phone] 01/05/20, 12:32 PM  Name: JAQUALIN SERPA MRN: [id number] Date of Birth: 1960-10-13

## 2020-01-10 ENCOUNTER — Ambulatory Visit (INDEPENDENT_AMBULATORY_CARE_PROVIDER_SITE_OTHER): Payer: BC Managed Care – PPO | Admitting: Physical Therapy

## 2020-01-10 ENCOUNTER — Other Ambulatory Visit: Payer: Self-pay

## 2020-01-10 DIAGNOSIS — R2689 Other abnormalities of gait and mobility: Secondary | ICD-10-CM

## 2020-01-10 DIAGNOSIS — R293 Abnormal posture: Secondary | ICD-10-CM

## 2020-01-10 DIAGNOSIS — R2681 Unsteadiness on feet: Secondary | ICD-10-CM

## 2020-01-10 DIAGNOSIS — M6281 Muscle weakness (generalized): Secondary | ICD-10-CM

## 2020-01-10 NOTE — Therapy (Signed)
[facility] Physical Therapy [address] [place], [place], [address] Phone: [phone]   Fax:  [phone]  Physical Therapy Treatment  Patient Details  Name: [name] MRN: [id number] Date of Birth: [birth year] Referring Provider (PT): [name] Persons, [place]   Encounter Date: 01/10/2020   PT End of Session - 01/10/20 1101    Visit Number 14    Number of Visits 17    Authorization Type BCBS comm    Authorization Time Period $35 co-pay    PT Start Time 7416    PT Stop Time 1055    PT Time Calculation (min) 40 min    Equipment Utilized During Treatment Gait belt    Activity Tolerance Patient tolerated treatment well    Behavior During Therapy WFL for tasks assessed/performed           Past Medical History:  Diagnosis Date   Diabetic neuropathy ([place])    Diverticulitis    History of hepatitis C    has been treated in the past   History of kidney stones    Hyperlipidemia    Hypertension    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[place])    Other testicular hypofunction    Severe sepsis ([place]) 08/26/2019   Type II or unspecified type diabetes mellitus without mention of complication, not stated as uncontrolled    Vitamin D deficiency     Past Surgical History:  Procedure Laterality Date   AMPUTATION Right 09/01/2019   Procedure: RIGHT BELOW KNEE AMPUTATION;  Surgeon: Newt Minion, MD;  Location: [place];  Service: Orthopedics;  Laterality: Right;   CHOLECYSTECTOMY  1987   COLONOSCOPY     I & D EXTREMITY Right 08/20/2019   Procedure: IRRIGATION & DEBRIDEMENT RIGHT FOOT PARTIAL CALCANEAL EXCISION;  Surgeon: Newt Minion, MD;  Location: [place];  Service: Orthopedics;  Laterality: Right;   I & D EXTREMITY Right 08/27/2019   Procedure: IRRIGATION AND DEBRIDEMENT  OF FOOT;  Surgeon: Leandrew Koyanagi, MD;  Location: [place];  Service: Orthopedics;  Laterality: Right;   ORIF TIBIA FRACTURE     x 3 right leg    There were no vitals filed for this  visit.   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  Pt did very well.      Ambulation/Gait   Ambulation/Gait Yes    Ambulation/Gait Assistance 5: Supervision    Ambulation Distance (Feet) 400 Feet    Assistive device Prosthesis;None    Gait Pattern Step-to pattern;Decreased step length - left;Decreased stance time - right;Decreased stride length;Decreased hip/knee flexion - right;Decreased weight shift to right;Right hip hike;Right circumduction;Antalgic;Lateral hip instability;Trunk flexed;Abducted-  right    Ramp 5: Supervision    Curb 5: Supervision      High Level Balance   High Level Balance Activities --   see details under gait   High Level Balance Comments --      Neuro Re-ed    Neuro Re-ed Details  in bars: walking with head turns, head nods, march walking, retrowalking, walking on foam beam fwd and lateral.      Exercises   Exercises Other Exercises    Other Exercises  Standing hip abd while in foam airex x 10 reps on each side with intermittent UE support as needed.      Knee/Hip Exercises: Stretches   Active Hamstring Stretch Right;3 reps;30 seconds      Knee/Hip Exercises: Aerobic   Tread Mill 1.0 MPH 5 min bilat UE support and cues for larger step length      Knee/Hip Exercises:  Machines for Strengthening   Cybex Leg Press 137# shuttle leg press bilat 2X15 reps,  RLE only 62# 2X15 reps      Knee/Hip Exercises: Standing   Other Standing Knee Exercises deadlift 10 lbs from floor to waist X 10 reps, cues and demo for proper technique, did need one episode of min A for balance      Prosthetics   Prosthetic Care Comments  --    Current prosthetic wear tolerance (days/week)  --    Current prosthetic wear tolerance (#hours/day)  --    Current prosthetic weight-bearing tolerance (hours/day)  --    Residual limb condition  --    Education Provided --                    PT Short Term Goals - 12/20/19 1022      PT SHORT TERM GOAL #1   Title Patient demonstrates understanding of donning prosthesis, cleaning & adjusting ply socks. (All STGs Target Date: 12/15/2019)    Baseline MET 12/20/2019    Time 4    Period Weeks    Status Achieved    Target Date 12/15/19      PT SHORT TERM GOAL #2   Title Patient tolerates prosthesis wear >12hrs/ day without skin issues.    Baseline MET 12/20/2019    Time 4    Period Weeks    Status Achieved    Target Date 12/15/19      PT SHORT TERM GOAL #3   Title Patient able to stand 60sec without UE support and pick up item from floor with supervision.    Baseline MET 12/20/2019    Time 4    Period Weeks    Status Achieved    Target Date 12/15/19      PT SHORT TERM GOAL #4   Title Patient ambulates 250' with cane & prosthesis with supervision.    Baseline MET 12/20/2019    Time 4    Period Weeks    Status Achieved    Target Date 12/15/19      PT SHORT TERM GOAL #5   Title Patient negotiates stairs with single rail, ramps & curbs with cane & prosthesis with minA.    Baseline MET 12/20/2019    Time 4    Period Weeks    Status Achieved    Target Date 12/15/19             PT Long Term Goals - 01/05/20 1026      PT LONG TERM GOAL #1   Title Patient verbalizes &  demonstrates understanding of prosthetic care  comments to enable safe utilization of prosthesis.  (All LTGs Target Date:  01/12/2020)    Time 8    Period Weeks    Status On-going      PT LONG TERM GOAL #2   Title Patient tolerates prosthesis wear >90% of awake hours without skin or residual limb pain issues.    Time 8    Period Weeks    Status On-going      PT LONG TERM GOAL #3   Title Berg Balance >/=45/56    Baseline 47/56 on 12/15    Time 8    Period Weeks    Status Achieved      PT LONG TERM GOAL #4   Title Patient ambulates >500' including grass with cane or less and prosthesis modified independent.    Time 8    Period Weeks    Status On-going      PT LONG TERM GOAL #5   Title Patient negotiates ramps, curbs & stairs with cane or less and prosthesis modified independent.    Time 8    Period Weeks    Status On-going      PT LONG TERM GOAL #6   Title Patient able to lift & carry 20# box and push / pull activities engaging prosthesis with proper technique.    Time 8    Period Weeks    Status On-going                 Plan - 01/10/20 1104    Clinical Impression Statement Session worked on balance, leg strength, and functional lifting. Overall he has made great progress with PT and he feels ready to discharge next visit    Personal Factors and Comorbidities Comorbidity 3+;Past/Current Experience    Comorbidities TTA, DM2, HTN, Hep C,HLD,    Examination-Activity Limitations Carry;Lift;Locomotion Level;Reach Overhead;Squat;Stairs;Stand;Transfers    Examination-Participation Restrictions Community Activity;Driving;Occupation    Stability/Clinical Decision Making Evolving/Moderate complexity    Rehab Potential Good    PT Frequency 2x / week    PT Duration 8 weeks    PT Treatment/Interventions ADLs/Self Care Home Management;DME Instruction;Gait training;Stair training;Functional mobility training;Therapeutic activities;Therapeutic exercise;Balance training;Neuromuscular re-education;Patient/family  education;Prosthetic Training;Scar mobilization;Vestibular    PT Next Visit Plan DC    Consulted and Agree with Plan of Care Patient           Patient will benefit from skilled therapeutic intervention in order to improve the following deficits and impairments:  Abnormal gait,Decreased activity tolerance,Decreased balance,Decreased endurance,Decreased knowledge of use of DME,Decreased mobility,Decreased skin integrity,Decreased strength,Dizziness,Increased edema,Postural dysfunction,Prosthetic Dependency  Visit Diagnosis: Unsteadiness on feet  Other abnormalities of gait and mobility  Muscle weakness (generalized)  Abnormal posture     Problem List Patient Active Problem List   Diagnosis Date Noted   Right below-knee amputee ([place]) 09/03/2019   Hyponatremia 08/26/2019   Cutaneous abscess of right foot    Poorly controlled diabetes mellitus ([place]) 07/16/2019   FHx: heart disease 10/08/2017   Type 2 diabetes mellitus with stage 2 chronic kidney disease, without long-term current use of insulin ([place]) 10/08/2017   Insomnia 07/08/2017   Smoker 08/17/2015   COPD (chronic obstructive pulmonary disease) ([place]) 08/17/2015   T2_NIDDM w/Peripheral Neuropathy 04/01/2014   CKD stage 2 due to type 2 diabetes mellitus ([place]) 08/31/2013   Medication management 05/19/2013   Morbid obesity (BMI 35) 05/19/2013   Essential hypertension    Hyperlipidemia associated with type 2 diabetes mellitus ([place])    Vitamin  D deficiency    History of kidney stones    Testosterone deficiency    History of hepatitis Loletha Grayer     Silvestre Mesi 01/10/2020, 11:05 AM  [facility] Physical Therapy [address] [place], [place], [address] Phone: [phone]   Fax:  [phone]  Name: ARCHIMEDES HAROLD MRN: [id number] Date of Birth: 1960-05-03

## 2020-01-12 ENCOUNTER — Other Ambulatory Visit: Payer: Self-pay

## 2020-01-12 ENCOUNTER — Encounter: Payer: Self-pay | Admitting: Physical Therapy

## 2020-01-12 ENCOUNTER — Ambulatory Visit: Payer: BC Managed Care – PPO | Admitting: Physical Therapy

## 2020-01-12 DIAGNOSIS — R2689 Other abnormalities of gait and mobility: Secondary | ICD-10-CM | POA: Diagnosis not present

## 2020-01-12 DIAGNOSIS — R293 Abnormal posture: Secondary | ICD-10-CM | POA: Diagnosis not present

## 2020-01-12 DIAGNOSIS — R2681 Unsteadiness on feet: Secondary | ICD-10-CM | POA: Diagnosis not present

## 2020-01-12 DIAGNOSIS — M6281 Muscle weakness (generalized): Secondary | ICD-10-CM | POA: Diagnosis not present

## 2020-01-12 NOTE — Therapy (Signed)
[facility] Physical Therapy [address] [place], [place], [address] Phone: [phone]   Fax:  [phone]  Physical Therapy Treatment & Discharge Summary   Patient Details  Name: [name] MRN: [id number] Date of Birth: 1961-01-10 Referring Provider (PT): [name] Persons, [place]   Encounter Date: 01/12/2020   PHYSICAL THERAPY DISCHARGE SUMMARY  Visits from Start of Care: 15  Current functional level related to goals / functional outcomes: See below   Remaining deficits: See below   Education / Equipment: Prosthetic care & HEP  Plan: Patient agrees to discharge.  Patient goals were partially met. Patient is being discharged due to meeting the stated rehab goals.  ?????           PT End of Session - 01/12/20 1017    Visit Number 15    Number of Visits 17    Authorization Type BCBS comm    Authorization Time Period $35 co-pay    PT Start Time 6433    PT Stop Time 1043    PT Time Calculation (min) 28 min    Equipment Utilized During Treatment Gait belt    Activity Tolerance Patient tolerated treatment well    Behavior During Therapy WFL for tasks assessed/performed           Past Medical History:  Diagnosis Date  . Diabetic neuropathy ([place])   . Diverticulitis   . History of hepatitis C    has been treated in the past  . History of kidney stones   . Hyperlipidemia   . Hypertension   . 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[place])   . Other testicular hypofunction   . Severe sepsis ([place]) 08/26/2019  . Type II or unspecified type diabetes mellitus without mention of complication, not stated as uncontrolled   . Vitamin D deficiency     Past Surgical History:  Procedure Laterality Date  . AMPUTATION Right 09/01/2019   Procedure: RIGHT BELOW KNEE AMPUTATION;  Surgeon: Newt Minion, MD;  Location: [place];  Service: Orthopedics;  Laterality: Right;  . CHOLECYSTECTOMY  1987  . COLONOSCOPY    . I & D EXTREMITY Right 08/20/2019   Procedure:  IRRIGATION & DEBRIDEMENT RIGHT FOOT PARTIAL CALCANEAL EXCISION;  Surgeon: Newt Minion, MD;  Location: [place];  Service: Orthopedics;  Laterality: Right;  . I & D EXTREMITY Right 08/27/2019   Procedure: IRRIGATION AND DEBRIDEMENT  OF FOOT;  Surgeon: Leandrew Koyanagi, MD;  Location: [place];  Service: Orthopedics;  Laterality: Right;  . ORIF TIBIA FRACTURE     x 3 right leg    There were no vitals filed for this visit.   Subjective Assessment - 01/12/20 1015    Subjective He has noticed that back of knee is starting to have folds when he bends his knee. He developed a small sore.    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 No/denies              [facility] PT Assessment - 01/12/20 1015      Assessment   Medical Diagnosis Right Transtibial Amputation    Referring Provider (PT) [name] Persons, PA      Ambulation/Gait   Ambulation/Gait Yes    Ambulation/Gait Assistance 6: Modified independent (  Device/Increase time)    Ambulation Distance (Feet) 500 Feet    Assistive device Prosthesis;None    Gait Pattern Step-through pattern;Decreased stance time - right;Antalgic    Ambulation Surface Level;Indoor;Outdoor;Paved    Gait velocity 3.02 ft/sec   Initial gait velocity 0.98 ft/sec   Stairs Yes    Stairs Assistance 6: Modified independent (Device/Increase time)    Stair Management Technique One rail Right;Alternating pattern;Forwards    Number of Stairs 11    Height of Stairs 6    Ramp 6: Modified independent (Device)   prosthesis only   Curb 6: Modified independent (Device/increase time)   Prosthesis only     Standardized Balance Assessment   Standardized Balance Assessment Berg Balance Test      Berg Balance Test   Sit to Stand Able to stand without using  hands and stabilize independently    Standing Unsupported Able to stand safely 2 minutes    Sitting with Back Unsupported but Feet Supported on Floor or Stool Able to sit safely and securely 2 minutes    Stand to Sit Sits safely with minimal use of hands    Transfers Able to transfer safely, minor use of hands    Standing Unsupported with Eyes Closed Able to stand 10 seconds safely    Standing Unsupported with Feet Together Able to place feet together independently but unable to hold for 30 seconds    From Standing, Reach Forward with Outstretched Arm Can reach confidently >25 cm (10")    From Standing Position, Pick up Object from Floor Able to pick up shoe safely and easily    From Standing Position, Turn to Look Behind Over each Shoulder Looks behind from both sides and weight shifts well    Turn 360 Degrees Able to turn 360 degrees safely but slowly    Standing Unsupported, Alternately Place Feet on Step/Stool Able to complete 4 steps without aid or supervision    Standing Unsupported, One Foot in Front Able to take small step independently and hold 30 seconds    Standing on One Leg Tries to lift leg/unable to hold 3 seconds but remains standing independently    Total Score 45    Berg comment: Initial Berg 16/56      Functional Gait  Assessment   Gait assessed  Yes    Gait Level Surface Walks 20 ft in less than 7 sec but greater than 5.5 sec, uses assistive device, slower speed, mild gait deviations, or deviates 6-10 in outside of the 12 in walkway width.    Change in Gait Speed Able to change speed, demonstrates mild gait deviations, deviates 6-10 in outside of the 12 in walkway width, or no gait deviations, unable to achieve a major change in velocity, or uses a change in velocity, or uses an assistive device.    Gait with Horizontal Head Turns Performs head turns smoothly with slight change in gait velocity (eg, minor disruption to smooth gait path), deviates 6-10 in outside 12 in walkway  width, or uses an assistive device.    Gait with Vertical Head Turns Performs task with slight change in gait velocity (eg, minor disruption to smooth gait path), deviates 6 - 10 in outside 12 in walkway width or uses assistive device    Gait and Pivot Turn Pivot turns safely within 3 sec and stops quickly with no loss of balance.    Step Over Obstacle Is able to step over one shoe box (4.5 in total height) without changing  gait speed. No evidence of imbalance.    Gait with Narrow Base of Support Ambulates less than 4 steps heel to toe or cannot perform without assistance.    Gait with Eyes Closed Walks 20 ft, uses assistive device, slower speed, mild gait deviations, deviates 6-10 in outside 12 in walkway width. Ambulates 20 ft in less than 9 sec but greater than 7 sec.    Ambulating Backwards Walks 20 ft, uses assistive device, slower speed, mild gait deviations, deviates 6-10 in outside 12 in walkway width.    Steps Alternating feet, must use rail.    Total Score 19           Prosthetics Assessment - 01/12/20 1015      Prosthetics   Prosthetic Care Independent with Skin check;Residual limb care;Care of non-amputated limb;Prosthetic cleaning;Ply sock cleaning;Correct ply sock adjustment;Proper wear schedule/adjustment;Proper weight-bearing schedule/adjustment    Prosthetic Care Comments  PT educated with verbal cues on using baby oil lightly over area with friction rub (popliteal medial hamstring tendon area) & speaking with prosthetist to have that relief increased. Pt verbalized understanding.    Donning prosthesis  Independent    Doffing prosthesis  Independent    Current prosthetic wear tolerance (days/week)  daily    Current prosthetic wear tolerance (#hours/day)  most of awake hours    Current prosthetic weight-bearing tolerance (hours/day)  Patient tolerates standing 15 min with partial weight on prosthesis without pain or limb discomfort    Edema none noted    Residual limb  condition  59m wound over medial hamstring with no signs of infection.    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                    PT Short Term Goals - 12/20/19 1022      PT SHORT TERM GOAL #1   Title Patient demonstrates understanding of donning prosthesis, cleaning & adjusting ply socks. (All STGs Target Date: 12/15/2019)    Baseline MET 12/20/2019    Time 4    Period Weeks    Status Achieved    Target Date 12/15/19      PT SHORT TERM GOAL #2   Title Patient tolerates prosthesis wear >12hrs/ day without skin issues.    Baseline MET 12/20/2019    Time 4    Period Weeks    Status Achieved    Target Date 12/15/19      PT SHORT TERM GOAL #3   Title Patient able to stand 60sec without UE support and pick up item from floor with supervision.    Baseline MET 12/20/2019    Time 4    Period Weeks    Status Achieved    Target Date 12/15/19      PT SHORT TERM GOAL #4   Title Patient ambulates 250' with cane & prosthesis with supervision.    Baseline MET 12/20/2019  Time 4    Period Weeks    Status Achieved    Target Date 12/15/19      PT SHORT TERM GOAL #5   Title Patient negotiates stairs with single rail, ramps & curbs with cane & prosthesis with minA.    Baseline MET 12/20/2019    Time 4    Period Weeks    Status Achieved    Target Date 12/15/19             PT Long Term Goals - 01/12/20 1048      PT LONG TERM GOAL #1   Title Patient verbalizes &  demonstrates understanding of prosthetic care comments to enable safe utilization of prosthesis.  (All LTGs Target Date:  01/12/2020)    Baseline MET 01/12/2020    Time 8    Period Weeks    Status Achieved      PT LONG TERM GOAL #2   Title Patient tolerates prosthesis wear >90% of awake hours without skin or residual limb pain issues.    Baseline Partially MET 01/12/2020 He developed small wound over medial hamstrings but verbalizes how to manage.    Time 8    Period Weeks    Status Partially Met      PT LONG TERM GOAL #3   Title Berg Balance >/=45/56    Baseline MET 01/12/2020  Merrilee Jansky Balance 45/56    Time 8    Period Weeks    Status Achieved      PT LONG TERM GOAL #4   Title Patient ambulates >500' including grass with cane or less and prosthesis modified independent.    Baseline MET 01/12/2020    Time 8    Period Weeks    Status Achieved      PT LONG TERM GOAL #5   Title Patient negotiates ramps, curbs & stairs with cane or less and prosthesis modified independent.    Baseline MET 01/12/2020    Time 8    Period Weeks    Status Achieved      PT LONG TERM GOAL #6   Title Patient able to lift & carry 20# box and push / pull activities engaging prosthesis with proper technique.    Baseline MET 01/12/2020    Time 8    Period Weeks    Status Achieved                 Plan - 01/12/20 1017    Clinical Impression Statement Patient met or partially met all LTGs. He appears to be safely functioning with prosthesis only at full community level.  He is independent in prosthetic care issues. Berg Balance 45/56 & Functional Gait Assessment 19/30 both indicate low fall risk.    Personal Factors and Comorbidities Comorbidity 3+;Past/Current Experience    Comorbidities TTA, DM2, HTN, Hep C,HLD,    Examination-Activity Limitations Carry;Lift;Locomotion Level;Reach Overhead;Squat;Stairs;Stand;Transfers    Examination-Participation Restrictions Community Activity;Driving;Occupation     Stability/Clinical Decision Making Evolving/Moderate complexity    Rehab Potential Good    PT Frequency 2x / week    PT Duration 8 weeks    PT Treatment/Interventions ADLs/Self Care Home Management;DME Instruction;Gait training;Stair training;Functional mobility training;Therapeutic activities;Therapeutic exercise;Balance training;Neuromuscular re-education;Patient/family education;Prosthetic Training;Scar mobilization;Vestibular    PT Next Visit Plan DC    Consulted and Agree with Plan of Care Patient           Patient will benefit from skilled therapeutic intervention in order to improve the following deficits and impairments:  Abnormal gait,Decreased  activity tolerance,Decreased balance,Decreased endurance,Decreased knowledge of use of DME,Decreased mobility,Decreased skin integrity,Decreased strength,Dizziness,Increased edema,Postural dysfunction,Prosthetic Dependency  Visit Diagnosis: Unsteadiness on feet  Other abnormalities of gait and mobility  Muscle weakness (generalized)  Abnormal posture     Problem List Patient Active Problem List   Diagnosis Date Noted  . Right below-knee amputee ([place]) 09/03/2019  . Hyponatremia 08/26/2019  . Cutaneous abscess of right foot   . Poorly controlled diabetes mellitus ([place]) 07/16/2019  . FHx: heart disease 10/08/2017  . Type 2 diabetes mellitus with stage 2 chronic kidney disease, without long-term current use of insulin ([place]) 10/08/2017  . Insomnia 07/08/2017  . Smoker 08/17/2015  . COPD (chronic obstructive pulmonary disease) ([place]) 08/17/2015  . 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 CKD stage 2 due to type 2 diabetes mellitus ([place]) 08/31/2013  . Medication management 05/19/2013  . Morbid obesity (BMI 35) 05/19/2013  . Essential hypertension   . Hyperlipidemia associated with type 2 diabetes mellitus ([place])   . Vitamin D deficiency   . History of kidney stones   . Testosterone deficiency   . History of hepatitis C      Jamey Reas, PT, DPT 01/12/2020, 10:53 AM  [facility] Physical Therapy [address] [place], [place], [address] Phone: [phone]   Fax:  [phone]  Name: [name] MRN: [id number] Date of Birth: 12-30-1960

## 2020-01-17 ENCOUNTER — Encounter: Payer: BC Managed Care – PPO | Admitting: Physical Therapy

## 2020-01-19 ENCOUNTER — Encounter: Payer: BC Managed Care – PPO | Admitting: Physical Therapy

## 2020-01-20 ENCOUNTER — Ambulatory Visit (INDEPENDENT_AMBULATORY_CARE_PROVIDER_SITE_OTHER): Payer: BC Managed Care – PPO | Admitting: Internal Medicine

## 2020-01-20 ENCOUNTER — Other Ambulatory Visit: Payer: Self-pay

## 2020-01-20 VITALS — BP 126/64 | HR 74 | Temp 97.3°F | Resp 16 | Ht 72.0 in | Wt 255.0 lb

## 2020-01-20 DIAGNOSIS — Z79899 Other long term (current) drug therapy: Secondary | ICD-10-CM | POA: Diagnosis not present

## 2020-01-20 DIAGNOSIS — E559 Vitamin D deficiency, unspecified: Secondary | ICD-10-CM | POA: Diagnosis not present

## 2020-01-20 DIAGNOSIS — E785 Hyperlipidemia, unspecified: Secondary | ICD-10-CM

## 2020-01-20 DIAGNOSIS — I10 Essential (primary) hypertension: Secondary | ICD-10-CM | POA: Diagnosis not present

## 2020-01-20 DIAGNOSIS — E1122 Type 2 diabetes mellitus with diabetic chronic kidney disease: Secondary | ICD-10-CM

## 2020-01-20 DIAGNOSIS — N182 Chronic kidney disease, stage 2 (mild): Secondary | ICD-10-CM

## 2020-01-20 DIAGNOSIS — E1169 Type 2 diabetes mellitus with other specified complication: Secondary | ICD-10-CM | POA: Diagnosis not present

## 2020-01-20 NOTE — Progress Notes (Signed)
History of Present Illness:       This very nice 59 y.o.male presents for 3 month follow up with HTN, HLD, Pre-Diabetes and Vitamin D Deficiency.  In Aug 2021 , patient underwent a  R t BKA.        Patient is treated for HTN & BP has been controlled at home. Today's BP is at goal - 126/64. Patient has had no complaints of any cardiac type chest pain, palpitations, dyspnea / orthopnea / PND, dizziness, claudication, or dependent edema.       Hyperlipidemia is controlled with diet & he  is reticent to take meds for cholesterol. Patient denies myalgias or other med SE's. Last Lipids were not at goal:  Lab Results  Component Value Date   CHOL 219 (H) 10/21/2019   HDL 34 (L) 10/21/2019   LDLCALC 140 (H) 10/21/2019   TRIG 292 (H) 10/21/2019   CHOLHDL 6.4 (H) 10/21/2019    Also, the patient has hx/o poorly controlled T2_NIDDM predating since 2008  w/CKD2 (GFR 87)and has had no symptoms of reactive hypoglycemia, diabetic polys, paresthesias or visual blurring.  Last A1c was Normal & at goal:  Lab Results  Component Value Date   HGBA1C 5.4 10/21/2019                                           Patient has hx/o Low Testosterone Deficiency for several years &he has declined replacement therapy.           Further, the patient also has history of Vitamin D Deficiency ("24" / 2008)  and supplements vitamin D without any suspected side-effects. Last vitamin D was  Lab Results  Component Value Date   VD25OH 109 (H) 07/19/2019    Current Outpatient Medications on File Prior to Visit  Medication Sig  . aspirin 81 MG tablet Take  daily.  . citalopram  40 MG tablet Take      1 tablet      Daily     glipiZIDE  5 MG tablet Take 1/2-1 tab with breakfast as needed for sugars.  . hydrochlorothiazide 25 MG tablet Take 0.5 tablets (12.5 mg total) by mouth daily.  Marland Kitchen lisinopril  20 MG tablet Take      1 tablet     Daily    . MetFORMIN-XR 500 MG  Take     2 tablets     2 x /day   .  rosuvastatin  40 MG tablet Take     1 tablet     Daily     for Cholesterol  . verapamil-SR  240 MG CR t Take 1 tablet Daily      Allergies  Allergen Reactions  . Invokamet [Canagliflozin-Metformin Hcl] Other (See Comments)    Extremity edema/caused pain  . Invokana [Canagliflozin] Other (See Comments)    Extremity edema/caused pain  . Codeine Camsylate [Codeine] Rash  . Morphine And Related Rash    PMHx:   Past Medical History:  Diagnosis Date  . Diabetic neuropathy ([name])   . Diverticulitis   . History of hepatitis C    has been treated in the past  . History of kidney stones   . Hyperlipidemia   . Hypertension   . 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[name])   . Other testicular hypofunction   . Severe sepsis ([name]) 08/26/2019  .  Type II or unspecified type diabetes mellitus without mention of complication, not stated as uncontrolled   . Vitamin D deficiency     Immunization History  Administered Date(s) Administered  . PPD Test 08/31/2013, 11/08/2014, 04/03/2017, 07/14/2018, 07/19/2019  . Td 01/21/2005  . Tdap 12/25/2016    Past Surgical History:  Procedure Laterality Date  . AMPUTATION Right 09/01/2019   Procedure: RIGHT BELOW KNEE AMPUTATION;  Surgeon: Nadara Mustard, MD;  Location: [facility];  Service: Orthopedics;  Laterality: Right;  . CHOLECYSTECTOMY  1987  . COLONOSCOPY    . I & D EXTREMITY Right 08/20/2019   Procedure: IRRIGATION & DEBRIDEMENT RIGHT FOOT PARTIAL CALCANEAL EXCISION;  Surgeon: Nadara Mustard, MD;  Location: [facility];  Service: Orthopedics;  Laterality: Right;  . I & D EXTREMITY Right 08/27/2019   Procedure: IRRIGATION AND DEBRIDEMENT  OF FOOT;  Surgeon: Tarry Kos, MD;  Location: [facility] OR;  Service: Orthopedics;  Laterality: Right;  . ORIF TIBIA FRACTURE     x 3 right leg    FHx:    Reviewed / unchanged  SHx:    Reviewed / unchanged   Systems Review:  Constitutional: Denies fever, chills, wt changes, headaches, insomnia, fatigue, night sweats, change in  appetite. Eyes: Denies redness, blurred vision, diplopia, discharge, itchy, watery eyes.  ENT: Denies discharge, congestion, post nasal drip, epistaxis, sore throat, earache, hearing loss, dental pain, tinnitus, vertigo, sinus pain, snoring.  CV: Denies chest pain, palpitations, irregular heartbeat, syncope, dyspnea, diaphoresis, orthopnea, PND, claudication or edema. Respiratory: denies cough, dyspnea, DOE, pleurisy, hoarseness, laryngitis, wheezing.  Gastrointestinal: Denies dysphagia, odynophagia, heartburn, reflux, water brash, abdominal pain or cramps, nausea, vomiting, bloating, diarrhea, constipation, hematemesis, melena, hematochezia  or hemorrhoids. Genitourinary: Denies dysuria, frequency, urgency, nocturia, hesitancy, discharge, hematuria or flank pain. Musculoskeletal: Denies arthralgias, myalgias, stiffness, jt. swelling, pain, limping or strain/sprain.  Skin: Denies pruritus, rash, hives, warts, acne, eczema or change in skin lesion(s). Neuro: No weakness, tremor, incoordination, spasms, paresthesia or pain. Psychiatric: Denies confusion, memory loss or sensory loss. Endo: Denies change in weight, skin or hair change.  Heme/Lymph: No excessive bleeding, bruising or enlarged lymph nodes.  Physical Exam  BP 126/64   Pulse 74   Temp (!) 97.3 F (36.3 C)   Resp 16   Ht 6' (1.829 m)   Wt 255 lb (115.7 kg)   HC 74" (188 cm)   SpO2 97%   BMI 34.58 kg/m   Appears  well nourished, well groomed  and in no distress.  Eyes: PERRLA, EOMs, conjunctiva no swelling or erythema. Sinuses: No frontal/maxillary tenderness ENT/Mouth: EAC's clear, TM's nl w/o erythema, bulging. Nares clear w/o erythema, swelling, exudates. Oropharynx clear without erythema or exudates. Oral hygiene is good. Tongue normal, non obstructing. Hearing intact.  Neck: Supple. Thyroid not palpable. Car 2+/2+ without bruits, nodes or JVD. Chest: Respirations nl with BS clear & equal w/o rales, rhonchi, wheezing or  stridor.  Cor: Heart sounds normal w/ regular rate and rhythm without sig. murmurs, gallops, clicks or rubs. Peripheral pulses normal and equal  without edema.  Abdomen: Soft & bowel sounds normal. Non-tender w/o guarding, rebound, hernias, masses or organomegaly.  Lymphatics: Unremarkable.  Musculoskeletal: Full ROM all peripheral extremities, joint stability, 5/5 strength and normal gait.  Skin: Warm, dry without exposed rashes, lesions or ecchymosis apparent.  Neuro: Cranial nerves intact, reflexes equal bilaterally. Sensory-motor testing grossly intact. Tendon reflexes grossly intact.  Pysch: Alert & oriented x 3.  Insight and judgement nl & appropriate.  No ideations.  Assessment and Plan:  1. Essential hypertension  - Continue medication, monitor blood pressure at home.  - Continue DASH diet.  Reminder to go to the ER if any CP,  SOB, nausea, dizziness, severe HA, changes vision/speech.  - CBC with Differential/Platelet - COMPLETE METABOLIC PANEL WITH GFR - Magnesium - TSH  2. Hyperlipidemia associated with type 2 diabetes mellitus ([name])  - Continue diet/meds, exercise,& lifestyle modifications.  - Continue monitor periodic cholesterol/liver & renal functions   - Lipid panel - TSH  3. Type 2 diabetes mellitus with stage 2 chronic kidney  disease, without long-term current use of insulin ([name])  - Continue diet, exercise  - Lifestyle modifications.  - Monitor appropriate labs.  - Hemoglobin A1c - Insulin, random  4. Vitamin D deficiency  - Continue supplementation.  - VITAMIN D 25 Hydroxy   5. Medication management  - CBC with Differential/Platelet - COMPLETE METABOLIC PANEL WITH GFR - Magnesium - Lipid panel - TSH - Hemoglobin A1c - Insulin, random - VITAMIN D 25 Hydroxy        Discussed  regular exercise, BP monitoring, weight control to achieve/maintain BMI less than 25 and discussed med and SE's. Recommended labs to assess and monitor clinical status  with further disposition pending results of labs.  I discussed the assessment and treatment plan with the patient. The patient was provided an opportunity to ask questions and all were answered. The patient agreed with the plan and demonstrated an understanding of the instructions.  I provided over 30 minutes of exam, counseling, chart review and  complex critical decision making.   Marinus Maw, MD

## 2020-01-20 NOTE — Patient Instructions (Signed)

## 2020-01-22 ENCOUNTER — Encounter: Payer: Self-pay | Admitting: Internal Medicine

## 2020-01-24 LAB — COMPLETE METABOLIC PANEL WITH GFR
AG Ratio: 1.5 (calc) (ref 1.0–2.5)
ALT: 25 U/L (ref 9–46)
AST: 21 U/L (ref 10–35)
Albumin: 4.1 g/dL (ref 3.6–5.1)
Alkaline phosphatase (APISO): 75 U/L (ref 35–144)
BUN/Creatinine Ratio: 28 (calc) — ABNORMAL HIGH (ref 6–22)
BUN: 27 mg/dL — ABNORMAL HIGH (ref 7–25)
CO2: 28 mmol/L (ref 20–32)
Calcium: 10.1 mg/dL (ref 8.6–10.3)
Chloride: 101 mmol/L (ref 98–110)
Creat: 0.95 mg/dL (ref 0.70–1.25)
GFR, Est African American: 100 mL/min/{1.73_m2} (ref >=60)
GFR, Est Non African American: 86 mL/min/{1.73_m2} (ref >=60)
Globulin: 2.8 g/dL (calc) (ref 1.9–3.7)
Glucose, Bld: 68 mg/dL (ref 65–139)
Potassium: 5.1 mmol/L (ref 3.5–5.3)
Sodium: 137 mmol/L (ref 135–146)
Total Bilirubin: 0.4 mg/dL (ref 0.2–1.2)
Total Protein: 6.9 g/dL (ref 6.1–8.1)

## 2020-01-24 LAB — CBC WITH DIFFERENTIAL/PLATELET
Absolute Monocytes: 994 cells/uL — ABNORMAL HIGH (ref 200–950)
Basophils Absolute: 83 cells/uL (ref 0–200)
Basophils Relative: 0.9 %
Eosinophils Absolute: 138 cells/uL (ref 15–500)
Eosinophils Relative: 1.5 %
HCT: 44 % (ref 38.5–50.0)
Hemoglobin: 15.2 g/dL (ref 13.2–17.1)
Lymphs Abs: 3257 cells/uL (ref 850–3900)
MCH: 31.6 pg (ref 27.0–33.0)
MCHC: 34.5 g/dL (ref 32.0–36.0)
MCV: 91.5 fL (ref 80.0–100.0)
MPV: 11.4 fL (ref 7.5–12.5)
Monocytes Relative: 10.8 %
Neutro Abs: 4729 cells/uL (ref 1500–7800)
Neutrophils Relative %: 51.4 %
Platelets: 236 10*3/uL (ref 140–400)
RBC: 4.81 10*6/uL (ref 4.20–5.80)
RDW: 12.7 % (ref 11.0–15.0)
Total Lymphocyte: 35.4 %
WBC: 9.2 10*3/uL (ref 3.8–10.8)

## 2020-01-24 LAB — VITAMIN D 25 HYDROXY (VIT D DEFICIENCY, FRACTURES): Vit D, 25-Hydroxy: 110 ng/mL — ABNORMAL HIGH (ref 30–100)

## 2020-01-24 LAB — HEMOGLOBIN A1C
Hgb A1c MFr Bld: 6.3 % of total Hgb — ABNORMAL HIGH (ref <5.7)
Mean Plasma Glucose: 134 mg/dL
eAG (mmol/L): 7.4 mmol/L

## 2020-01-24 LAB — LIPID PANEL
Cholesterol: 147 mg/dL (ref <200)
HDL: 33 mg/dL — ABNORMAL LOW (ref >=40)
LDL Cholesterol (Calc): 84 mg/dL (calc)
Non-HDL Cholesterol (Calc): 114 mg/dL (calc) (ref <130)
Total CHOL/HDL Ratio: 4.5 (calc) (ref <5.0)
Triglycerides: 209 mg/dL — ABNORMAL HIGH (ref <150)

## 2020-01-24 LAB — TSH: TSH: 1.86 mIU/L (ref 0.40–4.50)

## 2020-01-24 LAB — INSULIN, RANDOM: Insulin: 83.5 u[IU]/mL — ABNORMAL HIGH

## 2020-01-24 LAB — MAGNESIUM: Magnesium: 2.1 mg/dL (ref 1.5–2.5)

## 2020-03-12 ENCOUNTER — Other Ambulatory Visit: Payer: Self-pay | Admitting: Adult Health

## 2020-03-12 ENCOUNTER — Other Ambulatory Visit: Payer: Self-pay | Admitting: Internal Medicine

## 2020-03-12 DIAGNOSIS — I10 Essential (primary) hypertension: Secondary | ICD-10-CM

## 2020-03-16 ENCOUNTER — Other Ambulatory Visit: Payer: Self-pay | Admitting: *Deleted

## 2020-03-16 MED ORDER — GLIPIZIDE 5 MG PO TABS: ORAL_TABLET | ORAL | 0 refills | Status: DC

## 2020-04-20 ENCOUNTER — Encounter: Payer: Self-pay | Admitting: Adult Health Nurse Practitioner

## 2020-04-20 ENCOUNTER — Ambulatory Visit (INDEPENDENT_AMBULATORY_CARE_PROVIDER_SITE_OTHER): Payer: BC Managed Care – PPO | Admitting: Adult Health Nurse Practitioner

## 2020-04-20 ENCOUNTER — Other Ambulatory Visit: Payer: Self-pay

## 2020-04-20 VITALS — BP 126/68 | HR 64 | Temp 97.3°F | Wt 262.0 lb

## 2020-04-20 DIAGNOSIS — E1122 Type 2 diabetes mellitus with diabetic chronic kidney disease: Secondary | ICD-10-CM

## 2020-04-20 DIAGNOSIS — E785 Hyperlipidemia, unspecified: Secondary | ICD-10-CM

## 2020-04-20 DIAGNOSIS — E1169 Type 2 diabetes mellitus with other specified complication: Secondary | ICD-10-CM

## 2020-04-20 DIAGNOSIS — E559 Vitamin D deficiency, unspecified: Secondary | ICD-10-CM

## 2020-04-20 DIAGNOSIS — I10 Essential (primary) hypertension: Secondary | ICD-10-CM | POA: Diagnosis not present

## 2020-04-20 DIAGNOSIS — N182 Chronic kidney disease, stage 2 (mild): Secondary | ICD-10-CM

## 2020-04-20 NOTE — Progress Notes (Signed)
FOLLOW UP 3 MONTH  Assessment and Plan:   [name] was seen today for follow-up.  Diagnoses and all orders for this visit:  Essential Hypertension Some concern with hypotension; reduce HCTZ 1/2 tab Continue lisinopril and verapamil;  Monitor blood pressure at home; patient to call if consistently greater than 130/80 Continue DASH diet.   Reminder to go to the ER if any CP, SOB, nausea, dizziness, severe HA, changes vision/speech, left arm numbness and tingling and jaw pain. -     CBC with Differential/Platelet -     COMPLETE METABOLIC PANEL WITH GFR  Hyperlipidemia associated with type 2 diabetes mellitus ([name]) Taking rosuvastatin 40mg  daily Currently goal of LDL <70;  Continue low cholesterol diet and exercise.  -     Lipid panel  Type 2 diabetes mellitus with stage 2 chronic kidney disease, without long-term current use of insulin ([name]) Discussed dietary and exercise modifications -     Hemoglobin A1c  Vitamin D deficiency Continue supplementation to maintain goal of 70-100 Taking Vitamin D 50,000 IU twice a week -     VITAMIN D 25 Hydroxy (Vit-D Deficiency)  Tobacco use Discussed risks associated with tobacco use and advised to reduce or quit Patient is ready to do so and plans to quit cold ; has success with this in the past Declines Chantix or other medication; resources given Will follow up at the next visit  COPD Advised to stop smoking - risks discussed. Declines medications.  Currently stable without respiratory medications  R BTK amputation ([name]) Stump healed well; completed PT, well fitting prosthesis  He is in good spirits; has improved glucose control tremendously   Continue diet and meds as discussed. Further disposition pending results of labs. Discussed med's effects and SE's.   Over 30 minutes of face to face interview, exam, counseling, chart review, and critical decision making was performed.   Future Appointments  Date Time Provider  Department Center  07/19/2020  3:00 PM 07/21/2020, MD [facility] None    ----------------------------------------------------------------------------------------------------------------------  HPI 60 y.o. male  presents for 3 month follow up on hypertension, cholesterol, diabetes, weight and vitamin D deficiency.  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 He has followed up ortho, very pleased with progress, good spirits, doing PT and got prosthesis fitting today.   He currently continues to smoke 1 pack a day x 45 years; discussed risks associated with smoking, patient is ready to quit. Has quit cold 11/01/2019 before, declines meds.    BMI is Body mass index is 35.53 kg/m., he has been working on diet and exercise, working with PT, watching portions, Wt Readings from Last 3 Encounters:  04/20/20 262 lb (118.8 kg)  01/20/20 255 lb (115.7 kg)  12/09/19 247 lb (112 kg)   His blood pressure has been controlled at home, today their BP is BP: 126/68  He does workout. He denies chest pain, shortness of breath, dizziness.   He is on cholesterol medication rosuvastatin 40mg  daily and denies myalgias. His cholesterol is not at goal. The cholesterol last visit was:   Lab Results  Component Value Date   CHOL 147 01/20/2020   HDL 33 (L) 01/20/2020   LDLCALC 84 01/20/2020   TRIG 209 (H) 01/20/2020   CHOLHDL 4.5 01/20/2020    He has been working on diet and exercise for T2 diabetes  Neuropathy in bil feet, some numbness and weakness  in hands and feet, was on gabapentin but recently improved Hyperlipidemia- Rosuvastatin 40 mg Metformin 500mg  one in morning and one in evening. On Glipizide 5 mg, half tablet every other once daily with breakfast, denies hypoglycemia On metformin 500 mg BID denies foot ulcerations, increased appetite, nausea, polydipsia, polyuria, visual  disturbances, vomiting and weight loss.  He checks sugars occasionally, 80-120 Last A1C in the office was:  Lab Results  Component Value Date   HGBA1C 6.3 (H) 01/20/2020   Lab Results  Component Value Date   GFRNONAA 86 01/20/2020   Patient is on Vitamin D supplement and at goal at recent check:    Lab Results  Component Value Date   VD25OH 110 (H) 01/20/2020         Current Medications:  Current Outpatient Medications on File Prior to Visit  Medication Sig  . aspirin 81 MG tablet Take 81 mg by mouth daily.  . citalopram (CELEXA) 40 MG tablet Take  1 tablet  Daily  for Chronic Anxiety & Mood  . Continuous Blood Gluc Sensor (FREESTYLE LIBRE SENSOR SYSTEM) MISC 1 each by Does not apply route daily.  01/22/2020 glipiZIDE (GLUCOTROL) 5 MG tablet Takes 1/2 to 1 tablet with breakfast as needed for sugars.  . hydrochlorothiazide (HYDRODIURIL) 25 MG tablet Take  1 tablet  Daily  for BP, Fluid Retention & Ankle Swelling  . lisinopril (ZESTRIL) 20 MG tablet Take  1 tablet  Daily  for BP & Diabetic Kidney Protection  . metFORMIN (GLUCOPHAGE XR) 500 MG 24 hr tablet Take     2 tablets     2 x /day      with Meals       for Diabetes  . rosuvastatin (CRESTOR) 40 MG tablet Take     1 tablet     Daily     for Cholesterol  . verapamil (CALAN-SR) 240 MG CR tablet Take 1 tablet Daily with Food for BP & Heart  Rhythm   No current facility-administered medications on file prior to visit.     Allergies:  Allergies  Allergen Reactions  . Invokamet [Canagliflozin-Metformin Hcl] Other (See Comments)    Extremity edema/caused pain  . Invokana [Canagliflozin] Other (See Comments)    Extremity edema/caused pain  . Codeine Camsylate [Codeine] Rash  . Morphine And Related Rash     Medical History:  Past Medical History:  Diagnosis Date  . Diabetic neuropathy ([name])   . Diverticulitis   . History of hepatitis C    has been treated in the past  . History of kidney stones   . Hyperlipidemia   .  Hypertension   . 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[name])   . Other testicular hypofunction   . Severe sepsis ([name]) 08/26/2019  . Type II or unspecified type diabetes mellitus without mention of complication, not stated as uncontrolled   . Vitamin D deficiency    Family history- Reviewed and unchanged Social history- Reviewed and unchanged   Review of Systems:  Review of Systems  Constitutional: Negative for malaise/fatigue and weight loss.  HENT: Negative for hearing loss and tinnitus.   Eyes: Negative for blurred vision and double vision.  Respiratory: Negative for cough, shortness of breath and wheezing.   Cardiovascular: Negative for chest pain, palpitations, orthopnea, claudication and leg swelling.  Gastrointestinal: Negative for abdominal pain, blood in stool, constipation, diarrhea, heartburn, melena, nausea and vomiting.  Genitourinary: Negative.   Musculoskeletal: Negative for joint pain and myalgias.  Skin: Negative for rash.  Neurological: Negative for dizziness, tingling, sensory change, weakness and headaches.  Endo/Heme/Allergies: Negative for polydipsia.  Psychiatric/Behavioral: Negative for depression, hallucinations, memory loss and substance abuse. The patient is not nervous/anxious.   All other systems reviewed and are negative.    Physical Exam: BP 126/68   Pulse 64   Temp (!) 97.3 F (36.3 C)   Wt 262 lb (118.8 kg)   SpO2 99%   BMI 35.53 kg/m  Wt Readings from Last 3 Encounters:  04/20/20 262 lb (118.8 kg)  01/20/20 255 lb (115.7 kg)  12/09/19 247 lb (112 kg)   General Appearance: Well nourished, in no apparent distress. Eyes: PERRLA, EOMs, conjunctiva no swelling or erythema Sinuses: No Frontal/maxillary tenderness ENT/Mouth: Ext aud canals clear, TMs without erythema, bulging. No erythema, swelling, or exudate on post pharynx.  Tonsils not swollen or erythematous. Hearing normal.  Neck: Supple, thyroid normal.  Respiratory: Respiratory effort normal, BS  equal bilaterally with diffuse wheezing without rales, rhonchi,  or stridor.  Cardio: RRR with no MRGs. Brisk peripheral pulses without edema.  Abdomen: Soft, + BS.  Non tender, no guarding, rebound, hernias, masses. Lymphatics: Non tender without lymphadenopathy.  Musculoskeletal:; R BTK amputation w/ prosthesis. Otherwise no deformity. Symmetrical upper extremity strength.  Skin: Warm, dry without rashes, lesions, ecchymosis.  Neuro: Cranial nerves intact. No cerebellar symptoms.  Psych: Awake and oriented X 3, normal affect, Insight and Judgment appropriate.    Elder Negus, NP 4:10 PM [facility]

## 2020-04-21 LAB — CBC WITH DIFFERENTIAL/PLATELET
Absolute Monocytes: 933 cells/uL (ref 200–950)
Basophils Absolute: 85 cells/uL (ref 0–200)
Basophils Relative: 0.8 %
Eosinophils Absolute: 191 cells/uL (ref 15–500)
Eosinophils Relative: 1.8 %
HCT: 46 % (ref 38.5–50.0)
Hemoglobin: 15.5 g/dL (ref 13.2–17.1)
Lymphs Abs: 3996 cells/uL — ABNORMAL HIGH (ref 850–3900)
MCH: 32 pg (ref 27.0–33.0)
MCHC: 33.7 g/dL (ref 32.0–36.0)
MCV: 95 fL (ref 80.0–100.0)
MPV: 11.6 fL (ref 7.5–12.5)
Monocytes Relative: 8.8 %
Neutro Abs: 5395 cells/uL (ref 1500–7800)
Neutrophils Relative %: 50.9 %
Platelets: 211 10*3/uL (ref 140–400)
RBC: 4.84 10*6/uL (ref 4.20–5.80)
RDW: 12.9 % (ref 11.0–15.0)
Total Lymphocyte: 37.7 %
WBC: 10.6 10*3/uL (ref 3.8–10.8)

## 2020-04-21 LAB — COMPLETE METABOLIC PANEL WITH GFR
AG Ratio: 1.6 (calc) (ref 1.0–2.5)
ALT: 27 U/L (ref 9–46)
AST: 21 U/L (ref 10–35)
Albumin: 4.2 g/dL (ref 3.6–5.1)
Alkaline phosphatase (APISO): 62 U/L (ref 35–144)
BUN: 21 mg/dL (ref 7–25)
CO2: 26 mmol/L (ref 20–32)
Calcium: 9.8 mg/dL (ref 8.6–10.3)
Chloride: 101 mmol/L (ref 98–110)
Creat: 0.75 mg/dL (ref 0.70–1.25)
GFR, Est African American: 115 mL/min/{1.73_m2} (ref >=60)
GFR, Est Non African American: 99 mL/min/{1.73_m2} (ref >=60)
Globulin: 2.6 g/dL (calc) (ref 1.9–3.7)
Glucose, Bld: 74 mg/dL (ref 65–99)
Potassium: 4.5 mmol/L (ref 3.5–5.3)
Sodium: 137 mmol/L (ref 135–146)
Total Bilirubin: 0.5 mg/dL (ref 0.2–1.2)
Total Protein: 6.8 g/dL (ref 6.1–8.1)

## 2020-04-21 LAB — HEMOGLOBIN A1C
Hgb A1c MFr Bld: 6.5 % of total Hgb — ABNORMAL HIGH (ref <5.7)
Mean Plasma Glucose: 140 mg/dL
eAG (mmol/L): 7.7 mmol/L

## 2020-04-21 LAB — LIPID PANEL
Cholesterol: 155 mg/dL (ref <200)
HDL: 30 mg/dL — ABNORMAL LOW (ref >=40)
LDL Cholesterol (Calc): 93 mg/dL (calc)
Non-HDL Cholesterol (Calc): 125 mg/dL (calc) (ref <130)
Total CHOL/HDL Ratio: 5.2 (calc) — ABNORMAL HIGH (ref <5.0)
Triglycerides: 218 mg/dL — ABNORMAL HIGH (ref <150)

## 2020-04-21 LAB — VITAMIN D 25 HYDROXY (VIT D DEFICIENCY, FRACTURES): Vit D, 25-Hydroxy: 112 ng/mL — ABNORMAL HIGH (ref 30–100)

## 2020-04-22 ENCOUNTER — Other Ambulatory Visit: Payer: Self-pay | Admitting: Adult Health Nurse Practitioner

## 2020-04-22 DIAGNOSIS — D72820 Lymphocytosis (symptomatic): Secondary | ICD-10-CM

## 2020-04-22 NOTE — Progress Notes (Signed)
Please contact patient with lab results:  Blood count: He has a trend of elevated lymphocytes, want to check further labs.  Have him stop by early next week for labs. (already entered)  Kidney, liver function is in normal range. A1c stable at 6.5  Vitamin D elevated 112, skip two days a week of supplementation.  Your body holds on to this if there is excess so this should be slightly lower.  Cholesterol:  About same as last check, triglycerides remain elevated 218, decreased any fried foods, white bread, pasta, rice, white potatoes and sugars to help decrease this.   Sincerely,           Elder Negus, NP

## 2020-04-29 ENCOUNTER — Other Ambulatory Visit: Payer: Self-pay | Admitting: Internal Medicine

## 2020-05-01 ENCOUNTER — Other Ambulatory Visit: Payer: Self-pay | Admitting: Adult Health

## 2020-05-01 DIAGNOSIS — E1169 Type 2 diabetes mellitus with other specified complication: Secondary | ICD-10-CM

## 2020-05-01 MED ORDER — ROSUVASTATIN CALCIUM 20 MG PO TABS
ORAL_TABLET | ORAL | 3 refills | Status: DC
Start: 2020-05-01 — End: 2020-10-27

## 2020-05-14 DIAGNOSIS — Z89511 Acquired absence of right leg below knee: Secondary | ICD-10-CM | POA: Diagnosis not present

## 2020-05-30 ENCOUNTER — Ambulatory Visit: Payer: BC Managed Care – PPO | Admitting: Physician Assistant

## 2020-05-30 ENCOUNTER — Encounter: Payer: Self-pay | Admitting: Orthopedic Surgery

## 2020-05-30 DIAGNOSIS — Z89511 Acquired absence of right leg below knee: Secondary | ICD-10-CM

## 2020-05-30 NOTE — Progress Notes (Signed)
Office Visit Note   Patient: [name]           Date of Birth: 05/29/1960           MRN: [id number] Visit Date: 05/30/2020              Requested by: Lucky Cowboy, MD [address] [place],  [place] [address] PCP: Lucky Cowboy, MD  Chief Complaint  Patient presents with  . Right Knee - Follow-up      HPI: Patient is a pleasant 60 year old gentleman who is status post right below-knee amputation.  His prosthetic has become ill fitting.  It is too large and despite modifications including multiple socks it is no longer appropriate.  He also has wear in his liner.  He feels unstable.  Assessment & Plan: Visit Diagnoses: No diagnosis found.  Plan: Prescription for K3 socket and supplies was provided to him today he may follow-up as needed Patient is an existing right transtibial  amputee.  Patient's current comorbidities are not expected to impact the ability to function with the prescribed prosthesis. Patient verbally communicates a strong desire to use a prosthesis. Patient currently requires mobility aids to ambulate without a prosthesis.  Expects not to use mobility aids with a new prosthesis.  Patient is a K3 level ambulator that spends a lot of time walking around on uneven terrain over obstacles, up and down stairs, and ambulates with a variable cadence.    Follow-Up Instructions: No follow-ups on file.   Ortho Exam  Patient is alert, oriented, no adenopathy, well-dressed, normal affect, normal respiratory effort. Examination status post right below-knee amputation significant discrepancy between the socket and amputation stump with obvious loss of volume.  Otherwise amputation stump is in excellent condition well-healed surgical incision no signs of infection  Imaging: No results found. No images are attached to the encounter.  Labs: Lab Results  Component Value Date   HGBA1C 6.5 (H) 04/20/2020   HGBA1C 6.3 (H) 01/20/2020   HGBA1C  5.4 10/21/2019   REPTSTATUS 09/06/2019 FINAL 09/01/2019   GRAMSTAIN  09/01/2019    MODERATE WBC PRESENT, PREDOMINANTLY PMN NO ORGANISMS SEEN    CULT  09/01/2019    No growth aerobically or anaerobically. Performed at [facility], 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0 44.0  PLT 140 - 400 Thousand/uL 211 236 252  NEUTROABS 1,500 - 7,800 cells/uL 5,395 4,729 5,556  LYMPHSABS 850 - 3,900 cells/uL 3,996(H) 3,257 5,016(H)     There is no height or weight on file to calculate BMI.  Orders:  No orders of the defined types were placed in this encounter.  No orders of the defined types were  placed in this encounter.    Procedures: No procedures performed  Clinical Data: No additional findings.  ROS:  All other systems negative, except as noted in the HPI. Review of Systems  Objective: Vital Signs: There were no vitals taken for this visit.  Specialty Comments:  No specialty comments available.  PMFS History: Patient Active Problem List   Diagnosis Date Noted  . Right below-knee amputee ([name]) 09/03/2019  . Hyponatremia 08/26/2019  . Cutaneous abscess of right foot   . Poorly controlled diabetes mellitus ([name]) 07/16/2019  . FHx: heart disease 10/08/2017  . Type 2 diabetes mellitus with stage 2 chronic  kidney disease, without long-term current use of insulin ([name]) 10/08/2017  . Insomnia 07/08/2017  . Smoker 08/17/2015  . COPD (chronic obstructive pulmonary disease) ([name]) 08/17/2015  . 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 Medication management 05/19/2013  . Morbid obesity (BMI 35) 05/19/2013  . Essential hypertension   . Hyperlipidemia associated with type 2 diabetes mellitus ([name])   . Vitamin D deficiency   . History of kidney stones   . Testosterone deficiency   . History of hepatitis C    Past Medical History:  Diagnosis Date  . Diabetic neuropathy ([name])   . Diverticulitis   . History of hepatitis C    has been treated in the past  . History of kidney stones   . Hyperlipidemia   . Hypertension   . 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[name])   . Other testicular hypofunction   . Severe sepsis ([name]) 08/26/2019  . Type II or unspecified type diabetes mellitus without mention of complication, not stated as uncontrolled   . Vitamin D deficiency     Family History  Problem Relation Age of Onset  . Hypertension Mother   . Cancer Father        colon  . Alzheimer's disease Father   . Stroke Father     Past Surgical History:  Procedure Laterality Date  . AMPUTATION Right 09/01/2019   Procedure: RIGHT BELOW KNEE AMPUTATION;  Surgeon: Nadara Mustard, MD;  Location: [facility] OR;  Service: Orthopedics;  Laterality: Right;  . CHOLECYSTECTOMY  1987  . COLONOSCOPY    . I & D EXTREMITY Right 08/20/2019   Procedure: IRRIGATION & DEBRIDEMENT RIGHT FOOT PARTIAL CALCANEAL EXCISION;  Surgeon: Nadara Mustard, MD;  Location: [facility] OR;  Service: Orthopedics;  Laterality: Right;  . I & D EXTREMITY Right 08/27/2019   Procedure: IRRIGATION AND DEBRIDEMENT  OF FOOT;  Surgeon: Tarry Kos, MD;  Location: [facility] OR;  Service: Orthopedics;  Laterality: Right;  . ORIF TIBIA FRACTURE     x 3 right leg   Social History   Occupational History  . Not  on file  Tobacco Use  . Smoking status: Current Every Day Smoker    Packs/day: 1.00    Types: Cigarettes  . Smokeless tobacco: Never Used  Substance and Sexual Activity  . Alcohol use: No  . Drug use: Yes    Types: Marijuana  . Sexual activity: Not on file

## 2020-06-13 DIAGNOSIS — Z89511 Acquired absence of right leg below knee: Secondary | ICD-10-CM | POA: Diagnosis not present

## 2020-06-20 ENCOUNTER — Other Ambulatory Visit: Payer: Self-pay | Admitting: Internal Medicine

## 2020-07-19 ENCOUNTER — Other Ambulatory Visit: Payer: Self-pay

## 2020-07-19 ENCOUNTER — Ambulatory Visit (INDEPENDENT_AMBULATORY_CARE_PROVIDER_SITE_OTHER): Payer: 59 | Admitting: Internal Medicine

## 2020-07-19 ENCOUNTER — Encounter: Payer: Self-pay | Admitting: Internal Medicine

## 2020-07-19 VITALS — BP 109/63 | HR 62 | Temp 97.3°F | Resp 16 | Ht 74.0 in | Wt 265.4 lb

## 2020-07-19 DIAGNOSIS — Z111 Encounter for screening for respiratory tuberculosis: Secondary | ICD-10-CM

## 2020-07-19 DIAGNOSIS — Z8249 Family history of ischemic heart disease and other diseases of the circulatory system: Secondary | ICD-10-CM

## 2020-07-19 DIAGNOSIS — Z1211 Encounter for screening for malignant neoplasm of colon: Secondary | ICD-10-CM

## 2020-07-19 DIAGNOSIS — Z136 Encounter for screening for cardiovascular disorders: Secondary | ICD-10-CM

## 2020-07-19 DIAGNOSIS — E1169 Type 2 diabetes mellitus with other specified complication: Secondary | ICD-10-CM

## 2020-07-19 DIAGNOSIS — Z79899 Other long term (current) drug therapy: Secondary | ICD-10-CM

## 2020-07-19 DIAGNOSIS — E559 Vitamin D deficiency, unspecified: Secondary | ICD-10-CM

## 2020-07-19 DIAGNOSIS — R5383 Other fatigue: Secondary | ICD-10-CM

## 2020-07-19 DIAGNOSIS — Z0001 Encounter for general adult medical examination with abnormal findings: Secondary | ICD-10-CM

## 2020-07-19 DIAGNOSIS — Z89511 Acquired absence of right leg below knee: Secondary | ICD-10-CM

## 2020-07-19 DIAGNOSIS — N182 Chronic kidney disease, stage 2 (mild): Secondary | ICD-10-CM

## 2020-07-19 DIAGNOSIS — Z125 Encounter for screening for malignant neoplasm of prostate: Secondary | ICD-10-CM

## 2020-07-19 DIAGNOSIS — E1122 Type 2 diabetes mellitus with diabetic chronic kidney disease: Secondary | ICD-10-CM

## 2020-07-19 DIAGNOSIS — I10 Essential (primary) hypertension: Secondary | ICD-10-CM

## 2020-07-19 DIAGNOSIS — Z0000 Encounter for general adult medical examination without abnormal findings: Secondary | ICD-10-CM | POA: Diagnosis not present

## 2020-07-19 DIAGNOSIS — E349 Endocrine disorder, unspecified: Secondary | ICD-10-CM

## 2020-07-19 NOTE — Patient Instructions (Signed)

## 2020-07-19 NOTE — Progress Notes (Signed)
Annual  Screening/Preventative Visit  & Comprehensive Evaluation & Examination  Future Appointments  Date Time Provider Department Center  07/19/2020  3:00 PM Lucky Cowboy, MD [facility] None  07/19/2021  3:00 PM Lucky Cowboy, MD [facility] None            This very nice 60 y.o. MWM presents for a Screening /Preventative Visit & comprehensive evaluation and management of multiple medical co-morbidities.  Patient has been followed for HTN, HLD, T2_NIDDM  and Vitamin D Deficiency.       HTN predates circa 1990.  Patient's BP has been controlled at home.  Today's BP is at goal - 109/63. Patient denies any cardiac symptoms as chest pain, palpitations, shortness of breath, dizziness or ankle swelling.       Patient's hyperlipidemia is controlled with diet and medications. Patient denies myalgias or other medication SE's. Last lipids were at goal except elevated Trig's:  Lab Results  Component Value Date   CHOL 142 07/19/2020   HDL 31 (L) 07/19/2020   LDLCALC 75 07/19/2020   TRIG 274 (H) 07/19/2020   CHOLHDL 4.6 07/19/2020                                           Patient has hx/o Low Testosterone Deficiency  for several years & he has declined replacement therapy.      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 Last A1c was not at goal:   Lab Results  Component Value Date   HGBA1C 6.2 (H) 07/19/2020        Finally, patient has history of Vitamin D Deficiency ("24" /2008) and last vitamin D was at goal:   Lab Results  Component Value Date   VD25OH 81 07/19/2020     Current Outpatient Medications on File Prior to Visit  Medication Sig   aspirin 81 MG tablet Take daily.   citalopram  40 MG tablet Take  1 tablet  Daily    Hctz  25 MG tablet Take  1 tablet  Daily    lisinopril 20 MG tablet Take 1 tablet  Daily   metFORMIN-XR 500 MG  Take  2  tablets  2 x /day  with Meals     rosuvastatin  20 MG tablet Take 20 mg three days a week    Verapamil-SR 240 MG  Take 1 tablet Daily     Allergies  Allergen Reactions   Invokamet  Extremity edema/caused pain   Invokana [Canagliflozin] Extremity edema/caused pain   Codeine  Rash   Morphine And Related Rash     Past Medical History:  Diagnosis Date   Diabetic neuropathy ([name])    Diverticulitis    History of hepatitis C    has been treated in the past   History of kidney stones    Hyperlipidemia    Hypertension    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[name])    Other testicular hypofunction    Severe sepsis ([name]) 08/26/2019   Type II  diabetes mellitus     Vitamin D deficiency      Health Maintenance  Topic Date Due   PNEUMOCOCCAL VACCINE  Never done   COVID-19 Vaccine (1) Never done   Pneumococcal Vaccine  Never done   OPHTHALMOLOGY EXAM  Never done  Zoster Vaccines- Shingrix (1 of 2) Never done   COLONOSCOPY  Never done   INFLUENZA VACCINE  08/21/2020   HEMOGLOBIN A1C  01/18/2021   FOOT EXAM  07/19/2021   TETANUS/TDAP  12/26/2026   Hepatitis C Screening  Completed   HIV Screening  Completed   HPV VACCINES  Aged Out     Immunization History  Administered Date(s) Administered   PPD Test 04/03/2017, 07/14/2018, 07/19/2019   Td 01/21/2005   Tdap 12/25/2016    Last Colon - 2007 - Dr Neomia Glass - overdue 10 yr f/u (Patient aware overdue)     Past Surgical History:  Procedure Laterality Date   AMPUTATION Right 09/01/2019   Procedure: RIGHT BELOW KNEE AMPUTATION;  Surgeon: Nadara Mustard, MD;  Location: [facility] OR;  Service: Orthopedics;  Laterality: Right;   CHOLECYSTECTOMY  1987   COLONOSCOPY     I & D EXTREMITY Right 08/20/2019   Procedure: IRRIGATION & DEBRIDEMENT RIGHT FOOT PARTIAL CALCANEAL EXCISION;  Surgeon: Nadara Mustard, MD;  Location: [facility] OR;  Service: Orthopedics;  Laterality: Right;   I & D EXTREMITY Right 08/27/2019   Procedure: IRRIGATION AND DEBRIDEMENT  OF  FOOT;  Surgeon: Tarry Kos, MD;  Location: [facility] OR;  Service: Orthopedics;  Laterality: Right;   ORIF TIBIA FRACTURE     x 3 right leg     Family History  Problem Relation Age of Onset   Hypertension Mother    Cancer Father        colon   Alzheimer's disease Father    Stroke Father     Social History   Socioeconomic History   Marital status: Married    Spouse name: Not on file   Number of children: Not on file  Occupational History   Not on file  Tobacco Use   Smoking status: Every Day    Packs/day: 1.00    Pack years: 0.00    Types: Cigarettes   Smokeless tobacco: Never  Substance and Sexual Activity   Alcohol use: No   Drug use: Yes    Types: Marijuana   Sexual activity: Not on file      ROS Constitutional: Denies fever, chills, weight loss/gain, headaches, insomnia,  night sweats or change in appetite. Does c/o fatigue. Eyes: Denies redness, blurred vision, diplopia, discharge, itchy or watery eyes.  ENT: Denies discharge, congestion, post nasal drip, epistaxis, sore throat, earache, hearing loss, dental pain, Tinnitus, Vertigo, Sinus pain or snoring.  Cardio: Denies chest pain, palpitations, irregular heartbeat, syncope, dyspnea, diaphoresis, orthopnea, PND, claudication or edema Respiratory: denies cough, dyspnea, DOE, pleurisy, hoarseness, laryngitis or wheezing.  Gastrointestinal: Denies dysphagia, heartburn, reflux, water brash, pain, cramps, nausea, vomiting, bloating, diarrhea, constipation, hematemesis, melena, hematochezia, jaundice or hemorrhoids Genitourinary: Denies dysuria, frequency, urgency, nocturia, hesitancy, discharge, hematuria or flank pain Musculoskeletal: Denies arthralgia, myalgia, stiffness, Jt. Swelling, pain, limp or strain/sprain. Denies Falls. Skin: Denies puritis, rash, hives, warts, acne, eczema or change in skin lesion Neuro: No weakness, tremor, incoordination, spasms, paresthesia or pain Psychiatric: Denies confusion, memory loss  or sensory loss. Denies Depression. Endocrine: Denies change in weight, skin, hair change, nocturia, and paresthesia, diabetic polys, visual blurring or hyper / hypo glycemic episodes.  Heme/Lymph: No excessive bleeding, bruising or enlarged lymph nodes.   Physical Exam  BP 109/63   Pulse 62   Temp (!) 97.3 F (36.3 C)   Resp 16   Ht 6\' 2"  (1.88 m)   Wt 265 lb 6.4 oz (120.4 kg)  SpO2 99%   BMI 34.08 kg/m   General Appearance: Well nourished and well groomed and in no apparent distress.  Eyes: PERRLA, EOMs, conjunctiva no swelling or erythema, normal fundi and vessels. Sinuses: No frontal/maxillary tenderness ENT/Mouth: EACs patent / TMs  nl. Nares clear without erythema, swelling, mucoid exudates. Oral hygiene is good. No erythema, swelling, or exudate. Tongue normal, non-obstructing. Tonsils not swollen or erythematous. Hearing normal.  Neck: Supple, thyroid not palpable. No bruits, nodes or JVD. Respiratory: Respiratory effort normal.  BS equal and clear bilateral without rales, rhonci, wheezing or stridor. Cardio: Heart sounds are normal with regular rate and rhythm and no murmurs, rubs or gallops. Peripheral pulses are normal and equal bilaterally without edema. No aortic or femoral bruits. Chest: symmetric with normal excursions and percussion.  Abdomen: Soft, with Nl bowel sounds. Nontender, no guarding, rebound, hernias, masses, or organomegaly.  Lymphatics: Non tender without lymphadenopathy.  Musculoskeletal: Full ROM all peripheral extremities, joint stability, 5/5 strength, and normal gait. Skin: Warm and dry without rashes, lesions, cyanosis, clubbing or  ecchymosis.  Neuro: Cranial nerves intact, reflexes equal bilaterally. Normal muscle tone, no cerebellar symptoms. Sensation intact.  Pysch: Alert and oriented X 3 with normal affect, insight and judgment appropriate.   Assessment and Plan  1. Annual Preventative/Screening Exam   2. Essential hypertension  - EKG  12-Lead - [place], RETROPERITNL ABD,  LTD - Microalbumin / creatinine urine ratio - CBC with Differential/Platelet - COMPLETE METABOLIC PANEL WITH GFR - Magnesium - TSH - Urinalysis, Routine w reflex microscopic  3. Hyperlipidemia associated with type 2 diabetes mellitus ([name])  - EKG 12-Lead - [place], RETROPERITNL ABD,  LTD - Lipid panel - TSH  4. Type 2 diabetes mellitus with stage 2 chronic kidney  disease, without long-term current use of insulin ([name])  - EKG 12-Lead - [place], RETROPERITNL ABD,  LTD - Microalbumin / creatinine urine ratio - HM DIABETES FOOT EXAM - LOW EXTREMITY NEUR EXAM DOCUM - Hemoglobin A1c - Insulin, random  5. Vitamin D deficiency  - VITAMIN D 25 Hydroxy  6. Right below-knee amputee ([name])   7. Screening examination for pulmonary tuberculosis  - TB Skin Test  8. Testosterone deficiency  - Testosterone  9. Prostate cancer screening  - PSA  10. Screening for colorectal cancer  - POC Hemoccult Bld/Stl   11. Screening for ischemic heart disease  - EKG 12-Lead  12. FHx: heart disease  - EKG 12-Lead - [place], RETROPERITNL ABD,  LTD  13. Screening for AAA (aortic abdominal aneurysm)  - [place], RETROPERITNL ABD,  LTD  14. Fatigue  - Iron, Total/Total Iron Binding Cap - Vitamin B12 - Testosterone - CBC with Differential/Platelet - TSH  15. Medication management  - Microalbumin / creatinine urine ratio - POC Hemoccult Bld/Stl  - Iron, Total/Total Iron Binding Cap - Vitamin B12 - PSA - Testosterone - CBC with Differential/Platelet - COMPLETE METABOLIC PANEL WITH GFR - Magnesium - Lipid panel - TSH - Hemoglobin A1c - Insulin, random - VITAMIN D 25 Hydroxy  - Urinalysis, Routine w reflex microscopic          Patient was counseled in prudent diet, weight control to achieve/maintain BMI less than 25, BP monitoring, regular exercise and medications as discussed.  Discussed med effects and SE's. Routine screening labs and tests as requested  with regular follow-up as recommended. Over 40 minutes of exam, counseling, chart review and high complex critical decision making was performed   Marinus Maw, MD

## 2020-07-20 LAB — MICROALBUMIN / CREATININE URINE RATIO
Creatinine, Urine: 210 mg/dL (ref 20–320)
Microalb Creat Ratio: 7 mcg/mg creat (ref <30)
Microalb, Ur: 1.4 mg/dL

## 2020-07-20 LAB — HEMOGLOBIN A1C
Hgb A1c MFr Bld: 6.2 % of total Hgb — ABNORMAL HIGH (ref <5.7)
Mean Plasma Glucose: 131 mg/dL
eAG (mmol/L): 7.3 mmol/L

## 2020-07-20 LAB — CBC WITH DIFFERENTIAL/PLATELET
Absolute Monocytes: 1288 cells/uL — ABNORMAL HIGH (ref 200–950)
Basophils Absolute: 84 cells/uL (ref 0–200)
Basophils Relative: 0.6 %
Eosinophils Absolute: 196 cells/uL (ref 15–500)
Eosinophils Relative: 1.4 %
HCT: 45.5 % (ref 38.5–50.0)
Hemoglobin: 15.9 g/dL (ref 13.2–17.1)
Lymphs Abs: 5166 cells/uL — ABNORMAL HIGH (ref 850–3900)
MCH: 33.1 pg — ABNORMAL HIGH (ref 27.0–33.0)
MCHC: 34.9 g/dL (ref 32.0–36.0)
MCV: 94.8 fL (ref 80.0–100.0)
MPV: 11.6 fL (ref 7.5–12.5)
Monocytes Relative: 9.2 %
Neutro Abs: 7266 cells/uL (ref 1500–7800)
Neutrophils Relative %: 51.9 %
Platelets: 204 10*3/uL (ref 140–400)
RBC: 4.8 10*6/uL (ref 4.20–5.80)
RDW: 13.1 % (ref 11.0–15.0)
Total Lymphocyte: 36.9 %
WBC: 14 10*3/uL — ABNORMAL HIGH (ref 3.8–10.8)

## 2020-07-20 LAB — LIPID PANEL
Cholesterol: 142 mg/dL (ref <200)
HDL: 31 mg/dL — ABNORMAL LOW (ref >=40)
LDL Cholesterol (Calc): 75 mg/dL (calc)
Non-HDL Cholesterol (Calc): 111 mg/dL (calc) (ref <130)
Total CHOL/HDL Ratio: 4.6 (calc) (ref <5.0)
Triglycerides: 274 mg/dL — ABNORMAL HIGH (ref <150)

## 2020-07-20 LAB — COMPLETE METABOLIC PANEL WITH GFR
AG Ratio: 1.4 (calc) (ref 1.0–2.5)
ALT: 28 U/L (ref 9–46)
AST: 22 U/L (ref 10–35)
Albumin: 4.2 g/dL (ref 3.6–5.1)
Alkaline phosphatase (APISO): 64 U/L (ref 35–144)
BUN: 25 mg/dL (ref 7–25)
CO2: 25 mmol/L (ref 20–32)
Calcium: 9.6 mg/dL (ref 8.6–10.3)
Chloride: 103 mmol/L (ref 98–110)
Creat: 0.96 mg/dL (ref 0.70–1.25)
GFR, Est African American: 98 mL/min/{1.73_m2} (ref >=60)
GFR, Est Non African American: 85 mL/min/{1.73_m2} (ref >=60)
Globulin: 2.9 g/dL (calc) (ref 1.9–3.7)
Glucose, Bld: 71 mg/dL (ref 65–99)
Potassium: 4.3 mmol/L (ref 3.5–5.3)
Sodium: 138 mmol/L (ref 135–146)
Total Bilirubin: 0.4 mg/dL (ref 0.2–1.2)
Total Protein: 7.1 g/dL (ref 6.1–8.1)

## 2020-07-20 LAB — URINALYSIS, ROUTINE W REFLEX MICROSCOPIC
Bilirubin Urine: NEGATIVE
Glucose, UA: NEGATIVE
Hgb urine dipstick: NEGATIVE
Ketones, ur: NEGATIVE
Leukocytes,Ua: NEGATIVE
Nitrite: NEGATIVE
Protein, ur: NEGATIVE
Specific Gravity, Urine: 1.025 (ref 1.001–1.035)
pH: <=5 (ref 5.0–8.0)

## 2020-07-20 LAB — MAGNESIUM: Magnesium: 2.2 mg/dL (ref 1.5–2.5)

## 2020-07-20 LAB — TSH: TSH: 1.97 mIU/L (ref 0.40–4.50)

## 2020-07-20 LAB — INSULIN, RANDOM: Insulin: 46.9 u[IU]/mL — ABNORMAL HIGH

## 2020-07-20 LAB — IRON, TOTAL/TOTAL IRON BINDING CAP
%SAT: 17 % (calc) — ABNORMAL LOW (ref 20–48)
Iron: 52 ug/dL (ref 50–180)
TIBC: 307 mcg/dL (calc) (ref 250–425)

## 2020-07-20 LAB — TESTOSTERONE: Testosterone: 140 ng/dL — ABNORMAL LOW (ref 250–827)

## 2020-07-20 LAB — PSA: PSA: 2.13 ng/mL (ref <=4.00)

## 2020-07-20 LAB — VITAMIN D 25 HYDROXY (VIT D DEFICIENCY, FRACTURES): Vit D, 25-Hydroxy: 81 ng/mL (ref 30–100)

## 2020-07-20 LAB — VITAMIN B12: Vitamin B-12: 351 pg/mL (ref 200–1100)

## 2020-07-21 ENCOUNTER — Other Ambulatory Visit: Payer: Self-pay | Admitting: Internal Medicine

## 2020-07-21 NOTE — Progress Notes (Signed)
Pt was called to discuss his lab results. Pt did not answer the phone . A VM was left for him to call the office on 07/25/20. DG/CMA

## 2020-07-23 ENCOUNTER — Encounter: Payer: Self-pay | Admitting: Internal Medicine

## 2020-07-25 ENCOUNTER — Other Ambulatory Visit: Payer: Self-pay

## 2020-07-25 DIAGNOSIS — Z0001 Encounter for general adult medical examination with abnormal findings: Secondary | ICD-10-CM

## 2020-07-26 ENCOUNTER — Other Ambulatory Visit: Payer: Self-pay

## 2020-07-27 ENCOUNTER — Telehealth: Payer: Self-pay | Admitting: Internal Medicine

## 2020-07-27 NOTE — Telephone Encounter (Signed)
Patient given labs verbal by this office. Patient requested labs mailed and My Chart reset. Called and spoke w/ wife to confirm text number to send My Chart request. My Chart sent , mailed labs, & AVS. No further questions at this time.

## 2020-08-14 ENCOUNTER — Other Ambulatory Visit: Payer: Self-pay

## 2020-08-14 DIAGNOSIS — I493 Ventricular premature depolarization: Secondary | ICD-10-CM

## 2020-08-14 MED ORDER — VERAPAMIL HCL ER 240 MG PO TBCR: EXTENDED_RELEASE_TABLET | ORAL | 2 refills | Status: DC

## 2020-08-15 ENCOUNTER — Other Ambulatory Visit: Payer: Self-pay

## 2020-08-15 DIAGNOSIS — Z79899 Other long term (current) drug therapy: Secondary | ICD-10-CM

## 2020-08-15 DIAGNOSIS — Z1211 Encounter for screening for malignant neoplasm of colon: Secondary | ICD-10-CM

## 2020-08-15 LAB — POC HEMOCCULT BLD/STL (HOME/3-CARD/SCREEN)
Card #2 Fecal Occult Blod, POC: NEGATIVE
Card #3 Fecal Occult Blood, POC: NEGATIVE
Fecal Occult Blood, POC: NEGATIVE

## 2020-09-16 ENCOUNTER — Other Ambulatory Visit: Payer: Self-pay | Admitting: Internal Medicine

## 2020-09-16 DIAGNOSIS — I10 Essential (primary) hypertension: Secondary | ICD-10-CM

## 2020-10-27 ENCOUNTER — Other Ambulatory Visit: Payer: Self-pay | Admitting: Internal Medicine

## 2020-10-27 DIAGNOSIS — E1169 Type 2 diabetes mellitus with other specified complication: Secondary | ICD-10-CM

## 2020-11-08 ENCOUNTER — Other Ambulatory Visit: Payer: Self-pay

## 2020-11-08 ENCOUNTER — Encounter (HOSPITAL_BASED_OUTPATIENT_CLINIC_OR_DEPARTMENT_OTHER): Payer: Self-pay

## 2020-11-08 ENCOUNTER — Emergency Department (HOSPITAL_BASED_OUTPATIENT_CLINIC_OR_DEPARTMENT_OTHER)
Admission: EM | Admit: 2020-11-08 | Discharge: 2020-11-08 | Disposition: A | Discharge status: Home / Self Care | Payer: 59 | Attending: Emergency Medicine | Admitting: Emergency Medicine

## 2020-11-08 ENCOUNTER — Emergency Department (HOSPITAL_BASED_OUTPATIENT_CLINIC_OR_DEPARTMENT_OTHER): Payer: 59

## 2020-11-08 DIAGNOSIS — J449 Chronic obstructive pulmonary disease, unspecified: Secondary | ICD-10-CM | POA: Diagnosis not present

## 2020-11-08 DIAGNOSIS — Z7984 Long term (current) use of oral hypoglycemic drugs: Secondary | ICD-10-CM | POA: Insufficient documentation

## 2020-11-08 DIAGNOSIS — N182 Chronic kidney disease, stage 2 (mild): Secondary | ICD-10-CM | POA: Insufficient documentation

## 2020-11-08 DIAGNOSIS — Z79899 Other long term (current) drug therapy: Secondary | ICD-10-CM | POA: Diagnosis not present

## 2020-11-08 DIAGNOSIS — E1140 Type 2 diabetes mellitus with diabetic neuropathy, unspecified: Secondary | ICD-10-CM | POA: Insufficient documentation

## 2020-11-08 DIAGNOSIS — R0789 Other chest pain: Secondary | ICD-10-CM | POA: Insufficient documentation

## 2020-11-08 DIAGNOSIS — F17210 Nicotine dependence, cigarettes, uncomplicated: Secondary | ICD-10-CM | POA: Insufficient documentation

## 2020-11-08 DIAGNOSIS — E1122 Type 2 diabetes mellitus with diabetic chronic kidney disease: Secondary | ICD-10-CM | POA: Insufficient documentation

## 2020-11-08 DIAGNOSIS — I129 Hypertensive chronic kidney disease with stage 1 through stage 4 chronic kidney disease, or unspecified chronic kidney disease: Secondary | ICD-10-CM | POA: Insufficient documentation

## 2020-11-08 DIAGNOSIS — Z7982 Long term (current) use of aspirin: Secondary | ICD-10-CM | POA: Diagnosis not present

## 2020-11-08 MED ORDER — DICLOFENAC SODIUM 1 % EX GEL: 4.0000 g | Freq: Four times a day (QID) | CUTANEOUS | 0 refills | Status: DC

## 2020-11-08 NOTE — Discharge Instructions (Signed)
Use the gel as prescribed.  Also take tylenol 1000mg (2 extra strength) four times a day.   Return for worsening difficulty breathing, fever.

## 2020-11-08 NOTE — ED Provider Notes (Signed)
[facility] EMERGENCY DEPARTMENT Provider Note   CSN: [id number] Arrival date & time: 11/08/20  1201     History Chief Complaint  Patient presents with   Fall    Left rib pain    [name] is a 60 y.o. male.  60 yo M with a chief complaints of left-sided rib pain.  The patient fell about 4 days ago.  Been having some pain with different positioning deep breathing coughing.  Pain is worse when he lies back flat to go to bed and so has been sleeping in a chair.  He denies fever denies difficulty breathing.  Tells me that his wife made him come.  The history is provided by the patient.  Fall This is a new problem. The current episode started 2 days ago. The problem occurs constantly. The problem has not changed since onset.Associated symptoms include chest pain. Pertinent negatives include no abdominal pain, no headaches and no shortness of breath. The symptoms are aggravated by bending and twisting. Nothing relieves the symptoms. He has tried nothing for the symptoms. The treatment provided no relief.      Past Medical History:  Diagnosis Date   Diabetic neuropathy ([name])    Diverticulitis    History of hepatitis C    has been treated in the past   History of kidney stones    Hyperlipidemia    Hypertension    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[name])    Other testicular hypofunction    Severe sepsis ([name]) 08/26/2019   Type II or unspecified type diabetes mellitus without mention of complication, not stated as uncontrolled    Vitamin D deficiency     Patient Active Problem List   Diagnosis Date Noted   Right below-knee amputee ([name]) 09/03/2019   Hyponatremia 08/26/2019   Cutaneous abscess of right foot    Poorly controlled diabetes mellitus ([name]) 07/16/2019   FHx: heart disease 10/08/2017   Type 2 diabetes mellitus with stage 2 chronic kidney disease, without long-term current use of insulin ([name]) 10/08/2017   Insomnia 07/08/2017   Smoker 08/17/2015   COPD  (chronic obstructive pulmonary disease) ([name]) 08/17/2015   T2_NIDDM w/Peripheral Neuropathy 04/01/2014   CKD stage 2 due to type 2 diabetes mellitus ([name]) 08/31/2013   Medication management 05/19/2013   Morbid obesity (BMI 35) 05/19/2013   Essential hypertension    Hyperlipidemia associated with type 2 diabetes mellitus ([name])    Vitamin D deficiency    History of kidney stones    Testosterone deficiency    History of hepatitis C     Past Surgical History:  Procedure Laterality Date   AMPUTATION Right 09/01/2019   Procedure: RIGHT BELOW KNEE AMPUTATION;  Surgeon: Nadara Mustard, MD;  Location: [facility] OR;  Service: Orthopedics;  Laterality: Right;   CHOLECYSTECTOMY  1987   COLONOSCOPY     I & D EXTREMITY Right 08/20/2019   Procedure: IRRIGATION & DEBRIDEMENT RIGHT FOOT PARTIAL CALCANEAL EXCISION;  Surgeon: Nadara Mustard, MD;  Location: [facility] OR;  Service: Orthopedics;  Laterality: Right;   I & D EXTREMITY Right 08/27/2019   Procedure: IRRIGATION AND DEBRIDEMENT  OF FOOT;  Surgeon: Tarry Kos, MD;  Location: [facility] OR;  Service: Orthopedics;  Laterality: Right;   ORIF TIBIA FRACTURE     x 3 right leg       Family History  Problem Relation Age of Onset   Hypertension Mother    Cancer Father  colon   Alzheimer's disease Father    Stroke Father     Social History   Tobacco Use   Smoking status: Every Day    Packs/day: 1.00    Types: Cigarettes   Smokeless tobacco: Never  Vaping Use   Vaping Use: Never used  Substance Use Topics   Alcohol use: No   Drug use: Yes    Types: Marijuana    Home Medications Prior to Admission medications   Medication Sig Start Date End Date Taking? Authorizing Provider  diclofenac Sodium (VOLTAREN) 1 % GEL Apply 4 g topically 4 (four) times daily. 11/08/20  Yes Melene Plan, DO  aspirin 81 MG tablet Take 81 mg by mouth daily.    [provider]  citalopram (CELEXA) 40 MG tablet Take  1 tablet  Daily  for Mood & Chronic Anxiety 06/20/20    Lucky Cowboy, MD  Continuous Blood Gluc Sensor (FREESTYLE LIBRE SENSOR SYSTEM) MISC 1 each by Does not apply route daily. 09/30/19   Raulkar, Drema Pry, MD  glipiZIDE (GLUCOTROL) 5 MG tablet Take  1 tablet  Daily  with Breakfast  for Diabetes 07/22/20   Lucky Cowboy, MD  hydrochlorothiazide (HYDRODIURIL) 25 MG tablet Take  1 tablet  Daily  for BP & Fluid Retention / Ankle Swelling 09/16/20   Lucky Cowboy, MD  lisinopril (ZESTRIL) 20 MG tablet Take 1 tablet Daily for BP & Kidney Protection 09/16/20   Lucky Cowboy, MD  metFORMIN (GLUCOPHAGE-XR) 500 MG 24 hr tablet Take  2 tablets  2 x /day  with Meals  for Diabetes                    /   TAKE 2 TABLETS BY MOUTH TWICE DAILY WITH MEALS FOR DIABETES 10/27/20   Lucky Cowboy, MD  rosuvastatin (CRESTOR) 40 MG tablet Take  1 tablet  Daily   for Cholesterol                   /    TAKE 1 TABLET BY MOUTH ONCE DAILY FOR CHOLESTEROL 10/27/20   Lucky Cowboy, MD  verapamil (CALAN-SR) 240 MG CR tablet Take 1 tablet Daily with Food for BP & Heart  Rhythm 08/14/20   Lucky Cowboy, MD    Allergies    Invokamet [canagliflozin-metformin hcl], Invokana [canagliflozin], Codeine camsylate [codeine], and Morphine and related  Review of Systems   Review of Systems  Constitutional:  Negative for chills and fever.  HENT:  Negative for congestion and facial swelling.   Eyes:  Negative for discharge and visual disturbance.  Respiratory:  Positive for cough. Negative for shortness of breath.   Cardiovascular:  Positive for chest pain. Negative for palpitations.  Gastrointestinal:  Negative for abdominal pain, diarrhea and vomiting.  Musculoskeletal:  Negative for arthralgias and myalgias.  Skin:  Negative for color change and rash.  Neurological:  Negative for tremors, syncope and headaches.  Psychiatric/Behavioral:  Negative for confusion and dysphoric mood.    Physical Exam Updated Vital Signs BP (!) 145/85 (BP Location: Left Arm)   Pulse 77   Temp  98.4 F (36.9 C) (Oral)   Resp 20   Ht 6\' 3"  (1.905 m)   Wt 120.2 kg   SpO2 97%   BMI 33.12 kg/m   Physical Exam Vitals and nursing note reviewed.  Constitutional:      Appearance: He is well-developed.  HENT:     Head: Normocephalic and atraumatic.  Eyes:  Pupils: Pupils are equal, round, and reactive to light.  Neck:     Vascular: No JVD.  Cardiovascular:     Rate and Rhythm: Normal rate and regular rhythm.     Heart sounds: No murmur heard.   No friction rub. No gallop.  Pulmonary:     Effort: No respiratory distress.     Breath sounds: No wheezing.  Abdominal:     General: There is no distension.     Tenderness: There is no abdominal tenderness. There is no guarding or rebound.  Musculoskeletal:        General: Tenderness present. Normal range of motion.     Cervical back: Normal range of motion and neck supple.     Comments: Pain along the left lateral rib margin.  Worst about ribs 8 through 10.  Clear lung sounds.  Skin:    Coloration: Skin is not pale.     Findings: No rash.  Neurological:     Mental Status: He is alert and oriented to person, place, and time.  Psychiatric:        Behavior: Behavior normal.    ED Results / Procedures / Treatments   Labs (all labs ordered are listed, but only abnormal results are displayed) Labs Reviewed - No data to display  EKG None  Radiology DG Ribs Unilateral W/Chest Left  Result Date: 11/08/2020 CLINICAL DATA:  Fall, left rib pain EXAM: LEFT RIBS AND CHEST - 3+ VIEW COMPARISON:  08/26/2019 FINDINGS: No fracture or other bone lesions are seen involving the ribs. There is no evidence of pneumothorax or pleural effusion. Both lungs are clear. Heart size and mediastinal contours are within normal limits. IMPRESSION: Negative. Electronically Signed   By: Duanne Guess D.O.   On: 11/08/2020 13:15    Procedures Procedures   Medications Ordered in ED Medications - No data to display  ED Course  I have reviewed  the triage vital signs and the nursing notes.  Pertinent labs & imaging results that were available during my care of the patient were reviewed by me and considered in my medical decision making (see chart for details).    MDM Rules/Calculators/A&P                           60 yo M with a chief complaints of left-sided rib pain.  This is after a fall about 4 days ago.  Patient is well-appearing and nontoxic.  He is clear lung sounds for me.  Chest x-ray viewed by me without pneumothorax no obvious rib fracture.  I discussed conservative therapy at home.  He has an incentive spirometer that he plans to use to minutes at every hour he is awake.  I offered pain medicine which she is declining.  We will have him take Tylenol around-the-clock and diclofenac gel.  Pillow splinting discussed.  2:18 PM:  I have discussed the diagnosis/risks/treatment options with the patient and believe the pt to be eligible for discharge home to follow-up with PCP. We also discussed returning to the ED immediately if new or worsening sx occur. We discussed the sx which are most concerning (e.g., sudden worsening pain, fever, inability to tolerate by mouth) that necessitate immediate return. Medications administered to the patient during their visit and any new prescriptions provided to the patient are listed below.  Medications given during this visit Medications - No data to display   The patient appears reasonably screen and/or stabilized for discharge  and I doubt any other medical condition or other [facility] requiring further screening, evaluation, or treatment in the ED at this time prior to discharge.   Final Clinical Impression(s) / ED Diagnoses Final diagnoses:  Acute chest wall pain    Rx / DC Orders ED Discharge Orders          Ordered    diclofenac Sodium (VOLTAREN) 1 % GEL  4 times daily        11/08/20 1412             Melene Plan, DO 11/08/20 1418

## 2020-11-08 NOTE — ED Triage Notes (Signed)
Pt reports he lost his balance/fell 10/14-pain to left rib area-pain worse with deep breaths and movement-DOE noted-steady gait

## 2020-11-26 ENCOUNTER — Other Ambulatory Visit: Payer: Self-pay | Admitting: Internal Medicine

## 2020-11-26 MED ORDER — CITALOPRAM HYDROBROMIDE 40 MG PO TABS: ORAL_TABLET | ORAL | 3 refills | Status: DC

## 2021-01-18 ENCOUNTER — Encounter: Payer: Self-pay | Admitting: Internal Medicine

## 2021-01-18 ENCOUNTER — Other Ambulatory Visit: Payer: Self-pay

## 2021-01-18 ENCOUNTER — Ambulatory Visit: Payer: 59 | Admitting: Internal Medicine

## 2021-01-18 VITALS — BP 118/68 | HR 70 | Temp 97.9°F | Resp 16 | Ht 74.0 in | Wt 268.4 lb

## 2021-01-18 DIAGNOSIS — E1169 Type 2 diabetes mellitus with other specified complication: Secondary | ICD-10-CM

## 2021-01-18 DIAGNOSIS — E1140 Type 2 diabetes mellitus with diabetic neuropathy, unspecified: Secondary | ICD-10-CM

## 2021-01-18 DIAGNOSIS — Z89511 Acquired absence of right leg below knee: Secondary | ICD-10-CM

## 2021-01-18 DIAGNOSIS — I10 Essential (primary) hypertension: Secondary | ICD-10-CM | POA: Diagnosis not present

## 2021-01-18 DIAGNOSIS — E559 Vitamin D deficiency, unspecified: Secondary | ICD-10-CM

## 2021-01-18 DIAGNOSIS — E1122 Type 2 diabetes mellitus with diabetic chronic kidney disease: Secondary | ICD-10-CM | POA: Diagnosis not present

## 2021-01-18 DIAGNOSIS — Z79899 Other long term (current) drug therapy: Secondary | ICD-10-CM

## 2021-01-18 DIAGNOSIS — N182 Chronic kidney disease, stage 2 (mild): Secondary | ICD-10-CM

## 2021-01-18 DIAGNOSIS — E1149 Type 2 diabetes mellitus with other diabetic neurological complication: Secondary | ICD-10-CM

## 2021-01-18 DIAGNOSIS — E785 Hyperlipidemia, unspecified: Secondary | ICD-10-CM

## 2021-01-18 NOTE — Progress Notes (Addendum)
Future Appointments  Date Time Provider Department  01/18/2021    6 mo OV  4:30 PM Lucky Cowboy, MD [facility]  07/19/2021         CPE  3:00 PM Lucky Cowboy, MD [facility]    History of Present Illness:       This very nice 60 y.o. MWM presents for 3 month follow up with HTN, HLD, T2_DM, and Vitamin D Deficiency.        Patient is treated for HTN (1990)  & BP has been controlled at home. Todays BP is at goal - 118/68. Patient has had no complaints of any cardiac type chest pain, palpitations, dyspnea Pollyann Kennedy /PND, dizziness, claudication, or dependent edema.       Hyperlipidemia is controlled with diet & meds. Patient denies myalgias or other med SEs. Last Lipids were at goal except elevated Trig's :   Lab Results  Component Value Date   CHOL 142 07/19/2020   HDL 31 (L) 07/19/2020   LDLCALC 75 07/19/2020   TRIG 274 (H) 07/19/2020   CHOLHDL 4.6 07/19/2020     Also, the patient has history of T2_NIDDM PreDiabetes and has had no symptoms of reactive hypoglycemia, diabetic polys, paresthesias or visual blurring.  Last A1c was not at goal:  Lab Results  Component Value Date   HGBA1C 6.2 (H) 07/19/2020                                                           Further, the patient also has history of Vitamin D Deficiency ("24" /2008) and supplements vitamin D without any suspected side-effects. Last vitamin D was at goal :  Lab Results  Component Value Date   VD25OH 16 07/19/2020     Current Outpatient Medications on File Prior to Visit  Medication Sig   aspirin 81 MG tablet Take daily.   Celexa 40 mg  Takes 1 tablet Daily    diclofenac  1 % GEL Apply 4 gms topicatty                                                                                                                      glipiZIDE  5 MG tablet    hydrochlorothiazide 25 MG tablet Take  1 tablet  Daily    lisinopril  20 MG tablet Take 1 tablet Daily    Metformin 500 MG 24 hr tablet Take  2 tablets   2 x /day    rosuvastatin  40 MG tablet Take  1 tablet  Daily                     verapamil -SR 240 MG CR tablet Take 1 tablet Daily     Allergies  Allergen Reactions   Invokamet [Canagliflozin-Metformin Hcl] Other (See Comments)  Extremity edema/caused pain   Invokana [Canagliflozin] Other (See Comments)    Extremity edema/caused pain   Codeine Camsylate [Codeine] Rash   Morphine And Related Rash    PMHx:   Past Medical History:  Diagnosis Date   Diabetic neuropathy ([place])    Diverticulitis    History of hepatitis C    has been treated in the past   History of kidney stones    Hyperlipidemia    Hypertension    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[place])    Other testicular hypofunction    Severe sepsis ([place]) 08/26/2019   Type II or unspecified type diabetes mellitus without mention of complication, not stated as uncontrolled    Vitamin D deficiency      Immunization History  Administered Date(s) Administered   PPD Test 08/31/2013, 11/08/2014, 04/03/2017, 07/14/2018, 07/19/2019   Td 01/21/2005   Tdap 12/25/2016     Past Surgical History:  Procedure Laterality Date   AMPUTATION Right 09/01/2019   Procedure: RIGHT BELOW KNEE AMPUTATION;  Surgeon: Newt Minion, MD;  Location: [facility];  Service: Orthopedics;  Laterality: Right;   CHOLECYSTECTOMY  1987   COLONOSCOPY     I & D EXTREMITY Right 08/20/2019   Procedure: IRRIGATION & DEBRIDEMENT RIGHT FOOT PARTIAL CALCANEAL EXCISION;  Surgeon: Newt Minion, MD;  Location: [facility];  Service: Orthopedics;  Laterality: Right;   I & D EXTREMITY Right 08/27/2019   Procedure: IRRIGATION AND DEBRIDEMENT  OF FOOT;  Surgeon: Leandrew Koyanagi, MD;  Location: [facility];  Service: Orthopedics;  Laterality: Right;   ORIF TIBIA FRACTURE     x 3 right leg    FHx:    Reviewed / unchanged  SHx:    Reviewed / unchanged   Systems Review:  Constitutional: Denies fever, chills, wt changes, headaches, insomnia, fatigue, night sweats, change in appetite. Eyes:  Denies redness, blurred vision, diplopia, discharge, itchy, watery eyes.  ENT: Denies discharge, congestion, post nasal drip, epistaxis, sore throat, earache, hearing loss, dental pain, tinnitus, vertigo, sinus pain, snoring.  CV: Denies chest pain, palpitations, irregular heartbeat, syncope, dyspnea, diaphoresis, orthopnea, PND, claudication or edema. Respiratory: denies cough, dyspnea, DOE, pleurisy, hoarseness, laryngitis, wheezing.  Gastrointestinal: Denies dysphagia, odynophagia, heartburn, reflux, water brash, abdominal pain or cramps, nausea, vomiting, bloating, diarrhea, constipation, hematemesis, melena, hematochezia  or hemorrhoids. Genitourinary: Denies dysuria, frequency, urgency, nocturia, hesitancy, discharge, hematuria or flank pain. Musculoskeletal: Denies arthralgias, myalgias, stiffness, jt. swelling, pain, limping or strain/sprain.  Skin: Denies pruritus, rash, hives, warts, acne, eczema or change in skin lesion(s). Neuro: No weakness, tremor, incoordination, spasms, paresthesia or pain. Psychiatric: Denies confusion, memory loss or sensory loss. Endo: Denies change in weight, skin or hair change.  Heme/Lymph: No excessive bleeding, bruising or enlarged lymph nodes.  Physical Exam  BP 118/68    Pulse 70    Temp 97.9 F (36.6 C)    Resp 16    Ht 6\' 2"  (1.88 m)    Wt 268 lb 6.4 oz (121.7 kg)    SpO2 96%    BMI 34.46 kg/m   Appears  well nourished, well groomed  and in no distress.  Eyes: PERRLA, EOMs, conjunctiva no swelling or erythema. Sinuses: No frontal/maxillary tenderness ENT/Mouth: EAC's clear, TM's nl w/o erythema, bulging. Nares clear w/o erythema, swelling, exudates. Oropharynx clear without erythema or exudates. Oral hygiene is good. Tongue normal, non obstructing. Hearing intact.  Neck: Supple. Thyroid not palpable. Car 2+/2+ without bruits, nodes or JVD. Chest: Respirations nl with BS clear &  equal w/o rales, rhonchi, wheezing or stridor.  Cor: Heart sounds  normal w/ regular rate and rhythm without sig. murmurs, gallops, clicks or rubs. Peripheral pulses normal and equal  without edema.  Abdomen: Soft & bowel sounds normal. Non-tender w/o guarding, rebound, hernias, masses or organomegaly.  Lymphatics: Unremarkable.  Musculoskeletal:  Rt BKA prosthesis. Normal gait.  Skin: Warm, dry without exposed rashes, lesions or ecchymosis apparent.  Neuro: Cranial nerves intact, reflexes equal bilaterally. Sensory-motor testing grossly intact. Tendon reflexes grossly intact.  Pysch: Alert & oriented x 3.  Insight and judgement nl & appropriate. No ideations.  Assessment and Plan:  1. Essential hypertension  - Continue medication, monitor blood pressure at home.  - Continue DASH diet.  Reminder to go to the ER if any CP,  SOB, nausea, dizziness, severe HA, changes vision/speech.               - CBC with Differential/Platelet - COMPLETE METABOLIC PANEL WITH GFR - Magnesium - TSH  2. Hyperlipidemia associated with type 2 diabetes mellitus ([place])  - Continue diet/meds, exercise,& lifestyle modifications.  - Continue monitor periodic cholesterol/liver & renal functions                                                                                                                                                                                                                                                                                                                                                                                                             - Lipid panel - TSH  3. 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 Vitamin D deficiency  - VITAMIN D 25 Hydroxy  5. T2_NIDDM w/Peripheral Neuropathy   6. Right below-knee amputee ([place])   7. Medication management  - CBC with Differential/Platelet - COMPLETE METABOLIC PANEL WITH  GFR - Magnesium - Lipid panel - TSH - Hemoglobin A1c - Insulin, random - VITAMIN D 25 Hydroxy            Discussed  regular exercise, BP monitoring, weight control to achieve/maintain BMI less than 25 and discussed med and SE's. Recommended labs to assess and monitor clinical status with further disposition pending results of labs.  I discussed the assessment and treatment plan with the patient. The patient was provided an opportunity to ask questions and all were answered. The patient agreed with the plan and demonstrated an understanding of the instructions.  I provided over 30 minutes of exam, counseling, chart review and  complex critical decision making.        The patient was advised to call back or seek an in-person evaluation if the symptoms worsen or if the condition fails to improve as anticipated.   Kirtland Bouchard, MD

## 2021-01-18 NOTE — Patient Instructions (Signed)

## 2021-01-19 LAB — COMPLETE METABOLIC PANEL WITH GFR
AG Ratio: 1.3 (calc) (ref 1.0–2.5)
ALT: 26 U/L (ref 9–46)
AST: 20 U/L (ref 10–35)
Albumin: 4 g/dL (ref 3.6–5.1)
Alkaline phosphatase (APISO): 76 U/L (ref 35–144)
BUN: 20 mg/dL (ref 7–25)
CO2: 25 mmol/L (ref 20–32)
Calcium: 9.5 mg/dL (ref 8.6–10.3)
Chloride: 102 mmol/L (ref 98–110)
Creat: 0.82 mg/dL (ref 0.70–1.35)
Globulin: 3.1 g/dL (calc) (ref 1.9–3.7)
Glucose, Bld: 151 mg/dL — ABNORMAL HIGH (ref 65–139)
Potassium: 4.5 mmol/L (ref 3.5–5.3)
Sodium: 138 mmol/L (ref 135–146)
Total Bilirubin: 0.5 mg/dL (ref 0.2–1.2)
Total Protein: 7.1 g/dL (ref 6.1–8.1)
eGFR: 99 mL/min/{1.73_m2} (ref >=60)

## 2021-01-19 LAB — CBC WITH DIFFERENTIAL/PLATELET
Absolute Monocytes: 918 cells/uL (ref 200–950)
Basophils Absolute: 76 cells/uL (ref 0–200)
Basophils Relative: 0.7 %
Eosinophils Absolute: 130 cells/uL (ref 15–500)
Eosinophils Relative: 1.2 %
HCT: 45.5 % (ref 38.5–50.0)
Hemoglobin: 15.8 g/dL (ref 13.2–17.1)
Lymphs Abs: 4439 cells/uL — ABNORMAL HIGH (ref 850–3900)
MCH: 32.2 pg (ref 27.0–33.0)
MCHC: 34.7 g/dL (ref 32.0–36.0)
MCV: 92.9 fL (ref 80.0–100.0)
MPV: 12 fL (ref 7.5–12.5)
Monocytes Relative: 8.5 %
Neutro Abs: 5238 cells/uL (ref 1500–7800)
Neutrophils Relative %: 48.5 %
Platelets: 189 10*3/uL (ref 140–400)
RBC: 4.9 10*6/uL (ref 4.20–5.80)
RDW: 13.1 % (ref 11.0–15.0)
Total Lymphocyte: 41.1 %
WBC: 10.8 10*3/uL (ref 3.8–10.8)

## 2021-01-19 LAB — HEMOGLOBIN A1C
Hgb A1c MFr Bld: 8.6 % of total Hgb — ABNORMAL HIGH (ref <5.7)
Mean Plasma Glucose: 200 mg/dL
eAG (mmol/L): 11.1 mmol/L

## 2021-01-19 LAB — LIPID PANEL
Cholesterol: 124 mg/dL (ref <200)
HDL: 33 mg/dL — ABNORMAL LOW (ref >=40)
LDL Cholesterol (Calc): 65 mg/dL (calc)
Non-HDL Cholesterol (Calc): 91 mg/dL (calc) (ref <130)
Total CHOL/HDL Ratio: 3.8 (calc) (ref <5.0)
Triglycerides: 191 mg/dL — ABNORMAL HIGH (ref <150)

## 2021-01-19 LAB — VITAMIN D 25 HYDROXY (VIT D DEFICIENCY, FRACTURES): Vit D, 25-Hydroxy: 67 ng/mL (ref 30–100)

## 2021-01-19 LAB — TSH: TSH: 3.01 mIU/L (ref 0.40–4.50)

## 2021-01-19 LAB — INSULIN, RANDOM: Insulin: 46 u[IU]/mL — ABNORMAL HIGH

## 2021-01-19 LAB — MAGNESIUM: Magnesium: 2.2 mg/dL (ref 1.5–2.5)

## 2021-01-20 NOTE — Progress Notes (Signed)
============================================================ °============================================================ ° °-    Total Chol = 124   -  Excellent   - Very low risk for Heart Attack  / Stroke ============================================================ ============================================================  -  A1c  - WORSE - Gone from 6.2% to Now   8.6%  = Average glucoses of 200 mg% and higher  !   Being diabetic has a  300% increased risk for heart attack,  stroke, cancer, and alzheimer- type vascular dementia.   It is very important that you work harder with diet by  avoiding all foods that are white except chicken,   fish & calliflower.  - Avoid white rice  (brown & wild rice is OK),   - Avoid white potatoes  (sweet potatoes in moderation is OK),   White bread or wheat bread or anything made out of   white flour like bagels, donuts, rolls, buns, biscuits, cakes,  - pastries, cookies, pizza crust, and pasta (made from  white flour & egg whites)   - vegetarian pasta or spinach or wheat pasta is OK.  - Multigrain breads like Arnold's, Pepperidge Farm or   multigrain sandwich thins or high fiber breads like   [place] bread or "Dave's Killer" breads that are  4 to 5 grams fiber per slice !  are best.    Diet, exercise and weight loss can reverse and cure  diabetes in the early stages.    - Diet, exercise and weight loss is very important in the   control and prevention of complications of diabetes which  affects every system in your body, ie.   -Brain - dementia/stroke,  - eyes - glaucoma/blindness,  - heart - heart attack/heart failure,  - kidneys - dialysis,  - stomach - gastric paralysis,  - intestines - malabsorption,  - nerves - severe painful neuritis,  - circulation - gangrene & loss of a leg(s)  - and finally  . . . . . . . . . . . . . . . . . .    - cancer and  Alzheimers. ============================================================ ============================================================  -  Vitamin D = 67 - Excellent  !      Please keep dose same  ============================================================ ============================================================  -  All Else - CBC - Kidneys - Electrolytes - Liver - Magnesium & Thyroid    - all  Normal / OK ============================================================ ============================================================

## 2021-01-29 ENCOUNTER — Other Ambulatory Visit: Payer: Self-pay

## 2021-01-29 DIAGNOSIS — E1122 Type 2 diabetes mellitus with diabetic chronic kidney disease: Secondary | ICD-10-CM

## 2021-01-29 DIAGNOSIS — E1169 Type 2 diabetes mellitus with other specified complication: Secondary | ICD-10-CM

## 2021-01-29 DIAGNOSIS — N182 Chronic kidney disease, stage 2 (mild): Secondary | ICD-10-CM

## 2021-01-29 MED ORDER — ROSUVASTATIN CALCIUM 40 MG PO TABS: ORAL_TABLET | ORAL | 3 refills | Status: DC

## 2021-01-29 MED ORDER — CITALOPRAM HYDROBROMIDE 40 MG PO TABS: ORAL_TABLET | ORAL | 3 refills | Status: DC

## 2021-01-29 MED ORDER — METFORMIN HCL ER 500 MG PO TB24: ORAL_TABLET | ORAL | 3 refills | Status: DC

## 2021-04-17 NOTE — Progress Notes (Addendum)
?FOLLOW UP 3 MONTH ? ?Assessment and Plan:  ? ?[name] was seen today for follow-up. ? ?Diagnoses and all orders for this visit: ? ?Essential Hypertension ?Continue lisinopril and verapamil and HCTZ 25 mg 1/2 tab daily ?Monitor blood pressure at home; patient to call if consistently greater than 130/80 ?Continue DASH diet.   ?Reminder to go to the ER if any CP, SOB, nausea, dizziness, severe HA, changes vision/speech, left arm numbness and tingling and jaw pain. ?-     CBC with Differential/Platelet ?-     COMPLETE METABOLIC PANEL WITH GFR ? ?Hyperlipidemia associated with type 2 diabetes mellitus ([name]) ?Taking rosuvastatin 40mg  daily ?Currently goal of LDL <70;  ?Continue low cholesterol diet and exercise.  ?-     Lipid panel ? ?Type 2 diabetes mellitus with stage 2 chronic kidney disease, without long-term current use of insulin ([name]) ?Discussed dietary and exercise modifications ?Continue Glucophage 500 mg 2 tab BID a ?Will d/c glipizide ?Would like to try Trulicity as blood sugars are still not controlled.  ?Keep blood sugar log daily and bring to next visit ?-     Hemoglobin A1c ? ?Vitamin D deficiency ?Continue supplementation to maintain goal of 60-100 ? ?Class 2 severe obesity  associated with comorbidity([name]) ?Long discussion about weight loss, diet, and exercise ?Recommended diet heavy in fruits and veggies and low in animal meats, cheeses, and dairy products, appropriate calorie intake ?Follow up at next visit  ? ? ?Tobacco use ?Discussed risks associated with tobacco use and advised to reduce or quit ?He is not currently ready ?Will follow up at the next visit ? ?COPD ?Advised to stop smoking - risks discussed. Declines medications.  ?Currently stable without respiratory medications ? ?R BTK amputation ([name]) ?Stump healed well; completed PT, well fitting prosthesis  ?He is in good spirits; has improved glucose control tremendously ? ?Audible heartbeat in left ear ?No carotid bruits heard ?Possible  eustachian tube dysfunction- try Mucinex daily for a week to see if symptoms improve ?If symptoms persist or worsen notify the office, may get carotid doppler ? ?Continue diet and meds as discussed. Further disposition pending results of labs. Discussed med's effects and SE's.   ?Over 30 minutes of face to face interview, exam, counseling, chart review, and critical decision making was performed.  ? ?Future Appointments  ?Date Time Provider [place]  ?04/18/2021 11:30 AM Aldonia Keeven, Townsend Roger, NP [facility] None  ?07/19/2021  3:00 PM [name], MD [facility] None  ? ? ?---------------------------------------------------------------------------------------------------------------------- ? ?HPI ?61 y.o. male  presents for 3 month follow up on hypertension, cholesterol, diabetes, weight and vitamin D deficiency. ? ?Poorly controlled diabetic for many years, unfortunately developed R foot abcess, underwent Right [name] on 09/01/2019 due to osteomyelitis of his right foot and sepsis. Prosthesis doing well. ? 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 ?He has been noticing for several weeks he hears his heart beat in his left ear.  Blood pressures have been controlled.  Denies headaches, dizziness, chest pain, shortness of breath.  ? ?He currently continues to smoke 1 pack a day x 45 years; Started smoking at age [age] discussed risks associated with smoking, patient is not ready to quit.  ?  ?BMI is Body mass index is 34.28 kg/m?., he has been working on diet and exercise, working with PT, watching portions, ?Wt Readings from Last 3 Encounters:  ?04/18/21 267 lb (121.1 kg)  ?01/18/21 268 lb 6.4 oz (121.7  kg)  ?11/08/20 265 lb (120.2 kg)  ? ?His blood pressure has been controlled at home, today their BP is BP: (!) 128/58  ?BP Readings from Last 3 Encounters:  ?04/18/21 (!) 128/58  ?01/18/21 118/68  ?11/08/20 (!) 145/85  ?  ? He does workout. He denies chest pain, shortness of  breath, dizziness. ? ? He is on cholesterol medication rosuvastatin 40mg  daily and denies myalgias. His cholesterol is at goal. The cholesterol last visit was:   ?Lab Results  ?Component Value Date  ? CHOL 124 01/18/2021  ? HDL 33 (L) 01/18/2021  ? [name] 63 01/18/2021  ? TRIG 191 (H) 01/18/2021  ? CHOLHDL 3.8 01/18/2021  ? ? He has been working on diet and exercise for T2 diabetes  ?Neuropathy in bil feet, some numbness and weakness in hands and feet, was on gabapentin but recently improved ?Hyperlipidemia- Rosuvastatin 40 mg ?Metformin 500mg  one in morning and two in evening. ?On Glipizide 5 mg, half tablet every other once daily with breakfast, denies hypoglycemia ?denies foot ulcerations, increased appetite, nausea, polydipsia, polyuria, visual disturbances, vomiting and weight loss.  ?He checks sugars occasionally, 140-230 ?Last A1C in the office was:  ?Lab Results  ?Component Value Date  ? HGBA1C 8.6 (H) 01/18/2021  ? ?Lab Results  ?Component Value Date  ? GFRNONAA 85 07/19/2020  ? ?Patient is on Vitamin D supplement and at goal at recent check:    ?Lab Results  ?Component Value Date  ? VD25OH 67 01/18/2021  ?   ?  ? ? ?Current Medications:  ?Current Outpatient Medications on File Prior to Visit  ?Medication Sig  ? aspirin 81 MG tablet Take 81 mg by mouth daily.  ? citalopram (CELEXA) 40 MG tablet Take 1 tablet Daily for Mood & Chronic Anxiety  ? glipiZIDE (GLUCOTROL) 5 MG tablet Take  1 tablet  Daily  with Breakfast  for Diabetes  ? hydrochlorothiazide (HYDRODIURIL) 25 MG tablet Take  1 tablet  Daily  for BP & Fluid Retention / Ankle Swelling  ? lisinopril (ZESTRIL) 20 MG tablet Take 1 tablet Daily for BP & Kidney Protection  ? metFORMIN (GLUCOPHAGE-XR) 500 MG 24 hr tablet Take 2 tablets 2 x /day with Meals  for Diabetes  ? rosuvastatin (CRESTOR) 40 MG tablet Take one tablet daily for Cholesterol.  ? verapamil (CALAN-SR) 240 MG CR tablet Take 1 tablet Daily with Food for BP & Heart  Rhythm  ? Continuous  Blood Gluc Sensor ([place]) MISC 1 each by Does not apply route daily.  ? diclofenac Sodium (VOLTAREN) 1 % GEL Apply 4 g topically 4 (four) times daily. (Patient not taking: Reported on 04/18/2021)  ? ?No current facility-administered medications on file prior to visit.  ? ? ? ?Allergies:  ?Allergies  ?Allergen Reactions  ? Invokamet [Canagliflozin-Metformin Hcl] Other (See Comments)  ?  Extremity edema/caused pain  ? Invokana [Canagliflozin] Other (See Comments)  ?  Extremity edema/caused pain  ? Codeine Camsylate [Codeine] Rash  ? Morphine And Related Rash  ?  ? ?Medical History:  ?Past Medical History:  ?Diagnosis Date  ? Diabetic neuropathy ([place])   ? Diverticulitis   ? History of hepatitis C   ? has been treated in the past  ? History of kidney stones   ? Hyperlipidemia   ? Hypertension   ? 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[place])   ? Other testicular hypofunction   ? Severe sepsis ([place]) 08/26/2019  ? Type II or unspecified type diabetes mellitus  without mention of complication, not stated as uncontrolled   ? Vitamin D deficiency   ? ?Family history- Reviewed and unchanged ?Social history- Reviewed and unchanged ? ? ?Review of Systems:  ?Review of Systems  ?Constitutional:  Negative for malaise/fatigue and weight loss.  ?HENT:  Negative for hearing loss and tinnitus.   ?Eyes:  Negative for blurred vision and double vision.  ?Respiratory:  Negative for cough, shortness of breath and wheezing.   ?Cardiovascular:  Negative for chest pain, palpitations, orthopnea, claudication and leg swelling.  ?Gastrointestinal:  Negative for abdominal pain, blood in stool, constipation, diarrhea, heartburn, melena, nausea and vomiting.  ?Genitourinary: Negative.   ?Musculoskeletal:  Negative for joint pain and myalgias.  ?Skin:  Negative for rash.  ?Neurological:  Negative for dizziness, tingling, sensory change, weakness and headaches.  ?Endo/Heme/Allergies:  Negative for polydipsia.  ?Psychiatric/Behavioral:   Negative for depression, hallucinations, memory loss and substance abuse. The patient is not nervous/anxious.   ?All other systems reviewed and are negative. ? ? ?Physical Exam: ?BP (!) 128/58   Pulse 70   Temp (!) 97.5 ?

## 2021-04-18 ENCOUNTER — Encounter: Payer: Self-pay | Admitting: Nurse Practitioner

## 2021-04-18 ENCOUNTER — Ambulatory Visit: Payer: 59 | Admitting: Nurse Practitioner

## 2021-04-18 ENCOUNTER — Other Ambulatory Visit: Payer: Self-pay

## 2021-04-18 VITALS — BP 128/58 | HR 70 | Temp 97.5°F | Wt 267.0 lb

## 2021-04-18 DIAGNOSIS — E785 Hyperlipidemia, unspecified: Secondary | ICD-10-CM

## 2021-04-18 DIAGNOSIS — Z79899 Other long term (current) drug therapy: Secondary | ICD-10-CM

## 2021-04-18 DIAGNOSIS — I10 Essential (primary) hypertension: Secondary | ICD-10-CM | POA: Diagnosis not present

## 2021-04-18 DIAGNOSIS — E1122 Type 2 diabetes mellitus with diabetic chronic kidney disease: Secondary | ICD-10-CM

## 2021-04-18 DIAGNOSIS — N182 Chronic kidney disease, stage 2 (mild): Secondary | ICD-10-CM

## 2021-04-18 DIAGNOSIS — J449 Chronic obstructive pulmonary disease, unspecified: Secondary | ICD-10-CM

## 2021-04-18 DIAGNOSIS — E1169 Type 2 diabetes mellitus with other specified complication: Secondary | ICD-10-CM

## 2021-04-18 DIAGNOSIS — Z6835 Body mass index (BMI) 35.0-35.9, adult: Secondary | ICD-10-CM

## 2021-04-18 DIAGNOSIS — H938X2 Other specified disorders of left ear: Secondary | ICD-10-CM

## 2021-04-18 DIAGNOSIS — Z89511 Acquired absence of right leg below knee: Secondary | ICD-10-CM

## 2021-04-18 MED ORDER — TRULICITY 0.75 MG/0.5ML ~~LOC~~ SOAJ: 0.7500 mg | SUBCUTANEOUS | 3 refills | Status: DC

## 2021-04-18 NOTE — Patient Instructions (Signed)
Stop Glipizide ?Begin Trulicity(dulaglutide) 0.75 mg once a week ?Check blood sugars daily and keep a log , bring to next visit. ? ?Dulaglutide Injection ?What is this medication? ?DULAGLUTIDE (DOO la GLOO tide) treats type 2 diabetes. It works by increasing insulin levels in your body, which decreases your blood sugar (glucose). It also reduces the amount of sugar released into your blood and slows down your digestion. It can also be used to lower the risk of heart attack and stroke in people with type 2 diabetes. Changes to diet and exercise are often combined with this medication. ?This medicine may be used for other purposes; ask your health care provider or pharmacist if you have questions. ?COMMON BRAND NAME(S): Trulicity ?What should I tell my care team before I take this medication? ?They need to know if you have any of these conditions: ?Endocrine tumors (MEN 2) or if someone in your family had these tumors ?Eye disease, vision problems ?History of pancreatitis ?Kidney disease ?Liver disease ?Stomach or intestine problems ?Thyroid cancer or if someone in your family had thyroid cancer ?An unusual or allergic reaction to dulaglutide, other medications, foods, dyes, or preservatives ?Pregnant or trying to get pregnant ?Breast-feeding ?How should I use this medication? ?This medication is injected under the skin. You will be taught how to prepare and give it. Take it as directed on the prescription label on the same day of each week. Do NOT prime the pen. Keep taking it unless your care team tells you to stop. ?If you use this medication with insulin, you should inject this medication and the insulin separately. Do not mix them together. Do not give the injections right next to each other. Change (rotate) injection sites with each injection. ?This medication comes with INSTRUCTIONS FOR USE. Ask your pharmacist for directions on how to use this medication. Read the information carefully. Talk to your pharmacist  or care team if you have questions. ?It is important that you put your used needles and syringes in a special sharps container. Do not put them in a trash can. If you do not have a sharps container, call your pharmacist or care team to get one. ?A special MedGuide will be given to you by the pharmacist with each prescription and refill. Be sure to read this information carefully each time. ?Talk to your care team about the use of this medication in children. Special care may be needed. ?Overdosage: If you think you have taken too much of this medicine contact a poison control center or emergency room at once. ?NOTE: This medicine is only for you. Do not share this medicine with others. ?What if I miss a dose? ?If you miss a dose, take it as soon as you can unless it is more than 3 days late. If it is more than 3 days late, skip the missed dose. Take the next dose at the normal time. ?What may interact with this medication? ?Other medications for diabetes ?Many medications may cause changes in blood sugar, these include: ?Alcohol containing beverages ?Antiviral medications for HIV or AIDS ?Aspirin and aspirin-like medications ?Certain medications for blood pressure, heart disease, irregular heart beat ?Chromium ?Diuretics ?Male hormones, such as estrogens or progestins, birth control pills ?Fenofibrate ?Gemfibrozil ?Isoniazid ?Lanreotide ?Male hormones or anabolic steroids ?MAOIs like Carbex, Eldepryl, Marplan, Nardil, and Parnate ?Medications for allergies, asthma, cold, or cough ?Medications for depression, anxiety, or psychotic disturbances ?Medications for weight loss ?Niacin ?Nicotine ?NSAIDs, medications for pain and inflammation, like ibuprofen or naproxen ?Octreotide ?  Pasireotide ?Pentamidine ?Phenytoin ?Probenecid ?Quinolone antibiotics such as ciprofloxacin, levofloxacin, ofloxacin ?Some herbal dietary supplements ?Steroid medications such as prednisone or cortisone ?Sulfamethoxazole;  trimethoprim ?Thyroid hormones ?Some medications can hide the warning symptoms of low blood sugar (hypoglycemia). You may need to monitor your blood sugar more closely if you are taking one of these medications. These include: ?Beta-blockers, often used for high blood pressure or heart problems (examples include atenolol, metoprolol, propranolol) ?Clonidine ?Guanethidine ?Reserpine ?This list may not describe all possible interactions. Give your health care provider a list of all the medicines, herbs, non-prescription drugs, or dietary supplements you use. Also tell them if you smoke, drink alcohol, or use illegal drugs. Some items may interact with your medicine. ?What should I watch for while using this medication? ?Visit your care team for regular checks on your progress. ?Check with your care team if you have severe diarrhea, nausea, and vomiting, or if you sweat a lot. The loss of too much body fluid may make it dangerous for you to take this medication. ?A test called the HbA1C (A1C) will be monitored. This is a simple blood test. It measures your blood sugar control over the last 2 to 3 months. You will receive this test every 3 to 6 months. ?Learn how to check your blood sugar. Learn the symptoms of low and high blood sugar and how to manage them. ?Always carry a quick-source of sugar with you in case you have symptoms of low blood sugar. Examples include hard sugar candy or glucose tablets. Make sure others know that you can choke if you eat or drink when you develop serious symptoms of low blood sugar, such as seizures or unconsciousness. Get medical help at once. ?Tell your care team if you have high blood sugar. You might need to change the dose of your medication. If you are sick or exercising more than usual, you may need to change the dose of your medication. ?Do not skip meals. Ask your care team if you should avoid alcohol. Many nonprescription cough and cold products contain sugar or alcohol. These  can affect blood sugar. ?Pens should never be shared. Even if the needle is changed, sharing may result in passing of viruses like hepatitis or HIV. ?Wear a medical ID bracelet or chain. Carry a card that describes your condition. List the medications and doses you take on the card. ?What side effects may I notice from receiving this medication? ?Side effects that you should report to your care team as soon as possible: ?Allergic reactions--skin rash, itching, hives, swelling of the face, lips, tongue, or throat ?Change in vision ?Dehydration--increased thirst, dry mouth, feeling faint or lightheaded, headache, dark yellow or brown urine ?Kidney injury--decrease in the amount of urine, swelling of the ankles, hands, or feet ?Pancreatitis--severe stomach pain that spreads to your back or gets worse after eating or when touched, fever, nausea, vomiting ?Thyroid cancer--new mass or lump in the neck, pain or trouble swallowing, trouble breathing, hoarseness ?Side effects that usually do not require medical attention (report to your care team if they continue or are bothersome): ?Diarrhea ?Loss of appetite ?Nausea ?Stomach pain ?Vomiting ?This list may not describe all possible side effects. Call your doctor for medical advice about side effects. You may report side effects to FDA at 1-800-FDA-1088. ?Where should I keep my medication? ?Keep out of the reach of children and pets. ?Refrigeration (preferred): Store unopened pens in a refrigerator between 2 and 8 degrees C (36 and 46 degrees F). Keep  it in the original carton until you are ready to take it. Do not freeze or use if the medication has been frozen. Protect from light. Get rid of any unused medication after the expiration date on the label. ?Room Temperature: The pen may be stored at room temperature below 30 degrees C (86 degrees F) for up to a total of 14 days if needed. Protect from light. Avoid exposure to extreme heat. If it is stored at room temperature,  throw away any unused medication after 14 days or after it expires, whichever is first. ?To get rid of medications that are no longer needed or have expired: ?Take the medication to a medication take-back program. Check

## 2021-04-19 LAB — CBC WITH DIFFERENTIAL/PLATELET
Absolute Monocytes: 915 cells/uL (ref 200–950)
Basophils Absolute: 79 cells/uL (ref 0–200)
Basophils Relative: 0.7 %
Eosinophils Absolute: 79 cells/uL (ref 15–500)
Eosinophils Relative: 0.7 %
HCT: 45.4 % (ref 38.5–50.0)
Hemoglobin: 15.3 g/dL (ref 13.2–17.1)
Lymphs Abs: 3605 cells/uL (ref 850–3900)
MCH: 31.9 pg (ref 27.0–33.0)
MCHC: 33.7 g/dL (ref 32.0–36.0)
MCV: 94.8 fL (ref 80.0–100.0)
MPV: 12 fL (ref 7.5–12.5)
Monocytes Relative: 8.1 %
Neutro Abs: 6622 cells/uL (ref 1500–7800)
Neutrophils Relative %: 58.6 %
Platelets: 201 10*3/uL (ref 140–400)
RBC: 4.79 10*6/uL (ref 4.20–5.80)
RDW: 12.8 % (ref 11.0–15.0)
Total Lymphocyte: 31.9 %
WBC: 11.3 10*3/uL — ABNORMAL HIGH (ref 3.8–10.8)

## 2021-04-19 LAB — LIPID PANEL
Cholesterol: 113 mg/dL (ref <200)
HDL: 30 mg/dL — ABNORMAL LOW (ref >=40)
LDL Cholesterol (Calc): 52 mg/dL (calc)
Non-HDL Cholesterol (Calc): 83 mg/dL (calc) (ref <130)
Total CHOL/HDL Ratio: 3.8 (calc) (ref <5.0)
Triglycerides: 262 mg/dL — ABNORMAL HIGH (ref <150)

## 2021-04-19 LAB — COMPLETE METABOLIC PANEL WITH GFR
AG Ratio: 1.5 (calc) (ref 1.0–2.5)
ALT: 24 U/L (ref 9–46)
AST: 18 U/L (ref 10–35)
Albumin: 4 g/dL (ref 3.6–5.1)
Alkaline phosphatase (APISO): 67 U/L (ref 35–144)
BUN: 20 mg/dL (ref 7–25)
CO2: 25 mmol/L (ref 20–32)
Calcium: 9 mg/dL (ref 8.6–10.3)
Chloride: 101 mmol/L (ref 98–110)
Creat: 0.79 mg/dL (ref 0.70–1.35)
Globulin: 2.7 g/dL (calc) (ref 1.9–3.7)
Glucose, Bld: 194 mg/dL — ABNORMAL HIGH (ref 65–99)
Potassium: 4.5 mmol/L (ref 3.5–5.3)
Sodium: 136 mmol/L (ref 135–146)
Total Bilirubin: 0.4 mg/dL (ref 0.2–1.2)
Total Protein: 6.7 g/dL (ref 6.1–8.1)
eGFR: 100 mL/min/{1.73_m2} (ref >=60)

## 2021-04-19 LAB — HEMOGLOBIN A1C
Hgb A1c MFr Bld: 8.2 % of total Hgb — ABNORMAL HIGH (ref <5.7)
Mean Plasma Glucose: 189 mg/dL
eAG (mmol/L): 10.4 mmol/L

## 2021-05-07 ENCOUNTER — Emergency Department (HOSPITAL_COMMUNITY): Payer: 59

## 2021-05-07 ENCOUNTER — Inpatient Hospital Stay (HOSPITAL_COMMUNITY)
Admission: EM | Admit: 2021-05-07 | Discharge: 2021-05-09 | DRG: 066 | Disposition: A | Discharge status: Home / Self Care | Payer: 59 | Attending: Internal Medicine | Admitting: Internal Medicine

## 2021-05-07 ENCOUNTER — Encounter (HOSPITAL_COMMUNITY): Payer: Self-pay

## 2021-05-07 DIAGNOSIS — Z7985 Long-term (current) use of injectable non-insulin antidiabetic drugs: Secondary | ICD-10-CM

## 2021-05-07 DIAGNOSIS — Z7982 Long term (current) use of aspirin: Secondary | ICD-10-CM

## 2021-05-07 DIAGNOSIS — Z8619 Personal history of other infectious and parasitic diseases: Secondary | ICD-10-CM

## 2021-05-07 DIAGNOSIS — Z6834 Body mass index (BMI) 34.0-34.9, adult: Secondary | ICD-10-CM

## 2021-05-07 DIAGNOSIS — Z823 Family history of stroke: Secondary | ICD-10-CM

## 2021-05-07 DIAGNOSIS — Z87442 Personal history of urinary calculi: Secondary | ICD-10-CM

## 2021-05-07 DIAGNOSIS — Z8249 Family history of ischemic heart disease and other diseases of the circulatory system: Secondary | ICD-10-CM

## 2021-05-07 DIAGNOSIS — Z888 Allergy status to other drugs, medicaments and biological substances status: Secondary | ICD-10-CM

## 2021-05-07 DIAGNOSIS — Z885 Allergy status to narcotic agent status: Secondary | ICD-10-CM

## 2021-05-07 DIAGNOSIS — Z79899 Other long term (current) drug therapy: Secondary | ICD-10-CM

## 2021-05-07 DIAGNOSIS — R4701 Aphasia: Secondary | ICD-10-CM | POA: Diagnosis not present

## 2021-05-07 DIAGNOSIS — Z89511 Acquired absence of right leg below knee: Secondary | ICD-10-CM

## 2021-05-07 DIAGNOSIS — E785 Hyperlipidemia, unspecified: Secondary | ICD-10-CM | POA: Diagnosis present

## 2021-05-07 DIAGNOSIS — D72829 Elevated white blood cell count, unspecified: Secondary | ICD-10-CM

## 2021-05-07 DIAGNOSIS — R29701 NIHSS score 1: Secondary | ICD-10-CM | POA: Diagnosis present

## 2021-05-07 DIAGNOSIS — Z89512 Acquired absence of left leg below knee: Secondary | ICD-10-CM

## 2021-05-07 DIAGNOSIS — E669 Obesity, unspecified: Secondary | ICD-10-CM | POA: Diagnosis present

## 2021-05-07 DIAGNOSIS — I63412 Cerebral infarction due to embolism of left middle cerebral artery: Principal | ICD-10-CM | POA: Diagnosis present

## 2021-05-07 DIAGNOSIS — G9389 Other specified disorders of brain: Secondary | ICD-10-CM | POA: Diagnosis present

## 2021-05-07 DIAGNOSIS — E1169 Type 2 diabetes mellitus with other specified complication: Secondary | ICD-10-CM | POA: Diagnosis present

## 2021-05-07 DIAGNOSIS — E1151 Type 2 diabetes mellitus with diabetic peripheral angiopathy without gangrene: Secondary | ICD-10-CM | POA: Diagnosis present

## 2021-05-07 DIAGNOSIS — Z20822 Contact with and (suspected) exposure to covid-19: Secondary | ICD-10-CM | POA: Diagnosis present

## 2021-05-07 DIAGNOSIS — I6523 Occlusion and stenosis of bilateral carotid arteries: Secondary | ICD-10-CM | POA: Diagnosis present

## 2021-05-07 DIAGNOSIS — F17210 Nicotine dependence, cigarettes, uncomplicated: Secondary | ICD-10-CM | POA: Diagnosis present

## 2021-05-07 DIAGNOSIS — I6330 Cerebral infarction due to thrombosis of unspecified cerebral artery: Secondary | ICD-10-CM | POA: Insufficient documentation

## 2021-05-07 DIAGNOSIS — E1140 Type 2 diabetes mellitus with diabetic neuropathy, unspecified: Secondary | ICD-10-CM | POA: Diagnosis present

## 2021-05-07 DIAGNOSIS — Z7984 Long term (current) use of oral hypoglycemic drugs: Secondary | ICD-10-CM

## 2021-05-07 DIAGNOSIS — Y990 Civilian activity done for income or pay: Secondary | ICD-10-CM

## 2021-05-07 DIAGNOSIS — I639 Cerebral infarction, unspecified: Principal | ICD-10-CM

## 2021-05-07 DIAGNOSIS — I10 Essential (primary) hypertension: Secondary | ICD-10-CM | POA: Diagnosis present

## 2021-05-07 LAB — RESP PANEL BY RT-PCR (FLU A&B, COVID) ARPGX2
Influenza A by PCR: NEGATIVE
Influenza B by PCR: NEGATIVE
SARS Coronavirus 2 by RT PCR: NEGATIVE

## 2021-05-07 LAB — I-STAT CHEM 8, ED
BUN: 26 mg/dL — ABNORMAL HIGH (ref 8–23)
Calcium, Ion: 1.12 mmol/L — ABNORMAL LOW (ref 1.15–1.40)
Chloride: 98 mmol/L (ref 98–111)
Creatinine, Ser: 0.8 mg/dL (ref 0.61–1.24)
Glucose, Bld: 81 mg/dL (ref 70–99)
HCT: 46 % (ref 39.0–52.0)
Hemoglobin: 15.6 g/dL (ref 13.0–17.0)
Potassium: 4.5 mmol/L (ref 3.5–5.1)
Sodium: 136 mmol/L (ref 135–145)
TCO2: 28 mmol/L (ref 22–32)

## 2021-05-07 LAB — CBC
HCT: 45.2 % (ref 39.0–52.0)
Hemoglobin: 15.3 g/dL (ref 13.0–17.0)
MCH: 32.1 pg (ref 26.0–34.0)
MCHC: 33.8 g/dL (ref 30.0–36.0)
MCV: 94.8 fL (ref 80.0–100.0)
Platelets: 204 10*3/uL (ref 150–400)
RBC: 4.77 MIL/uL (ref 4.22–5.81)
RDW: 13.9 % (ref 11.5–15.5)
WBC: 12.9 10*3/uL — ABNORMAL HIGH (ref 4.0–10.5)
nRBC: 0 % (ref 0.0–0.2)

## 2021-05-07 LAB — DIFFERENTIAL
Abs Immature Granulocytes: 0.04 10*3/uL (ref 0.00–0.07)
Basophils Absolute: 0.1 10*3/uL (ref 0.0–0.1)
Basophils Relative: 1 %
Eosinophils Absolute: 0.1 10*3/uL (ref 0.0–0.5)
Eosinophils Relative: 1 %
Immature Granulocytes: 0 %
Lymphocytes Relative: 38 %
Lymphs Abs: 4.9 10*3/uL — ABNORMAL HIGH (ref 0.7–4.0)
Monocytes Absolute: 1 10*3/uL (ref 0.1–1.0)
Monocytes Relative: 8 %
Neutro Abs: 6.7 10*3/uL (ref 1.7–7.7)
Neutrophils Relative %: 52 %

## 2021-05-07 LAB — COMPREHENSIVE METABOLIC PANEL
ALT: 37 U/L (ref 0–44)
AST: 29 U/L (ref 15–41)
Albumin: 3.8 g/dL (ref 3.5–5.0)
Alkaline Phosphatase: 57 U/L (ref 38–126)
Anion gap: 10 (ref 5–15)
BUN: 21 mg/dL (ref 8–23)
CO2: 25 mmol/L (ref 22–32)
Calcium: 9.3 mg/dL (ref 8.9–10.3)
Chloride: 100 mmol/L (ref 98–111)
Creatinine, Ser: 0.96 mg/dL (ref 0.61–1.24)
GFR, Estimated: >60 mL/min (ref >60)
Glucose, Bld: 103 mg/dL — ABNORMAL HIGH (ref 70–99)
Potassium: 4.3 mmol/L (ref 3.5–5.1)
Sodium: 135 mmol/L (ref 135–145)
Total Bilirubin: 0.8 mg/dL (ref 0.3–1.2)
Total Protein: 7.4 g/dL (ref 6.5–8.1)

## 2021-05-07 LAB — ETHANOL: Alcohol, Ethyl (B): <10 mg/dL (ref <10)

## 2021-05-07 LAB — APTT: aPTT: 30 seconds (ref 24–36)

## 2021-05-07 LAB — PROTIME-INR
INR: 1 (ref 0.8–1.2)
Prothrombin Time: 13 seconds (ref 11.4–15.2)

## 2021-05-07 NOTE — ED Triage Notes (Signed)
Pt LSN at noon, pt then began became confused and slurring speech, no deficits at this time.  ?

## 2021-05-07 NOTE — ED Provider Triage Note (Signed)
Emergency Medicine Provider Triage Evaluation Note ? ?[name] , a 61 y.o. male  was evaluated in triage.  Pt complains of nonsensical speech that occurred around noon today while at work. LKN just prior to same. Wife reports she was told that pt began talking nonsense and not making much sense. His speech has since improved however still seems confused. Pt denies weakness or numbness. ? ?Review of Systems  ?Positive: + speech changes, confusion ?Negative: - weakness, numbness, facial droop ? ?Physical Exam  ?BP (!) 148/65 (BP Location: Left Arm)   Pulse 64   Temp 97.9 ?F (36.6 ?C) (Oral)   Resp 18   SpO2 99%  ?Gen:   Awake, no distress   ?Resp:  Normal effort  ?MSK:   Moves extremities without difficulty  ?Other:  Alert to self and place. Unable to determine the year, month, day of the week or who the president is. CN 2-12 intact. Strength 5/5 to Bue and BLEs. Sensation intact throughout. No drift.  ? ?Medical Decision Making  ?Medically screening exam initiated at 8:34 PM.  Appropriate orders placed.  [name] was informed that the remainder of the evaluation will be completed by another provider, this initial triage assessment does not replace that evaluation, and the importance of remaining in the ED until their evaluation is complete. ? ? ?  ?Eustaquio Maize, PA-C ?05/07/21 2037 ? ?

## 2021-05-08 ENCOUNTER — Emergency Department (HOSPITAL_COMMUNITY): Payer: 59

## 2021-05-08 ENCOUNTER — Inpatient Hospital Stay (HOSPITAL_COMMUNITY): Payer: 59

## 2021-05-08 DIAGNOSIS — D72829 Elevated white blood cell count, unspecified: Secondary | ICD-10-CM

## 2021-05-08 DIAGNOSIS — Z6834 Body mass index (BMI) 34.0-34.9, adult: Secondary | ICD-10-CM | POA: Diagnosis not present

## 2021-05-08 DIAGNOSIS — E1169 Type 2 diabetes mellitus with other specified complication: Secondary | ICD-10-CM | POA: Diagnosis present

## 2021-05-08 DIAGNOSIS — E1151 Type 2 diabetes mellitus with diabetic peripheral angiopathy without gangrene: Secondary | ICD-10-CM | POA: Diagnosis present

## 2021-05-08 DIAGNOSIS — E1140 Type 2 diabetes mellitus with diabetic neuropathy, unspecified: Secondary | ICD-10-CM | POA: Diagnosis present

## 2021-05-08 DIAGNOSIS — E669 Obesity, unspecified: Secondary | ICD-10-CM | POA: Diagnosis present

## 2021-05-08 DIAGNOSIS — I6389 Other cerebral infarction: Secondary | ICD-10-CM

## 2021-05-08 DIAGNOSIS — I6523 Occlusion and stenosis of bilateral carotid arteries: Secondary | ICD-10-CM | POA: Diagnosis present

## 2021-05-08 DIAGNOSIS — I639 Cerebral infarction, unspecified: Secondary | ICD-10-CM | POA: Diagnosis not present

## 2021-05-08 DIAGNOSIS — E785 Hyperlipidemia, unspecified: Secondary | ICD-10-CM | POA: Diagnosis present

## 2021-05-08 DIAGNOSIS — Z8619 Personal history of other infectious and parasitic diseases: Secondary | ICD-10-CM | POA: Diagnosis not present

## 2021-05-08 DIAGNOSIS — Z7984 Long term (current) use of oral hypoglycemic drugs: Secondary | ICD-10-CM | POA: Diagnosis not present

## 2021-05-08 DIAGNOSIS — Z7985 Long-term (current) use of injectable non-insulin antidiabetic drugs: Secondary | ICD-10-CM | POA: Diagnosis not present

## 2021-05-08 DIAGNOSIS — Z87442 Personal history of urinary calculi: Secondary | ICD-10-CM | POA: Diagnosis not present

## 2021-05-08 DIAGNOSIS — R4701 Aphasia: Secondary | ICD-10-CM | POA: Diagnosis present

## 2021-05-08 DIAGNOSIS — Y990 Civilian activity done for income or pay: Secondary | ICD-10-CM | POA: Diagnosis not present

## 2021-05-08 DIAGNOSIS — I63412 Cerebral infarction due to embolism of left middle cerebral artery: Secondary | ICD-10-CM | POA: Diagnosis present

## 2021-05-08 DIAGNOSIS — R2990 Unspecified symptoms and signs involving the nervous system: Secondary | ICD-10-CM

## 2021-05-08 DIAGNOSIS — Z8249 Family history of ischemic heart disease and other diseases of the circulatory system: Secondary | ICD-10-CM | POA: Diagnosis not present

## 2021-05-08 DIAGNOSIS — F17210 Nicotine dependence, cigarettes, uncomplicated: Secondary | ICD-10-CM | POA: Diagnosis present

## 2021-05-08 DIAGNOSIS — R29701 NIHSS score 1: Secondary | ICD-10-CM | POA: Diagnosis present

## 2021-05-08 DIAGNOSIS — I10 Essential (primary) hypertension: Secondary | ICD-10-CM | POA: Diagnosis present

## 2021-05-08 DIAGNOSIS — Z20822 Contact with and (suspected) exposure to covid-19: Secondary | ICD-10-CM | POA: Diagnosis present

## 2021-05-08 DIAGNOSIS — Z89511 Acquired absence of right leg below knee: Secondary | ICD-10-CM | POA: Diagnosis not present

## 2021-05-08 DIAGNOSIS — Z7982 Long term (current) use of aspirin: Secondary | ICD-10-CM | POA: Diagnosis not present

## 2021-05-08 DIAGNOSIS — G9389 Other specified disorders of brain: Secondary | ICD-10-CM | POA: Diagnosis present

## 2021-05-08 DIAGNOSIS — Z823 Family history of stroke: Secondary | ICD-10-CM | POA: Diagnosis not present

## 2021-05-08 DIAGNOSIS — Z89512 Acquired absence of left leg below knee: Secondary | ICD-10-CM | POA: Diagnosis not present

## 2021-05-08 DIAGNOSIS — I6330 Cerebral infarction due to thrombosis of unspecified cerebral artery: Secondary | ICD-10-CM | POA: Diagnosis not present

## 2021-05-08 LAB — ECHOCARDIOGRAM COMPLETE
AR max vel: 3.08 cm2
AV Area VTI: 3.1 cm2
AV Area mean vel: 3 cm2
AV Mean grad: 3 mmHg
AV Peak grad: 5.2 mmHg
Ao pk vel: 1.14 m/s
Area-P 1/2: 2.95 cm2
S' Lateral: 3.6 cm

## 2021-05-08 LAB — URINALYSIS, ROUTINE W REFLEX MICROSCOPIC
Bilirubin Urine: NEGATIVE
Glucose, UA: NEGATIVE mg/dL
Hgb urine dipstick: NEGATIVE
Ketones, ur: NEGATIVE mg/dL
Leukocytes,Ua: NEGATIVE
Nitrite: NEGATIVE
Protein, ur: NEGATIVE mg/dL
Specific Gravity, Urine: 1.025 (ref 1.005–1.030)
pH: 5 (ref 5.0–8.0)

## 2021-05-08 LAB — RAPID URINE DRUG SCREEN, HOSP PERFORMED
Amphetamines: NOT DETECTED
Barbiturates: NOT DETECTED
Benzodiazepines: NOT DETECTED
Cocaine: NOT DETECTED
Opiates: NOT DETECTED
Tetrahydrocannabinol: POSITIVE — AB

## 2021-05-08 LAB — CBC
HCT: 42.2 % (ref 39.0–52.0)
Hemoglobin: 14.7 g/dL (ref 13.0–17.0)
MCH: 32.5 pg (ref 26.0–34.0)
MCHC: 34.8 g/dL (ref 30.0–36.0)
MCV: 93.4 fL (ref 80.0–100.0)
Platelets: 172 10*3/uL (ref 150–400)
RBC: 4.52 MIL/uL (ref 4.22–5.81)
RDW: 13.9 % (ref 11.5–15.5)
WBC: 9.1 10*3/uL (ref 4.0–10.5)
nRBC: 0 % (ref 0.0–0.2)

## 2021-05-08 LAB — GLUCOSE, CAPILLARY
Glucose-Capillary: 116 mg/dL — ABNORMAL HIGH (ref 70–99)
Glucose-Capillary: 123 mg/dL — ABNORMAL HIGH (ref 70–99)
Glucose-Capillary: 172 mg/dL — ABNORMAL HIGH (ref 70–99)

## 2021-05-08 LAB — CBG MONITORING, ED: Glucose-Capillary: 164 mg/dL — ABNORMAL HIGH (ref 70–99)

## 2021-05-08 LAB — HIV ANTIBODY (ROUTINE TESTING W REFLEX): HIV Screen 4th Generation wRfx: NONREACTIVE

## 2021-05-08 MED ORDER — INSULIN ASPART 100 UNIT/ML IJ SOLN
0.0000 [IU] | Freq: Three times a day (TID) | INTRAMUSCULAR | Status: DC
  Administered 2021-05-08: 1 [IU] via SUBCUTANEOUS
  Administered 2021-05-08 – 2021-05-09 (×2): 2 [IU] via SUBCUTANEOUS
  Administered 2021-05-09: 5 [IU] via SUBCUTANEOUS

## 2021-05-08 MED ORDER — IOHEXOL 350 MG/ML SOLN
75.0000 mL | Freq: Once | INTRAVENOUS | Status: AC | PRN
  Administered 2021-05-08: 75 mL via INTRAVENOUS

## 2021-05-08 MED ORDER — ROSUVASTATIN CALCIUM 20 MG PO TABS
40.0000 mg | ORAL_TABLET | Freq: Every day | ORAL | Status: DC
  Administered 2021-05-08 – 2021-05-09 (×2): 40 mg via ORAL
  Filled 2021-05-08 (×2): qty 2

## 2021-05-08 MED ORDER — CLOPIDOGREL BISULFATE 75 MG PO TABS
75.0000 mg | ORAL_TABLET | Freq: Every day | ORAL | Status: DC
  Administered 2021-05-08 – 2021-05-09 (×2): 75 mg via ORAL
  Filled 2021-05-08 (×2): qty 1

## 2021-05-08 MED ORDER — ACETAMINOPHEN 650 MG RE SUPP: 650.0000 mg | Freq: Four times a day (QID) | RECTAL | Status: DC | PRN

## 2021-05-08 MED ORDER — ASPIRIN EC 325 MG PO TBEC
325.0000 mg | DELAYED_RELEASE_TABLET | Freq: Every day | ORAL | Status: DC
Start: 2021-05-08 — End: 2021-05-09
  Administered 2021-05-08 – 2021-05-09 (×2): 325 mg via ORAL
  Filled 2021-05-08 (×2): qty 1

## 2021-05-08 MED ORDER — PERFLUTREN LIPID MICROSPHERE
1.0000 mL | INTRAVENOUS | Status: AC | PRN
  Administered 2021-05-08: 2 mL via INTRAVENOUS
  Filled 2021-05-08: qty 10

## 2021-05-08 MED ORDER — ACETAMINOPHEN 325 MG PO TABS: 650.0000 mg | ORAL_TABLET | Freq: Four times a day (QID) | ORAL | Status: DC | PRN

## 2021-05-08 MED ORDER — ACETAMINOPHEN 160 MG/5ML PO SOLN: 650.0000 mg | Freq: Four times a day (QID) | ORAL | Status: DC | PRN

## 2021-05-08 MED ORDER — STROKE: EARLY STAGES OF RECOVERY BOOK: Freq: Once | Status: AC

## 2021-05-08 MED ORDER — ENOXAPARIN SODIUM 40 MG/0.4ML IJ SOSY
40.0000 mg | PREFILLED_SYRINGE | INTRAMUSCULAR | Status: DC
  Administered 2021-05-08 – 2021-05-09 (×2): 40 mg via SUBCUTANEOUS
  Filled 2021-05-08 (×2): qty 0.4

## 2021-05-08 MED ORDER — INSULIN ASPART 100 UNIT/ML IJ SOLN: 0.0000 [IU] | Freq: Every day | INTRAMUSCULAR | Status: DC

## 2021-05-08 MED ORDER — ATORVASTATIN CALCIUM 80 MG PO TABS: 80.0000 mg | ORAL_TABLET | Freq: Every day | ORAL | Status: DC

## 2021-05-08 NOTE — Consult Note (Signed)
Neurology Consultation ? ?Reason for Consult: aphasia ?Referring Physician: Dr Marlowe Sax   ? ?CC: word finding difficulty ? ?History is obtained from:patient and chart  ? ?HPI: [name] is a 61 y.o. male past medical history of diabetes, hepatitis C, hypertension, hyperlipidemia, presented to the emergency room for evaluation of sudden onset of word finding difficulty.  He said he was fine and normal around noon yesterday and later on in the afternoon, it was noted that his speech was not making sense and he was having trouble forming his words.  This made his family bring him to the emergency room.  He was evaluated in the emergency room and his symptoms had mostly resolved.  Code stroke was not activated because of him being outside the window and resolution of symptoms.  Imaging in the form of CT head was done which revealed old left MCA territory infarcts.  MRI brain was completed which showed a moderate area of left frontal lobe acute infarction in addition to the chronic infarcts in the left frontoparietal regions. ?Patient reports no preceding illness or sickness.  Reports no tingling numbness or weakness.  Reports no chest pain shortness of breath nausea vomiting. ? ? 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 ?ROS: Full ROS was performed and is negative except as noted in the HPI.  ? ?Past Medical History:  ?Diagnosis Date  ? Diabetic neuropathy ([place])   ? Diverticulitis   ? History of hepatitis C   ? has been treated in the past  ? History of kidney stones   ? Hyperlipidemia   ? Hypertension   ? 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 Other testicular hypofunction   ? Severe sepsis ([place]) 08/26/2019  ? Type II or unspecified type diabetes mellitus without mention of complication, not stated as uncontrolled   ? Vitamin D deficiency   ? ? ?Family History  ?Problem Relation Age of Onset  ? Hypertension Mother   ? Cancer Father   ?     colon  ?  Alzheimer's disease Father   ? Stroke Father   ? ? ?Social History:  ? reports that he has been smoking cigarettes. He has been smoking an average of 1 pack per day. He has never used smokeless tobacco. He reports current drug use. Drug: Marijuana. He reports that he does not drink alcohol. ? ?Medications ? ?Current Facility-Administered Medications:  ?   stroke: early stages of recovery book, , Does not apply, Once, Shela Leff, MD ?  acetaminophen (TYLENOL) tablet 650 mg, 650 mg, Oral, Q6H PRN **OR** acetaminophen (TYLENOL) 160 MG/5ML solution 650 mg, 650 mg, Per Tube, Q6H PRN **OR** acetaminophen (TYLENOL) suppository 650 mg, 650 mg, Rectal, Q6H PRN, Shela Leff, MD ?  enoxaparin (LOVENOX) injection 40 mg, 40 mg, Subcutaneous, Q24H, Rathore, Vasundhra, MD ?  insulin aspart (novoLOG) injection 0-5 Units, 0-5 Units, Subcutaneous, QHS, Rathore, Vasundhra, MD ?  insulin aspart (novoLOG) injection 0-9 Units, 0-9 Units, Subcutaneous, TID WC, Shela Leff, MD ? ?Current Outpatient Medications:  ?  aspirin 81 MG tablet, Take 81 mg by mouth daily., Disp: , Rfl:  ?  citalopram (CELEXA) 40 MG tablet, Take 1 tablet Daily for Mood & Chronic Anxiety, Disp: 90 tablet, Rfl: 3 ?  Continuous Blood Gluc Sensor ([place]) MISC, 1 each by Does not apply route daily., Disp: 1 each, Rfl: 0 ?  Dulaglutide (TRULICITY) [id number] SOPN, Inject  0.75 mg into the skin once a week., Disp: 2 mL, Rfl: 3 ?  hydrochlorothiazide (HYDRODIURIL) 25 MG tablet, Take  1 tablet  Daily  for BP & Fluid Retention / Ankle Swelling, Disp: 90 tablet, Rfl: 3 ?  lisinopril (ZESTRIL) 20 MG tablet, Take 1 tablet Daily for BP & Kidney Protection, Disp: 90 tablet, Rfl: 3 ?  metFORMIN (GLUCOPHAGE-XR) 500 MG 24 hr tablet, Take 2 tablets 2 x /day with Meals  for Diabetes, Disp: 360 tablet, Rfl: 3 ?  rosuvastatin (CRESTOR) 40 MG tablet, Take one tablet daily for Cholesterol., Disp: 90 tablet, Rfl: 3 ?  verapamil (CALAN-SR) 240  MG CR tablet, Take 1 tablet Daily with Food for BP & Heart  Rhythm, Disp: 90 tablet, Rfl: 2 ? ?Exam: ?Current vital signs: ?BP (!) 142/68   Pulse 80   Temp 97.9 ?F (36.6 ?C) (Oral)   Resp 13   SpO2 99%  ?Vital signs in last 24 hours: ?Temp:  [97.9 ?F (36.6 ?C)] 97.9 ?F (36.6 ?C) (04/17 2027) ?Pulse Rate:  [60-80] 80 (04/18 0600) ?Resp:  [13-19] 13 (04/18 0600) ?BP: (117-148)/(65-79) 142/68 (04/18 0600) ?SpO2:  [93 %-99 %] 99 % (04/18 0600) ? ?GENERAL: Awake, alert in NAD ?HEENT: - Normocephalic and atraumatic, dry mm, no LN++, no Thyromegally ?LUNGS - Clear to auscultation bilaterally with no wheezes ?CV - S1S2 RRR, no m/r/g, equal pulses bilaterally. ?ABDOMEN - Soft, nontender, nondistended with normoactive BS ?Ext: [name]. ? ?NEURO:  ?Mental Status: AA&Ox3  ?Language: speech is nondysarthric.  Naming, repetition, comprehension are intact but there is somewhat impaired fluency. ?Cranial Nerves: PERRL . EOMI, visual fields full, no facial asymmetry, facial sensation intact, hearing intact, tongue/uvula/soft palate midline, normal sternocleidomastoid and trapezius muscle strength. No evidence of tongue atrophy or fibrillations ?Motor: Antigravity strength in all fours without vertical drift ?Tone: is normal and bulk is normal ?Sensation- Intact to light touch bilaterally ?Coordination: FTN intact bilaterally ?Gait- deferred ? ?NIHSS--1 for aphasia ? ? ?Labs ?I have reviewed labs in epic and the results pertinent to this consultation are: ? ? ?CBC ?   ?Component Value Date/Time  ? WBC 12.9 (H) 05/07/2021 2046  ? RBC 4.77 05/07/2021 2046  ? HGB 15.6 05/07/2021 2335  ? HCT 46.0 05/07/2021 2335  ? PLT 204 05/07/2021 2046  ? MCV 94.8 05/07/2021 2046  ? MCH 32.1 05/07/2021 2046  ? MCHC 33.8 05/07/2021 2046  ? RDW 13.9 05/07/2021 2046  ? LYMPHSABS 4.9 (H) 05/07/2021 2046  ? MONOABS 1.0 05/07/2021 2046  ? EOSABS 0.1 05/07/2021 2046  ? BASOSABS 0.1 05/07/2021 2046  ? ? ?CMP  ?   ?Component Value Date/Time  ? NA 136  05/07/2021 2335  ? K 4.5 05/07/2021 2335  ? CL 98 05/07/2021 2335  ? CO2 25 05/07/2021 2046  ? GLUCOSE 81 05/07/2021 2335  ? BUN 26 (H) 05/07/2021 2335  ? CREATININE 0.80 05/07/2021 2335  ? CREATININE 0.79 04/18/2021 1155  ? CALCIUM 9.3 05/07/2021 2046  ? PROT 7.4 05/07/2021 2046  ? ALBUMIN 3.8 05/07/2021 2046  ? AST 29 05/07/2021 2046  ? ALT 37 05/07/2021 2046  ? ALKPHOS 57 05/07/2021 2046  ? BILITOT 0.8 05/07/2021 2046  ? GFRNONAA >60 05/07/2021 2046  ? GFRNONAA 85 07/19/2020 1458  ? GFRAA 98 07/19/2020 1458  ? ? ?Lipid Panel  ?   ?Component Value Date/Time  ? CHOL 113 04/18/2021 1155  ? TRIG 262 (H) 04/18/2021 1155  ? HDL 30 (L) 04/18/2021 1155  ? CHOLHDL 3.8  04/18/2021 1155  ? VLDL 63 (H) 06/11/2016 1648  ? [place] 52 04/18/2021 1155  ? ? ? ?Imaging ?I have reviewed the images obtained: ? ?CT-head-no acute changes ? ?MRI examination of the brain-acute left frontal infarct ? ?Assessment:  ?61 year old past history of diabetes, pleurisy, hypertension hyperlipidemia presenting for an episode of word finding difficulty and speech not making sense.  Outside the window for IV TNKase. ?MRI with a left frontal infarct-embolic (more likely) versus small vessel etiology. ?Although he is within the window for endovascular intervention, low NIH stroke scale precludes intervention at this time.  He would benefit from stroke risk factor work-up. ? ?Cerebral infarction due to embolism of left middle cerebral artery ? ? ?Recommendations: ?-Admit to hospitalist ?-Telemetry monitoring ?-Allow for permissive hypertension for the first 24-48h - only treat PRN if SBP >220 mmHg. Blood pressures can be gradually normalized to SBP<140 upon discharge. ?-CT Angiogram of Head and neck ?-Echocardiogram ?-HgbA1c, fasting lipid panel ?-Frequent neuro checks ?-Prophylactic therapy-Antiplatelet med: Aspirin 325+ Plavix 75 ?-Atorvastatin 80 mg PO daily ?-Risk factor modification ?-PT consult, OT consult, Speech consult ?-If Afib found on  telemetry, will need anticoagulation. Decision pending imaging and stroke team rounding. ?Plan discussed with ED provider Dr. Leonette Monarch ?Stroke team to follow ?-- ?Amie Portland, MD ?Neurologist ?[facility] ?

## 2021-05-08 NOTE — H&P (Signed)
?History and Physical  ? ? ?[name] JJH:[id number] DOB: 22-Sep-1960 DOA: 05/07/2021 ? ?PCP: Lucky Cowboy, MD ? ?Patient coming from: Home ? ?Chief Complaint: Slurred speech ? ?HPI: [name] is a 61 y.o. male with medical history significant of type 2 diabetes, bilateral BKA, hypertension, hyperlipidemia, tobacco use presented to the ED with sudden onset aphasia, LKW around noon.  Incident occurred at work.  Around 5 PM wife noted nonsensical speech and word finding difficulty.  Vital signs stable.  WBC 12.9, hemoglobin 15.3, platelet count 204k.  Sodium 135, potassium 4.3, chloride 100, bicarb 25, BUN 21, creatinine 0.9, glucose 103.  Blood ethanol level undetectable.  UA and UDS pending.  CT head negative for acute stroke.  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 Neurology consulted and recommended obtaining CTA/perfusion study and brain MRI. ? ?Patient reports acute onset problem with his speech/word finding difficulty which he thinks started at around noon yesterday.  He did not have any changes in his vision, weakness in his arm or leg, or numbness.  He denies history of prior stroke.  He has no other complaints.  Denies fevers, chills, cough, shortness of breath, chest pain, nausea, vomiting, abdominal pain, diarrhea, or dysuria. ? ?Review of Systems:  ?Review of Systems  ?All other systems reviewed and are negative. ? ?Past Medical History:  ?Diagnosis Date  ? Diabetic neuropathy ([name])   ? Diverticulitis   ? History of hepatitis C   ? has been treated in the past  ? History of kidney stones   ? Hyperlipidemia   ? Hypertension   ? 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[name])   ? Other testicular hypofunction   ? Severe sepsis ([name]) 08/26/2019  ? Type II or unspecified type diabetes mellitus without mention of complication, not stated as uncontrolled   ? Vitamin D deficiency   ? ? ?Past Surgical History:  ?Procedure Laterality Date  ?  AMPUTATION Right 09/01/2019  ? Procedure: RIGHT BELOW KNEE AMPUTATION;  Surgeon: Nadara Mustard, MD;  Location: [facility] OR;  Service: Orthopedics;  Laterality: Right;  ? CHOLECYSTECTOMY  1987  ? COLONOSCOPY    ? I & D EXTREMITY Right 08/20/2019  ? Procedure: IRRIGATION & DEBRIDEMENT RIGHT FOOT PARTIAL CALCANEAL EXCISION;  Surgeon: Nadara Mustard, MD;  Location: [facility] OR;  Service: Orthopedics;  Laterality: Right;  ? I & D EXTREMITY Right 08/27/2019  ? Procedure: IRRIGATION AND DEBRIDEMENT  OF FOOT;  Surgeon: Tarry Kos, MD;  Location: [facility] OR;  Service: Orthopedics;  Laterality: Right;  ? ORIF TIBIA FRACTURE    ? x 3 right leg  ? ? ? reports that he has been smoking cigarettes. He has been smoking an average of 1 pack per day. He has never used smokeless tobacco. He reports current drug use. Drug: Marijuana. He reports that he does not drink alcohol. ? ?Allergies  ?Allergen Reactions  ? Invokamet [Canagliflozin-Metformin Hcl] Other (See Comments)  ?  Extremity edema/caused pain  ? Invokana [Canagliflozin] Other (See Comments)  ?  Extremity edema/caused pain  ? Codeine Camsylate [Codeine] Rash  ? Morphine And Related Rash  ? ? ?Family History  ?Problem Relation Age of Onset  ? Hypertension Mother   ? Cancer Father   ?     colon  ? Alzheimer's disease Father   ? Stroke Father   ? ? ?Prior to Admission medications   ?Medication Sig Start Date End Date Taking? Authorizing Provider  ?  aspirin 81 MG tablet Take 81 mg by mouth daily.    [provider]  ?citalopram (CELEXA) 40 MG tablet Take 1 tablet Daily for Mood & Chronic Anxiety 01/29/21   Lucky CowboyMcKeown, William, MD  ?Continuous Blood Gluc Sensor (FREESTYLE LIBRE SENSOR SYSTEM) MISC 1 each by Does not apply route daily. 09/30/19   Raulkar, Drema PryKrutika P, MD  ?Dulaglutide (TRULICITY) 0.75 MG/0.5ML SOPN Inject 0.75 mg into the skin once a week. 04/18/21   Revonda HumphreyMull, Dana W, NP  ?hydrochlorothiazide (HYDRODIURIL) 25 MG tablet Take  1 tablet  Daily  for BP & Fluid Retention / Ankle Swelling  09/16/20   Lucky CowboyMcKeown, William, MD  ?lisinopril (ZESTRIL) 20 MG tablet Take 1 tablet Daily for BP & Kidney Protection 09/16/20   Lucky CowboyMcKeown, William, MD  ?metFORMIN (GLUCOPHAGE-XR) 500 MG 24 hr tablet Take 2 tablets 2 x /day with Meals  for Diabetes 01/29/21   Lucky CowboyMcKeown, William, MD  ?rosuvastatin (CRESTOR) 40 MG tablet Take one tablet daily for Cholesterol. 01/29/21   Lucky CowboyMcKeown, William, MD  ?verapamil (CALAN-SR) 240 MG CR tablet Take 1 tablet Daily with Food for BP & Heart  Rhythm 08/14/20   Lucky CowboyMcKeown, William, MD  ? ? ?Physical Exam: ?Vitals:  ? 05/07/21 2225 05/08/21 0220 05/08/21 0545 05/08/21 0600  ?BP: 140/79 117/70 (!) 145/69 (!) 142/68  ?Pulse: 64 60 66 80  ?Resp: 19 19 18 13   ?Temp:      ?TempSrc:      ?SpO2: 97% 95% 93% 99%  ? ? ?Physical Exam ?Constitutional:   ?   General: He is not in acute distress. ?HENT:  ?   Head: Normocephalic and atraumatic.  ?Eyes:  ?   Extraocular Movements: Extraocular movements intact.  ?   Conjunctiva/sclera: Conjunctivae normal.  ?Cardiovascular:  ?   Rate and Rhythm: Normal rate and regular rhythm.  ?   Pulses: Normal pulses.  ?Pulmonary:  ?   Effort: Pulmonary effort is normal. No respiratory distress.  ?   Breath sounds: No wheezing or rales.  ?Abdominal:  ?   General: Bowel sounds are normal. There is no distension.  ?   Palpations: Abdomen is soft.  ?   Tenderness: There is no abdominal tenderness.  ?Musculoskeletal:     ?   General: No swelling or tenderness.  ?   Cervical back: Normal range of motion and neck supple.  ?Skin: ?   General: Skin is warm and dry.  ?Neurological:  ?   General: No focal deficit present.  ?   Mental Status: He is alert and oriented to person, place, and time.  ?   Cranial Nerves: No cranial nerve deficit.  ?   Sensory: No sensory deficit.  ?   Motor: No weakness.  ?   Comments: Speech fluent  ?  ? ?Labs on Admission: I have personally reviewed following labs and imaging studies ? ?CBC: ?Recent Labs  ?Lab 05/07/21 ?2046 05/07/21 ?2335  ?WBC 12.9*  --    ?NEUTROABS 6.7  --   ?HGB 15.3 15.6  ?HCT 45.2 46.0  ?MCV 94.8  --   ?PLT 204  --   ? ?Basic Metabolic Panel: ?Recent Labs  ?Lab 05/07/21 ?2046 05/07/21 ?2335  ?NA 135 136  ?K 4.3 4.5  ?CL 100 98  ?CO2 25  --   ?GLUCOSE 103* 81  ?BUN 21 26*  ?CREATININE 0.96 0.80  ?CALCIUM 9.3  --   ? ?GFR: ?CrCl cannot be calculated (Unknown ideal weight.). ?Liver Function Tests: ?Recent Labs  ?Lab  05/07/21 ?2046  ?AST 29  ?ALT 37  ?ALKPHOS 57  ?BILITOT 0.8  ?PROT 7.4  ?ALBUMIN 3.8  ? ?No results for input(s): LIPASE, AMYLASE in the last 168 hours. ?No results for input(s): AMMONIA in the last 168 hours. ?Coagulation Profile: ?Recent Labs  ?Lab 05/07/21 ?2046  ?INR 1.0  ? ?Cardiac Enzymes: ?No results for input(s): CKTOTAL, CKMB, CKMBINDEX, TROPONINI in the last 168 hours. ?BNP (last 3 results) ?No results for input(s): PROBNP in the last 8760 hours. ?HbA1C: ?No results for input(s): HGBA1C in the last 72 hours. ?CBG: ?No results for input(s): GLUCAP in the last 168 hours. ?Lipid Profile: ?No results for input(s): CHOL, HDL, LDLCALC, TRIG, CHOLHDL, LDLDIRECT in the last 72 hours. ?Thyroid Function Tests: ?No results for input(s): TSH, T4TOTAL, FREET4, T3FREE, THYROIDAB in the last 72 hours. ?Anemia Panel: ?No results for input(s): VITAMINB12, FOLATE, FERRITIN, TIBC, IRON, RETICCTPCT in the last 72 hours. ?Urine analysis: ?   ?Component Value Date/Time  ? COLORURINE YELLOW 07/19/2020 1458  ? APPEARANCEUR CLEAR 07/19/2020 1458  ? LABSPEC 1.025 07/19/2020 1458  ? PHURINE < OR = 5.0 07/19/2020 1458  ? GLUCOSEU NEGATIVE 07/19/2020 1458  ? HGBUR NEGATIVE 07/19/2020 1458  ? BILIRUBINUR NEGATIVE 12/04/2015 1042  ? KETONESUR NEGATIVE 07/19/2020 1458  ? PROTEINUR NEGATIVE 07/19/2020 1458  ? UROBILINOGEN 1.0 11/01/2010 2007  ? NITRITE NEGATIVE 07/19/2020 1458  ? LEUKOCYTESUR NEGATIVE 07/19/2020 1458  ? ? ?Radiological Exams on Admission: I have personally reviewed images ?CT HEAD WO CONTRAST ? ?Result Date: 05/07/2021 ?CLINICAL DATA:   Neurologic deficit. EXAM: CT HEAD WITHOUT CONTRAST TECHNIQUE: Contiguous axial images were obtained from the base of the skull through the vertex without intravenous contrast. RADIATION DOSE REDUCTION: This exam was performed ac

## 2021-05-08 NOTE — ED Provider Notes (Signed)
?[facility] ?Provider Note ? ?CSN: [id number] ?Arrival date & time: 05/07/21 2020 ? ?Chief Complaint(s) ?Transient Ischemic Attack ? ?HPI ?[name] is a 61 y.o. male with a past medical history listed below who presents to the emergency department with sudden onset aphasia.  Last known well around noon.  No visual disturbance.  No focal weakness or loss of sensation.  No headache.  No chest pain.  No shortness of breath.  ? ?Incident occurred at work.  When he went home around 5 PM, wife noted nonsensical speech and word finding difficulty, which prompted his visit. ? ?Patient still having difficulty with word finding. ? ?HPI ? ?Past Medical History ?Past Medical History:  ?Diagnosis Date  ? Diabetic neuropathy ([place])   ? Diverticulitis   ? History of hepatitis C   ? has been treated in the past  ? History of kidney stones   ? Hyperlipidemia   ? Hypertension   ? 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[place])   ? Other testicular hypofunction   ? Severe sepsis ([place]) 08/26/2019  ? Type II or unspecified type diabetes mellitus without mention of complication, not stated as uncontrolled   ? Vitamin D deficiency   ? ?Patient Active Problem List  ? Diagnosis Date Noted  ? Stroke-like symptoms 05/08/2021  ? Right below-knee amputee ([place]) 09/03/2019  ? Hyponatremia 08/26/2019  ? Cutaneous abscess of right foot   ? Poorly controlled diabetes mellitus ([place]) 07/16/2019  ? FHx: heart disease 10/08/2017  ? Type 2 diabetes mellitus with stage 2 chronic kidney disease, without long-term current use of insulin ([place]) 10/08/2017  ? Insomnia 07/08/2017  ? Smoker 08/17/2015  ? COPD (chronic obstructive pulmonary disease) ([place]) 08/17/2015  ? T2_NIDDM w/Peripheral Neuropathy 04/01/2014  ? CKD stage 2 due to type 2 diabetes mellitus ([place]) 08/31/2013  ? Medication management 05/19/2013  ? Morbid obesity (BMI 35) 05/19/2013  ? Essential hypertension   ? Hyperlipidemia associated with type 2 diabetes mellitus  ([place])   ? Vitamin D deficiency   ? History of kidney stones   ? Testosterone deficiency   ? History of hepatitis C   ? ?Home Medication(s) ?Prior to Admission medications   ?Medication Sig Start Date End Date Taking? Authorizing Provider  ?aspirin 81 MG tablet Take 81 mg by mouth daily.    [provider]  ?citalopram (CELEXA) 40 MG tablet Take 1 tablet Daily for Mood & Chronic Anxiety 01/29/21   [name], MD  ?Continuous Blood Gluc Sensor ([place]) [place] 1 each by Does not apply route daily. 09/30/19   Raulkar, Clide Deutscher, MD  ?Dulaglutide (TRULICITY) [id number] SOPN Inject 0.75 mg into the skin once a week. 04/18/21   Magda Bernheim, NP  ?hydrochlorothiazide (HYDRODIURIL) 25 MG tablet Take  1 tablet  Daily  for BP & Fluid Retention / Ankle Swelling 09/16/20   [name], MD  ?lisinopril (ZESTRIL) 20 MG tablet Take 1 tablet Daily for BP & Kidney Protection 09/16/20   [name], MD  ?metFORMIN (GLUCOPHAGE-XR) 500 MG 24 hr tablet Take 2 tablets 2 x /day with Meals  for Diabetes 01/29/21   [name], MD  ?rosuvastatin (CRESTOR) 40 MG tablet Take one tablet daily for Cholesterol. 01/29/21   [name], MD  ?verapamil (CALAN-SR) 240 MG CR tablet Take 1 tablet Daily with Food for BP & Heart  Rhythm 08/14/20   [name], MD  ?                                                                                                                                  ?  Allergies ?Invokamet [canagliflozin-metformin hcl], Invokana [canagliflozin], Codeine camsylate [codeine], and Morphine and related ? ?Review of Systems ?Review of Systems ?As noted in HPI ? ?Physical Exam ?Vital Signs  ?I have reviewed the triage vital signs ?BP (!) 142/68   Pulse 80   Temp 97.9 ?F (36.6 ?C) (Oral)   Resp 13   SpO2 99%  ? ?Physical Exam ?Vitals reviewed.  ?Constitutional:   ?   General: He is not in acute distress. ?   Appearance: He is well-developed. He is not diaphoretic.  ?HENT:  ?    Head: Normocephalic and atraumatic.  ?   Nose: Nose normal.  ?Eyes:  ?   General: No scleral icterus.    ?   Right eye: No discharge.     ?   Left eye: No discharge.  ?   Conjunctiva/sclera: Conjunctivae normal.  ?   Pupils: Pupils are equal, round, and reactive to light.  ?Cardiovascular:  ?   Rate and Rhythm: Normal rate and regular rhythm.  ?   Heart sounds: No murmur heard. ?  No friction rub. No gallop.  ?Pulmonary:  ?   Effort: Pulmonary effort is normal. No respiratory distress.  ?   Breath sounds: Normal breath sounds. No stridor. No rales.  ?Abdominal:  ?   General: There is no distension.  ?   Palpations: Abdomen is soft.  ?   Tenderness: There is no abdominal tenderness.  ?Musculoskeletal:     ?   General: No tenderness.  ?   Cervical back: Normal range of motion and neck supple.  ?     Legs: ? ?Skin: ?   General: Skin is warm and dry.  ?   Findings: No erythema or rash.  ?Neurological:  ?   Mental Status: He is alert.  ?   Comments: Mental Status:  ?Alert and oriented to person, place, and time ?Attention and concentration delayed  ?Speech clear. Having word finding difficulty - 2/3 object naming ?Recent memory is delayed ? ?Cranial Nerves:  ?II Visual Fields: Intact to confrontation. Visual fields intact. ?III, IV, VI: Pupils equal and reactive to light and near. Full eye movement without nystagmus  ?V Facial Sensation: Normal. No weakness of masticatory muscles  ?VII: No facial weakness or asymmetry  ?VIII Auditory Acuity: Grossly normal  ?IX/X: The uvula is midline; the palate elevates symmetrically  ?XI: Normal sternocleidomastoid and trapezius strength  ?XII: The tongue is midline. No atrophy or fasciculations.  ? ?Motor System: Muscle Strength: 5/5 and symmetric in the upper and lower extremities. No pronation or drift.  ?Muscle Tone: Tone and muscle bulk are normal in the upper and lower extremities.  ?Coordination: Intact finger-to-nose. No tremor.  ?Sensation: Intact to light touch. ?Gait:  deferred ?  ? ? ?ED Results and Treatments ?Labs ?(all labs ordered are listed, but only abnormal results are displayed) ?Labs Reviewed  ?CBC - Abnormal; Notable for the following components:  ?    Result Value  ? WBC 12.9 (*)   ? All other components within normal limits  ?DIFFERENTIAL - Abnormal; Notable for the following components:  ? Lymphs Abs 4.9 (*)   ? All other components within normal limits  ?COMPREHENSIVE METABOLIC PANEL - Abnormal; Notable for the following components:  ? Glucose, Bld 103 (*)   ? All other components within normal limits  ?I-STAT CHEM 8, ED - Abnormal; Notable for the following components:  ? BUN 26 (*)   ? Calcium, Ion 1.12 (*)   ?  All other components within normal limits  ?RESP PANEL BY RT-PCR (FLU A&B, COVID) [id number]  ?ETHANOL  ?PROTIME-INR  ?APTT  ?RAPID URINE DRUG SCREEN, HOSP PERFORMED  ?URINALYSIS, ROUTINE W REFLEX MICROSCOPIC  ?                                                                                                                       ?EKG ? EKG Interpretation ? ?Date/Time:    ?Ventricular Rate:    ?PR Interval:    ?QRS Duration:   ?QT Interval:    ?QTC Calculation:   ?R Axis:     ?Text Interpretation:   ?  ? ?  ? ?Radiology ?CT HEAD WO CONTRAST ? ?Result Date: 05/07/2021 ?CLINICAL DATA:  Neurologic deficit. EXAM: CT HEAD WITHOUT CONTRAST TECHNIQUE: Contiguous axial images were obtained from the base of the skull through the vertex without intravenous contrast. RADIATION DOSE REDUCTION: This exam was performed according to the departmental dose-optimization program which includes automated exposure control, adjustment of the mA and/or kV according to patient size and/or use of iterative reconstruction technique. COMPARISON:  Head CT dated 04/05/2013. FINDINGS: Brain: Areas of old infarct and encephalomalacia involving the left frontal and posterior parietal lobes. The ventricles and sulci appropriate size for patient's age. There is a cavum septum pellucidum and cavum  vergae. No acute intracranial hemorrhage. No mass effect or midline shift. No extra-axial fluid collection. Vascular: No hyperdense vessel or unexpected calcification. Skull: Normal. Negative for fracture or focal les

## 2021-05-08 NOTE — Progress Notes (Signed)
?      ?                 PROGRESS NOTE ? ?      ?PATIENT DETAILS ?Name: [name] ?Age: 61 y.o. ?Sex: male ?Date of Birth: 10-Dec-1960 ?Admit Date: 05/07/2021 ?Admitting Physician John GiovanniVasundhra Rathore, MD ?ZOX:[id number]:McKeown, Chrissie NoaWilliam, MD ? ?Brief Summary: ? Pt is a 61 y.o. white male with past medical history of diabetes 2, hepatitis C, hypertension, hyperlipidemia, who presented for evaluation of sudden onset of word finding difficulty. On arrival to the ED his symptoms had mostly resolved. Patient denies any preceding illness or sickness.  Reports no tingling numbness or weakness.  Reports no chest pain shortness of breath nausea vomiting. ? ?Significant Events:  ?4/18>>> Sudden onset of word finding difficulty but his symptoms had mostly resolved on ED evaluation. ? ?Significant Studies: ?CT head >>> Areas of old infarct and encephalomalacia involving the left frontal and posterior parietal lobes. ?MRI >>> showed a moderate area of left frontal lobe acute infarction in addition to the chronic infarcts in the left frontoparietal regions. ?Echo >>> pending ? ?Subjective: ?Side-lying comfortably on stretcher, denies any visual changes, denies chest pain or shortness of breath. ? ?Objective: ?Vitals: ?Blood pressure (!) 142/75, pulse 70, temperature 97.9 ?F (36.6 ?C), temperature source Oral, resp. rate 16, SpO2 98 %. ? ?Exam: ?Gen Exam: Alert, awake and answer appropriately not in any distress ?HEENT:atraumatic, normocephalic ?Chest: B/L clear to auscultation anteriorly ?CVS:S1S2 regular ?Abdomen: protuberant, soft non tender, bowel sounds normal ?Extremities:no edema, R. BKA ?Neurology: non focal, alert and response appropriately, speech clear and fluent. ?Skin: no rash ? ?Pertinent Labs/Radiology: ? ?  Latest Ref Rng & Units 05/07/2021  ? 11:35 PM 05/07/2021  ?  8:46 PM 04/18/2021  ? 11:55 AM  ?CBC  ?WBC 4.0 - 10.5 K/uL  12.9   11.3    ?Hemoglobin 13.0 - 17.0 g/dL 09.815.6   11.915.3   14.715.3    ?Hematocrit 39.0 - 52.0 % 46.0   45.2    45.4    ?Platelets 150 - 400 K/uL  204   201    ?  ?Lab Results  ?Component Value Date  ? NA 136 05/07/2021  ? K 4.5 05/07/2021  ? CL 98 05/07/2021  ? CO2 25 05/07/2021  ?  ?Assessment/Plan: ?Assessment and Plan: ?Acute CVA: Started on prophylactic antiplatelet therapy: Aspirin 325mg  and Plavix 75mg  daily, Atorvastatin 80mg  daily. A1c was 8.2 on 04/18/21; LDL 52, Triglycerides 262. ? ?Leukocytosis ?Pt denies any fever or chills, afebriles, no infectious signs or symptoms. ?-UA pending ?-Repeat CBC ?  ?Diabetes type 2 ?CBG (last 3)  ?Recent Labs  ?  05/08/21 ?0743 05/08/21 ?1143  ?GLUCAP 164* 116*  ?  ?A1c 8.2 on 04/18/2021. ?-Sensitive sliding scale insulin ?-Hold oral metformin ?  ?Hypertension ?-Allow permissive hypertension at this time. ?  ?Hyperlipidemia ?LDL 52 and Triglycerides 262; started on atorvastatin 80 mg daily ? ?  ? ?BMI/Underweight: ?Estimated body mass index is 34.28 kg/m? as calculated from the following: ?  Height as of 01/18/21: 6\' 2"  (1.88 m). ?  Weight as of 04/18/21: 121.1 kg.  ? ?Code status: ?  Code Status: Full Code  ? ?DVT Prophylaxis: ?enoxaparin (LOVENOX) injection 40 mg Start: 05/08/21 1000 ?  ? ?Family Communication: Wife in room with patient ? ?Disposition Plan: ?Status is: Inpatient ?Remains inpatient appropriate because: stroke work up pending ?  ?Planned Discharge Destination:Home ? ?Diet: ?Diet Order   ? ?       ?  Diet Carb Modified Fluid consistency: Thin; Room service appropriate? Yes  Diet effective now       ?  ? ?  ?  ? ?  ?  ? ? ?MEDICATIONS: ?Scheduled Meds: ? enoxaparin (LOVENOX) injection  40 mg Subcutaneous Q24H  ? insulin aspart  0-5 Units Subcutaneous QHS  ? insulin aspart  0-9 Units Subcutaneous TID WC  ? ?Continuous Infusions: ?PRN Meds:.acetaminophen **OR** acetaminophen (TYLENOL) oral liquid 160 mg/5 mL **OR** acetaminophen ? ? ?I have personally reviewed following labs and imaging studies ? ?LABORATORY DATA: ?CBC: ?Recent Labs  ?Lab 05/07/21 ?2046 05/07/21 ?2335   ?WBC 12.9*  --   ?NEUTROABS 6.7  --   ?HGB 15.3 15.6  ?HCT 45.2 46.0  ?MCV 94.8  --   ?PLT 204  --   ? ? ?Basic Metabolic Panel: ?Recent Labs  ?Lab 05/07/21 ?2046 05/07/21 ?2335  ?NA 135 136  ?K 4.3 4.5  ?CL 100 98  ?CO2 25  --   ?GLUCOSE 103* 81  ?BUN 21 26*  ?CREATININE 0.96 0.80  ?CALCIUM 9.3  --   ? ? ?GFR: ?CrCl cannot be calculated (Unknown ideal weight.). ? ?Liver Function Tests: ?Recent Labs  ?Lab 05/07/21 ?2046  ?AST 29  ?ALT 37  ?ALKPHOS 57  ?BILITOT 0.8  ?PROT 7.4  ?ALBUMIN 3.8  ? ?No results for input(s): LIPASE, AMYLASE in the last 168 hours. ?No results for input(s): AMMONIA in the last 168 hours. ? ?Coagulation Profile: ?Recent Labs  ?Lab 05/07/21 ?2046  ?INR 1.0  ? ?Cardiac Enzymes: ?No results for input(s): CKTOTAL, CKMB, CKMBINDEX, TROPONINI in the last 168 hours. ? ?BNP (last 3 results) ?No results for input(s): PROBNP in the last 8760 hours. ? ?Lipid Profile: ?No results for input(s): CHOL, HDL, LDLCALC, TRIG, CHOLHDL, LDLDIRECT in the last 72 hours. ? ?Thyroid Function Tests: ?No results for input(s): TSH, T4TOTAL, FREET4, T3FREE, THYROIDAB in the last 72 hours. ? ?Anemia Panel: ?No results for input(s): VITAMINB12, FOLATE, FERRITIN, TIBC, IRON, RETICCTPCT in the last 72 hours. ? ?Urine analysis: ?   ?Component Value Date/Time  ? COLORURINE YELLOW 07/19/2020 1458  ? APPEARANCEUR CLEAR 07/19/2020 1458  ? LABSPEC 1.025 07/19/2020 1458  ? PHURINE < OR = 5.0 07/19/2020 1458  ? GLUCOSEU NEGATIVE 07/19/2020 1458  ? HGBUR NEGATIVE 07/19/2020 1458  ? BILIRUBINUR NEGATIVE 12/04/2015 1042  ? KETONESUR NEGATIVE 07/19/2020 1458  ? PROTEINUR NEGATIVE 07/19/2020 1458  ? UROBILINOGEN 1.0 11/01/2010 2007  ? NITRITE NEGATIVE 07/19/2020 1458  ? LEUKOCYTESUR NEGATIVE 07/19/2020 1458  ? ? ?Sepsis Labs: ?Lactic Acid, Venous ?   ?Component Value Date/Time  ? LATICACIDVEN 1.7 08/26/2019 1925  ? ? ?MICROBIOLOGY: ?Recent Results (from the past 240 hour(s))  ?Resp Panel by RT-PCR (Flu A&B, Covid) Nasopharyngeal Swab      Status: None  ? Collection Time: 05/07/21  9:06 PM  ? Specimen: Nasopharyngeal Swab; Nasopharyngeal(NP) swabs in vial transport medium  ?Result Value Ref Range Status  ? SARS Coronavirus 2 by RT PCR NEGATIVE NEGATIVE Final  ?  Comment: (NOTE) ?SARS-CoV-2 target nucleic acids are NOT DETECTED. ? ?The SARS-CoV-2 RNA is generally detectable in upper respiratory ?specimens during the acute phase of infection. The lowest ?concentration of SARS-CoV-2 viral copies this assay can detect is ?138 copies/mL. A negative result does not preclude SARS-Cov-2 ?infection and should not be used as the sole basis for treatment or ?other patient management decisions. A negative result may occur with  ?improper specimen collection/handling, submission of specimen other ?than nasopharyngeal swab, presence  of viral mutation(s) within the ?areas targeted by this assay, and inadequate number of viral ?copies(<138 copies/mL). A negative result must be combined with ?clinical observations, patient history, and epidemiological ?information. The expected result is Negative. ? ?Fact Sheet for Patients:  ?BloggerCourse.com ? ?Fact Sheet for Healthcare Providers:  ?SeriousBroker.it ? ?This test is no t yet approved or cleared by the [place] FDA and  ?has been authorized for detection and/or diagnosis of SARS-CoV-2 by ?FDA under an Emergency Use Authorization (EUA). This EUA will remain  ?in effect (meaning this test can be used) for the duration of the ?COVID-19 declaration under Section 564(b)(1) of the Act, 21 ?U.S.C.section 360bbb-3(b)(1), unless the authorization is terminated  ?or revoked sooner.  ? ? ?  ? Influenza A by PCR NEGATIVE NEGATIVE Final  ? Influenza B by PCR NEGATIVE NEGATIVE Final  ?  Comment: (NOTE) ?The Xpert Xpress SARS-CoV-2/FLU/RSV plus assay is intended as an aid ?in the diagnosis of influenza from Nasopharyngeal swab specimens and ?should not be used as a sole basis  for treatment. Nasal washings and ?aspirates are unacceptable for Xpert Xpress SARS-CoV-2/FLU/RSV ?testing. ? ?Fact Sheet for Patients: ?BloggerCourse.com ? ?Fact Sheet for [place]

## 2021-05-08 NOTE — ED Notes (Signed)
RN notified Dr that pt passed swallow eval  ?

## 2021-05-08 NOTE — ED Notes (Signed)
Pt transported to MRI 

## 2021-05-08 NOTE — Evaluation (Signed)
Speech Language Pathology Evaluation ?Patient Details ?Name: [name] ?MRN: [id number] ?DOB: 22-Dec-1960 ?Today's Date: 05/08/2021 ?Time: [id number] ?SLP Time Calculation (min) (ACUTE ONLY): 20 min ? ?Problem List:  ?Patient Active Problem List  ? Diagnosis Date Noted  ? Aphasia 05/08/2021  ? Leukocytosis 05/08/2021  ? Right below-knee amputee ([place]) 09/03/2019  ? Hyponatremia 08/26/2019  ? Cutaneous abscess of right foot   ? Poorly controlled diabetes mellitus ([place]) 07/16/2019  ? FHx: heart disease 10/08/2017  ? Type 2 diabetes mellitus with stage 2 chronic kidney disease, without long-term current use of insulin ([place]) 10/08/2017  ? Insomnia 07/08/2017  ? Smoker 08/17/2015  ? COPD (chronic obstructive pulmonary disease) ([place]) 08/17/2015  ? T2_NIDDM w/Peripheral Neuropathy 04/01/2014  ? CKD stage 2 due to type 2 diabetes mellitus ([place]) 08/31/2013  ? Medication management 05/19/2013  ? Morbid obesity (BMI 35) 05/19/2013  ? Essential hypertension   ? Hyperlipidemia associated with type 2 diabetes mellitus ([place])   ? Vitamin D deficiency   ? History of kidney stones   ? Testosterone deficiency   ? History of hepatitis C   ? ?Past Medical History:  ?Past Medical History:  ?Diagnosis Date  ? Diabetic neuropathy ([place])   ? Diverticulitis   ? History of hepatitis C   ? has been treated in the past  ? History of kidney stones   ? Hyperlipidemia   ? Hypertension   ? 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[place])   ? Other testicular hypofunction   ? Severe sepsis ([place]) 08/26/2019  ? Type II or unspecified type diabetes mellitus without mention of complication, not stated as uncontrolled   ? Vitamin D deficiency   ? ?Past Surgical History:  ?Past Surgical History:  ?Procedure Laterality Date  ? AMPUTATION Right 09/01/2019  ? Procedure: RIGHT BELOW KNEE AMPUTATION;  Surgeon: Newt Minion, MD;  Location: [facility];  Service: Orthopedics;  Laterality: Right;  ? CHOLECYSTECTOMY  1987  ? COLONOSCOPY    ? I & D EXTREMITY Right 08/20/2019  ? Procedure:  IRRIGATION & DEBRIDEMENT RIGHT FOOT PARTIAL CALCANEAL EXCISION;  Surgeon: Newt Minion, MD;  Location: [facility];  Service: Orthopedics;  Laterality: Right;  ? I & D EXTREMITY Right 08/27/2019  ? Procedure: IRRIGATION AND DEBRIDEMENT  OF FOOT;  Surgeon: Leandrew Koyanagi, MD;  Location: [facility];  Service: Orthopedics;  Laterality: Right;  ? ORIF TIBIA FRACTURE    ? x 3 right leg  ? ?HPI:  ?[name] is a 61 y.o. male who  presented to the emergency room for evaluation of sudden onset of word finding difficulty.  He was evaluated in the emergency room and his symptoms had mostly resolved.  CT head was done which revealed old left MCA territory infarcts.  MRI brain was completed which showed a moderate area of left frontal lobe acute infarction in addition to the chronic infarcts in the left frontoparietal regions. PMHx singnificant for diabetes, hepatitis C, hypertension, hyperlipidemia.  ? ?Assessment / Plan / Recommendation ?Clinical Impression ? Pt presents with a mild expressive aphasia, improved since last night but still present and c/b impaired propositional speech.  Comprehension is [facility].Naming to confrontation is [facility]. Responsive naming 80% accuracy, notable for delays when retrieving correct answers.  Divergent naming tasks were significantly impaired, generating only 3-4 items per category. When asked to talk about work or [place], he had difficulty constructing sentences with naming errors much more apparent.  He tended to rely on fillers "let's see here" or the  more definitive "aw, shit" when he couldn't retrieve the necessary word. Pt will benefit from aphasia therapy.  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 He feels his wife can provide the support needed upon D/C. Will await recommendations from OT/PT. ?   ?SLP Assessment ? SLP Recommendation/Assessment: Patient needs continued [facility] ?SLP Visit Diagnosis: Aphasia (R47.01)   ?  ?Recommendations for follow up therapy are one component of a multi-disciplinary discharge planning process, led by the attending physician.  Recommendations may be updated based on patient status, additional functional criteria and insurance authorization. ?   ?Follow Up Recommendations ? Other (comment) (await OT and PT evals)  ?  ?Assistance Recommended at Discharge ? Intermittent Supervision/Assistance  ?Functional Status Assessment Patient has had a recent decline in their functional status and demonstrates the ability to make significant improvements in function in a reasonable and predictable amount of time.  ?Frequency and Duration min 2x/week  ?1 week ?  ?   ?SLP Evaluation ?Cognition ? Overall Cognitive Status: Difficult to assess  ?  ?   ?Comprehension ? Auditory Comprehension ?Overall Auditory Comprehension: Appears within functional limits for tasks assessed ?Yes/No Questions: Within Functional Limits ?Commands: Within Functional Limits ?Conversation: Simple ?Visual Recognition/Discrimination ?Discrimination: Within Function Limits ?Reading Comprehension ?Reading Status: Not tested  ?  ?Expression Expression ?Primary Mode of Expression: Verbal ?Verbal Expression ?Overall Verbal Expression: Impaired ?Initiation: No impairment ?Automatic Speech: Name;Social Response;Counting ?Level of Generative/Spontaneous Verbalization: Sentence ?Repetition: No impairment ?Naming: Impairment ?Responsive: 51-75% accurate ?Confrontation: Within functional limits ?Divergent: 0-24% accurate ?Pragmatics: No impairment ?Written Expression ?Dominant Hand: Right   ?Oral / Motor ? Oral Motor/Sensory Function ?Overall Oral Motor/Sensory Function: Within functional limits ?Motor Speech ?Overall Motor Speech: Appears within functional limits for tasks assessed   ?        ? ?[name] ?05/08/2021, 4:45 PM ?Estill Bamberg L. Lalita Ebel, MA CCC/SLP ?Acute Rehabilitation Services ?Office number [phone] ?Pager  [phone] ? ?

## 2021-05-09 DIAGNOSIS — I6330 Cerebral infarction due to thrombosis of unspecified cerebral artery: Secondary | ICD-10-CM

## 2021-05-09 DIAGNOSIS — I10 Essential (primary) hypertension: Secondary | ICD-10-CM

## 2021-05-09 DIAGNOSIS — E785 Hyperlipidemia, unspecified: Secondary | ICD-10-CM

## 2021-05-09 DIAGNOSIS — I739 Peripheral vascular disease, unspecified: Secondary | ICD-10-CM

## 2021-05-09 DIAGNOSIS — E1169 Type 2 diabetes mellitus with other specified complication: Secondary | ICD-10-CM

## 2021-05-09 DIAGNOSIS — I6522 Occlusion and stenosis of left carotid artery: Secondary | ICD-10-CM

## 2021-05-09 LAB — LIPID PANEL
Cholesterol: 88 mg/dL (ref 0–200)
HDL: 20 mg/dL — ABNORMAL LOW (ref >40)
LDL Cholesterol: 23 mg/dL (ref 0–99)
Total CHOL/HDL Ratio: 4.4 RATIO
Triglycerides: 223 mg/dL — ABNORMAL HIGH (ref <150)
VLDL: 45 mg/dL — ABNORMAL HIGH (ref 0–40)

## 2021-05-09 LAB — GLUCOSE, CAPILLARY
Glucose-Capillary: 152 mg/dL — ABNORMAL HIGH (ref 70–99)
Glucose-Capillary: 254 mg/dL — ABNORMAL HIGH (ref 70–99)
Glucose-Capillary: 126 mg/dL — ABNORMAL HIGH (ref 70–99)

## 2021-05-09 LAB — HEMOGLOBIN A1C
Hgb A1c MFr Bld: 7.5 % — ABNORMAL HIGH (ref 4.8–5.6)
Mean Plasma Glucose: 168.55 mg/dL

## 2021-05-09 LAB — GAMMA GT: GGT: 17 U/L (ref 7–50)

## 2021-05-09 LAB — LACTATE DEHYDROGENASE: LDH: 182 U/L (ref 98–192)

## 2021-05-09 LAB — CK: Total CK: 313 U/L (ref 49–397)

## 2021-05-09 LAB — URIC ACID: Uric Acid, Serum: 6 mg/dL (ref 3.7–8.6)

## 2021-05-09 MED ORDER — STUDY - OCEANIC-STROKE - ASUNDEXIAN 50 MG OR PLACEBO TABLET (PI-SETHI)
1.0000 | ORAL_TABLET | Freq: Every day | ORAL | Status: DC
  Administered 2021-05-09: 50 mg via ORAL
  Filled 2021-05-09: qty 1

## 2021-05-09 MED ORDER — CLOPIDOGREL BISULFATE 75 MG PO TABS: 75.0000 mg | ORAL_TABLET | Freq: Every day | ORAL | 3 refills | Status: DC

## 2021-05-09 MED ORDER — ASPIRIN EC 81 MG PO TBEC
81.0000 mg | DELAYED_RELEASE_TABLET | Freq: Every day | ORAL | Status: DC
Start: 2021-05-10 — End: 2021-05-09

## 2021-05-09 MED ORDER — ASPIRIN 81 MG PO TABS: 81.0000 mg | ORAL_TABLET | Freq: Every day | ORAL | Status: DC

## 2021-05-09 MED ORDER — ASPIRIN 325 MG PO TBEC: 325.0000 mg | DELAYED_RELEASE_TABLET | Freq: Every day | ORAL | 0 refills | Status: DC

## 2021-05-09 MED ORDER — PANTOPRAZOLE SODIUM 40 MG PO TBEC: 40.0000 mg | DELAYED_RELEASE_TABLET | Freq: Every day | ORAL | 1 refills | Status: DC

## 2021-05-09 MED ORDER — STUDY - OCEANIC-STROKE - ASUNDEXIAN 50 MG OR PLACEBO TABLET (PI-SETHI): 1.0000 | ORAL_TABLET | Freq: Every day | ORAL | 0 refills | Status: DC

## 2021-05-09 NOTE — Discharge Summary (Addendum)
? ?PATIENT DETAILS ?Name: [name] ?Age: 61 y.o. ?Sex: male ?Date of Birth: 10/09/1960 ?MRN: [id number]. ?Admitting Physician: John GiovanniVasundhra Rathore, MD ?WUJ:[id number]:McKeown, Chrissie NoaWilliam, MD ? ?Admit Date: 05/07/2021 ?Discharge date: 05/09/2021 ? ?Recommendations for Outpatient Follow-up:  ?Follow up with PCP in 1-2 weeks ?Please obtain CMP/CBC in one week ?Please ensure follow-up with stroke clinic. ?Continue counseling regarding importance of quitting tobacco use. ? ?Admitted From:  ?Home ? ?Disposition: ?Home ?  ?Discharge Condition: ?fair ? ?CODE STATUS: ?  Code Status: Full Code  ? ?Diet recommendation:  ?Diet Order   ? ?       ?  Diet - low sodium heart healthy       ?  ?  Diet Carb Modified Fluid consistency: Thin; Room service appropriate? Yes  Diet effective now       ?  ? ?  ?  ? ?  ?  ? ?Brief Summary: ?Patient is 61 year old male with history of DM-2, HTN, PAD s/p [name]-who presented with sudden onset of aphasia (resolved)-further work-up revealed a CVA.  ?  ? ?Brief Hospital Course: ?Acute CVA: Presented with transient aphasia-thankfully he recovered quickly and he feels he is back to his baseline.  He did not have any other focal deficits.  Work-up revealed left frontal lobe infarct, echo with preserved EF, CT of the head/neck showed chronic occlusion of the left internal carotid artery from the bifurcation to the ophthalmic segment.  A1c 7.5, LDL 23.  Discussed with stroke MD-Dr. Xu-recommendations are to continue aspirin/Plavix x3 months followed by Plavix alone.  Already on a high intensity statins.  Stroke team has discussed with patient-he will be enrolled in a stroke trial-once he is randomized-he is cleared for discharge.  Please ensure outpatient follow-up with stroke clinic. ? ?DM-2: CBG stable-resume metformin-recently started on Trulicity.  Follow-up with PCP for further optimization. ? ?HLD: Continue high intensity statin ? ?HTN: Initially permissive hypertension was allowed-plan is to resume usual  antihypertensive regimen on discharge. ? ?History of PAD s/p [name] ? ?Tobacco use: Counseled-he intends to quit. ? ?Obesity: ?Estimated body mass index is 34.28 kg/m? as calculated from the following: ?  Height as of this encounter: 6\' 2"  (1.88 m). ?  Weight as of 04/18/21: 121.1 kg.  ? ?Discharge Diagnoses:  ?Principal Problem: ?  Aphasia ?Active Problems: ?  Essential hypertension ?  Hyperlipidemia associated with type 2 diabetes mellitus ([name]) ?  Leukocytosis ?  Cerebral thrombosis with cerebral infarction ? ? ?Discharge Instructions: ? ?Activity:  ?As tolerated with Full fall precautions use walker/cane & assistance as needed ? ? ?Discharge Instructions   ? ? Ambulatory referral to Neurology   Complete by: As directed ?  ? An appointment is requested in approximately: 8 weeks  ? Ambulatory referral to Occupational Therapy   Complete by: As directed ?  ? Ambulatory referral to Speech Therapy   Complete by: As directed ?  ? Diet - low sodium heart healthy   Complete by: As directed ?  ? Discharge instructions   Complete by: As directed ?  ? Follow with Primary MD  Lucky CowboyMcKeown, William, MD in 1-2 weeks ? ?Take aspirin along with Plavix for 3 months-after 3 months stop aspirin and continue on just Plavix. ? ?You will get a phone call from stroke clinic, if you do not hear from them-please give them a call. ? ?Please get a complete blood count and chemistry panel checked by your Primary MD at your next visit, and again as  instructed by your Primary MD. ? ?Get Medicines reviewed and adjusted: ?Please take all your medications with you for your next visit with your Primary MD ? ?Laboratory/radiological data: ?Please request your Primary MD to go over all hospital tests and procedure/radiological results at the follow up, please ask your Primary MD to get all Hospital records sent to his/her office. ? ?In some cases, they will be blood work, cultures and biopsy results pending at the time of your discharge. Please  request that your primary care M.D. follows up on these results. ? ?Also Note the following: ?If you experience worsening of your admission symptoms, develop shortness of breath, life threatening emergency, suicidal or homicidal thoughts you must seek medical attention immediately by calling 911 or calling your MD immediately  if symptoms less severe. ? ?You must read complete instructions/literature along with all the possible adverse reactions/side effects for all the Medicines you take and that have been prescribed to you. Take any new Medicines after you have completely understood and accpet all the possible adverse reactions/side effects.  ? ?Do not drive when taking Pain medications or sleeping medications (Benzodaizepines) ? ?Do not take more than prescribed Pain, Sleep and Anxiety Medications. It is not advisable to combine anxiety,sleep and pain medications without talking with your primary care practitioner ? ?Special Instructions: If you have smoked or chewed Tobacco  in the last 2 yrs please stop smoking, stop any regular Alcohol  and or any Recreational drug use. ? ?Wear Seat belts while driving. ? ?Please note: ?You were cared for by a hospitalist during your hospital stay. Once you are discharged, your primary care physician will handle any further medical issues. Please note that NO REFILLS for any discharge medications will be authorized once you are discharged, as it is imperative that you return to your primary care physician (or establish a relationship with a primary care physician if you do not have one) for your post hospital discharge needs so that they can reassess your need for medications and monitor your lab values.  ? Increase activity slowly   Complete by: As directed ?  ? ?  ? ?Allergies as of 05/09/2021   ? ?   Reactions  ? Invokamet [canagliflozin-metformin Hcl] Other (See Comments)  ? Extremity edema/caused pain  ? Invokana [canagliflozin] Other (See Comments)  ? Extremity  edema/caused pain  ? Codeine Camsylate [codeine] Rash  ? Morphine And Related Rash  ? ?  ? ?  ?Medication List  ?  ? ?TAKE these medications   ? ?aspirin 81 MG tablet ?Take 1 tablet (81 mg total) by mouth daily. Take along with plavix for 3 months, after 3 months stop aspirin and continue plavix ?What changed: additional instructions ?  ?cetirizine 10 MG tablet ?Commonly known as: ZYRTEC ?Take 10 mg by mouth daily as needed for allergies. ?  ?citalopram 40 MG tablet ?Commonly known as: CELEXA ?Take 1 tablet Daily for Mood & Chronic Anxiety ?  ?clopidogrel 75 MG tablet ?Commonly known as: PLAVIX ?Take 1 tablet (75 mg total) by mouth daily. ?Start taking on: May 10, 2021 ?  ?FreeStyle [employer] Misc ?1 each by Does not apply route daily. ?  ?hydrochlorothiazide 25 MG tablet ?Commonly known as: HYDRODIURIL ?Take  1 tablet  Daily  for BP & Fluid Retention / Ankle Swelling ?What changed:  ?how much to take ?how to take this ?when to take this ?additional instructions ?  ?lisinopril 20 MG tablet ?Commonly known as: ZESTRIL ?Take 1  tablet Daily for BP & Kidney Protection ?  ?metFORMIN 500 MG 24 hr tablet ?Commonly known as: GLUCOPHAGE-XR ?Take 2 tablets 2 x /day with Meals  for Diabetes ?What changed:  ?how much to take ?when to take this ?  ?OCEANIC-STROKE asundexian or placebo 50 mg tablet ?Take 1 tablet (50 mg total) by mouth daily. For investigational use only. Take 1 tablet by mouth once daily at the same time each day, preferably in the morning. Please bring bottle to every visit. ?  ?pantoprazole 40 MG tablet ?Commonly known as: Protonix ?Take 1 tablet (40 mg total) by mouth daily. ?  ?rosuvastatin 40 MG tablet ?Commonly known as: CRESTOR ?Take one tablet daily for Cholesterol. ?What changed:  ?how much to take ?how to take this ?when to take this ?  ?Trulicity 0.75 MG/0.5ML Sopn ?Generic drug: Dulaglutide ?Inject 0.75 mg into the skin once a week. ?What changed: additional instructions ?  ?verapamil 240  MG CR tablet ?Commonly known as: CALAN-SR ?Take 1 tablet Daily with Food for BP & Heart  Rhythm ?What changed:  ?how much to take ?when to take this ?  ?Vitamin D 50 MCG (2000 UT) Caps ?Take 2,000 Units by mouth 2

## 2021-05-09 NOTE — Plan of Care (Signed)
?  Problem: Education: ?Goal: Knowledge of disease or condition will improve ?Outcome: Progressing ?Goal: Knowledge of secondary prevention will improve (SELECT ALL) ?Outcome: Progressing ?Goal: Knowledge of patient specific risk factors will improve (INDIVIDUALIZE FOR PATIENT) ?Outcome: Progressing ?  ?

## 2021-05-09 NOTE — Progress Notes (Signed)
Called and spoke with wife [name] to clarify that Dr. Erlinda Hong wants him on 81mg  of aspirin not 325mg  for the study. She voiced understanding.   ?

## 2021-05-09 NOTE — Evaluation (Addendum)
Physical Therapy Evaluation ?Patient Details ?Name: [name] ?MRN: [id number] ?DOB: 01-02-1961 ?Today's Date: 05/09/2021 ? ?History of Present Illness ? Patient is a 61 y/o male who presents on 05/08/21 with a sudden onset of aphasia. Brain MRI-left frontal lobe infarct. PMH includes HTN, DM, Hep C, diabetic neuropathy.  ?Clinical Impression ? Patient presents with language deficits and impaired balance s/p above. Pt lives at home with wife and works for an [employer]. Pt is independent for ADLs/IADLs. Today, pt requires Min guard-supervision for all mobility. Noted to have some mild balance deficits during ambulation with some drifting but no overt LOB, likely due to being in bed since yesterday. Difficulty with dual tasking and naming tasks with walking. Reviewed BeFAST with patient. Will follow acutely for higher level balance activities and to maximize independence prior to return home. ? ? ?   ?   ? ?Recommendations for follow up therapy are one component of a multi-disciplinary discharge planning process, led by the attending physician.  Recommendations may be updated based on patient status, additional functional criteria and insurance authorization. ? ?Follow Up Recommendations No PT follow up ? ?  ?Assistance Recommended at Discharge PRN  ?Patient can return home with the following ? Assistance with cooking/housework ? ?  ?Equipment Recommendations None recommended by PT  ?Recommendations for Other Services ?    ?  ?Functional Status Assessment Patient has had a recent decline in their functional status and demonstrates the ability to make significant improvements in function in a reasonable and predictable amount of time.  ? ?  ?Precautions / Restrictions Precautions ?Precautions: Other (comment) ?Precaution Comments: [name] ?Restrictions ?Weight Bearing Restrictions: No  ? ?  ? ?Mobility ? Bed Mobility ?Overal bed mobility: Modified Independent ?  ?  ?  ?  ?  ?  ?General bed mobility  comments: Increased time, no assist needed. ?  ? ?Transfers ?Overall transfer level: Needs assistance ?Equipment used: None ?Transfers: Sit to/from Stand ?Sit to Stand: Min guard ?  ?  ?  ?  ?  ?General transfer comment: Stood from EOB x1, no assist needed. ?  ? ?Ambulation/Gait ?Ambulation/Gait assistance: Min guard, Supervision ?Gait Distance (Feet): 400 Feet ?Assistive device: None ?Gait Pattern/deviations: Step-through pattern, Decreased stride length, Drifts right/left ?  ?Gait velocity interpretation: 1.31 - 2.62 ft/sec, indicative of limited community ambulator ?  ?General Gait Details: Slow, mostly steady gait with drifting in both directions but no overt LOB. Reports feeling at baseline with walking/strength. ? ?Stairs ?  ?  ?  ?  ?  ? ?Wheelchair Mobility ?  ? ?Modified Rankin (Stroke Patients Only) ?Modified Rankin (Stroke Patients Only) ?Pre-Morbid Rankin Score: No significant disability ?Modified Rankin: Slight disability ? ?  ? ?Balance Overall balance assessment: Needs assistance ?Sitting-balance support: Feet supported, No upper extremity supported ?Sitting balance-Leahy Scale: Good ?Sitting balance - Comments: Able to donn prosthesis and shoe without difficulty reaching outside BoS. ?  ?Standing balance support: During functional activity ?Standing balance-Leahy Scale: Fair ?  ?  ?  ?  ?  ?  ?  ?  ?  ?  ?  ?  ?   ? ? ? ?Pertinent Vitals/Pain Pain Assessment ?Pain Assessment: No/denies pain  ? ? ?Home Living Family/patient expects to be discharged to:: Private residence ?Living Arrangements: Spouse/significant other ?Available Help at Discharge: Family;Available PRN/intermittently ?Type of Home: House ?Home Access: Ramped entrance ?  ?  ?  ?Home Layout: One level ?Home Equipment: Wheelchair - manual ?   ?  ?  Prior Function Prior Level of Function : Independent/Modified Independent ?  ?  ?  ?  ?  ?  ?Mobility Comments: Drives, cooks, cleans, works. Uses w/c for showers. has a dog and cat ?ADLs  Comments: independent ?  ? ? ?Hand Dominance  ? Dominant Hand: Left ? ?  ?Extremity/Trunk Assessment  ? Upper Extremity Assessment ?Upper Extremity Assessment: Defer to OT evaluation ?  ? ?Lower Extremity Assessment ?Lower Extremity Assessment: Overall WFL for tasks assessed;RLE deficits/detail ?RLE Deficits / Details: Rt BKA ?  ? ?   ?Communication  ? Communication: No difficulties  ?Cognition Arousal/Alertness: Awake/alert ?Behavior During Therapy: [facility] for tasks assessed/performed ?Overall Cognitive Status: Difficult to assess ?  ?  ?  ?  ?  ?  ?  ?  ?  ?  ?  ?  ?  ?  ?  ?  ?General Comments: A&Ox4, language seems improved today however still with word finding difficulties. Slower processing/response time. Follows commands well. ?  ?  ? ?  ?General Comments General comments (skin integrity, edema, etc.): VSS on RA. Some mild SOB noted with mobility. ? ?  ?Exercises    ? ?Assessment/Plan  ?  ?PT Assessment Patient needs continued PT services  ?PT Problem List Decreased balance;Decreased cognition;Impaired sensation ? ?   ?  ?PT Treatment Interventions Therapeutic exercise;Patient/family education;Therapeutic activities;Gait training;Balance training;Neuromuscular re-education   ? ?PT Goals (Current goals can be found in the Care Plan section)  ?Acute Rehab PT Goals ?Patient Stated Goal: to go home ?PT Goal Formulation: With patient ?Time For Goal Achievement: 05/23/21 ?Potential to Achieve Goals: Good ? ?  ?Frequency Min 4X/week ?  ? ? ?Co-evaluation   ?  ?  ?  ?  ? ? ?  ?AM-PAC PT "6 Clicks" Mobility  ?Outcome Measure Help needed turning from your back to your side while in a flat bed without using bedrails?: None ?Help needed moving from lying on your back to sitting on the side of a flat bed without using bedrails?: None ?Help needed moving to and from a bed to a chair (including a wheelchair)?: None ?Help needed standing up from a chair using your arms (e.g., wheelchair or bedside chair)?: None ?Help needed to  walk in hospital room?: A Little ?Help needed climbing 3-5 steps with a railing? : A Little ?6 Click Score: 22 ? ?  ?End of Session Equipment Utilized During Treatment: Gait belt ?Activity Tolerance: Patient tolerated treatment well ?Patient left: in bed;with call bell/phone within reach (sitting EOB) ?Nurse Communication: Mobility status ?PT Visit Diagnosis: Unsteadiness on feet (R26.81) ?  ? ?Time: 5364-6803 ?PT Time Calculation (min) (ACUTE ONLY): 20 min ? ? ?Charges:   PT Evaluation ?$PT Eval Moderate Complexity: 1 Mod ?  ?  ?   ? ? ?Vale Haven, PT, DPT ?Acute Rehabilitation Services ?Secure chat preferred ?Office [phone] ? ? ? ? ?Blake Divine A Brieanne Mignone ?05/09/2021, 8:50 AM ? ?

## 2021-05-09 NOTE — Progress Notes (Signed)
Discharge teaching complete. Meds, diet, activity, follow up appointments reviewed and all questions answered. Copy of instructions given to patient and prescriptions sent to pharmacy. Patient discharged home via wheelchair with wife.  ?

## 2021-05-09 NOTE — TOC Transition Note (Signed)
Transition of Care (TOC) - CM/SW Discharge Note ? ? ?Patient Details  ?Name: [name] ?MRN: [id number] ?Date of Birth: 30-Apr-1960 ? ?Transition of Care (TOC) CM/SW Contact:  ?Kermit Balo, RN ?Phone Number: ?05/09/2021, 1:49 PM ? ? ?Clinical Narrative:    ?Patient is discharging home with outpatient therapy through [facility]. Orders in Epic and information on the AVS.  ?Pt has needed DME (walker, wheelchair, shower seat, cane) at home. Wife manages his medications and can provide transportation. ?Wife is transporting home today. ? ? ? ?Final next level of care: OP Rehab ?Barriers to Discharge: No Barriers Identified ? ? ?Patient Goals and CMS Choice ?  ?  ?Choice offered to / list presented to : Patient, Spouse ? ?Discharge Placement ?  ?           ?  ?  ?  ?  ? ?Discharge Plan and Services ?  ?  ?           ?  ?  ?  ?  ?  ?  ?  ?  ?  ?  ? ?Social Determinants of Health (SDOH) Interventions ?  ? ? ?Readmission Risk Interventions ? ?  09/02/2019  ?  1:53 PM  ?Readmission Risk Prevention Plan  ?Post Dischage Appt Complete  ?Medication Screening Complete  ?Transportation Screening Complete  ? ? ? ? ? ?

## 2021-05-09 NOTE — Evaluation (Addendum)
Occupational Therapy Evaluation ?Patient Details ?Name: [name] ?MRN: [id number] ?DOB: 12-04-1960 ?Today's Date: 05/09/2021 ? ? ?History of Present Illness 61 y.o. male presented to the emergency room for evaluation of sudden onset of word finding difficulty. MRI + moderate acute infarct L frontal lob. Past medical history of diabetes, hepatitis C, hypertension, hyperlipidemia, R BKA, COPD  ? ?Clinical Impression ?  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 Pt requires assistance with IADL tasks due to deficits listed below. Recommend pt follow up with outpt OT to maximize independence and facilitate safe and successful return to work. Acute OTto follow.   ?   ? ?Recommendations for follow up therapy are one component of a multi-disciplinary discharge planning process, led by the attending physician.  Recommendations may be updated based on patient status, additional functional criteria and insurance authorization.  ? ?Follow Up Recommendations ? Outpatient OT (neuro outpt)  ?  ?Assistance Recommended at Discharge Intermittent Supervision/Assistance  ?Patient can return home with the following Assistance with cooking/housework;Direct supervision/assist for medications management;Direct supervision/assist for financial management;Assist for transportation;Help with stairs or ramp for entrance ? ?  ?Functional Status Assessment ? Patient has had a recent decline in their functional status and demonstrates the ability to make significant improvements in function in a reasonable and predictable amount of time.  ?Equipment Recommendations ? None recommended by OT  ?  ?Recommendations for Other Services   ? ? ?  ?Precautions / Restrictions Precautions ?Precautions: Other (comment) ?Precaution Comments: [name] ?Required Braces or Orthoses:  (prsthetic RLE) ?Restrictions ?Weight Bearing Restrictions: No  ? ?  ? ?Mobility Bed Mobility ?Overal bed mobility: Modified  Independent ?  ?  ?  ?  ?  ?  ?General bed mobility comments: Increased time, no assist needed. ?  ? ?Transfers ?Overall transfer level: Needs assistance ?Equipment used: None ?Transfers: Sit to/from Stand ?Sit to Stand: Min guard ?  ?  ?  ?  ?  ?General transfer comment: Stood from EOB x1, no assist needed. ?  ? ?  ?Balance Overall balance assessment: Needs assistance ?Sitting-balance support: Feet supported, No upper extremity supported ?Sitting balance-Leahy Scale: Good ?Sitting balance - Comments: Able to donn prosthesis and shoe without difficulty reaching outside BoS. ?  ?Standing balance support: During functional activity ?Standing balance-Leahy Scale: Fair ?  ?  ?  ?  ?  ?  ?  ?  ?  ?  ?  ?  ?   ? ?ADL either performed or assessed with clinical judgement  ? ?ADL Overall ADL's : Needs assistance/impaired ?  ?  ?Grooming: Set up ?  ?Upper Body Bathing: Set up ?  ?Lower Body Bathing: Supervison/ safety;Set up ?  ?Upper Body Dressing : Set up ?  ?Lower Body Dressing: Set up;Supervision/safety ?  ?Toilet Transfer: Ambulation;Supervision/safety ?  ?Toileting- Clothing Manipulation and Hygiene: Supervision/safety ?  ?  ?  ?Functional mobility during ADLs: Min guard ?   ? ? ? ?Vision Baseline Vision/History: 1 Wears glasses ?Patient Visual Report: No change from baseline ?Vision Assessment?: No apparent visual deficits ?Additional Comments: will further assess  ?   ?Perception   ?  ?Praxis   ?  ? ?Pertinent Vitals/Pain    ? ? ? ?Hand Dominance Left ?  ?Extremity/Trunk Assessment Upper Extremity Assessment ?Upper Extremity Assessment: Overall WFL for tasks assessed;LUE deficits/detail ?LUE Deficits / Details: shoulder deficits at baseline from previous injury; functional ?  ?Lower Extremity Assessment ?RLE Deficits /  Details: Rt BKA ?  ?Cervical / Trunk Assessment ?Cervical / Trunk Assessment: Normal ?  ?Communication Communication ?Communication: No difficulties ?  ?Cognition Arousal/Alertness:  Awake/alert ?Behavior During Therapy: [facility] for tasks assessed/performed ?Overall Cognitive Status: Difficult to assess ?  ?  ?  ?  ?  ?  ?  ?  ?  ?  ?  ?  ?  ?  ?  ?  ?General Comments: word finding difficulties. Slower processing/response time.given directions to walk to the door then stop at the sink. Difficulty processing information to locate items/words on menu. Difficulty wtih multipstep commands; feeling like he is "in a fog" ?  ?  ?General Comments    ? ?  ?Exercises   ?  ?Shoulder Instructions    ? ? ?Home Living Family/patient expects to be discharged to:: Private residence ?Living Arrangements: Spouse/significant other ?Available Help at Discharge: Family;Available PRN/intermittently ?Type of Home: House ?Home Access: Ramped entrance ?  ?  ?Home Layout: One level ?  ?  ?Bathroom Shower/Tub: Walk-in shower ?  ?Bathroom Toilet: Standard ?Bathroom Accessibility: Yes ?How Accessible: Accessible via wheelchair ?Home Equipment: Wheelchair - Forensic psychologist (2 wheels);Tub bench;Grab bars - tub/shower ?  ?  ? Lives With: Spouse ? ?  ?Prior Functioning/Environment Prior Level of Function : Independent/Modified Independent ?  ?  ?  ?  ?  ?  ?Mobility Comments: Drives, cooks, cleans, works. Uses w/c for showers. has a dog and cat ?ADLs Comments: independent ?  ? ?  ?  ?OT Problem List: Decreased cognition;Decreased safety awareness ?  ?   ?OT Treatment/Interventions: Self-care/ADL training;DME and/or AE instruction;Therapeutic activities;Patient/family education;Visual/perceptual remediation/compensation;Cognitive remediation/compensation  ?  ?OT Goals(Current goals can be found in the care plan section) Acute Rehab OT Goals ?Patient Stated Goal: to get better ?OT Goal Formulation: With patient/family ?Time For Goal Achievement: 05/23/21 ?Potential to Achieve Goals: Good  ?OT Frequency: Min 2X/week ?  ? ?Co-evaluation   ?  ?  ?  ?  ? ?  ?AM-PAC OT "6 Clicks" Daily Activity     ?Outcome Measure Help from another  person eating meals?: None ?Help from another person taking care of personal grooming?: A Little ?Help from another person toileting, which includes using toliet, bedpan, or urinal?: A Little ?Help from another person bathing (including washing, rinsing, drying)?: A Little ?Help from another person to put on and taking off regular upper body clothing?: A Little ?Help from another person to put on and taking off regular lower body clothing?: A Little ?6 Click Score: 19 ?  ?End of Session Equipment Utilized During Treatment: Gait belt ?Nurse Communication: Mobility status;Other (comment) (DC needs) ? ?Activity Tolerance: Patient tolerated treatment well ?Patient left: in bed;with call bell/phone within reach;with bed alarm set;with family/visitor present ? ?OT Visit Diagnosis: Unsteadiness on feet (R26.81);Other symptoms and signs involving cognitive function;Cognitive communication deficit (R41.841) ?Symptoms and signs involving cognitive functions: Cerebral infarction  ?              ?Time: 2951-8841 ?OT Time Calculation (min): 22 min ?Charges:  OT General Charges ?$OT Visit: 1 Visit ?OT Evaluation ?$OT Eval Moderate Complexity: 1 Mod ? ?[facility], OT/L  ? ?Acute OT Clinical Specialist ?Acute Rehabilitation Services ?Pager [phone] ?Office [phone]  ? ?Murry Diaz,HILLARY ?05/09/2021, 1:54 PM ?

## 2021-05-10 NOTE — TOC CAGE-AID Note (Signed)
Transition of Care (TOC) - CAGE-AID Screening ? ? ?Patient Details  ?Name: [name] ?MRN: [id number] ?Date of Birth: [birth year] ? ?Transition of Care (TOC) CM/SW Contact:    ?Saraiyah Hemminger C Tarpley-Carter, LCSWA ?Phone Number: ?05/10/2021, 9:47 AM ? ? ?Clinical Narrative: ?Pt participated in Cage-Aid.  Pt stated he does use substance.  Pt was offered resources, due to usage of substance. ? ?Insurance underwriter, MSW, LCSW-A ?Pronouns:  She/Her/Hers ?[facility] ?Clinical Social Worker ?Direct Number:  [phone] ?[email or web address] ?    ?CAGE-AID Screening: ?  ? ?Have You Ever Felt You Ought to Cut Down on Your Drinking or Drug Use?: No ?Have People Annoyed You By Critizing Your Drinking Or Drug Use?: No ?Have You Felt Bad Or Guilty About Your Drinking Or Drug Use?: No ?Have You Ever Had a Drink or Used Drugs First Thing In The Morning to Steady Your Nerves or to Get Rid of a Hangover?: No ?CAGE-AID Score: 0 ? ?Substance Abuse Education Offered: Yes ? ?Substance abuse interventions: Educational Materials ? ? ? ? ? ? ?

## 2021-05-10 NOTE — Progress Notes (Signed)
STROKE TEAM PROGRESS NOTE  ? ?SUBJECTIVE (INTERVAL HISTORY) ?His wife is at the bedside.  Overall his condition is rapidly improving. He sat at the edge of the bed and no obvious aphasia. Moving all extremities. He denies any stroke before or aware of any carotid occlusion. He is willing to quit smoke. Discussed with him about OCEANIC trial and he is willing to proceed.  ? ? ?OBJECTIVE ?Temp:  [98 ?F (36.7 ?C)-98.3 ?F (36.8 ?C)] 98.2 ?F (36.8 ?C) (04/19 1538) ?Pulse Rate:  [69-74] 70 (04/19 1538) ?Cardiac Rhythm: Normal sinus rhythm (04/19 1200) ?Resp:  [14-20] 14 (04/19 1538) ?BP: (111-135)/(71-82) 111/82 (04/19 1538) ?SpO2:  [96 %-99 %] 99 % (04/19 1538) ? ?Recent Labs  ?Lab 05/08/21 ?1715 05/08/21 ?2200 05/09/21 ?[id number] 05/09/21 ?1119 05/09/21 ?[id number]  ?GLUCAP 123* 172* 152* 254* 126*  ? ?Recent Labs  ?Lab 05/07/21 ?2046 05/07/21 ?2335  ?NA 135 136  ?K 4.3 4.5  ?CL 100 98  ?CO2 25  --   ?GLUCOSE 103* 81  ?BUN 21 26*  ?CREATININE 0.96 0.80  ?CALCIUM 9.3  --   ? ?Recent Labs  ?Lab 05/07/21 ?2046  ?AST 29  ?ALT 37  ?ALKPHOS 57  ?BILITOT 0.8  ?PROT 7.4  ?ALBUMIN 3.8  ? ?Recent Labs  ?Lab 05/07/21 ?2046 05/07/21 ?2335 05/08/21 ?1137  ?WBC 12.9*  --  9.1  ?NEUTROABS 6.7  --   --   ?HGB 15.3 15.6 14.7  ?HCT 45.2 46.0 42.2  ?MCV 94.8  --  93.4  ?PLT 204  --  172  ? ?Recent Labs  ?Lab 05/09/21 ?1634  ?CKTOTAL 313  ? ?Recent Labs  ?  05/07/21 ?2046  ?LABPROT 13.0  ?INR 1.0  ? ?Recent Labs  ?  05/08/21 ?1029  ?COLORURINE YELLOW  ?LABSPEC 1.025  ?PHURINE 5.0  ?GLUCOSEU NEGATIVE  ?HGBUR NEGATIVE  ?BILIRUBINUR NEGATIVE  ?KETONESUR NEGATIVE  ?PROTEINUR NEGATIVE  ?NITRITE NEGATIVE  ?LEUKOCYTESUR NEGATIVE  ?  ?   ?Component Value Date/Time  ? CHOL 88 05/09/2021 0312  ? TRIG 223 (H) 05/09/2021 [id number]  ? HDL 20 (L) 05/09/2021 [id number]  ? CHOLHDL 4.4 05/09/2021 0312  ? VLDL 45 (H) 05/09/2021 [id number]  ? [place] 23 05/09/2021 0312  ? LDLCALC 52 04/18/2021 1155  ? ?Lab Results  ?Component Value Date  ? HGBA1C 7.5 (H) 05/09/2021  ? ?   ?Component  Value Date/Time  ? LABOPIA NONE DETECTED 05/08/2021 1029  ? COCAINSCRNUR NONE DETECTED 05/08/2021 1029  ? LABBENZ NONE DETECTED 05/08/2021 1029  ? AMPHETMU NONE DETECTED 05/08/2021 1029  ? THCU POSITIVE (A) 05/08/2021 1029  ? LABBARB NONE DETECTED 05/08/2021 1029  ?  ?Recent Labs  ?Lab 05/07/21 ?2046  ?ETH <10  ? ? ?I have personally reviewed the radiological images below and agree with the radiology interpretations. ? ?CT HEAD WO CONTRAST ? ?Result Date: 05/07/2021 ?CLINICAL DATA:  Neurologic deficit. EXAM: CT HEAD WITHOUT CONTRAST TECHNIQUE: Contiguous axial images were obtained from the base of the skull through the vertex without intravenous contrast. RADIATION DOSE REDUCTION: This exam was performed according to the departmental dose-optimization program which includes automated exposure control, adjustment of the mA and/or kV according to patient size and/or use of iterative reconstruction technique. COMPARISON:  Head CT dated 04/05/2013. FINDINGS: Brain: Areas of old infarct and encephalomalacia involving the left frontal and posterior parietal lobes. The ventricles and sulci appropriate size for patient's age. There is a cavum septum pellucidum and cavum vergae. No acute intracranial hemorrhage. No mass effect or midline shift. No  extra-axial fluid collection. Vascular: No hyperdense vessel or unexpected calcification. Skull: Normal. Negative for fracture or focal lesion. Sinuses/Orbits: No acute finding. Other: None IMPRESSION: 1. No acute intracranial pathology. 2. Areas of old infarct and encephalomalacia involving the left frontal and posterior parietal lobes. Electronically Signed   By: Anner Crete M.D.   On: 05/07/2021 23:25  ? ?MR BRAIN WO CONTRAST ? ?Result Date: 05/08/2021 ?CLINICAL DATA:  TIA.  Sudden on set aphasia. EXAM: MRI HEAD WITHOUT CONTRAST TECHNIQUE: Multiplanar, multiecho pulse sequences of the brain and surrounding structures were obtained without intravenous contrast. COMPARISON:   Head CT from earlier today FINDINGS: Brain: Moderate area of acute cortical infarction at the lateral left frontal lobe. Just above the acute infarct is encephalomalacia from remote infarct involving a smaller area. Remote low left parietal cortex infarct. No acute hemorrhage, hydrocephalus, or collection. Generalized cerebral volume loss. Hemosiderin staining at sites of remote infarction. Vascular: No flow seen in the right cervical and cavernous carotid artery with normalized downstream flow void. Skull and upper cervical spine: Negative Sinuses/Orbits: No acute finding IMPRESSION: 1. Moderate area of acute cortical infarction at the left frontal lobe. 2. Pre-existing left frontal and parietal cortex infarcts. 3. Slow or absent flow in the left cervical and cavernous ICA with downstream normalization, suggest CTA. Electronically Signed   By: Jorje Guild M.D.   On: 05/08/2021 07:06  ? ?ECHOCARDIOGRAM COMPLETE ? ?Result Date: 05/08/2021 ?   ECHOCARDIOGRAM REPORT   Patient Name:   [name] Date of Exam: 05/08/2021 Medical Rec #:  [id number]      Height:       74.0 in Accession #:    [id number]     Weight:       267.0 lb Date of Birth:  [birth year]     BSA:          2.457 m? Patient Age:    61 years       BP:           142/68 mmHg Patient Gender: M              HR:           67 bpm. Exam Location:  Inpatient Procedure: 2D Echo, Cardiac Doppler, Color Doppler and Intracardiac            Opacification Agent Indications:    CVA  History:        Patient has no prior history of Echocardiogram examinations.                 Risk Factors:Hypertension, Diabetes and Dyslipidemia.  Sonographer:    Luisa Hart RDCS Referring Phys: [id number] Shela Leff  Sonographer Comments: Technically difficult study due to poor echo windows. IMPRESSIONS  1. Left ventricular ejection fraction, by estimation, is 60 to 65%. The left ventricle has normal function. The left ventricle has no regional wall motion abnormalities. There is  mild asymmetric left ventricular hypertrophy of the basal-septal segment. Left ventricular diastolic parameters are consistent with Grade I diastolic dysfunction (impaired relaxation).  2. Right ventricular systolic function is normal. The right ventricular size is normal. Tricuspid regurgitation signal is inadequate for assessing PA pressure.  3. The mitral valve is grossly normal. No evidence of mitral valve regurgitation. No evidence of mitral stenosis.  4. The aortic valve is tricuspid. There is mild calcification of the aortic valve. There is mild thickening of the aortic valve. Aortic valve regurgitation is not visualized. Aortic valve sclerosis is present, with  no evidence of aortic valve stenosis.  5. The inferior vena cava is normal in size with greater than 50% respiratory variability, suggesting right atrial pressure of 3 mmHg. Conclusion(s)/Recommendation(s): No intracardiac source of embolism detected on this transthoracic study. Consider a transesophageal echocardiogram to exclude cardiac source of embolism if clinically indicated. FINDINGS  Left Ventricle: Left ventricular ejection fraction, by estimation, is 60 to 65%. The left ventricle has normal function. The left ventricle has no regional wall motion abnormalities. Definity contrast agent was given IV to delineate the left ventricular  endocardial borders. The left ventricular internal cavity size was normal in size. There is mild asymmetric left ventricular hypertrophy of the basal-septal segment. Left ventricular diastolic parameters are consistent with Grade I diastolic dysfunction  (impaired relaxation). Right Ventricle: The right ventricular size is normal. No increase in right ventricular wall thickness. Right ventricular systolic function is normal. Tricuspid regurgitation signal is inadequate for assessing PA pressure. Left Atrium: Left atrial size was normal in size. Right Atrium: Right atrial size was normal in size. Pericardium:  Trivial pericardial effusion is present. Mitral Valve: The mitral valve is grossly normal. No evidence of mitral valve regurgitation. No evidence of mitral valve stenosis. Tricuspid Valve: The tricuspid valve is

## 2021-05-11 ENCOUNTER — Ambulatory Visit: Payer: 59 | Admitting: Speech Pathology

## 2021-05-11 ENCOUNTER — Ambulatory Visit: Payer: 59 | Attending: Internal Medicine | Admitting: Occupational Therapy

## 2021-05-11 ENCOUNTER — Encounter: Payer: Self-pay | Admitting: Occupational Therapy

## 2021-05-11 DIAGNOSIS — R2681 Unsteadiness on feet: Secondary | ICD-10-CM | POA: Insufficient documentation

## 2021-05-11 DIAGNOSIS — R4701 Aphasia: Secondary | ICD-10-CM | POA: Diagnosis present

## 2021-05-11 DIAGNOSIS — R2689 Other abnormalities of gait and mobility: Secondary | ICD-10-CM | POA: Diagnosis present

## 2021-05-11 DIAGNOSIS — M6281 Muscle weakness (generalized): Secondary | ICD-10-CM | POA: Diagnosis present

## 2021-05-11 NOTE — Therapy (Signed)
?OUTPATIENT SPEECH LANGUAGE PATHOLOGY APHASIA EVALUATION ? ? ?Patient Name: [name] ?MRN: [id number] ?DOB:1960-03-13, 61 y.o., male ?Today's Date: 05/11/2021 ? ?PCP: [name], MD ?REFERRING PROVIDER: Jonetta Osgood, MD  ? ? End of Session - 05/11/21 1451   ? ? Visit Number 1   ? Number of Visits 17   ? Date for SLP Re-Evaluation 07/06/21   ? Authorization Type UHC   ? SLP Start Time 1445   ? SLP Stop Time  1530   ? SLP Time Calculation (min) 45 min   ? Activity Tolerance Patient tolerated treatment well   ? ?  ?  ? ?  ? ? ?Past Medical History:  ?Diagnosis Date  ? Diabetic neuropathy ([place])   ? Diverticulitis   ? History of hepatitis C   ? has been treated in the past  ? History of kidney stones   ? Hyperlipidemia   ? Hypertension   ? 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[place])   ? Other testicular hypofunction   ? Severe sepsis ([place]) 08/26/2019  ? Type II or unspecified type diabetes mellitus without mention of complication, not stated as uncontrolled   ? Vitamin D deficiency   ? ?Past Surgical History:  ?Procedure Laterality Date  ? AMPUTATION Right 09/01/2019  ? Procedure: RIGHT BELOW KNEE AMPUTATION;  Surgeon: Newt Minion, MD;  Location: [place];  Service: Orthopedics;  Laterality: Right;  ? CHOLECYSTECTOMY  1987  ? COLONOSCOPY    ? I & D EXTREMITY Right 08/20/2019  ? Procedure: IRRIGATION & DEBRIDEMENT RIGHT FOOT PARTIAL CALCANEAL EXCISION;  Surgeon: Newt Minion, MD;  Location: [place];  Service: Orthopedics;  Laterality: Right;  ? I & D EXTREMITY Right 08/27/2019  ? Procedure: IRRIGATION AND DEBRIDEMENT  OF FOOT;  Surgeon: Leandrew Koyanagi, MD;  Location: [place];  Service: Orthopedics;  Laterality: Right;  ? ORIF TIBIA FRACTURE    ? x 3 right leg  ? ?Patient Active Problem List  ? Diagnosis Date Noted  ? Cerebral thrombosis with cerebral infarction 05/09/2021  ? Aphasia 05/08/2021  ? Leukocytosis 05/08/2021  ? Right below-knee amputee ([place]) 09/03/2019  ? Hyponatremia 08/26/2019  ? Cutaneous abscess of right  foot   ? Poorly controlled diabetes mellitus ([place]) 07/16/2019  ? FHx: heart disease 10/08/2017  ? Type 2 diabetes mellitus with stage 2 chronic kidney disease, without long-term current use of insulin ([place]) 10/08/2017  ? Insomnia 07/08/2017  ? Smoker 08/17/2015  ? COPD (chronic obstructive pulmonary disease) ([place]) 08/17/2015  ? T2_NIDDM w/Peripheral Neuropathy 04/01/2014  ? CKD stage 2 due to type 2 diabetes mellitus ([place]) 08/31/2013  ? Medication management 05/19/2013  ? Morbid obesity (BMI 35) 05/19/2013  ? Essential hypertension   ? Hyperlipidemia associated with type 2 diabetes mellitus ([place])   ? Vitamin D deficiency   ? History of kidney stones   ? Testosterone deficiency   ? History of hepatitis C   ? ? ?ONSET DATE: 05/08/2021  ? ?REFERRING DIAG: I63.9 (ICD-10-CM) - Acute CVA (cerebrovascular accident) ([place])  ? ?THERAPY DIAG:  ?Aphasia ? ?SUBJECTIVE:  ? ?SUBJECTIVE STATEMENT: ?"I know what I want to say I just can't say it." ?Pt accompanied by: significant other, [name]  ? ?PERTINENT HISTORY: [name] is a 61 y.o. male admitted to hospital 4/18-19/2023 d/t sudden onset of word finding difficulty.  CT head was done which revealed old left MCA territory infarcts.  MRI brain was completed which showed a moderate area of left frontal lobe  acute infarction in addition to the chronic infarcts in the left frontoparietal regions. PMHx singnificant for diabetes, hepatitis C, hypertension, hyperlipidemia.   ? ?PAIN:  ?Are you having pain? No ? ?FALLS: Has patient fallen in last 6 months?  Yes, Number of falls: 1 ? ?LIVING ENVIRONMENT: ?Lives with: lives with their spouse ?Lives in: House/apartment ? ?PLOF:  ?Level of assistance: Independent with IADLs ?Employment: Producer, television/film/video, Actor, customer service  ? ?PATIENT GOALS "just to get better" ? ?OBJECTIVE:  ? ?DIAGNOSTIC FINDINGS: MRI BRAIN WO CONTRAST FINDINGS: ?Brain: Moderate area of acute cortical infarction at the lateral ?left frontal  lobe. Just above the acute infarct is encephalomalacia ?from remote infarct involving a smaller area. Remote low left ?parietal cortex infarct. ? ?COGNITION: ?Overall cognitive status: Within functional limits for tasks assessed ?Areas of impairment: Orientation ?Comments: Denies any overt cognitive deficits at this time, wife endorses. Gives "November" when asked month, is able to self-correct with looking to calendar.  ? ?AUDITORY COMPREHENSION: ?Overall auditory comprehension: Appears intact ?YES/NO questions: Appears intact ?Following directions: Appears intact ?Conversation: Moderately Complex ? ?READING COMPREHENSION: Intact ? ?EXPRESSION: verbal ? ?VERBAL EXPRESSION: ?Level of generative/spontaneous verbalization: sentence and conversation ?Automatic speech: month of year: intact  ?Repetition: Appears intact ?Naming: Confrontation: 76-100%, Convergent: 51-75%, and Divergent: 26-50% ?Pragmatics: Appears intact ?Comments: Overt word finding difficulties intermittently evidenced during conversational speech and during picture description task. Difficulty with sentence construction and providing details when probed.  ?Interfering components:  word finding ?Effective technique: open ended questions, semantic cues, sentence completion, phonemic cues, and written cues ?Non-verbal means of communication: N/A ? ?WRITTEN EXPRESSION: ?Dominant hand: right ? Written expression: Appears intact ? ?MOTOR SPEECH: ?Overall motor speech: Appears intact ?Respiration: thoracic breathing ?Phonation: normal ?Resonance: WFL ?Articulation: Appears intact ?Intelligibility: Intelligible ?Motor planning: Appears intact ? ?STANDARDIZED ASSESSMENTS: ?QAB: Mild ? ? PATIENT REPORTED OUTCOME MEASURES (PROM): ?Communication Participation Item Bank: to be administered next session ? ? ?TODAY'S TREATMENT:  ?Initiated eduction on anomia strategies to be utilize in conversational speech. Education on potential for spontaneous recovery as pt  reports experiencing some mild improvements since d/c from hospital. Education on evaluation results, ST recommendations, POC. Discussion on ST role in treating cognitive communication changes if any should arise as pt returns to ADLs, IADLS, vocational activities.  ? ? ?PATIENT EDUCATION: ?Education details: see above ?Person educated: Patient and Spouse ?Education method: Explanation, Demonstration, and Handouts ?Education comprehension: verbalized understanding, returned demonstration, and needs further education ? ? ?GOALS: ?Goals reviewed with patient? Yes ? ?SHORT TERM GOALS: Target date: 06/08/2021 ? ?Pt will complete communication PROM during first session.  ?Baseline: ?Goal status: INITIAL ? ?2.  Pt will successfully complete language tasks (e.g. SFA, VNeST) with 80% accuracy with rare min-A  ?Baseline:  ?Goal status: INITIAL ? ?3.  Pt will use trained anomia strategies when answering mod-complex questions in 80% of trials with occasional min-A over 2 sessions ?Baseline:  ?Goal status: INITIAL ? ?4.  Pt will name 7+ items in personally relevant categories with rare min A over 2 sessions ?Baseline:  ?Goal status: INITIAL ? ?LONG TERM GOALS: Target date: 07/06/2021 ? ?Pt will report completion of aphasia HEP with mod-I over 1 week period ?Baseline:  ?Goal status: INITIAL ? ?2.  Pt will demonstrate usage of trained anomia strategies in 15+ minute conversation to improve functional communication over 2 sessions ?Baseline:  ?Goal status: INITIAL ? ?3.  Pt will report improved confidence in communication abilities via PROM by d/c ?Baseline:  ?Goal status: INITIAL ? ? ?  ASSESSMENT: ? ?CLINICAL IMPRESSION: ?Patient is a 61 y.o. M who was seen today for aphasia. Pt presents with mild aphasia per QAB administration. Frequent vague speech and word finding difficulties observed in structured tasks and in conversational speech. Reports some improvement since d/c but continues to be concerned about ability to verbally  express himself. Impairment in ability to generate grammatically correct sentences in picture description tasks and in some conversational speech. Has not received training in anomia strategies at this time and is n

## 2021-05-11 NOTE — Therapy (Signed)
?OUTPATIENT OCCUPATIONAL THERAPY NEURO EVALUATION ? ?Patient Name: [name] ?MRN: [id number] ?DOB:08/04/1960, 61 y.o., male ?Today's Date: 05/11/2021 ? ?PCP: [name], MD ?REFERRING PROVIDER: Jonetta Osgood, MD ? ? OT End of Session - 05/11/21 1338   ? ? Visit Number 1   ? Authorization Type UHC   ? OT Start Time 1400   ? OT Stop Time 1430   ? OT Time Calculation (min) 30 min   ? Activity Tolerance Patient tolerated treatment well   ? Behavior During Therapy [facility] for tasks assessed/performed   ? ?  ?  ? ?  ? ? ?Past Medical History:  ?Diagnosis Date  ? Diabetic neuropathy ([place])   ? Diverticulitis   ? History of hepatitis C   ? has been treated in the past  ? History of kidney stones   ? Hyperlipidemia   ? Hypertension   ? 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[place])   ? Other testicular hypofunction   ? Severe sepsis ([place]) 08/26/2019  ? Type II or unspecified type diabetes mellitus without mention of complication, not stated as uncontrolled   ? Vitamin D deficiency   ? ?Past Surgical History:  ?Procedure Laterality Date  ? AMPUTATION Right 09/01/2019  ? Procedure: RIGHT BELOW KNEE AMPUTATION;  Surgeon: Newt Minion, MD;  Location: [place];  Service: Orthopedics;  Laterality: Right;  ? CHOLECYSTECTOMY  1987  ? COLONOSCOPY    ? I & D EXTREMITY Right 08/20/2019  ? Procedure: IRRIGATION & DEBRIDEMENT RIGHT FOOT PARTIAL CALCANEAL EXCISION;  Surgeon: Newt Minion, MD;  Location: [place];  Service: Orthopedics;  Laterality: Right;  ? I & D EXTREMITY Right 08/27/2019  ? Procedure: IRRIGATION AND DEBRIDEMENT  OF FOOT;  Surgeon: Leandrew Koyanagi, MD;  Location: [place];  Service: Orthopedics;  Laterality: Right;  ? ORIF TIBIA FRACTURE    ? x 3 right leg  ? ?Patient Active Problem List  ? Diagnosis Date Noted  ? Cerebral thrombosis with cerebral infarction 05/09/2021  ? Aphasia 05/08/2021  ? Leukocytosis 05/08/2021  ? Right below-knee amputee ([place]) 09/03/2019  ? Hyponatremia 08/26/2019  ? Cutaneous abscess of right foot   ? Poorly  controlled diabetes mellitus ([place]) 07/16/2019  ? FHx: heart disease 10/08/2017  ? Type 2 diabetes mellitus with stage 2 chronic kidney disease, without long-term current use of insulin ([place]) 10/08/2017  ? Insomnia 07/08/2017  ? Smoker 08/17/2015  ? COPD (chronic obstructive pulmonary disease) ([place]) 08/17/2015  ? T2_NIDDM w/Peripheral Neuropathy 04/01/2014  ? CKD stage 2 due to type 2 diabetes mellitus ([place]) 08/31/2013  ? Medication management 05/19/2013  ? Morbid obesity (BMI 35) 05/19/2013  ? Essential hypertension   ? Hyperlipidemia associated with type 2 diabetes mellitus ([place])   ? Vitamin D deficiency   ? History of kidney stones   ? Testosterone deficiency   ? History of hepatitis C   ? ? ?ONSET DATE: 05/09/2021 referral date, 05/08/21 CVA ? ?REFERRING DIAG: I63.9 (ICD-10-CM) - Acute CVA (cerebrovascular accident) ([place])  ? ?THERAPY DIAG:  ?Unsteadiness on feet ? ?Other abnormalities of gait and mobility ? ?Muscle weakness (generalized) ? ?SUBJECTIVE:  ? ?SUBJECTIVE STATEMENT: ?Pt is a 61 y.o. male presenting to Neuro OPOT s/p moderate acute infarct L frontal lob. Past medical history of diabetes, hepatitis C, hypertension, hyperlipidemia, R BKA, COPD. Pt reports his speech is off and it's gotten better "day by day".  ? ?Pt accompanied by: significant other, [name] ? ?PERTINENT HISTORY: Diabetes, Hepatits C, HTN, HLD, R  BKA, COPD ? ?PRECAUTIONS: Fall ? ?WEIGHT BEARING RESTRICTIONS No ? ?PAIN:  ?Are you having pain? No ? ?FALLS: Has patient fallen in last 6 months? Yes. Number of falls 1 x October 2022 ? ?LIVING ENVIRONMENT: ?Lives with: lives with their spouse, dog and a cat ?Lives in: House/apartment ?Stairs:  2 level  with tub/shower combo ?Has following equipment at home: Gilford Rile - 2 wheeled, shower chair, and bed side commode ? 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 ?PATIENT GOALS Pt reports  "speech" ? ?OBJECTIVE: ? ?DIAGNOSTIC FINDINGS: ?1. Moderate area of acute cortical infarction at the left frontal lobe. ?2. Pre-existing left frontal and parietal cortex infarcts. ?3. Slow or absent flow in the left cervical and cavernous ICA with downstream normalization, suggest CTA. ? ?HAND DOMINANCE: Left ? ?ADLs: ?Overall ADLs: Independent with all ADLs and IADLs - back to PLOF ? ? ?IADLs: Pt currently not driving. Pt did not do any cooking or cleaning prior to CVA. ? ?MOBILITY STATUS: Independent ? ?POSTURE COMMENTS:  ?rounded shoulders and forward head ? ? ?FUNCTIONAL OUTCOME MEASURES: ?FOTO: 72% with anticipated outcome of 90%  ? ?UE ROM    ? ?Active ROM Right ?05/11/2021 Left ?05/11/2021  ?Shoulder flexion WFL Old injury - WFL  ?Shoulder abduction    ?Shoulder adduction    ?Shoulder extension    ?Shoulder internal rotation    ?Shoulder external rotation    ?Elbow flexion  WFL  ?Elbow extension    ?Wrist flexion    ?Wrist extension    ?Wrist ulnar deviation    ?Wrist radial deviation    ?Wrist pronation    ?Wrist supination    ?(Blank rows = not tested) ? ? ?UE MMT:    ? ?MMT Right ?05/11/2021 Left ?05/11/2021  ?Shoulder flexion WFL 4/5 d/t previous injury   ?Shoulder abduction    ?Shoulder adduction    ?Shoulder extension    ?Shoulder internal rotation    ?Shoulder external rotation    ?Middle trapezius    ?Lower trapezius    ?Elbow flexion    ?Elbow extension    ?Wrist flexion    ?Wrist extension    ?Wrist ulnar deviation    ?Wrist radial deviation    ?Wrist pronation    ?Wrist supination    ?(Blank rows = not tested) ? ?HAND FUNCTION: ?Grip strength: Right: 59 lbs; Left: 52.4 lbs ? ?COORDINATION: ?Finger Nose Finger test: WFL ?9 Hole Peg test: Right: 36.98 sec; Left: 32.7 sec ? ?SENSATION: ?Light touch: WFL ?Hot/Cold: WFL ?Proprioception: WFL ? ? ? ?COGNITION: ?Overall cognitive status: Impaired ?Attention: Deficits pt with poor attention evidenced by [address] [place] [address] and 88 cancellation test. Pt scored 85%  accuracy. Pt unable to complete [address] [place] [address] test. ?Memory: Deficits  ?Awareness: Deficits  ?Problem solving: Deficits  ? ?VISION: ?Subjective report: pt denies any changes with vision ?Baseline vision: No visual deficits ?Visual history:  none ? ?VISION ASSESSMENT: ?Not tested ? ? ?PERCEPTION: WFL ? ?PRAXIS: WFL ? ? ?TODAY'S TREATMENT:  ?05/11/21 None today - evaluation only.  ? ? ?PATIENT EDUCATION: ?Education details: Education on role and purpose of OT  ?Person educated: Patient ?Education method: Explanation ?Education comprehension: verbalized understanding ? ? ? ?GOALS:  ?Goals reviewed with patient? Yes - no goals d/t evaluation only. ? ? ?ASSESSMENT: ? ?CLINICAL IMPRESSION: ?Patient is a 61 y.o. male who was seen today for occupational therapy evaluation for moderate acute infarct L frontal  lobe. Pt presents with deficits with aphasia, attention and decreased awareness . Skilled occupational therapy is not recommended at this time. ? ?COMORBIDITIES has no other co-morbidities that affects occupational performance. Patient will benefit from skilled OT to address above impairments and improve overall function. ? ?MODIFICATION OR ASSISTANCE TO COMPLETE EVALUATION: No modification of tasks or assist necessary to complete an evaluation. ? ?OT OCCUPATIONAL PROFILE AND HISTORY: Problem focused assessment: Including review of records relating to presenting problem. ? ?CLINICAL DECISION MAKING: LOW - limited treatment options, no task modification necessary ? ? ?EVALUATION COMPLEXITY: Low ? ? ? ?PLAN: ?OT FREQUENCY: one time visit ? ? ? ?Zachery Conch, OT ?05/11/2021, 2:42 PM ? ? ? ? ? ? ? ?  ? ?

## 2021-05-14 ENCOUNTER — Encounter: Payer: Self-pay | Admitting: Nurse Practitioner

## 2021-05-14 ENCOUNTER — Ambulatory Visit: Payer: 59 | Admitting: Nurse Practitioner

## 2021-05-14 VITALS — BP 110/64 | HR 62 | Temp 97.5°F | Wt 261.0 lb

## 2021-05-14 DIAGNOSIS — E1122 Type 2 diabetes mellitus with diabetic chronic kidney disease: Secondary | ICD-10-CM

## 2021-05-14 DIAGNOSIS — D72829 Elevated white blood cell count, unspecified: Secondary | ICD-10-CM

## 2021-05-14 DIAGNOSIS — Z09 Encounter for follow-up examination after completed treatment for conditions other than malignant neoplasm: Secondary | ICD-10-CM

## 2021-05-14 DIAGNOSIS — N182 Chronic kidney disease, stage 2 (mild): Secondary | ICD-10-CM

## 2021-05-14 DIAGNOSIS — I6330 Cerebral infarction due to thrombosis of unspecified cerebral artery: Secondary | ICD-10-CM

## 2021-05-14 DIAGNOSIS — E785 Hyperlipidemia, unspecified: Secondary | ICD-10-CM

## 2021-05-14 DIAGNOSIS — E1169 Type 2 diabetes mellitus with other specified complication: Secondary | ICD-10-CM

## 2021-05-14 DIAGNOSIS — F17200 Nicotine dependence, unspecified, uncomplicated: Secondary | ICD-10-CM

## 2021-05-14 DIAGNOSIS — Z79899 Other long term (current) drug therapy: Secondary | ICD-10-CM

## 2021-05-14 DIAGNOSIS — I10 Essential (primary) hypertension: Secondary | ICD-10-CM

## 2021-05-14 LAB — COMPLETE METABOLIC PANEL WITH GFR
AG Ratio: 1.4 (calc) (ref 1.0–2.5)
ALT: 30 U/L (ref 9–46)
AST: 25 U/L (ref 10–35)
Albumin: 4.1 g/dL (ref 3.6–5.1)
Alkaline phosphatase (APISO): 69 U/L (ref 35–144)
BUN: 25 mg/dL (ref 7–25)
CO2: 27 mmol/L (ref 20–32)
Calcium: 9.6 mg/dL (ref 8.6–10.3)
Chloride: 101 mmol/L (ref 98–110)
Creat: 0.95 mg/dL (ref 0.70–1.35)
Globulin: 2.9 g/dL (calc) (ref 1.9–3.7)
Glucose, Bld: 118 mg/dL — ABNORMAL HIGH (ref 65–99)
Potassium: 4.8 mmol/L (ref 3.5–5.3)
Sodium: 136 mmol/L (ref 135–146)
Total Bilirubin: 0.5 mg/dL (ref 0.2–1.2)
Total Protein: 7 g/dL (ref 6.1–8.1)
eGFR: 90 mL/min/{1.73_m2} (ref >=60)

## 2021-05-14 LAB — CBC WITH DIFFERENTIAL/PLATELET
Absolute Monocytes: 864 cells/uL (ref 200–950)
Basophils Absolute: 77 cells/uL (ref 0–200)
Basophils Relative: 0.8 %
Eosinophils Absolute: 154 cells/uL (ref 15–500)
Eosinophils Relative: 1.6 %
HCT: 43.3 % (ref 38.5–50.0)
Hemoglobin: 15 g/dL (ref 13.2–17.1)
Lymphs Abs: 3734 cells/uL (ref 850–3900)
MCH: 32.6 pg (ref 27.0–33.0)
MCHC: 34.6 g/dL (ref 32.0–36.0)
MCV: 94.1 fL (ref 80.0–100.0)
MPV: 12.7 fL — ABNORMAL HIGH (ref 7.5–12.5)
Monocytes Relative: 9 %
Neutro Abs: 4771 cells/uL (ref 1500–7800)
Neutrophils Relative %: 49.7 %
Platelets: 168 10*3/uL (ref 140–400)
RBC: 4.6 10*6/uL (ref 4.20–5.80)
RDW: 13.1 % (ref 11.0–15.0)
Total Lymphocyte: 38.9 %
WBC: 9.6 10*3/uL (ref 3.8–10.8)

## 2021-05-14 NOTE — Patient Instructions (Signed)
Stroke Prevention ?Some medical conditions and behaviors can lead to a higher chance of having a stroke. You can help prevent a stroke by eating healthy, exercising, not smoking, and managing any medical conditions you have. ?Stroke is a leading cause of functional impairment. Primary prevention is particularly important because a majority of strokes are first-time events. Stroke changes the lives of not only those who experience a stroke but also their family and other caregivers. ?How can this condition affect me? ?A stroke is a medical emergency and should be treated right away. A stroke can lead to brain damage and can sometimes be life-threatening. If a person gets medical treatment right away, there is a better chance of surviving and recovering from a stroke. ?What can increase my risk? ?The following medical conditions may increase your risk of a stroke: ?Cardiovascular disease. ?High blood pressure (hypertension). ?Diabetes. ?High cholesterol. ?Sickle cell disease. ?Blood clotting disorders (hypercoagulable state). ?Obesity. ?Sleep disorders (obstructive sleep apnea). ?Other risk factors include: ?Being older than age 60. ?Having a history of blood clots, stroke, or mini-stroke (transient ischemic attack, TIA). ?Genetic factors, such as race, ethnicity, or a family history of stroke. ?Smoking cigarettes or using other tobacco products. ?Taking birth control pills, especially if you also use tobacco. ?Heavy use of alcohol or drugs, especially cocaine and methamphetamine. ?Physical inactivity. ?What actions can I take to prevent this? ?Manage your health conditions ?High cholesterol levels. ?Eating a healthy diet is important for preventing high cholesterol. If cholesterol cannot be managed through diet alone, you may need to take medicines. ?Take any prescribed medicines to control your cholesterol as told by your health care provider. ?Hypertension. ?To reduce your risk of stroke, try to keep your blood  pressure below 130/80. ?Eating a healthy diet and exercising regularly are important for controlling blood pressure. If these steps are not enough to manage your blood pressure, you may need to take medicines. ?Take any prescribed medicines to control hypertension as told by your health care provider. ?Ask your health care provider if you should monitor your blood pressure at home. ?Have your blood pressure checked every year, even if your blood pressure is normal. Blood pressure increases with age and some medical conditions. ?Diabetes. ?Eating a healthy diet and exercising regularly are important parts of managing your blood sugar (glucose). If your blood sugar cannot be managed through diet and exercise, you may need to take medicines. ?Take any prescribed medicines to control your diabetes as told by your health care provider. ?Get evaluated for obstructive sleep apnea. Talk to your health care provider about getting a sleep evaluation if you snore a lot or have excessive sleepiness. ?Make sure that any other medical conditions you have, such as atrial fibrillation or atherosclerosis, are managed. ?Nutrition ?Follow instructions from your health care provider about what to eat or drink to help manage your health condition. These instructions may include: ?Reducing your daily calorie intake. ?Limiting how much salt (sodium) you use to 1,500 milligrams (mg) each day. ?Using only healthy fats for cooking, such as olive oil, canola oil, or sunflower oil. ?Eating healthy foods. You can do this by: ?Choosing foods that are high in fiber, such as whole grains, and fresh fruits and vegetables. ?Eating at least 5 servings of fruits and vegetables a day. Try to fill one-half of your plate with fruits and vegetables at each meal. ?Choosing lean protein foods, such as lean cuts of meat, poultry without skin, fish, tofu, beans, and nuts. ?Eating low-fat dairy products. ?Avoiding   foods that are high in sodium. This can help  lower blood pressure. ?Avoiding foods that have saturated fat, trans fat, and cholesterol. This can help prevent high cholesterol. ?Avoiding processed and prepared foods. ?Counting your daily carbohydrate intake. ? ?Lifestyle ?If you drink alcohol: ?Limit how much you have to: ?0-1 drink a day for women who are not pregnant. ?0-2 drinks a day for men. ?Know how much alcohol is in your drink. In the [place] one drink equals one 12 oz bottle of beer ( ), one 5 oz glass of wine ( ), or one 1? oz glass of hard liquor (17mL). ?Do not use any products that contain nicotine or tobacco. These products include cigarettes, chewing tobacco, and vaping devices, such as e-cigarettes. If you need help quitting, ask your health care provider. ?Avoid secondhand smoke. ?Do not use drugs. ?Activity ? ?Try to stay at a healthy weight. ?Get at least 30 minutes of exercise on most days, such as: ?Fast walking. ?Biking. ?Swimming. ?Medicines ?Take over-the-counter and prescription medicines only as told by your health care provider. Aspirin or blood thinners (antiplatelets or anticoagulants) may be recommended to reduce your risk of forming blood clots that can lead to stroke. ?Avoid taking birth control pills. Talk to your health care provider about the risks of taking birth control pills if: ?You are over 76 years old. ?You smoke. ?You get very bad headaches. ?You have had a blood clot. ?Where to find more information ?American Stroke Association: www.strokeassociation.org ?Get help right away if: ?You or a loved one has any symptoms of a stroke. "BE FAST" is an easy way to remember the main warning signs of a stroke: ?B - Balance. Signs are dizziness, sudden trouble walking, or loss of balance. ?E - Eyes. Signs are trouble seeing or a sudden change in vision. ?F - Face. Signs are sudden weakness or numbness of the face, or the face or eyelid drooping on one side. ?A - Arms. Signs are weakness or numbness in an arm. This happens  suddenly and usually on one side of the body. ?S - Speech. Signs are sudden trouble speaking, slurred speech, or trouble understanding what people say. ?T - Time. Time to call emergency services. Write down what time symptoms started. ?You or a loved one has other signs of a stroke, such as: ?A sudden, severe headache with no known cause. ?Nausea or vomiting. ?Seizure. ?These symptoms may represent a serious problem that is an emergency. Do not wait to see if the symptoms will go away. Get medical help right away. Call your local emergency services (911 in the [place] Do not drive yourself to the hospital. ?Summary ?You can help to prevent a stroke by eating healthy, exercising, not smoking, limiting alcohol intake, and managing any medical conditions you may have. ?Do not use any products that contain nicotine or tobacco. These include cigarettes, chewing tobacco, and vaping devices, such as e-cigarettes. If you need help quitting, ask your health care provider. ?Remember "BE FAST" for warning signs of a stroke. Get help right away if you or a loved one has any of these signs. ?This information is not intended to replace advice given to you by your health care provider. Make sure you discuss any questions you have with your health care provider. ?Document Revised: 08/09/2019 Document Reviewed: 08/09/2019 ?Elsevier Patient Education ? 2023 [employer] ?Tobacco Use Disorder ?Tobacco use disorder (TUD) occurs when a person craves, seeks, and uses tobacco, regardless of the consequences. This disorder can cause problems with  mental and physical health. It can affect your ability to have healthy relationships. It can also keep you from meeting your responsibilities at work, home, or school. ?Tobacco products contain a dangerous chemical called nicotine. Nicotine triggers hormones that make the body feel stimulated and works on areas of the brain that make a person feel good. These effects can make the person depend on  nicotine, which makes it hard to quit tobacco. ?Tobacco may be: ?Smoked as a cigarette or cigar. ?Inhaled using vaping devices, such as e-cigarettes. ?Smoked in a pipe or hookah. ?Chewed as smokeless toba

## 2021-05-14 NOTE — Progress Notes (Signed)
Hospital follow up ? ?Assessment and Plan: ?Hospital visit follow up for:  ? ?1. Hospital discharge follow-up ? ? ?2. Cerebral thrombosis with cerebral infarction ?Continue OCEANIC-STROKE research study Asundexian or placebo. ?Continue to follow with [facility]. ?Continue outpatient speech therapy. ? ?3. Leukocytosis, unspecified type ? ?-CBC ? ? ?4. Type 2 diabetes mellitus with stage 2 chronic kidney disease, without long-term current use of insulin ([place]) ?Continue Trulicity [id number] mcg ?Continue Metformin 1000 BID ? ?5. Medication management ? - Asundexian ? ?6. Hyperlipidemia associated with type 2 diabetes mellitus ([place]) ?Continue Rosuvastatin  ? ?7. Essential hypertension ?Well controlled. ?Continue HCTZ/Lisinopril. ? ?8. Smoker ?Praised on x1 week cessation. ?Offered Nicotine patches, patient declines.  ?Reach out to office if you require further assistance. ? ?9. Morbid obesity (BMI 35) ?Discussed importance of carb/cardiac diet, DASH Diet, incorporate more fruits and vegetable, less fats, trans fats. ? ? ? ? ?All medications were reviewed with patient and family and fully reconciled. All questions answered fully, and patient and family members were encouraged to call the office with any further questions or concerns. Discussed goal to avoid readmission related to this diagnosis.  ? ?Over 40 minutes of exam, counseling, chart review, and complex, high/moderate level critical decision making was performed this visit.  ? ?Future Appointments  ?Date Time Provider [place]  ?05/16/2021  2:45 PM Leta Speller I, CCC-SLP [facility]  ?05/18/2021  3:30 PM Su Monks, CCC-SLP [facility]  ?05/23/2021  2:45 PM Su Monks, CCC-SLP [facility]  ?05/25/2021  2:45 PM Su Monks, CCC-SLP [facility]  ?05/29/2021  3:30 PM Su Monks, CCC-SLP [facility]  ?06/01/2021  2:45 PM Su Monks, CCC-SLP [facility]  ?06/06/2021  2:45 PM Su Monks, CCC-SLP [facility]  [facility]  ?06/08/2021  2:45 PM Marzetta Board, CCC-SLP [facility]  ?06/11/2021  8:30 AM Debbora Presto, NP [facility] None  ?06/13/2021  2:45 PM Su Monks, CCC-SLP [facility]  ?06/15/2021  2:45 PM Su Monks, CCC-SLP [facility]  ?06/20/2021  2:45 PM Su Monks, CCC-SLP [facility]  ?06/22/2021  3:30 PM Marzetta Board, [place]  ?06/27/2021  2:45 PM Su Monks, CCC-SLP [facility]  ?06/29/2021  2:45 PM Marzetta Board, [place]  ?07/19/2021  3:00 PM [name], MD [facility] None  ? ? ? ?HPI ?61 y.o.male presents for follow up for transition from recent hospitalization or SNIF stay. Admit date to the hospital was 05/07/21, patient was discharged from the hospital on 05/09/21 and our clinical staff contacted the office the day after discharge to set up a follow up appointment. The discharge summary, medications, and diagnostic test results were reviewed before meeting with the patient. The patient was admitted for:  ? ?Aphasia, CVA.  CT head negative for acute stroke showing areas of old infarct and encephalomalcia involving theleft frontal and posterior parietal lobes. 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  He was started on plavix for 3 mo. Neurology was consulted. He is currently participating in a stroke trial. He is scheduled to follow up outpatient.  He is working with speech therapy.   ? ?He is no longer smoking cigarettes. ? ?Home health is not involved.  ? ?Images while in the hospital: ?[place] ? ?Result Date: 05/07/2021 ?CLINICAL DATA:  Neurologic deficit. EXAM: CT HEAD WITHOUT CONTRAST TECHNIQUE: Contiguous axial images were obtained from the base of the skull through the vertex without  intravenous contrast. RADIATION DOSE REDUCTION: This exam was performed according to the departmental dose-optimization program which includes automated exposure control, adjustment of the mA and/or kV  according to patient size and/or use of iterative reconstruction technique. COMPARISON:  Head CT dated 04/05/2013. FINDINGS: Brain: Areas of old infarct and encephalomalacia involving the left frontal and posterior parietal lobes. The ventricles and sulci appropriate size for patient's age. There is a cavum septum pellucidum and cavum vergae. No acute intracranial hemorrhage. No mass effect or midline shift. No extra-axial fluid collection. Vascular: No hyperdense vessel or unexpected calcification. Skull: Normal. Negative for fracture or focal lesion. Sinuses/Orbits: No acute finding. Other: None IMPRESSION: 1. No acute intracranial pathology. 2. Areas of old infarct and encephalomalacia involving the left frontal and posterior parietal lobes. Electronically Signed   By: Elgie Collard M.D.   On: 05/07/2021 23:25  ? ?MR BRAIN WO CONTRAST ? ?Result Date: 05/08/2021 ?CLINICAL DATA:  TIA.  Sudden on set aphasia. EXAM: MRI HEAD WITHOUT CONTRAST TECHNIQUE: Multiplanar, multiecho pulse sequences of the brain and surrounding structures were obtained without intravenous contrast. COMPARISON:  Head CT from earlier today FINDINGS: Brain: Moderate area of acute cortical infarction at the lateral left frontal lobe. Just above the acute infarct is encephalomalacia from remote infarct involving a smaller area. Remote low left parietal cortex infarct. No acute hemorrhage, hydrocephalus, or collection. Generalized cerebral volume loss. Hemosiderin staining at sites of remote infarction. Vascular: No flow seen in the right cervical and cavernous carotid artery with normalized downstream flow void. Skull and upper cervical spine: Negative Sinuses/Orbits: No acute finding IMPRESSION: 1. Moderate area of acute cortical infarction at the left frontal lobe. 2. Pre-existing left frontal and parietal cortex infarcts. 3. Slow or absent flow in the left cervical and cavernous ICA with downstream normalization, suggest CTA. Electronically  Signed   By: Tiburcio Pea M.D.   On: 05/08/2021 07:06  ? ?ECHOCARDIOGRAM COMPLETE ? ?Result Date: 05/08/2021 ?   ECHOCARDIOGRAM REPORT   Patient Name:   [name] Date of Exam: 05/08/2021 Medical Rec #:  [id number]      Height:       74.0 in Accession #:    [id number]     Weight:       267.0 lb Date of Birth:  Mar 28, 1960     BSA:          2.457 m? Patient Age:    60 years       BP:           142/68 mmHg Patient Gender: M              HR:           67 bpm. Exam Location:  Inpatient Procedure: 2D Echo, Cardiac Doppler, Color Doppler and Intracardiac            Opacification Agent Indications:    CVA  History:        Patient has no prior history of Echocardiogram examinations.                 Risk Factors:Hypertension, Diabetes and Dyslipidemia.  Sonographer:    Neomia Dear RDCS Referring Phys: [id number] John Giovanni  Sonographer Comments: Technically difficult study due to poor echo windows. IMPRESSIONS  1. Left ventricular ejection fraction, by estimation, is 60 to 65%. The left ventricle has normal function. The left ventricle has no regional wall motion abnormalities. There is mild asymmetric left ventricular hypertrophy of the basal-septal segment. Left ventricular diastolic  parameters are consistent with Grade I diastolic dysfunction (impaired relaxation).  2. Right ventricular systolic function is normal. The right ventricular size is normal. Tricuspid regurgitation signal is inadequate for assessing PA pressure.  3. The mitral valve is grossly normal. No evidence of mitral valve regurgitation. No evidence of mitral stenosis.  4. The aortic valve is tricuspid. There is mild calcification of the aortic valve. There is mild thickening of the aortic valve. Aortic valve regurgitation is not visualized. Aortic valve sclerosis is present, with no evidence of aortic valve stenosis.  5. The inferior vena cava is normal in size with greater than 50% respiratory variability, suggesting right atrial pressure of 3  mmHg. Conclusion(s)/Recommendation(s): No intracardiac source of embolism detected on this transthoracic study. Consider a transesophageal echocardiogram to exclude cardiac source of embolism if clinica

## 2021-05-16 ENCOUNTER — Ambulatory Visit: Payer: 59

## 2021-05-16 DIAGNOSIS — R2681 Unsteadiness on feet: Secondary | ICD-10-CM | POA: Diagnosis not present

## 2021-05-16 DIAGNOSIS — R4701 Aphasia: Secondary | ICD-10-CM

## 2021-05-16 NOTE — Patient Instructions (Addendum)
Practice describing these work related terms:  ?Blocks ?Crankshafts  ?Connecting Rods ?Pistons  ?Cylinder Heads  ?Valves ?Springs ?Retainers ?Gaskets  ?Locks  ? ? ?

## 2021-05-16 NOTE — Therapy (Signed)
?OUTPATIENT SPEECH LANGUAGE PATHOLOGY TREATMENT NOTE ? ? ?Patient Name: [name] ?MRN: [id number] ?DOB:1960-05-13, 61 y.o., male ?Today's Date: 05/16/2021 ? ?PCP: [name], MD ?REFERRING PROVIDER: [name], MD ? ?END OF SESSION:  ? End of Session - 05/16/21 1440   ? ? Visit Number 2   ? Number of Visits 17   ? Date for SLP Re-Evaluation 07/06/21   ? Authorization Type UHC   ? SLP Start Time 1445   ? SLP Stop Time  1530   ? SLP Time Calculation (min) 45 min   ? Activity Tolerance Patient tolerated treatment well   ? ?  ?  ? ?  ? ? ?Past Medical History:  ?Diagnosis Date  ? Diabetic neuropathy ([place])   ? Diverticulitis   ? History of hepatitis C   ? has been treated in the past  ? History of kidney stones   ? Hyperlipidemia   ? Hypertension   ? 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[place])   ? Other testicular hypofunction   ? Severe sepsis ([place]) 08/26/2019  ? Type II or unspecified type diabetes mellitus without mention of complication, not stated as uncontrolled   ? Vitamin D deficiency   ? ?Past Surgical History:  ?Procedure Laterality Date  ? AMPUTATION Right 09/01/2019  ? Procedure: RIGHT BELOW KNEE AMPUTATION;  Surgeon: Newt Minion, MD;  Location: [facility];  Service: Orthopedics;  Laterality: Right;  ? CHOLECYSTECTOMY  1987  ? COLONOSCOPY    ? I & D EXTREMITY Right 08/20/2019  ? Procedure: IRRIGATION & DEBRIDEMENT RIGHT FOOT PARTIAL CALCANEAL EXCISION;  Surgeon: Newt Minion, MD;  Location: [facility];  Service: Orthopedics;  Laterality: Right;  ? I & D EXTREMITY Right 08/27/2019  ? Procedure: IRRIGATION AND DEBRIDEMENT  OF FOOT;  Surgeon: Leandrew Koyanagi, MD;  Location: [facility];  Service: Orthopedics;  Laterality: Right;  ? ORIF TIBIA FRACTURE    ? x 3 right leg  ? ?Patient Active Problem List  ? Diagnosis Date Noted  ? Cerebral thrombosis with cerebral infarction 05/09/2021  ? Aphasia 05/08/2021  ? Leukocytosis 05/08/2021  ? Right below-knee amputee ([place]) 09/03/2019  ? Hyponatremia 08/26/2019  ? Cutaneous abscess  of right foot   ? Poorly controlled diabetes mellitus ([place]) 07/16/2019  ? FHx: heart disease 10/08/2017  ? Type 2 diabetes mellitus with stage 2 chronic kidney disease, without long-term current use of insulin ([place]) 10/08/2017  ? Insomnia 07/08/2017  ? Smoker 08/17/2015  ? COPD (chronic obstructive pulmonary disease) ([place]) 08/17/2015  ? T2_NIDDM w/Peripheral Neuropathy 04/01/2014  ? CKD stage 2 due to type 2 diabetes mellitus ([place]) 08/31/2013  ? Medication management 05/19/2013  ? Morbid obesity (BMI 35) 05/19/2013  ? Essential hypertension   ? Hyperlipidemia associated with type 2 diabetes mellitus ([place])   ? Vitamin D deficiency   ? History of kidney stones   ? Testosterone deficiency   ? History of hepatitis C   ? ? ?ONSET DATE: 05/08/2021 ? ?REFERRING DIAG: I63.9 (ICD-10-CM) - Acute CVA (cerebrovascular accident) ([place])  ? ?PERTINENT HISTORY: [name] is a 61 y.o. male admitted to hospital 4/18-19/2023 d/t sudden onset of word finding difficulty.  CT head was done which revealed old left MCA territory infarcts.  MRI brain was completed which showed a moderate area of left frontal lobe acute infarction in addition to the chronic infarcts in the left frontoparietal regions. PMHx singnificant for diabetes, hepatitis C, hypertension, hyperlipidemia.  ? ?THERAPY DIAG:  ?Aphasia ? ?SUBJECTIVE: "  a little bit" re: anomia  ? ?PAIN:  ?Are you having pain? No ? ?OBJECTIVE:  ? ?TODAY'S TREATMENT:  ?05-16-21: Education with handout provided to patient and wife re: anomia compensations and cueing hierarchy to aid word finding as pt is not currently compensating independently. Some frustration and avoidance of talking reported as pt is usually very talkative at baseline. Communication Participation Item Bank (CPIB) completed with score of 16 (some discrepancy reported by wife indicating reduced patient awareness of severity of functional impact). Reading aloud of PROM resulted in occasional omissions/substitutions for  functor words with limited awareness. Increased difficulty spelling indicated at word level, which is new change. 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 Pt able to usually self-correct errors with occasional cues to recognize misspellings. SLP targeted divergent naming for personally relevant categories with occasional moderate prompting required to ID additional items. In cued description items, pt demonstrated awareness of decreased verbal expression (more vague, longer processing time).  ? ?05-11-21: Initiated eduction on anomia strategies to be utilize in conversational speech. Education on potential for spontaneous recovery as pt reports experiencing some mild improvements since d/c from hospital. Education on evaluation results, ST recommendations, POC. Discussion on ST role in treating cognitive communication changes if any should arise as pt returns to ADLs, IADLS, vocational activities.  ?  ?  ?PATIENT EDUCATION: ?Education details: see above ?Person educated: Patient and Spouse ?Education method: Explanation, Demonstration, and Handouts ?Education comprehension: verbalized understanding, returned demonstration, and needs further education ?  ?  ?GOALS: ?Goals reviewed with patient? Yes ?  ?SHORT TERM GOALS: Target date: 06/08/2021 ?  ?Pt will complete communication PROM during first session.  ?Baseline: CPIB=16 ?Goal status: MET  ?  ?2.  Pt will successfully complete language tasks (e.g. SFA, VNeST) with 80% accuracy with rare min-A  ?Baseline:  ?Goal status: ongoing ?  ?3.  Pt will use trained anomia strategies when answering mod-complex questions in 80% of trials with occasional min-A over 2 sessions ?Baseline:  ?Goal status: ongoing ?  ?4.  Pt will name 7+ items in personally relevant categories with rare min A over 2 sessions ?Baseline: 05-16-21 ?Goal status: ongoing ?  ?LONG TERM GOALS: Target date: 07/06/2021 ?  ?Pt will report completion of aphasia HEP  with mod-I over 1 week period ?Baseline:  ?Goal status: ongoing ?  ?2.  Pt will demonstrate usage of trained anomia strategies in 15+ minute conversation to improve functional communication over 2 sessions ?Baseline:  ?Goal status: ongoing ?  ?3.  Pt will report improved confidence in communication abilities via PROM by d/c ?Baseline:  ?Goal status: ongoing ?  ?  ?ASSESSMENT: ?  ?CLINICAL IMPRESSION: ?Patient is a 61 y.o. M who was seen today for aphasia. Pt presents with mild aphasia per QAB administration. Frequent vague speech and word finding difficulties observed in structured tasks and in conversational speech. Reports some improvement since d/c but continues to be concerned about ability to verbally express himself. Impairment in ability to generate grammatically correct sentences in picture description tasks and in some conversational speech. Frequently has to refer to wife to fill in details when providing history and concerns. Additional c/o of saying things "I don't want to come out." Wife endorses some inappropriate speech but both tell ST pt is aware when this happens. Initiated education and training of anomia strategies to aid word finding on structured tasks and in conversation. I recommend skilled ST to address expressive aphasia.  ?  ?  OBJECTIVE IMPAIRMENTS include aphasia. These impairments are limiting patient from effectively communicating at home and in community. ?No overt factors affecting potential to achieve goals and functional outcomes. Patient will benefit from skilled SLP services to address above impairments and improve overall function. ?  ?REHAB POTENTIAL: Excellent ?  ?PLAN: ?SLP FREQUENCY: 2x/week ?  ?SLP DURATION: 8 weeks ?  ?PLANNED INTERVENTIONS: Language facilitation, Cueing hierachy, Functional tasks, SLP instruction and feedback, Compensatory strategies, and Patient/family education ? ? ?Marzetta Board, CCC-SLP ?05/16/2021, 2:40 PM ? ? ?

## 2021-05-18 ENCOUNTER — Ambulatory Visit: Payer: 59 | Admitting: Speech Pathology

## 2021-05-18 DIAGNOSIS — R4701 Aphasia: Secondary | ICD-10-CM

## 2021-05-18 DIAGNOSIS — R2681 Unsteadiness on feet: Secondary | ICD-10-CM | POA: Diagnosis not present

## 2021-05-18 NOTE — Therapy (Signed)
?OUTPATIENT SPEECH LANGUAGE PATHOLOGY TREATMENT NOTE ? ? ?Patient Name: [name] ?MRN: [id number] ?DOB:[birth year], 61 y.o., male ?Today's Date: 05/18/2021 ? ?PCP: [name], MD ?REFERRING PROVIDER: [name], MD ? ?END OF SESSION:  ? End of Session - 05/18/21 1449   ? ? Visit Number 3   ? Number of Visits 17   ? Date for SLP Re-Evaluation 07/06/21   ? Authorization Type UHC   ? SLP Start Time 4098   ? SLP Stop Time  1530   ? SLP Time Calculation (min) 41 min   ? Activity Tolerance Patient tolerated treatment well   ? ?  ?  ? ?  ? ? ? ?Past Medical History:  ?Diagnosis Date  ? Diabetic neuropathy ([place])   ? Diverticulitis   ? History of hepatitis C   ? has been treated in the past  ? History of kidney stones   ? Hyperlipidemia   ? Hypertension   ? 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[place])   ? Other testicular hypofunction   ? Severe sepsis ([place]) 08/26/2019  ? Type II or unspecified type diabetes mellitus without mention of complication, not stated as uncontrolled   ? Vitamin D deficiency   ? ?Past Surgical History:  ?Procedure Laterality Date  ? AMPUTATION Right 09/01/2019  ? Procedure: RIGHT BELOW KNEE AMPUTATION;  Surgeon: Newt Minion, MD;  Location: [facility];  Service: Orthopedics;  Laterality: Right;  ? CHOLECYSTECTOMY  1987  ? COLONOSCOPY    ? I & D EXTREMITY Right 08/20/2019  ? Procedure: IRRIGATION & DEBRIDEMENT RIGHT FOOT PARTIAL CALCANEAL EXCISION;  Surgeon: Newt Minion, MD;  Location: [facility];  Service: Orthopedics;  Laterality: Right;  ? I & D EXTREMITY Right 08/27/2019  ? Procedure: IRRIGATION AND DEBRIDEMENT  OF FOOT;  Surgeon: Leandrew Koyanagi, MD;  Location: [facility];  Service: Orthopedics;  Laterality: Right;  ? ORIF TIBIA FRACTURE    ? x 3 right leg  ? ?Patient Active Problem List  ? Diagnosis Date Noted  ? Cerebral thrombosis with cerebral infarction 05/09/2021  ? Aphasia 05/08/2021  ? Leukocytosis 05/08/2021  ? Right below-knee amputee ([place]) 09/03/2019  ? Hyponatremia 08/26/2019  ? Cutaneous abscess  of right foot   ? Poorly controlled diabetes mellitus ([place]) 07/16/2019  ? FHx: heart disease 10/08/2017  ? Type 2 diabetes mellitus with stage 2 chronic kidney disease, without long-term current use of insulin ([place]) 10/08/2017  ? Insomnia 07/08/2017  ? Smoker 08/17/2015  ? COPD (chronic obstructive pulmonary disease) ([place]) 08/17/2015  ? T2_NIDDM w/Peripheral Neuropathy 04/01/2014  ? CKD stage 2 due to type 2 diabetes mellitus ([place]) 08/31/2013  ? Medication management 05/19/2013  ? Morbid obesity (BMI 35) 05/19/2013  ? Essential hypertension   ? Hyperlipidemia associated with type 2 diabetes mellitus ([place])   ? Vitamin D deficiency   ? History of kidney stones   ? Testosterone deficiency   ? History of hepatitis C   ? ? ?ONSET DATE: 05/08/2021 ? ?REFERRING DIAG: I63.9 (ICD-10-CM) - Acute CVA (cerebrovascular accident) ([place])  ? ?PERTINENT HISTORY: [name] is a 61 y.o. male admitted to hospital 4/18-19/2023 d/t sudden onset of word finding difficulty.  CT head was done which revealed old left MCA territory infarcts.  MRI brain was completed which showed a moderate area of left frontal lobe acute infarction in addition to the chronic infarcts in the left frontoparietal regions. PMHx singnificant for diabetes, hepatitis C, hypertension, hyperlipidemia.  ? ?THERAPY DIAG:  ?Aphasia ? ?  SUBJECTIVE: "doing ok"   ? ?PAIN:  ?Are you having pain? No ? ?OBJECTIVE:  ? ?TODAY'S TREATMENT:  ?05-18-21: Target organization, descriptions, sequencing in SFA task in which pt asked to describe work of employees at his workplace. Usual vague language exhibited, able to provide details with usual mod-A. Difficulty ID actions of target vocation and sequencing tasks they need to do to complete their role. Provide education on cognitive fatigue as pt reporting "brain stops working" re: homework completion. Addressed time of day, cognitive reserve preservation through meta-cognition strategies. Encouraged pt to re-visit tasks on which he  struggled with fresh persepctive and see if he is able to generate additional items. Updates HEP to include verb SFA task.  ? ?05-16-21: Education with handout provided to patient and wife re: anomia compensations and cueing hierarchy to aid word finding as pt is not currently compensating independently. Some frustration and avoidance of talking reported as pt is usually very talkative at baseline. Communication Participation Item Bank (CPIB) completed with score of 16 (some discrepancy reported by wife indicating reduced patient awareness of severity of functional impact). Reading aloud of PROM resulted in occasional omissions/substitutions for functor words with limited awareness. Increased difficulty spelling indicated at word level, which is new change. 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 Pt able to usually self-correct errors with occasional cues to recognize misspellings. SLP targeted divergent naming for personally relevant categories with occasional moderate prompting required to ID additional items. In cued description items, pt demonstrated awareness of decreased verbal expression (more vague, longer processing time).  ? ?05-11-21: Initiated eduction on anomia strategies to be utilize in conversational speech. Education on potential for spontaneous recovery as pt reports experiencing some mild improvements since d/c from hospital. Education on evaluation results, ST recommendations, POC. Discussion on ST role in treating cognitive communication changes if any should arise as pt returns to ADLs, IADLS, vocational activities.  ?  ?  ?PATIENT EDUCATION: ?Education details: see above ?Person educated: Patient and Spouse ?Education method: Explanation, Demonstration, and Handouts ?Education comprehension: verbalized understanding, returned demonstration, and needs further education ? ?HOME EXERCISE PROGRAM: ?SFA for job  ?  ?GOALS: ?Goals reviewed with patient?  Yes ?  ?SHORT TERM GOALS: Target date: 06/08/2021 ?  ?Pt will complete communication PROM during first session.  ?Baseline: CPIB=16 ?Goal status: MET  ?  ?2.  Pt will successfully complete language tasks (e.g. SFA, VNeST) with 80% accuracy with rare min-A  ?Baseline:  ?Goal status: ongoing ?  ?3.  Pt will use trained anomia strategies when answering mod-complex questions in 80% of trials with occasional min-A over 2 sessions ?Baseline:  ?Goal status: ongoing ?  ?4.  Pt will name 7+ items in personally relevant categories with rare min A over 2 sessions ?Baseline: 05-16-21 ?Goal status: ongoing ?  ?LONG TERM GOALS: Target date: 07/06/2021 ?  ?Pt will report completion of aphasia HEP with mod-I over 1 week period ?Baseline:  ?Goal status: ongoing ?  ?2.  Pt will demonstrate usage of trained anomia strategies in 15+ minute conversation to improve functional communication over 2 sessions ?Baseline:  ?Goal status: ongoing ?  ?3.  Pt will report improved confidence in communication abilities via PROM by d/c ?Baseline:  ?Goal status: ongoing ?  ?  ?ASSESSMENT: ?  ?CLINICAL IMPRESSION: ?Patient is a 61 y.o. M who was seen today for aphasia. Pt presents with mild aphasia per QAB administration. Frequent vague speech and word finding difficulties observed in  structured tasks and in conversational speech. Reports some improvement since d/c but continues to be concerned about ability to verbally express himself. Impairment in ability to generate grammatically correct sentences in picture description tasks and in some conversational speech. Frequently has to refer to wife to fill in details when providing history and concerns. Additional c/o of saying things "I don't want to come out." Wife endorses some inappropriate speech but both tell ST pt is aware when this happens. Initiated education and training of anomia strategies to aid word finding on structured tasks and in conversation. I recommend skilled ST to address expressive  aphasia.  ?  ?OBJECTIVE IMPAIRMENTS include aphasia. These impairments are limiting patient from effectively communicating at home and in community. ?No overt factors affecting potential to achieve goals

## 2021-05-20 ENCOUNTER — Other Ambulatory Visit: Payer: Self-pay | Admitting: Internal Medicine

## 2021-05-20 DIAGNOSIS — I493 Ventricular premature depolarization: Secondary | ICD-10-CM

## 2021-05-21 ENCOUNTER — Other Ambulatory Visit: Payer: Self-pay | Admitting: *Deleted

## 2021-05-21 NOTE — Patient Outreach (Signed)
[facility]) Care Management ? ?05/21/2021 ? ?Gerri Lins ?November 09, 1960 ?[id number] ? ? ?RED ON EMMI ALERT - Stroke ?Day # 9 ?Date: 4/30 ?Red Alert Reason: Lost interest in things used to enjoy? YES ? ? ?Outreach attempt #1, unsuccessful, HIPAA compliant voice message left on both listed home and mobile numbers.   ? ? ?Plan: ?RN CM will send outreach letter and follow up within the next 3-4 business days. ? ?Valente David, RN, MSN, CCM ?[facility] Care Management  ?Community Care Manager ?[phone] ? ? ?

## 2021-05-21 NOTE — Patient Outreach (Signed)
Received a red flag Emmi stroke notification for [name]. ?I have assigned Joellyn Quails, RN to call for follow up and determine if there are any Case Management needs.  ?  ?Arville Care, CBCS, CMAA ?[place] Management Assistant ?[facility] Management ?[phone]   ?

## 2021-05-23 ENCOUNTER — Ambulatory Visit: Payer: 59 | Attending: Internal Medicine | Admitting: Speech Pathology

## 2021-05-23 DIAGNOSIS — R4701 Aphasia: Secondary | ICD-10-CM | POA: Diagnosis present

## 2021-05-23 NOTE — Therapy (Signed)
?OUTPATIENT SPEECH LANGUAGE PATHOLOGY TREATMENT NOTE ? ? ?Patient Name: [name] ?MRN: [id number] ?DOB:Jun 25, 1960, 61 y.o., male ?Today's Date: 05/23/2021 ? ?PCP: [name], MD ?REFERRING PROVIDER: [name], MD ? ?END OF SESSION:  ? End of Session - 05/23/21 1441   ? ? Visit Number 4   ? Number of Visits 17   ? Date for SLP Re-Evaluation 07/06/21   ? Authorization Type UHC   ? SLP Start Time 1441   ? SLP Stop Time  1526   ? SLP Time Calculation (min) 45 min   ? Activity Tolerance Patient tolerated treatment well   ? ?  ?  ? ?  ? ? ? ? ?Past Medical History:  ?Diagnosis Date  ? Diabetic neuropathy ([place])   ? Diverticulitis   ? History of hepatitis C   ? has been treated in the past  ? History of kidney stones   ? Hyperlipidemia   ? Hypertension   ? 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[place])   ? Other testicular hypofunction   ? Severe sepsis ([place]) 08/26/2019  ? Type II or unspecified type diabetes mellitus without mention of complication, not stated as uncontrolled   ? Vitamin D deficiency   ? ?Past Surgical History:  ?Procedure Laterality Date  ? AMPUTATION Right 09/01/2019  ? Procedure: RIGHT BELOW KNEE AMPUTATION;  Surgeon: Newt Minion, MD;  Location: [facility];  Service: Orthopedics;  Laterality: Right;  ? CHOLECYSTECTOMY  1987  ? COLONOSCOPY    ? I & D EXTREMITY Right 08/20/2019  ? Procedure: IRRIGATION & DEBRIDEMENT RIGHT FOOT PARTIAL CALCANEAL EXCISION;  Surgeon: Newt Minion, MD;  Location: [facility];  Service: Orthopedics;  Laterality: Right;  ? I & D EXTREMITY Right 08/27/2019  ? Procedure: IRRIGATION AND DEBRIDEMENT  OF FOOT;  Surgeon: Leandrew Koyanagi, MD;  Location: [facility];  Service: Orthopedics;  Laterality: Right;  ? ORIF TIBIA FRACTURE    ? x 3 right leg  ? ?Patient Active Problem List  ? Diagnosis Date Noted  ? Cerebral thrombosis with cerebral infarction 05/09/2021  ? Aphasia 05/08/2021  ? Leukocytosis 05/08/2021  ? Right below-knee amputee ([place]) 09/03/2019  ? Hyponatremia 08/26/2019  ? Cutaneous  abscess of right foot   ? Poorly controlled diabetes mellitus ([place]) 07/16/2019  ? FHx: heart disease 10/08/2017  ? Type 2 diabetes mellitus with stage 2 chronic kidney disease, without long-term current use of insulin ([place]) 10/08/2017  ? Insomnia 07/08/2017  ? Smoker 08/17/2015  ? COPD (chronic obstructive pulmonary disease) ([place]) 08/17/2015  ? T2_NIDDM w/Peripheral Neuropathy 04/01/2014  ? CKD stage 2 due to type 2 diabetes mellitus ([place]) 08/31/2013  ? Medication management 05/19/2013  ? Morbid obesity (BMI 35) 05/19/2013  ? Essential hypertension   ? Hyperlipidemia associated with type 2 diabetes mellitus ([place])   ? Vitamin D deficiency   ? History of kidney stones   ? Testosterone deficiency   ? History of hepatitis C   ? ? ?ONSET DATE: 05/08/2021 ? ?REFERRING DIAG: I63.9 (ICD-10-CM) - Acute CVA (cerebrovascular accident) ([place])  ? ?PERTINENT HISTORY: [name] is a 61 y.o. male admitted to hospital 4/18-19/2023 d/t sudden onset of word finding difficulty.  CT head was done which revealed old left MCA territory infarcts.  MRI brain was completed which showed a moderate area of left frontal lobe acute infarction in addition to the chronic infarcts in the left frontoparietal regions. PMHx singnificant for diabetes, hepatitis C, hypertension, hyperlipidemia.  ? ?THERAPY DIAG:  ?Aphasia ? ?  SUBJECTIVE: "pretty good"   ? ?PAIN:  ?Are you having pain? No ? ?OBJECTIVE:  ? ?TODAY'S TREATMENT:  ?05-23-21: Target verbal reasoning of job performance. 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 Able to explain pricing hierarchy reasoning with occasional min-A. Discussion on return to work- potential concerns, having conversation with supervisor about potential limitations, potential accommodations. Pt relieved to report he has had increase in recall regarding work information. Provided pt with worksheet to fill in using pertinent work information he'll need for return.  ? 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 Usual vague language exhibited, able to provide details with usual mod-A. Difficulty ID actions of target vocation and sequencing tasks they need to do to complete their role. Provide education on cognitive fatigue as pt reporting "brain stops working" re: homework completion. Addressed time of day, cognitive reserve preservation through meta-cognition strategies. Encouraged pt to re-visit tasks on which he struggled with fresh persepctive and see if he is able to generate additional items. Updates HEP to include verb SFA task.  ?  ?  ?PATIENT EDUCATION: ?Education details: see above ?Person educated: Patient and Spouse ?Education method: Explanation, Demonstration, and Handouts ?Education comprehension: verbalized understanding, returned demonstration, and needs further education ? ?HOME EXERCISE PROGRAM: ?Acct information worksheet ?  ?GOALS: ?Goals reviewed with patient? Yes ?  ?SHORT TERM GOALS: Target date: 06/08/2021 ?  ?Pt will complete communication PROM during first session.  ?Baseline: CPIB=16 ?Goal status: MET  ?  ?2.  Pt will successfully complete language tasks (e.g. SFA, VNeST) with 80% accuracy with rare min-A  ?Baseline:  ?Goal status: ongoing ?  ?3.  Pt will use trained anomia strategies when answering mod-complex questions in 80% of trials with occasional min-A over 2 sessions ?Baseline:  ?Goal status: ongoing ?  ?4.  Pt will name 7+ items in personally relevant categories with rare min A over 2 sessions ?Baseline: 05-16-21 ?Goal status: ongoing ?  ?LONG TERM GOALS: Target date: 07/06/2021 ?  ?Pt will report completion of aphasia HEP with mod-I over 1 week period ?Baseline:  ?Goal status: ongoing ?  ?2.  Pt will demonstrate usage of trained anomia strategies in 15+ minute conversation to improve functional communication over 2 sessions ?Baseline:  ?Goal status: ongoing ?  ?3.  Pt will report improved  confidence in communication abilities via PROM by d/c ?Baseline:  ?Goal status: ongoing ?  ?  ?ASSESSMENT: ?  ?CLINICAL IMPRESSION: ?Patient is a 61 y.o. M who was seen today for aphasia. Pt presents with mild aphasia per QAB administration. Frequent vague speech and word finding difficulties observed in structured tasks and in conversational speech. Reports some improvement since d/c but continues to be concerned about ability to verbally express himself. Impairment in ability to generate grammatically correct sentences in picture description tasks and in some conversational speech. Frequently has to refer to wife to fill in details when providing history and concerns. Additional c/o of saying things "I don't want to come out." Wife endorses some inappropriate speech but both tell ST pt is aware when this happens. Initiated education and training of anomia strategies to aid word finding on structured tasks and in conversation. I recommend skilled ST to address expressive aphasia.  ?  ?OBJECTIVE IMPAIRMENTS include aphasia. These impairments are limiting patient from effectively communicating at home and in community. ?No overt factors affecting potential to achieve goals and functional outcomes.  Patient will benefit from skilled SLP services to address above impairments and improve overall function. ?  ?REHAB POTENTIAL: Excellent ?  ?PLAN: ?SLP FREQUENCY: 2x/week ?  ?SLP DURATION: 8 weeks ?  ?PLANNED INTERVENTIONS: Language facilitation, Cueing hierachy, Functional tasks, SLP instruction and feedback, Compensatory strategies, and Patient/family education ? ? ?Su Monks, CCC-SLP ?05/23/2021, 3:19 PM ? ? ?

## 2021-05-24 ENCOUNTER — Other Ambulatory Visit: Payer: Self-pay | Admitting: *Deleted

## 2021-05-24 NOTE — Patient Outreach (Signed)
[facility] [employer] service manager [facility]) Care Management ? ?05/24/2021 ? ?[name] ?[birth year] ?[id number] ? ? ?RED ON EMMI ALERT - Stroke ?Day # 9 ?Date: 4/30 ?Red Alert Reason: Lost interest in things used to enjoy? YES ?  ?  ?Outreach attempt #2, unsuccessful, HIPAA compliant voice message left.  ? ? ?Plan: ?RN CM will update assigned RNCM to follow up within the next 3-4 business days. ? ?Kemper Durie, RN, MSN, CCM ?[facility] Care Management  ?Community Care Manager ?[phone] ? ?

## 2021-05-24 NOTE — Therapy (Deleted)
OUTPATIENT SPEECH LANGUAGE PATHOLOGY TREATMENT NOTE   Patient Name: [name] MRN: [id number] DOB:1960-09-11, 61 y.o., male Today's Date: 05/24/2021  PCP: [name], MD REFERRING PROVIDER: [name], MD  END OF SESSION:       Past Medical History:  Diagnosis Date   Diabetic neuropathy [facility])    Diverticulitis    History of hepatitis C    has been treated in the past   History of kidney stones    Hyperlipidemia    Hypertension    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[place])    Other testicular hypofunction    Severe sepsis ([place]) 08/26/2019   Type II or unspecified type diabetes mellitus without mention of complication, not stated as uncontrolled    Vitamin D deficiency    Past Surgical History:  Procedure Laterality Date   AMPUTATION Right 09/01/2019   Procedure: RIGHT BELOW KNEE AMPUTATION;  Surgeon: Newt Minion, MD;  Location: [facility];  Service: Orthopedics;  Laterality: Right;   CHOLECYSTECTOMY  1987   COLONOSCOPY     I & D EXTREMITY Right 08/20/2019   Procedure: IRRIGATION & DEBRIDEMENT RIGHT FOOT PARTIAL CALCANEAL EXCISION;  Surgeon: Newt Minion, MD;  Location: [facility];  Service: Orthopedics;  Laterality: Right;   I & D EXTREMITY Right 08/27/2019   Procedure: IRRIGATION AND DEBRIDEMENT  OF FOOT;  Surgeon: Leandrew Koyanagi, MD;  Location: [place];  Service: Orthopedics;  Laterality: Right;   ORIF TIBIA FRACTURE     x 3 right leg   Patient Active Problem List   Diagnosis Date Noted   Cerebral thrombosis with cerebral infarction 05/09/2021   Aphasia 05/08/2021   Leukocytosis 05/08/2021   Right below-knee amputee ([place]) 09/03/2019   Hyponatremia 08/26/2019   Cutaneous abscess of right foot    Poorly controlled diabetes mellitus ([place]) 07/16/2019   FHx: heart disease 10/08/2017   Type 2 diabetes mellitus with stage 2 chronic kidney disease, without long-term current use of insulin ([place]) 10/08/2017   Insomnia 07/08/2017   Smoker 08/17/2015   COPD (chronic  obstructive pulmonary disease) ([place]) 08/17/2015   T2_NIDDM w/Peripheral Neuropathy 04/01/2014   CKD stage 2 due to type 2 diabetes mellitus ([place]) 08/31/2013   Medication management 05/19/2013   Morbid obesity (BMI 35) 05/19/2013   Essential hypertension    Hyperlipidemia associated with type 2 diabetes mellitus ([place])    Vitamin D deficiency    History of kidney stones    Testosterone deficiency    History of hepatitis C     ONSET DATE: 05/08/2021  REFERRING DIAG: I63.9 (ICD-10-CM) - Acute CVA (cerebrovascular accident) ([place])   PERTINENT HISTORY: [name] is a 61 y.o. male admitted to hospital 4/18-19/2023 d/t sudden onset of word finding difficulty.  CT head was done which revealed old left MCA territory infarcts.  MRI brain was completed which showed a moderate area of left frontal lobe acute infarction in addition to the chronic infarcts in the left frontoparietal regions. PMHx singnificant for diabetes, hepatitis C, hypertension, hyperlipidemia.   THERAPY DIAG:  Aphasia  SUBJECTIVE: "***"    PAIN:  Are you having pain? No  OBJECTIVE:   TODAY'S TREATMENT:  05-25-21: ***  05-23-21: Target verbal reasoning of job performance. 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 Able to explain pricing hierarchy reasoning with occasional min-A. Discussion on return to work- potential concerns, having conversation with supervisor about potential limitations, potential accommodations. Pt relieved to report he has had increase in  recall regarding work information. Provided pt with worksheet to fill in using pertinent work information he'll need for return.   05-18-21: Target organization, descriptions, sequencing in SFA task in which pt asked to describe work of employees at his workplace. Usual vague language exhibited, able to provide details with usual mod-A. Difficulty ID actions of target vocation and sequencing tasks they need to do to complete their role. Provide  education on cognitive fatigue as pt reporting "brain stops working" re: homework completion. Addressed time of day, cognitive reserve preservation through meta-cognition strategies. Encouraged pt to re-visit tasks on which he struggled with fresh persepctive and see if he is able to generate additional items. Updates HEP to include verb SFA task.      PATIENT EDUCATION: Education details: see above Person educated: Patient and Spouse Education method: Explanation, Demonstration, and Handouts Education comprehension: verbalized understanding, returned demonstration, and needs further education  HOME EXERCISE PROGRAM: Acct information worksheet   GOALS: Goals reviewed with patient? Yes   SHORT TERM GOALS: Target date: 06/08/2021   Pt will complete communication PROM during first session.  Baseline: CPIB=16 Goal status: MET    2.  Pt will successfully complete language tasks (e.g. SFA, VNeST) with 80% accuracy with rare min-A  Baseline:  Goal status: ongoing   3.  Pt will use trained anomia strategies when answering mod-complex questions in 80% of trials with occasional min-A over 2 sessions Baseline:  Goal status: ongoing   4.  Pt will name 7+ items in personally relevant categories with rare min A over 2 sessions Baseline: 05-16-21 Goal status: ongoing   LONG TERM GOALS: Target date: 07/06/2021   Pt will report completion of aphasia HEP with mod-I over 1 week period Baseline:  Goal status: ongoing   2.  Pt will demonstrate usage of trained anomia strategies in 15+ minute conversation to improve functional communication over 2 sessions Baseline:  Goal status: ongoing   3.  Pt will report improved confidence in communication abilities via PROM by d/c Baseline:  Goal status: ongoing     ASSESSMENT:   CLINICAL IMPRESSION: Patient is a 61 y.o. M who was seen today for aphasia. Pt presents with mild aphasia per QAB administration. Frequent vague speech and word finding  difficulties observed in structured tasks and in conversational speech. Reports some improvement since d/c but continues to be concerned about ability to verbally express himself. Impairment in ability to generate grammatically correct sentences in picture description tasks and in some conversational speech. Frequently has to refer to wife to fill in details when providing history and concerns. Additional c/o of saying things "I don't want to come out." Wife endorses some inappropriate speech but both tell ST pt is aware when this happens. Initiated education and training of anomia strategies to aid word finding on structured tasks and in conversation. I recommend skilled ST to address expressive aphasia.    OBJECTIVE IMPAIRMENTS include aphasia. These impairments are limiting patient from effectively communicating at home and in community. No overt factors affecting potential to achieve goals and functional outcomes. Patient will benefit from skilled SLP services to address above impairments and improve overall function.   REHAB POTENTIAL: Excellent   PLAN: SLP FREQUENCY: 2x/week   SLP DURATION: 8 weeks   PLANNED INTERVENTIONS: Language facilitation, Cueing hierachy, Functional tasks, SLP instruction and feedback, Compensatory strategies, and Patient/family education   Su Monks, [place] 05/24/2021, 4:06 PM

## 2021-05-25 ENCOUNTER — Ambulatory Visit: Payer: 59 | Admitting: Speech Pathology

## 2021-05-25 DIAGNOSIS — R4701 Aphasia: Secondary | ICD-10-CM

## 2021-05-29 ENCOUNTER — Ambulatory Visit: Payer: 59 | Admitting: Speech Pathology

## 2021-05-29 DIAGNOSIS — R4701 Aphasia: Secondary | ICD-10-CM | POA: Diagnosis not present

## 2021-05-29 NOTE — Therapy (Signed)
?OUTPATIENT SPEECH LANGUAGE PATHOLOGY TREATMENT NOTE ? ? ?Patient Name: [name] ?MRN: [id number] ?DOB:[birth year], 61 y.o., male ?Today's Date: 05/29/2021 ? ?PCP: Michael Pinto, MD ?REFERRING PROVIDER: [name], MD ? ?END OF SESSION:  ? End of Session - 05/29/21 1527   ? ? Visit Number 5   ? Number of Visits 17   ? Date for SLP Re-Evaluation 07/06/21   ? Authorization Type UHC   ? SLP Start Time 1530   ? SLP Stop Time  1613   ? SLP Time Calculation (min) 43 min   ? Activity Tolerance Patient tolerated treatment well   ? ?  ?  ? ?  ? ? ? ? ? ?Past Medical History:  ?Diagnosis Date  ? Diabetic neuropathy ([place])   ? Diverticulitis   ? History of hepatitis C   ? has been treated in the past  ? History of kidney stones   ? Hyperlipidemia   ? Hypertension   ? 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[place])   ? Other testicular hypofunction   ? Severe sepsis ([place]) 08/26/2019  ? Type II or unspecified type diabetes mellitus without mention of complication, not stated as uncontrolled   ? Vitamin D deficiency   ? ?Past Surgical History:  ?Procedure Laterality Date  ? AMPUTATION Right 09/01/2019  ? Procedure: RIGHT BELOW KNEE AMPUTATION;  Surgeon: Newt Minion, MD;  Location: [place];  Service: Orthopedics;  Laterality: Right;  ? CHOLECYSTECTOMY  1987  ? COLONOSCOPY    ? I & D EXTREMITY Right 08/20/2019  ? Procedure: IRRIGATION & DEBRIDEMENT RIGHT FOOT PARTIAL CALCANEAL EXCISION;  Surgeon: Newt Minion, MD;  Location: [place];  Service: Orthopedics;  Laterality: Right;  ? I & D EXTREMITY Right 08/27/2019  ? Procedure: IRRIGATION AND DEBRIDEMENT  OF FOOT;  Surgeon: Leandrew Koyanagi, MD;  Location: [place];  Service: Orthopedics;  Laterality: Right;  ? ORIF TIBIA FRACTURE    ? x 3 right leg  ? ?Patient Active Problem List  ? Diagnosis Date Noted  ? Cerebral thrombosis with cerebral infarction 05/09/2021  ? Aphasia 05/08/2021  ? Leukocytosis 05/08/2021  ? Right below-knee amputee ([place]) 09/03/2019  ? Hyponatremia 08/26/2019  ? Cutaneous  abscess of right foot   ? Poorly controlled diabetes mellitus ([place]) 07/16/2019  ? FHx: heart disease 10/08/2017  ? Type 2 diabetes mellitus with stage 2 chronic kidney disease, without long-term current use of insulin ([place]) 10/08/2017  ? Insomnia 07/08/2017  ? Smoker 08/17/2015  ? COPD (chronic obstructive pulmonary disease) ([place]) 08/17/2015  ? T2_NIDDM w/Peripheral Neuropathy 04/01/2014  ? CKD stage 2 due to type 2 diabetes mellitus ([place]) 08/31/2013  ? Medication management 05/19/2013  ? Morbid obesity (BMI 35) 05/19/2013  ? Essential hypertension   ? Hyperlipidemia associated with type 2 diabetes mellitus ([place])   ? Vitamin D deficiency   ? History of kidney stones   ? Testosterone deficiency   ? History of hepatitis C   ? ? ?ONSET DATE: 05/08/2021 ? ?REFERRING DIAG: I63.9 (ICD-10-CM) - Acute CVA (cerebrovascular accident) ([place])  ? ?PERTINENT HISTORY: [name] is a 61 y.o. male admitted to hospital 4/18-19/2023 d/t sudden onset of word finding difficulty.  CT head was done which revealed old left MCA territory infarcts.  MRI brain was completed which showed a moderate area of left frontal lobe acute infarction in addition to the chronic infarcts in the left frontoparietal regions. PMHx singnificant for diabetes, hepatitis C, hypertension, hyperlipidemia.  ? ?THERAPY DIAG:  ?  Aphasia ? ?SUBJECTIVE: "doing much better"   ? ?PAIN:  ?Are you having pain? No ? ?OBJECTIVE:  ? ?TODAY'S TREATMENT:  ?05-29-21: Pt's wife tells ST pt has been having increased frustration and low frustration tolerance since stroke. Asking about if this will get better this time. ST advises that they report these changes to neurologist at next appointment. [place] list 1 to assess pt's spelling as this is reported problematic area for pt since stroke. Must write a lot for work and is experiencing many errors. Results as follows:  ?15/20 regularly spelled words ?11/20 irregularly spelled words ?15/20 high frequency  words ?10/20 low frequency words ?High error awareness observed during assessment but lacks ability to correct. Mixing up letters, adding extra letters, some semantic substitutions.  ? ?05-23-21: Target verbal reasoning of job performance. 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 Able to explain pricing hierarchy reasoning with occasional min-A. Discussion on return to work- potential concerns, having conversation with supervisor about potential limitations, potential accommodations. Pt relieved to report he has had increase in recall regarding work information. Provided pt with worksheet to fill in using pertinent work information he'll need for return.  ? ?05-18-21: Target organization, descriptions, sequencing in SFA task in which pt asked to describe work of employees at his workplace. Usual vague language exhibited, able to provide details with usual mod-A. Difficulty ID actions of target vocation and sequencing tasks they need to do to complete their role. Provide education on cognitive fatigue as pt reporting "brain stops working" re: homework completion. Addressed time of day, cognitive reserve preservation through meta-cognition strategies. Encouraged pt to re-visit tasks on which he struggled with fresh persepctive and see if he is able to generate additional items. Updates HEP to include verb SFA task.  ?  ?  ?PATIENT EDUCATION: ?Education details: see above ?Person educated: Patient and Spouse ?Education method: Explanation, Demonstration, and Handouts ?Education comprehension: verbalized understanding, returned demonstration, and needs further education ? ?HOME EXERCISE PROGRAM: ?Acct information worksheet ?  ?GOALS: ?Goals reviewed with patient? Yes ?  ?SHORT TERM GOALS: Target date: 06/08/2021 ?  ?Pt will complete communication PROM during first session.  ?Baseline: CPIB=16 ?Goal status: MET  ?  ?2.  Pt will successfully complete language tasks (e.g. SFA, VNeST) with 80%  accuracy with rare min-A  ?Baseline:  ?Goal status: ongoing ?  ?3.  Pt will use trained anomia strategies when answering mod-complex questions in 80% of trials with occasional min-A over 2 sessions ?Baseline:  ?Goal status: ongoing ?  ?4.  Pt will name 7+ items in personally relevant categories with rare min A over 2 sessions ?Baseline: 05-16-21 ?Goal status: ongoing ?  ?LONG TERM GOALS: Target date: 07/06/2021 ?  ?Pt will report completion of aphasia HEP with mod-I over 1 week period ?Baseline:  ?Goal status: ongoing ?  ?2.  Pt will demonstrate usage of trained anomia strategies in 15+ minute conversation to improve functional communication over 2 sessions ?Baseline:  ?Goal status: ongoing ?  ?3.  Pt will report improved confidence in communication abilities via PROM by d/c ?Baseline:  ?Goal status: ongoing ?  ?  ?ASSESSMENT: ?  ?CLINICAL IMPRESSION: ?Patient is a 61 y.o. M who was seen today for aphasia. Pt presents with mild aphasia per QAB administration. Frequent vague speech and word finding difficulties observed in structured tasks and in conversational speech. Reports some improvement since d/c but continues to be concerned about ability to verbally express himself.  Impairment in ability to generate grammatically correct sentences in picture description tasks and in some conversational speech. Frequently has to refer to wife to fill in details when providing history and concerns. Additional c/o of saying things "I don't want to come out." Wife endorses some inappropriate speech but both tell ST pt is aware when this happens. Initiated education and training of anomia strategies to aid word finding on structured tasks and in conversation. I recommend skilled ST to address expressive aphasia.  ?  ?OBJECTIVE IMPAIRMENTS include aphasia. These impairments are limiting patient from effectively communicating at home and in community. ?No overt factors affecting potential to achieve goals and functional outcomes.  Patient will benefit from skilled SLP services to address above impairments and improve overall function. ?  ?REHAB POTENTIAL: Excellent ?  ?PLAN: ?SLP FREQUENCY: 2x/week ?  ?SLP DURATION: 8 weeks ?  ?PLANNED INTERVENT

## 2021-05-31 ENCOUNTER — Other Ambulatory Visit: Payer: Self-pay | Admitting: *Deleted

## 2021-05-31 NOTE — Patient Outreach (Signed)
[facility]) Care Management ?Telephonic RN Care Manager Note ? ? ?05/31/2021 ?Name:  [name] MRN:  [id number] DOB:  11-01-1960 ? ?Summary: ?Third unsuccessful outreach ?Updated on unsuccessful outreaches on 05/21/21 & 05/24/21 by co worker, Monica L  ?Unsuccessful outreach letter mailed on 05/21/21 ?[facility] Unsuccessful outreach to [phone] Outreach attempt to the listed at the preferred outreach number in EPIC  ?Mrs [name] answered & reports she was at work. She confirms pt home/mobile number he could be reached at, wants to continue to have her number as preferred cone outreach number  ? Unsuccessful outreach to [phone] ?No answer. [facility] RN CM left HIPAA [facility] Portability and Accountability Act) compliant voicemail message along with CM?s contact info.  ? ? ?Subjective: ?[name] is an 61 y.o. year old male who is a primary patient of Lucky Cowboy, MD. The care management team was consulted for assistance with care management and/or care coordination needs.  ?RED ON EMMI ALERT - Stroke ?Day # 9 ?Date: 4/30 ?Red Alert Reason: Lost interest in things used to enjoy? YES  ? ?Telephonic RN Care Manager completed Telephone Visit today.  ? ?Objective: ? ?Medications Reviewed Today   ? ? Reviewed by Maia Breslow, CCC-SLP (Speech and Language Pathologist) on 05/29/21 at 1529  Med List Status: <None>  ? ?Medication Order Taking? Sig Documenting Provider Last Dose Status Informant  ?aspirin 81 MG tablet [id number] No Take 1 tablet (81 mg total) by mouth daily. Take along with plavix for 3 months, after 3 months stop aspirin and continue plavix Ghimire, Werner Lean, MD Taking Active   ?cetirizine (ZYRTEC) 10 MG tablet [id number] No Take 10 mg by mouth daily as needed for allergies. [provider] Taking Active Spouse/Significant Other  ?Cholecalciferol (VITAMIN D) 50 MCG (2000 UT) CAPS [id number] No Take 2,000 Units by mouth 2 (two) times daily. [provider] Taking Active Spouse/Significant Other  ?citalopram (CELEXA) 40 MG tablet [id number] No Take 1 tablet Daily for Mood & Chronic Anxiety Lucky Cowboy, MD Taking Active Spouse/Significant Other  ?clopidogrel (PLAVIX) 75 MG tablet [id number] No Take 1 tablet (75 mg total) by mouth daily. Maretta Bees, MD Taking Active   ?Continuous Blood Gluc Sensor (FREESTYLE LIBRE SENSOR SYSTEM) MISC [id number] No 1 each by Does not apply route daily. Horton Chin, MD Taking Active Spouse/Significant Other  ?Dulaglutide (TRULICITY) 0.75 MG/0.5ML SOPN [id number] No Inject 0.75 mg into the skin once a week.  ?Patient taking differently: Inject 0.75 mg into the skin once a week. Friday  ? Revonda Humphrey, NP Taking Active Spouse/Significant Other  ?hydrochlorothiazide (HYDRODIURIL) 25 MG tablet [id number] No Take  1 tablet  Daily  for BP & Fluid Retention / Ankle Swelling  ?Patient taking differently: Take 12.5 mg by mouth daily. for BP & Fluid Retention / Ankle Swelling  ? Lucky Cowboy, MD Taking Active Spouse/Significant Other  ?lisinopril (ZESTRIL) 20 MG tablet [id number] No Take 1 tablet Daily for BP & Kidney Protection Lucky Cowboy, MD Taking Active Spouse/Significant Other  ?metFORMIN (GLUCOPHAGE-XR) 500 MG 24 hr tablet [id number] No Take 2 tablets 2 x /day with Meals  for Diabetes  ?Patient taking differently: 1,000 mg 2 (two) times daily. Take 1 tablet in the morning and 2 in the evening  ? Lucky Cowboy, MD Taking Active Spouse/Significant Other  ?pantoprazole (PROTONIX) 40 MG tablet [id number] No Take 1 tablet (40 mg total) by mouth daily. Maretta Bees, MD Taking Active   ?  rosuvastatin (CRESTOR) 40 MG tablet [id number] No Take one tablet daily for Cholesterol.  ?Patient taking differently: Take 40 mg by mouth daily. Take one tablet daily for Cholesterol.  ? Lucky Cowboy, MD Taking Active Spouse/Significant Other  ?Study - OCEANIC-STROKE - asundexian 50 mg or placebo tablet (PI-Sethi) [id number] No  Take 1 tablet (50 mg total) by mouth daily. For investigational use only. Take 1 tablet by mouth once daily at the same time each day, preferably in the morning. Please bring bottle to every visit. Maretta Bees, MD Taking Active   ?verapamil (CALAN-SR) 240 MG CR tablet [id number]  Take 1 tablet Daily for BP & Heart Rhythm                                /                         TAKE                              BY                       MOUTH                  ONCE DAILY Lucky Cowboy, MD  Active   ? ?  ?  ? ?  ? ? ? ?SDOH:  (Social Determinants of Health) assessments and interventions performed:  ? ? ?Care Plan ? ?Review of patient past medical history, allergies, medications, health status, including review of consultants reports, laboratory and other test data, was performed as part of comprehensive evaluation for care management services.  ? ?There are no care plans that you recently modified to display for this patient. ?  ? ?Plan:  pended for possible [facility] case closure pending at return call from patient within the next 10 business days ?The patient has been provided with contact information for the care management team and has been advised to call with any health related questions or concerns.  ? ?Fayette Gasner L. Noelle Penner, RN, BSN, CCM ?[facility] Telephonic Care Management Care Coordinator ?Office number [phone] ?[facility] number [phone] ?Fax number [phone] ? ? ? ? ? ?

## 2021-06-01 ENCOUNTER — Ambulatory Visit: Payer: 59 | Admitting: Speech Pathology

## 2021-06-06 ENCOUNTER — Ambulatory Visit: Payer: 59 | Admitting: Speech Pathology

## 2021-06-06 DIAGNOSIS — R4701 Aphasia: Secondary | ICD-10-CM

## 2021-06-06 NOTE — Progress Notes (Signed)
[facility] [address] [place]. [place] [address] [phone]       HOSPITAL FOLLOW UP NOTE  Mr. [name] Date of Birth:  [birth year] Medical Record Number:  [id number]   Reason for Referral:  hospital stroke follow up   SUBJECTIVE:   CHIEF COMPLAINT:  Chief Complaint  Patient presents with   New Patient (Initial Visit)    Rm 2, w wife [name]. Here to f/u from recent hospital visit on 05/07/21, seen by Dr. Erlinda Hong.     HPI:   [name] is a 60 y.o. who  has a past medical history of Diabetic neuropathy ([place]), Diverticulitis, History of hepatitis C, History of kidney stones, Hyperlipidemia, Hypertension, 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[place]), Other testicular hypofunction, Severe sepsis ([place]) (08/26/2019), Type II or unspecified type diabetes mellitus without mention of complication, not stated as uncontrolled, and Vitamin D deficiency.  Patient presented on 05/07/2021 with sudden onset of aphasia. 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 UDS positive for THC. Outside window for tPA. He was started on asa and Plavix for three months then Plavix alone. He was discharged home 05/09/2021. No recommendations from PT/OT. ST recommended outpatient therapy. Personally reviewed hospitalization pertinent progress notes, lab work and imaging.  Evaluated by Dr Erlinda Hong.   Since discharge home, he is doing fairly well. He continues to have difficulty finding correct words. He has difficulty with recall. He has difficulty with day/time orientation. He has not returned to work. He continues to [place] with ST twice weekly. He has some difficulty with irritability and frustration. He continues close follow up with Dr Melford Aase. Now taking citalopram.   CBGs were running 250+ but now seem to be closer to 110-120 in the mornings. He is tolerating metformin and Trulicity. He reports BP has been much lower at home. He continues verapamil  and lisinopril. He continues rosuvastatin, asa 81mg  and Plavix.   He was enrolled in [place] research trial. He has 30 day follow up with research, today.    PERTINENT IMAGING/LABS  CT old left MCA/PCA small infarct CTA head and neck left ICA occlusion likely chronic, right ICA 40% stenosis MRI  acute left frontal infarct, old left frontoparietal infarct 2D Echo  60-65%   A1C Lab Results  Component Value Date   HGBA1C 7.5 (H) 05/09/2021    Lipid Panel     Component Value Date/Time   CHOL 88 05/09/2021 0312   TRIG 223 (H) 05/09/2021 0312   HDL 20 (L) 05/09/2021 0312   CHOLHDL 4.4 05/09/2021 0312   VLDL 45 (H) 05/09/2021 0312   LDLCALC 23 05/09/2021 0312   LDLCALC 52 04/18/2021 1155    ROS:   14 system review of systems performed and negative with exception of those listed in HPI  PMH:  Past Medical History:  Diagnosis Date   Diabetic neuropathy ([place])    Diverticulitis    History of hepatitis C    has been treated in the past   History of kidney stones    Hyperlipidemia    Hypertension    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[place])    Other testicular hypofunction    Severe sepsis ([place]) 08/26/2019   Type II or unspecified type diabetes mellitus without mention of complication, not stated as uncontrolled    Vitamin D deficiency     PSH:  Past Surgical History:  Procedure Laterality Date   AMPUTATION Right 09/01/2019   Procedure: RIGHT BELOW  KNEE AMPUTATION;  Surgeon: Newt Minion, MD;  Location: [facility];  Service: Orthopedics;  Laterality: Right;   CHOLECYSTECTOMY  1987   COLONOSCOPY     I & D EXTREMITY Right 08/20/2019   Procedure: IRRIGATION & DEBRIDEMENT RIGHT FOOT PARTIAL CALCANEAL EXCISION;  Surgeon: Newt Minion, MD;  Location: [facility];  Service: Orthopedics;  Laterality: Right;   I & D EXTREMITY Right 08/27/2019   Procedure: IRRIGATION AND DEBRIDEMENT  OF FOOT;  Surgeon: Leandrew Koyanagi, MD;  Location: [facility];  Service: Orthopedics;  Laterality: Right;   ORIF TIBIA  FRACTURE     x 3 right leg    Social History:  Social History   Socioeconomic History   Marital status: Married    Spouse name: [name]   Number of children: 0   Years of education: Not on file   Highest education level: High school graduate  Occupational History   Not on file  Tobacco Use   Smoking status: Former    Packs/day: 1.00    Types: Cigarettes    Quit date: 05/07/2021    Years since quitting: 0.0   Smokeless tobacco: Never  Vaping Use   Vaping Use: Never used  Substance and Sexual Activity   Alcohol use: No   Drug use: Yes    Types: Marijuana   Sexual activity: Not on file  Other Topics Concern   Not on file  Social History Narrative   Lives with wife teresa   L handed   Caffeine: 2 C of coffee a day and 2-3 diet mth dews a day   Social Determinants of Health   Financial Resource Strain: Not on file  Food Insecurity: Not on file  Transportation Needs: Not on file  Physical Activity: Not on file  Stress: Not on file  Social Connections: Not on file  Intimate Partner Violence: Not on file    Family History:  Family History  Problem Relation Age of Onset   Hypertension Mother    Cancer Father        colon   Alzheimer's disease Father    Stroke Father     Medications:   Current Outpatient Medications on File Prior to Visit  Medication Sig Dispense Refill   aspirin 81 MG tablet Take 1 tablet (81 mg total) by mouth daily. Take along with plavix for 3 months, after 3 months stop aspirin and continue plavix 30 tablet    cetirizine (ZYRTEC) 10 MG tablet Take 10 mg by mouth daily as needed for allergies.     Cholecalciferol (VITAMIN D) 50 MCG (2000 UT) CAPS Take 2,000 Units by mouth 2 (two) times daily.     citalopram (CELEXA) 40 MG tablet Take 1 tablet Daily for Mood & Chronic Anxiety 90 tablet 3   clopidogrel (PLAVIX) 75 MG tablet Take 1 tablet (75 mg total) by mouth daily. 90 tablet 3   Dulaglutide (TRULICITY) [id number] SOPN Inject 0.75 mg into  the skin once a week. (Patient taking differently: Inject 0.75 mg into the skin once a week. Friday) 2 mL 3   hydrochlorothiazide (HYDRODIURIL) 25 MG tablet Take  1 tablet  Daily  for BP & Fluid Retention / Ankle Swelling (Patient taking differently: Take 12.5 mg by mouth daily. for BP & Fluid Retention / Ankle Swelling) 90 tablet 3   lisinopril (ZESTRIL) 20 MG tablet Take 1 tablet Daily for BP & Kidney Protection 90 tablet 3   metFORMIN (GLUCOPHAGE-XR) 500 MG 24 hr tablet Take  2 tablets 2 x /day with Meals  for Diabetes (Patient taking differently: 1,000 mg 2 (two) times daily. Take 1 tablet in the morning and 2 in the evening) 360 tablet 3   pantoprazole (PROTONIX) 40 MG tablet Take 1 tablet (40 mg total) by mouth daily. 30 tablet 1   rosuvastatin (CRESTOR) 40 MG tablet Take one tablet daily for Cholesterol. (Patient taking differently: Take 40 mg by mouth daily. Take one tablet daily for Cholesterol.) 90 tablet 3   Study - OCEANIC-STROKE - asundexian 50 mg or placebo tablet (PI-Sethi) Take 1 tablet (50 mg total) by mouth daily. For investigational use only. Take 1 tablet by mouth once daily at the same time each day, preferably in the morning. Please bring bottle to every visit. 98 tablet 0   verapamil (CALAN-SR) 240 MG CR tablet Take 1 tablet Daily for BP & Heart Rhythm                                /                         TAKE                              BY                       MOUTH                  ONCE DAILY 90 tablet 3   No current facility-administered medications on file prior to visit.    Allergies:   Allergies  Allergen Reactions   Invokamet [Canagliflozin-Metformin Hcl] Other (See Comments)    Extremity edema/caused pain   Invokana [Canagliflozin] Other (See Comments)    Extremity edema/caused pain   Codeine Camsylate [Codeine] Rash   Morphine And Related Rash    OBJECTIVE:  Physical Exam  Vitals:   06/11/21 0822  BP: 118/66  Pulse: 68  Weight: 262 lb (118.8 kg)   Height: 6\' 3"  (1.905 m)   Body mass index is 32.75 kg/m. No results found.     07/23/2020    2:29 PM  Depression screen PHQ 2/9  Decreased Interest 0  Down, Depressed, Hopeless 0  PHQ - 2 Score 0     General: well developed, well nourished, seated, in no evident distress Head: head normocephalic and atraumatic.   Neck: supple with no carotid or supraclavicular bruits Cardiovascular: regular rate and rhythm, no murmurs Musculoskeletal: no deformity Skin:  no rash/petichiae Vascular:  Normal pulses all extremities   Neurologic Exam Mental Status: Awake and fully alert. mild dysarthria due to poor dentition.  Oriented to place and time. Recent and remote memory intact. Attention span, concentration and fund of knowledge appropriate. Mood and affect appropriate.  Cranial Nerves: Fundoscopic exam reveals sharp disc margins. Pupils equal, briskly reactive to light. Extraocular movements full without nystagmus. Visual fields full to confrontation. Hearing intact. Facial sensation intact. Face, tongue, palate moves normally and symmetrically.  Motor: Normal bulk and tone. Normal strength in all tested extremity muscles Sensory.: intact to touch , pinprick , position and vibratory sensation.  Coordination: Rapid alternating movements normal in all extremities. Finger-to-nose and heel-to-shin performed accurately bilaterally. Gait and Station: Arises from chair without difficulty. Stance is normal. Gait demonstrates normal stride length and balance with  no assistive device. Tandem walk and heel toe not performed, today.  Reflexes: 1+ and symmetric.    NIHSS  0 Modified Rankin  0   ASSESSMENT: ANTERIO DEMLOW is a 61 y.o. year old male presenting to the ER 05/07/2021 following sudden onset of aphasia.. Vascular risk factors include HTN, HLD, DMT2 uncontrolled, left ICA stenosis, OVD s/p R BKA, previous stroke, obesity, tobacco abuse, THC positive.    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 Continue aspirin 81 mg daily and clopidogrel 75 mg daily  for three months then Plavix alone and rosuvastatin 40mg  daily  for secondary stroke prevention.  Discussed secondary stroke prevention measures and importance of close PCP follow up for aggressive stroke risk factor management. I have gone over the pathophysiology of stroke, warning signs and symptoms, risk factors and their management in some detail with instructions to go to the closest emergency room for symptoms of concern. HTN: BP goal <130/90.  Stable on verapamil 240mg  daily and lisinopril 20mg  daily per PCP. Avoid low BP due to left ICA stenosis.  HLD: LDL goal <70. Recent LDL 23. Continue rosuvastatin 40mg  daily per PCP.  DMII: A1c goal<7.0. Recent A1c 7.5. Continue metformin ER and Trulicity per PCP.  Tobacco abuse: consider smoking cessation  Left ICA stenosis: continue close follow up with PCP PVD s/p R BKA: continue close follow up with PCP  Obesity: healthy lifestyle habits with low carb diet and regular physical exercise. Tobacco abuse: continue cessation THC positive: consider cessation    Follow up in 6 months or call earlier if needed   CC:  [facility] provider: Dr. Leonie Man PCP: [name], MD    I spent 45 minutes of face-to-face and non-face-to-face time with patient.  This included previsit chart review including review of recent hospitalization, lab review, study review, order entry, electronic health record documentation, patient education regarding recent stroke including etiology, secondary stroke prevention measures and importance of managing stroke risk factors, residual deficits and typical recovery time and answered all other questions to patient satisfaction   Debbora Presto, [facility]  [facility] [address] [place], [place]  [address]  Phone [phone] Fax [phone] Note: This document was prepared with digital dictation and possible smart phrase technology. Any transcriptional errors that result from this process are unintentional.

## 2021-06-06 NOTE — Therapy (Signed)
?OUTPATIENT SPEECH LANGUAGE PATHOLOGY TREATMENT NOTE ? ? ?Patient Name: [name] ?MRN: [id number] ?DOB:1960-12-05, 61 y.o., male ?Today's Date: 06/06/2021 ? ?PCP: Michael Pinto, MD ?REFERRING PROVIDER: [name], MD ? ?END OF SESSION:  ? End of Session - 06/06/21 1450   ? ? Visit Number 6   ? Number of Visits 17   ? Date for SLP Re-Evaluation 07/06/21   ? Authorization Type UHC   ? SLP Start Time 1450   ? SLP Stop Time  1533   ? SLP Time Calculation (min) 43 min   ? Activity Tolerance Patient tolerated treatment well   ? ?  ?  ? ?  ? ? ? ? ? ? ?Past Medical History:  ?Diagnosis Date  ? Diabetic neuropathy ([place])   ? Diverticulitis   ? History of hepatitis C   ? has been treated in the past  ? History of kidney stones   ? Hyperlipidemia   ? Hypertension   ? 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[place])   ? Other testicular hypofunction   ? Severe sepsis ([place]) 08/26/2019  ? Type II or unspecified type diabetes mellitus without mention of complication, not stated as uncontrolled   ? Vitamin D deficiency   ? ?Past Surgical History:  ?Procedure Laterality Date  ? AMPUTATION Right 09/01/2019  ? Procedure: RIGHT BELOW KNEE AMPUTATION;  Surgeon: Michael Minion, MD;  Location: [facility];  Service: Orthopedics;  Laterality: Right;  ? CHOLECYSTECTOMY  1987  ? COLONOSCOPY    ? I & D EXTREMITY Right 08/20/2019  ? Procedure: IRRIGATION & DEBRIDEMENT RIGHT FOOT PARTIAL CALCANEAL EXCISION;  Surgeon: Michael Minion, MD;  Location: [facility];  Service: Orthopedics;  Laterality: Right;  ? I & D EXTREMITY Right 08/27/2019  ? Procedure: IRRIGATION AND DEBRIDEMENT  OF FOOT;  Surgeon: Michael Koyanagi, MD;  Location: [facility];  Service: Orthopedics;  Laterality: Right;  ? ORIF TIBIA FRACTURE    ? x 3 right leg  ? ?Patient Active Problem List  ? Diagnosis Date Noted  ? Cerebral thrombosis with cerebral infarction 05/09/2021  ? Aphasia 05/08/2021  ? Leukocytosis 05/08/2021  ? Right below-knee amputee ([place]) 09/03/2019  ? Hyponatremia 08/26/2019  ? Cutaneous  abscess of right foot   ? Poorly controlled diabetes mellitus ([place]) 07/16/2019  ? FHx: heart disease 10/08/2017  ? Type 2 diabetes mellitus with stage 2 chronic kidney disease, without long-term current use of insulin ([place]) 10/08/2017  ? Insomnia 07/08/2017  ? Smoker 08/17/2015  ? COPD (chronic obstructive pulmonary disease) ([place]) 08/17/2015  ? T2_NIDDM w/Peripheral Neuropathy 04/01/2014  ? CKD stage 2 due to type 2 diabetes mellitus ([place]) 08/31/2013  ? Medication management 05/19/2013  ? Morbid obesity (BMI 35) 05/19/2013  ? Essential hypertension   ? Hyperlipidemia associated with type 2 diabetes mellitus ([place])   ? Vitamin D deficiency   ? History of kidney stones   ? Testosterone deficiency   ? History of hepatitis C   ? ? ?ONSET DATE: 05/08/2021 ? ?REFERRING DIAG: I63.9 (ICD-10-CM) - Acute CVA (cerebrovascular accident) ([place])  ? ?PERTINENT HISTORY: [name] is a 61 y.o. male admitted to hospital 4/18-19/2023 d/t sudden onset of word finding difficulty.  CT head was done which revealed old left MCA territory infarcts.  MRI brain was completed which showed a moderate area of left frontal lobe acute infarction in addition to the chronic infarcts in the left frontoparietal regions. PMHx singnificant for diabetes, hepatitis C, hypertension, hyperlipidemia.  ? ?THERAPY DIAG:  ?  Aphasia ? ?SUBJECTIVE: "about the same"   ? ?PAIN:  ?Are you having pain? No ? ?OBJECTIVE:  ? ?TODAY'S TREATMENT:  ?06-06-21: Pt reports to starting to notice things he's having trouble with. Example given, pt having difficulty orienting to and manipulating time, schedule management challenge. Education on implementation of calendar system to provide visual cue to assist in orientation. Training on morning routine to include referencing calendar and spouse referencing calendar when discussing appointment times/dates. Report decreased motivation and desire to interact with others. Report some R visual neglect. Target spelling d/t continued  reports of difficulty in this area. Use of [name] Word List, focus on irregularly spelled words. Initiated training use of Problem Solving Approach to spelling. Education on process of treatment and use of spell check. Pt and spouse report access to iPad to use as spell checker. Completed x11 words this date. Initial spelling accurate 100% of trials. Despite accuracy, cued for self-review following each spelling. Provide additional word list to use for home practice.  ? ?05-29-21: Pt's wife tells ST pt has been having increased frustration and low frustration tolerance since stroke. Asking about if this will get better this time. ST advises that they report these changes to neurologist at next appointment. [place] list 1 to assess pt's spelling as this is reported problematic area for pt since stroke. Must write a lot for work and is experiencing many errors. Results as follows:  ?15/20 regularly spelled words ?11/20 irregularly spelled words ?15/20 high frequency words ?10/20 low frequency words ?High error awareness observed during assessment but lacks ability to correct. Mixing up letters, adding extra letters, some semantic substitutions.  ? ?05-23-21: Target verbal reasoning of job performance. 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 Able to explain pricing hierarchy reasoning with occasional min-A. Discussion on return to work- potential concerns, having conversation with supervisor about potential limitations, potential accommodations. Pt relieved to report he has had increase in recall regarding work information. Provided pt with worksheet to fill in using pertinent work information he'll need for return.  ? ?05-18-21: Target organization, descriptions, sequencing in SFA task in which pt asked to describe work of employees at his workplace. Usual vague language exhibited, able to provide details with usual mod-A. Difficulty ID actions of target vocation and  sequencing tasks they need to do to complete their role. Provide education on cognitive fatigue as pt reporting "brain stops working" re: homework completion. Addressed time of day, cognitive reserve preservation through meta-cognition strategies. Encouraged pt to re-visit tasks on which he struggled with fresh persepctive and see if he is able to generate additional items. Updates HEP to include verb SFA task.  ?  ?  ?PATIENT EDUCATION: ?Education details: see above ?Person educated: Patient and Spouse ?Education method: Explanation, Demonstration, and Handouts ?Education comprehension: verbalized understanding, returned demonstration, and needs further education ? ?HOME EXERCISE PROGRAM: ?Problem Solving Approach to Spelling worksheet ?  ?GOALS: ?Goals reviewed with patient? Yes ?  ?SHORT TERM GOALS: Target date: 06/08/2021 ?  ?Pt will complete communication PROM during first session.  ?Baseline: CPIB=16 ?Goal status: MET  ?  ?2.  Pt will successfully complete language tasks (e.g. SFA, VNeST) with 80% accuracy with rare min-A  ?Baseline: 06-06-2021 ?Goal status: MET ?  ?3.  Pt will use trained anomia strategies when answering mod-complex questions in 80% of trials with occasional min-A over 2 sessions ?Baseline: 06-06-2021 ?Goal status: MET ?  ?4.  Pt will name 7+ items  in personally relevant categories with rare min A over 2 sessions ?Baseline: 05-16-21, 06-06-2021 ?Goal status: MET ?  ?LONG TERM GOALS: Target date: 07/06/2021 ?  ?Pt will report completion of aphasia HEP with mod-I over 1 week period ?Baseline:  ?Goal status: ongoing ?  ?2.  Pt will demonstrate usage of trained anomia strategies in 15+ minute conversation to improve functional communication over 2 sessions ?Baseline:  ?Goal status: ongoing ?  ?3.  Pt will report improved confidence in communication abilities via PROM by d/c ?Baseline:  ?Goal status: ongoing ?  ?  ?ASSESSMENT: ?  ?CLINICAL IMPRESSION: ?Patient is a 61 y.o. M who was seen today for  aphasia. Pt presents with mild aphasia per QAB administration. Frequent vague speech and word finding difficulties observed in structured tasks and in conversational speech. Reports some improvement since d/c but

## 2021-06-07 NOTE — Therapy (Addendum)
OUTPATIENT SPEECH LANGUAGE PATHOLOGY TREATMENT NOTE   Patient Name: [name] MRN: [id number] DOB:[birth year], 61 y.o., male Today's Date: 06/08/2021  PCP: [name], MD REFERRING PROVIDER: [name], MD  END OF SESSION:   End of Session - 06/08/21 1451     Visit Number 7    Number of Visits 17    Date for SLP Re-Evaluation 07/06/21    Authorization Type UHC    SLP Start Time 1451    SLP Stop Time  1531    SLP Time Calculation (min) 40 min    Activity Tolerance Patient tolerated treatment well                  Past Medical History:  Diagnosis Date   Diabetic neuropathy ([place])    Diverticulitis    History of hepatitis C    has been treated in the past   History of kidney stones    Hyperlipidemia    Hypertension    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[place])    Other testicular hypofunction    Severe sepsis ([place]) 08/26/2019   Type II or unspecified type diabetes mellitus without mention of complication, not stated as uncontrolled    Vitamin D deficiency    Past Surgical History:  Procedure Laterality Date   AMPUTATION Right 09/01/2019   Procedure: RIGHT BELOW KNEE AMPUTATION;  Surgeon: Newt Minion, MD;  Location: [facility];  Service: Orthopedics;  Laterality: Right;   CHOLECYSTECTOMY  1987   COLONOSCOPY     I & D EXTREMITY Right 08/20/2019   Procedure: IRRIGATION & DEBRIDEMENT RIGHT FOOT PARTIAL CALCANEAL EXCISION;  Surgeon: Newt Minion, MD;  Location: [facility];  Service: Orthopedics;  Laterality: Right;   I & D EXTREMITY Right 08/27/2019   Procedure: IRRIGATION AND DEBRIDEMENT  OF FOOT;  Surgeon: Leandrew Koyanagi, MD;  Location: [facility];  Service: Orthopedics;  Laterality: Right;   ORIF TIBIA FRACTURE     x 3 right leg   Patient Active Problem List   Diagnosis Date Noted   Cerebral thrombosis with cerebral infarction 05/09/2021   Aphasia 05/08/2021   Leukocytosis 05/08/2021   Right below-knee amputee ([place]) 09/03/2019   Hyponatremia 08/26/2019   Cutaneous  abscess of right foot    Poorly controlled diabetes mellitus ([place]) 07/16/2019   FHx: heart disease 10/08/2017   Type 2 diabetes mellitus with stage 2 chronic kidney disease, without long-term current use of insulin ([place]) 10/08/2017   Insomnia 07/08/2017   Smoker 08/17/2015   COPD (chronic obstructive pulmonary disease) ([place]) 08/17/2015   T2_NIDDM w/Peripheral Neuropathy 04/01/2014   CKD stage 2 due to type 2 diabetes mellitus ([place]) 08/31/2013   Medication management 05/19/2013   Morbid obesity (BMI 35) 05/19/2013   Essential hypertension    Hyperlipidemia associated with type 2 diabetes mellitus ([place])    Vitamin D deficiency    History of kidney stones    Testosterone deficiency    History of hepatitis C     ONSET DATE: 05/08/2021  REFERRING DIAG: I63.9 (ICD-10-CM) - Acute CVA (cerebrovascular accident) ([place])   PERTINENT HISTORY: [name] is a 61 y.o. male admitted to hospital 4/18-19/2023 d/t sudden onset of word finding difficulty.  CT head was done which revealed old left MCA territory infarcts.  MRI brain was completed which showed a moderate area of left frontal lobe acute infarction in addition to the chronic infarcts in the left frontoparietal regions. PMHx singnificant for diabetes, hepatitis C, hypertension, hyperlipidemia.   THERAPY  DIAG:  Aphasia  SUBJECTIVE: "great!"    PAIN:  Are you having pain? No  OBJECTIVE:   TODAY'S TREATMENT:  06-08-21: Pt reports unable to complete HEP, did not have time. Reviewed memory and orientation compensations targeted last session, pt endorses understanding, will attempt to implement calendar this weekend. Target expanded verbal expression and organization of thoughts using NARNIA framework. Initial max-A, faded to occasional min-A for narrative story telling with visual framework. Generate examples of when narratives are required, pt with 3 examples. Discuss compensations pt can use to organize thoughts prior to conversations with  writing down thoughts or key points prior to. Verbalizes understanding. Tells ST he is pleased with progress being made in ST.   06-06-21: Pt reports to starting to notice things he's having trouble with. Example given, pt having difficulty orienting to and manipulating time, schedule management challenge. Education on implementation of calendar system to provide visual cue to assist in orientation. Training on morning routine to include referencing calendar and spouse referencing calendar when discussing appointment times/dates. Report decreased motivation and desire to interact with others. Report some R visual neglect. Target spelling d/t continued reports of difficulty in this area. Use of [place] Word List, focus on irregularly spelled words. Initiated training use of Problem Solving Approach to spelling. Education on process of treatment and use of spell check. Pt and spouse report access to iPad to use as spell checker. Completed x11 words this date. Initial spelling accurate 100% of trials. Despite accuracy, cued for self-review following each spelling. Provide additional word list to use for home practice.    PATIENT EDUCATION: Education details: see above Person educated: Patient and Spouse Education method: Explanation, Demonstration, and Handouts Education comprehension: verbalized understanding, returned demonstration, and needs further education  HOME EXERCISE PROGRAM: Problem Solving Approach to Spelling worksheet   GOALS: Goals reviewed with patient? Yes   SHORT TERM GOALS: Target date: 06/08/2021   Pt will complete communication PROM during first session.  Baseline: CPIB=16 Goal status: MET    2.  Pt will successfully complete language tasks (e.g. SFA, VNeST) with 80% accuracy with rare min-A  Baseline: 06-06-2021 Goal status: MET   3.  Pt will use trained anomia strategies when answering mod-complex questions in 80% of trials with occasional min-A over 2 sessions Baseline:  06-06-2021 Goal status: MET   4.  Pt will name 7+ items in personally relevant categories with rare min A over 2 sessions Baseline: 05-16-21, 06-06-2021 Goal status: MET   LONG TERM GOALS: Target date: 07/06/2021   Pt will report completion of aphasia HEP with mod-I over 1 week period Baseline:  Goal status: ongoing   2.  Pt will demonstrate usage of trained anomia strategies in 15+ minute conversation to improve functional communication over 2 sessions Baseline:  Goal status: ongoing   3.  Pt will report improved confidence in communication abilities via PROM by d/c Baseline:  Goal status: ongoing     ASSESSMENT:   CLINICAL IMPRESSION: Patient is a 61 y.o. M who was seen today for aphasia. Pt presents with mild aphasia per QAB administration. Frequent vague speech and word finding difficulties observed in structured tasks and in conversational speech. Reports some improvement since d/c but continues to be concerned about ability to verbally express himself. Impairment in ability to generate grammatically correct sentences in picture description tasks and in some conversational speech. Frequently has to refer to wife to fill in details when providing history and concerns. Additional c/o of saying things "I  don't want to come out." Wife endorses some inappropriate speech but both tell ST pt is aware when this happens. Initiated education and training of anomia strategies to aid word finding on structured tasks and in conversation. I recommend skilled ST to address expressive aphasia.    OBJECTIVE IMPAIRMENTS include aphasia. These impairments are limiting patient from effectively communicating at home and in community. No overt factors affecting potential to achieve goals and functional outcomes. Patient will benefit from skilled SLP services to address above impairments and improve overall function.   REHAB POTENTIAL: Excellent   PLAN: SLP FREQUENCY: 2x/week   SLP DURATION: 8 weeks    PLANNED INTERVENTIONS: Language facilitation, Cueing hierachy, Functional tasks, SLP instruction and feedback, Compensatory strategies, and Patient/family education   Su Monks, CCC-SLP 06/08/2021, 3:52 PM

## 2021-06-08 ENCOUNTER — Ambulatory Visit: Payer: 59 | Admitting: Speech Pathology

## 2021-06-08 DIAGNOSIS — R4701 Aphasia: Secondary | ICD-10-CM

## 2021-06-11 ENCOUNTER — Encounter: Payer: Self-pay | Admitting: Family Medicine

## 2021-06-11 ENCOUNTER — Ambulatory Visit: Payer: 59 | Admitting: Family Medicine

## 2021-06-11 VITALS — BP 118/66 | HR 68 | Ht 75.0 in | Wt 262.0 lb

## 2021-06-11 DIAGNOSIS — I6522 Occlusion and stenosis of left carotid artery: Secondary | ICD-10-CM

## 2021-06-11 DIAGNOSIS — I10 Essential (primary) hypertension: Secondary | ICD-10-CM

## 2021-06-11 DIAGNOSIS — E1169 Type 2 diabetes mellitus with other specified complication: Secondary | ICD-10-CM | POA: Diagnosis not present

## 2021-06-11 DIAGNOSIS — I6330 Cerebral infarction due to thrombosis of unspecified cerebral artery: Secondary | ICD-10-CM

## 2021-06-11 DIAGNOSIS — Z720 Tobacco use: Secondary | ICD-10-CM

## 2021-06-11 DIAGNOSIS — E785 Hyperlipidemia, unspecified: Secondary | ICD-10-CM

## 2021-06-11 NOTE — Patient Instructions (Signed)
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 Continue aspirin 81 mg daily and clopidogrel 75 mg daily  for three months then Plavix alone and rosuvastatin 40mg  daily  for secondary stroke prevention.  Discussed secondary stroke prevention measures and importance of close PCP follow up for aggressive stroke risk factor management. I have gone over the pathophysiology of stroke, warning signs and symptoms, risk factors and their management in some detail with instructions to go to the closest emergency room for symptoms of concern. HTN: BP goal <130/90.  Stable on verapamil 240mg  daily and lisinopril 20mg  daily per PCP. Avoid low BP due to left ICA stenosis.  HLD: LDL goal <70. Recent LDL 23. Continue rosuvastatin 40mg  daily per PCP.  DMII: A1c goal<7.0. Recent A1c 7.5. Continue metformin ER and Trulicity per PCP.  Tobacco abuse: consider smoking cessation  Left ICA stenosis: continue close follow up with PCP PVD s/p R BKA: continue close follow up with PCP  Obesity: healthy lifestyle habits with low carb diet and regular physical exercise. Tobacco abuse: continue cessation THC positive: consider cessation   Please follow up closely with Dr for co morbidity management. Stop aspirin 08/08/2021 and continue Plavix alone   Please make sure you are staying well hydrated. I recommend 50-60 ounces daily. Well balanced diet and regular exercise encouraged. Consistent sleep schedule with 6-8 hours recommended.   Please continue follow up with care team as directed.   Follow up with me in 6 months   You may receive a survey regarding today's visit. I encourage you to leave honest feed back as I do use this information to improve patient care. Thank you for seeing me today!

## 2021-06-13 ENCOUNTER — Ambulatory Visit: Payer: 59 | Admitting: Speech Pathology

## 2021-06-14 ENCOUNTER — Telehealth: Payer: Self-pay | Admitting: Internal Medicine

## 2021-06-14 ENCOUNTER — Ambulatory Visit: Payer: 59 | Admitting: Nurse Practitioner

## 2021-06-14 NOTE — Telephone Encounter (Signed)
LMTCB to see if we can get him in today.

## 2021-06-14 NOTE — Telephone Encounter (Signed)
Wife called today to report;  Patient feels that he broke or injured his 2nd toe on the left foot, when removing his right leg prostetic. Also reports new wound on stump right limb & new pain on top of left foot. Per Rance Muir go to [facility] urgent care to address left toe/foot, and stump wound if unable to be seen urgently by Dr. Cherene Julian. Contact Orthotics provider to report difficulty with fit that is causing wound on right limb stump.

## 2021-06-15 ENCOUNTER — Ambulatory Visit: Payer: 59 | Admitting: Speech Pathology

## 2021-06-15 NOTE — Telephone Encounter (Signed)
Called again, no answer

## 2021-06-15 NOTE — Telephone Encounter (Signed)
Haven't heard back from either him or his wife in regards to an appt.

## 2021-06-19 ENCOUNTER — Ambulatory Visit (INDEPENDENT_AMBULATORY_CARE_PROVIDER_SITE_OTHER): Payer: 59 | Admitting: Nurse Practitioner

## 2021-06-19 ENCOUNTER — Encounter: Payer: Self-pay | Admitting: Nurse Practitioner

## 2021-06-19 VITALS — BP 165/91 | HR 73 | Temp 96.9°F | Wt 266.8 lb

## 2021-06-19 DIAGNOSIS — J449 Chronic obstructive pulmonary disease, unspecified: Secondary | ICD-10-CM

## 2021-06-19 DIAGNOSIS — Z89511 Acquired absence of right leg below knee: Secondary | ICD-10-CM

## 2021-06-19 DIAGNOSIS — Z79899 Other long term (current) drug therapy: Secondary | ICD-10-CM

## 2021-06-19 DIAGNOSIS — E1140 Type 2 diabetes mellitus with diabetic neuropathy, unspecified: Secondary | ICD-10-CM

## 2021-06-19 DIAGNOSIS — I10 Essential (primary) hypertension: Secondary | ICD-10-CM

## 2021-06-19 DIAGNOSIS — I6330 Cerebral infarction due to thrombosis of unspecified cerebral artery: Secondary | ICD-10-CM | POA: Diagnosis not present

## 2021-06-19 DIAGNOSIS — M79674 Pain in right toe(s): Secondary | ICD-10-CM

## 2021-06-19 DIAGNOSIS — R600 Localized edema: Secondary | ICD-10-CM

## 2021-06-19 DIAGNOSIS — L539 Erythematous condition, unspecified: Secondary | ICD-10-CM | POA: Diagnosis not present

## 2021-06-19 DIAGNOSIS — E1149 Type 2 diabetes mellitus with other diabetic neurological complication: Secondary | ICD-10-CM

## 2021-06-19 MED ORDER — SULFAMETHOXAZOLE-TRIMETHOPRIM 800-160 MG PO TABS: 1.0000 | ORAL_TABLET | Freq: Two times a day (BID) | ORAL | 0 refills | Status: AC

## 2021-06-19 NOTE — Progress Notes (Signed)
Assessment and Plan:  [name] was seen today for a follow up.  Diagnoses and all order for this visit:  1. Cerebral thrombosis with cerebral infarction Continue to follow with Neurology Continue [facility] trial as directed. Keep BG and BP well controlled. Continue smoking cessation.  2. Right below-knee amputee ([name]) Follow up with Prosthesis tomorrow for evaluation of abrasion and new fitting of prosthetic.  3. Pain in toe of right foot Start Bactrim for prophylactic treatment. Discussed possible referral to Orthopedics. Keep BG well controlled. Continue to monitor.  - DG Toe 5th Right; Future - sulfamethoxazole-trimethoprim (BACTRIM DS) 800-160 MG tablet; Take 1 tablet by mouth 2 (two) times daily for 7 days.  Dispense: 14 tablet; Refill: 0  4. Erythema RICE Method. Await X-ray results.  - sulfamethoxazole-trimethoprim (BACTRIM DS) 800-160 MG tablet; Take 1 tablet by mouth 2 (two) times daily for 7 days.  Dispense: 14 tablet; Refill: 0  5. Localized edema RICE Method Await X-ray Results  - sulfamethoxazole-trimethoprim (BACTRIM DS) 800-160 MG tablet; Take 1 tablet by mouth 2 (two) times daily for 7 days.  Dispense: 14 tablet; Refill: 0  6. T2_NIDDM w/Peripheral Neuropathy BG have improved.  Continue to aim for 110-120 daily average. Discussed increasing Trulicity from 0.75 mg weekly. Patient requests to discontinue Metformin first. Decrease Metformin from TID to BID.  Continue to monitor.  7. Essential hypertension Controlled. Elevated, in clinic. Keep higher >130/90 for profusion after stroke. Continue to  monitor. Continue to follow with Neurology.  8. Chronic obstructive pulmonary disease, unspecified COPD type ([name]) Continue smoking cessation. Continue to monitor.  9. Medication management Continue all other  medications as directed. Medications discussed and reviewed in detail. All questions and concerns addressed.   Notify office for further  evaluation and treatment, questions or concerns if s/s fail to improve. The risks and benefits of my recommendations, as well as other treatment options were discussed with the patient today. Questions were answered.  Further disposition pending results of labs. Discussed med's effects and SE's.    Over 20 minutes of exam, counseling, chart review, and critical decision making was performed.   Future Appointments  Date Time Provider Department Center  06/20/2021  2:45 PM Kara Mead [facility]  06/22/2021  3:30 PM Gracy Racer, CCC-SLP [facility]  06/27/2021  2:45 PM Maia Breslow, CCC-SLP [facility]  06/29/2021  2:45 PM Zena Amos I, CCC-SLP [facility]  07/19/2021  3:00 PM Lucky Cowboy, MD [facility] None  12/17/2021 10:00 AM Lomax, Amy, NP [facility] None    ------------------------------------------------------------------------------------------------------------------   HPI BP (!) 165/91   Pulse 73   Temp (!) 96.9 F (36.1 C)   Wt 266 lb 12.8 oz (121 kg)   SpO2 95%   BMI 33.35 kg/m   61 y.o.male presents for a one month follow up after hospital discharge for stroke.  He has a past medical history of Diabetic neuropathy ([name]), Diverticulitis, History of hepatitis C, History of kidney stones, Hyperlipidemia, Hypertension, 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[name]), Other testicular hypofunction, Severe sepsis ([name]) (08/26/2019), Type II or unspecified type diabetes mellitus with complication and amputation, currently well controlled, and Vitamin D deficiency.    Patient presented to ED on 05/07/2021 with sudden onset of aphasia. 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 UDS positive for THC. Outside window for tPA. He was started on asa and Plavix for three months then Plavix alone.  He is continuing asa  and Plavix.  He was discharged home 05/09/2021. No recommendations from PT/OT. ST  recommended outpatient therapy.    Since discharge home, he is doing fairly well. He continues to have difficulty finding correct words. He has difficulty with recall. He has difficulty with day/time orientation. He has not returned to work. He continues to [place] with ST twice weekly. He has some difficulty with irritability and frustration. Currently on citalopram. Feels is effective, does not want to change dose.  He has had first initial Neurological visit 06/11/21.  He does not have to f/u for 6 mo.  No new or additional changes during the Neuro follow up. He continues the [employer] trial.  He is no longer smoking cigarettes.  He continues to smoke marijuana.  1 joint daily.  He is concerned today for a newly formed abrasion on his right stump.  He has been having problems with his prothesis fitting properly which has caused a sore.  He has corrected his balance with the left foot, to which now he feels he has injured his left pinky toe.  He tried maneuvering the prothesis with his left foot and felt as though he dislocated the left pinky toe.  He reports "popping" it back into place.  The toe is now swollen and red.  He denies pain.  He has a hx of neuropathy.  The incident occurred 1 week ago.  He has followed with Dr. Lajoyce Corners, Orthopedics in the past.   He is continuing to keep BG well controlled.  BG have averaged between 110-120.  Past Medical History:  Diagnosis Date   Diabetic neuropathy ([name])    Diverticulitis    History of hepatitis C    has been treated in the past   History of kidney stones    Hyperlipidemia    Hypertension    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[name])    Other testicular hypofunction    Severe sepsis ([name]) 08/26/2019   Type II or unspecified type diabetes mellitus without mention of complication, not stated as uncontrolled    Vitamin D deficiency      Allergies  Allergen Reactions   Invokamet [Canagliflozin-Metformin Hcl] Other (See Comments)    Extremity  edema/caused pain   Invokana [Canagliflozin] Other (See Comments)    Extremity edema/caused pain   Codeine Camsylate [Codeine] Rash   Morphine And Related Rash    Current Outpatient Medications on File Prior to Visit  Medication Sig   aspirin 81 MG tablet Take 1 tablet (81 mg total) by mouth daily. Take along with plavix for 3 months, after 3 months stop aspirin and continue plavix   cetirizine (ZYRTEC) 10 MG tablet Take 10 mg by mouth daily as needed for allergies.   Cholecalciferol (VITAMIN D) 50 MCG (2000 UT) CAPS Take 2,000 Units by mouth 2 (two) times daily.   citalopram (CELEXA) 40 MG tablet Take 1 tablet Daily for Mood & Chronic Anxiety   clopidogrel (PLAVIX) 75 MG tablet Take 1 tablet (75 mg total) by mouth daily.   Dulaglutide (TRULICITY) 0.75 MG/0.5ML SOPN Inject 0.75 mg into the skin once a week. (Patient taking differently: Inject 0.75 mg into the skin once a week. Friday)   hydrochlorothiazide (HYDRODIURIL) 25 MG tablet Take  1 tablet  Daily  for BP & Fluid Retention / Ankle Swelling (Patient taking differently: Take 12.5 mg by mouth daily. for BP & Fluid Retention / Ankle Swelling)   lisinopril (ZESTRIL) 20 MG tablet Take 1 tablet Daily for BP &  Kidney Protection   metFORMIN (GLUCOPHAGE-XR) 500 MG 24 hr tablet Take 2 tablets 2 x /day with Meals  for Diabetes (Patient taking differently: 1,000 mg 2 (two) times daily. Take 1 tablet in the morning and 2 in the evening)   pantoprazole (PROTONIX) 40 MG tablet Take 1 tablet (40 mg total) by mouth daily.   rosuvastatin (CRESTOR) 40 MG tablet Take one tablet daily for Cholesterol. (Patient taking differently: Take 40 mg by mouth daily. Take one tablet daily for Cholesterol.)   Study - OCEANIC-STROKE - asundexian 50 mg or placebo tablet (PI-Sethi) Take 1 tablet (50 mg total) by mouth daily. For investigational use only. Take 1 tablet by mouth once daily at the same time each day, preferably in the morning. Please bring bottle to every visit.    verapamil (CALAN-SR) 240 MG CR tablet Take 1 tablet Daily for BP & Heart Rhythm                                /                         TAKE                              BY                       MOUTH                  ONCE DAILY   No current facility-administered medications on file prior to visit.    ROS: all negative except what is noted in the HPI.   Physical Exam:  BP (!) 165/91   Pulse 73   Temp (!) 96.9 F (36.1 C)   Wt 266 lb 12.8 oz (121 kg)   SpO2 95%   BMI 33.35 kg/m   General Appearance: NAD.  Awake, conversant and cooperative. Eyes: PERRLA, EOMs intact.  Sclera white.  Conjunctiva without erythema. Sinuses: No frontal/maxillary tenderness.  No nasal discharge. Nares patent.  ENT/Mouth: Ext aud canals clear.  Bilateral TMs w/DOL and without erythema or bulging. Hearing intact.  Posterior pharynx without swelling or exudate.  Tonsils without swelling or erythema.  Neck: Supple.  No masses, nodules or thyromegaly. Respiratory: Effort is regular with non-labored breathing. Breath sounds are equal bilaterally without rales, rhonchi, wheezing or stridor.  Cardio: RRR with no MRGs. Brisk peripheral pulses without edema.  Abdomen: Active BS in all four quadrants.  Soft and non-tender without guarding, rebound tenderness, hernias or masses. Lymphatics: Non tender without lymphadenopathy.  Musculoskeletal:  Left 5th toe with moderate edema, moderate erythema. No pain.    Skin: Left BKA stump with round circular bilateral type abrasion medial side.  Surrounding skin wnl and appropriate color for ethnicity.  Neuro: CN II-XII grossly normal. Normal muscle tone without cerebellar symptoms and intact sensation.   Psych: AO X 3,  appropriate mood and affect, insight and judgment.     Adela GlimpseNYA Matthew Pais, NP 10:00 PM [facility]

## 2021-06-20 ENCOUNTER — Ambulatory Visit
Admission: RE | Admit: 2021-06-20 | Discharge: 2021-06-20 | Disposition: A | Discharge status: Home / Self Care | Payer: 59 | Source: Ambulatory Visit | Attending: Nurse Practitioner | Admitting: Nurse Practitioner

## 2021-06-20 ENCOUNTER — Other Ambulatory Visit: Payer: Self-pay | Admitting: Nurse Practitioner

## 2021-06-20 ENCOUNTER — Ambulatory Visit: Payer: 59 | Admitting: Speech Pathology

## 2021-06-20 DIAGNOSIS — R4701 Aphasia: Secondary | ICD-10-CM | POA: Diagnosis not present

## 2021-06-20 DIAGNOSIS — M79674 Pain in right toe(s): Secondary | ICD-10-CM

## 2021-06-20 NOTE — Therapy (Signed)
OUTPATIENT SPEECH LANGUAGE PATHOLOGY TREATMENT NOTE   Patient Name: [name] MRN: [id number] DOB:Dec 07, 1960, 61 y.o., male Today's Date: 06/20/2021  PCP: [name], MD REFERRING PROVIDER: [name], MD  END OF SESSION:   End of Session - 06/20/21 1450     Visit Number 8    Number of Visits 17    Date for SLP Re-Evaluation 07/06/21    Authorization Type U[employer]    SLP Start Time 1450    SLP Stop Time  1530    SLP Time Calculation (min) 40 min    Activity Tolerance Patient tolerated treatment well                   Past Medical History:  Diagnosis Date   Diabetic neuropathy ([place])    Diverticulitis    History of hepatitis C    has been treated in the past   History of kidney stones    Hyperlipidemia    Hypertension    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[place])    Other testicular hypofunction    Severe sepsis ([place]) 08/26/2019   Type II or unspecified type diabetes mellitus without mention of complication, not stated as uncontrolled    Vitamin D deficiency    Past Surgical History:  Procedure Laterality Date   AMPUTATION Right 09/01/2019   Procedure: RIGHT BELOW KNEE AMPUTATION;  Surgeon: Newt Minion, MD;  Location: [place];  Service: Orthopedics;  Laterality: Right;   CHOLECYSTECTOMY  1987   COLONOSCOPY     I & D EXTREMITY Right 08/20/2019   Procedure: IRRIGATION & DEBRIDEMENT RIGHT FOOT PARTIAL CALCANEAL EXCISION;  Surgeon: Newt Minion, MD;  Location: [place];  Service: Orthopedics;  Laterality: Right;   I & D EXTREMITY Right 08/27/2019   Procedure: IRRIGATION AND DEBRIDEMENT  OF FOOT;  Surgeon: Leandrew Koyanagi, MD;  Location: [place];  Service: Orthopedics;  Laterality: Right;   ORIF TIBIA FRACTURE     x 3 right leg   Patient Active Problem List   Diagnosis Date Noted   Cerebral thrombosis with cerebral infarction 05/09/2021   Aphasia 05/08/2021   Leukocytosis 05/08/2021   Right below-knee amputee ([place]) 09/03/2019   Hyponatremia 08/26/2019    Cutaneous abscess of right foot    Poorly controlled diabetes mellitus ([place]) 07/16/2019   FHx: heart disease 10/08/2017   Type 2 diabetes mellitus with stage 2 chronic kidney disease, without long-term current use of insulin ([place]) 10/08/2017   Insomnia 07/08/2017   Smoker 08/17/2015   COPD (chronic obstructive pulmonary disease) ([place]) 08/17/2015   T2_NIDDM w/Peripheral Neuropathy 04/01/2014   CKD stage 2 due to type 2 diabetes mellitus ([place]) 08/31/2013   Medication management 05/19/2013   Morbid obesity (BMI 35) 05/19/2013   Essential hypertension    Hyperlipidemia associated with type 2 diabetes mellitus ([place])    Vitamin D deficiency    History of kidney stones    Testosterone deficiency    History of hepatitis C     ONSET DATE: 05/08/2021  REFERRING DIAG: I63.9 (ICD-10-CM) - Acute CVA (cerebrovascular accident) ([place])   PERTINENT HISTORY: Michael BOCCIO is a 61 y.o. male admitted to hospital 4/18-19/2023 d/t sudden onset of word finding difficulty.  CT head was done which revealed old left MCA territory infarcts.  MRI brain was completed which showed a moderate area of left frontal lobe acute infarction in addition to the chronic infarcts in the left frontoparietal regions. PMHx singnificant for diabetes, hepatitis C, hypertension, hyperlipidemia.  THERAPY DIAG:  Aphasia  SUBJECTIVE: "much better"   PAIN:  Are you having pain? No  OBJECTIVE:   TODAY'S TREATMENT:  06-19-21: Pt with increased verbalizations this date, tells ST stories about what he's been up to. Stories notable for completeness, good organization, having relevant details. Some halting exhibited but overall repaired for functional communication. Target reading, as this is new concern brought up by patient. Education on reading to verbalization process and why breakdown may be occurring. Encouraged pt to implement HEP to address reading and gave hierarchical ideas to work at appropriate level based on performance. Pt  to try and return Friday with report. Provided additional spelling list for continued use of problem solving approach to spelling practice. 18/20 accuracy in completed responses, error'd responses noted to be phonetically spelled.   06-08-21: Pt reports unable to complete HEP, did not have time. Reviewed memory and orientation compensations targeted last session, pt endorses understanding, will attempt to implement calendar this weekend. Target expanded verbal expression and organization of thoughts using NARNIA framework. Initial max-A, faded to occasional min-A for narrative story telling with visual framework. Generate examples of when narratives are required, pt with 3 examples. Discuss compensations pt can use to organize thoughts prior to conversations with writing down thoughts or key points prior to. Verbalizes understanding. Tells ST he is pleased with progress being made in ST.   06-06-21: Pt reports to starting to notice things he's having trouble with. Example given, pt having difficulty orienting to and manipulating time, schedule management challenge. Education on implementation of calendar system to provide visual cue to assist in orientation. Training on morning routine to include referencing calendar and spouse referencing calendar when discussing appointment times/dates. Report decreased motivation and desire to interact with others. Report some R visual neglect. Target spelling d/t continued reports of difficulty in this area. Use of [employer] Word List, focus on irregularly spelled words. Initiated training use of Problem Solving Approach to spelling. Education on process of treatment and use of spell check. Pt and spouse report access to iPad to use as spell checker. Completed x11 words this date. Initial spelling accurate 100% of trials. Despite accuracy, cued for self-review following each spelling. Provide additional word list to use for home practice.    PATIENT EDUCATION: Education  details: see above Person educated: Patient and Spouse Education method: Explanation, Demonstration, and Handouts Education comprehension: verbalized understanding, returned demonstration, and needs further education  HOME EXERCISE PROGRAM: Problem Solving Approach to Spelling worksheet   GOALS: Goals reviewed with patient? Yes   SHORT TERM GOALS: Target date: 06/08/2021   Pt will complete communication PROM during first session.  Baseline: CPIB=16 Goal status: MET    2.  Pt will successfully complete language tasks (e.g. SFA, VNeST) with 80% accuracy with rare min-A  Baseline: 06-06-2021 Goal status: MET   3.  Pt will use trained anomia strategies when answering mod-complex questions in 80% of trials with occasional min-A over 2 sessions Baseline: 06-06-2021 Goal status: MET   4.  Pt will name 7+ items in personally relevant categories with rare min A over 2 sessions Baseline: 05-16-21, 06-06-2021 Goal status: MET   LONG TERM GOALS: Target date: 07/06/2021   Pt will report completion of aphasia HEP with mod-I over 1 week period Baseline:  Goal status: ongoing   2.  Pt will demonstrate usage of trained anomia strategies in 15+ minute conversation to improve functional communication over 2 sessions Baseline:  Goal status: ongoing   3.  Pt  will report improved confidence in communication abilities via PROM by d/c Baseline:  Goal status: ongoing     ASSESSMENT:   CLINICAL IMPRESSION: Patient is a 61 y.o. M who was seen today for aphasia. Pt presents with mild aphasia per QAB administration. Frequent vague speech and word finding difficulties observed in structured tasks and in conversational speech. Reports some improvement since d/c but continues to be concerned about ability to verbally express himself. Impairment in ability to generate grammatically correct sentences in picture description tasks and in some conversational speech. Frequently has to refer to wife to fill in  details when providing history and concerns. Additional c/o of saying things "I don't want to come out." Wife endorses some inappropriate speech but both tell ST pt is aware when this happens. Initiated education and training of anomia strategies to aid word finding on structured tasks and in conversation. I recommend skilled ST to address expressive aphasia.    OBJECTIVE IMPAIRMENTS include aphasia. These impairments are limiting patient from effectively communicating at home and in community. No overt factors affecting potential to achieve goals and functional outcomes. Patient will benefit from skilled SLP services to address above impairments and improve overall function.   REHAB POTENTIAL: Excellent   PLAN: SLP FREQUENCY: 2x/week   SLP DURATION: 8 weeks   PLANNED INTERVENTIONS: Language facilitation, Cueing hierachy, Functional tasks, SLP instruction and feedback, Compensatory strategies, and Patient/family education   Su Monks, CCC-SLP 06/20/2021, 2:51 PM

## 2021-06-20 NOTE — Patient Instructions (Signed)
Reading Rehab  Choose a short article regarding any topic of interest.  Try reading aloud, use a bookmark to keep eye trained on where you are and limit visual field  If you have trouble, use Speechify App to read the article   Have it read a sentence and then you read. Continue on to complete the article.

## 2021-06-22 ENCOUNTER — Ambulatory Visit: Payer: BC Managed Care – PPO | Attending: Internal Medicine | Admitting: Speech Pathology

## 2021-06-22 DIAGNOSIS — R4701 Aphasia: Secondary | ICD-10-CM | POA: Diagnosis not present

## 2021-06-22 DIAGNOSIS — R41841 Cognitive communication deficit: Secondary | ICD-10-CM | POA: Diagnosis not present

## 2021-06-22 NOTE — Therapy (Signed)
OUTPATIENT SPEECH LANGUAGE PATHOLOGY TREATMENT NOTE   Patient Name: OSAMAH SCHMADER MRN: [id number] DOB:06/27/1960, 61 y.o., male Today's Date: 06/22/2021  PCP: [name], MD REFERRING PROVIDER: [name], MD  END OF SESSION:   End of Session - 06/22/21 1533     Visit Number 9    Number of Visits 17    Date for SLP Re-Evaluation 07/06/21    Authorization Type U[employer]    SLP Start Time 1534    SLP Stop Time  1610    SLP Time Calculation (min) 36 min    Activity Tolerance Patient tolerated treatment well                   Past Medical History:  Diagnosis Date   Diabetic neuropathy ([place])    Diverticulitis    History of hepatitis C    has been treated in the past   History of kidney stones    Hyperlipidemia    Hypertension    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[place])    Other testicular hypofunction    Severe sepsis ([place]) 08/26/2019   Type II or unspecified type diabetes mellitus without mention of complication, not stated as uncontrolled    Vitamin D deficiency    Past Surgical History:  Procedure Laterality Date   AMPUTATION Right 09/01/2019   Procedure: RIGHT BELOW KNEE AMPUTATION;  Surgeon: Newt Minion, MD;  Location: [facility];  Service: Orthopedics;  Laterality: Right;   CHOLECYSTECTOMY  1987   COLONOSCOPY     I & D EXTREMITY Right 08/20/2019   Procedure: IRRIGATION & DEBRIDEMENT RIGHT FOOT PARTIAL CALCANEAL EXCISION;  Surgeon: Newt Minion, MD;  Location: [facility];  Service: Orthopedics;  Laterality: Right;   I & D EXTREMITY Right 08/27/2019   Procedure: IRRIGATION AND DEBRIDEMENT  OF FOOT;  Surgeon: Leandrew Koyanagi, MD;  Location: [facility];  Service: Orthopedics;  Laterality: Right;   ORIF TIBIA FRACTURE     x 3 right leg   Patient Active Problem List   Diagnosis Date Noted   Cerebral thrombosis with cerebral infarction 05/09/2021   Aphasia 05/08/2021   Leukocytosis 05/08/2021   Right below-knee amputee ([place]) 09/03/2019   Hyponatremia 08/26/2019    Cutaneous abscess of right foot    Poorly controlled diabetes mellitus ([place]) 07/16/2019   FHx: heart disease 10/08/2017   Type 2 diabetes mellitus with stage 2 chronic kidney disease, without long-term current use of insulin ([place]) 10/08/2017   Insomnia 07/08/2017   Smoker 08/17/2015   COPD (chronic obstructive pulmonary disease) ([place]) 08/17/2015   T2_NIDDM w/Peripheral Neuropathy 04/01/2014   CKD stage 2 due to type 2 diabetes mellitus ([place]) 08/31/2013   Medication management 05/19/2013   Morbid obesity (BMI 35) 05/19/2013   Essential hypertension    Hyperlipidemia associated with type 2 diabetes mellitus ([place])    Vitamin D deficiency    History of kidney stones    Testosterone deficiency    History of hepatitis C     ONSET DATE: 05/08/2021  REFERRING DIAG: I63.9 (ICD-10-CM) - Acute CVA (cerebrovascular accident) ([place])   PERTINENT HISTORY: HUMZA TALLERICO is a 61 y.o. male admitted to hospital 4/18-19/2023 d/t sudden onset of word finding difficulty.  CT head was done which revealed old left MCA territory infarcts.  MRI brain was completed which showed a moderate area of left frontal lobe acute infarction in addition to the chronic infarcts in the left frontoparietal regions. PMHx singnificant for diabetes, hepatitis C, hypertension, hyperlipidemia.  THERAPY DIAG:  Aphasia  SUBJECTIVE: "finally Friday"   PAIN:  Are you having pain? No  OBJECTIVE:   TODAY'S TREATMENT:  06-22-21: Education on physical exercise benefits on brain health. Education on home practice for reading and spelling. Provided recommendations for expanding communication opportunities, to which pt is reserved but agreeable. Pt tells ST he feels like he is making small improvements day by day and continues to be pleased with his progress in speech therapy. Target receptive and verbal language expression through verbally interpreting abstract phrases and sentences. 11/15 accuracy.    06-19-21: Pt with increased  verbalizations this date, tells ST stories about what he's been up to. Stories notable for completeness, good organization, having relevant details. Some halting exhibited but overall repaired for functional communication. Target reading, as this is new concern brought up by patient. Education on reading to verbalization process and why breakdown may be occurring. Encouraged pt to implement HEP to address reading and gave hierarchical ideas to work at appropriate level based on performance. Pt to try and return Friday with report. Provided additional spelling list for continued use of problem solving approach to spelling practice. 18/20 accuracy in completed responses, error'd responses noted to be phonetically spelled.   06-08-21: Pt reports unable to complete HEP, did not have time. Reviewed memory and orientation compensations targeted last session, pt endorses understanding, will attempt to implement calendar this weekend. Target expanded verbal expression and organization of thoughts using NARNIA framework. Initial max-A, faded to occasional min-A for narrative story telling with visual framework. Generate examples of when narratives are required, pt with 3 examples. Discuss compensations pt can use to organize thoughts prior to conversations with writing down thoughts or key points prior to. Verbalizes understanding. Tells ST he is pleased with progress being made in ST.   06-06-21: Pt reports to starting to notice things he's having trouble with. Example given, pt having difficulty orienting to and manipulating time, schedule management challenge. Education on implementation of calendar system to provide visual cue to assist in orientation. Training on morning routine to include referencing calendar and spouse referencing calendar when discussing appointment times/dates. Report decreased motivation and desire to interact with others. Report some R visual neglect. Target spelling d/t continued reports of  difficulty in this area. Use of [employer]'s Word List, focus on irregularly spelled words. Initiated training use of Problem Solving Approach to spelling. Education on process of treatment and use of spell check. Pt and spouse report access to iPad to use as spell checker. Completed x11 words this date. Initial spelling accurate 100% of trials. Despite accuracy, cued for self-review following each spelling. Provide additional word list to use for home practice.    PATIENT EDUCATION: Education details: see above Person educated: Patient and Spouse Education method: Explanation, Demonstration, and Handouts Education comprehension: verbalized understanding, returned demonstration, and needs further education  HOME EXERCISE PROGRAM: Problem Solving Approach to Spelling worksheet   GOALS: Goals reviewed with patient? Yes   SHORT TERM GOALS: Target date: 06/08/2021   Pt will complete communication PROM during first session.  Baseline: CPIB=16 Goal status: MET    2.  Pt will successfully complete language tasks (e.g. SFA, VNeST) with 80% accuracy with rare min-A  Baseline: 06-06-2021 Goal status: MET   3.  Pt will use trained anomia strategies when answering mod-complex questions in 80% of trials with occasional min-A over 2 sessions Baseline: 06-06-2021 Goal status: MET   4.  Pt will name 7+ items in personally relevant categories with  rare min A over 2 sessions Baseline: 05-16-21, 06-06-2021 Goal status: MET   LONG TERM GOALS: Target date: 07/06/2021   Pt will report completion of aphasia HEP with mod-I over 1 week period Baseline:  Goal status: ongoing   2.  Pt will demonstrate usage of trained anomia strategies in 15+ minute conversation to improve functional communication over 2 sessions Baseline:  Goal status: ongoing   3.  Pt will report improved confidence in communication abilities via PROM by d/c Baseline:  Goal status: ongoing     ASSESSMENT:   CLINICAL IMPRESSION: Patient  is a 61 y.o. M who was seen today for aphasia. Pt presents with mild aphasia per QAB administration. Frequent vague speech and word finding difficulties observed in structured tasks and in conversational speech. Reports some improvement since d/c but continues to be concerned about ability to verbally express himself. Impairment in ability to generate grammatically correct sentences in picture description tasks and in some conversational speech. Frequently has to refer to wife to fill in details when providing history and concerns. Additional c/o of saying things "I don't want to come out." Wife endorses some inappropriate speech but both tell ST pt is aware when this happens. Initiated education and training of anomia strategies to aid word finding on structured tasks and in conversation. I recommend skilled ST to address expressive aphasia.    OBJECTIVE IMPAIRMENTS include aphasia. These impairments are limiting patient from effectively communicating at home and in community. No overt factors affecting potential to achieve goals and functional outcomes. Patient will benefit from skilled SLP services to address above impairments and improve overall function.   REHAB POTENTIAL: Excellent   PLAN: SLP FREQUENCY: 2x/week   SLP DURATION: 8 weeks   PLANNED INTERVENTIONS: Language facilitation, Cueing hierachy, Functional tasks, SLP instruction and feedback, Compensatory strategies, and Patient/family education   Su Monks, CCC-SLP 06/22/2021, 4:12 PM

## 2021-06-27 ENCOUNTER — Other Ambulatory Visit: Payer: Self-pay | Admitting: *Deleted

## 2021-06-27 ENCOUNTER — Ambulatory Visit: Payer: BC Managed Care – PPO | Admitting: Speech Pathology

## 2021-06-27 ENCOUNTER — Emergency Department (HOSPITAL_COMMUNITY): Payer: BC Managed Care – PPO

## 2021-06-27 ENCOUNTER — Encounter (HOSPITAL_COMMUNITY): Payer: Self-pay | Admitting: Emergency Medicine

## 2021-06-27 ENCOUNTER — Emergency Department (HOSPITAL_COMMUNITY)
Admission: EM | Admit: 2021-06-27 | Discharge: 2021-06-27 | Disposition: A | Discharge status: Home / Self Care | Payer: BC Managed Care – PPO | Attending: Student | Admitting: Student

## 2021-06-27 DIAGNOSIS — Z7982 Long term (current) use of aspirin: Secondary | ICD-10-CM | POA: Diagnosis not present

## 2021-06-27 DIAGNOSIS — Z79899 Other long term (current) drug therapy: Secondary | ICD-10-CM | POA: Insufficient documentation

## 2021-06-27 DIAGNOSIS — R6889 Other general symptoms and signs: Secondary | ICD-10-CM | POA: Diagnosis not present

## 2021-06-27 DIAGNOSIS — F17200 Nicotine dependence, unspecified, uncomplicated: Secondary | ICD-10-CM | POA: Diagnosis not present

## 2021-06-27 DIAGNOSIS — R4701 Aphasia: Secondary | ICD-10-CM

## 2021-06-27 DIAGNOSIS — R0602 Shortness of breath: Secondary | ICD-10-CM | POA: Diagnosis not present

## 2021-06-27 DIAGNOSIS — R42 Dizziness and giddiness: Secondary | ICD-10-CM | POA: Diagnosis not present

## 2021-06-27 DIAGNOSIS — I10 Essential (primary) hypertension: Secondary | ICD-10-CM | POA: Diagnosis not present

## 2021-06-27 DIAGNOSIS — R188 Other ascites: Secondary | ICD-10-CM | POA: Diagnosis not present

## 2021-06-27 DIAGNOSIS — Z7984 Long term (current) use of oral hypoglycemic drugs: Secondary | ICD-10-CM | POA: Diagnosis not present

## 2021-06-27 DIAGNOSIS — E1140 Type 2 diabetes mellitus with diabetic neuropathy, unspecified: Secondary | ICD-10-CM | POA: Diagnosis not present

## 2021-06-27 DIAGNOSIS — I951 Orthostatic hypotension: Secondary | ICD-10-CM | POA: Diagnosis not present

## 2021-06-27 DIAGNOSIS — R0989 Other specified symptoms and signs involving the circulatory and respiratory systems: Secondary | ICD-10-CM | POA: Diagnosis not present

## 2021-06-27 DIAGNOSIS — Z7902 Long term (current) use of antithrombotics/antiplatelets: Secondary | ICD-10-CM | POA: Insufficient documentation

## 2021-06-27 DIAGNOSIS — Z743 Need for continuous supervision: Secondary | ICD-10-CM | POA: Diagnosis not present

## 2021-06-27 DIAGNOSIS — R944 Abnormal results of kidney function studies: Secondary | ICD-10-CM | POA: Insufficient documentation

## 2021-06-27 DIAGNOSIS — R404 Transient alteration of awareness: Secondary | ICD-10-CM | POA: Diagnosis not present

## 2021-06-27 LAB — CBC
HCT: 36.8 % — ABNORMAL LOW (ref 39.0–52.0)
Hemoglobin: 12.2 g/dL — ABNORMAL LOW (ref 13.0–17.0)
MCH: 32.7 pg (ref 26.0–34.0)
MCHC: 33.2 g/dL (ref 30.0–36.0)
MCV: 98.7 fL (ref 80.0–100.0)
Platelets: 182 10*3/uL (ref 150–400)
RBC: 3.73 MIL/uL — ABNORMAL LOW (ref 4.22–5.81)
RDW: 14 % (ref 11.5–15.5)
WBC: 8.8 10*3/uL (ref 4.0–10.5)
nRBC: 0 % (ref 0.0–0.2)

## 2021-06-27 LAB — BASIC METABOLIC PANEL
Anion gap: 9 (ref 5–15)
BUN: 44 mg/dL — ABNORMAL HIGH (ref 8–23)
CO2: 20 mmol/L — ABNORMAL LOW (ref 22–32)
Calcium: 8.9 mg/dL (ref 8.9–10.3)
Chloride: 106 mmol/L (ref 98–111)
Creatinine, Ser: 1.69 mg/dL — ABNORMAL HIGH (ref 0.61–1.24)
GFR, Estimated: 45 mL/min — ABNORMAL LOW (ref >60)
Glucose, Bld: 118 mg/dL — ABNORMAL HIGH (ref 70–99)
Potassium: 5.2 mmol/L — ABNORMAL HIGH (ref 3.5–5.1)
Sodium: 135 mmol/L (ref 135–145)

## 2021-06-27 LAB — TROPONIN I (HIGH SENSITIVITY): Troponin I (High Sensitivity): 7 ng/L (ref <18)

## 2021-06-27 LAB — BRAIN NATRIURETIC PEPTIDE: B Natriuretic Peptide: 28 pg/mL (ref 0.0–100.0)

## 2021-06-27 MED ORDER — LACTATED RINGERS IV BOLUS
1000.0000 mL | Freq: Once | INTRAVENOUS | Status: AC
  Administered 2021-06-27: 1000 mL via INTRAVENOUS

## 2021-06-27 NOTE — ED Triage Notes (Signed)
Pt here from rehab from a stroke , c/o dizziness. orthostatic at the office , received 300 of fluid from ems

## 2021-06-27 NOTE — ED Provider Notes (Signed)
[facility] EMERGENCY DEPARTMENT Provider Note   CSN: [id number] Arrival date & time: 06/27/21  1551     History No chief complaint on file.   [name] is a 61 y.o. 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  Patient presents to the ED for evaluation of orthostasis.  Patient wife present at bedside.  Patient and patient wife states that patient was at speech therapy earlier today around 3 PM.  The patient states that he had gone from a sitting to standing position when he had sudden onset dizziness, feeling like he was going to "fall out".  Patient and patient wife states that at this time the patient was lowered to the ground, he did not hit his head, he did not lose consciousness.  The patient currently takes clopidogrel however the patient and his wife both deny any kind of head trauma occurred.  Patient states that for the last week he has not been hydrating himself properly.  The patient adds that for the last 1 week he has been experiencing productive cough that is clear in color.  Patient states that he recently quit smoking on 4/17. Patient denies any weakness, fever, body aches or chills, chest pain, shortness of breath, abdominal pain, nausea, vomiting.  HPI     Home Medications Prior to Admission medications   Medication Sig Start Date End Date Taking? Authorizing Provider  aspirin 81 MG tablet Take 1 tablet (81 mg total) by mouth daily. Take along with plavix for 3 months, after 3 months stop aspirin and continue plavix 05/09/21   Ghimire, Werner Lean, MD  cetirizine (ZYRTEC) 10 MG tablet Take 10 mg by mouth daily as needed for allergies.    [provider]  Cholecalciferol (VITAMIN D) 50 MCG (2000 UT) CAPS Take 2,000 Units by mouth 2 (two) times daily.    [provider]  citalopram (CELEXA) 40 MG tablet Take 1 tablet Daily for Mood & Chronic  Anxiety 01/29/21   Lucky Cowboy, MD  clopidogrel (PLAVIX) 75 MG tablet Take 1 tablet (75 mg total) by mouth daily. 05/10/21   Ghimire, Werner Lean, MD  Dulaglutide (TRULICITY) 0.75 MG/0.5ML SOPN Inject 0.75 mg into the skin once a week. Patient taking differently: Inject 0.75 mg into the skin once a week. Friday 04/18/21   Raynelle Dick, NP  hydrochlorothiazide (HYDRODIURIL) 25 MG tablet Take  1 tablet  Daily  for BP & Fluid Retention / Ankle Swelling Patient taking differently: Take 12.5 mg by mouth daily. for BP & Fluid Retention / Ankle Swelling 09/16/20   Lucky Cowboy, MD  lisinopril (ZESTRIL) 20 MG tablet Take 1 tablet Daily for BP & Kidney Protection 09/16/20   Lucky Cowboy, MD  metFORMIN (GLUCOPHAGE-XR) 500 MG 24 hr tablet Take 2 tablets 2 x /day with Meals  for Diabetes Patient taking differently: 1,000 mg 2 (two) times daily. Take 1 tablet in the morning and 2 in the evening 01/29/21   Lucky Cowboy, MD  pantoprazole (PROTONIX) 40 MG tablet Take 1 tablet (40 mg total) by mouth daily. 05/09/21 05/09/22  Ghimire, Werner Lean, MD  rosuvastatin (CRESTOR) 40 MG tablet Take one tablet daily for Cholesterol. Patient taking differently: Take 40 mg by mouth daily. Take one tablet daily for Cholesterol. 01/29/21   Lucky Cowboy, MD  Study - OCEANIC-STROKE - asundexian 50 mg or placebo tablet (PI-Sethi) Take 1 tablet (50 mg total)  by mouth daily. For investigational use only. Take 1 tablet by mouth once daily at the same time each day, preferably in the morning. Please bring bottle to every visit. 05/09/21   Ghimire, Werner LeanShanker M, MD  verapamil (CALAN-SR) 240 MG CR tablet Take 1 tablet Daily for BP & Heart Rhythm                                /                         TAKE                              BY                       MOUTH                  ONCE DAILY 05/20/21   Lucky CowboyMcKeown, William, MD      Allergies    Invokamet [canagliflozin-metformin hcl], Invokana [canagliflozin], Codeine camsylate  [codeine], and Morphine and related    Review of Systems   Review of Systems  Constitutional:  Negative for chills and fever.  Respiratory:  Negative for shortness of breath and wheezing.   Cardiovascular:  Negative for chest pain.  Gastrointestinal:  Negative for abdominal pain, nausea and vomiting.  Neurological:  Positive for dizziness and light-headedness. Negative for weakness.  All other systems reviewed and are negative.  Physical Exam Updated Vital Signs BP (!) 108/54   Pulse 60   Resp 19   SpO2 92%  Physical Exam Vitals and nursing note reviewed.  Constitutional:      General: He is not in acute distress.    Appearance: Normal appearance. He is not ill-appearing, toxic-appearing or diaphoretic.  HENT:     Head: Normocephalic and atraumatic.     Nose: Nose normal. No congestion.     Mouth/Throat:     Mouth: Mucous membranes are moist.     Pharynx: Oropharynx is clear.  Eyes:     Extraocular Movements: Extraocular movements intact.     Conjunctiva/sclera: Conjunctivae normal.     Pupils: Pupils are equal, round, and reactive to light.  Cardiovascular:     Rate and Rhythm: Normal rate and regular rhythm.  Pulmonary:     Effort: Pulmonary effort is normal.     Breath sounds: Rhonchi present. No wheezing.  Abdominal:     General: Abdomen is flat. Bowel sounds are normal. There is distension.     Palpations: Abdomen is soft.     Tenderness: There is no abdominal tenderness.  Musculoskeletal:     Cervical back: Normal range of motion and neck supple. No tenderness.     Right lower leg: No edema.     Left lower leg: No edema.     Right Lower Extremity: Right leg is amputated below knee.  Skin:    General: Skin is warm and dry.     Capillary Refill: Capillary refill takes less than 2 seconds.  Neurological:     General: No focal deficit present.     Mental Status: He is alert and oriented to person, place, and time.     GCS: GCS eye subscore is 4. GCS verbal  subscore is 5. GCS motor subscore is 6.     Cranial Nerves:  Cranial nerves 2-12 are intact. No cranial nerve deficit.     Sensory: Sensation is intact. No sensory deficit.     Motor: Motor function is intact. No weakness.    ED Results / Procedures / Treatments   Labs (all labs ordered are listed, but only abnormal results are displayed) Labs Reviewed  CBC - Abnormal; Notable for the following components:      Result Value   RBC 3.73 (*)    Hemoglobin 12.2 (*)    HCT 36.8 (*)    All other components within normal limits  BASIC METABOLIC PANEL - Abnormal; Notable for the following components:   Potassium 5.2 (*)    CO2 20 (*)    Glucose, Bld 118 (*)    BUN 44 (*)    Creatinine, Ser 1.69 (*)    GFR, Estimated 45 (*)    All other components within normal limits  BRAIN NATRIURETIC PEPTIDE  TROPONIN I (HIGH SENSITIVITY)  TROPONIN I (HIGH SENSITIVITY)    EKG None  Radiology DG Chest 2 View  Result Date: 06/27/2021 CLINICAL DATA:  Shortness of breath.  Rhonchi. EXAM: CHEST - 2 VIEW COMPARISON:  11/08/2020 FINDINGS: Mild enlargement of the cardiopericardial silhouette. Indistinct pulmonary vasculature without overt edema. Thoracic spondylosis. No airspace opacity. No substantial airway thickening. No blunting of the costophrenic angle. Mild lower thoracic spondylosis. IMPRESSION: 1. Mild enlargement of the cardiopericardial silhouette potentially with pulmonary venous hypertension but no overt edema. 2. Thoracic spondylosis. Electronically Signed   By: Gaylyn Rong M.D.   On: 06/27/2021 16:44   [place] ASCITES (ABDOMEN LIMITED)  Result Date: 06/27/2021 CLINICAL DATA:  [id number].  Ascites EXAM: LIMITED ABDOMEN ULTRASOUND FOR ASCITES TECHNIQUE: Limited ultrasound survey for ascites was performed in all four abdominal quadrants. COMPARISON:  CT abdomen pelvis 11/21/2015 FINDINGS: No free fluid noted within the left upper, left lower, right upper right lower quadrant. IMPRESSION: No ascites.  Electronically Signed   By: Tish Frederickson M.D.   On: 06/27/2021 18:22    Procedures Procedures    Medications Ordered in ED Medications  lactated ringers bolus 1,000 mL (0 mLs Intravenous Stopped 06/27/21 1752)    ED Course/ Medical Decision Making/ A&P                           Medical Decision Making Amount and/or Complexity of Data Reviewed Labs: ordered. Radiology: ordered.   61 year old male presents to the ED for evaluation.  Please see HPI for further details.  On examination, the patient is afebrile and nontachycardic.  The patient's lung sounds are rhonchorous throughout the right lung fields.  Patient's abdomen is distended and taut in all 4 quadrants.  Patient is nonhypoxic on room air at 95%.  Patient denies any current lightheadedness, dizziness, shortness of breath or chest pain.  Patient alert and orient x3.  Patient follows commands appropriately.  Patient has no lower extremity edema.  Patient worked up utilizing the following labs and imaging studies interpreted me personally: - EKG sinus rhythm - BMP shows elevated potassium of 5.2 with slightly peaked T waves on EKG, elevated creatinine of 1.69 which is elevated from his baseline.  Decreased GFR to 45.  Patient was treated with 1 L of lactated Ringer's bolus - CBC shows decreased hemoglobin at 12.2 however the patient denies any blood in stool, excessive bleeding - BNP resulted at 28 - Ultrasound of abdomen looking for ascites shows no signs of ascites - Chest x-ray shows  possible pulmonary venous hypertension without signs of overt edema.  I agree with radiology interpretation - Troponin 7.  Patient denies any current chest pain.  After 1 L lactated Ringer's was given, the patient was evaluated for orthostatic hypotension.  The patient's blood pressure maintained on standing.  The patient denied any lightheadedness or dizziness at this time.  Patient is requesting discharge.  At this time, I feel that this  patient is stable for discharge.  The patient has been encouraged and advised to follow-up with his primary care doctor tomorrow morning.  The patient states that he will follow these instructions.  The patient was given return precautions and he voiced understanding.  The patient was encouraged to continue hydrating himself as an outpatient and he showed the ability to hydrate himself here in the department.   Final Clinical Impression(s) / ED Diagnoses Final diagnoses:  Orthostatic hypotension    Rx / DC Orders ED Discharge Orders     None         Clent Ridges 06/27/21 1907    Glendora Score, MD 06/27/21 2340

## 2021-06-27 NOTE — Therapy (Incomplete)
OUTPATIENT SPEECH LANGUAGE PATHOLOGY TREATMENT NOTE   Patient Name: [name] MRN: [id number] DOB:[birth year], 61 y.o., male Today's Date: 06/27/2021  PCP: [name], MD REFERRING PROVIDER: [name], MD  END OF SESSION:   End of Session - 06/27/21 1445     Visit Number 10    Number of Visits 17    Date for SLP Re-Evaluation 07/06/21    Authorization Type U[employer]    SLP Start Time 1445    SLP Stop Time  0881    SLP Time Calculation (min) 45 min    Activity Tolerance Patient tolerated treatment well                    Past Medical History:  Diagnosis Date   Diabetic neuropathy ([place])    Diverticulitis    History of hepatitis C    has been treated in the past   History of kidney stones    Hyperlipidemia    Hypertension    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[place])    Other testicular hypofunction    Severe sepsis ([place]) 08/26/2019   Type II or unspecified type diabetes mellitus without mention of complication, not stated as uncontrolled    Vitamin D deficiency    Past Surgical History:  Procedure Laterality Date   AMPUTATION Right 09/01/2019   Procedure: RIGHT BELOW KNEE AMPUTATION;  Surgeon: Newt Minion, MD;  Location: [facility];  Service: Orthopedics;  Laterality: Right;   CHOLECYSTECTOMY  1987   COLONOSCOPY     I & D EXTREMITY Right 08/20/2019   Procedure: IRRIGATION & DEBRIDEMENT RIGHT FOOT PARTIAL CALCANEAL EXCISION;  Surgeon: Newt Minion, MD;  Location: [facility];  Service: Orthopedics;  Laterality: Right;   I & D EXTREMITY Right 08/27/2019   Procedure: IRRIGATION AND DEBRIDEMENT  OF FOOT;  Surgeon: Leandrew Koyanagi, MD;  Location: [facility];  Service: Orthopedics;  Laterality: Right;   ORIF TIBIA FRACTURE     x 3 right leg   Patient Active Problem List   Diagnosis Date Noted   Cerebral thrombosis with cerebral infarction 05/09/2021   Aphasia 05/08/2021   Leukocytosis 05/08/2021   Right below-knee amputee ([place]) 09/03/2019   Hyponatremia 08/26/2019    Cutaneous abscess of right foot    Poorly controlled diabetes mellitus ([place]) 07/16/2019   FHx: heart disease 10/08/2017   Type 2 diabetes mellitus with stage 2 chronic kidney disease, without long-term current use of insulin ([place]) 10/08/2017   Insomnia 07/08/2017   Smoker 08/17/2015   COPD (chronic obstructive pulmonary disease) ([place]) 08/17/2015   T2_NIDDM w/Peripheral Neuropathy 04/01/2014   CKD stage 2 due to type 2 diabetes mellitus ([place]) 08/31/2013   Medication management 05/19/2013   Morbid obesity (BMI 35) 05/19/2013   Essential hypertension    Hyperlipidemia associated with type 2 diabetes mellitus ([place])    Vitamin D deficiency    History of kidney stones    Testosterone deficiency    History of hepatitis C     ONSET DATE: 05/08/2021  REFERRING DIAG: I63.9 (ICD-10-CM) - Acute CVA (cerebrovascular accident) ([place])   PERTINENT HISTORY: [name] is a 61 y.o. male admitted to hospital 4/18-19/2023 d/t sudden onset of word finding difficulty.  CT head was done which revealed old left MCA territory infarcts.  MRI brain was completed which showed a moderate area of left frontal lobe acute infarction in addition to the chronic infarcts in the left frontoparietal regions. PMHx singnificant for diabetes, hepatitis C, hypertension, hyperlipidemia.  THERAPY DIAG:  Aphasia  SUBJECTIVE: ***  PAIN:  Are you having pain? No  OBJECTIVE:   TODAY'S TREATMENT:  06-26-21: ***  06-22-21: Education on physical exercise benefits on brain health. Education on home practice for reading and spelling. Provided recommendations for expanding communication opportunities, to which pt is reserved but agreeable. Pt tells ST he feels like he is making small improvements day by day and continues to be pleased with his progress in speech therapy. Target receptive and verbal language expression through verbally interpreting abstract phrases and sentences. 11/15 accuracy.    06-19-21: Pt with increased  verbalizations this date, tells ST stories about what he's been up to. Stories notable for completeness, good organization, having relevant details. Some halting exhibited but overall repaired for functional communication. Target reading, as this is new concern brought up by patient. Education on reading to verbalization process and why breakdown may be occurring. Encouraged pt to implement HEP to address reading and gave hierarchical ideas to work at appropriate level based on performance. Pt to try and return Friday with report. Provided additional spelling list for continued use of problem solving approach to spelling practice. 18/20 accuracy in completed responses, error'd responses noted to be phonetically spelled.   PATIENT EDUCATION: Education details: see above Person educated: Patient and Spouse Education method: Explanation, Demonstration, and Handouts Education comprehension: verbalized understanding, returned demonstration, and needs further education  HOME EXERCISE PROGRAM: Problem Solving Approach to Spelling worksheet   GOALS: Goals reviewed with patient? Yes   SHORT TERM GOALS: Target date: 06/08/2021   Pt will complete communication PROM during first session.  Baseline: CPIB=16 Goal status: MET    2.  Pt will successfully complete language tasks (e.g. SFA, VNeST) with 80% accuracy with rare min-A  Baseline: 06-06-2021 Goal status: MET   3.  Pt will use trained anomia strategies when answering mod-complex questions in 80% of trials with occasional min-A over 2 sessions Baseline: 06-06-2021 Goal status: MET   4.  Pt will name 7+ items in personally relevant categories with rare min A over 2 sessions Baseline: 05-16-21, 06-06-2021 Goal status: MET   LONG TERM GOALS: Target date: 07/06/2021   Pt will report completion of aphasia HEP with mod-I over 1 week period Baseline:  Goal status: ongoing   2.  Pt will demonstrate usage of trained anomia strategies in 15+ minute  conversation to improve functional communication over 2 sessions Baseline:  Goal status: ongoing   3.  Pt will report improved confidence in communication abilities via PROM by d/c Baseline:  Goal status: ongoing     ASSESSMENT:   CLINICAL IMPRESSION: Patient is a 61 y.o. M who was seen today for aphasia. Pt presents with mild aphasia per QAB administration. Frequent vague speech and word finding difficulties observed in structured tasks and in conversational speech. Reports some improvement since d/c but continues to be concerned about ability to verbally express himself. Impairment in ability to generate grammatically correct sentences in picture description tasks and in some conversational speech. Frequently has to refer to wife to fill in details when providing history and concerns. Additional c/o of saying things "I don't want to come out." Wife endorses some inappropriate speech but both tell ST pt is aware when this happens. Initiated education and training of anomia strategies to aid word finding on structured tasks and in conversation. I recommend skilled ST to address expressive aphasia.    OBJECTIVE IMPAIRMENTS include aphasia. These impairments are limiting patient from effectively communicating at home  and in community. No overt factors affecting potential to achieve goals and functional outcomes. Patient will benefit from skilled SLP services to address above impairments and improve overall function.   REHAB POTENTIAL: Excellent   PLAN: SLP FREQUENCY: 2x/week   SLP DURATION: 8 weeks   PLANNED INTERVENTIONS: Language facilitation, Cueing hierachy, Functional tasks, SLP instruction and feedback, Compensatory strategies, and Patient/family education   Su Monks, CCC-SLP 06/27/2021, 2:45 PM

## 2021-06-27 NOTE — Discharge Instructions (Addendum)
Please return to the ED with any new symptoms such as shortness of breath, chest pain, lightheadedness or dizziness Please follow-up with your PCP, Dr. Oneta Rack Please read the attached informational guide concerning orthostatic hypotension Please continue to hydrate yourself at home

## 2021-06-27 NOTE — Patient Outreach (Signed)
[facility]) Care Management  06/27/2021  [name] [birth year] [id number]   [facility] Case closure - case closure unable to reach  The care management team was consulted for assistance with care management and/or care coordination needs. on 05/21/21 RED ON EMMI ALERT - Stroke Day # 9 Date: 4/30 Red Alert Reason: Lost interest in things used to enjoy? YES   Updated on unsuccessful outreaches on 05/21/21 & 05/24/21 by co worker, [name]  Unsuccessful outreach letter mailed on 05/21/21 without a response   Plan [facility] RN CM will close case after no response from patient within 10 business days. Unable to reach Case closure letters sent to patient and MD  Cala Bradford L. Noelle Penner, RN, BSN, CCM [facility] Telephonic Care Management Care Coordinator Office number [phone] [facility] number [phone] Fax number [phone]

## 2021-06-28 NOTE — Therapy (Signed)
[facility] [address] [place], [place], [address] Phone: [phone]   Fax:  [phone]  Patient Details  Name: [name] MRN: [id number] Date of Birth: 1960-11-29 Referring Provider:  Lucky Cowboy, MD  Encounter Date: 06/27/2021  Arrived, cancelled.   Pt presenting with dizziness upon standing. Requesting EMS come to assess. Unable to complete ST visit this date.   Maia Breslow, CCC-SLP 06/28/2021, 12:55 PM  [facility] [address] [place], [place], [address] Phone: [phone]   Fax:  [phone]

## 2021-06-28 NOTE — Progress Notes (Signed)
Assessment and Plan:  [name] was seen today for follow-up.  Diagnoses and all orders for this visit:  Cerebral thrombosis with cerebral infarction Continue to follow with Neurology Continue [facility] trial as directed. Keep BG and BP well controlled. Continue smoking cessation.  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  Perform daily foot/skin check, notify office of any concerning changes.  Check A1C    Essential hypertension - continue medications, DASH diet, exercise and monitor at home. Call if greater than 130/80.    Dehydration Encouraged to push fluids and try to get at least 48-64 ounces a day -     CBC with Differential/Platelet -     BASIC METABOLIC PANEL WITH GFR  History of toe fracture Nondisplaced on xray Continue to monitor, if swelling returns or pain notify the office      Further disposition pending results of labs. Discussed med's effects and SE's.   Over 30 minutes of exam, counseling, chart review, and critical decision making was performed.   Future Appointments  Date Time Provider [place]  06/29/2021  2:45 PM Marzetta Board, CCC-SLP [facility]  07/19/2021  3:00 PM [name], MD [facility] None  12/17/2021 10:00 AM Lomax, Amy, NP [facility] None    ------------------------------------------------------------------------------------------------------------------   HPI BP 132/68 Comment: 132/68 standing  Pulse 62   Temp 97.9 F (36.6 C)   Resp 16   Ht 6\' 3"  (1.905 m)   Wt 261 lb (118.4 kg)   SpO2 99%   BMI 32.62 kg/m   61 y.o.male presents for ER follow up from 06/27/21: [name] is a 61 y.o. male with history of [name] to the PAD, diabetic  neuropathy, hepatitis C, hyperlipidemia, hypertension, type 2 diabetes, recent CVA on 4/18.  Patient presents to the ED for evaluation of orthostasis.  Patient wife present at bedside.  Patient and patient wife states that patient was at speech therapy earlier today around 3 PM.  The patient states that he had gone from a sitting to standing position when he had sudden onset dizziness, feeling like he was going to "fall out".  Patient and patient wife states that at this time the patient was lowered to the ground, he did not hit his head, he did not lose consciousness.  The patient currently takes clopidogrel however the patient and his wife both deny any kind of head trauma occurred.  Patient states that for the last week he has not been hydrating himself properly.  The patient adds that for the last 1 week he has been experiencing productive cough that is clear in color.  Patient states that he recently quit smoking on 4/17. Patient denies any weakness, fever, body aches or chills, chest pain, shortness of breath, abdominal pain, nausea, vomiting.   On examination, the patient is afebrile and nontachycardic.  The patient's lung sounds are rhonchorous throughout the right lung fields.  Patient's abdomen is distended and taut in all 4 quadrants.  Patient is nonhypoxic on room air at 95%.  Patient denies any current lightheadedness, dizziness, shortness of breath or chest pain.  Patient alert and orient x3.  Patient follows commands appropriately.  Patient has no lower extremity edema.   Patient worked up utilizing the following labs and imaging studies interpreted me personally: -  EKG sinus rhythm - BMP shows elevated potassium of 5.2 with slightly peaked T waves on EKG, elevated creatinine of 1.69 which is elevated from his baseline.  Decreased GFR to 45.  Patient was treated with 1 L of lactated Ringer's bolus - CBC shows decreased hemoglobin at 12.2 however the patient denies any blood in stool, excessive  bleeding - BNP resulted at 28 - Ultrasound of abdomen looking for ascites shows no signs of ascites - Chest x-ray shows possible pulmonary venous hypertension without signs of overt edema.  I agree with radiology interpretation - Troponin 7.  Patient denies any current chest pain. After 1 L lactated Ringer's was given, the patient was evaluated for orthostatic hypotension.  The patient's blood pressure maintained on standing.  The patient denied any lightheadedness or dizziness at this time.  Patient is requesting discharge.  He had a fracture of left 5th toe 2 weeks ago from trying to remove prosthesis with is foot.  Denies pain, states discoloration has resolved  Xray showed non dislocated fracture.  He has been drinking more water-but still only drinks maybe 3 glasses of fluid a day.  He is having no further issues of dizziness.    He is checking his blood pressure and has been running 160/90, he is currently on Lisinopril 20 mg, HCTZ 12.5 mg QD and verapamil 240 mg QD BP Readings from Last 3 Encounters:  06/29/21 132/68  06/27/21 104/60  06/19/21 (!) 165/91   His blood sugars have been running in 120's. He is currently on Trulicity 0.75mg  QW and Metformin 500 mg BID. 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[place])    Other testicular hypofunction    Severe sepsis ([place]) 08/26/2019   Type II or unspecified type diabetes mellitus without mention of complication, not stated as uncontrolled    Vitamin D deficiency      Allergies  Allergen Reactions   Invokamet [Canagliflozin-Metformin Hcl] Other (See Comments)    Extremity edema/caused pain   Invokana [Canagliflozin] Other (See Comments)    Extremity edema/caused pain    Codeine Camsylate [Codeine] Rash   Morphine And Related Rash    Current Outpatient Medications on File Prior to Visit  Medication Sig   aspirin 81 MG tablet Take 1 tablet (81 mg total) by mouth daily. Take along with plavix for 3 months, after 3 months stop aspirin and continue plavix   cetirizine (ZYRTEC) 10 MG tablet Take 10 mg by mouth daily as needed for allergies.   Cholecalciferol (VITAMIN D) 50 MCG (2000 UT) CAPS Take 2,000 Units by mouth 2 (two) times daily.   citalopram (CELEXA) 40 MG tablet Take 1 tablet Daily for Mood & Chronic Anxiety   clopidogrel (PLAVIX) 75 MG tablet Take 1 tablet (75 mg total) by mouth daily.   Dulaglutide (TRULICITY) [id number] [id number] SOPN Inject 0.75 mg into the skin once a week. (Patient taking differently: Inject 0.75 mg into the skin once a week. Friday)   hydrochlorothiazide (HYDRODIURIL) 25 MG tablet Take  1 tablet  Daily  for BP & Fluid Retention / Ankle Swelling (Patient taking differently: Take 12.5 mg by mouth daily. for BP &  Fluid Retention / Ankle Swelling)   lisinopril (ZESTRIL) 20 MG tablet Take 1 tablet Daily for BP & Kidney Protection   metFORMIN (GLUCOPHAGE-XR) 500 MG 24 hr tablet Take 2 tablets 2 x /day with Meals  for Diabetes (Patient taking differently: 1,000 mg 2 (two) times daily. Take 1 tablet in the morning and 2 in the evening)   pantoprazole (PROTONIX) 40 MG tablet Take 1 tablet (40 mg total) by mouth daily.   rosuvastatin (CRESTOR) 40 MG tablet Take one tablet daily for Cholesterol. (Patient taking differently: Take 40 mg by mouth daily. Take one tablet daily for Cholesterol.)   Study - OCEANIC-STROKE - asundexian 50 mg or placebo tablet (PI-Sethi) Take 1 tablet (50 mg total) by mouth daily. For investigational use only. Take 1 tablet by mouth once daily at the same time each day, preferably in the morning. Please bring bottle to every visit.   verapamil (CALAN-SR) 240 MG CR tablet Take 1 tablet Daily for BP & Heart Rhythm                                 /                         TAKE                              BY                       MOUTH                  ONCE DAILY   No current facility-administered medications on file prior to visit.    ROS: all negative except above.   Physical Exam:  BP 132/68 Comment: 132/68 standing  Pulse 62   Temp 97.9 F (36.6 C)   Resp 16   Ht 6\' 3"  (1.905 m)   Wt 261 lb (118.4 kg)   SpO2 99%   BMI 32.62 kg/m   General Appearance: Well nourished, in no apparent distress. Eyes: PERRLA, EOMs, conjunctiva no swelling or erythema Sinuses: No Frontal/maxillary tenderness ENT/Mouth: Ext aud canals clear, TMs without erythema, bulging. No erythema, swelling, or exudate on post pharynx.  Tonsils not swollen or erythematous. Hearing normal.  Neck: Supple, thyroid normal.  Respiratory: Respiratory effort normal, BS equal bilaterally without rales, rhonchi, wheezing or stridor.  Cardio: RRR with no MRGs. Brisk peripheral pulses without edema.  Abdomen: Soft, + BS.  Non tender, no guarding, rebound, hernias, masses. Lymphatics: Non tender without lymphadenopathy.  Musculoskeletal:  Left 5th toe with minimal edema, no discoloration. No pain.    Skin: Left BKA stump no skin breakdown noted Neuro: Cranial nerves intact. Normal muscle tone, no cerebellar symptoms. Sensation intact.  Psych: Awake and oriented X 3, normal affect, Insight and Judgment appropriate.     Alycia Rossetti, NP 10:05 AM [facility]

## 2021-06-29 ENCOUNTER — Encounter: Payer: Self-pay | Admitting: Nurse Practitioner

## 2021-06-29 ENCOUNTER — Ambulatory Visit: Payer: BC Managed Care – PPO

## 2021-06-29 ENCOUNTER — Ambulatory Visit (INDEPENDENT_AMBULATORY_CARE_PROVIDER_SITE_OTHER): Payer: BC Managed Care – PPO | Admitting: Nurse Practitioner

## 2021-06-29 VITALS — BP 132/68 | HR 62 | Temp 97.9°F | Resp 16 | Ht 75.0 in | Wt 261.0 lb

## 2021-06-29 DIAGNOSIS — E1122 Type 2 diabetes mellitus with diabetic chronic kidney disease: Secondary | ICD-10-CM | POA: Diagnosis not present

## 2021-06-29 DIAGNOSIS — N182 Chronic kidney disease, stage 2 (mild): Secondary | ICD-10-CM | POA: Diagnosis not present

## 2021-06-29 DIAGNOSIS — Z89511 Acquired absence of right leg below knee: Secondary | ICD-10-CM | POA: Diagnosis not present

## 2021-06-29 DIAGNOSIS — E860 Dehydration: Secondary | ICD-10-CM

## 2021-06-29 DIAGNOSIS — R41841 Cognitive communication deficit: Secondary | ICD-10-CM | POA: Diagnosis not present

## 2021-06-29 DIAGNOSIS — Z8781 Personal history of (healed) traumatic fracture: Secondary | ICD-10-CM

## 2021-06-29 DIAGNOSIS — I6330 Cerebral infarction due to thrombosis of unspecified cerebral artery: Secondary | ICD-10-CM

## 2021-06-29 DIAGNOSIS — E1140 Type 2 diabetes mellitus with diabetic neuropathy, unspecified: Secondary | ICD-10-CM | POA: Diagnosis not present

## 2021-06-29 DIAGNOSIS — I10 Essential (primary) hypertension: Secondary | ICD-10-CM

## 2021-06-29 DIAGNOSIS — R4701 Aphasia: Secondary | ICD-10-CM | POA: Diagnosis not present

## 2021-06-29 DIAGNOSIS — E1149 Type 2 diabetes mellitus with other diabetic neurological complication: Secondary | ICD-10-CM

## 2021-06-29 NOTE — Therapy (Signed)
OUTPATIENT SPEECH LANGUAGE PATHOLOGY TREATMENT NOTE   Patient Name: [name] MRN: [id number] DOB:Aug 07, 1960, 61 y.o., male Today's Date: 06/29/2021  PCP: Michael Pinto, MD REFERRING PROVIDER: [name], MD  END OF SESSION:   End of Session - 06/29/21 1439     Visit Number 10    Number of Visits 17    Date for SLP Re-Evaluation 07/06/21    Authorization Type U[employer]    SLP Start Time 1440    SLP Stop Time  1525    SLP Time Calculation (min) 45 min    Activity Tolerance Patient tolerated treatment well             Speech Therapy Progress Note  Dates of Reporting Period: 05-11-21 to current  Objective Reports of Subjective Statement: Pt has been seen for 10 ST visits targeting aphasia. Pt has exhibited good progress thus far per primary SLP notes.   Objective Measurements: Pt has exhibited improvements in verbal expression with increased ability to repair communication breakdowns with improved accuracy. Some difficulty with reading and spelling indicated.   Goal Update: see goals below   Plan: Pt would like to add additional ST visits as he feels he has not returned to baseline.   Reason Skilled Services are Required: Given good progress exhibited thus far and functional deficits reported, pt would continue to benefit from skilled ST intervention to maximize outcomes.   Past Medical History:  Diagnosis Date   Diabetic neuropathy ([place])    Diverticulitis    History of hepatitis C    has been treated in the past   History of kidney stones    Hyperlipidemia    Hypertension    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[place])    Other testicular hypofunction    Severe sepsis ([place]) 08/26/2019   Type II or unspecified type diabetes mellitus without mention of complication, not stated as uncontrolled    Vitamin D deficiency    Past Surgical History:  Procedure Laterality Date   AMPUTATION Right 09/01/2019   Procedure: RIGHT BELOW KNEE AMPUTATION;  Surgeon: Michael Minion, MD;   Location: [facility];  Service: Orthopedics;  Laterality: Right;   CHOLECYSTECTOMY  1987   COLONOSCOPY     I & D EXTREMITY Right 08/20/2019   Procedure: IRRIGATION & DEBRIDEMENT RIGHT FOOT PARTIAL CALCANEAL EXCISION;  Surgeon: Michael Minion, MD;  Location: [facility];  Service: Orthopedics;  Laterality: Right;   I & D EXTREMITY Right 08/27/2019   Procedure: IRRIGATION AND DEBRIDEMENT  OF FOOT;  Surgeon: Michael Koyanagi, MD;  Location: [facility];  Service: Orthopedics;  Laterality: Right;   ORIF TIBIA FRACTURE     x 3 right leg   Patient Active Problem List   Diagnosis Date Noted   Cerebral thrombosis with cerebral infarction 05/09/2021   Aphasia 05/08/2021   Leukocytosis 05/08/2021   Right below-knee amputee ([place]) 09/03/2019   Hyponatremia 08/26/2019   Cutaneous abscess of right foot    Poorly controlled diabetes mellitus ([place]) 07/16/2019   FHx: heart disease 10/08/2017   Type 2 diabetes mellitus with stage 2 chronic kidney disease, without long-term current use of insulin ([place]) 10/08/2017   Insomnia 07/08/2017   Smoker 08/17/2015   COPD (chronic obstructive pulmonary disease) ([place]) 08/17/2015   T2_NIDDM w/Peripheral Neuropathy 04/01/2014   CKD stage 2 due to type 2 diabetes mellitus ([place]) 08/31/2013   Medication management 05/19/2013   Morbid obesity (BMI 35) 05/19/2013   Essential hypertension    Hyperlipidemia associated with type  2 diabetes mellitus ([place])    Vitamin D deficiency    History of kidney stones    Testosterone deficiency    History of hepatitis C     ONSET DATE: 05/08/2021  REFERRING DIAG: I63.9 (ICD-10-CM) - Acute CVA (cerebrovascular accident) ([place])   PERTINENT HISTORY: [name] is a 61 y.o. male admitted to hospital 4/18-19/2023 d/t sudden onset of word finding difficulty.  CT head was done which revealed old left MCA territory infarcts.  MRI brain was completed which showed a moderate area of left frontal lobe acute infarction in addition to the chronic infarcts in the  left frontoparietal regions. PMHx singnificant for diabetes, hepatitis C, hypertension, hyperlipidemia.   THERAPY DIAG: Aphasia  SUBJECTIVE: "I'm feeling better"   PAIN:  Are you having pain? No  OBJECTIVE:   TODAY'S TREATMENT:  06-29-21: Improvements in verbal expression exhibited and reported, with pt exhibiting anomia x1 and occasional pausing. Some reduced conversation cohesion exhibited requiring SLP and/or wife clarification. Pt endorsed difficulty with reading closed captioning on television due to slow processing speed. SLP assessed reading today, with delays and frequent errors noted (omissions, substitutions, difficulty with word recognition) at sentence and paragraph levels. Inconsistent error awareness exhibited. Reading comprehension of paragraph required several repetitions for accuracy. SLP recommended oral reading at least 5 minutes per day and completing HEP from last session with primary SLP.   06-22-21: Education on physical exercise benefits on brain health. Education on home practice for reading and spelling. Provided recommendations for expanding communication opportunities, to which pt is reserved but agreeable. Pt tells ST he feels like he is making small improvements day by day and continues to be pleased with his progress in speech therapy. Target receptive and verbal language expression through verbally interpreting abstract phrases and sentences. 11/15 accuracy.    06-19-21: Pt with increased verbalizations this date, tells ST stories about what he's been up to. Stories notable for completeness, good organization, having relevant details. Some halting exhibited but overall repaired for functional communication. Target reading, as this is new concern brought up by patient. Education on reading to verbalization process and why breakdown may be occurring. Encouraged pt to implement HEP to address reading and gave hierarchical ideas to work at appropriate level based on  performance. Pt to try and return Friday with report. Provided additional spelling list for continued use of problem solving approach to spelling practice. 18/20 accuracy in completed responses, error'd responses noted to be phonetically spelled.   06-08-21: Pt reports unable to complete HEP, did not have time. Reviewed memory and orientation compensations targeted last session, pt endorses understanding, will attempt to implement calendar this weekend. Target expanded verbal expression and organization of thoughts using NARNIA framework. Initial max-A, faded to occasional min-A for narrative story telling with visual framework. Generate examples of when narratives are required, pt with 3 examples. Discuss compensations pt can use to organize thoughts prior to conversations with writing down thoughts or key points prior to. Verbalizes understanding. Tells ST he is pleased with progress being made in ST.   06-06-21: Pt reports to starting to notice things he's having trouble with. Example given, pt having difficulty orienting to and manipulating time, schedule management challenge. Education on implementation of calendar system to provide visual cue to assist in orientation. Training on morning routine to include referencing calendar and spouse referencing calendar when discussing appointment times/dates. Report decreased motivation and desire to interact with others. Report some R visual neglect. Target spelling d/t continued reports of  difficulty in this area. Use of [employer] Word List, focus on irregularly spelled words. Initiated training use of Problem Solving Approach to spelling. Education on process of treatment and use of spell check. Pt and spouse report access to iPad to use as spell checker. Completed x11 words this date. Initial spelling accurate 100% of trials. Despite accuracy, cued for self-review following each spelling. Provide additional word list to use for home practice.    PATIENT  EDUCATION: Education details: see above Person educated: Patient and Spouse Education method: Explanation, Demonstration, and Handouts Education comprehension: verbalized understanding, returned demonstration, and needs further education  HOME EXERCISE PROGRAM: Problem Solving Approach to Spelling worksheet   GOALS: Goals reviewed with patient? Yes   SHORT TERM GOALS: Target date: 06/08/2021   Pt will complete communication PROM during first session.  Baseline: CPIB=16 Goal status: MET    2.  Pt will successfully complete language tasks (e.g. SFA, VNeST) with 80% accuracy with rare min-A  Baseline: 06-06-2021 Goal status: MET   3.  Pt will use trained anomia strategies when answering mod-complex questions in 80% of trials with occasional min-A over 2 sessions Baseline: 06-06-2021 Goal status: MET   4.  Pt will name 7+ items in personally relevant categories with rare min A over 2 sessions Baseline: 05-16-21, 06-06-2021 Goal status: MET   LONG TERM GOALS: Target date: 07/06/2021   Pt will report completion of aphasia HEP with mod-I over 1 week period Baseline:  Goal status: ongoing   2.  Pt will demonstrate usage of trained anomia strategies in 15+ minute conversation to improve functional communication over 2 sessions Baseline:  Goal status: ongoing   3.  Pt will report improved confidence in communication abilities via PROM by d/c Baseline:  Goal status: ongoing     ASSESSMENT:   CLINICAL IMPRESSION: Patient is a 61 y.o. M who was seen today for aphasia. Pt initially presented with mild aphasia per QAB administration. Less frequent vague speech and word finding difficulties observed in structured tasks and in conversational speech. Conducted ongoing education and training of anomia strategies to aid word finding on structured tasks and in conversation. Targeted reading today as this is a reported functional decline. I recommend skilled ST to address expressive aphasia.     OBJECTIVE IMPAIRMENTS include aphasia. These impairments are limiting patient from effectively communicating at home and in community. No overt factors affecting potential to achieve goals and functional outcomes. Patient will benefit from skilled SLP services to address above impairments and improve overall function.   REHAB POTENTIAL: Excellent   PLAN: SLP FREQUENCY: 2x/week   SLP DURATION: 8 weeks   PLANNED INTERVENTIONS: Language facilitation, Cueing hierachy, Functional tasks, SLP instruction and feedback, Compensatory strategies, and Patient/family education   Marzetta Board, CCC-SLP 06/29/2021, 2:39 PM

## 2021-06-30 LAB — BASIC METABOLIC PANEL WITH GFR
BUN/Creatinine Ratio: 21 (calc) (ref 6–22)
BUN: 26 mg/dL — ABNORMAL HIGH (ref 7–25)
CO2: 23 mmol/L (ref 20–32)
Calcium: 10.2 mg/dL (ref 8.6–10.3)
Chloride: 101 mmol/L (ref 98–110)
Creat: 1.23 mg/dL (ref 0.70–1.35)
Glucose, Bld: 136 mg/dL — ABNORMAL HIGH (ref 65–99)
Potassium: 5.6 mmol/L — ABNORMAL HIGH (ref 3.5–5.3)
Sodium: 136 mmol/L (ref 135–146)
eGFR: 66 mL/min/{1.73_m2} (ref >=60)

## 2021-06-30 LAB — CBC WITH DIFFERENTIAL/PLATELET
Absolute Monocytes: 1032 cells/uL — ABNORMAL HIGH (ref 200–950)
Basophils Absolute: 102 cells/uL (ref 0–200)
Basophils Relative: 1.1 %
Eosinophils Absolute: 140 cells/uL (ref 15–500)
Eosinophils Relative: 1.5 %
HCT: 42.3 % (ref 38.5–50.0)
Hemoglobin: 14.8 g/dL (ref 13.2–17.1)
Lymphs Abs: 2613 cells/uL (ref 850–3900)
MCH: 32.4 pg (ref 27.0–33.0)
MCHC: 35 g/dL (ref 32.0–36.0)
MCV: 92.6 fL (ref 80.0–100.0)
MPV: 11.1 fL (ref 7.5–12.5)
Monocytes Relative: 11.1 %
Neutro Abs: 5413 cells/uL (ref 1500–7800)
Neutrophils Relative %: 58.2 %
Platelets: 226 10*3/uL (ref 140–400)
RBC: 4.57 10*6/uL (ref 4.20–5.80)
RDW: 12.6 % (ref 11.0–15.0)
Total Lymphocyte: 28.1 %
WBC: 9.3 10*3/uL (ref 3.8–10.8)

## 2021-07-02 NOTE — Progress Notes (Signed)
Hi Pam,  Please let patient know that his toe is fractured, nondisplaced.  How is he feeling, and did the apt he had with prothesis or orthopedics (after he had f/u with me) provide any further inforamtion for him?    Thanks [employer]

## 2021-07-03 ENCOUNTER — Ambulatory Visit: Payer: BC Managed Care – PPO | Admitting: Speech Pathology

## 2021-07-03 DIAGNOSIS — R4701 Aphasia: Secondary | ICD-10-CM | POA: Diagnosis not present

## 2021-07-03 DIAGNOSIS — R41841 Cognitive communication deficit: Secondary | ICD-10-CM | POA: Diagnosis not present

## 2021-07-03 NOTE — Therapy (Signed)
OUTPATIENT SPEECH LANGUAGE PATHOLOGY TREATMENT NOTE   Patient Name: [name] MRN: [id number] DOB:[birth year], 61 y.o., male Today's Date: 07/03/2021  PCP: [name], MD REFERRING PROVIDER: [name], MD  END OF SESSION:   End of Session - 07/03/21 1535     Visit Number 11    Number of Visits 17    Date for SLP Re-Evaluation 07/06/21    Authorization Type UHC    SLP Start Time 8527    SLP Stop Time  1615    SLP Time Calculation (min) 40 min    Activity Tolerance Patient tolerated treatment well             Speech Therapy Progress Note  Dates of Reporting Period: 05-11-21 to current  Objective Reports of Subjective Statement: Pt has been seen for 10 ST visits targeting aphasia. Pt has exhibited good progress thus far per primary SLP notes.   Objective Measurements: Pt has exhibited improvements in verbal expression with increased ability to repair communication breakdowns with improved accuracy. Some difficulty with reading and spelling indicated.   Goal Update: see goals below   Plan: Pt would like to add additional ST visits as he feels he has not returned to baseline.   Reason Skilled Services are Required: Given good progress exhibited thus far and functional deficits reported, pt would continue to benefit from skilled ST intervention to maximize outcomes.   Past Medical History:  Diagnosis Date   Diabetic neuropathy ([place])    Diverticulitis    History of hepatitis C    has been treated in the past   History of kidney stones    Hyperlipidemia    Hypertension    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[place])    Other testicular hypofunction    Severe sepsis ([place]) 08/26/2019   Type II or unspecified type diabetes mellitus without mention of complication, not stated as uncontrolled    Vitamin D deficiency    Past Surgical History:  Procedure Laterality Date   AMPUTATION Right 09/01/2019   Procedure: RIGHT BELOW KNEE AMPUTATION;  Surgeon: Newt Minion,  MD;  Location: [facility];  Service: Orthopedics;  Laterality: Right;   CHOLECYSTECTOMY  1987   COLONOSCOPY     I & D EXTREMITY Right 08/20/2019   Procedure: IRRIGATION & DEBRIDEMENT RIGHT FOOT PARTIAL CALCANEAL EXCISION;  Surgeon: Newt Minion, MD;  Location: [facility];  Service: Orthopedics;  Laterality: Right;   I & D EXTREMITY Right 08/27/2019   Procedure: IRRIGATION AND DEBRIDEMENT  OF FOOT;  Surgeon: Leandrew Koyanagi, MD;  Location: [facility];  Service: Orthopedics;  Laterality: Right;   ORIF TIBIA FRACTURE     x 3 right leg   Patient Active Problem List   Diagnosis Date Noted   Cerebral thrombosis with cerebral infarction 05/09/2021   Aphasia 05/08/2021   Leukocytosis 05/08/2021   Right below-knee amputee ([place]) 09/03/2019   Hyponatremia 08/26/2019   Cutaneous abscess of right foot    Poorly controlled diabetes mellitus ([place]) 07/16/2019   FHx: heart disease 10/08/2017   Type 2 diabetes mellitus with stage 2 chronic kidney disease, without long-term current use of insulin ([place]) 10/08/2017   Insomnia 07/08/2017   Smoker 08/17/2015   COPD (chronic obstructive pulmonary disease) ([place]) 08/17/2015   T2_NIDDM w/Peripheral Neuropathy 04/01/2014   CKD stage 2 due to type 2 diabetes mellitus ([place]) 08/31/2013   Medication management 05/19/2013   Morbid obesity (BMI 35) 05/19/2013   Essential hypertension    Hyperlipidemia associated with type  2 diabetes mellitus ([place])    Vitamin D deficiency    History of kidney stones    Testosterone deficiency    History of hepatitis C     ONSET DATE: 05/08/2021  REFERRING DIAG: I63.9 (ICD-10-CM) - Acute CVA (cerebrovascular accident) ([place])   PERTINENT HISTORY: [name] is a 61 y.o. male admitted to hospital 4/18-19/2023 d/t sudden onset of word finding difficulty.  CT head was done which revealed old left MCA territory infarcts.  MRI brain was completed which showed a moderate area of left frontal lobe acute infarction in addition to the chronic infarcts in  the left frontoparietal regions. PMHx singnificant for diabetes, hepatitis C, hypertension, hyperlipidemia.   THERAPY DIAG: Aphasia  SUBJECTIVE: "I'm feeling better"   PAIN:  Are you having pain? No  OBJECTIVE:   TODAY'S TREATMENT:  07-03-21: Pt reports frustration with tasks previously done effortlessly, such as driving, balancing checkbook, managing schedule. Suspect cognitive deficits persist following CVA which are just not presenting as pt returns to completing previously done activities. Demonstrating errors with reading, mental, written, and calculator math. Language appears to be less impaired by word finding and more so by attention, processing, and organization of complex thoughts. Examined math abilities with mod-complex subtraction. Max-A for calculator usage, pt hitting incorrect functions, difficulty sequencing. Using written implement, able to accurately subtract 2 digit from 3 digit x3 using carryover technique with initial verbal A. Education on strategies of time pressure management, avoiding multi-tasking, and double checking work as progress. Pt verbalizes understanding.   06-29-21: Improvements in verbal expression exhibited and reported, with pt exhibiting anomia x1 and occasional pausing. Some reduced conversation cohesion exhibited requiring SLP and/or wife clarification. Pt endorsed difficulty with reading closed captioning on television due to slow processing speed. SLP assessed reading today, with delays and frequent errors noted (omissions, substitutions, difficulty with word recognition) at sentence and paragraph levels. Inconsistent error awareness exhibited. Reading comprehension of paragraph required several repetitions for accuracy. SLP recommended oral reading at least 5 minutes per day and completing HEP from last session with primary SLP.   06-22-21: Education on physical exercise benefits on brain health. Education on home practice for reading and spelling. Provided  recommendations for expanding communication opportunities, to which pt is reserved but agreeable. Pt tells ST he feels like he is making small improvements day by day and continues to be pleased with his progress in speech therapy. Target receptive and verbal language expression through verbally interpreting abstract phrases and sentences. 11/15 accuracy.    PATIENT EDUCATION: Education details: see above Person educated: Patient and Spouse Education method: Explanation, Demonstration, and Handouts Education comprehension: verbalized understanding, returned demonstration, and needs further education  HOME EXERCISE PROGRAM: Problem Solving Approach to Spelling worksheet   GOALS: Goals reviewed with patient? Yes   SHORT TERM GOALS: Target date: 06/08/2021   Pt will complete communication PROM during first session.  Baseline: CPIB=16 Goal status: MET    2.  Pt will successfully complete language tasks (e.g. SFA, VNeST) with 80% accuracy with rare min-A  Baseline: 06-06-2021 Goal status: MET   3.  Pt will use trained anomia strategies when answering mod-complex questions in 80% of trials with occasional min-A over 2 sessions Baseline: 06-06-2021 Goal status: MET   4.  Pt will name 7+ items in personally relevant categories with rare min A over 2 sessions Baseline: 05-16-21, 06-06-2021 Goal status: MET   LONG TERM GOALS: Target date: 07/06/2021   Pt will report completion of aphasia HEP  with mod-I over 1 week period Baseline: 07-03-21 Goal status: ongoing   2.  Pt will demonstrate usage of trained anomia strategies in 15+ minute conversation to improve functional communication over 2 sessions Baseline: 07-03-21 Goal status: ongoing   3.  Pt will report improved confidence in communication abilities via PROM by d/c Baseline:  Goal status: ongoing     ASSESSMENT:   CLINICAL IMPRESSION: Patient is a 61 y.o. M who was seen today for aphasia. Pt initially presented with mild aphasia  per QAB administration. Less frequent vague speech and word finding difficulties observed in structured tasks and in conversational speech. Conducted ongoing education and training of anomia strategies to aid word finding on structured tasks and in conversation. Targeted reading today as this is a reported functional decline. I recommend skilled ST to address expressive aphasia.    OBJECTIVE IMPAIRMENTS include aphasia. These impairments are limiting patient from effectively communicating at home and in community. No overt factors affecting potential to achieve goals and functional outcomes. Patient will benefit from skilled SLP services to address above impairments and improve overall function.   REHAB POTENTIAL: Excellent   PLAN: SLP FREQUENCY: 2x/week   SLP DURATION: 8 weeks   PLANNED INTERVENTIONS: Language facilitation, Cueing hierachy, Functional tasks, SLP instruction and feedback, Compensatory strategies, and Patient/family education   Su Monks, CCC-SLP 07/03/2021, 4:12 PM

## 2021-07-05 ENCOUNTER — Ambulatory Visit: Payer: BC Managed Care – PPO | Admitting: Speech Pathology

## 2021-07-05 DIAGNOSIS — R4701 Aphasia: Secondary | ICD-10-CM

## 2021-07-05 DIAGNOSIS — R41841 Cognitive communication deficit: Secondary | ICD-10-CM | POA: Diagnosis not present

## 2021-07-05 NOTE — Therapy (Signed)
OUTPATIENT SPEECH LANGUAGE PATHOLOGY TREATMENT NOTE & RECERTIFICATION   Patient Name: [name] MRN: [id number] DOB:28-Oct-1960, 61 y.o., male Today's Date: 07/06/2021  PCP: [name], MD REFERRING PROVIDER: [name], MD  END OF SESSION:   End of Session - 07/05/21 1446     Visit Number 12    Number of Visits 24   adding visits for recertification   Date for SLP Re-Evaluation 08/17/21    Authorization Type BCBS    SLP Start Time 1446    SLP Stop Time  3383    SLP Time Calculation (min) 44 min    Activity Tolerance Patient tolerated treatment well               Past Medical History:  Diagnosis Date   Diabetic neuropathy ([place])    Diverticulitis    History of hepatitis C    has been treated in the past   History of kidney stones    Hyperlipidemia    Hypertension    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[place])    Other testicular hypofunction    Severe sepsis ([place]) 08/26/2019   Type II or unspecified type diabetes mellitus without mention of complication, not stated as uncontrolled    Vitamin D deficiency    Past Surgical History:  Procedure Laterality Date   AMPUTATION Right 09/01/2019   Procedure: RIGHT BELOW KNEE AMPUTATION;  Surgeon: Newt Minion, MD;  Location: [facility];  Service: Orthopedics;  Laterality: Right;   CHOLECYSTECTOMY  1987   COLONOSCOPY     I & D EXTREMITY Right 08/20/2019   Procedure: IRRIGATION & DEBRIDEMENT RIGHT FOOT PARTIAL CALCANEAL EXCISION;  Surgeon: Newt Minion, MD;  Location: [facility];  Service: Orthopedics;  Laterality: Right;   I & D EXTREMITY Right 08/27/2019   Procedure: IRRIGATION AND DEBRIDEMENT  OF FOOT;  Surgeon: Leandrew Koyanagi, MD;  Location: [facility];  Service: Orthopedics;  Laterality: Right;   ORIF TIBIA FRACTURE     x 3 right leg   Patient Active Problem List   Diagnosis Date Noted   Cerebral thrombosis with cerebral infarction 05/09/2021   Aphasia 05/08/2021   Leukocytosis 05/08/2021   Right below-knee amputee ([place])  09/03/2019   Hyponatremia 08/26/2019   Cutaneous abscess of right foot    Poorly controlled diabetes mellitus ([place]) 07/16/2019   FHx: heart disease 10/08/2017   Type 2 diabetes mellitus with stage 2 chronic kidney disease, without long-term current use of insulin ([place]) 10/08/2017   Insomnia 07/08/2017   Smoker 08/17/2015   COPD (chronic obstructive pulmonary disease) ([place]) 08/17/2015   T2_NIDDM w/Peripheral Neuropathy 04/01/2014   CKD stage 2 due to type 2 diabetes mellitus ([place]) 08/31/2013   Medication management 05/19/2013   Morbid obesity (BMI 35) 05/19/2013   Essential hypertension    Hyperlipidemia associated with type 2 diabetes mellitus ([place])    Vitamin D deficiency    History of kidney stones    Testosterone deficiency    History of hepatitis C     ONSET DATE: 05/08/2021  REFERRING DIAG: I63.9 (ICD-10-CM) - Acute CVA (cerebrovascular accident) ([place])   PERTINENT HISTORY: [name] is a 61 y.o. male admitted to hospital 4/18-19/2023 d/t sudden onset of word finding difficulty.  CT head was done which revealed old left MCA territory infarcts.  MRI brain was completed which showed a moderate area of left frontal lobe acute infarction in addition to the chronic infarcts in the left frontoparietal regions. PMHx singnificant for diabetes, hepatitis C, hypertension,  hyperlipidemia.   THERAPY DIAG: Aphasia  Cognitive communication deficit  SUBJECTIVE: "he talked on the phone a lot yesterday"   PAIN:  Are you having pain? No  OBJECTIVE:   TODAY'S TREATMENT:  07-05-21: Pt and wife report increasing success in variety of communication events. Increasing effectiveness. Is IND using strategies of giving self extra time, alerting communication partners in beginning of conversation about occasional difficulties, describing when having trouble finding word. SLP provides additional education on strategies of writing down things he knows he'll want to talk about, asking for help when  needed. Overall, communication appears to be less of concern than was initially. Discussion on cognitive communication deficits which would be most beneficial for pt to address in future ST sessions. ID reading, writing, functional math (problem solving, alternating attention/working memory using calculator, sequencing steps), communicating figures to communication partners, alternating attention and working memory for computer related tasks at work. Plan to target skills in context of vocational skills pt will need for return to work. Goals updated to reflect pt's priorities for rehabilitation and visits added to pt's POC.   07-03-21: Pt reports frustration with tasks previously done effortlessly, such as driving, balancing checkbook, managing schedule. Suspect cognitive deficits persist following CVA which are just not presenting as pt returns to completing previously done activities. Demonstrating errors with reading, mental, written, and calculator math. Language appears to be less impaired by word finding and more so by attention, processing, and organization of complex thoughts. Examined math abilities with mod-complex subtraction. Max-A for calculator usage, pt hitting incorrect functions, difficulty sequencing. Using written implement, able to accurately subtract 2 digit from 3 digit x3 using carryover technique with initial verbal A. Education on strategies of time pressure management, avoiding multi-tasking, and double checking work as progress. Pt verbalizes understanding.   06-29-21: Improvements in verbal expression exhibited and reported, with pt exhibiting anomia x1 and occasional pausing. Some reduced conversation cohesion exhibited requiring SLP and/or wife clarification. Pt endorsed difficulty with reading closed captioning on television due to slow processing speed. SLP assessed reading today, with delays and frequent errors noted (omissions, substitutions, difficulty with word recognition) at  sentence and paragraph levels. Inconsistent error awareness exhibited. Reading comprehension of paragraph required several repetitions for accuracy. SLP recommended oral reading at least 5 minutes per day and completing HEP from last session with primary SLP.   06-22-21: Education on physical exercise benefits on brain health. Education on home practice for reading and spelling. Provided recommendations for expanding communication opportunities, to which pt is reserved but agreeable. Pt tells ST he feels like he is making small improvements day by day and continues to be pleased with his progress in speech therapy. Target receptive and verbal language expression through verbally interpreting abstract phrases and sentences. 11/15 accuracy.    PATIENT EDUCATION: Education details: see above Person educated: Patient and Spouse Education method: Explanation, Demonstration, and Handouts Education comprehension: verbalized understanding, returned demonstration, and needs further education  HOME EXERCISE PROGRAM: Problem Solving Approach to Spelling worksheet   GOALS: Goals reviewed with patient? Yes   SHORT TERM GOALS: Target date: 08/03/2021 (+4 weeks for new goals added at re-cert)    Pt will complete communication PROM during first session.  Baseline: CPIB=16 Goal status: MET    2.  Pt will successfully complete language tasks (e.g. SFA, VNeST) with 80% accuracy with rare min-A  Baseline: 06-06-2021 Goal status: MET   3.  Pt will use trained anomia strategies when answering mod-complex questions in 80% of trials  with occasional min-A over 2 sessions Baseline: 06-06-2021 Goal status: MET   4.  Pt will name 7+ items in personally relevant categories with rare min A over 2 sessions Baseline: 05-16-21, 06-06-2021 Goal status: MET  5.  Pt will demonstrate adequate working memory and alternating attention for completion of mod-complex, functional math problems (adding totals on invoice, balancing  checkbook) in 80% of opportunities with occasional mod-A over 2 sessions.  Baseline:  Goal status: NEW GOAL  6.  Using targeted strategies and compensations, pt will read multi-sentence stimuli, and summarize accurately in 8/10 trials with occasional min-A over 2 sessions.  Baseline:  Goal status: NEW GOAL     LONG TERM GOALS: Target date: 08/17/2021 (+6 weeks for re-certification)    Pt will report completion of aphasia HEP with mod-I over 1 week period Baseline: 07-03-21, 07-05-21 Goal status: met   2.  Pt will demonstrate usage of trained anomia strategies in 15+ minute conversation to improve functional communication over 2 sessions Baseline: 07-03-21, 07-05-21 Goal status: met   3.  Pt will report improved confidence in communication abilities via PROM by d/c Baseline:  Goal status: ongoing  4.  Pt will utilize trained compensations and strategies to read mod-complex reading passage, not greater than 1 page, and summarize reading to SLP with rare min-A over 2 sessions.  Baseline:  Goal status: NEW GOAL  5.  Pt will write work-related words, using accurate or phonetic spelling such that SLP can accurately decode, in 18/20 trials with rate min-A over 2 sessions.  Baseline:  Goal status: NEW GOAL  6. Pt will report implementation of trained compensations and strategies at home, resulting in subjective 50% improvement in abilities to complete personally relevant reading, writing, and math activities over 1 week period.  Baseline:  Goal status: NEW GOAL     ASSESSMENT:   CLINICAL IMPRESSION: Patient is a 61 y.o. M who was initial seen and evaluated for expressive aphasia. Pt continues to present with mild aphasia notable for difficulty with mod-complex verbal expression, occasional anomia, mazing. Less frequent vague speech and word finding difficulties observed in structured tasks and in conversational speech. Conducted ongoing education and training of anomia strategies to aid  word finding on structured tasks and in conversation. Targeted reading today as this is a reported functional decline. I recommend skilled ST to address expressive aphasia.    OBJECTIVE IMPAIRMENTS include aphasia. These impairments are limiting patient from effectively communicating at home and in community. No overt factors affecting potential to achieve goals and functional outcomes. Patient will benefit from skilled SLP services to address above impairments and improve overall function.   REHAB POTENTIAL: Excellent   PLAN: SLP FREQUENCY: 2x/week   SLP DURATION: 14 weeks (including +6 weeks for recertification)     PLANNED INTERVENTIONS: Language facilitation, Cueing hierachy, Cognitive reorganization, Internal/external aids, Functional tasks, SLP instruction and feedback, Compensatory strategies, and Patient/family education   Su Monks, CCC-SLP 07/06/2021, 10:23 AM

## 2021-07-09 ENCOUNTER — Ambulatory Visit: Payer: BC Managed Care – PPO

## 2021-07-09 DIAGNOSIS — R4701 Aphasia: Secondary | ICD-10-CM

## 2021-07-09 DIAGNOSIS — R41841 Cognitive communication deficit: Secondary | ICD-10-CM | POA: Diagnosis not present

## 2021-07-09 NOTE — Therapy (Signed)
OUTPATIENT SPEECH LANGUAGE PATHOLOGY TREATMENT NOTE    Patient Name: [name] MRN: [id number] DOB:27-Jan-1960, 61 y.o., male Today's Date: 07/09/2021  PCP: [name], MD REFERRING PROVIDER: [name], MD  END OF SESSION:   End of Session - 07/09/21 1437     Visit Number 13    Number of Visits 24    Date for SLP Re-Evaluation 08/17/21    Authorization Type BCBS    SLP Start Time 1440    SLP Stop Time  1525    SLP Time Calculation (min) 45 min    Activity Tolerance Patient tolerated treatment well                Past Medical History:  Diagnosis Date   Diabetic neuropathy ([place])    Diverticulitis    History of hepatitis C    has been treated in the past   History of kidney stones    Hyperlipidemia    Hypertension    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[place])    Other testicular hypofunction    Severe sepsis ([place]) 08/26/2019   Type II or unspecified type diabetes mellitus without mention of complication, not stated as uncontrolled    Vitamin D deficiency    Past Surgical History:  Procedure Laterality Date   AMPUTATION Right 09/01/2019   Procedure: RIGHT BELOW KNEE AMPUTATION;  Surgeon: Newt Minion, MD;  Location: [facility];  Service: Orthopedics;  Laterality: Right;   CHOLECYSTECTOMY  1987   COLONOSCOPY     I & D EXTREMITY Right 08/20/2019   Procedure: IRRIGATION & DEBRIDEMENT RIGHT FOOT PARTIAL CALCANEAL EXCISION;  Surgeon: Newt Minion, MD;  Location: [facility];  Service: Orthopedics;  Laterality: Right;   I & D EXTREMITY Right 08/27/2019   Procedure: IRRIGATION AND DEBRIDEMENT  OF FOOT;  Surgeon: Leandrew Koyanagi, MD;  Location: [facility];  Service: Orthopedics;  Laterality: Right;   ORIF TIBIA FRACTURE     x 3 right leg   Patient Active Problem List   Diagnosis Date Noted   Cerebral thrombosis with cerebral infarction 05/09/2021   Aphasia 05/08/2021   Leukocytosis 05/08/2021   Right below-knee amputee ([place]) 09/03/2019   Hyponatremia 08/26/2019   Cutaneous  abscess of right foot    Poorly controlled diabetes mellitus ([place]) 07/16/2019   FHx: heart disease 10/08/2017   Type 2 diabetes mellitus with stage 2 chronic kidney disease, without long-term current use of insulin ([place]) 10/08/2017   Insomnia 07/08/2017   Smoker 08/17/2015   COPD (chronic obstructive pulmonary disease) ([place]) 08/17/2015   T2_NIDDM w/Peripheral Neuropathy 04/01/2014   CKD stage 2 due to type 2 diabetes mellitus ([place]) 08/31/2013   Medication management 05/19/2013   Morbid obesity (BMI 35) 05/19/2013   Essential hypertension    Hyperlipidemia associated with type 2 diabetes mellitus ([place])    Vitamin D deficiency    History of kidney stones    Testosterone deficiency    History of hepatitis C     ONSET DATE: 05/08/2021  REFERRING DIAG: I63.9 (ICD-10-CM) - Acute CVA (cerebrovascular accident) ([place])   PERTINENT HISTORY: [name] is a 61 y.o. male admitted to hospital 4/18-19/2023 d/t sudden onset of word finding difficulty.  CT head was done which revealed old left MCA territory infarcts.  MRI brain was completed which showed a moderate area of left frontal lobe acute infarction in addition to the chronic infarcts in the left frontoparietal regions. PMHx singnificant for diabetes, hepatitis C, hypertension, hyperlipidemia.   THERAPY DIAG:  Cognitive communication deficit  Aphasia  SUBJECTIVE: "everything is going well"   PAIN:  Are you having pain? No  OBJECTIVE:   TODAY'S TREATMENT:  07-09-21: Pt reports improvements in verbal expression and overall functioning, with recent increased focus on hydration and supplementation of electrolytes. Pt did not complete homework but did complete yard work, which he hadn't accomplished since stroke. Today, SLP targeted functional math calculations, in which pt completed with 90% accuracy given occasional min A for error awareness for math language and reading accuracy. Pt able to ID majority of errors during calculations without  cues. SLP educated patient on utilizing slow rate and reading aloud to aid attention and accuracy, which was effective. Updated HEP to include functional math calculations as this is highly important for successful return to work.   07-05-21: Pt and wife report increasing success in variety of communication events. Increasing effectiveness. Is IND using strategies of giving self extra time, alerting communication partners in beginning of conversation about occasional difficulties, describing when having trouble finding word. SLP provides additional education on strategies of writing down things he knows he'll want to talk about, asking for help when needed. Overall, communication appears to be less of concern than was initially. Discussion on cognitive communication deficits which would be most beneficial for pt to address in future ST sessions. ID reading, writing, functional math (problem solving, alternating attention/working memory using calculator, sequencing steps), communicating figures to communication partners, alternating attention and working memory for computer related tasks at work. Plan to target skills in context of vocational skills pt will need for return to work. Goals updated to reflect pt's priorities for rehabilitation and visits added to pt's POC.   07-03-21: Pt reports frustration with tasks previously done effortlessly, such as driving, balancing checkbook, managing schedule. Suspect cognitive deficits persist following CVA which are just not presenting as pt returns to completing previously done activities. Demonstrating errors with reading, mental, written, and calculator math. Language appears to be less impaired by word finding and more so by attention, processing, and organization of complex thoughts. Examined math abilities with mod-complex subtraction. Max-A for calculator usage, pt hitting incorrect functions, difficulty sequencing. Using written implement, able to accurately  subtract 2 digit from 3 digit x3 using carryover technique with initial verbal A. Education on strategies of time pressure management, avoiding multi-tasking, and double checking work as progress. Pt verbalizes understanding.   06-29-21: Improvements in verbal expression exhibited and reported, with pt exhibiting anomia x1 and occasional pausing. Some reduced conversation cohesion exhibited requiring SLP and/or wife clarification. Pt endorsed difficulty with reading closed captioning on television due to slow processing speed. SLP assessed reading today, with delays and frequent errors noted (omissions, substitutions, difficulty with word recognition) at sentence and paragraph levels. Inconsistent error awareness exhibited. Reading comprehension of paragraph required several repetitions for accuracy. SLP recommended oral reading at least 5 minutes per day and completing HEP from last session with primary SLP.    PATIENT EDUCATION: Education details: see above Person educated: Patient and Spouse Education method: Explanation, Demonstration, and Handouts Education comprehension: verbalized understanding, returned demonstration, and needs further education  HOME EXERCISE PROGRAM: Problem Solving Approach to Spelling worksheet   GOALS: Goals reviewed with patient? Yes   SHORT TERM GOALS: Target date: 08/03/2021 (+4 weeks for new goals added at re-cert)    Pt will complete communication PROM during first session.  Baseline: CPIB=16 Goal status: MET    2.  Pt will successfully complete language tasks (e.g. SFA, VNeST) with  80% accuracy with rare min-A  Baseline: 06-06-2021 Goal status: MET   3.  Pt will use trained anomia strategies when answering mod-complex questions in 80% of trials with occasional min-A over 2 sessions Baseline: 06-06-2021 Goal status: MET   4.  Pt will name 7+ items in personally relevant categories with rare min A over 2 sessions Baseline: 05-16-21, 06-06-2021 Goal status:  MET  5.  Pt will demonstrate adequate working memory and alternating attention for completion of mod-complex, functional math problems (adding totals on invoice, balancing checkbook) in 80% of opportunities with occasional mod-A over 2 sessions.  Baseline: 07-09-21 Goal status: ongoing (added at recert)  6.  Using targeted strategies and compensations, pt will read multi-sentence stimuli, and summarize accurately in 8/10 trials with occasional min-A over 2 sessions.  Baseline:  Goal status: ongoing (added at recert)     LONG TERM GOALS: Target date: 08/17/2021 (+6 weeks for re-certification)    Pt will report completion of aphasia HEP with mod-I over 1 week period Baseline: 07-03-21, 07-05-21 Goal status: met   2.  Pt will demonstrate usage of trained anomia strategies in 15+ minute conversation to improve functional communication over 2 sessions Baseline: 07-03-21, 07-05-21 Goal status: met   3.  Pt will report improved confidence in communication abilities via PROM by d/c Baseline:  Goal status: ongoing  4.  Pt will utilize trained compensations and strategies to read mod-complex reading passage, not greater than 1 page, and summarize reading to SLP with rare min-A over 2 sessions.  Baseline:  Goal status: ongoing (added at recert)  5.  Pt will write work-related words, using accurate or phonetic spelling such that SLP can accurately decode, in 18/20 trials with rate min-A over 2 sessions.  Baseline:  Goal status: ongoing (added at recert)  6. Pt will report implementation of trained compensations and strategies at home, resulting in subjective 50% improvement in abilities to complete personally relevant reading, writing, and math activities over 1 week period.  Baseline:  Goal status: ongoing (added at recert)     ASSESSMENT:   CLINICAL IMPRESSION: Patient is a 61 y.o. M who was initial seen and evaluated for expressive aphasia. Pt continues to present with mild aphasia notable  for difficulty with mod-complex verbal expression, occasional anomia, mazing. Less frequent vague speech and word finding difficulties observed in structured tasks and in conversational speech. Conducted ongoing education and training of anomia strategies to aid word finding on structured tasks and in conversation. Targeted functional math calculations with occasional min A provided to aid attention and accuracy. I recommend skilled ST to address cognitive communication skills to maximize return to baseline.    OBJECTIVE IMPAIRMENTS include aphasia. These impairments are limiting patient from effectively communicating at home and in community. No overt factors affecting potential to achieve goals and functional outcomes. Patient will benefit from skilled SLP services to address above impairments and improve overall function.   REHAB POTENTIAL: Excellent   PLAN: SLP FREQUENCY: 2x/week   SLP DURATION: 14 weeks (including +6 weeks for recertification)     PLANNED INTERVENTIONS: Language facilitation, Cueing hierachy, Cognitive reorganization, Internal/external aids, Functional tasks, SLP instruction and feedback, Compensatory strategies, and Patient/family education   Marzetta Board, CCC-SLP 07/09/2021, 3:29 PM

## 2021-07-11 ENCOUNTER — Other Ambulatory Visit: Payer: Self-pay

## 2021-07-11 ENCOUNTER — Ambulatory Visit: Payer: BC Managed Care – PPO | Admitting: Speech Pathology

## 2021-07-11 DIAGNOSIS — R4701 Aphasia: Secondary | ICD-10-CM | POA: Diagnosis not present

## 2021-07-11 DIAGNOSIS — R41841 Cognitive communication deficit: Secondary | ICD-10-CM | POA: Diagnosis not present

## 2021-07-11 MED ORDER — PANTOPRAZOLE SODIUM 40 MG PO TBEC: 40.0000 mg | DELAYED_RELEASE_TABLET | Freq: Every day | ORAL | 1 refills | Status: DC

## 2021-07-11 NOTE — Therapy (Signed)
OUTPATIENT SPEECH LANGUAGE PATHOLOGY TREATMENT NOTE    Patient Name: [name] MRN: [id number] DOB:1960/05/27, 61 y.o., male Today's Date: 07/11/2021  PCP: Michael Pinto, MD REFERRING PROVIDER: [name], MD  END OF SESSION:   End of Session - 07/11/21 1444     Visit Number 14    Number of Visits 24    Date for SLP Re-Evaluation 08/17/21    Authorization Type BCBS    SLP Start Time 1444    SLP Stop Time  9728    SLP Time Calculation (min) 46 min    Activity Tolerance Patient tolerated treatment well                 Past Medical History:  Diagnosis Date   Diabetic neuropathy ([place])    Diverticulitis    History of hepatitis C    has been treated in the past   History of kidney stones    Hyperlipidemia    Hypertension    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[place])    Other testicular hypofunction    Severe sepsis ([place]) 08/26/2019   Type II or unspecified type diabetes mellitus without mention of complication, not stated as uncontrolled    Vitamin D deficiency    Past Surgical History:  Procedure Laterality Date   AMPUTATION Right 09/01/2019   Procedure: RIGHT BELOW KNEE AMPUTATION;  Surgeon: Newt Minion, MD;  Location: [facility];  Service: Orthopedics;  Laterality: Right;   CHOLECYSTECTOMY  1987   COLONOSCOPY     I & D EXTREMITY Right 08/20/2019   Procedure: IRRIGATION & DEBRIDEMENT RIGHT FOOT PARTIAL CALCANEAL EXCISION;  Surgeon: Newt Minion, MD;  Location: [facility];  Service: Orthopedics;  Laterality: Right;   I & D EXTREMITY Right 08/27/2019   Procedure: IRRIGATION AND DEBRIDEMENT  OF FOOT;  Surgeon: Leandrew Koyanagi, MD;  Location: [facility];  Service: Orthopedics;  Laterality: Right;   ORIF TIBIA FRACTURE     x 3 right leg   Patient Active Problem List   Diagnosis Date Noted   Cerebral thrombosis with cerebral infarction 05/09/2021   Aphasia 05/08/2021   Leukocytosis 05/08/2021   Right below-knee amputee ([place]) 09/03/2019   Hyponatremia 08/26/2019    Cutaneous abscess of right foot    Poorly controlled diabetes mellitus ([place]) 07/16/2019   FHx: heart disease 10/08/2017   Type 2 diabetes mellitus with stage 2 chronic kidney disease, without long-term current use of insulin ([place]) 10/08/2017   Insomnia 07/08/2017   Smoker 08/17/2015   COPD (chronic obstructive pulmonary disease) ([place]) 08/17/2015   T2_NIDDM w/Peripheral Neuropathy 04/01/2014   CKD stage 2 due to type 2 diabetes mellitus ([place]) 08/31/2013   Medication management 05/19/2013   Morbid obesity (BMI 35) 05/19/2013   Essential hypertension    Hyperlipidemia associated with type 2 diabetes mellitus ([place])    Vitamin D deficiency    History of kidney stones    Testosterone deficiency    History of hepatitis C     ONSET DATE: 05/08/2021  REFERRING DIAG: I63.9 (ICD-10-CM) - Acute CVA (cerebrovascular accident) ([place])   PERTINENT HISTORY: [name] is a 61 y.o. male admitted to hospital 4/18-19/2023 d/t sudden onset of word finding difficulty.  CT head was done which revealed old left MCA territory infarcts.  MRI brain was completed which showed a moderate area of left frontal lobe acute infarction in addition to the chronic infarcts in the left frontoparietal regions. PMHx singnificant for diabetes, hepatitis C, hypertension, hyperlipidemia.   THERAPY  DIAG: Cognitive communication deficit  Aphasia  SUBJECTIVE: "I've been doing homework"   PAIN:  Are you having pain? No  OBJECTIVE:   TODAY'S TREATMENT:  07-11-21: Target attention to detail, working memory, divided attention, and comprehension in invoice task simulating work requirements. Pt was provided with direct instruction on strategies which can aid in successful completion of task including repeating stimuli during calculation, practicing saying, looking, then pressing in calculator use, repeating back cost to distributor (SLP), double checking work, and verbally working thorough what math manipulation was required of  current step. Verbalizes understanding of strategies, requires usual mod-A to utilize during simulation. Demonstrating IND use of double checking. Numerous calculation errors d/t decreased working memory or attention; approx 60% accurate completion of calculations. Some demonstrated difficulty understanding what calculation is required for percentage to mark up cost to consumer.   07-09-21: Pt reports improvements in verbal expression and overall functioning, with recent increased focus on hydration and supplementation of electrolytes. Pt did not complete homework but did complete yard work, which he hadn't accomplished since stroke. Today, SLP targeted functional math calculations, in which pt completed with 90% accuracy given occasional min A for error awareness for math language and reading accuracy. Pt able to ID majority of errors during calculations without cues. SLP educated patient on utilizing slow rate and reading aloud to aid attention and accuracy, which was effective. Updated HEP to include functional math calculations as this is highly important for successful return to work.   07-05-21: Pt and wife report increasing success in variety of communication events. Increasing effectiveness. Is IND using strategies of giving self extra time, alerting communication partners in beginning of conversation about occasional difficulties, describing when having trouble finding word. SLP provides additional education on strategies of writing down things he knows he'll want to talk about, asking for help when needed. Overall, communication appears to be less of concern than was initially. Discussion on cognitive communication deficits which would be most beneficial for pt to address in future ST sessions. ID reading, writing, functional math (problem solving, alternating attention/working memory using calculator, sequencing steps), communicating figures to communication partners, alternating attention and working  memory for computer related tasks at work. Plan to target skills in context of vocational skills pt will need for return to work. Goals updated to reflect pt's priorities for rehabilitation and visits added to pt's POC.    PATIENT EDUCATION: Education details: see above Person educated: Patient and Spouse Education method: Explanation, Demonstration, and Handouts Education comprehension: verbalized understanding, returned demonstration, and needs further education  HOME EXERCISE PROGRAM: Artist, utilizing taught strategies   GOALS: Goals reviewed with patient? Yes   SHORT TERM GOALS: Target date: 08/03/2021 (+4 weeks for new goals added at re-cert)    Pt will complete communication PROM during first session.  Baseline: CPIB=16 Goal status: MET    2.  Pt will successfully complete language tasks (e.g. SFA, VNeST) with 80% accuracy with rare min-A  Baseline: 06-06-2021 Goal status: MET   3.  Pt will use trained anomia strategies when answering mod-complex questions in 80% of trials with occasional min-A over 2 sessions Baseline: 06-06-2021 Goal status: MET   4.  Pt will name 7+ items in personally relevant categories with rare min A over 2 sessions Baseline: 05-16-21, 06-06-2021 Goal status: MET  5.  Pt will demonstrate adequate working memory and alternating attention for completion of mod-complex, functional math problems (adding totals on invoice, balancing checkbook) in 80% of opportunities with occasional  mod-A over 2 sessions.  Baseline: 07-09-21 Goal status: ongoing (added at recert)  6.  Using targeted strategies and compensations, pt will read multi-sentence stimuli, and summarize accurately in 8/10 trials with occasional min-A over 2 sessions.  Baseline:  Goal status: ongoing (added at recert)     LONG TERM GOALS: Target date: 08/17/2021 (+6 weeks for re-certification)    Pt will report completion of aphasia HEP with mod-I over 1 week period Baseline:  07-03-21, 07-05-21 Goal status: met   2.  Pt will demonstrate usage of trained anomia strategies in 15+ minute conversation to improve functional communication over 2 sessions Baseline: 07-03-21, 07-05-21 Goal status: met   3.  Pt will report improved confidence in communication abilities via PROM by d/c Baseline:  Goal status: ongoing  4.  Pt will utilize trained compensations and strategies to read mod-complex reading passage, not greater than 1 page, and summarize reading to SLP with rare min-A over 2 sessions.  Baseline:  Goal status: ongoing (added at recert)  5.  Pt will write work-related words, using accurate or phonetic spelling such that SLP can accurately decode, in 18/20 trials with rate min-A over 2 sessions.  Baseline:  Goal status: ongoing (added at recert)  6. Pt will report implementation of trained compensations and strategies at home, resulting in subjective 50% improvement in abilities to complete personally relevant reading, writing, and math activities over 1 week period.  Baseline:  Goal status: ongoing (added at recert)     ASSESSMENT:   CLINICAL IMPRESSION: Patient is a 61 y.o. M who was initial seen and evaluated for expressive aphasia. Pt continues to present with mild aphasia notable for difficulty with mod-complex verbal expression, occasional anomia, mazing. Less frequent vague speech and word finding difficulties observed in structured tasks and in conversational speech. Conducted ongoing education and training of anomia strategies to aid word finding on structured tasks and in conversation. Targeted functional math calculations with occasional min A provided to aid attention and accuracy. I recommend skilled ST to address cognitive communication skills to maximize return to baseline.    OBJECTIVE IMPAIRMENTS include aphasia. These impairments are limiting patient from effectively communicating at home and in community. No overt factors affecting potential to  achieve goals and functional outcomes. Patient will benefit from skilled SLP services to address above impairments and improve overall function.   REHAB POTENTIAL: Excellent   PLAN: SLP FREQUENCY: 2x/week   SLP DURATION: 14 weeks (including +6 weeks for recertification)     PLANNED INTERVENTIONS: Language facilitation, Cueing hierachy, Cognitive reorganization, Internal/external aids, Functional tasks, SLP instruction and feedback, Compensatory strategies, and Patient/family education   Su Monks, CCC-SLP 07/11/2021, 3:35 PM

## 2021-07-18 ENCOUNTER — Ambulatory Visit: Payer: BC Managed Care – PPO | Admitting: Speech Pathology

## 2021-07-18 DIAGNOSIS — R41841 Cognitive communication deficit: Secondary | ICD-10-CM | POA: Diagnosis not present

## 2021-07-18 DIAGNOSIS — R4701 Aphasia: Secondary | ICD-10-CM | POA: Diagnosis not present

## 2021-07-18 NOTE — Progress Notes (Signed)
Annual  Screening/Preventative Visit  & Comprehensive Evaluation & Examination  Future Appointments  Date Time Provider Department  07/19/2021  3:00 PM Lucky Cowboy, MD [facility]  12/17/2021 10:00 AM Shawnie Dapper, NP [facility]  07/24/2022  3:00 PM Lucky Cowboy, MD [facility]            This very nice 61 y.o. MWM presents for a Screening /Preventative Visit & comprehensive evaluation and management of multiple medical co-morbidities.  Patient has been followed for HTN, HLD, T2_NIDDM  and Vitamin D Deficiency.       HTN predates since 60.  Patient's BP has been controlled at home.  Today's BP is  at goal  - 119/60 . Patient denies any cardiac symptoms as chest pain, palpitations, shortness of breath, dizziness or ankle swelling.       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 Patient has been out of work during that time .         Patient's hyperlipidemia is controlled with diet and medications. Patient denies myalgias or other medication SE's. Last lipids were at goal except elevated Trig's:  Lab Results  Component Value Date   CHOL 88 05/09/2021   HDL 20 (L) 05/09/2021   LDLCALC 23 05/09/2021   TRIG 223 (H) 05/09/2021   CHOLHDL 4.4 05/09/2021                                            Patient has hx/o Low Testosterone Deficiency  for several years & he has declined replacement therapy.      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 Last A1c was not at goal:   Lab Results  Component Value Date   HGBA1C 6.2 (H) 07/19/2020        Finally, patient has history of Vitamin D Deficiency ("24" /2008) and last vitamin D was at goal:   Lab Results  Component Value Date   VD25OH 67 01/18/2021     Current  Outpatient Medications on File Prior to Visit  Medication Sig   aspirin 81 MG tablet Take daily.   citalopram  40 MG tablet Take  1 tablet  Daily    Hctz  25 MG tablet Take  1 tablet  Daily    lisinopril 20 MG tablet Take 1 tablet  Daily   metFORMIN-XR 500 MG  Take  2 tablets  2 x /day  with Meals     rosuvastatin  20 MG tablet Take 20 mg three days a week    Verapamil-SR 240 MG  Take 1 tablet Daily     Allergies  Allergen Reactions   Invokamet  Extremity edema/caused pain   Invokana [Canagliflozin] Extremity edema/caused pain   Codeine  Rash   Morphine And Related Rash     Past Medical History:  Diagnosis Date   Diabetic neuropathy ([name])    Diverticulitis    History of hepatitis C    has been treated in the past   History of kidney stones    Hyperlipidemia    Hypertension    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[name])    Other testicular  hypofunction    Severe sepsis ([name]) 08/26/2019   Type II  diabetes mellitus     Vitamin D deficiency      Health Maintenance  Topic Date Due   PNEUMOCOCCAL VACCINE  Never done   COVID-19 Vaccine (1) Never done   Pneumococcal Vaccine  Never done   OPHTHALMOLOGY EXAM  Never done   Zoster Vaccines- Shingrix (1 of 2) Never done   COLONOSCOPY  Never done   INFLUENZA VACCINE  08/21/2020   HEMOGLOBIN A1C  01/18/2021   FOOT EXAM  07/19/2021   TETANUS/TDAP  12/26/2026   Hepatitis C Screening  Completed   HIV Screening  Completed   HPV VACCINES  Aged Out     Immunization History  Administered Date(s) Administered   PPD Test 04/03/2017, 07/14/2018, 07/19/2019   Td 01/21/2005   Tdap 12/25/2016    Last Colon - 2007 - Dr Neomia Glass - overdue 10 yr f/u (Patient aware overdue)     Past Surgical History:  Procedure Laterality Date   AMPUTATION Right 09/01/2019   Procedure: RIGHT BELOW KNEE AMPUTATION;  Surgeon: Nadara Mustard, MD;  Location: [facility];  Service: Orthopedics;  Laterality: Right;   CHOLECYSTECTOMY  1987   COLONOSCOPY     I & D  EXTREMITY Right 08/20/2019   Procedure: IRRIGATION & DEBRIDEMENT RIGHT FOOT PARTIAL CALCANEAL EXCISION;  Surgeon: Nadara Mustard, MD;  Location: [facility];  Service: Orthopedics;  Laterality: Right;   I & D EXTREMITY Right 08/27/2019   Procedure: IRRIGATION AND DEBRIDEMENT  OF FOOT;  Surgeon: Tarry Kos, MD;  Location: [facility];  Service: Orthopedics;  Laterality: Right;   ORIF TIBIA FRACTURE     x 3 right leg     Family History  Problem Relation Age of Onset   Hypertension Mother    Cancer Father        colon   Alzheimer's disease Father    Stroke Father     Social History   Socioeconomic History   Marital status: Married    Spouse name: Not on file   Number of children: Not on file  Occupational History   Not on file  Tobacco Use   Smoking status: Every Day    Packs/day: 1.00    Pack years: 0.00    Types: Cigarettes   Smokeless tobacco: Never  Substance and Sexual Activity   Alcohol use: No   Drug use: Yes    Types: Marijuana   Sexual activity: Not on file      ROS Constitutional: Denies fever, chills, weight loss/gain, headaches, insomnia,  night sweats or change in appetite. Does c/o fatigue. Eyes: Denies redness, blurred vision, diplopia, discharge, itchy or watery eyes.  ENT: Denies discharge, congestion, post nasal drip, epistaxis, sore throat, earache, hearing loss, dental pain, Tinnitus, Vertigo, Sinus pain or snoring.  Cardio: Denies chest pain, palpitations, irregular heartbeat, syncope, dyspnea, diaphoresis, orthopnea, PND, claudication or edema Respiratory: denies cough, dyspnea, DOE, pleurisy, hoarseness, laryngitis or wheezing.  Gastrointestinal: Denies dysphagia, heartburn, reflux, water brash, pain, cramps, nausea, vomiting, bloating, diarrhea, constipation, hematemesis, melena, hematochezia, jaundice or hemorrhoids Genitourinary: Denies dysuria, frequency, urgency, nocturia, hesitancy, discharge, hematuria or flank pain Musculoskeletal: Denies arthralgia,  myalgia, stiffness, Jt. Swelling, pain, limp or strain/sprain. Denies Falls. Skin: Denies puritis, rash, hives, warts, acne, eczema or change in skin lesion Neuro: No weakness, tremor, incoordination, spasms, paresthesia or pain Psychiatric: Denies confusion, memory loss or sensory loss. Denies Depression. Endocrine: Denies change in weight, skin, hair change,  nocturia, and paresthesia, diabetic polys, visual blurring or hyper / hypo glycemic episodes.  Heme/Lymph: No excessive bleeding, bruising or enlarged lymph nodes.   Physical Exam  BP 119/60   Pulse 74   Temp (!) 96.6 F (35.9 C)   Ht 6\' 2"  (1.88 m)   Wt 264 lb (119.7 kg)   SpO2 96%   BMI 33.90 kg/m   General Appearance: Over nourished and well groomed and in no apparent distress.  Eyes: PERRLA, EOMs, conjunctiva no swelling or erythema, normal fundi and vessels. Sinuses: No frontal/maxillary tenderness ENT/Mouth: EACs patent / TMs  nl. Nares clear without erythema, swelling, mucoid exudates. Oral hygiene is good. No erythema, swelling, or exudate. Tongue normal, non-obstructing. Tonsils not swollen or erythematous. Hearing normal.  Neck: Supple, thyroid not palpable. No bruits, nodes or JVD. Respiratory: Respiratory effort normal.  BS equal and clear bilateral without rales, rhonci, wheezing or stridor. Cardio: Heart sounds are normal with regular rate and rhythm and no murmurs, rubs or gallops. Peripheral pulses are normal and equal bilaterally without edema. No aortic or femoral bruits. Chest: symmetric with normal excursions and percussion.  Abdomen: Soft, with Nl bowel sounds. Nontender, no guarding, rebound, hernias, masses, or organomegaly.  Lymphatics: Non tender without lymphadenopathy.  Musculoskeletal: [name].   Skin: Warm and dry without rashes, lesions, cyanosis, clubbing or  ecchymosis.  Neuro: Cranial nerves intact, reflexes equal bilaterally. Normal muscle tone, no cerebellar symptoms. Sensation decreased to  touch, vibratory and Monofilament to the lt. toes.  Pysch: Alert and oriented X 3 with normal affect, insight and judgment appropriate.   Assessment and Plan  1. Annual Preventative/Screening Exam    2. Essential hypertension  - EKG 12-Lead - [place], RETROPERITNL ABD,  LTD - Urinalysis, Routine w reflex microscopic - Microalbumin / creatinine urine ratio - CBC with Differential/Platelet - COMPLETE METABOLIC PANEL WITH GFR - Magnesium - TSH  3. Hyperlipidemia associated with type 2 diabetes mellitus ([place])  - EKG 12-Lead - [place], RETROPERITNL ABD,  LTD - Lipid panel - TSH  4. Type 2 diabetes mellitus with stage 2 chronic kidney disease ([name])  - EKG 12-Lead - [place], RETROPERITNL ABD,  LTD - Hemoglobin A1c - Insulin, random - Dulaglutide (TRULICITY) 1.5 0000000 SOPN; Inject 1.5 mg into skin each week  Dispense: 2 mL; Refill: 0  5. T2_NIDDM w/Peripheral Neuropathy  - HM DIABETES FOOT EXAM - Hemoglobin A1c - Insulin, random - Dulaglutide (TRULICITY) 1.5 0000000;  Inject 1.5 mg into skin each week   Dispense: 2 mL; Refill: 0  6. Vitamin D deficiency  - VITAMIN D 25 Hydroxy   7. Right below-knee amputee ([place])   8. Screening for colorectal cancer  - POC Hemoccult Bld/Stl   9. Screening examination for pulmonary tuberculosis  - TB Skin Test  10. Prostate cancer screening  - PSA  11. Testosterone deficiency  - Testosterone  12. Screening for heart disease  - EKG 12-Lead  13. FHx: heart disease  - EKG 12-Lead - [place], RETROPERITNL ABD,  LTD  14. Smoker  - EKG 12-Lead - [place], RETROPERITNL ABD,  LTD  15. Fatigue, unspecified type  - Iron, Total/Total Iron Binding Cap - Vitamin B12 - Testosterone - CBC with Differential/Platelet - TSH  16. Medication management  - Urinalysis, Routine w reflex microscopic - Microalbumin / creatinine urine ratio - CBC with Differential/Platelet - COMPLETE METABOLIC PANEL WITH GFR - Magnesium - Lipid panel - TSH -  Hemoglobin A1c - Insulin, random - VITAMIN D 25 Hydroxy  Patient was counseled in prudent diet, weight control to achieve/maintain BMI less than 25, BP monitoring, regular exercise and medications as discussed.  Discussed med effects and SE's. Routine screening labs and tests as requested with regular follow-up as recommended. Over 40 minutes of exam, counseling, chart review and high complex critical decision making was performed   Kirtland Bouchard, MD

## 2021-07-18 NOTE — Therapy (Signed)
OUTPATIENT SPEECH LANGUAGE PATHOLOGY TREATMENT NOTE    Patient Name: [name] MRN: [id number] DOB:1960-02-19, 61 y.o., male Today's Date: 07/18/2021  PCP: Michael Pinto, MD REFERRING PROVIDER: [name], MD  END OF SESSION:   End of Session - 07/18/21 1435     Visit Number 15    Number of Visits 24    Date for SLP Re-Evaluation 08/17/21    Authorization Type BCBS    SLP Start Time 1435    SLP Stop Time  8127    SLP Time Calculation (min) 46 min    Activity Tolerance Patient tolerated treatment well                  Past Medical History:  Diagnosis Date   Diabetic neuropathy ([place])    Diverticulitis    History of hepatitis C    has been treated in the past   History of kidney stones    Hyperlipidemia    Hypertension    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[place])    Other testicular hypofunction    Severe sepsis ([place]) 08/26/2019   Type II or unspecified type diabetes mellitus without mention of complication, not stated as uncontrolled    Vitamin D deficiency    Past Surgical History:  Procedure Laterality Date   AMPUTATION Right 09/01/2019   Procedure: RIGHT BELOW KNEE AMPUTATION;  Surgeon: Michael Minion, MD;  Location: [facility];  Service: Orthopedics;  Laterality: Right;   CHOLECYSTECTOMY  1987   COLONOSCOPY     I & D EXTREMITY Right 08/20/2019   Procedure: IRRIGATION & DEBRIDEMENT RIGHT FOOT PARTIAL CALCANEAL EXCISION;  Surgeon: Michael Minion, MD;  Location: [facility];  Service: Orthopedics;  Laterality: Right;   I & D EXTREMITY Right 08/27/2019   Procedure: IRRIGATION AND DEBRIDEMENT  OF FOOT;  Surgeon: Michael Koyanagi, MD;  Location: [facility];  Service: Orthopedics;  Laterality: Right;   ORIF TIBIA FRACTURE     x 3 right leg   Patient Active Problem List   Diagnosis Date Noted   Cerebral thrombosis with cerebral infarction 05/09/2021   Aphasia 05/08/2021   Leukocytosis 05/08/2021   Right below-knee amputee ([place]) 09/03/2019   Hyponatremia 08/26/2019    Cutaneous abscess of right foot    Poorly controlled diabetes mellitus ([place]) 07/16/2019   FHx: heart disease 10/08/2017   Type 2 diabetes mellitus with stage 2 chronic kidney disease, without long-term current use of insulin ([place]) 10/08/2017   Insomnia 07/08/2017   Smoker 08/17/2015   COPD (chronic obstructive pulmonary disease) ([place]) 08/17/2015   T2_NIDDM w/Peripheral Neuropathy 04/01/2014   CKD stage 2 due to type 2 diabetes mellitus ([place]) 08/31/2013   Medication management 05/19/2013   Morbid obesity (BMI 35) 05/19/2013   Essential hypertension    Hyperlipidemia associated with type 2 diabetes mellitus ([place])    Vitamin D deficiency    History of kidney stones    Testosterone deficiency    History of hepatitis C     ONSET DATE: 05/08/2021  REFERRING DIAG: I63.9 (ICD-10-CM) - Acute CVA (cerebrovascular accident) ([place])   PERTINENT HISTORY: [name] is a 61 y.o. male admitted to hospital 4/18-19/2023 d/t sudden onset of word finding difficulty.  CT head was done which revealed old left MCA territory infarcts.  MRI brain was completed which showed a moderate area of left frontal lobe acute infarction in addition to the chronic infarcts in the left frontoparietal regions. PMHx singnificant for diabetes, hepatitis C, hypertension, hyperlipidemia.  THERAPY DIAG: Cognitive communication deficit  Aphasia  SUBJECTIVE: "I think I did ok"  PAIN:  Are you having pain? No  OBJECTIVE:   TODAY'S TREATMENT:  07-18-21: Pt able to ID errors on HEP given occasional min-A. Able to correct 2/2 errors with usual mod-A. Demonstrates difficulty in problem solving solution. Discussion on strategies and compensations to aid in successful completion of functional math. Pt ID take his time, slow down, say out loud. SLP adds limiting distractions, focus on . vs = buttons, double checking as he goes, limiting visual field as necessary. 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 Pt agrees that completing calculations following phone conversation, limiting external distractions, and budgeting appropriate time to complete will be beneficial to successful completion of invoicing at work. Additional recommendation regarding communicating accommodations to boss. Target attention and working memory in computer data entry task with pt demonstrating initial hesitation then performing 3/3 accurately given extended time. Will readdress next session, mimicking work flow from work.   07-11-21: Target attention to detail, working memory, divided attention, and comprehension in invoice task simulating work requirements. Pt was provided with direct instruction on strategies which can aid in successful completion of task including repeating stimuli during calculation, practicing saying, looking, then pressing in calculator use, repeating back cost to distributor (SLP), double checking work, and verbally working thorough what math manipulation was required of current step. Verbalizes understanding of strategies, requires usual mod-A to utilize during simulation. Demonstrating IND use of double checking. Numerous calculation errors d/t decreased working memory or attention; approx 60% accurate completion of calculations. Some demonstrated difficulty understanding what calculation is required for percentage to mark up cost to consumer.   07-09-21: Pt reports improvements in verbal expression and overall functioning, with recent increased focus on hydration and supplementation of electrolytes. Pt did not complete homework but did complete yard work, which he hadn't accomplished since stroke. Today, SLP targeted functional math calculations, in which pt completed with 90% accuracy given occasional min A for error awareness for math language and reading accuracy. Pt able to ID majority of errors during calculations without cues. SLP educated patient on  utilizing slow rate and reading aloud to aid attention and accuracy, which was effective. Updated HEP to include functional math calculations as this is highly important for successful return to work.   07-05-21: Pt and wife report increasing success in variety of communication events. Increasing effectiveness. Is IND using strategies of giving self extra time, alerting communication partners in beginning of conversation about occasional difficulties, describing when having trouble finding word. SLP provides additional education on strategies of writing down things he knows he'll want to talk about, asking for help when needed. Overall, communication appears to be less of concern than was initially. Discussion on cognitive communication deficits which would be most beneficial for pt to address in future ST sessions. ID reading, writing, functional math (problem solving, alternating attention/working memory using calculator, sequencing steps), communicating figures to communication partners, alternating attention and working memory for computer related tasks at work. Plan to target skills in context of vocational skills pt will need for return to work. Goals updated to reflect pt's priorities for rehabilitation and visits added to pt's POC.    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 Yes   SHORT TERM GOALS: Target date: 08/03/2021 (+4 weeks for new goals added at re-cert)    Pt will complete communication PROM during first session.  Baseline: CPIB=16 Goal status: MET    2.  Pt will successfully complete language tasks (e.g. SFA, VNeST) with 80% accuracy with rare min-A  Baseline:  06-06-2021 Goal status: MET   3.  Pt will use trained anomia strategies when answering mod-complex questions in 80% of trials with occasional min-A over 2 sessions Baseline: 06-06-2021 Goal status: MET   4.  Pt will name 7+ items in personally relevant categories with rare min A over 2 sessions Baseline: 05-16-21, 06-06-2021 Goal status: MET  5.  Pt will demonstrate adequate working memory and alternating attention for completion of mod-complex, functional math problems (adding totals on invoice, balancing checkbook) in 80% of opportunities with occasional mod-A over 2 sessions.  Baseline: 07-09-21 Goal status: ongoing (added at recert)  6.  Using targeted strategies and compensations, pt will read multi-sentence stimuli, and summarize accurately in 8/10 trials with occasional min-A over 2 sessions.  Baseline:  Goal status: ongoing (added at recert)     LONG TERM GOALS: Target date: 08/17/2021 (+6 weeks for re-certification)    Pt will report completion of aphasia HEP with mod-I over 1 week period Baseline: 07-03-21, 07-05-21 Goal status: met   2.  Pt will demonstrate usage of trained anomia strategies in 15+ minute conversation to improve functional communication over 2 sessions Baseline: 07-03-21, 07-05-21 Goal status: met   3.  Pt will report improved confidence in communication abilities via PROM by d/c Baseline:  Goal status: ongoing  4.  Pt will utilize trained compensations and strategies to read mod-complex reading passage, not greater than 1 page, and summarize reading to SLP with rare min-A over 2 sessions.  Baseline:  Goal status: ongoing (added at recert)  5.  Pt will write work-related words, using accurate or phonetic spelling such that SLP can accurately decode, in 18/20 trials with rate min-A over 2 sessions.  Baseline:  Goal status: ongoing (added at recert)  6. Pt will report implementation of trained compensations and strategies at home, resulting in subjective 50%  improvement in abilities to complete personally relevant reading, writing, and math activities over 1 week period.  Baseline:  Goal status: ongoing (added at recert)     ASSESSMENT:   CLINICAL IMPRESSION: Patient is a 61 y.o. M who was initial seen and evaluated for expressive aphasia. Pt continues to present with mild aphasia notable for difficulty with mod-complex verbal expression, occasional anomia, mazing. Less frequent vague speech and word finding difficulties observed in structured tasks and in conversational speech. Conducted ongoing education and training of anomia strategies to aid word finding on structured tasks and in conversation. Targeted functional math calculations with occasional min A provided to aid attention and accuracy. I recommend skilled ST to address cognitive communication skills to maximize return to baseline.    OBJECTIVE IMPAIRMENTS include aphasia. These impairments are limiting patient from effectively communicating at home and in community. No overt factors affecting potential to achieve goals and functional outcomes. Patient will benefit from skilled SLP services to address above impairments and improve overall function.   REHAB POTENTIAL: Excellent   PLAN: SLP FREQUENCY: 2x/week   SLP DURATION: 14 weeks (including +6 weeks for recertification)     PLANNED INTERVENTIONS: Language facilitation, Cueing hierachy, Cognitive reorganization, Internal/external aids, Functional tasks, SLP instruction and feedback, Compensatory strategies, and Patient/family  education   Su Monks, [place] 07/18/2021, 2:36 PM

## 2021-07-18 NOTE — Patient Instructions (Signed)

## 2021-07-19 ENCOUNTER — Ambulatory Visit (INDEPENDENT_AMBULATORY_CARE_PROVIDER_SITE_OTHER): Payer: BC Managed Care – PPO | Admitting: Internal Medicine

## 2021-07-19 ENCOUNTER — Encounter: Payer: Self-pay | Admitting: Internal Medicine

## 2021-07-19 VITALS — BP 119/60 | HR 74 | Temp 96.6°F | Ht 74.0 in | Wt 264.0 lb

## 2021-07-19 DIAGNOSIS — F17200 Nicotine dependence, unspecified, uncomplicated: Secondary | ICD-10-CM

## 2021-07-19 DIAGNOSIS — E559 Vitamin D deficiency, unspecified: Secondary | ICD-10-CM | POA: Diagnosis not present

## 2021-07-19 DIAGNOSIS — Z1329 Encounter for screening for other suspected endocrine disorder: Secondary | ICD-10-CM

## 2021-07-19 DIAGNOSIS — Z136 Encounter for screening for cardiovascular disorders: Secondary | ICD-10-CM | POA: Diagnosis not present

## 2021-07-19 DIAGNOSIS — N401 Enlarged prostate with lower urinary tract symptoms: Secondary | ICD-10-CM | POA: Diagnosis not present

## 2021-07-19 DIAGNOSIS — I700 Atherosclerosis of aorta: Secondary | ICD-10-CM | POA: Diagnosis not present

## 2021-07-19 DIAGNOSIS — Z111 Encounter for screening for respiratory tuberculosis: Secondary | ICD-10-CM

## 2021-07-19 DIAGNOSIS — E1169 Type 2 diabetes mellitus with other specified complication: Secondary | ICD-10-CM

## 2021-07-19 DIAGNOSIS — R350 Frequency of micturition: Secondary | ICD-10-CM | POA: Diagnosis not present

## 2021-07-19 DIAGNOSIS — Z89511 Acquired absence of right leg below knee: Secondary | ICD-10-CM

## 2021-07-19 DIAGNOSIS — Z130 Encounter for screening for diseases of the blood and blood-forming organs and certain disorders involving the immune mechanism: Secondary | ICD-10-CM

## 2021-07-19 DIAGNOSIS — E1140 Type 2 diabetes mellitus with diabetic neuropathy, unspecified: Secondary | ICD-10-CM

## 2021-07-19 DIAGNOSIS — I10 Essential (primary) hypertension: Secondary | ICD-10-CM | POA: Diagnosis not present

## 2021-07-19 DIAGNOSIS — Z8249 Family history of ischemic heart disease and other diseases of the circulatory system: Secondary | ICD-10-CM

## 2021-07-19 DIAGNOSIS — Z79899 Other long term (current) drug therapy: Secondary | ICD-10-CM | POA: Diagnosis not present

## 2021-07-19 DIAGNOSIS — Z0000 Encounter for general adult medical examination without abnormal findings: Secondary | ICD-10-CM | POA: Diagnosis not present

## 2021-07-19 DIAGNOSIS — E349 Endocrine disorder, unspecified: Secondary | ICD-10-CM

## 2021-07-19 DIAGNOSIS — E1122 Type 2 diabetes mellitus with diabetic chronic kidney disease: Secondary | ICD-10-CM

## 2021-07-19 DIAGNOSIS — Z13220 Encounter for screening for lipoid disorders: Secondary | ICD-10-CM | POA: Diagnosis not present

## 2021-07-19 DIAGNOSIS — Z131 Encounter for screening for diabetes mellitus: Secondary | ICD-10-CM

## 2021-07-19 DIAGNOSIS — Z1211 Encounter for screening for malignant neoplasm of colon: Secondary | ICD-10-CM

## 2021-07-19 DIAGNOSIS — Z125 Encounter for screening for malignant neoplasm of prostate: Secondary | ICD-10-CM

## 2021-07-19 DIAGNOSIS — R5383 Other fatigue: Secondary | ICD-10-CM

## 2021-07-19 MED ORDER — TRULICITY 1.5 MG/0.5ML ~~LOC~~ SOAJ: SUBCUTANEOUS | 0 refills | Status: DC

## 2021-07-20 ENCOUNTER — Ambulatory Visit: Payer: BC Managed Care – PPO | Admitting: Speech Pathology

## 2021-07-20 DIAGNOSIS — R4701 Aphasia: Secondary | ICD-10-CM | POA: Diagnosis not present

## 2021-07-20 DIAGNOSIS — R41841 Cognitive communication deficit: Secondary | ICD-10-CM

## 2021-07-20 LAB — LIPID PANEL
Cholesterol: 110 mg/dL (ref <200)
HDL: 27 mg/dL — ABNORMAL LOW (ref >=40)
LDL Cholesterol (Calc): 55 mg/dL (calc)
Non-HDL Cholesterol (Calc): 83 mg/dL (calc) (ref <130)
Total CHOL/HDL Ratio: 4.1 (calc) (ref <5.0)
Triglycerides: 224 mg/dL — ABNORMAL HIGH (ref <150)

## 2021-07-20 LAB — VITAMIN D 25 HYDROXY (VIT D DEFICIENCY, FRACTURES): Vit D, 25-Hydroxy: 74 ng/mL (ref 30–100)

## 2021-07-20 LAB — COMPLETE METABOLIC PANEL WITH GFR
AG Ratio: 1.4 (calc) (ref 1.0–2.5)
ALT: 34 U/L (ref 9–46)
AST: 27 U/L (ref 10–35)
Albumin: 4 g/dL (ref 3.6–5.1)
Alkaline phosphatase (APISO): 60 U/L (ref 35–144)
BUN: 20 mg/dL (ref 7–25)
CO2: 24 mmol/L (ref 20–32)
Calcium: 9.1 mg/dL (ref 8.6–10.3)
Chloride: 103 mmol/L (ref 98–110)
Creat: 0.96 mg/dL (ref 0.70–1.35)
Globulin: 2.9 g/dL (calc) (ref 1.9–3.7)
Glucose, Bld: 150 mg/dL — ABNORMAL HIGH (ref 65–99)
Potassium: 4.8 mmol/L (ref 3.5–5.3)
Sodium: 138 mmol/L (ref 135–146)
Total Bilirubin: 0.5 mg/dL (ref 0.2–1.2)
Total Protein: 6.9 g/dL (ref 6.1–8.1)
eGFR: 89 mL/min/{1.73_m2} (ref >=60)

## 2021-07-20 LAB — TSH: TSH: 1.3 mIU/L (ref 0.40–4.50)

## 2021-07-20 LAB — IRON, TOTAL/TOTAL IRON BINDING CAP
%SAT: 40 % (calc) (ref 20–48)
Iron: 91 ug/dL (ref 50–180)
TIBC: 225 mcg/dL (calc) — ABNORMAL LOW (ref 250–425)

## 2021-07-20 LAB — VITAMIN B12: Vitamin B-12: 366 pg/mL (ref 200–1100)

## 2021-07-20 LAB — CBC WITH DIFFERENTIAL/PLATELET
Absolute Monocytes: 714 cells/uL (ref 200–950)
Basophils Absolute: 60 cells/uL (ref 0–200)
Basophils Relative: 0.7 %
Eosinophils Absolute: 153 cells/uL (ref 15–500)
Eosinophils Relative: 1.8 %
HCT: 40.5 % (ref 38.5–50.0)
Hemoglobin: 13.8 g/dL (ref 13.2–17.1)
Lymphs Abs: 3222 cells/uL (ref 850–3900)
MCH: 32.8 pg (ref 27.0–33.0)
MCHC: 34.1 g/dL (ref 32.0–36.0)
MCV: 96.2 fL (ref 80.0–100.0)
MPV: 10.9 fL (ref 7.5–12.5)
Monocytes Relative: 8.4 %
Neutro Abs: 4352 cells/uL (ref 1500–7800)
Neutrophils Relative %: 51.2 %
Platelets: 205 10*3/uL (ref 140–400)
RBC: 4.21 10*6/uL (ref 4.20–5.80)
RDW: 12.9 % (ref 11.0–15.0)
Total Lymphocyte: 37.9 %
WBC: 8.5 10*3/uL (ref 3.8–10.8)

## 2021-07-20 LAB — HEMOGLOBIN A1C
Hgb A1c MFr Bld: 6.4 % of total Hgb — ABNORMAL HIGH (ref <5.7)
Mean Plasma Glucose: 137 mg/dL
eAG (mmol/L): 7.6 mmol/L

## 2021-07-20 LAB — PSA: PSA: 2.52 ng/mL (ref <=4.00)

## 2021-07-20 LAB — INSULIN, RANDOM: Insulin: 114 u[IU]/mL — ABNORMAL HIGH

## 2021-07-20 LAB — TESTOSTERONE: Testosterone: 194 ng/dL — ABNORMAL LOW (ref 250–827)

## 2021-07-20 LAB — MAGNESIUM: Magnesium: 2 mg/dL (ref 1.5–2.5)

## 2021-07-20 NOTE — Therapy (Signed)
OUTPATIENT SPEECH LANGUAGE PATHOLOGY TREATMENT NOTE    Patient Name: [name] MRN: [id number] DOB:05/12/1960, 61 y.o., male Today's Date: 07/20/2021  PCP: [name], MD REFERRING PROVIDER: [name], MD  END OF SESSION:   End of Session - 07/20/21 1436     Visit Number 16    Number of Visits 24    Date for SLP Re-Evaluation 08/17/21    Authorization Type BCBS    SLP Start Time 1436    SLP Stop Time  2595    SLP Time Calculation (min) 45 min    Activity Tolerance Patient tolerated treatment well                   Past Medical History:  Diagnosis Date   Diabetic neuropathy ([place])    Diverticulitis    History of hepatitis C    has been treated in the past   History of kidney stones    Hyperlipidemia    Hypertension    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[place])    Other testicular hypofunction    Severe sepsis ([place]) 08/26/2019   Type II or unspecified type diabetes mellitus without mention of complication, not stated as uncontrolled    Vitamin D deficiency    Past Surgical History:  Procedure Laterality Date   AMPUTATION Right 09/01/2019   Procedure: RIGHT BELOW KNEE AMPUTATION;  Surgeon: Newt Minion, MD;  Location: [facility];  Service: Orthopedics;  Laterality: Right;   CHOLECYSTECTOMY  1987   COLONOSCOPY     I & D EXTREMITY Right 08/20/2019   Procedure: IRRIGATION & DEBRIDEMENT RIGHT FOOT PARTIAL CALCANEAL EXCISION;  Surgeon: Newt Minion, MD;  Location: [facility];  Service: Orthopedics;  Laterality: Right;   I & D EXTREMITY Right 08/27/2019   Procedure: IRRIGATION AND DEBRIDEMENT  OF FOOT;  Surgeon: Leandrew Koyanagi, MD;  Location: [facility];  Service: Orthopedics;  Laterality: Right;   ORIF TIBIA FRACTURE     x 3 right leg   Patient Active Problem List   Diagnosis Date Noted   Cerebral thrombosis with cerebral infarction 05/09/2021   Aphasia 05/08/2021   Leukocytosis 05/08/2021   Right below-knee amputee ([place]) 09/03/2019   Hyponatremia 08/26/2019    Cutaneous abscess of right foot    Poorly controlled diabetes mellitus ([place]) 07/16/2019   FHx: heart disease 10/08/2017   Type 2 diabetes mellitus with stage 2 chronic kidney disease, without long-term current use of insulin ([place]) 10/08/2017   Insomnia 07/08/2017   Smoker 08/17/2015   COPD (chronic obstructive pulmonary disease) ([place]) 08/17/2015   T2_NIDDM w/Peripheral Neuropathy 04/01/2014   CKD stage 2 due to type 2 diabetes mellitus ([place]) 08/31/2013   Medication management 05/19/2013   Morbid obesity (BMI 35) 05/19/2013   Essential hypertension    Hyperlipidemia associated with type 2 diabetes mellitus ([place])    Vitamin D deficiency    History of kidney stones    Testosterone deficiency    History of hepatitis C     ONSET DATE: 05/08/2021  REFERRING DIAG: I63.9 (ICD-10-CM) - Acute CVA (cerebrovascular accident) ([place])   PERTINENT HISTORY: [name] is a 61 y.o. male admitted to hospital 4/18-19/2023 d/t sudden onset of word finding difficulty.  CT head was done which revealed old left MCA territory infarcts.  MRI brain was completed which showed a moderate area of left frontal lobe acute infarction in addition to the chronic infarcts in the left frontoparietal regions. PMHx singnificant for diabetes, hepatitis C, hypertension, hyperlipidemia.  THERAPY DIAG: Cognitive communication deficit  SUBJECTIVE: "feeling tired today"  PAIN:  Are you having pain? No  OBJECTIVE:   TODAY'S TREATMENT:  07-20-21: Led pt through complex therapy activity targeting auditory comprehension, writing, calculations, attention, problem solving, and verbal expression, imitative of duties pt would be required to perform at work. Pt able to verify auditory presented information via written modality in 6/6 trials with only 1 repetition required. Transcribed written information to invoice sheet on computer accurately in 7/9 trials. IND using strategies of crossing off, repeating back, double checking work  and using calculator to complete calculations. SLP provides mod-A for checking where cursor is, read as inputting data to aid in attention. Pt expresses appreciation for targeting functional tasks in therapy.   07-18-21: Pt able to ID errors on HEP given occasional min-A. Able to correct 2/2 errors with usual mod-A. Demonstrates difficulty in problem solving solution. Discussion on strategies and compensations to aid in successful completion of functional math. Pt ID take his time, slow down, say out loud. SLP adds limiting distractions, focus on . vs = buttons, double checking as he goes, limiting visual field as necessary. SLP provides eduction on time pressure management strategies regarding successful completion of invoicing at work. Pt agrees that completing calculations following phone conversation, limiting external distractions, and budgeting appropriate time to complete will be beneficial to successful completion of invoicing at work. Additional recommendation regarding communicating accommodations to boss. Target attention and working memory in computer data entry task with pt demonstrating initial hesitation then performing 3/3 accurately given extended time. Will readdress next session, mimicking work flow from work.   07-11-21: Target attention to detail, working memory, divided attention, and comprehension in invoice task simulating work requirements. Pt was provided with direct instruction on strategies which can aid in successful completion of task including repeating stimuli during calculation, practicing saying, looking, then pressing in calculator use, repeating back cost to distributor (SLP), double checking work, and verbally working thorough what math manipulation was required of current step. Verbalizes understanding of strategies, requires usual mod-A to utilize during simulation. Demonstrating IND use of double checking. Numerous calculation errors d/t decreased working memory or  attention; approx 60% accurate completion of calculations. Some demonstrated difficulty understanding what calculation is required for percentage to mark up cost to consumer.    PATIENT EDUCATION: Education details: see above Person educated: Patient and Spouse Education method: Explanation, Demonstration, and Handouts Education comprehension: verbalized understanding, returned demonstration, and needs further education   GOALS: Goals reviewed with patient? Yes   SHORT TERM GOALS: Target date: 08/03/2021 (+4 weeks for new goals added at re-cert)    Pt will complete communication PROM during first session.  Baseline: CPIB=16 Goal status: MET    2.  Pt will successfully complete language tasks (e.g. SFA, VNeST) with 80% accuracy with rare min-A  Baseline: 06-06-2021 Goal status: MET   3.  Pt will use trained anomia strategies when answering mod-complex questions in 80% of trials with occasional min-A over 2 sessions Baseline: 06-06-2021 Goal status: MET   4.  Pt will name 7+ items in personally relevant categories with rare min A over 2 sessions Baseline: 05-16-21, 06-06-2021 Goal status: MET  5.  Pt will demonstrate adequate working memory and alternating attention for completion of mod-complex, functional math problems (adding totals on invoice, balancing checkbook) in 80% of opportunities with occasional mod-A over 2 sessions.  Baseline: 07-09-21 Goal status: ongoing (added at recert)  6.  Using targeted strategies and compensations, pt  will read multi-sentence stimuli, and summarize accurately in 8/10 trials with occasional min-A over 2 sessions.  Baseline:  Goal status: ongoing (added at recert)     LONG TERM GOALS: Target date: 08/17/2021 (+6 weeks for re-certification)    Pt will report completion of aphasia HEP with mod-I over 1 week period Baseline: 07-03-21, 07-05-21 Goal status: met   2.  Pt will demonstrate usage of trained anomia strategies in 15+ minute conversation  to improve functional communication over 2 sessions Baseline: 07-03-21, 07-05-21 Goal status: met   3.  Pt will report improved confidence in communication abilities via PROM by d/c Baseline:  Goal status: ongoing  4.  Pt will utilize trained compensations and strategies to read mod-complex reading passage, not greater than 1 page, and summarize reading to SLP with rare min-A over 2 sessions.  Baseline:  Goal status: ongoing (added at recert)  5.  Pt will write work-related words, using accurate or phonetic spelling such that SLP can accurately decode, in 18/20 trials with rate min-A over 2 sessions.  Baseline:  Goal status: ongoing (added at recert)  6. Pt will report implementation of trained compensations and strategies at home, resulting in subjective 50% improvement in abilities to complete personally relevant reading, writing, and math activities over 1 week period.  Baseline:  Goal status: ongoing (added at recert)     ASSESSMENT:   CLINICAL IMPRESSION: Patient is a 61 y.o. M who was initial seen and evaluated for expressive aphasia. Pt continues to present with mild aphasia notable for difficulty with mod-complex verbal expression, occasional anomia, mazing. Less frequent vague speech and word finding difficulties observed in structured tasks and in conversational speech. Conducted ongoing education and training of anomia strategies to aid word finding on structured tasks and in conversation. Targeted functional math calculations with occasional min A provided to aid attention and accuracy. I recommend skilled ST to address cognitive communication skills to maximize return to baseline.    OBJECTIVE IMPAIRMENTS include aphasia. These impairments are limiting patient from effectively communicating at home and in community. No overt factors affecting potential to achieve goals and functional outcomes. Patient will benefit from skilled SLP services to address above impairments and  improve overall function.   REHAB POTENTIAL: Excellent   PLAN: SLP FREQUENCY: 2x/week   SLP DURATION: 14 weeks (including +6 weeks for recertification)     PLANNED INTERVENTIONS: Language facilitation, Cueing hierachy, Cognitive reorganization, Internal/external aids, Functional tasks, SLP instruction and feedback, Compensatory strategies, and Patient/family education   Su Monks, CCC-SLP 07/20/2021, 3:31 PM

## 2021-07-21 LAB — URINALYSIS, ROUTINE W REFLEX MICROSCOPIC
Bilirubin Urine: NEGATIVE
Glucose, UA: NEGATIVE
Hgb urine dipstick: NEGATIVE
Ketones, ur: NEGATIVE
Leukocytes,Ua: NEGATIVE
Nitrite: NEGATIVE
Protein, ur: NEGATIVE
Specific Gravity, Urine: 1.012 (ref 1.001–1.035)
pH: <=5 (ref 5.0–8.0)

## 2021-07-21 LAB — MICROALBUMIN / CREATININE URINE RATIO
Creatinine, Urine: 73 mg/dL (ref 20–320)
Microalb Creat Ratio: 12 mcg/mg creat (ref <30)
Microalb, Ur: 0.9 mg/dL

## 2021-07-22 NOTE — Progress Notes (Signed)
<><><><><><><><><><><><><><><><><><><><><><><><><><><><><><><><><> <><><><><><><><><><><><><><><><><><><><><><><><><><><><><><><><><>  -  Iron levels - Normal & OK  <><><><><><><><><><><><><><><><><><><><><><><><><><><><><><><><><>  -  Testosterone levels low - Recommend take Zinc 50 mg tab to help raise Testosterone llevels naturally.  - Daily exercise 20-30 minutes 2 x /day helps raise Testosterone levels   - Also, Better diet & losing weight helps raise Testosterone levels,  since excess Fat tissue converts your testosterone into . . . . Estrogen  !  <><><><><><><><><><><><><><><><><><><><><><><><><><><><><><><><><> <><><><><><><><><><><><><><><><><><><><><><><><><><><><><><><><><>  -  Random Glucose = 150 mg% - too high  (Ideal is less than 120 mg%)  AND   - A1c = 6.4%  ( Better  - was 7.5% ) - Still too high  - Goal is less than 5.7%   !    Being diabetic has a  300% increased risk for heart attack,                                             stroke, cancer, and alzheimer- type vascular dementia.   It is very important that you work harder with diet by avoiding all foods that  are white except chicken,  fish & calliflower.  - Avoid white rice  (brown & wild rice is OK),   - Avoid white potatoes  (sweet potatoes in moderation is OK),   White bread or wheat bread or anything made out of   white flour like bagels, donuts, rolls, buns, biscuits, cakes,  - pastries, cookies, pizza crust, and pasta (made from  white flour & egg whites)   - vegetarian pasta or spinach or wheat pasta is OK.  - Multigrain breads like Arnold's, Pepperidge Farm or   multigrain sandwich thins or high fiber breads like   Eureka bread or "Dave's Killer" breads that are  4 to 5 grams fiber per slice !  are best.    Diet, exercise and weight loss can reverse and cure  diabetes in the early stages.    - Diet, exercise and weight loss is very important in the   control and prevention of  complications of diabetes which  affects every system in your body, ie.   -Brain - dementia/stroke,  - eyes - glaucoma/blindness,  - heart - heart attack/heart failure,  - kidneys - dialysis,  - stomach - gastric paralysis,  - intestines - malabsorption,  - nerves - severe painful neuritis,  - circulation - gangrene & loss of a leg(s)  - and finally  . . . . . . . . . . . . . . . . . .    - cancer and Alzheimers. <><><><><><><><><><><><><><><><><><><><><><><><><><><><><><><><><> <><><><><><><><><><><><><><><><><><><><><><><><><><><><><><><><><>  -  Total Chol = 110  &  LDL Chol = 55  - Both  Excellent   - Very low risk for Heart Attack  / Stroke <><><><><><><><><><><><><><><><><><><><><><><><><><><><><><><><><>  -  But  Triglycerides  ( = 224  ) or fats in blood are too high                   (  Ideal or Goal is less than 150  !  )    - Recommend avoid fried & greasy foods,  sweets / candy,   - Avoid white rice  (brown or wild rice or Quinoa is OK),   - Avoid white potatoes  (sweet potatoes are OK)   - Avoid anything made from white flour  - bagels,  doughnuts, rolls, buns, biscuits, white and   wheat breads, pizza crust and traditional  pasta made of white flour & egg white  - (vegetarian pasta or spinach or wheat pasta is OK).    - Multi-grain bread is OK - like multi-grain flat bread or  sandwich thins.   - Avoid alcohol in excess.   - Exercise is also important. <><><><><><><><><><><><><><><><><><><><><><><><><><><><><><><><><> <><><><><><><><><><><><><><><><><><><><><><><><><><><><><><><><><>  -  Vit B12 = 366            ( Ideal or Goal Vit B12 is between 450 - 1,100)   Low Vit B12 may be associated with Anemia , Fatigue, Impotence   Peripheral Neuropathy, Dementia, "Brain Fog", & Depression  - Recommend take a sub-lingual form of Vitamin B12 tablet   1,000 to 5,000 mcg tab that you dissolve under your tongue /Daily   - Can get Lavonia Dana - best  price at [employer] or on [employer] <><><><><><><><><><><><><><><><><><><><><><><><><><><><><><><><><> <><><><><><><><><><><><><><><><><><><><><><><><><><><><><><><><><>  -  PSA - Low - Great ! <><><><><><><><><><><><><><><><><><><><><><><><><><><><><><><><><>  -  Vitamin D = 74  - Excellent - Please keep dose same  <><><><><><><><><><><><><><><><><><><><><><><><><><><><><><><><><> <><><><><><><><><><><><><><><><><><><><><><><><><><><><><><><><><>  -  All Else - CBC - Kidneys - Electrolytes - Liver - Magnesium & Thyroid    - all  Normal / OK  <><><><><><><><><><><><><><><><><><><><><><><><><><><><><><><><><> <><><><><><><><><><><><><><><><><><><><><><><><><><><><><><><><><>   -

## 2021-07-23 ENCOUNTER — Ambulatory Visit: Payer: BC Managed Care – PPO | Attending: Internal Medicine

## 2021-07-23 ENCOUNTER — Telehealth: Payer: Self-pay | Admitting: Family Medicine

## 2021-07-23 DIAGNOSIS — R41841 Cognitive communication deficit: Secondary | ICD-10-CM | POA: Diagnosis not present

## 2021-07-23 DIAGNOSIS — R4701 Aphasia: Secondary | ICD-10-CM | POA: Insufficient documentation

## 2021-07-23 NOTE — Therapy (Signed)
OUTPATIENT SPEECH LANGUAGE PATHOLOGY TREATMENT NOTE    Patient Name: [name] MRN: [id number] DOB:1960-11-30, 61 y.o., male Today's Date: 07/23/2021  PCP: [name], MD REFERRING PROVIDER: [name], MD  END OF SESSION:   End of Session - 07/23/21 1447     Visit Number 17    Number of Visits 24    Date for SLP Re-Evaluation 08/17/21    Authorization Type BCBS    SLP Start Time 1446    SLP Stop Time  7672    SLP Time Calculation (min) 44 min    Activity Tolerance Patient tolerated treatment well                    Past Medical History:  Diagnosis Date   Diabetic neuropathy ([place])    Diverticulitis    History of hepatitis C    has been treated in the past   History of kidney stones    Hyperlipidemia    Hypertension    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[place])    Other testicular hypofunction    Severe sepsis ([place]) 08/26/2019   Type II or unspecified type diabetes mellitus without mention of complication, not stated as uncontrolled    Vitamin D deficiency    Past Surgical History:  Procedure Laterality Date   AMPUTATION Right 09/01/2019   Procedure: RIGHT BELOW KNEE AMPUTATION;  Surgeon: Newt Minion, MD;  Location: [place];  Service: Orthopedics;  Laterality: Right;   CHOLECYSTECTOMY  1987   COLONOSCOPY     I & D EXTREMITY Right 08/20/2019   Procedure: IRRIGATION & DEBRIDEMENT RIGHT FOOT PARTIAL CALCANEAL EXCISION;  Surgeon: Newt Minion, MD;  Location: [place];  Service: Orthopedics;  Laterality: Right;   I & D EXTREMITY Right 08/27/2019   Procedure: IRRIGATION AND DEBRIDEMENT  OF FOOT;  Surgeon: Leandrew Koyanagi, MD;  Location: [place];  Service: Orthopedics;  Laterality: Right;   ORIF TIBIA FRACTURE     x 3 right leg   Patient Active Problem List   Diagnosis Date Noted   Cerebral thrombosis with cerebral infarction 05/09/2021   Aphasia 05/08/2021   Leukocytosis 05/08/2021   Right below-knee amputee ([place]) 09/03/2019   Hyponatremia 08/26/2019    Cutaneous abscess of right foot    Poorly controlled diabetes mellitus ([place]) 07/16/2019   FHx: heart disease 10/08/2017   Type 2 diabetes mellitus with stage 2 chronic kidney disease, without long-term current use of insulin ([place]) 10/08/2017   Insomnia 07/08/2017   Smoker 08/17/2015   COPD (chronic obstructive pulmonary disease) ([place]) 08/17/2015   T2_NIDDM w/Peripheral Neuropathy 04/01/2014   CKD stage 2 due to type 2 diabetes mellitus ([place]) 08/31/2013   Medication management 05/19/2013   Morbid obesity (BMI 35) 05/19/2013   Essential hypertension    Hyperlipidemia associated with type 2 diabetes mellitus ([place])    Vitamin D deficiency    History of kidney stones    Testosterone deficiency    History of hepatitis C     ONSET DATE: 05/08/2021  REFERRING DIAG: I63.9 (ICD-10-CM) - Acute CVA (cerebrovascular accident) ([place])   PERTINENT HISTORY: [name] is a 61 y.o. male admitted to hospital 4/18-19/2023 d/t sudden onset of word finding difficulty.  CT head was done which revealed old left MCA territory infarcts.  MRI brain was completed which showed a moderate area of left frontal lobe acute infarction in addition to the chronic infarcts in the left frontoparietal regions. PMHx singnificant for diabetes, hepatitis C, hypertension, hyperlipidemia.  THERAPY DIAG: Cognitive communication deficit  SUBJECTIVE: "I'm having a good day actually"  PAIN:  Are you having pain? No  OBJECTIVE:   TODAY'S TREATMENT:  07-23-21: Targeted online bill pay activity focused on reading comprehension, attention to detail, problem solving, and calculations. Occasional mod A required to attend to and comprehend specific details (such as bill date, amount owed/paid) as pt was not attending to all details of page independently. For inserting numbers into computer, pt able to recognize and correct errors with rare min A. Occasional reduced awareness exhibited (pt stating January as 12th month) with mod A  required to ID verbal error. Updated HEP for check book organization (pt preferred method for bill pay) and manipulation of numbers for temporal orientation as this is area of frustration for patient.   07-20-21: Led pt through complex therapy activity targeting auditory comprehension, writing, calculations, attention, problem solving, and verbal expression, imitative of duties pt would be required to perform at work. Pt able to verify auditory presented information via written modality in 6/6 trials with only 1 repetition required. Transcribed written information to invoice sheet on computer accurately in 7/9 trials. IND using strategies of crossing off, repeating back, double checking work and using calculator to complete calculations. SLP provides mod-A for checking where cursor is, read as inputting data to aid in attention. Pt expresses appreciation for targeting functional tasks in therapy.   07-18-21: Pt able to ID errors on HEP given occasional min-A. Able to correct 2/2 errors with usual mod-A. Demonstrates difficulty in problem solving solution. Discussion on strategies and compensations to aid in successful completion of functional math. Pt ID take his time, slow down, say out loud. SLP adds limiting distractions, focus on . vs = buttons, double checking as he goes, limiting visual field as necessary. SLP provides eduction on time pressure management strategies regarding successful completion of invoicing at work. Pt agrees that completing calculations following phone conversation, limiting external distractions, and budgeting appropriate time to complete will be beneficial to successful completion of invoicing at work. Additional recommendation regarding communicating accommodations to boss. Target attention and working memory in computer data entry task with pt demonstrating initial hesitation then performing 3/3 accurately given extended time. Will readdress next session, mimicking work flow from  work.   07-11-21: Target attention to detail, working memory, divided attention, and comprehension in invoice task simulating work requirements. Pt was provided with direct instruction on strategies which can aid in successful completion of task including repeating stimuli during calculation, practicing saying, looking, then pressing in calculator use, repeating back cost to distributor (SLP), double checking work, and verbally working thorough what math manipulation was required of current step. Verbalizes understanding of strategies, requires usual mod-A to utilize during simulation. Demonstrating IND use of double checking. Numerous calculation errors d/t decreased working memory or attention; approx 60% accurate completion of calculations. Some demonstrated difficulty understanding what calculation is required for percentage to mark up cost to consumer.    PATIENT EDUCATION: Education details: see above Person educated: Patient and Spouse Education method: Explanation, Demonstration, and Handouts Education comprehension: verbalized understanding, returned demonstration, and needs further education   GOALS: Goals reviewed with patient? Yes   SHORT TERM GOALS: Target date: 08/03/2021 (+4 weeks for new goals added at re-cert)    Pt will complete communication PROM during first session.  Baseline: CPIB=16 Goal status: MET    2.  Pt will successfully complete language tasks (e.g. SFA, VNeST) with 80% accuracy with rare min-A  Baseline: 06-06-2021  Goal status: MET   3.  Pt will use trained anomia strategies when answering mod-complex questions in 80% of trials with occasional min-A over 2 sessions Baseline: 06-06-2021 Goal status: MET   4.  Pt will name 7+ items in personally relevant categories with rare min A over 2 sessions Baseline: 05-16-21, 06-06-2021 Goal status: MET  5.  Pt will demonstrate adequate working memory and alternating attention for completion of mod-complex, functional  math problems (adding totals on invoice, balancing checkbook) in 80% of opportunities with occasional mod-A over 2 sessions.  Baseline: 07-09-21 Goal status: ongoing (added at recert)  6.  Using targeted strategies and compensations, pt will read multi-sentence stimuli, and summarize accurately in 8/10 trials with occasional min-A over 2 sessions.  Baseline:  Goal status: ongoing (added at recert)     LONG TERM GOALS: Target date: 08/17/2021 (+6 weeks for re-certification)    Pt will report completion of aphasia HEP with mod-I over 1 week period Baseline: 07-03-21, 07-05-21 Goal status: met   2.  Pt will demonstrate usage of trained anomia strategies in 15+ minute conversation to improve functional communication over 2 sessions Baseline: 07-03-21, 07-05-21 Goal status: met   3.  Pt will report improved confidence in communication abilities via PROM by d/c Baseline:  Goal status: ongoing  4.  Pt will utilize trained compensations and strategies to read mod-complex reading passage, not greater than 1 page, and summarize reading to SLP with rare min-A over 2 sessions.  Baseline:  Goal status: ongoing (added at recert)  5.  Pt will write work-related words, using accurate or phonetic spelling such that SLP can accurately decode, in 18/20 trials with rate min-A over 2 sessions.  Baseline:  Goal status: ongoing (added at recert)  6. Pt will report implementation of trained compensations and strategies at home, resulting in subjective 50% improvement in abilities to complete personally relevant reading, writing, and math activities over 1 week period.  Baseline:  Goal status: ongoing (added at recert)     ASSESSMENT:   CLINICAL IMPRESSION: Patient is a 61 y.o. M who was initially seen and evaluated for expressive aphasia. Pt continues to present with mild aphasia notable for difficulty with mod-complex verbal expression, occasional anomia, mazing. Less frequent vague speech and word  finding difficulties observed in structured tasks and in conversational speech. Conducted ongoing education and training of strategies to aid completion of functional cognitive tasks with occasional min to mod A provided to aid attention and accuracy. I recommend skilled ST to address cognitive communication skills to maximize return to baseline.    OBJECTIVE IMPAIRMENTS include aphasia. These impairments are limiting patient from effectively communicating at home and in community. No overt factors affecting potential to achieve goals and functional outcomes. Patient will benefit from skilled SLP services to address above impairments and improve overall function.   REHAB POTENTIAL: Excellent   PLAN: SLP FREQUENCY: 2x/week   SLP DURATION: 14 weeks (including +6 weeks for recertification)     PLANNED INTERVENTIONS: Language facilitation, Cueing hierachy, Cognitive reorganization, Internal/external aids, Functional tasks, SLP instruction and feedback, Compensatory strategies, and Patient/family education   Marzetta Board, CCC-SLP 07/23/2021, 3:35 PM

## 2021-07-23 NOTE — Telephone Encounter (Signed)
Pt wife came in stating she would like short term disability papers filled out as soon as possible, pt is hoping to start back at work in August.

## 2021-07-23 NOTE — Telephone Encounter (Signed)
Pt's wife some short term disability papers were suppose to be faxed to [employer] from [employer] for being out of work for a stroke. Needed to be filled out and faxed back.

## 2021-07-23 NOTE — Telephone Encounter (Signed)
Called and LVM for wife. Needing to know what exactly is needed.

## 2021-07-25 ENCOUNTER — Ambulatory Visit: Payer: BC Managed Care – PPO

## 2021-07-25 NOTE — Telephone Encounter (Signed)
R/c principal form, Form in pod.

## 2021-07-26 ENCOUNTER — Ambulatory Visit: Payer: BC Managed Care – PPO | Admitting: Speech Pathology

## 2021-07-26 DIAGNOSIS — R4701 Aphasia: Secondary | ICD-10-CM | POA: Diagnosis not present

## 2021-07-26 DIAGNOSIS — R41841 Cognitive communication deficit: Secondary | ICD-10-CM

## 2021-07-26 NOTE — Therapy (Signed)
OUTPATIENT SPEECH LANGUAGE PATHOLOGY TREATMENT NOTE    Patient Name: [name] MRN: [id number] DOB:1960/09/13, 61 y.o., male Today's Date: 07/26/2021  PCP: [name], MD REFERRING PROVIDER: [name], MD  END OF SESSION:   End of Session - 07/26/21 1601     Visit Number 18    Number of Visits 24    Date for SLP Re-Evaluation 08/17/21    Authorization Type BCBS    SLP Start Time 1445    SLP Stop Time  8250    SLP Time Calculation (min) 49 min    Activity Tolerance Patient tolerated treatment well                    Past Medical History:  Diagnosis Date   Diabetic neuropathy ([place])    Diverticulitis    History of hepatitis C    has been treated in the past   History of kidney stones    Hyperlipidemia    Hypertension    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[place])    Other testicular hypofunction    Severe sepsis ([place]) 08/26/2019   Type II or unspecified type diabetes mellitus without mention of complication, not stated as uncontrolled    Vitamin D deficiency    Past Surgical History:  Procedure Laterality Date   AMPUTATION Right 09/01/2019   Procedure: RIGHT BELOW KNEE AMPUTATION;  Surgeon: Newt Minion, MD;  Location: [place];  Service: Orthopedics;  Laterality: Right;   CHOLECYSTECTOMY  1987   COLONOSCOPY     I & D EXTREMITY Right 08/20/2019   Procedure: IRRIGATION & DEBRIDEMENT RIGHT FOOT PARTIAL CALCANEAL EXCISION;  Surgeon: Newt Minion, MD;  Location: [place];  Service: Orthopedics;  Laterality: Right;   I & D EXTREMITY Right 08/27/2019   Procedure: IRRIGATION AND DEBRIDEMENT  OF FOOT;  Surgeon: Leandrew Koyanagi, MD;  Location: [place];  Service: Orthopedics;  Laterality: Right;   ORIF TIBIA FRACTURE     x 3 right leg   Patient Active Problem List   Diagnosis Date Noted   Cerebral thrombosis with cerebral infarction 05/09/2021   Aphasia 05/08/2021   Leukocytosis 05/08/2021   Right below-knee amputee ([place]) 09/03/2019   Hyponatremia 08/26/2019    Cutaneous abscess of right foot    Poorly controlled diabetes mellitus ([place]) 07/16/2019   FHx: heart disease 10/08/2017   Type 2 diabetes mellitus with stage 2 chronic kidney disease, without long-term current use of insulin ([place]) 10/08/2017   Insomnia 07/08/2017   Smoker 08/17/2015   COPD (chronic obstructive pulmonary disease) ([place]) 08/17/2015   T2_NIDDM w/Peripheral Neuropathy 04/01/2014   CKD stage 2 due to type 2 diabetes mellitus ([place]) 08/31/2013   Medication management 05/19/2013   Morbid obesity (BMI 35) 05/19/2013   Essential hypertension    Hyperlipidemia associated with type 2 diabetes mellitus ([place])    Vitamin D deficiency    History of kidney stones    Testosterone deficiency    History of hepatitis C     ONSET DATE: 05/08/2021  REFERRING DIAG: I63.9 (ICD-10-CM) - Acute CVA (cerebrovascular accident) ([place])   PERTINENT HISTORY: [name] is a 61 y.o. male admitted to hospital 4/18-19/2023 d/t sudden onset of word finding difficulty.  CT head was done which revealed old left MCA territory infarcts.  MRI brain was completed which showed a moderate area of left frontal lobe acute infarction in addition to the chronic infarcts in the left frontoparietal regions. PMHx singnificant for diabetes, hepatitis C, hypertension, hyperlipidemia.  THERAPY DIAG: Cognitive communication deficit  Aphasia  SUBJECTIVE: "It went well, I did it today"   PAIN:  Are you having pain? No  OBJECTIVE:   TODAY'S TREATMENT:  07-26-21: Target attention, working memory, math calculations, reading comprehension, and written expression in [employer] activities. SLP provides initial education on strategies to improve success prior to beginning task- e.g. saying out loud, focus on point vs equals, double checking work. Pt able to balance x5 calculations accurately, self-ID x2 errors and correcting. Initial difficulty with calculator use but throughout task demonstrated increasing ease of  use. Attention and memory compensations appear to be intermittently beneficial for successful manipulation of mod-complex, functional information required for daily tasks requiring mathematical manipulations. Continues to present with deficits that become increasingly apparent when complexity of task is increased.    07-23-21: Targeted online bill pay activity focused on reading comprehension, attention to detail, problem solving, and calculations. Occasional mod A required to attend to and comprehend specific details (such as bill date, amount owed/paid) as pt was not attending to all details of page independently. For inserting numbers into computer, pt able to recognize and correct errors with rare min A. Occasional reduced awareness exhibited (pt stating January as 12th month) with mod A required to ID verbal error. Updated HEP for check book organization (pt preferred method for bill pay) and manipulation of numbers for temporal orientation as this is area of frustration for patient.   07-20-21: Led pt through complex therapy activity targeting auditory comprehension, writing, calculations, attention, problem solving, and verbal expression, imitative of duties pt would be required to perform at work. Pt able to verify auditory presented information via written modality in 6/6 trials with only 1 repetition required. Transcribed written information to invoice sheet on computer accurately in 7/9 trials. IND using strategies of crossing off, repeating back, double checking work and using calculator to complete calculations. SLP provides mod-A for checking where cursor is, read as inputting data to aid in attention. Pt expresses appreciation for targeting functional tasks in therapy.   07-18-21: Pt able to ID errors on HEP given occasional min-A. Able to correct 2/2 errors with usual mod-A. Demonstrates difficulty in problem solving solution. Discussion on strategies and compensations to aid in successful  completion of functional math. Pt ID take his time, slow down, say out loud. SLP adds limiting distractions, focus on . vs = buttons, double checking as he goes, limiting visual field as necessary. SLP provides eduction on time pressure management strategies regarding successful completion of invoicing at work. Pt agrees that completing calculations following phone conversation, limiting external distractions, and budgeting appropriate time to complete will be beneficial to successful completion of invoicing at work. Additional recommendation regarding communicating accommodations to boss. Target attention and working memory in computer data entry task with pt demonstrating initial hesitation then performing 3/3 accurately given extended time. Will readdress next session, mimicking work flow from work.   07-11-21: Target attention to detail, working memory, divided attention, and comprehension in invoice task simulating work requirements. Pt was provided with direct instruction on strategies which can aid in successful completion of task including repeating stimuli during calculation, practicing saying, looking, then pressing in calculator use, repeating back cost to distributor (SLP), double checking work, and verbally working thorough what math manipulation was required of current step. Verbalizes understanding of strategies, requires usual mod-A to utilize during simulation. Demonstrating IND use of double checking. Numerous calculation errors d/t decreased working memory or attention; approx 60% accurate completion of  calculations. Some demonstrated difficulty understanding what calculation is required for percentage to mark up cost to consumer.    PATIENT EDUCATION: Education details: see above Person educated: Patient and Spouse Education method: Explanation, Demonstration, and Handouts Education comprehension: verbalized understanding, returned demonstration, and needs further  education   GOALS: Goals reviewed with patient? Yes   SHORT TERM GOALS: Target date: 08/03/2021 (+4 weeks for new goals added at re-cert)    Pt will complete communication PROM during first session.  Baseline: CPIB=16 Goal status: MET    2.  Pt will successfully complete language tasks (e.g. SFA, VNeST) with 80% accuracy with rare min-A  Baseline: 06-06-2021 Goal status: MET   3.  Pt will use trained anomia strategies when answering mod-complex questions in 80% of trials with occasional min-A over 2 sessions Baseline: 06-06-2021 Goal status: MET   4.  Pt will name 7+ items in personally relevant categories with rare min A over 2 sessions Baseline: 05-16-21, 06-06-2021 Goal status: MET  5.  Pt will demonstrate adequate working memory and alternating attention for completion of mod-complex, functional math problems (adding totals on invoice, balancing checkbook) in 80% of opportunities with occasional mod-A over 2 sessions.  Baseline: 07-09-21 Goal status: ongoing (added at recert)  6.  Using targeted strategies and compensations, pt will read multi-sentence stimuli, and summarize accurately in 8/10 trials with occasional min-A over 2 sessions.  Baseline:  Goal status: ongoing (added at recert)     LONG TERM GOALS: Target date: 08/17/2021 (+6 weeks for re-certification)    Pt will report completion of aphasia HEP with mod-I over 1 week period Baseline: 07-03-21, 07-05-21 Goal status: met   2.  Pt will demonstrate usage of trained anomia strategies in 15+ minute conversation to improve functional communication over 2 sessions Baseline: 07-03-21, 07-05-21 Goal status: met   3.  Pt will report improved confidence in communication abilities via PROM by d/c Baseline:  Goal status: ongoing  4.  Pt will utilize trained compensations and strategies to read mod-complex reading passage, not greater than 1 page, and summarize reading to SLP with rare min-A over 2 sessions.  Baseline:  Goal  status: ongoing (added at recert)  5.  Pt will write work-related words, using accurate or phonetic spelling such that SLP can accurately decode, in 18/20 trials with rate min-A over 2 sessions.  Baseline:  Goal status: ongoing (added at recert)  6. Pt will report implementation of trained compensations and strategies at home, resulting in subjective 50% improvement in abilities to complete personally relevant reading, writing, and math activities over 1 week period.  Baseline:  Goal status: ongoing (added at recert)     ASSESSMENT:   CLINICAL IMPRESSION: Patient is a 61 y.o. M who was initially seen and evaluated for expressive aphasia. Pt continues to present with mild aphasia notable for difficulty with mod-complex verbal expression, occasional anomia, mazing. Less frequent vague speech and word finding difficulties observed in structured tasks and in conversational speech. Conducted ongoing education and training of strategies to aid completion of functional cognitive tasks with occasional min to mod A provided to aid attention and accuracy. I recommend skilled ST to address cognitive communication skills to maximize return to baseline.    OBJECTIVE IMPAIRMENTS include aphasia. These impairments are limiting patient from effectively communicating at home and in community. No overt factors affecting potential to achieve goals and functional outcomes. Patient will benefit from skilled SLP services to address above impairments and improve overall function.   REHAB  POTENTIAL: Excellent   PLAN: SLP FREQUENCY: 2x/week   SLP DURATION: 14 weeks (including +6 weeks for recertification)     PLANNED INTERVENTIONS: Language facilitation, Cueing hierachy, Cognitive reorganization, Internal/external aids, Functional tasks, SLP instruction and feedback, Compensatory strategies, and Patient/family education   Su Monks, CCC-SLP 07/26/2021, 4:02 PM

## 2021-07-26 NOTE — Telephone Encounter (Signed)
Called the wife because the paperwork asking questions about him returning to work but unsure of his job duties.  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 He struggles with mixing dates and numbers.  ST continues to work with him and he has 2 appointments scheduled 19 th and 21 st of the month which will determine if he is better to go back to work.  I will have Amy review the ST recent notes and we will complete the paperwork the best we can. The wife was appreciative.

## 2021-08-01 ENCOUNTER — Encounter: Payer: BC Managed Care – PPO | Admitting: Speech Pathology

## 2021-08-01 NOTE — Telephone Encounter (Signed)
Paperwork has been completed for the patient. Faxed to the number provided on the form. Called the wife to advise this had been completed. There was no answer. LVM advising the form was completed, signed and faxed. Advised to call back if she had any other questions.

## 2021-08-08 ENCOUNTER — Ambulatory Visit: Payer: BC Managed Care – PPO | Admitting: Speech Pathology

## 2021-08-08 DIAGNOSIS — R4701 Aphasia: Secondary | ICD-10-CM | POA: Diagnosis not present

## 2021-08-08 DIAGNOSIS — R41841 Cognitive communication deficit: Secondary | ICD-10-CM | POA: Diagnosis not present

## 2021-08-08 NOTE — Patient Instructions (Signed)
Return to Work:  Consider your optimal time to work   Consider the time it'll take to complete different tasks  Set reasonable expectations for amount of work accomplished   Consider having upfront conversations with management about what it will take to be successful     Strategies:   Stick to one thing at a time Double check your work as you go  Complete sanity checks on your work  Repeat back information to ensure accurate comprehension  Take your time - don't rush  Limit distractions -- turn off phone ringer, close e-mail, turn off radio, shut the door Avoid interactions with people who aggravate you  Give yourself brain break -- following completion of a task or potentially in the middle if you get into something long

## 2021-08-08 NOTE — Therapy (Signed)
OUTPATIENT SPEECH LANGUAGE PATHOLOGY TREATMENT NOTE    Patient Name: [name] MRN: [id number] DOB:1960-10-30, 61 y.o., male Today's Date: 08/08/2021  PCP: Michael Pinto, MD REFERRING PROVIDER: [name], MD  END OF SESSION:   End of Session - 08/08/21 1443     Visit Number 19    Number of Visits 24    Date for SLP Re-Evaluation 08/17/21    Authorization Type BCBS    SLP Start Time 1443    SLP Stop Time  1525    SLP Time Calculation (min) 42 min    Activity Tolerance Patient tolerated treatment well                     Past Medical History:  Diagnosis Date   Diabetic neuropathy ([place])    Diverticulitis    History of hepatitis C    has been treated in the past   History of kidney stones    Hyperlipidemia    Hypertension    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[place])    Other testicular hypofunction    Severe sepsis ([place]) 08/26/2019   Type II or unspecified type diabetes mellitus without mention of complication, not stated as uncontrolled    Vitamin D deficiency    Past Surgical History:  Procedure Laterality Date   AMPUTATION Right 09/01/2019   Procedure: RIGHT BELOW KNEE AMPUTATION;  Surgeon: Newt Minion, MD;  Location: [facility];  Service: Orthopedics;  Laterality: Right;   CHOLECYSTECTOMY  1987   COLONOSCOPY     I & D EXTREMITY Right 08/20/2019   Procedure: IRRIGATION & DEBRIDEMENT RIGHT FOOT PARTIAL CALCANEAL EXCISION;  Surgeon: Newt Minion, MD;  Location: [facility];  Service: Orthopedics;  Laterality: Right;   I & D EXTREMITY Right 08/27/2019   Procedure: IRRIGATION AND DEBRIDEMENT  OF FOOT;  Surgeon: Leandrew Koyanagi, MD;  Location: [facility];  Service: Orthopedics;  Laterality: Right;   ORIF TIBIA FRACTURE     x 3 right leg   Patient Active Problem List   Diagnosis Date Noted   Cerebral thrombosis with cerebral infarction 05/09/2021   Aphasia 05/08/2021   Leukocytosis 05/08/2021   Right below-knee amputee ([place]) 09/03/2019   Hyponatremia 08/26/2019    Cutaneous abscess of right foot    Poorly controlled diabetes mellitus ([place]) 07/16/2019   FHx: heart disease 10/08/2017   Type 2 diabetes mellitus with stage 2 chronic kidney disease, without long-term current use of insulin ([place]) 10/08/2017   Insomnia 07/08/2017   Smoker 08/17/2015   COPD (chronic obstructive pulmonary disease) ([place]) 08/17/2015   T2_NIDDM w/Peripheral Neuropathy 04/01/2014   CKD stage 2 due to type 2 diabetes mellitus ([place]) 08/31/2013   Medication management 05/19/2013   Morbid obesity (BMI 35) 05/19/2013   Essential hypertension    Hyperlipidemia associated with type 2 diabetes mellitus ([place])    Vitamin D deficiency    History of kidney stones    Testosterone deficiency    History of hepatitis C     ONSET DATE: 05/08/2021  REFERRING DIAG: I63.9 (ICD-10-CM) - Acute CVA (cerebrovascular accident) ([place])   PERTINENT HISTORY: [name] is a 61 y.o. male admitted to hospital 4/18-19/2023 d/t sudden onset of word finding difficulty.  CT head was done which revealed old left MCA territory infarcts.  MRI brain was completed which showed a moderate area of left frontal lobe acute infarction in addition to the chronic infarcts in the left frontoparietal regions. PMHx singnificant for diabetes, hepatitis C, hypertension,  hyperlipidemia.   THERAPY DIAG: Cognitive communication deficit  Aphasia  SUBJECTIVE: "it was really good" re: recent vacation  PAIN:  Are you having pain? No  OBJECTIVE:   TODAY'S TREATMENT:  08-08-21: Targeted re-education of strategies and compensations which have proven to be beneficial for successful completion of tasks requiring attention, reading/auditory comprehension, writing skills, and memory. Pt able to verbalize x4 beneficial strategies with min-A, additional education given on strategies SLP has noticed has being effective. Generated list of accommodations to assist in successful return to work which pt endorses as being helpful. Reviewed  HEP in which pt complete functional math computations with 100% accuracy, including x5 instances of self-ID and self-correcting errors. Pt continues to endorse gratitude for therapeutic interventions thus far.   07-26-21: Target attention, working memory, math calculations, reading comprehension, and written expression in checkbook balancing activities. SLP provides initial education on strategies to improve success prior to beginning task- e.g. saying out loud, focus on point vs equals, double checking work. Pt able to balance x5 calculations accurately, self-ID x2 errors and correcting. Initial difficulty with calculator use but throughout task demonstrated increasing ease of use. Attention and memory compensations appear to be intermittently beneficial for successful manipulation of mod-complex, functional information required for daily tasks requiring mathematical manipulations. Continues to present with deficits that become increasingly apparent when complexity of task is increased.    07-23-21: Targeted online bill pay activity focused on reading comprehension, attention to detail, problem solving, and calculations. Occasional mod A required to attend to and comprehend specific details (such as bill date, amount owed/paid) as pt was not attending to all details of page independently. For inserting numbers into computer, pt able to recognize and correct errors with rare min A. Occasional reduced awareness exhibited (pt stating January as 12th month) with mod A required to ID verbal error. Updated HEP for check book organization (pt preferred method for bill pay) and manipulation of numbers for temporal orientation as this is area of frustration for patient.   PATIENT EDUCATION: Education details: see above Person educated: Patient and Spouse Education method: Explanation, Demonstration, and Handouts Education comprehension: verbalized understanding, returned demonstration, and needs further  education   GOALS: Goals reviewed with patient? Yes   SHORT TERM GOALS: Target date: 08/03/2021 (+4 weeks for new goals added at re-cert)    Pt will complete communication PROM during first session.  Baseline: CPIB=16 Goal status: MET    2.  Pt will successfully complete language tasks (e.g. SFA, VNeST) with 80% accuracy with rare min-A  Baseline: 06-06-2021 Goal status: MET   3.  Pt will use trained anomia strategies when answering mod-complex questions in 80% of trials with occasional min-A over 2 sessions Baseline: 06-06-2021 Goal status: MET   4.  Pt will name 7+ items in personally relevant categories with rare min A over 2 sessions Baseline: 05-16-21, 06-06-2021 Goal status: MET  5.  Pt will demonstrate adequate working memory and alternating attention for completion of mod-complex, functional math problems (adding totals on invoice, balancing checkbook) in 80% of opportunities with occasional mod-A over 2 sessions.  Baseline: 07-09-21 Goal status: ongoing (added at recert)  6.  Using targeted strategies and compensations, pt will read multi-sentence stimuli, and summarize accurately in 8/10 trials with occasional min-A over 2 sessions.  Baseline:  Goal status: ongoing (added at recert)     LONG TERM GOALS: Target date: 08/17/2021 (+6 weeks for re-certification)    Pt will report completion of aphasia HEP with mod-I over  1 week period Baseline: 07-03-21, 07-05-21 Goal status: met   2.  Pt will demonstrate usage of trained anomia strategies in 15+ minute conversation to improve functional communication over 2 sessions Baseline: 07-03-21, 07-05-21 Goal status: met   3.  Pt will report improved confidence in communication abilities via PROM by d/c Baseline:  Goal status: ongoing  4.  Pt will utilize trained compensations and strategies to read mod-complex reading passage, not greater than 1 page, and summarize reading to SLP with rare min-A over 2 sessions.  Baseline:  Goal  status: ongoing (added at recert)  5.  Pt will write work-related words, using accurate or phonetic spelling such that SLP can accurately decode, in 18/20 trials with rate min-A over 2 sessions.  Baseline:  Goal status: ongoing (added at recert)  6. Pt will report implementation of trained compensations and strategies at home, resulting in subjective 50% improvement in abilities to complete personally relevant reading, writing, and math activities over 1 week period.  Baseline:  Goal status: ongoing (added at recert)     ASSESSMENT:   CLINICAL IMPRESSION: Patient is a 61 y.o. M who was initially seen and evaluated for expressive aphasia. Pt continues to present with mild aphasia notable for difficulty with mod-complex verbal expression, occasional anomia, mazing. Less frequent vague speech and word finding difficulties observed in structured tasks and in conversational speech. Conducted ongoing education and training of strategies to aid completion of functional cognitive tasks with occasional min to mod A provided to aid attention and accuracy. I recommend skilled ST to address cognitive communication skills to maximize return to baseline.    OBJECTIVE IMPAIRMENTS include aphasia. These impairments are limiting patient from effectively communicating at home and in community. No overt factors affecting potential to achieve goals and functional outcomes. Patient will benefit from skilled SLP services to address above impairments and improve overall function.   REHAB POTENTIAL: Excellent   PLAN: SLP FREQUENCY: 2x/week   SLP DURATION: 14 weeks (including +6 weeks for recertification)     PLANNED INTERVENTIONS: Language facilitation, Cueing hierachy, Cognitive reorganization, Internal/external aids, Functional tasks, SLP instruction and feedback, Compensatory strategies, and Patient/family education   Su Monks, CCC-SLP 08/08/2021, 3:25 PM

## 2021-08-10 ENCOUNTER — Ambulatory Visit: Payer: BC Managed Care – PPO | Admitting: Speech Pathology

## 2021-08-10 ENCOUNTER — Other Ambulatory Visit: Payer: Self-pay | Admitting: Internal Medicine

## 2021-08-10 DIAGNOSIS — R41841 Cognitive communication deficit: Secondary | ICD-10-CM | POA: Diagnosis not present

## 2021-08-10 DIAGNOSIS — R4701 Aphasia: Secondary | ICD-10-CM

## 2021-08-10 DIAGNOSIS — E1140 Type 2 diabetes mellitus with diabetic neuropathy, unspecified: Secondary | ICD-10-CM

## 2021-08-10 DIAGNOSIS — E1122 Type 2 diabetes mellitus with diabetic chronic kidney disease: Secondary | ICD-10-CM

## 2021-08-10 NOTE — Therapy (Signed)
OUTPATIENT SPEECH LANGUAGE PATHOLOGY TREATMENT NOTE    Patient Name: [name] MRN: [id number] DOB:Nov 05, 1960, 61 y.o., male Today's Date: 08/10/2021  PCP: [name], MD REFERRING PROVIDER: [name], MD  END OF SESSION:   End of Session - 08/10/21 1317     Visit Number 20    Number of Visits 24    Date for SLP Re-Evaluation 09/14/21   +4 weeks for re-cert   Authorization Type BCBS    SLP Start Time 1317    SLP Stop Time  1400    SLP Time Calculation (min) 43 min    Activity Tolerance Patient tolerated treatment well                     Past Medical History:  Diagnosis Date   Diabetic neuropathy ([place])    Diverticulitis    History of hepatitis C    has been treated in the past   History of kidney stones    Hyperlipidemia    Hypertension    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[place])    Other testicular hypofunction    Severe sepsis ([place]) 08/26/2019   Type II or unspecified type diabetes mellitus without mention of complication, not stated as uncontrolled    Vitamin D deficiency    Past Surgical History:  Procedure Laterality Date   AMPUTATION Right 09/01/2019   Procedure: RIGHT BELOW KNEE AMPUTATION;  Surgeon: Newt Minion, MD;  Location: [facility];  Service: Orthopedics;  Laterality: Right;   CHOLECYSTECTOMY  1987   COLONOSCOPY     I & D EXTREMITY Right 08/20/2019   Procedure: IRRIGATION & DEBRIDEMENT RIGHT FOOT PARTIAL CALCANEAL EXCISION;  Surgeon: Newt Minion, MD;  Location: [facility];  Service: Orthopedics;  Laterality: Right;   I & D EXTREMITY Right 08/27/2019   Procedure: IRRIGATION AND DEBRIDEMENT  OF FOOT;  Surgeon: Leandrew Koyanagi, MD;  Location: [facility];  Service: Orthopedics;  Laterality: Right;   ORIF TIBIA FRACTURE     x 3 right leg   Patient Active Problem List   Diagnosis Date Noted   Cerebral thrombosis with cerebral infarction 05/09/2021   Aphasia 05/08/2021   Leukocytosis 05/08/2021   Right below-knee amputee ([place]) 09/03/2019    Hyponatremia 08/26/2019   Cutaneous abscess of right foot    Poorly controlled diabetes mellitus ([place]) 07/16/2019   FHx: heart disease 10/08/2017   Type 2 diabetes mellitus with stage 2 chronic kidney disease, without long-term current use of insulin ([place]) 10/08/2017   Insomnia 07/08/2017   Smoker 08/17/2015   COPD (chronic obstructive pulmonary disease) ([place]) 08/17/2015   T2_NIDDM w/Peripheral Neuropathy 04/01/2014   CKD stage 2 due to type 2 diabetes mellitus ([place]) 08/31/2013   Medication management 05/19/2013   Morbid obesity (BMI 35) 05/19/2013   Essential hypertension    Hyperlipidemia associated with type 2 diabetes mellitus ([place])    Vitamin D deficiency    History of kidney stones    Testosterone deficiency    History of hepatitis C     ONSET DATE: 05/08/2021  REFERRING DIAG: I63.9 (ICD-10-CM) - Acute CVA (cerebrovascular accident) ([place])   PERTINENT HISTORY: [name] is a 61 y.o. male admitted to hospital 4/18-19/2023 d/t sudden onset of word finding difficulty.  CT head was done which revealed old left MCA territory infarcts.  MRI brain was completed which showed a moderate area of left frontal lobe acute infarction in addition to the chronic infarcts in the left frontoparietal regions. PMHx singnificant  for diabetes, hepatitis C, hypertension, hyperlipidemia.   THERAPY DIAG: Cognitive communication deficit  Aphasia  SUBJECTIVE: "my main thing is to get back into shape to get back to work"    PAIN:  Are you having pain? No  OBJECTIVE:   TODAY'S TREATMENT:  08-10-21: Reviewed return to work compensations. SLP provided education on ways pt can increase cognitive endurance and alleviate increased cognitive fatigue. Pt requires max-A to generate x2 tasks he can incorporate into daily routine. Discussion on factors which impact cognitive fatigue, pt able to each back following instruction with min-A. SLP provides systematic instruction with demonstration, strategies to  assist in maintaining attention and increaseing accuracy with mod-complex reading tasks.  Pt declines to complete reading task this date.  08-08-21: Targeted re-education of strategies and compensations which have proven to be beneficial for successful completion of tasks requiring attention, reading/auditory comprehension, writing skills, and memory. Pt able to verbalize x4 beneficial strategies with min-A, additional education given on strategies SLP has noticed has being effective. Generated list of accommodations to assist in successful return to work which pt endorses as being helpful. Reviewed HEP in which pt complete functional math computations with 100% accuracy, including x5 instances of self-ID and self-correcting errors. Pt continues to endorse gratitude for therapeutic interventions thus far.   07-26-21: Target attention, working memory, math calculations, reading comprehension, and written expression in checkbook balancing activities. SLP provides initial education on strategies to improve success prior to beginning task- e.g. saying out loud, focus on point vs equals, double checking work. Pt able to balance x5 calculations accurately, self-ID x2 errors and correcting. Initial difficulty with calculator use but throughout task demonstrated increasing ease of use. Attention and memory compensations appear to be intermittently beneficial for successful manipulation of mod-complex, functional information required for daily tasks requiring mathematical manipulations. Continues to present with deficits that become increasingly apparent when complexity of task is increased.    07-23-21: Targeted online bill pay activity focused on reading comprehension, attention to detail, problem solving, and calculations. Occasional mod A required to attend to and comprehend specific details (such as bill date, amount owed/paid) as pt was not attending to all details of page independently. For inserting numbers into  computer, pt able to recognize and correct errors with rare min A. Occasional reduced awareness exhibited (pt stating January as 12th month) with mod A required to ID verbal error. Updated HEP for check book organization (pt preferred method for bill pay) and manipulation of numbers for temporal orientation as this is area of frustration for patient.   PATIENT EDUCATION: Education details: see above Person educated: Patient and Spouse Education method: Explanation, Demonstration, and Handouts Education comprehension: verbalized understanding, returned demonstration, and needs further education   GOALS: Goals reviewed with patient? Yes   SHORT TERM GOALS: Target date: 08/03/2021 (+4 weeks for new goals added at re-cert)    Pt will complete communication PROM during first session.  Baseline: CPIB=16 Goal status: MET    2.  Pt will successfully complete language tasks (e.g. SFA, VNeST) with 80% accuracy with rare min-A  Baseline: 06-06-2021 Goal status: MET   3.  Pt will use trained anomia strategies when answering mod-complex questions in 80% of trials with occasional min-A over 2 sessions Baseline: 06-06-2021 Goal status: MET   4.  Pt will name 7+ items in personally relevant categories with rare min A over 2 sessions Baseline: 05-16-21, 06-06-2021 Goal status: MET  5.  Pt will demonstrate adequate working memory and alternating attention  for completion of mod-complex, functional math problems (adding totals on invoice, balancing checkbook) in 80% of opportunities with occasional mod-A over 2 sessions.  Baseline: 07-09-21 Goal status: ongoing (added at recert)  6.  Using targeted strategies and compensations, pt will read multi-sentence stimuli, and summarize accurately in 8/10 trials with occasional min-A over 2 sessions.  Baseline:  Goal status: ongoing (added at recert)     LONG TERM GOALS: Target date: 08/17/2021 (+6 weeks for re-certification)    Pt will report completion of  aphasia HEP with mod-I over 1 week period Baseline: 07-03-21, 07-05-21 Goal status: met   2.  Pt will demonstrate usage of trained anomia strategies in 15+ minute conversation to improve functional communication over 2 sessions Baseline: 07-03-21, 07-05-21 Goal status: met   3.  Pt will report improved confidence in communication abilities via PROM by d/c Baseline:  Goal status: ongoing  4.  Pt will utilize trained compensations and strategies to read mod-complex reading passage, not greater than 1 page, and summarize reading to SLP with rare min-A over 2 sessions.  Baseline:  Goal status: ongoing (added at recert)  5.  Pt will write work-related words, using accurate or phonetic spelling such that SLP can accurately decode, in 18/20 trials with rate min-A over 2 sessions.  Baseline: 07-05-21, 08-10-21 Goal status: met  6. Pt will report implementation of trained compensations and strategies at home, resulting in subjective 50% improvement in abilities to complete personally relevant reading, writing, and math activities over 1 week period.  Baseline: 08-10-21 Goal status: met  7.  Pt will report carryover of attention and memory compensations at work with mod-I over 1 week period.   Baseline:   Goal status: new at recert      ASSESSMENT:   CLINICAL IMPRESSION: Patient is a 61 y.o. M presenting with cognition and expressive language deficits s/p stroke. Pt continues to present with mild aphasia notable for difficulty with mod-complex verbal expression, occasional anomia, mazing. Less frequent vague speech and word finding difficulties observed in structured tasks and in conversational speech. Decreased mental flexibility, impaired attention, and memory affecting ability to accurately and fluenty complete mod-complex tasks needed for return to work. Conducted ongoing education and training of strategies to aid completion of functional cognitive tasks with occasional min to mod A provided to  aid attention and accuracy. I recommend skilled ST to address cognitive communication skills to maximize return to baseline and facilitate return to work.    OBJECTIVE IMPAIRMENTS include aphasia. These impairments are limiting patient from effectively communicating at home and in community. No overt factors affecting potential to achieve goals and functional outcomes. Patient will benefit from skilled SLP services to address above impairments and improve overall function.   REHAB POTENTIAL: Excellent   PLAN: SLP FREQUENCY: 2x/week   SLP DURATION: 18 weeks (including +4 weeks for recertification)     PLANNED INTERVENTIONS: Language facilitation, Cueing hierachy, Cognitive reorganization, Internal/external aids, Functional tasks, SLP instruction and feedback, Compensatory strategies, and Patient/family education   Su Monks, CCC-SLP 08/10/2021, 1:58 PM

## 2021-08-10 NOTE — Patient Instructions (Signed)
Your goal is to increase your endurance and stamina for returning to work: consider increasing your work load throughout the day    Water Intake:  Try and finish a cup of water every three hours Set a timer to mark when you should be done  At work, set a goal for drinking a large water bottle full or X amount of bottles

## 2021-08-13 ENCOUNTER — Telehealth: Payer: Self-pay | Admitting: Neurology

## 2021-08-13 NOTE — Telephone Encounter (Signed)
[employer] Stroke Research Study Note  Patient was seen today for the 90-day study research office follow-up visit.  He states he is doing well.  He had no recurrent stroke or TIA symptoms.  He is tolerating study medications well except mild bruising.  He is on Plavix and statin medication.  He complains of some mild cognitive difficulties in the form of calculations and remembering numbers but otherwise is doing fine. On 06/27/2021 while doing outpatient speech therapy patient had a brief episode of dizziness and feeling like he is going to fall out but did not and managed to be lowered to the ground.  Did not lose consciousness.  Did not have any focal neurological symptoms.  He was seen in the emergency room at [facility] and thought to be dehydrated.BMP showed elevated potassium 5.2 and creatinine of 1.69 which is higher than his baseline.  He was treated with 1 L of lactated Ringer's as bolus.  Ultrasound of the abdomen was negative for ascites.  Chest x-ray showed possible pulmonary venous congestion without overt edema.  Patient improved and was discharged  NIH stroke scale today 0 Modified Rankin scale 2  Patient's ER visit on 06/27/2021 will be reported as an AE.  We do not believe this is related to the study medication.  Patient was encouraged to continue Plavix and statin medication and keep scheduled study follow-up.  He and wife were given opportunity to ask questions about the study and these were answered to their satisfaction.  Delia Heady MD

## 2021-08-19 NOTE — Progress Notes (Unsigned)
Future Appointments  Date Time Provider Department  08/20/2021                 3:30 PM Lucky Cowboy, MD [facility]  12/17/2021 10:00 AM Shawnie Dapper, NP [facility]  07/24/2022                  cpe  3:00 PM Lucky Cowboy, MD [facility]   History of Present Illness:       The patient is a very nice 62 y.o. MWM with  HTN, HLD, T2_NIDDM, ASCVD, s/p Rt BKA and Vitamin D Deficiency who returns for a 1 month f/u                                                          In May 07, 2021, patient had a Lt Frontal lobe CVA with aphasia for which he continues in Speech & language therapy. Patient has been out of work during that time .         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   Medications  Current Outpatient Medications (Endocrine & Metabolic):    Dulaglutide (TRULICITY) 1.5 MG/0.5ML SOPN, INJECT 0.5ML (1.5MG ) INTO THE SKIN ONCE WEEKLY   metFORMIN (GLUCOPHAGE-XR) 500 MG 24 hr tablet, Take 2 tablets 2 x /day with Meals  for Diabetes (Patient taking differently: 1,000 mg 2 (two) times daily. Take 1 tablet in the morning and 2 in the evening)  Current Outpatient Medications (Cardiovascular):    hydrochlorothiazide (HYDRODIURIL) 25 MG tablet, Take  1 tablet  Daily  for BP & Fluid Retention / Ankle Swelling (Patient taking differently: Take 12.5 mg by mouth daily. for BP & Fluid Retention / Ankle Swelling)   lisinopril (ZESTRIL) 20 MG tablet, Take 1 tablet Daily for BP & Kidney Protection   rosuvastatin (CRESTOR) 40 MG tablet, Take one tablet daily for Cholesterol. (Patient taking differently: Take 40 mg by mouth daily. Take one tablet daily for Cholesterol.)   verapamil (CALAN-SR) 240 MG CR tablet, Take 1 tablet Daily for BP & Heart Rhythm                                /                         TAKE                              BY                       MOUTH                  ONCE DAILY  Current Outpatient Medications (Respiratory):    cetirizine (ZYRTEC) 10 MG  tablet, Take 10 mg by mouth daily as needed for allergies.  Current Outpatient Medications (Analgesics):    aspirin 81 MG tablet, Take 1 tablet (81 mg total) by mouth daily. Take along with plavix for 3 months, after 3 months stop aspirin and continue plavix  Current Outpatient Medications (Hematological):    clopidogrel (PLAVIX) 75 MG tablet, Take 1 tablet (75 mg total) by mouth  daily.  Current Outpatient Medications (Other):    Cholecalciferol (VITAMIN D) 50 MCG (2000 UT) CAPS, Take 2,000 Units by mouth 2 (two) times daily.   citalopram (CELEXA) 40 MG tablet, Take 1 tablet Daily for Mood & Chronic Anxiety   pantoprazole (PROTONIX) 40 MG tablet, Take 1 tablet (40 mg total) by mouth daily.   Study - OCEANIC-STROKE - asundexian 50 mg or placebo tablet (PI-Sethi), Take 1 tablet (50 mg total) by mouth daily. For investigational use only. Take 1 tablet by mouth once daily at the same time each day, preferably in the morning. Please bring bottle to every visit.  Problem list He has Essential hypertension; Hyperlipidemia associated with type 2 diabetes mellitus ([name]); Vitamin D deficiency; History of kidney stones; Testosterone deficiency; History of hepatitis C; Medication management; Morbid obesity (BMI 35); CKD stage 2 due to type 2 diabetes mellitus ([name]); T2_NIDDM w/Peripheral Neuropathy; Smoker; COPD (chronic obstructive pulmonary disease) ([name]); Insomnia; FHx: heart disease; Type 2 diabetes mellitus with stage 2 chronic kidney disease, without long-term current use of insulin ([name]); Poorly controlled diabetes mellitus ([name]); Cutaneous abscess of right foot; Hyponatremia; Right below-knee amputee ([name]); Aphasia; Leukocytosis; and Cerebral thrombosis with cerebral infarction on their problem list.   Observations/Objective:  There were no vitals taken for this visit.  HEENT - WNL. Neck - supple.  Chest - Clear equal BS. Cor - Nl HS. RRR w/o sig MGR. PP 1(+). No edema. MS- FROM w/o deformities.   Gait Nl. Neuro -  Nl w/o focal abnormalities.   Assessment and Plan:      Follow Up Instructions:        I discussed the assessment and treatment plan with the patient. The patient was provided an opportunity to ask questions and all were answered. The patient agreed with the plan and demonstrated an understanding of the instructions.       The patient was advised to call back or seek an in-person evaluation if the symptoms worsen or if the condition fails to improve as anticipated.    Marinus Maw, MD

## 2021-08-20 ENCOUNTER — Other Ambulatory Visit: Payer: Self-pay | Admitting: Internal Medicine

## 2021-08-20 ENCOUNTER — Ambulatory Visit (INDEPENDENT_AMBULATORY_CARE_PROVIDER_SITE_OTHER): Payer: BC Managed Care – PPO | Admitting: Internal Medicine

## 2021-08-20 ENCOUNTER — Encounter: Payer: Self-pay | Admitting: Internal Medicine

## 2021-08-20 VITALS — BP 140/70 | HR 77 | Temp 97.9°F | Resp 16 | Ht 74.0 in | Wt 259.0 lb

## 2021-08-20 DIAGNOSIS — N182 Chronic kidney disease, stage 2 (mild): Secondary | ICD-10-CM | POA: Diagnosis not present

## 2021-08-20 DIAGNOSIS — E1122 Type 2 diabetes mellitus with diabetic chronic kidney disease: Secondary | ICD-10-CM | POA: Diagnosis not present

## 2021-08-20 DIAGNOSIS — I10 Essential (primary) hypertension: Secondary | ICD-10-CM | POA: Diagnosis not present

## 2021-08-20 MED ORDER — TRULICITY 3 MG/0.5ML ~~LOC~~ SOAJ: SUBCUTANEOUS | 0 refills | Status: DC

## 2021-08-20 NOTE — Patient Instructions (Signed)

## 2021-08-27 DIAGNOSIS — Z1389 Encounter for screening for other disorder: Secondary | ICD-10-CM | POA: Diagnosis not present

## 2021-09-05 ENCOUNTER — Other Ambulatory Visit: Payer: Self-pay | Admitting: Nurse Practitioner

## 2021-09-17 ENCOUNTER — Other Ambulatory Visit: Payer: Self-pay | Admitting: Internal Medicine

## 2021-09-17 MED ORDER — TRULICITY 4.5 MG/0.5ML ~~LOC~~ SOAJ: 4.5000 mg | SUBCUTANEOUS | 3 refills | Status: DC

## 2021-10-04 ENCOUNTER — Other Ambulatory Visit: Payer: Self-pay | Admitting: Nurse Practitioner

## 2021-10-05 ENCOUNTER — Other Ambulatory Visit: Payer: Self-pay | Admitting: Internal Medicine

## 2021-10-17 ENCOUNTER — Ambulatory Visit: Payer: BC Managed Care – PPO | Admitting: Family

## 2021-10-17 DIAGNOSIS — Z89511 Acquired absence of right leg below knee: Secondary | ICD-10-CM

## 2021-10-19 ENCOUNTER — Encounter: Payer: Self-pay | Admitting: Family

## 2021-10-19 NOTE — Progress Notes (Signed)
Office Visit Note   Patient: [name]           Date of Birth: 1960-11-27           MRN: [id number] Visit Date: 10/17/2021              Requested by: [name], [address] [place],  [place] [address] PCP: [name], MD  Chief Complaint  Patient presents with   Right Leg - Follow-up    Hx BKA 09/01/2019 Prosthetic supplies rx       HPI: The patient is a 61 year old gentleman seen for evaluation of his right residual limb.  He is status post right below-knee amputation August 2021.  He has been having difficulty with an ill fitting socket.  He feels that the post for his liner is no longer adequately long enough to click and lock with his socket due to other modifications and padding which have been placed in his prosthesis.  His socks and liners are worn out and broken down.  He needs new prosthesis supplies  Currently not having any issues with calluses or ulcers  Assessment & Plan: Visit Diagnoses: No diagnosis found.  Plan: Given an order for prosthesis supplies he will follow-up in the office on an as-needed basis  Follow-Up Instructions: No follow-ups on file.   Ortho Exam  Patient is alert, oriented, no adenopathy, well-dressed, normal affect, normal respiratory effort. On examination of the right residual limb there is no callus no ulcer no erythema no skin breakdown his limb is well consolidated Imaging: No results found. No images are attached to the encounter.  Labs: Lab Results  Component Value Date   HGBA1C 6.4 (H) 07/19/2021   HGBA1C 7.5 (H) 05/09/2021   HGBA1C 8.2 (H) 04/18/2021   LABURIC 6.0 05/09/2021   REPTSTATUS 09/06/2019 FINAL 09/01/2019   GRAMSTAIN  09/01/2019    MODERATE WBC PRESENT, PREDOMINANTLY PMN NO ORGANISMS SEEN    CULT  09/01/2019    No growth aerobically or anaerobically. Performed at [facility], [place] [address], [place], [place] [address]    LABORGA STREPTOCOCCUS ANGINOSIS  08/20/2019     Lab Results  Component Value Date   ALBUMIN 3.8 05/07/2021   ALBUMIN 2.0 (L) 09/06/2019   ALBUMIN 2.0 (L) 08/26/2019    Lab Results  Component Value Date   MG 2.0 07/19/2021   MG 2.2 01/18/2021   MG 2.2 07/19/2020   Lab Results  Component Value Date   VD25OH 74 07/19/2021   VD25OH 67 01/18/2021   VD25OH 81 07/19/2020    No results found for: "PREALBUMIN"    Latest Ref Rng & Units 07/19/2021    2:55 PM 06/29/2021   10:28 AM 06/27/2021    4:22 PM  CBC EXTENDED  WBC 3.8 - 10.8 Thousand/uL 8.5  9.3  8.8   RBC 4.20 - 5.80 Million/uL 4.21  4.57  3.73   Hemoglobin 13.2 - 17.1 g/dL 13.8  14.8  12.2   HCT 38.5 - 50.0 % 40.5  42.3  36.8   Platelets 140 - 400 Thousand/uL 205  226  182   NEUT# 1,500 - 7,800 cells/uL 4,352  5,413    Lymph# 850 - 3,900 cells/uL 3,222  2,613       There is no height or weight on file to calculate BMI.  Orders:  No orders of the defined types were placed in this encounter.  No orders of the defined types were placed in  this encounter.    Procedures: No procedures performed  Clinical Data: No additional findings.  ROS:  All other systems negative, except as noted in the HPI. Review of Systems  Objective: Vital Signs: There were no vitals taken for this visit.  Specialty Comments:  No specialty comments available.  PMFS History: Patient Active Problem List   Diagnosis Date Noted   Cerebral thrombosis with cerebral infarction 05/09/2021   Aphasia 05/08/2021   Leukocytosis 05/08/2021   Right below-knee amputee ([place]) 09/03/2019   Hyponatremia 08/26/2019   Cutaneous abscess of right foot    Poorly controlled diabetes mellitus ([place]) 07/16/2019   FHx: heart disease 10/08/2017   Type 2 diabetes mellitus with stage 2 chronic kidney disease, without long-term current use of insulin ([place]) 10/08/2017   Insomnia 07/08/2017   Smoker 08/17/2015   COPD (chronic obstructive pulmonary disease) ([place]) 08/17/2015   T2_NIDDM  w/Peripheral Neuropathy 04/01/2014   CKD stage 2 due to type 2 diabetes mellitus ([place]) 08/31/2013   Medication management 05/19/2013   Morbid obesity (BMI 35) 05/19/2013   Essential hypertension    Hyperlipidemia associated with type 2 diabetes mellitus ([place])    Vitamin D deficiency    History of kidney stones    Testosterone deficiency    History of hepatitis C    Past Medical History:  Diagnosis Date   Diabetic neuropathy ([name])    Diverticulitis    History of hepatitis C    has been treated in the past   History of kidney stones    Hyperlipidemia    Hypertension    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[place])    Other testicular hypofunction    Severe sepsis ([place]) 08/26/2019   Type II or unspecified type diabetes mellitus without mention of complication, not stated as uncontrolled    Vitamin D deficiency     Family History  Problem Relation Age of Onset   Hypertension Mother    Cancer Father        colon   Alzheimer's disease Father    Stroke Father     Past Surgical History:  Procedure Laterality Date   AMPUTATION Right 09/01/2019   Procedure: RIGHT BELOW KNEE AMPUTATION;  Surgeon: Newt Minion, MD;  Location: [place];  Service: Orthopedics;  Laterality: Right;   CHOLECYSTECTOMY  1987   COLONOSCOPY     I & D EXTREMITY Right 08/20/2019   Procedure: IRRIGATION & DEBRIDEMENT RIGHT FOOT PARTIAL CALCANEAL EXCISION;  Surgeon: Newt Minion, MD;  Location: [place];  Service: Orthopedics;  Laterality: Right;   I & D EXTREMITY Right 08/27/2019   Procedure: IRRIGATION AND DEBRIDEMENT  OF FOOT;  Surgeon: Leandrew Koyanagi, MD;  Location: [facility];  Service: Orthopedics;  Laterality: Right;   ORIF TIBIA FRACTURE     x 3 right leg   Social History   Occupational History   Not on file  Tobacco Use   Smoking status: Former    Packs/day: 1.00    Types: Cigarettes    Quit date: 05/07/2021    Years since quitting: 0.4   Smokeless tobacco: Never  Vaping Use   Vaping Use: Never used  Substance and  Sexual Activity   Alcohol use: No   Drug use: Yes    Types: Marijuana   Sexual activity: Not on file

## 2021-11-20 DIAGNOSIS — Z89511 Acquired absence of right leg below knee: Secondary | ICD-10-CM | POA: Diagnosis not present

## 2021-11-20 NOTE — Progress Notes (Unsigned)
FOLLOW UP 3 MONTH  Assessment and Plan:   [name] was seen today for follow-up.  Diagnoses and all orders for this visit:  Essential Hypertension Continue lisinopril and verapamil and HCTZ 25 mg 1/2 tab daily Monitor blood pressure at home; patient to call if consistently greater than 130/80 Continue DASH diet.   Reminder to go to the ER if any CP, SOB, nausea, dizziness, severe HA, changes vision/speech, left arm numbness and tingling and jaw pain. -     CBC with Differential/Platelet -     COMPLETE METABOLIC PANEL WITH GFR  Hyperlipidemia associated with type 2 diabetes mellitus ([name]) Taking rosuvastatin 51m daily Currently goal of LDL <70;  Continue low cholesterol diet and exercise.  -     Lipid panel  Type 2 diabetes mellitus with stage 2 chronic kidney disease, without long-term current use of insulin ([name]) Discussed dietary and exercise modifications Continue Glucophage 500 mg 2 tab BID a Will d/c glipizide Would like to try Trulicity as blood sugars are still not controlled.  Keep blood sugar log daily and bring to next visit -     Hemoglobin A1c  Vitamin D deficiency Continue supplementation to maintain goal of 60-100  Class 2 severe obesity  associated with comorbidity([name]) Long discussion about weight loss, diet, and exercise Recommended diet heavy in fruits and veggies and low in animal meats, cheeses, and dairy products, appropriate calorie intake Has lost 21 pounds in past 4 months Follow up at next visit    Tobacco use Discussed risks associated with tobacco use and advised to reduce or quit He is not currently ready Will follow up at the next visit  COPD([name]) Advised to stop smoking - risks discussed. Declines medications.  Currently stable without respiratory medications  R BTK amputation ([place] Stump healed well; completed PT New sleeve fitted yesterday  Pressure injury of skin of stump of below knee amputation Area is not open, appears to be  callus Continue to monitor and if develops redness or opens notify the office      Continue diet and meds as discussed. Further disposition pending results of labs. Discussed med's effects and SE's.   Over 30 minutes of face to face interview, exam, counseling, chart review, and critical decision making was performed.   Future Appointments  Date Time Provider [place] 12/17/2021 10:00 AM LDebbora Presto NP [facility] None  07/24/2022  3:00 PM MUnk Pinto MD [facility] None    ----------------------------------------------------------------------------------------------------------------------  HPI 61y.o. male  presents for 3 month follow up on hypertension, cholesterol, diabetes, weight and vitamin D deficiency.  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 He did have a sore on bottom of stump about 4 months ago and did get a new sleeve yesterday.   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    BMI is Body mass index is 31.23 kg/m., he has been working on diet and exercise, working with PT, watching portions, his appetite is much smaller from the Trulicity- he has lost 21 pounds in the past 4 months.  Wt Readings from Last 3 Encounters:  11/21/21 243 lb 3.2 oz (110.3 kg)  08/20/21 259 lb (117.5 kg)  07/19/21 264 lb (119.7 kg)   His blood pressure has been controlled at home with Lisinopril 20 mg QD, HCTZ 25 mg QD, and verapamil 240 mg QD, today their BP is BP: 108/60  BP Readings from Last 3 Encounters:  11/21/21 108/60  08/20/21 140/70  07/19/21 119/60     He does workout. He denies chest pain, shortness of breath, dizziness.   He is on cholesterol  medication rosuvastatin 75m daily and denies myalgias. His cholesterol is at goal. The cholesterol last visit was:   Lab Results  Component Value Date   CHOL 110 07/19/2021   HDL 27 (L) 07/19/2021   LDLCALC 55 07/19/2021   TRIG 224 (H) 07/19/2021   CHOLHDL 4.1 07/19/2021    He has been working on diet and exercise for T2 diabetes  Neuropathy in bil feet, some numbness and weakness in hands and feet, was on gabapentin but recently improved Hyperlipidemia- Rosuvastatin 40 mg Metformin 5053mone in morning and two in evening. Trulicity 4.5 mg SQ QW denies foot ulcerations, increased appetite, nausea, polydipsia, polyuria, visual disturbances, vomiting and weight loss.  He checks sugars occasionally, low 200's Last A1C in the office was:  Lab Results  Component Value Date   HGBA1C 6.4 (H) 07/19/2021    He has been drinking an IV hydration drink and drinks water throughout the day. Gets up 0-1 time a night to urinate. Lab Results  Component Value Date   EGFR 89 07/19/2021    Patient is on Vitamin D supplement and at goal at recent check:    Lab Results  Component Value Date   VD25OH 74 07/19/2021         Current Medications:  Current Outpatient Medications on File Prior to Visit  Medication Sig   aspirin 81 MG tablet Take 1 tablet (81 mg total) by mouth daily. Take along with plavix for 3 months, after 3 months stop aspirin and continue plavix   cetirizine (ZYRTEC) 10 MG tablet Take 10 mg by mouth daily as needed for allergies.   Cholecalciferol (VITAMIN D) 50 MCG (2000 UT) CAPS Take 2,000 Units by mouth 2 (two) times daily.   citalopram (CELEXA) 40 MG tablet Take 1 tablet Daily for Mood & Chronic Anxiety   clopidogrel (PLAVIX) 75 MG tablet Take 1 tablet (75 mg total) by mouth daily.   Dulaglutide (TRULICITY) 4.5 MGTJ/0.3ESOPN Inject 4.5 mg as directed once a week. for Diabetes  (Dx: e11.29 )   hydrochlorothiazide (HYDRODIURIL) 25 MG tablet Take  1 tablet  Daily  for BP &  Fluid Retention / Ankle Swelling (Patient taking differently: Take 12.5 mg by mouth daily. for BP & Fluid Retention / Ankle Swelling)   lisinopril (ZESTRIL) 20 MG tablet Take  1 tablet  Daily for BP & Diabetic Kidney Protection                                /                                     TAKE                                 BY MOUTH    BY MOUTH  ONCE DAILY   metFORMIN (GLUCOPHAGE-XR) 500 MG 24 hr tablet Take 2 tablets 2 x /day with Meals  for Diabetes (Patient taking differently: 1,000 mg 2 (two) times daily. Take 1 tablet in the morning and 2 in the evening)   pantoprazole (PROTONIX) 40 MG tablet Take 1 tablet by mouth once daily   rosuvastatin (CRESTOR) 40 MG tablet Take one tablet daily for Cholesterol. (Patient taking differently: Take 40 mg by mouth daily. Take one tablet daily for Cholesterol.)   Study - OCEANIC-STROKE - asundexian 50 mg or placebo tablet (PI-Sethi) Take 1 tablet (50 mg total) by mouth daily. For investigational use only. Take 1 tablet by mouth once daily at the same time each day, preferably in the morning. Please bring bottle to every visit.   verapamil (CALAN-SR) 240 MG CR tablet Take 1 tablet Daily for BP & Heart Rhythm                                /                         TAKE                              BY                       MOUTH                  ONCE DAILY   No current facility-administered medications on file prior to visit.     Allergies:  Allergies  Allergen Reactions   Invokamet [Canagliflozin-Metformin Hcl] Other (See Comments)    Extremity edema/caused pain   Invokana [Canagliflozin] Other (See Comments)    Extremity edema/caused pain   Codeine Camsylate [Codeine] Rash   Morphine And Related Rash     Medical History:  Past Medical History:  Diagnosis Date   Diabetic neuropathy ([place])    Diverticulitis    History of hepatitis C    has been treated in the past   History of kidney stones    Hyperlipidemia     Hypertension    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[place])    Other testicular hypofunction    Severe sepsis ([place]) 08/26/2019   Type II or unspecified type diabetes mellitus without mention of complication, not stated as uncontrolled    Vitamin D deficiency    Family history- Reviewed and unchanged Social history- Reviewed and unchanged   Review of Systems:  Review of Systems  Constitutional:  Negative for malaise/fatigue and weight loss.  HENT:  Negative for hearing loss and tinnitus.   Eyes:  Negative for blurred vision and double vision.  Respiratory:  Negative for cough, shortness of breath and wheezing.   Cardiovascular:  Negative for chest pain, palpitations, orthopnea, claudication and leg swelling.  Gastrointestinal:  Negative for abdominal pain, blood in stool, constipation, diarrhea, heartburn, melena, nausea and vomiting.  Genitourinary: Negative.   Musculoskeletal:  Negative for joint pain and myalgias.  Skin:  Negative for rash.       Callus of [name] stump  Neurological:  Negative for dizziness, tingling, sensory change, weakness and headaches.  Endo/Heme/Allergies:  Negative for polydipsia.  Psychiatric/Behavioral:  Negative for depression, hallucinations, memory loss and substance abuse. The patient is not nervous/anxious.   All other systems reviewed  and are negative.    Physical Exam: BP 108/60   Pulse 88   Temp (!) 97.5 F (36.4 C)   Ht _0  (1.88 m)   Wt 243 lb 3.2 oz (110.3 kg)   SpO2 97%   BMI 31.23 kg/m  Wt Readings from Last 3 Encounters:  11/21/21 243 lb 3.2 oz (110.3 kg)  08/20/21 259 lb (117.5 kg)  07/19/21 264 lb (119.7 kg)   General Appearance: Well nourished, in no apparent distress. Eyes: PERRLA, EOMs, conjunctiva no swelling or erythema Sinuses: No Frontal/maxillary tenderness ENT/Mouth: Ext aud canals clear, TMs without erythema, bulging. No erythema, swelling, or exudate on post pharynx.  Tonsils not swollen or erythematous. Hearing normal.   Neck: Supple, thyroid normal. No carotid bruits heard Respiratory: Respiratory effort normal, BS equal bilaterally with diffuse wheezing without rales, rhonchi,  or stridor.  Cardio: RRR with no MRGs. Brisk peripheral pulses without edema.  Abdomen: Soft, + BS.  Non tender, no guarding, rebound, hernias, masses. Lymphatics: Non tender without lymphadenopathy.  Musculoskeletal:; R BTK amputation w/ prosthesis. Otherwise no deformity. Symmetrical upper extremity strength.  Skin: Warm, dry without. 1-2 cm callus on right stump, no signs of infection Neuro: Cranial nerves intact. No cerebellar symptoms.  Psych: Awake and oriented X 3, normal affect, Insight and Judgment appropriate.    Alycia Rossetti, NP 3:46 PM [facility]

## 2021-11-21 ENCOUNTER — Ambulatory Visit (INDEPENDENT_AMBULATORY_CARE_PROVIDER_SITE_OTHER): Payer: BC Managed Care – PPO | Admitting: Nurse Practitioner

## 2021-11-21 ENCOUNTER — Encounter: Payer: Self-pay | Admitting: Nurse Practitioner

## 2021-11-21 VITALS — BP 108/60 | HR 88 | Temp 97.5°F | Ht 74.0 in | Wt 243.2 lb

## 2021-11-21 DIAGNOSIS — T8789 Other complications of amputation stump: Secondary | ICD-10-CM

## 2021-11-21 DIAGNOSIS — E1122 Type 2 diabetes mellitus with diabetic chronic kidney disease: Secondary | ICD-10-CM

## 2021-11-21 DIAGNOSIS — J449 Chronic obstructive pulmonary disease, unspecified: Secondary | ICD-10-CM

## 2021-11-21 DIAGNOSIS — E785 Hyperlipidemia, unspecified: Secondary | ICD-10-CM | POA: Diagnosis not present

## 2021-11-21 DIAGNOSIS — N182 Chronic kidney disease, stage 2 (mild): Secondary | ICD-10-CM

## 2021-11-21 DIAGNOSIS — I10 Essential (primary) hypertension: Secondary | ICD-10-CM

## 2021-11-21 DIAGNOSIS — E1169 Type 2 diabetes mellitus with other specified complication: Secondary | ICD-10-CM

## 2021-11-21 DIAGNOSIS — L89899 Pressure ulcer of other site, unspecified stage: Secondary | ICD-10-CM

## 2021-11-21 DIAGNOSIS — E559 Vitamin D deficiency, unspecified: Secondary | ICD-10-CM

## 2021-11-21 DIAGNOSIS — Z89511 Acquired absence of right leg below knee: Secondary | ICD-10-CM

## 2021-11-21 DIAGNOSIS — F17200 Nicotine dependence, unspecified, uncomplicated: Secondary | ICD-10-CM

## 2021-11-21 NOTE — Patient Instructions (Signed)

## 2021-11-22 LAB — COMPLETE METABOLIC PANEL WITH GFR
AG Ratio: 1.6 (calc) (ref 1.0–2.5)
ALT: 40 U/L (ref 9–46)
AST: 31 U/L (ref 10–35)
Albumin: 4.3 g/dL (ref 3.6–5.1)
Alkaline phosphatase (APISO): 56 U/L (ref 35–144)
BUN: 23 mg/dL (ref 7–25)
CO2: 23 mmol/L (ref 20–32)
Calcium: 9.7 mg/dL (ref 8.6–10.3)
Chloride: 101 mmol/L (ref 98–110)
Creat: 0.91 mg/dL (ref 0.70–1.35)
Globulin: 2.7 g/dL (calc) (ref 1.9–3.7)
Glucose, Bld: 82 mg/dL (ref 65–99)
Potassium: 4.7 mmol/L (ref 3.5–5.3)
Sodium: 137 mmol/L (ref 135–146)
Total Bilirubin: 0.5 mg/dL (ref 0.2–1.2)
Total Protein: 7 g/dL (ref 6.1–8.1)
eGFR: 95 mL/min/{1.73_m2} (ref >=60)

## 2021-11-22 LAB — CBC WITH DIFFERENTIAL/PLATELET
Absolute Monocytes: 1082 cells/uL — ABNORMAL HIGH (ref 200–950)
Basophils Absolute: 79 cells/uL (ref 0–200)
Basophils Relative: 0.6 %
Eosinophils Absolute: 158 cells/uL (ref 15–500)
Eosinophils Relative: 1.2 %
HCT: 39.7 % (ref 38.5–50.0)
Hemoglobin: 14.1 g/dL (ref 13.2–17.1)
Lymphs Abs: 4052 cells/uL — ABNORMAL HIGH (ref 850–3900)
MCH: 33.3 pg — ABNORMAL HIGH (ref 27.0–33.0)
MCHC: 35.5 g/dL (ref 32.0–36.0)
MCV: 93.6 fL (ref 80.0–100.0)
MPV: 12.2 fL (ref 7.5–12.5)
Monocytes Relative: 8.2 %
Neutro Abs: 7828 cells/uL — ABNORMAL HIGH (ref 1500–7800)
Neutrophils Relative %: 59.3 %
Platelets: 224 10*3/uL (ref 140–400)
RBC: 4.24 10*6/uL (ref 4.20–5.80)
RDW: 12.1 % (ref 11.0–15.0)
Total Lymphocyte: 30.7 %
WBC: 13.2 10*3/uL — ABNORMAL HIGH (ref 3.8–10.8)

## 2021-11-22 LAB — LIPID PANEL
Cholesterol: 102 mg/dL (ref <200)
HDL: 25 mg/dL — ABNORMAL LOW (ref >=40)
LDL Cholesterol (Calc): 50 mg/dL (calc)
Non-HDL Cholesterol (Calc): 77 mg/dL (calc) (ref <130)
Total CHOL/HDL Ratio: 4.1 (calc) (ref <5.0)
Triglycerides: 207 mg/dL — ABNORMAL HIGH (ref <150)

## 2021-11-22 LAB — HEMOGLOBIN A1C
Hgb A1c MFr Bld: 5.6 % of total Hgb (ref <5.7)
Mean Plasma Glucose: 114 mg/dL
eAG (mmol/L): 6.3 mmol/L

## 2021-11-30 ENCOUNTER — Other Ambulatory Visit: Payer: Self-pay | Admitting: Internal Medicine

## 2021-12-04 DIAGNOSIS — Z89511 Acquired absence of right leg below knee: Secondary | ICD-10-CM | POA: Diagnosis not present

## 2021-12-11 NOTE — Patient Instructions (Signed)
Below is our plan:  We will continue to monitor. Continue close follow up with PCP.   Please make sure you are staying well hydrated. I recommend 50-60 ounces daily. Well balanced diet and regular exercise encouraged. Consistent sleep schedule with 6-8 hours recommended.   Please continue follow up with care team as directed.   Follow up with [place] as needed   You may receive a survey regarding today's visit. I encourage you to leave honest feed back as I do use this information to improve patient care. Thank you for seeing me today!

## 2021-12-11 NOTE — Progress Notes (Signed)
[facility] [address] [place]. [place] [id number] [phone]       HOSPITAL FOLLOW UP NOTE  Mr. [name] Date of Birth:  1960/02/13 Medical Record Number:  [id number]   Reason for Referral:  hospital stroke follow up   SUBJECTIVE:   CHIEF COMPLAINT:  Chief Complaint  Patient presents with   Follow-up    RM 10, alone. Doing well since last visit. Stopped ASA and continued plavix.    HPI:   [name] is a 61 y.o. who  has a past medical history of Diabetic neuropathy ([name]), Diverticulitis, History of hepatitis C, History of kidney stones, Hyperlipidemia, Hypertension, 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[name]), Other testicular hypofunction, Severe sepsis ([name]) (08/26/2019), Type II or unspecified type diabetes mellitus without mention of complication, not stated as uncontrolled, and Vitamin D deficiency.  Patient presented on 05/07/2021 with sudden onset of aphasia. 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 UDS positive for THC. Outside window for tPA. He was started on asa and Plavix for three months then Plavix alone. He was discharged home 05/09/2021. No recommendations from PT/OT. ST recommended outpatient therapy. Personally reviewed hospitalization pertinent progress notes, lab work and imaging.  Evaluated by Dr Roda ShuttersXu.   Since discharge home, he is doing fairly well. He continues to have difficulty finding correct words. He has difficulty with recall. He has difficulty with day/time orientation. He has not returned to work. He continues to worth with ST twice weekly. He has some difficulty with irritability and frustration. He continues close follow up with Dr Oneta RackMcKeown. Now taking citalopram.   CBGs were running 250+ but now seem to be closer to 110-120 in the mornings. He is tolerating metformin and Trulicity. He reports BP has been much lower at home. He continues verapamil and lisinopril. He continues  rosuvastatin, asa 81mg  and Plavix.   [place]. He has 30 day follow up with research, today.   UPDATE 12/17/2021 ALL: [name] MaduroRobert returns for follow up following CVA in 04/2021. He reports doing well.   He has completed ST. 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 He feels that this helped with cognitive function. He continues to have difficulty with typing. He has trouble with numbers at times. He has talked with his employer and has decided to retire. Physical functioning is baseline. He may wobble some if tired. Right lower amputee. Wears prosthesis.   He has stopped asa. He continues Plavix and atorvastatin 40mg  daily. Last A1C was 6.5, LDL 50. BP normal. Unfortunately, he has resumed smoking.    PERTINENT IMAGING/LABS  CT old left MCA/PCA small infarct CTA head and neck left ICA occlusion likely chronic, right ICA 40% stenosis MRI  acute left frontal infarct, old left frontoparietal infarct 2D Echo  60-65%   A1C Lab Results  Component Value Date   HGBA1C 5.6 11/21/2021    Lipid Panel     Component Value Date/Time   CHOL 102 11/21/2021 1604   TRIG 207 (H) 11/21/2021 1604   HDL 25 (L) 11/21/2021 1604   CHOLHDL 4.1 11/21/2021 1604   VLDL 45 (H) 05/09/2021 0312   LDLCALC 50 11/21/2021 1604    ROS:   14 system review of systems performed and negative with exception of those listed in HPI  PMH:  Past Medical History:  Diagnosis Date   Diabetic neuropathy ([name])    Diverticulitis    History of hepatitis C  has been treated in the past   History of kidney stones    Hyperlipidemia    Hypertension    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[name])    Other testicular hypofunction    Severe sepsis ([name]) 08/26/2019   Type II or unspecified type diabetes mellitus without mention of complication, not stated as uncontrolled    Vitamin D deficiency     PSH:  Past Surgical History:  Procedure Laterality Date   AMPUTATION Right 09/01/2019   Procedure: RIGHT BELOW  KNEE AMPUTATION;  Surgeon: Michael Mustard, MD;  Location: [facility] OR;  Service: Orthopedics;  Laterality: Right;   CHOLECYSTECTOMY  1987   COLONOSCOPY     I & D EXTREMITY Right 08/20/2019   Procedure: IRRIGATION & DEBRIDEMENT RIGHT FOOT PARTIAL CALCANEAL EXCISION;  Surgeon: Michael Mustard, MD;  Location: [facility] OR;  Service: Orthopedics;  Laterality: Right;   I & D EXTREMITY Right 08/27/2019   Procedure: IRRIGATION AND DEBRIDEMENT  OF FOOT;  Surgeon: Michael Kos, MD;  Location: [facility] OR;  Service: Orthopedics;  Laterality: Right;   ORIF TIBIA FRACTURE     x 3 right leg    Social History:  Social History   Socioeconomic History   Marital status: Married    Spouse name: [name]   Number of children: 0   Years of education: Not on file   Highest education level: High school graduate  Occupational History   Not on file  Tobacco Use   Smoking status: Former    Packs/day: 1.00    Types: Cigarettes    Quit date: 05/07/2021    Years since quitting: 0.6   Smokeless tobacco: Never  Vaping Use   Vaping Use: Never used  Substance and Sexual Activity   Alcohol use: No   Drug use: Yes    Types: Marijuana   Sexual activity: Not on file  Other Topics Concern   Not on file  Social History Narrative   Lives with wife [name]   L handed   Caffeine: 2 C of coffee a day and 2-3 diet mth dews a day   Social Determinants of Health   Financial Resource Strain: Not on file  Food Insecurity: Not on file  Transportation Needs: Not on file  Physical Activity: Not on file  Stress: Not on file  Social Connections: Not on file  Intimate Partner Violence: Not on file    Family History:  Family History  Problem Relation Age of Onset   Hypertension Mother    Cancer Father        colon   Alzheimer's disease Father    Stroke Father     Medications:   Current Outpatient Medications on File Prior to Visit  Medication Sig Dispense Refill   cetirizine (ZYRTEC) 10 MG tablet Take 10 mg by mouth daily as  needed for allergies.     Cholecalciferol (VITAMIN D) 50 MCG (2000 UT) CAPS Take 2,000 Units by mouth 2 (two) times daily.     citalopram (CELEXA) 40 MG tablet TAKE 1 TABLET BY MOUTH ONCE DAILY FOR  MOOD  AND  CHRONIC  ANXIETY 30 tablet 0   clopidogrel (PLAVIX) 75 MG tablet Take 1 tablet (75 mg total) by mouth daily. 90 tablet 3   Dulaglutide (TRULICITY) 4.5 MG/0.5ML SOPN Inject 4.5 mg as directed once a week. for Diabetes  (Dx: e11.29 ) 9 mL 3   hydrochlorothiazide (HYDRODIURIL) 25 MG tablet Take  1 tablet  Daily  for BP & Fluid  Retention / Ankle Swelling (Patient taking differently: Take 12.5 mg by mouth daily. for BP & Fluid Retention / Ankle Swelling) 90 tablet 3   lisinopril (ZESTRIL) 20 MG tablet Take  1 tablet  Daily for BP & Diabetic Kidney Protection                                /                                     TAKE                                 BY MOUTH    BY MOUTH                    ONCE DAILY 90 tablet 3   metFORMIN (GLUCOPHAGE-XR) 500 MG 24 hr tablet Take 2 tablets 2 x /day with Meals  for Diabetes (Patient taking differently: 1,000 mg 2 (two) times daily. Take 1 tablet in the morning and 2 in the evening) 360 tablet 3   pantoprazole (PROTONIX) 40 MG tablet Take 1 tablet by mouth once daily 30 tablet 5   rosuvastatin (CRESTOR) 40 MG tablet Take one tablet daily for Cholesterol. (Patient taking differently: Take 40 mg by mouth daily. Take one tablet daily for Cholesterol.) 90 tablet 3   Study - OCEANIC-STROKE - asundexian 50 mg or placebo tablet (PI-Sethi) Take 1 tablet (50 mg total) by mouth daily. For investigational use only. Take 1 tablet by mouth once daily at the same time each day, preferably in the morning. Please bring bottle to every visit. 98 tablet 0   verapamil (CALAN-SR) 240 MG CR tablet Take 1 tablet Daily for BP & Heart Rhythm                                /                         TAKE                              BY                       MOUTH                  ONCE  DAILY 90 tablet 3   No current facility-administered medications on file prior to visit.    Allergies:   Allergies  Allergen Reactions   Invokamet [Canagliflozin-Metformin Hcl] Other (See Comments)    Extremity edema/caused pain   Invokana [Canagliflozin] Other (See Comments)    Extremity edema/caused pain   Codeine Camsylate [Codeine] Rash   Morphine And Related Rash    OBJECTIVE:  Physical Exam  Vitals:   12/17/21 0951  BP: 127/65  Pulse: 75  Weight: 244 lb (110.7 kg)  Height: 6\' 3"  (1.905 m)    Body mass index is 30.5 kg/m. No results found.     08/20/2021    8:54 PM  Depression screen PHQ 2/9  Decreased Interest 0  Down, Depressed, Hopeless 0  PHQ - 2 Score  0     General: well developed, well nourished, seated, in no evident distress Head: head normocephalic and atraumatic.   Neck: supple with no carotid or supraclavicular bruits Cardiovascular: regular rate and rhythm, no murmurs Musculoskeletal: no deformity Skin:  no rash/petichiae Vascular:  Normal pulses all extremities   Neurologic Exam Mental Status: Awake and fully alert. mild dysarthria due to poor dentition.  Oriented to place and time. Recent and remote memory intact. Attention span, concentration and fund of knowledge appropriate. Mood and affect appropriate.  Cranial Nerves: Fundoscopic exam reveals sharp disc margins. Pupils equal, briskly reactive to light. Extraocular movements full without nystagmus. Visual fields full to confrontation. Hearing intact. Facial sensation intact. Face, tongue, palate moves normally and symmetrically.  Motor: Normal bulk and tone. Normal strength in all tested extremity muscles Sensory.: intact to touch , pinprick , position and vibratory sensation.  Coordination: Rapid alternating movements normal in all extremities. Finger-to-nose and heel-to-shin performed accurately bilaterally. Gait and Station: Arises from chair without difficulty. Stance is normal. Gait  demonstrates normal stride length and balance with no assistive device. Tandem walk and heel toe not performed, today.  Reflexes: 1+ and symmetric.    NIHSS  0 Modified Rankin  0   ASSESSMENT: TYDEN KANN is a 61 y.o. year old male presenting to the ER 05/07/2021 following sudden onset of aphasia.. Vascular risk factors include HTN, HLD, DMT2 uncontrolled, left ICA stenosis, OVD s/p R BKA, previous stroke, obesity, tobacco abuse, THC positive.    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 Continue aspirin 81 mg daily and clopidogrel 75 mg daily  for three months then Plavix alone and rosuvastatin 40mg  daily  for secondary stroke prevention.  Discussed secondary stroke prevention measures and importance of close PCP follow up for aggressive stroke risk factor management. I have gone over the pathophysiology of stroke, warning signs and symptoms, risk factors and their management in some detail with instructions to go to the closest emergency room for symptoms of concern. HTN: BP goal <130/90.  Stable on verapamil 240mg  daily and lisinopril 20mg  daily per PCP. Avoid low BP due to left ICA stenosis.  HLD: LDL goal <70. Recent LDL 50. Continue rosuvastatin 40mg  daily per PCP.  DMII: A1c goal<7.0. Recent A1c 6.5. Continue metformin ER and Trulicity per PCP.  Tobacco abuse: consider smoking cessation  Left ICA stenosis: continue close follow up with PCP PVD s/p R BKA: continue close follow up with PCP  Obesity: healthy lifestyle habits with low carb diet and regular physical exercise. Tobacco abuse: consider cessation THC positive: consider cessation    Follow up as needed   CC:  [facility] provider: Dr. PCP: , MD    I spent 30 minutes of face-to-face and non-face-to-face time with patient.  This included previsit chart review including  review of recent hospitalization, lab review, study review, order entry, electronic health record documentation, patient education regarding recent stroke including etiology, secondary stroke prevention measures and importance of managing stroke risk factors, residual deficits and typical recovery time and answered all other questions to patient satisfaction   , [facility]  [facility] [address] [place], [facility]  Phone [phone] Fax [phone] Note: This document was prepared with digital dictation and possible smart phrase technology. Any transcriptional errors that result from this process are unintentional.

## 2021-12-17 ENCOUNTER — Ambulatory Visit: Payer: BC Managed Care – PPO | Admitting: Family Medicine

## 2021-12-17 ENCOUNTER — Encounter: Payer: Self-pay | Admitting: Family Medicine

## 2021-12-17 VITALS — BP 127/65 | HR 75 | Ht 75.0 in | Wt 244.0 lb

## 2021-12-17 DIAGNOSIS — I6522 Occlusion and stenosis of left carotid artery: Secondary | ICD-10-CM | POA: Diagnosis not present

## 2021-12-17 DIAGNOSIS — I6330 Cerebral infarction due to thrombosis of unspecified cerebral artery: Secondary | ICD-10-CM | POA: Diagnosis not present

## 2021-12-17 DIAGNOSIS — E1169 Type 2 diabetes mellitus with other specified complication: Secondary | ICD-10-CM | POA: Diagnosis not present

## 2021-12-17 DIAGNOSIS — E785 Hyperlipidemia, unspecified: Secondary | ICD-10-CM

## 2021-12-17 DIAGNOSIS — Z720 Tobacco use: Secondary | ICD-10-CM

## 2021-12-17 DIAGNOSIS — I10 Essential (primary) hypertension: Secondary | ICD-10-CM

## 2022-01-02 ENCOUNTER — Other Ambulatory Visit: Payer: Self-pay | Admitting: Internal Medicine

## 2022-01-03 DIAGNOSIS — Z89511 Acquired absence of right leg below knee: Secondary | ICD-10-CM | POA: Diagnosis not present

## 2022-01-14 IMAGING — MR MR ANKLE*R* WO/W CM
7 of 10 series · 26 of 40 positions shown · IV contrast (10 ML GAD)
Comparison: 08/08/2019

CLINICAL DATA: History of osteomyelitis and abscess of the right
foot requiring debridement.

EXAM:
MRI OF THE RIGHT ANKLE WITHOUT AND WITH CONTRAST
TECHNIQUE: Multiplanar, multisequence MR imaging of the ankle was performed
before and after the administration of intravenous contrast.
CONTRAST:  10mL GADAVIST GADOBUTROL 1 MMOL/ML IV SOLN

[Series 5: PD fat-sat · axial · 4.0mm · 0.66mm/px · z∈[-67,+98]mm · 4 of 34 slices shown]
[im 1/34]
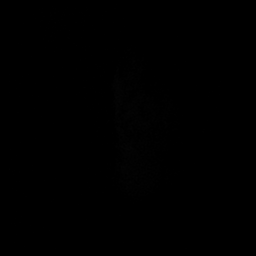
[im 12/34]
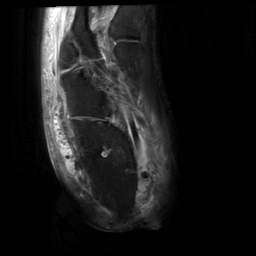
[im 23/34]
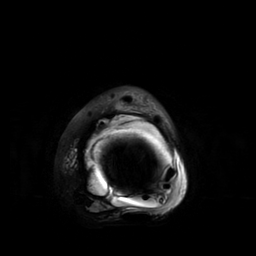
[im 34/34]
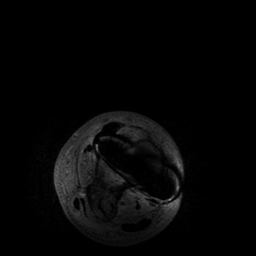

[Series 6: T2 fat-sat · axial · 4.0mm · 0.66mm/px · z∈[-67,+98]mm · 4 of 34 slices shown (1 of 2)]
[im 1/34]
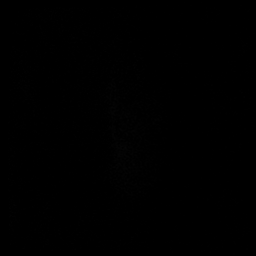
[im 12/34]
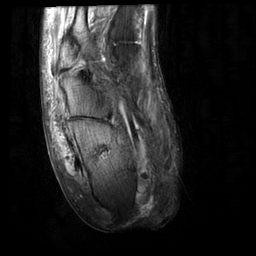
[im 23/34]
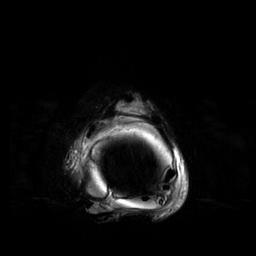
[im 34/34]
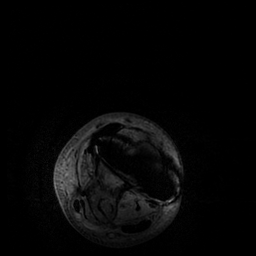

[Series 7: T2 fat-sat · coronal · 4.0mm · 0.74mm/px · 4 of 37 slices shown (2 of 2)]
[im 1/37]
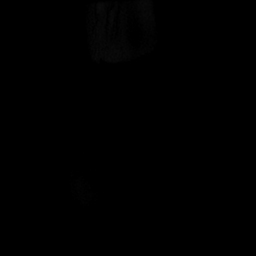
[im 13/37]
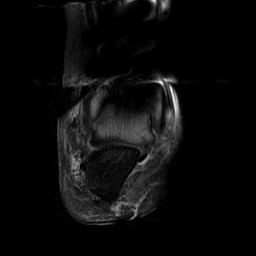
[im 25/37]
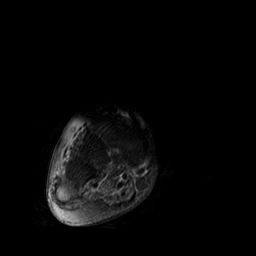
[im 37/37]
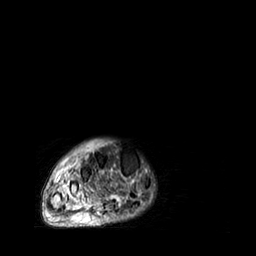

[Series 8: T1 · sagittal · 3.0mm · 0.39mm/px · 4 of 31 slices shown (1 of 2)]
[im 1/31]
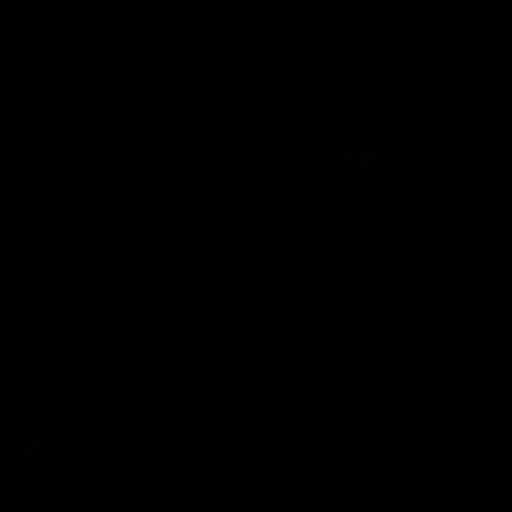
[im 11/31]
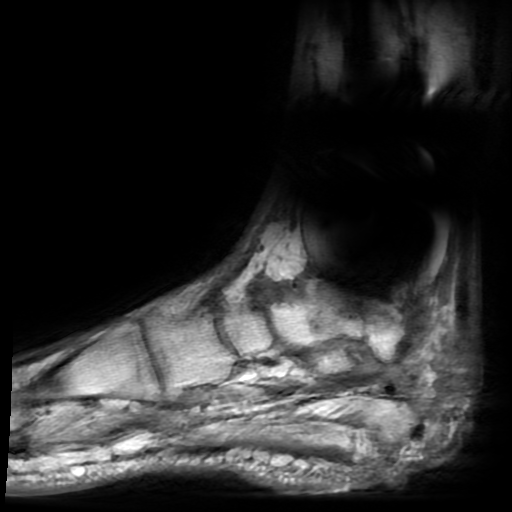
[im 21/31]
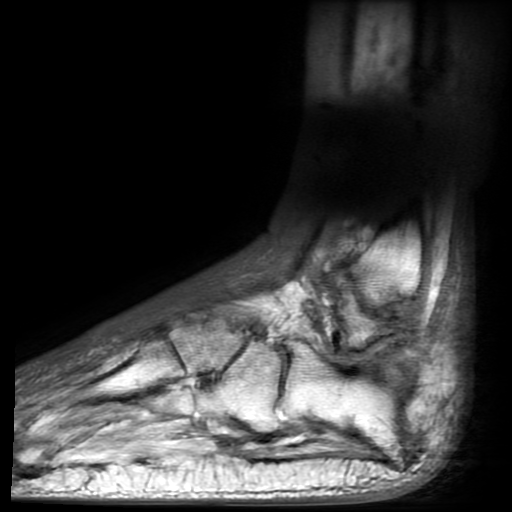
[im 31/31]
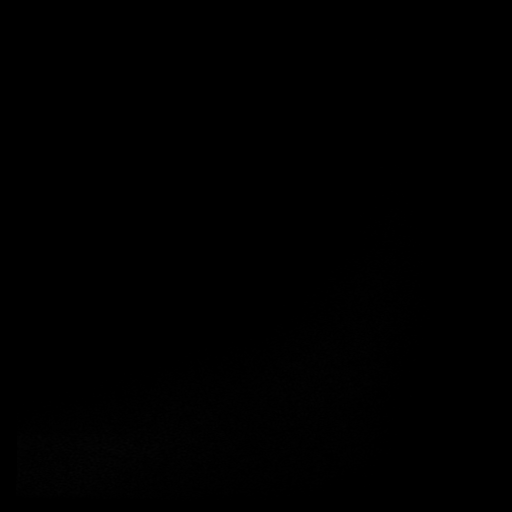

[Series 10: T1 · axial · non-contrast · 4.0mm · 0.33mm/px · z∈[-67,+98]mm · 4 of 34 slices shown (2 of 2)]
[im 1/34]
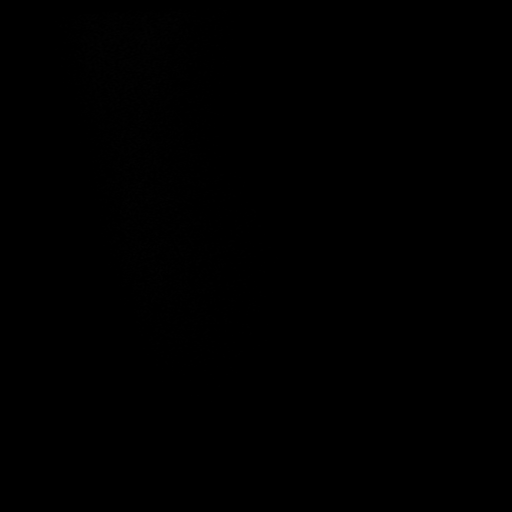
[im 12/34]
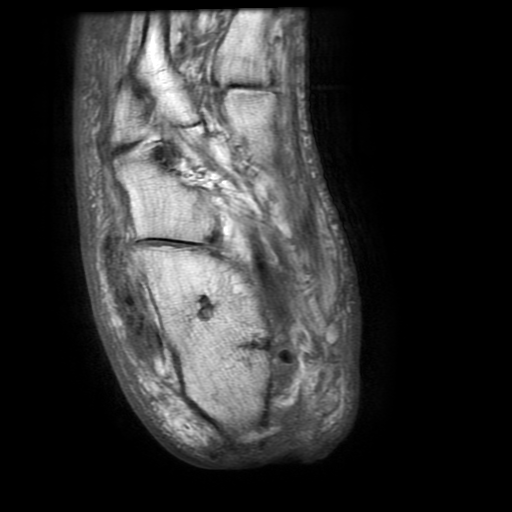
[im 23/34]
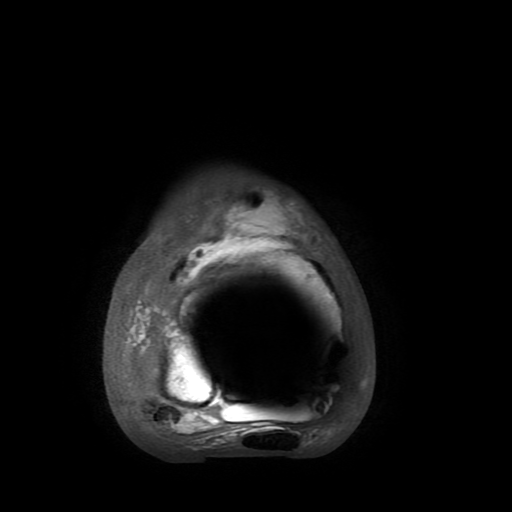
[im 34/34]
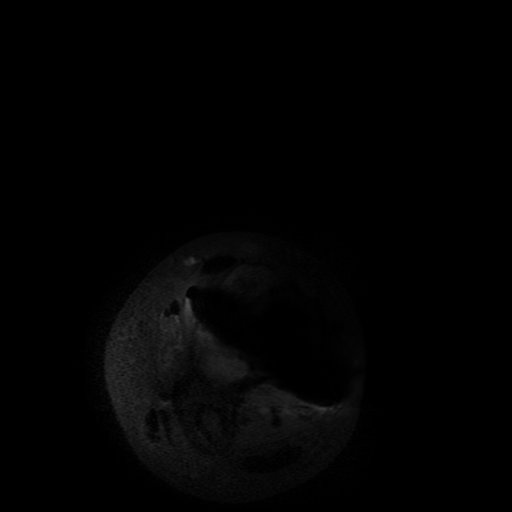

[Series 11: T1 fat-sat · axial · non-contrast · 4.0mm · 0.33mm/px · z∈[-67,+98]mm · 4 of 34 slices shown]
[im 1/34]
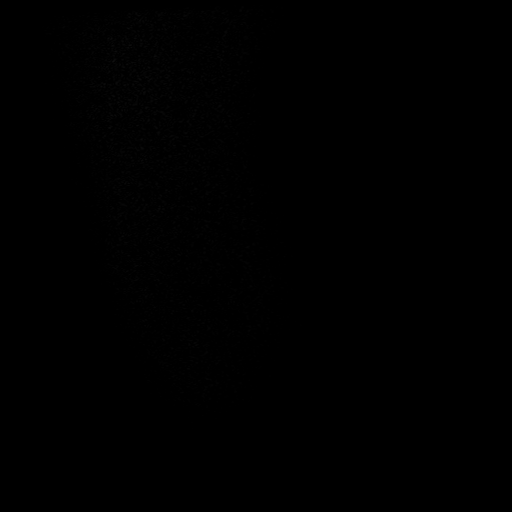
[im 12/34]
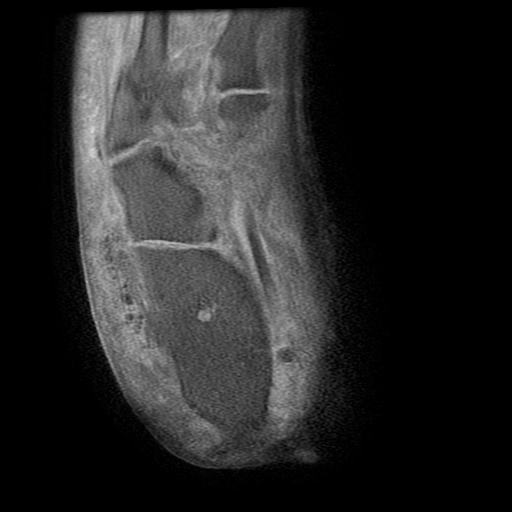
[im 23/34]
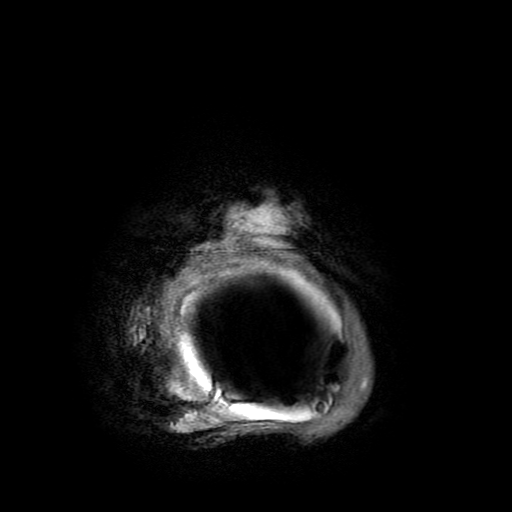
[im 34/34]
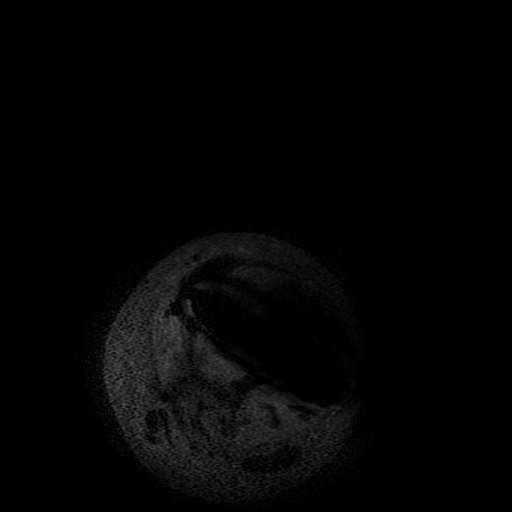

[Series 12: T1 fat-sat post-contrast · axial · 4.0mm · 0.33mm/px · z∈[-67,-12]mm · 2 of 34 slices shown]
[im 1/34]
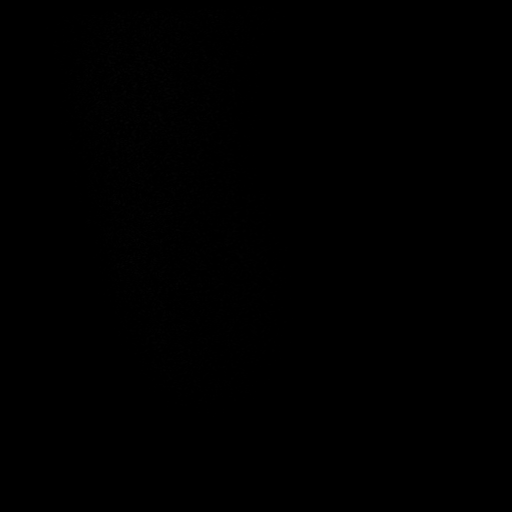
[im 12/34]
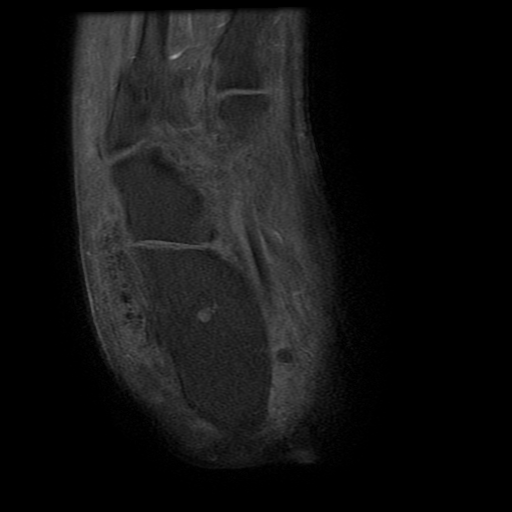

[26 of 40 positions shown; findings below may reference images not displayed]

FINDINGS: Susceptibility artifact resulting from the orthopedic hardware in
the distal tibia partially obscures the adjacent soft tissue and
osseous structures especially at the level of the ankle joint.

TENDONS

Peroneal: Peroneal longus tendon intact. Peroneal brevis intact.

Posteromedial: Posterior tibial tendon intact. Flexor hallucis
longus tendon intact. Flexor digitorum longus tendon intact.

Anterior: Tibialis anterior tendon intact. Extensor hallucis longus
tendon intact Extensor digitorum longus tendon intact.

Achilles: Interval resection of the calcaneal attachment of the
Achilles tendon.

Plantar Fascia: Interval resection of the calcaneal attachment of
the plantar fascia.

LIGAMENTS

Lateral: Obscured by susceptibility artifact resulting from the
distal tibial intramedullary nail.

Medial: Obscured by susceptibility artifact resulting from the
distal tibial intramedullary nail.

CARTILAGE

Ankle Joint: Susceptibility artifact resulting from the orthopedic
hardware in the distal tibia partially obscures the adjacent soft
tissue and osseous structures especially at the level of the ankle
joint.

Subtalar Joints/Sinus Tarsi: Normal subtalar joints. No subtalar
joint effusion. Normal sinus tarsi.

Bones/Soft Tissue:

Soft tissue ulcer or wound along the posteromedial aspect of the
hindfoot at the level of the posterior calcaneus. Interval
debridement of the hindfoot soft tissues. Interval posterior
calcaneal resection with release of the calcaneal attachment of the
plantar fascia and Achilles tendon. No significant marrow signal
abnormality. No periosteal reaction or bone destruction.

Osteoarthritis of the talonavicular joint.

Small amount of fluid along the flexor digitorum tendons at the
level of the midfoot which may reflect a ganglion cyst or
tenosynovial fluid. No surrounding inflammatory changes or
enhancement to suggest an abscess.
IMPRESSION: IMPRESSION
Soft tissue ulcer or wound along the posteromedial aspect of the
hindfoot at the level of the posterior calcaneus with interval
debridement of the hindfoot soft tissues. Interval posterior
calcaneal resection with release of the calcaneal attachment of the
plantar fascia and Achilles tendon. No evidence of osteomyelitis or
abscess.

## 2022-01-16 IMAGING — DX DG TIBIA/FIBULA PORT 2V*R*
4 series · 4 of 4 positions shown · non-contrast
Comparison: 07/16/2019, 08/21/2016

CLINICAL DATA: RIGHT foot gangrenous infection. Amputation of the
RIGHT foot 2 days ago.

EXAM:
PORTABLE RIGHT TIBIA AND FIBULA - 2 VIEW

[tibia ap (1 of 2)]
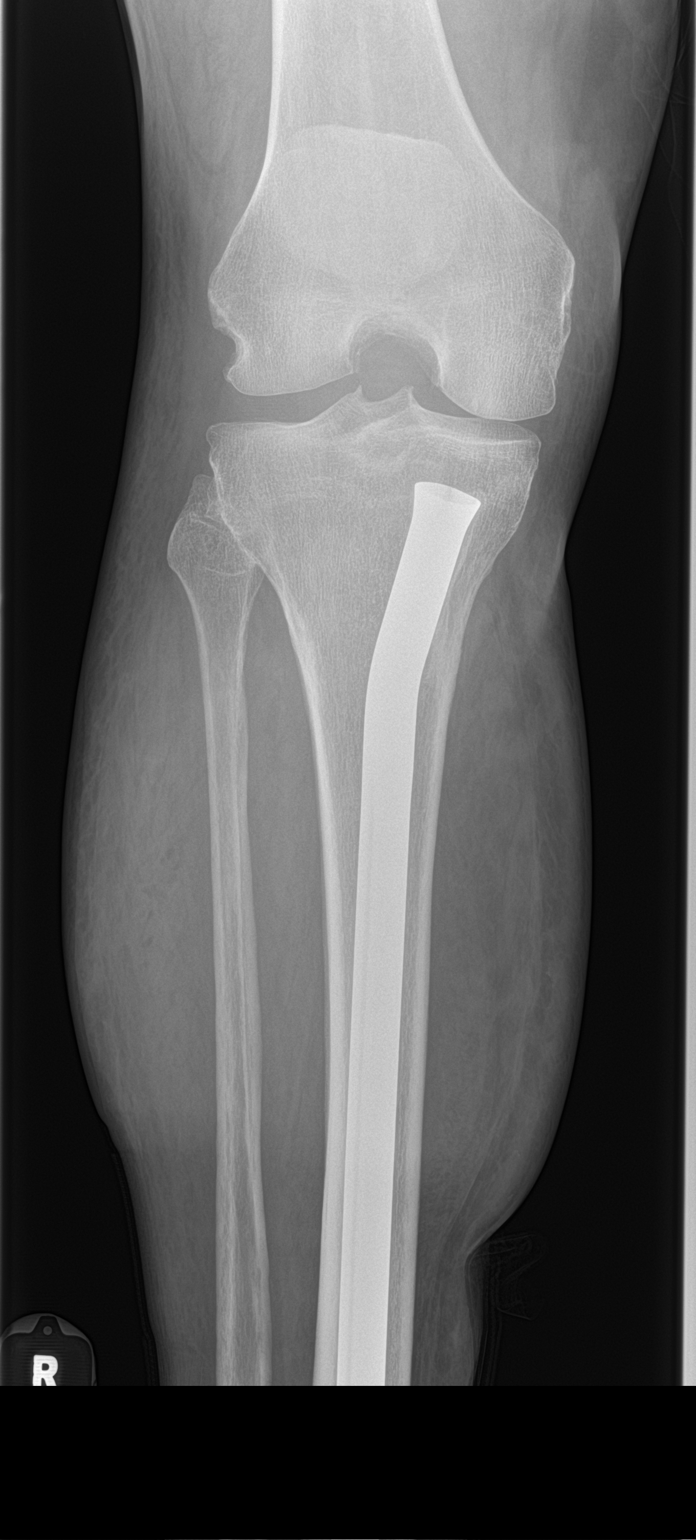

[tibia ap (2 of 2)]
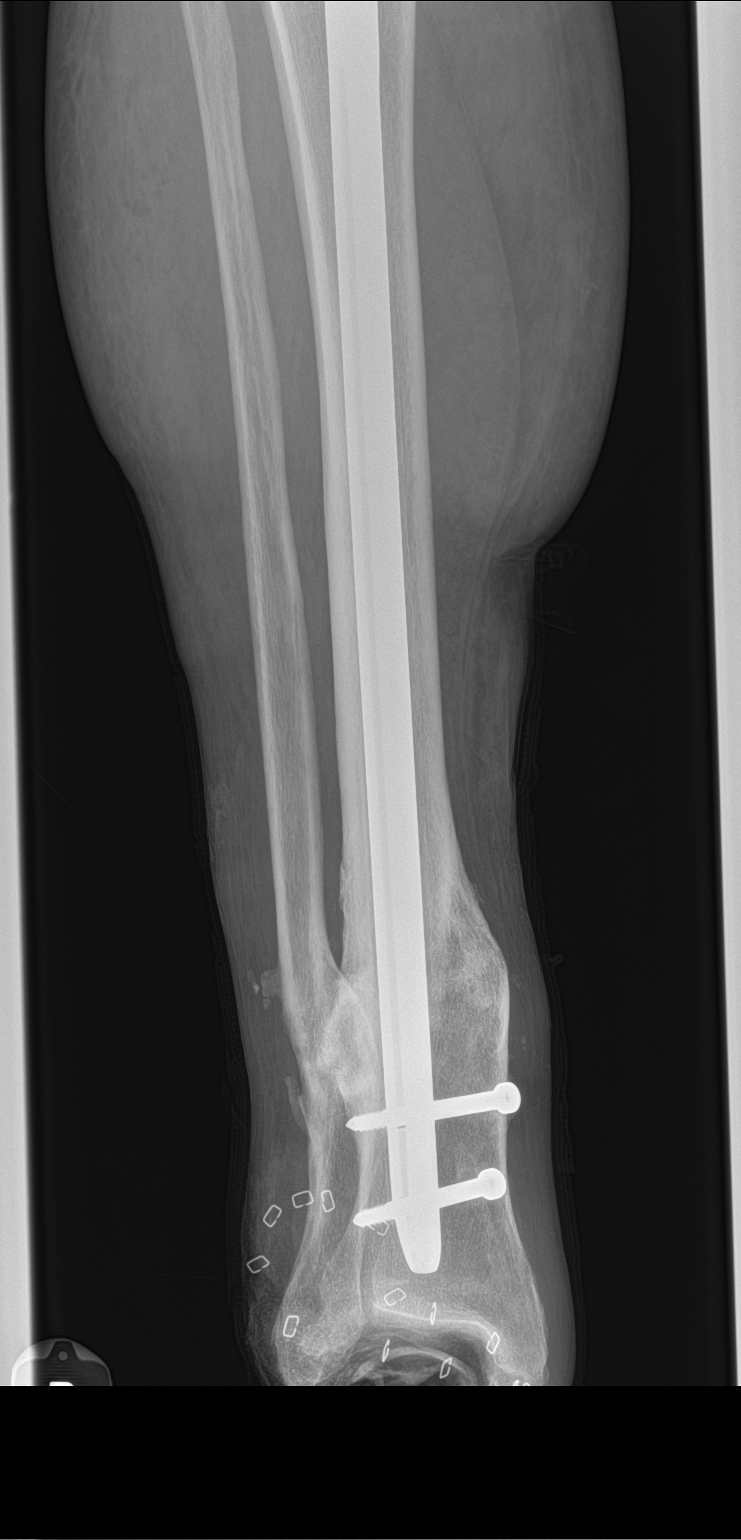

[tibia lat (1 of 2)]
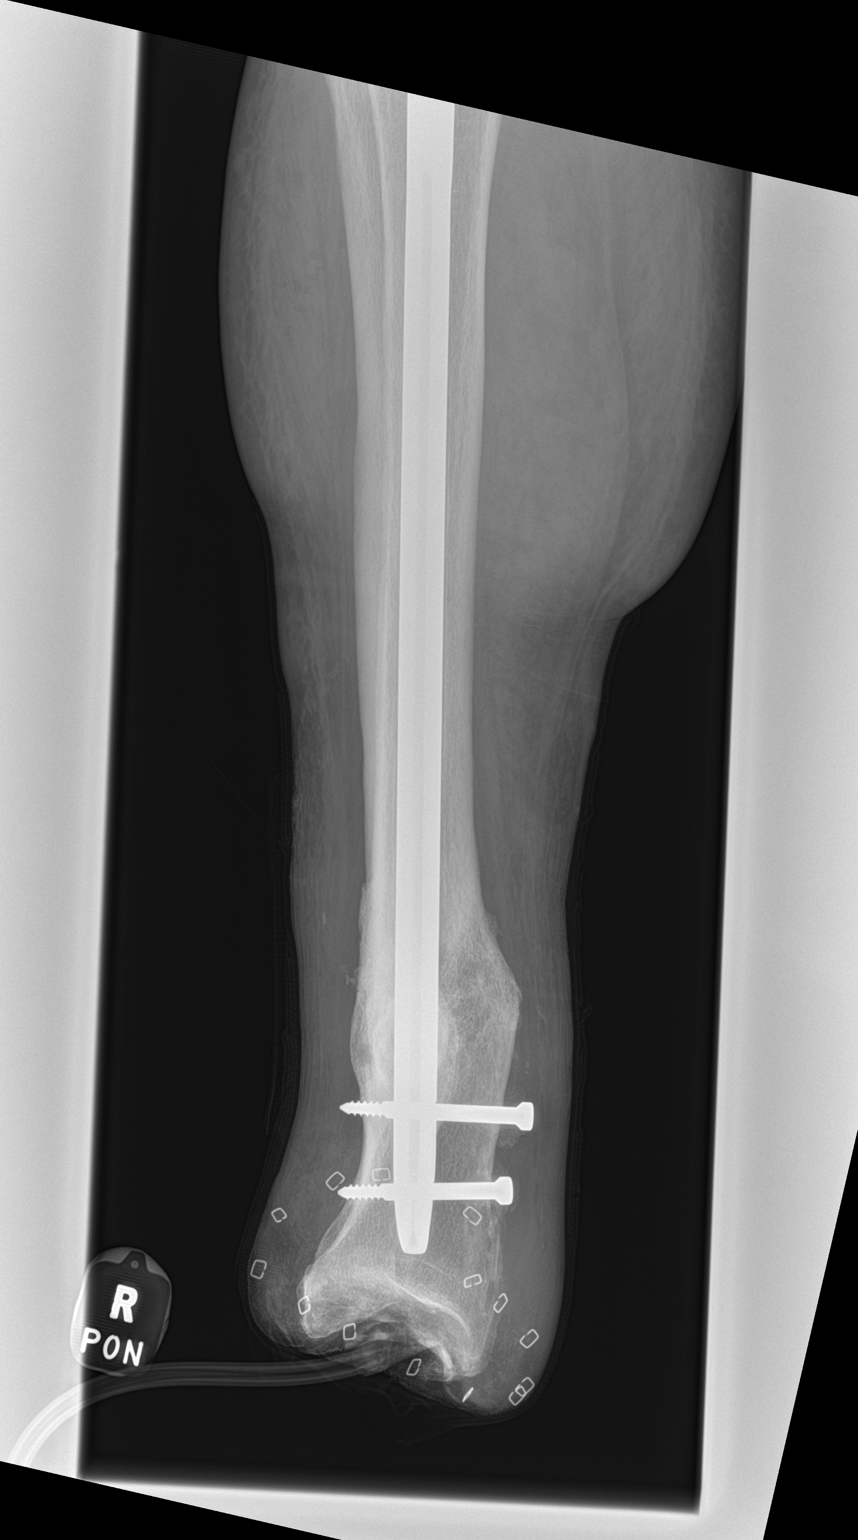

[tibia lat (2 of 2)]
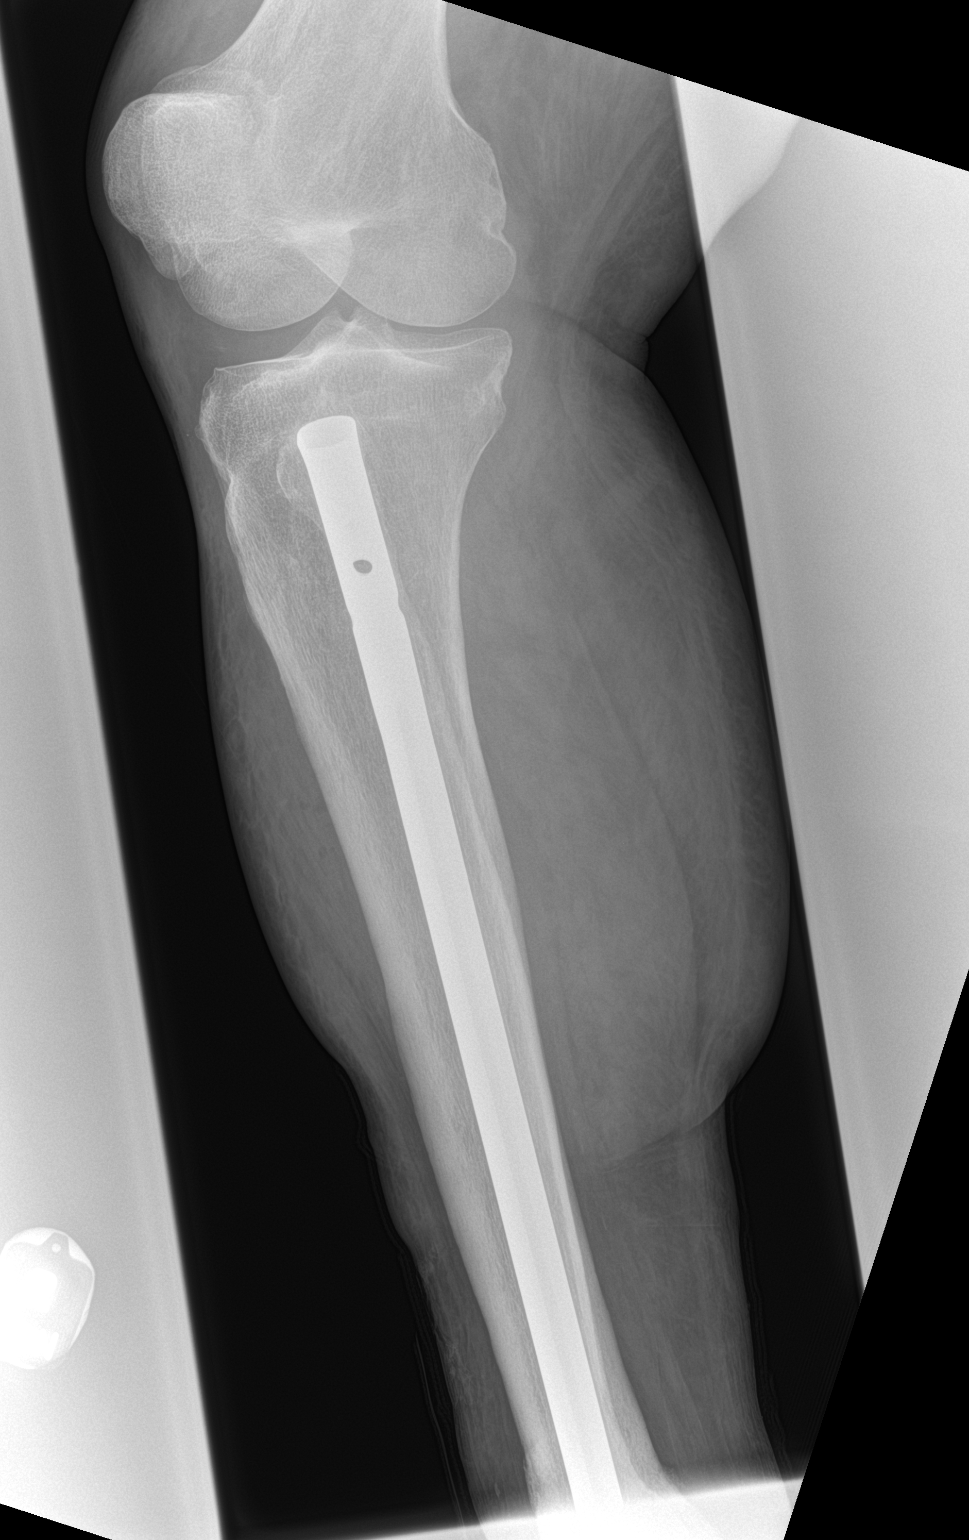

[4 of 4 positions shown; findings below may reference images not displayed]

FINDINGS: Intramedullary rod in the tibia with distal cortical screws,
unremarkable in appearance. Chronic deformity of the distal RIGHT
tibia and fibula consistent with remote injury. Interval amputation
of the RIGHT foot at the tibiotalar joint. The cortical surfaces of
the distal tibia and fibula are unremarkable. Postoperative skin
clips remain.
IMPRESSION: 1. Postoperative changes.
2. No evidence for acute abnormality.

## 2022-01-28 DIAGNOSIS — Z0271 Encounter for disability determination: Secondary | ICD-10-CM

## 2022-02-02 ENCOUNTER — Other Ambulatory Visit: Payer: Self-pay | Admitting: Internal Medicine

## 2022-02-02 DIAGNOSIS — E1122 Type 2 diabetes mellitus with diabetic chronic kidney disease: Secondary | ICD-10-CM

## 2022-02-02 DIAGNOSIS — E1169 Type 2 diabetes mellitus with other specified complication: Secondary | ICD-10-CM

## 2022-02-02 MED ORDER — METFORMIN HCL ER 500 MG PO TB24: ORAL_TABLET | ORAL | 3 refills | Status: DC

## 2022-02-05 ENCOUNTER — Other Ambulatory Visit: Payer: Self-pay

## 2022-02-05 ENCOUNTER — Telehealth: Payer: Self-pay | Admitting: Nurse Practitioner

## 2022-02-05 MED ORDER — CITALOPRAM HYDROBROMIDE 40 MG PO TABS: ORAL_TABLET | ORAL | 0 refills | Status: DC

## 2022-02-05 NOTE — Telephone Encounter (Signed)
Requesting refill on Citalopram to go to [facility] in [place]

## 2022-02-21 ENCOUNTER — Ambulatory Visit: Payer: 59 | Admitting: Nurse Practitioner

## 2022-02-25 NOTE — Progress Notes (Signed)
FOLLOW UP 3 MONTH  Assessment and Plan:   [name] was seen today for follow-up.  Diagnoses and all orders for this visit:  Essential Hypertension Continue lisinopril and verapamil and HCTZ 25 mg 1/2 tab daily Monitor blood pressure at home; patient to call if consistently greater than 130/80 Continue DASH diet.   Reminder to go to the ER if any CP, SOB, nausea, dizziness, severe HA, changes vision/speech, left arm numbness and tingling and jaw pain. -     CBC with Differential/Platelet -     COMPLETE METABOLIC PANEL WITH GFR  Hyperlipidemia associated with type 2 diabetes mellitus ([name]) Taking rosuvastatin 40mg  daily Currently goal of LDL <70;  Continue low cholesterol diet and exercise.  -     Lipid panel  Type 2 diabetes mellitus with stage 2 chronic kidney disease, without long-term current use of insulin ([name]) Discussed dietary and exercise modifications Continue Glucophage 500 mg 2 tab BID a Will d/c glipizide Would like to try Trulicity as blood sugars are still not controlled.  Keep blood sugar log daily and bring to next visit -     Hemoglobin A1c  Vitamin D deficiency Continue supplementation to maintain goal of 60-100  Class 2 severe obesity  associated with comorbidity([name]) Long discussion about weight loss, diet, and exercise Recommended diet heavy in fruits and veggies and low in animal meats, cheeses, and dairy products, appropriate calorie intake Has lost 21 pounds in past 4 months Follow up at next visit    Tobacco use Discussed risks associated with tobacco use and advised to reduce or quit He is not currently ready Will follow up at the next visit  COPD([name]) Advised to stop smoking - risks discussed. Declines medications.  Currently stable without respiratory medications  R BTK amputation ([name]) Stump healed well; completed PT New sleeve fitted yesterday       Continue diet and meds as discussed. Further disposition pending results of labs.  Discussed med's effects and SE's.   Over 30 minutes of face to face interview, exam, counseling, chart review, and critical decision making was performed.   Future Appointments  Date Time Provider Department Center  02/26/2022  3:30 PM 04/27/2022, NP [facility] None  07/24/2022  3:00 PM 09/24/2022, MD [facility] None    ----------------------------------------------------------------------------------------------------------------------  HPI 62 y.o. male  presents for 3 month follow up on hypertension, cholesterol, diabetes, weight and vitamin D deficiency.  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 He did have a sore on bottom of stump about 4 months ago and did get a new sleeve yesterday.   He works as a 11/01/2019, sits at desk most of day. M-F 7:30-5  He currently continues to smoke 1 pack a day x 45 years; Started smoking at age [age] discussed risks associated with smoking, patient is not ready to quit.    BMI is There is no height or weight on file to calculate BMI., he has been working on diet and exercise, working with PT, watching portions, his appetite is much smaller from the Trulicity- he has lost 21 pounds in the past 4 months.  Wt Readings from Last 3 Encounters:  12/17/21 244 lb (110.7 kg)  11/21/21 243 lb 3.2 oz (110.3 kg)  08/20/21 259 lb (117.5 kg)   His blood pressure has been controlled at home with Lisinopril 20 mg QD, HCTZ 25  mg QD, and verapamil 240 mg QD, today their BP is    BP Readings from Last 3 Encounters:  12/17/21 127/65  11/21/21 108/60  08/20/21 140/70     He does workout. He denies chest pain, shortness of breath, dizziness.   He is on cholesterol medication rosuvastatin 40mg  daily and denies myalgias. His cholesterol is at goal. The cholesterol last visit was:   Lab Results  Component Value Date    CHOL 102 11/21/2021   HDL 25 (L) 11/21/2021   LDLCALC 50 11/21/2021   TRIG 207 (H) 11/21/2021   CHOLHDL 4.1 11/21/2021    He has been working on diet and exercise for T2 diabetes  Neuropathy in bil feet, some numbness and weakness in hands and feet, was on gabapentin but recently improved Hyperlipidemia- Rosuvastatin 40 mg Metformin 500mg  one in morning and two in evening. Trulicity 4.5 mg SQ QW denies foot ulcerations, increased appetite, nausea, polydipsia, polyuria, visual disturbances, vomiting and weight loss.  He checks sugars occasionally, low 200's Last A1C in the office was:  Lab Results  Component Value Date   HGBA1C 5.6 11/21/2021    He has been drinking an IV hydration drink and drinks water throughout the day. Gets up 0-1 time a night to urinate. Lab Results  Component Value Date   EGFR 95 11/21/2021    Patient is on Vitamin D supplement and at goal at recent check:    Lab Results  Component Value Date   VD25OH 74 07/19/2021         Current Medications:  Current Outpatient Medications on File Prior to Visit  Medication Sig   cetirizine (ZYRTEC) 10 MG tablet Take 10 mg by mouth daily as needed for allergies.   Cholecalciferol (VITAMIN D) 50 MCG (2000 UT) CAPS Take 2,000 Units by mouth 2 (two) times daily.   citalopram (CELEXA) 40 MG tablet TAKE 1 TABLET BY MOUTH ONCE DAILY FOR  MOOD  AND  CHRONIC  ANXIETY   clopidogrel (PLAVIX) 75 MG tablet Take 1 tablet (75 mg total) by mouth daily.   Dulaglutide (TRULICITY) 4.5 PP/2.9JJ SOPN Inject 4.5 mg as directed once a week. for Diabetes  (Dx: e11.29 )   hydrochlorothiazide (HYDRODIURIL) 25 MG tablet Take  1 tablet  Daily  for BP & Fluid Retention / Ankle Swelling (Patient taking differently: Take 12.5 mg by mouth daily. for BP & Fluid Retention / Ankle Swelling)   lisinopril (ZESTRIL) 20 MG tablet Take  1 tablet  Daily for BP & Diabetic Kidney Protection                                /                                      TAKE                                 BY MOUTH    BY MOUTH                    ONCE DAILY   metFORMIN (GLUCOPHAGE-XR) 500 MG 24 hr tablet Take 2 tablets 2 x /day with Meals for Diabetes.   pantoprazole (PROTONIX) 40 MG tablet Take 1 tablet by mouth once daily   rosuvastatin (  CRESTOR) 40 MG tablet Take one tablet daily for Cholesterol. (Patient taking differently: Take 40 mg by mouth daily. Take one tablet daily for Cholesterol.)   Study - OCEANIC-STROKE - asundexian 50 mg or placebo tablet (PI-Sethi) Take 1 tablet (50 mg total) by mouth daily. For investigational use only. Take 1 tablet by mouth once daily at the same time each day, preferably in the morning. Please bring bottle to every visit.   verapamil (CALAN-SR) 240 MG CR tablet Take 1 tablet Daily for BP & Heart Rhythm                                /                         TAKE                              BY                       MOUTH                  ONCE DAILY   No current facility-administered medications on file prior to visit.     Allergies:  Allergies  Allergen Reactions   Invokana [Canagliflozin] Other (See Comments)    Extremity edema/caused pain   Codeine Camsylate [Codeine] Rash   Morphine And Related Rash     Medical History:  Past Medical History:  Diagnosis Date   Diabetic neuropathy ([place])    Diverticulitis    History of hepatitis C    has been treated in the past   History of kidney stones    Hyperlipidemia    Hypertension    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[place])    Other testicular hypofunction    Severe sepsis ([place]) 08/26/2019   Type II or unspecified type diabetes mellitus without mention of complication, not stated as uncontrolled    Vitamin D deficiency    Family history- Reviewed and unchanged Social history- Reviewed and unchanged   Review of Systems:  Review of Systems  Constitutional:  Negative for malaise/fatigue and weight loss.  HENT:  Negative for hearing loss and tinnitus.   Eyes:  Negative  for blurred vision and double vision.  Respiratory:  Negative for cough, shortness of breath and wheezing.   Cardiovascular:  Negative for chest pain, palpitations, orthopnea, claudication and leg swelling.  Gastrointestinal:  Negative for abdominal pain, blood in stool, constipation, diarrhea, heartburn, melena, nausea and vomiting.  Genitourinary: Negative.   Musculoskeletal:  Negative for joint pain and myalgias.  Skin:  Negative for rash.       Callus of [name] stump  Neurological:  Negative for dizziness, tingling, sensory change, weakness and headaches.  Endo/Heme/Allergies:  Negative for polydipsia.  Psychiatric/Behavioral:  Negative for depression, hallucinations, memory loss and substance abuse. The patient is not nervous/anxious.   All other systems reviewed and are negative.    Physical Exam: There were no vitals taken for this visit. Wt Readings from Last 3 Encounters:  12/17/21 244 lb (110.7 kg)  11/21/21 243 lb 3.2 oz (110.3 kg)  08/20/21 259 lb (117.5 kg)   General Appearance: Well nourished, in no apparent distress. Eyes: PERRLA, EOMs, conjunctiva no swelling or erythema Sinuses: No Frontal/maxillary tenderness ENT/Mouth: Ext aud canals clear, TMs without erythema, bulging.  No erythema, swelling, or exudate on post pharynx.  Tonsils not swollen or erythematous. Hearing normal.  Neck: Supple, thyroid normal. No carotid bruits heard Respiratory: Respiratory effort normal, BS equal bilaterally with diffuse wheezing without rales, rhonchi,  or stridor.  Cardio: RRR with no MRGs. Brisk peripheral pulses without edema.  Abdomen: Soft, + BS.  Non tender, no guarding, rebound, hernias, masses. Lymphatics: Non tender without lymphadenopathy.  Musculoskeletal:; R BTK amputation w/ prosthesis. Otherwise no deformity. Symmetrical upper extremity strength.  Skin: Warm, dry without. 1-2 cm callus on right stump, no signs of infection Neuro: Cranial nerves intact. No cerebellar  symptoms.  Psych: Awake and oriented X 3, normal affect, Insight and Judgment appropriate.    Michael Rossetti, NP 12:30 PM [facility]

## 2022-02-26 ENCOUNTER — Ambulatory Visit (INDEPENDENT_AMBULATORY_CARE_PROVIDER_SITE_OTHER): Payer: 59 | Admitting: Nurse Practitioner

## 2022-02-26 ENCOUNTER — Encounter: Payer: Self-pay | Admitting: Nurse Practitioner

## 2022-02-26 VITALS — BP 120/60 | HR 71 | Temp 97.9°F | Resp 16 | Ht 75.0 in | Wt 241.0 lb

## 2022-02-26 DIAGNOSIS — N182 Chronic kidney disease, stage 2 (mild): Secondary | ICD-10-CM | POA: Diagnosis not present

## 2022-02-26 DIAGNOSIS — I10 Essential (primary) hypertension: Secondary | ICD-10-CM | POA: Diagnosis not present

## 2022-02-26 DIAGNOSIS — Z89511 Acquired absence of right leg below knee: Secondary | ICD-10-CM

## 2022-02-26 DIAGNOSIS — E785 Hyperlipidemia, unspecified: Secondary | ICD-10-CM

## 2022-02-26 DIAGNOSIS — E1122 Type 2 diabetes mellitus with diabetic chronic kidney disease: Secondary | ICD-10-CM

## 2022-02-26 DIAGNOSIS — Z79899 Other long term (current) drug therapy: Secondary | ICD-10-CM | POA: Diagnosis not present

## 2022-02-26 DIAGNOSIS — F419 Anxiety disorder, unspecified: Secondary | ICD-10-CM

## 2022-02-26 DIAGNOSIS — E1169 Type 2 diabetes mellitus with other specified complication: Secondary | ICD-10-CM | POA: Diagnosis not present

## 2022-02-26 DIAGNOSIS — F17200 Nicotine dependence, unspecified, uncomplicated: Secondary | ICD-10-CM

## 2022-02-26 DIAGNOSIS — K219 Gastro-esophageal reflux disease without esophagitis: Secondary | ICD-10-CM | POA: Diagnosis not present

## 2022-02-26 DIAGNOSIS — E559 Vitamin D deficiency, unspecified: Secondary | ICD-10-CM | POA: Diagnosis not present

## 2022-02-26 DIAGNOSIS — J449 Chronic obstructive pulmonary disease, unspecified: Secondary | ICD-10-CM

## 2022-02-26 MED ORDER — ROSUVASTATIN CALCIUM 40 MG PO TABS: ORAL_TABLET | ORAL | 3 refills | Status: DC

## 2022-02-26 MED ORDER — CITALOPRAM HYDROBROMIDE 40 MG PO TABS: ORAL_TABLET | ORAL | 0 refills | Status: DC

## 2022-02-26 MED ORDER — TRULICITY 4.5 MG/0.5ML ~~LOC~~ SOAJ: 4.5000 mg | SUBCUTANEOUS | 3 refills | Status: DC

## 2022-02-26 MED ORDER — HYDROCHLOROTHIAZIDE 25 MG PO TABS: ORAL_TABLET | ORAL | 3 refills | Status: DC

## 2022-02-26 MED ORDER — PANTOPRAZOLE SODIUM 40 MG PO TBEC: 40.0000 mg | DELAYED_RELEASE_TABLET | Freq: Every day | ORAL | 5 refills | Status: DC

## 2022-02-26 NOTE — Patient Instructions (Signed)

## 2022-02-27 LAB — LIPID PANEL
Cholesterol: 92 mg/dL (ref <200)
HDL: 28 mg/dL — ABNORMAL LOW (ref >=40)
LDL Cholesterol (Calc): 44 mg/dL (calc)
Non-HDL Cholesterol (Calc): 64 mg/dL (calc) (ref <130)
Total CHOL/HDL Ratio: 3.3 (calc) (ref <5.0)
Triglycerides: 114 mg/dL (ref <150)

## 2022-02-27 LAB — HEMOGLOBIN A1C
Hgb A1c MFr Bld: 5.5 % of total Hgb (ref <5.7)
Mean Plasma Glucose: 111 mg/dL
eAG (mmol/L): 6.2 mmol/L

## 2022-02-27 LAB — CBC WITH DIFFERENTIAL/PLATELET
Absolute Monocytes: 910 cells/uL (ref 200–950)
Basophils Absolute: 107 cells/uL (ref 0–200)
Basophils Relative: 1 %
Eosinophils Absolute: 482 cells/uL (ref 15–500)
Eosinophils Relative: 4.5 %
HCT: 40.5 % (ref 38.5–50.0)
Hemoglobin: 14.1 g/dL (ref 13.2–17.1)
Lymphs Abs: 3510 cells/uL (ref 850–3900)
MCH: 33 pg (ref 27.0–33.0)
MCHC: 34.8 g/dL (ref 32.0–36.0)
MCV: 94.8 fL (ref 80.0–100.0)
MPV: 12 fL (ref 7.5–12.5)
Monocytes Relative: 8.5 %
Neutro Abs: 5692 cells/uL (ref 1500–7800)
Neutrophils Relative %: 53.2 %
Platelets: 197 10*3/uL (ref 140–400)
RBC: 4.27 10*6/uL (ref 4.20–5.80)
RDW: 12.2 % (ref 11.0–15.0)
Total Lymphocyte: 32.8 %
WBC: 10.7 10*3/uL (ref 3.8–10.8)

## 2022-02-27 LAB — COMPLETE METABOLIC PANEL WITH GFR
AG Ratio: 1.5 (calc) (ref 1.0–2.5)
ALT: 28 U/L (ref 9–46)
AST: 24 U/L (ref 10–35)
Albumin: 4 g/dL (ref 3.6–5.1)
Alkaline phosphatase (APISO): 57 U/L (ref 35–144)
BUN: 21 mg/dL (ref 7–25)
CO2: 27 mmol/L (ref 20–32)
Calcium: 9.4 mg/dL (ref 8.6–10.3)
Chloride: 103 mmol/L (ref 98–110)
Creat: 0.85 mg/dL (ref 0.70–1.35)
Globulin: 2.6 g/dL (calc) (ref 1.9–3.7)
Glucose, Bld: 106 mg/dL — ABNORMAL HIGH (ref 65–99)
Potassium: 5 mmol/L (ref 3.5–5.3)
Sodium: 138 mmol/L (ref 135–146)
Total Bilirubin: 0.4 mg/dL (ref 0.2–1.2)
Total Protein: 6.6 g/dL (ref 6.1–8.1)
eGFR: 98 mL/min/{1.73_m2} (ref >=60)

## 2022-03-01 ENCOUNTER — Other Ambulatory Visit: Payer: Self-pay | Admitting: Nurse Practitioner

## 2022-03-01 DIAGNOSIS — F419 Anxiety disorder, unspecified: Secondary | ICD-10-CM

## 2022-04-29 ENCOUNTER — Other Ambulatory Visit: Payer: Self-pay | Admitting: Internal Medicine

## 2022-04-29 DIAGNOSIS — I493 Ventricular premature depolarization: Secondary | ICD-10-CM

## 2022-05-08 ENCOUNTER — Other Ambulatory Visit: Payer: Self-pay | Admitting: Internal Medicine

## 2022-05-08 DIAGNOSIS — I6330 Cerebral infarction due to thrombosis of unspecified cerebral artery: Secondary | ICD-10-CM

## 2022-05-08 MED ORDER — CLOPIDOGREL BISULFATE 75 MG PO TABS: ORAL_TABLET | ORAL | 3 refills | Status: DC

## 2022-05-28 NOTE — Progress Notes (Unsigned)
FOLLOW UP 3 MONTH  Assessment and Plan:   [name] was seen today for follow-up.  Diagnoses and all orders for this visit:  Essential Hypertension Continue verapamil and HCTZ 25 mg 1 tab daily Decrease Lisinopril to 10 mg 1 tab PO QD Monitor blood pressure at home; patient to call if consistently greater than 130/80 Continue DASH diet.   Reminder to go to the ER if any CP, SOB, nausea, dizziness, severe HA, changes vision/speech, Michael arm numbness and tingling and jaw pain. -     CBC with Differential/Platelet -     COMPLETE METABOLIC PANEL WITH GFR  Hyperlipidemia associated with type 2 diabetes mellitus ([name]) Taking rosuvastatin 40mg  daily Currently goal of LDL <70;  Continue low cholesterol diet and exercise.  -     Lipid panel  Type 2 diabetes mellitus with stage 2 chronic kidney disease, without long-term current use of insulin ([name]) Discussed dietary and exercise modifications Continue Glucophage 500 mg 2 tab BID a Trulicity 3.0 mg SQ QW sent in since unable to get 4.5 mg due to backorder issue Keep blood sugar log daily and bring to next visit -     Hemoglobin A1c  Vitamin D deficiency Continue supplementation to maintain goal of 60-100  Anxiety Continue Citalopram 40 mg QD Continue diet and exercise  Class 2 severe obesity  associated with comorbidity([name]) Long discussion about weight loss, diet, and exercise Recommended diet heavy in fruits and veggies and low in animal meats, cheeses, and dairy products, appropriate calorie intake Since Trulicity has been on backorder appetite has increased Follow up at next visit   GERD Continue Pantoprazole 40 mg QD and diet modifications  Tobacco use Discussed risks associated with tobacco use and advised to reduce or quit He is not currently ready Will follow up at the next visit  COPD([name]) Advised to stop smoking - risks discussed. Declines medications.  Currently stable without respiratory medications  Michael BTK  amputation ([name]) Stump healed well; completed PT New sleeve fitted yesterday   Skin ulcer of calf, Michael, limited to breakdown of skin ([name]) Keep area clean and dry Start Bactrim  Follow up in 1 week -     sulfamethoxazole-trimethoprim (BACTRIM DS) 800-160 MG tablet; Take 1 tablet by mouth 2 (two) times daily.   Medication management -     CBC with Differential/Platelet -     COMPLETE METABOLIC PANEL WITH GFR -     Lipid panel -     TSH -     Hemoglobin A1c         Continue diet and meds as discussed. Further disposition pending results of labs. Discussed med's effects and SE's.   Over 30 minutes of face to face interview, exam, counseling, chart review, and critical decision making was performed.   Future Appointments  Date Time Provider Department Center  09/04/2022  3:00 PM Lucky Cowboy, MD [facility] None    ----------------------------------------------------------------------------------------------------------------------  HPI [name]  presents for 3 month follow up on hypertension, cholesterol, diabetes, weight and vitamin D deficiency.  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 He continues to follow with orthotics for prosthesis  He has an ulcerated area on his [name] that is reddened and has a blackened center.  Has been present for about 3 weeks. He has neuropathy of his [name].   He currently continues to smoke 1  pack a day x 45 years; Started smoking at age [age] discussed risks associated with smoking, patient is not ready to quit.    BMI is Body mass index is 31.46 kg/m., he has been working on diet and exercise Wt Readings from Last 3 Encounters:  05/29/22 245 lb (111.1 kg)  02/26/22 241 lb (109.3 kg)  12/17/21 244 lb (110.7 kg)   His blood pressure has been controlled at home 118/70 with Lisinopril 20  mg QD, HCTZ 25 mg QD, and verapamil 240 mg QD, today their BP is BP: 104/60  BP Readings from Last 3 Encounters:  05/29/22 104/60  02/26/22 120/60  12/17/21 127/65   He does workout. He denies chest pain, shortness of breath, dizziness.   He is on cholesterol medication rosuvastatin 40mg  daily and denies myalgias. His cholesterol is at goal. The cholesterol last visit was:   Lab Results  Component Value Date   CHOL 92 02/26/2022   HDL 28 (L) 02/26/2022   LDLCALC 44 02/26/2022   TRIG 114 02/26/2022   CHOLHDL 3.3 02/26/2022    He has been working on diet and exercise for T2 diabetes  Neuropathy in bil feet, some numbness and weakness in hands and feet, was on gabapentin but recently improved Hyperlipidemia- Rosuvastatin 40 mg Metformin 500mg  one in morning and two in evening. Trulicity 4.5 mg SQ QW has not had for 6 weeks because on backorder denies Arellano ulcerations, increased appetite, nausea, polydipsia, polyuria, visual disturbances, vomiting and weight loss.  He checks sugars occasionally, running low 100's Last A1C in the office was:  Lab Results  Component Value Date   HGBA1C 5.5 02/26/2022    He has been drinking an IV hydration drink and drinks water throughout the day. Gets up 0-1 time a night to urinate. Lab Results  Component Value Date   EGFR 98 02/26/2022    Patient is on Vitamin D supplement and at goal at recent check:    Lab Results  Component Value Date   VD25OH 74 07/19/2021         Current Medications:  Current Outpatient Medications on File Prior to Visit  Medication Sig   cetirizine (ZYRTEC) 10 MG tablet Take 10 mg by mouth daily as needed for allergies.   Cholecalciferol (VITAMIN D) 50 MCG (2000 UT) CAPS Take 2,000 Units by mouth 2 (two) times daily.   citalopram (CELEXA) 40 MG tablet TAKE 1 TABLET BY MOUTH ONCE DAILY FOR MOOD AND CHRONIC ANXIETY   clopidogrel (PLAVIX) 75 MG tablet Take  1 tablet  Daily  to Prevent Blood Clots & Strokes    hydrochlorothiazide (HYDRODIURIL) 25 MG tablet Take  1 tablet  Daily  for BP & Fluid Retention / Ankle Swelling   lisinopril (ZESTRIL) 20 MG tablet Take  1 tablet  Daily for BP & Diabetic Kidney Protection                                /                                     TAKE                                 BY MOUTH    BY MOUTH  ONCE DAILY   metFORMIN (GLUCOPHAGE-XR) 500 MG 24 hr tablet Take 2 tablets 2 x /day with Meals for Diabetes.   pantoprazole (PROTONIX) 40 MG tablet Take 1 tablet (40 mg total) by mouth daily.   rosuvastatin (CRESTOR) 40 MG tablet Take one tablet daily for Cholesterol.   Study - OCEANIC-STROKE - asundexian 50 mg or placebo tablet (PI-Sethi) Take 1 tablet (50 mg total) by mouth daily. For investigational use only. Take 1 tablet by mouth once daily at the same time each day, preferably in the morning. Please bring bottle to every visit.   verapamil (CALAN-SR) 240 MG CR tablet TAKE 1 TABLET BY MOUTH EVERY DAY FOR BLOOD PRESSURE AND HEART RHYTHM   No current facility-administered medications on file prior to visit.     Allergies:  Allergies  Allergen Reactions   Invokana [Canagliflozin] Other (See Comments)    Extremity edema/caused pain   Codeine Camsylate [Codeine] Rash   Morphine And Related Rash     Medical History:  Past Medical History:  Diagnosis Date   Diabetic neuropathy ([name])    Diverticulitis    History of hepatitis C    has been treated in the past   History of kidney stones    Hyperlipidemia    Hypertension    Osteomyelitis of right Arellano ([name])    Other testicular hypofunction    Severe sepsis ([name]) 08/26/2019   Type II or unspecified type diabetes mellitus without mention of complication, not stated as uncontrolled    Vitamin D deficiency    Family history- Reviewed and unchanged Social history- Reviewed and unchanged   Review of Systems:  Review of Systems  Constitutional:  Negative for malaise/fatigue and weight loss.   HENT:  Negative for hearing loss and tinnitus.   Eyes:  Negative for blurred vision and double vision.  Respiratory:  Negative for cough, shortness of breath and wheezing.   Cardiovascular:  Negative for chest pain, palpitations, orthopnea, claudication and Arellano swelling.  Gastrointestinal:  Negative for abdominal pain, blood in stool, constipation, diarrhea, heartburn, melena, nausea and vomiting.  Genitourinary: Negative.   Musculoskeletal:  Negative for joint pain and myalgias.  Skin:  Negative for rash.       Skin breakdown of Michael calf  Neurological:  Negative for dizziness, tingling, sensory change, weakness and headaches.  Endo/Heme/Allergies:  Negative for polydipsia.  Psychiatric/Behavioral:  Negative for depression, hallucinations, memory loss and substance abuse. The patient is not nervous/anxious.   All other systems reviewed and are negative.    Physical Exam: BP 104/60   Pulse 66   Temp 97.7 F (36.5 C)   Ht 6\' 2"  (1.88 m)   Wt 245 lb (111.1 kg)   SpO2 96%   BMI 31.46 kg/m  Wt Readings from Last 3 Encounters:  05/29/22 245 lb (111.1 kg)  02/26/22 241 lb (109.3 kg)  12/17/21 244 lb (110.7 kg)   General Appearance: Well nourished, in no apparent distress. Eyes: PERRLA, EOMs, conjunctiva no swelling or erythema Sinuses: No Frontal/maxillary tenderness ENT/Mouth: Ext aud canals clear, TMs without erythema, bulging. No erythema, swelling, or exudate on post pharynx.  Tonsils not swollen or erythematous. Hearing normal.  Neck: Supple, thyroid normal. No carotid bruits heard Respiratory: Respiratory effort normal, BS equal bilaterally with diffuse wheezing without rales, rhonchi,  or stridor.  Cardio: RRR with no MRGs. Brisk peripheral pulses without edema.  Abdomen: Soft, + BS.  Non tender, no guarding, rebound, hernias, masses. Lymphatics: Non tender without lymphadenopathy.  Musculoskeletal:; Michael  BTK amputation w/ prosthesis. Otherwise no deformity. Symmetrical upper  extremity strength.  Skin: Warm, dry . 2 cm reddened area on Michael calf with blackened area in center. Neuro: Cranial nerves intact. No cerebellar symptoms.  Psych: Awake and oriented X 3, normal affect, Insight and Judgment appropriate.    Raynelle Dick, NP 4:03 PM [facility]

## 2022-05-29 ENCOUNTER — Ambulatory Visit (INDEPENDENT_AMBULATORY_CARE_PROVIDER_SITE_OTHER): Payer: 59 | Admitting: Nurse Practitioner

## 2022-05-29 ENCOUNTER — Encounter: Payer: Self-pay | Admitting: Nurse Practitioner

## 2022-05-29 VITALS — BP 104/60 | HR 66 | Temp 97.7°F | Ht 74.0 in | Wt 245.0 lb

## 2022-05-29 DIAGNOSIS — E1149 Type 2 diabetes mellitus with other diabetic neurological complication: Secondary | ICD-10-CM

## 2022-05-29 DIAGNOSIS — Z89511 Acquired absence of right leg below knee: Secondary | ICD-10-CM

## 2022-05-29 DIAGNOSIS — E785 Hyperlipidemia, unspecified: Secondary | ICD-10-CM | POA: Diagnosis not present

## 2022-05-29 DIAGNOSIS — N182 Chronic kidney disease, stage 2 (mild): Secondary | ICD-10-CM

## 2022-05-29 DIAGNOSIS — E66812 Obesity, class 2: Secondary | ICD-10-CM

## 2022-05-29 DIAGNOSIS — L97221 Non-pressure chronic ulcer of left calf limited to breakdown of skin: Secondary | ICD-10-CM | POA: Diagnosis not present

## 2022-05-29 DIAGNOSIS — E1140 Type 2 diabetes mellitus with diabetic neuropathy, unspecified: Secondary | ICD-10-CM

## 2022-05-29 DIAGNOSIS — Z79899 Other long term (current) drug therapy: Secondary | ICD-10-CM | POA: Diagnosis not present

## 2022-05-29 DIAGNOSIS — F419 Anxiety disorder, unspecified: Secondary | ICD-10-CM

## 2022-05-29 DIAGNOSIS — E559 Vitamin D deficiency, unspecified: Secondary | ICD-10-CM

## 2022-05-29 DIAGNOSIS — F17200 Nicotine dependence, unspecified, uncomplicated: Secondary | ICD-10-CM

## 2022-05-29 DIAGNOSIS — E1122 Type 2 diabetes mellitus with diabetic chronic kidney disease: Secondary | ICD-10-CM

## 2022-05-29 DIAGNOSIS — I10 Essential (primary) hypertension: Secondary | ICD-10-CM

## 2022-05-29 DIAGNOSIS — E1169 Type 2 diabetes mellitus with other specified complication: Secondary | ICD-10-CM

## 2022-05-29 DIAGNOSIS — K219 Gastro-esophageal reflux disease without esophagitis: Secondary | ICD-10-CM

## 2022-05-29 DIAGNOSIS — J449 Chronic obstructive pulmonary disease, unspecified: Secondary | ICD-10-CM | POA: Diagnosis not present

## 2022-05-29 LAB — CBC WITH DIFFERENTIAL/PLATELET
Eosinophils Relative: 1.5 %
HCT: 41.5 % (ref 38.5–50.0)
MCH: 32.1 pg (ref 27.0–33.0)
WBC: 13 10*3/uL — ABNORMAL HIGH (ref 3.8–10.8)

## 2022-05-29 MED ORDER — LISINOPRIL 10 MG PO TABS
10.0000 mg | ORAL_TABLET | Freq: Every day | ORAL | 11 refills | Status: DC
Start: 2022-05-29 — End: 2023-05-12

## 2022-05-29 MED ORDER — TRULICITY 3 MG/0.5ML ~~LOC~~ SOAJ
3.0000 mg | SUBCUTANEOUS | 3 refills | Status: DC
Start: 2022-05-29 — End: 2022-12-12

## 2022-05-29 MED ORDER — SULFAMETHOXAZOLE-TRIMETHOPRIM 800-160 MG PO TABS
1.0000 | ORAL_TABLET | Freq: Two times a day (BID) | ORAL | 0 refills | Status: DC
Start: 2022-05-29 — End: 2022-07-31

## 2022-05-29 NOTE — Patient Instructions (Addendum)
Take Bactrim 1 tab twice a day for 7 days If no improvement when you return in 1 week will have you seen at wound center  Stop Lisinopril 20 mg QD and start Lisinopril 10 mg daily- continue to check BP. I sent the new dose to your pharmacy  I sent in new script for Trulicity 3 mg since unable to get 4.5 due to backorder  Venous Ulcer A venous ulcer is a shallow sore on your lower leg. Venous ulcer is the most common type of lower leg ulcer. You may have venous ulcers on one leg or on both legs. This condition most often develops around your ankles. This type of ulcer may last for a long time (chronic ulcer) or it may return often (recurrent ulcer). What are the causes? This condition is caused by poor blood flow in your legs. The poor flow causes blood to pool in your legs. This can break the skin, causing an ulcer. What increases the risk? You are more likely to develop this condition if: You are male. You are 83 years of age or older. You have had a leg ulcer in the past. You have varicose veins. You have clots in your lower leg veins (deep vein thrombosis). You have irritation and swelling (inflammation) of your leg veins (phlebitis). You have recently been pregnant. You have a family history of chronic venous insufficiency. This is a condition where the leg veins do not pump enough blood from the legs to the heart. Your risk may be higher if: You are not active. You are overweight. You smoke. What are the signs or symptoms? The main symptom of this condition is an open sore near your ankle. Other symptoms may include: Swelling. Fluid coming from the ulcer. Bleeding. Itching. Pain and swelling. This gets worse when you stand up and feels better when you raise your leg. Changes in the skin, such as: Thick skin. Blotchy skin. Dark skin. How is this treated? Treatments include: Keeping your leg raised (elevated). Wearing a type of bandage or stocking to keep pressure  (compression) on the veins of your leg. Taking medicines, including antibiotic medicines. Cleaning your ulcer and removing any dead tissue from the wound. Using bandages and wraps that have medicines in them to cover your ulcer. Closing the wound using a piece of skin taken from another area of your body (graft). Healing may take a long time. You may need to see a foot doctor or a vein specialist. Follow these instructions at home: Medicines Take or apply over-the-counter and prescription medicines only as told by your doctor. If you were prescribed antibiotics, take them as told by your doctor. Do not stop taking them even if you start to feel better. Ask your doctor if you should take aspirin before long trips. Wound care Follow instructions from your doctor about how to take care of your wound. Make sure you: Wash your hands with soap and water for at least 20 seconds before and after you change your bandage. If you cannot use soap and water, use hand sanitizer. Change your bandage. Leave stitches or skin glue in place for at least 2 weeks. Leave tape strips alone unless you are told to take them off. You may trim the edges of the tape strips if they curl up. Ask when you should remove your bandage. If your bandage is dry and sticks to your leg when you try to remove it, moisten or wet the bandage with saline solution or water alone  to make it easier to remove. Check your wound every day for signs of infection. Have a caregiver do this for you if you are not able to do it yourself. Check for: More redness, swelling, or pain. More fluid or blood. Warmth. Pus or a bad smell. Activity Get up to take short walks every 1 to 2 hours. Ask for help if you feel weak or unsteady. Rest with your legs raised during the day. If you can, keep your legs above the level of your heart for 30 minutes, 3-4 times a day, or as told by your doctor. Do not sit with your legs crossed. Return to your normal  activities when your doctor says that it is safe. General instructions  Wear elastic stockings, compression stockings, or support hose as told by your doctor. Raise the foot of your bed as told by your doctor. Do not smoke or use any products that contain nicotine or tobacco. If you need help quitting, ask your doctor. Try to eat a heart healthy and low salt diet. Keep a healthy body weight. Keep all follow-up visits. Your doctor will check if your ulcer is healing and change treatments if needed. Contact a doctor if: Your ulcer is getting larger or is not healing. Your pain gets worse. You have any signs of infection. You have a fever. This information is not intended to replace advice given to you by your health care provider. Make sure you discuss any questions you have with your health care provider. Document Revised: 08/27/2021 Document Reviewed: 08/27/2021 Elsevier Patient Education  2023 [employer].

## 2022-05-30 ENCOUNTER — Other Ambulatory Visit: Payer: Self-pay | Admitting: Nurse Practitioner

## 2022-05-30 ENCOUNTER — Telehealth: Payer: Self-pay

## 2022-05-30 DIAGNOSIS — L97221 Non-pressure chronic ulcer of left calf limited to breakdown of skin: Secondary | ICD-10-CM

## 2022-05-30 LAB — CBC WITH DIFFERENTIAL/PLATELET
Absolute Monocytes: 1222 cells/uL — ABNORMAL HIGH (ref 200–950)
Basophils Absolute: 78 cells/uL (ref 0–200)
Basophils Relative: 0.6 %
Eosinophils Absolute: 195 cells/uL (ref 15–500)
Hemoglobin: 14.1 g/dL (ref 13.2–17.1)
Lymphs Abs: 4173 cells/uL — ABNORMAL HIGH (ref 850–3900)
MCHC: 34 g/dL (ref 32.0–36.0)
MCV: 94.5 fL (ref 80.0–100.0)
MPV: 11.7 fL (ref 7.5–12.5)
Monocytes Relative: 9.4 %
Neutro Abs: 7332 cells/uL (ref 1500–7800)
Neutrophils Relative %: 56.4 %
Platelets: 225 10*3/uL (ref 140–400)
RBC: 4.39 10*6/uL (ref 4.20–5.80)
RDW: 12.5 % (ref 11.0–15.0)
Total Lymphocyte: 32.1 %

## 2022-05-30 LAB — TSH: TSH: 1.99 mIU/L (ref 0.40–4.50)

## 2022-05-30 LAB — HEMOGLOBIN A1C
Hgb A1c MFr Bld: 5.8 % of total Hgb — ABNORMAL HIGH (ref <5.7)
Mean Plasma Glucose: 120 mg/dL
eAG (mmol/L): 6.6 mmol/L

## 2022-05-30 LAB — COMPLETE METABOLIC PANEL WITH GFR
AG Ratio: 1.5 (calc) (ref 1.0–2.5)
ALT: 26 U/L (ref 9–46)
AST: 22 U/L (ref 10–35)
Albumin: 4 g/dL (ref 3.6–5.1)
Alkaline phosphatase (APISO): 62 U/L (ref 35–144)
BUN: 22 mg/dL (ref 7–25)
CO2: 24 mmol/L (ref 20–32)
Calcium: 9.4 mg/dL (ref 8.6–10.3)
Chloride: 103 mmol/L (ref 98–110)
Creat: 1.12 mg/dL (ref 0.70–1.35)
Globulin: 2.6 g/dL (calc) (ref 1.9–3.7)
Glucose, Bld: 85 mg/dL (ref 65–99)
Potassium: 4.9 mmol/L (ref 3.5–5.3)
Sodium: 138 mmol/L (ref 135–146)
Total Bilirubin: 0.3 mg/dL (ref 0.2–1.2)
Total Protein: 6.6 g/dL (ref 6.1–8.1)
eGFR: 74 mL/min/{1.73_m2} (ref >=60)

## 2022-05-30 LAB — LIPID PANEL
Cholesterol: 111 mg/dL (ref <200)
HDL: 32 mg/dL — ABNORMAL LOW (ref >=40)
LDL Cholesterol (Calc): 50 mg/dL (calc)
Non-HDL Cholesterol (Calc): 79 mg/dL (calc) (ref <130)
Total CHOL/HDL Ratio: 3.5 (calc) (ref <5.0)
Triglycerides: 231 mg/dL — ABNORMAL HIGH (ref <150)

## 2022-05-30 NOTE — Telephone Encounter (Addendum)
Trulicity 3ml prior auth completed and submitted.

## 2022-05-31 NOTE — Telephone Encounter (Signed)
Prior auth approved through 05/31/23

## 2022-06-05 NOTE — Progress Notes (Unsigned)
FOLLOW UP   Assessment and Plan:   Kino was seen today for follow-up.  Diagnoses and all orders for this visit:  Essential Hypertension Continue verapamil and HCTZ 25 mg 1 tab daily Decrease Lisinopril to 10 mg 1 tab PO QD Monitor blood pressure at home; patient to call if consistently greater than 130/80 Continue DASH diet.   Reminder to go to the ER if any CP, SOB, nausea, dizziness, severe HA, changes vision/speech, left arm numbness and tingling and jaw pain.    Type 2 diabetes mellitus with stage 2 chronic kidney disease, without long-term current use of insulin ([name]) Discussed dietary and exercise modifications Continue Glucophage 500 mg 2 tab BID a Trulicity 3.0 mg SQ QW sent in since unable to get 4.5 mg due to backorder issue Keep blood sugar log daily and bring to next visit  COPD([name]) Advised to stop smoking - risks discussed. Declines medications.  Currently stable without respiratory medications  R BTK amputation ([name]) Stump healed well; completed PT New sleeve fitted yesterday   Skin ulcer of calf, left, limited to breakdown of skin ([name]) Keep area clean and dry Completed course of Bactrim Has been referred to wound clinic and has an appointment 06/10/22- would like him to keep appt at wound clinic for second opinion as he has already had a R BTK amputation due to foot abscess Continue compression sock and elevating foot           Continue diet and meds as discussed. Further disposition pending results of labs. Discussed med's effects and SE's.   Over 30 minutes of face to face interview, exam, counseling, chart review, and critical decision making was performed.   Future Appointments  Date Time Provider Department Center  06/10/2022  9:30 AM Allen Derry Walnut III, PA-C [facility] None  09/10/2022  2:00 PM Lucky Cowboy, MD [facility] None     ----------------------------------------------------------------------------------------------------------------------  HPI 62 y.o. male  presents for 3 month follow up on hypertension, cholesterol, diabetes, weight and vitamin D deficiency.  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 He continues to follow with orthotics for prosthesis  Ulcerated on left lower calf is resolving but remain purple/red discoloration.  He has an appointment with wound clinic in 4 days.  Finished course of antibiotics  He currently continues to smoke 1 pack a day x 45 years; Started smoking at age [age] discussed risks associated with smoking, patient is not ready to quit.    BMI is Body mass index is 31.33 kg/m., he has been working on diet and exercise Wt Readings from Last 3 Encounters:  06/06/22 244 lb (110.7 kg)  05/29/22 245 lb (111.1 kg)  02/26/22 241 lb (109.3 kg)   His blood pressure has been controlled at home  with Lisinopril 20 mg QD, HCTZ 25 mg QD, and verapamil 240 mg QD, today their BP is BP: 108/60  BP Readings from Last 3 Encounters:  06/06/22 108/60  05/29/22 104/60  02/26/22 120/60   He does workout. He denies chest pain, shortness of breath, dizziness.     He has been working on diet and exercise for T2 diabetes  Neuropathy in left foot, some numbness and weakness in hands and left foot Hyperlipidemia- Rosuvastatin 40 mg Metformin 500mg  one in morning and two in evening. Has cut Trulicity back to 3 mg, unable to find 4.5 mg due to backorder issues denies foot  ulcerations, increased appetite, nausea, polydipsia, polyuria, visual disturbances, vomiting and weight loss.  Has not checked his blood sugars  Last A1C in the office was:  Lab Results  Component Value Date   HGBA1C 5.8 (H) 05/29/2022    He has been drinking an IV hydration drink and drinks  water throughout the day. Gets up 0-1 time a night to urinate. Lab Results  Component Value Date   EGFR 74 05/29/2022    Patient is on Vitamin D supplement and at goal at recent check:    Lab Results  Component Value Date   VD25OH 74 07/19/2021         Current Medications:  Current Outpatient Medications on File Prior to Visit  Medication Sig   cetirizine (ZYRTEC) 10 MG tablet Take 10 mg by mouth daily as needed for allergies.   Cholecalciferol (VITAMIN D) 50 MCG (2000 UT) CAPS Take 2,000 Units by mouth 2 (two) times daily.   citalopram (CELEXA) 40 MG tablet TAKE 1 TABLET BY MOUTH ONCE DAILY FOR MOOD AND CHRONIC ANXIETY   clopidogrel (PLAVIX) 75 MG tablet Take  1 tablet  Daily  to Prevent Blood Clots & Strokes   Dulaglutide (TRULICITY) 3 MG/0.5ML SOPN Inject 3 mg as directed once a week.   hydrochlorothiazide (HYDRODIURIL) 25 MG tablet Take  1 tablet  Daily  for BP & Fluid Retention / Ankle Swelling   lisinopril (ZESTRIL) 10 MG tablet Take 1 tablet (10 mg total) by mouth daily.   metFORMIN (GLUCOPHAGE-XR) 500 MG 24 hr tablet Take 2 tablets 2 x /day with Meals for Diabetes.   pantoprazole (PROTONIX) 40 MG tablet Take 1 tablet (40 mg total) by mouth daily.   rosuvastatin (CRESTOR) 40 MG tablet Take one tablet daily for Cholesterol.   Study - OCEANIC-STROKE - asundexian 50 mg or placebo tablet (PI-Sethi) Take 1 tablet (50 mg total) by mouth daily. For investigational use only. Take 1 tablet by mouth once daily at the same time each day, preferably in the morning. Please bring bottle to every visit.   sulfamethoxazole-trimethoprim (BACTRIM DS) 800-160 MG tablet Take 1 tablet by mouth 2 (two) times daily.   verapamil (CALAN-SR) 240 MG CR tablet TAKE 1 TABLET BY MOUTH EVERY DAY FOR BLOOD PRESSURE AND HEART RHYTHM   No current facility-administered medications on file prior to visit.     Allergies:  Allergies  Allergen Reactions   Invokana [Canagliflozin] Other (See Comments)     Extremity edema/caused pain   Codeine Camsylate [Codeine] Rash   Morphine And Codeine Rash     Medical History:  Past Medical History:  Diagnosis Date   Diabetic neuropathy ([name])    Diverticulitis    History of hepatitis C    has been treated in the past   History of kidney stones    Hyperlipidemia    Hypertension    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[name])    Other testicular hypofunction    Severe sepsis ([name]) 08/26/2019   Type II or unspecified type diabetes mellitus without mention of complication, not stated as uncontrolled    Vitamin D deficiency    Family history- Reviewed and unchanged Social history- Reviewed and unchanged   Review of Systems:  Review of Systems  Constitutional:  Negative for malaise/fatigue and weight loss.  HENT:  Negative for hearing loss and tinnitus.   Eyes:  Negative for blurred vision and double vision.  Respiratory:  Negative for cough, shortness of breath and wheezing.   Cardiovascular:  Negative  for chest pain, palpitations, orthopnea, claudication and leg swelling.  Gastrointestinal:  Negative for abdominal pain, blood in stool, constipation, diarrhea, heartburn, melena, nausea and vomiting.  Genitourinary: Negative.   Musculoskeletal:  Negative for joint pain and myalgias.  Skin:  Negative for rash.       Skin breakdown of left calf- improving, purple discoloration remains  Neurological:  Negative for dizziness, tingling, sensory change, weakness and headaches.  Endo/Heme/Allergies:  Negative for polydipsia.  Psychiatric/Behavioral:  Negative for depression, hallucinations, memory loss and substance abuse. The patient is not nervous/anxious.   All other systems reviewed and are negative.    Physical Exam: BP 108/60   Pulse 76   Temp 97.6 F (36.4 C)   Resp 16   Ht 6\' 2"  (1.88 m)   Wt 244 lb (110.7 kg)   SpO2 98%   BMI 31.33 kg/m  Wt Readings from Last 3 Encounters:  06/06/22 244 lb (110.7 kg)  05/29/22 245 lb (111.1 kg)  02/26/22  241 lb (109.3 kg)   General Appearance: Well nourished, in no apparent distress. Eyes: PERRLA, EOMs, conjunctiva no swelling or erythema Sinuses: No Frontal/maxillary tenderness ENT/Mouth: Ext aud canals clear, TMs without erythema, bulging. No erythema, swelling, or exudate on post pharynx.  Tonsils not swollen or erythematous. Hearing normal.  Neck: Supple, thyroid normal. No carotid bruits heard Respiratory: Respiratory effort normal, BS equal bilaterally with diffuse wheezing without rales, rhonchi,  or stridor.  Cardio: RRR with no MRGs. Brisk peripheral pulses without edema.  Abdomen: Soft, + BS.  Non tender, no guarding, rebound, hernias, masses. Lymphatics: Non tender without lymphadenopathy.  Musculoskeletal:; R BTK amputation w/ prosthesis. Otherwise no deformity. Symmetrical upper extremity strength.  Skin: Warm, dry . 2 cm purple area on left calf  no further black center since completing antibiotic course Neuro: Cranial nerves intact. No cerebellar symptoms.  Psych: Awake and oriented X 3, normal affect, Insight and Judgment appropriate.    Raynelle Dick, NP 3:38 PM [facility]

## 2022-06-06 ENCOUNTER — Ambulatory Visit (INDEPENDENT_AMBULATORY_CARE_PROVIDER_SITE_OTHER): Payer: 59 | Admitting: Nurse Practitioner

## 2022-06-06 ENCOUNTER — Encounter: Payer: Self-pay | Admitting: Nurse Practitioner

## 2022-06-06 VITALS — BP 108/60 | HR 76 | Temp 97.6°F | Resp 16 | Ht 74.0 in | Wt 244.0 lb

## 2022-06-06 DIAGNOSIS — I10 Essential (primary) hypertension: Secondary | ICD-10-CM

## 2022-06-06 DIAGNOSIS — E1122 Type 2 diabetes mellitus with diabetic chronic kidney disease: Secondary | ICD-10-CM | POA: Diagnosis not present

## 2022-06-06 DIAGNOSIS — N182 Chronic kidney disease, stage 2 (mild): Secondary | ICD-10-CM

## 2022-06-06 DIAGNOSIS — Z89511 Acquired absence of right leg below knee: Secondary | ICD-10-CM

## 2022-06-06 DIAGNOSIS — J449 Chronic obstructive pulmonary disease, unspecified: Secondary | ICD-10-CM | POA: Diagnosis not present

## 2022-06-06 DIAGNOSIS — L97221 Non-pressure chronic ulcer of left calf limited to breakdown of skin: Secondary | ICD-10-CM

## 2022-06-06 NOTE — Patient Instructions (Signed)

## 2022-06-10 ENCOUNTER — Encounter: Payer: 59 | Attending: Physician Assistant | Admitting: Physician Assistant

## 2022-06-10 DIAGNOSIS — N182 Chronic kidney disease, stage 2 (mild): Secondary | ICD-10-CM | POA: Diagnosis not present

## 2022-06-10 DIAGNOSIS — I129 Hypertensive chronic kidney disease with stage 1 through stage 4 chronic kidney disease, or unspecified chronic kidney disease: Secondary | ICD-10-CM | POA: Diagnosis not present

## 2022-06-10 DIAGNOSIS — Z89511 Acquired absence of right leg below knee: Secondary | ICD-10-CM | POA: Diagnosis not present

## 2022-06-10 DIAGNOSIS — F17200 Nicotine dependence, unspecified, uncomplicated: Secondary | ICD-10-CM | POA: Diagnosis not present

## 2022-06-10 DIAGNOSIS — I87322 Chronic venous hypertension (idiopathic) with inflammation of left lower extremity: Secondary | ICD-10-CM | POA: Diagnosis not present

## 2022-06-10 DIAGNOSIS — I872 Venous insufficiency (chronic) (peripheral): Secondary | ICD-10-CM | POA: Diagnosis not present

## 2022-06-10 DIAGNOSIS — E1122 Type 2 diabetes mellitus with diabetic chronic kidney disease: Secondary | ICD-10-CM | POA: Diagnosis not present

## 2022-06-10 DIAGNOSIS — Z8619 Personal history of other infectious and parasitic diseases: Secondary | ICD-10-CM | POA: Insufficient documentation

## 2022-06-10 DIAGNOSIS — E11622 Type 2 diabetes mellitus with other skin ulcer: Secondary | ICD-10-CM | POA: Diagnosis not present

## 2022-06-10 DIAGNOSIS — L97828 Non-pressure chronic ulcer of other part of left lower leg with other specified severity: Secondary | ICD-10-CM | POA: Diagnosis not present

## 2022-06-11 NOTE — Progress Notes (Signed)
[name], [name] ([id number]) [phone] [id number] Page 1 of 4 Visit Report for 06/10/2022 Abuse Risk Screen Details Patient Name: Date of Service: [place] 06/10/2022 9:30 A M Medical Record Number: [id number] Patient Account Number: [id number] Date of Birth/Sex: Treating RN: 07/26/1960 ([name]) [name] Primary Care Bensyn Bornemann: Lucky Cowboy Other Clinician: Referring Savva Beamer: Treating Camilia Caywood/Extender: Leanne Chang in Treatment: 0 Abuse Risk Screen Items Answer ABUSE RISK SCREEN: Has anyone close to you tried to hurt or harm you recentlyo No Do you feel uncomfortable with anyone in your familyo No Has anyone forced you do things that you didnt want to doo No Electronic Signature(s) Signed: 06/11/2022 11:25:09 AM By: Michael Pax RN Entered By: [name] on 06/10/2022 09:49:06 -------------------------------------------------------------------------------- Activities of Daily Living Details Patient Name: Date of Service: [place] 06/10/2022 9:30 A M Medical Record Number: [id number] Patient Account Number: [id number] Date of Birth/Sex: Treating RN: 10-Jun-1960 ([name]) [name] Primary Care [name]: Lucky Cowboy Other Clinician: Referring [name]: Treating [name]/Extender: [name] Weeks in Treatment: 0 Activities of Daily Living Items Answer Activities of Daily Living (Please select one for each item) Drive Automobile Completely Able T Medications ake Completely Able Use T elephone Completely Able Care for Appearance Completely Able Use T oilet Completely Able Bath / Shower Completely Able Dress Self Completely Able Feed Self Completely Able Walk Completely Able Get In / Out Bed Completely Able Housework Completely Able Prepare Meals Completely Able Handle Money Completely Able Shop for Self Completely Able Electronic Signature(s) Signed: 06/11/2022  11:25:09 AM By: Michael Pax RN Entered By: [name] on 06/10/2022 09:49:29 -------------------------------------------------------------------------------- Education Screening Details Patient Name: Date of Service: [place] [name], RO BERT C. 06/10/2022 9:30 A M Medical Record Number: [id number] Patient Account Number: [id number] Date of Birth/Sex: Treating RN: 1960-09-04 ([name]) [name] Primary Care [name]: Lucky Cowboy Other Clinician: Referring [name]: Treating [name]/Extender: Leanne Chang in Treatment: 0 LABRANDON, Arellano ([id number]) [id number] Page 2 of 4 Primary Learner Assessed: Patient Learning Preferences/Education Level/Primary Language Learning Preference: Explanation Highest Education Level: High School Preferred Language: English Cognitive Barrier Language Barrier: No Translator Needed: No Memory Deficit: No Emotional Barrier: No Cultural/Religious Beliefs Affecting Medical Care: No Physical Barrier Impaired Vision: No Impaired Hearing: No Decreased Hand dexterity: No Knowledge/Comprehension Knowledge Level: Medium Comprehension Level: Medium Ability to understand written instructions: Medium Ability to understand verbal instructions: Medium Motivation Anxiety Level: Anxious Cooperation: Cooperative Education Importance: Acknowledges Need Interest in Health Problems: Asks Questions Perception: Coherent Willingness to Engage in Self-Management High Activities: Readiness to Engage in Self-Management High Activities: Electronic Signature(s) Signed: 06/11/2022 11:25:09 AM By: Michael Pax RN Entered By: [name] on 06/10/2022 09:50:29 -------------------------------------------------------------------------------- Fall Risk Assessment Details Patient Name: Date of Service: MA [name], RO BERT C. 06/10/2022 9:30 A M Medical Record Number: [id number] Patient Account Number:  [id number] Date of Birth/Sex: Treating RN: [birth year] ([name]) [name] Primary Care [name]: Lucky Cowboy Other Clinician: Referring [name]: Treating [name]/Extender: [name] Weeks in Treatment: 0 Fall Risk Assessment Items Have you had 2 or more falls in the last 12 monthso 0 No Have you had any fall that resulted in injury in the last 12 monthso 0 No FALLS RISK SCREEN History of falling - immediate or within 3 months 0 No Secondary diagnosis (Do you have 2 or more medical diagnoseso) 0 No Ambulatory aid None/bed rest/wheelchair/nurse 0 Yes Crutches/cane/walker  0 No Furniture 0 No Intravenous therapy Access/Saline/Heparin Lock 0 No Gait/Transferring Normal/ bed rest/ wheelchair 0 Yes Weak (short steps with or without shuffle, stooped but able to lift head while walking, may seek 0 No support from furniture) Impaired (short steps with shuffle, may have difficulty arising from chair, head down, impaired 0 No balance) Mental Status Oriented to own ability 0 Yes Overestimates or forgets limitations 0 No Risk Level: Low Risk Score: 0 RYETT, EASTER ([id number]) [phone] [id number] Page 3 of 4 Electronic Signature(s) -------------------------------------------------------------------------------- Foot Assessment Details Patient Name: Date of Service: MA [name] 06/10/2022 9:30 A M Medical Record Number: [id number] Patient Account Number: [id number] Date of Birth/Sex: Treating RN: [birth year] ([name]) [name] Primary Care [name]: Lucky Cowboy Other Clinician: Referring [name]: Treating [name]/Extender: [name] Weeks in Treatment: 0 Foot Assessment Items Site Locations + = Sensation present, - = Sensation absent, C = Callus, U = Ulcer R = Redness, W = Warmth, M = Maceration, PU = Pre-ulcerative lesion F = Fissure, S = Swelling, D = Dryness Assessment Right:  Left: Other Deformity: No No Prior Foot Ulcer: No No Prior Amputation: No No Charcot Joint: No No Ambulatory Status: Ambulatory Without Help Gait: Steady Electronic Signature(s) Signed: 06/11/2022 11:25:09 AM By: Michael Pax RN Entered By: [name] on 06/10/2022 09:51:08 -------------------------------------------------------------------------------- Nutrition Risk Screening Details Patient Name: Date of Service: MA [name] 06/10/2022 9:30 A M Medical Record Number: [id number] Patient Account Number: [id number] Date of Birth/Sex: Treating RN: Nov 05, 1960 (61 y.o. Male) [name] Primary Care Lucita Montoya: Lucky Cowboy Other Clinician: Referring Shandricka Monroy: Treating Vinton Layson/Extender: [name] Weeks in Treatment: 0 Height (in): 75 Weight (lbs): 240 Body Mass Index (BMI): 30 REYNIEL, GILCREST C ([id number]) [phone] [id number] Page 4 of 4 Nutrition Risk Screening Items Score Screening NUTRITION RISK SCREEN: I have an illness or condition that made me change the kind and/or amount of food I eat 0 No I eat fewer than two meals per day 0 No I eat few fruits and vegetables, or milk products 0 No I have three or more drinks of beer, liquor or wine almost every day 0 No I have tooth or mouth problems that make it hard for me to eat 0 No I don't always have enough money to buy the food I need 0 No I eat alone most of the time 0 No I take three or more different prescribed or over-the-counter drugs a day 1 Yes Without wanting to, I have lost or gained 10 pounds in the last six months 0 No I am not always physically able to shop, cook and/or feed myself 0 No Nutrition Protocols Good Risk Protocol 0 No interventions needed Moderate Risk Protocol High Risk Proctocol Risk Level: Good Risk Score: 1 Electronic Signature(s) Signed: 06/11/2022 11:25:09 AM By: Michael Pax RN Entered By: [name] on 06/10/2022 09:50:57

## 2022-07-08 NOTE — Progress Notes (Signed)
KARTEL, WOHLWEND ([id number]) [id number] Page 1 of 7 Visit Report for 06/10/2022 Chief Complaint Document Details Patient Name: Date of Service: [place] [name] 06/10/2022 9:30 A M Medical Record Number: [id number] Patient Account Number: [id number] Date of Birth/Sex: Treating RN: 25-Apr-1960 (61 y.o. Male) [name] Primary Care Provider: Lucky Arellano Other Clinician: Referring Provider: Treating Provider/Extender: [name] Weeks in Treatment: 0 Information Obtained from: Patient Chief Complaint Left leg swelling with intermittent ulcers Electronic Signature(s) Signed: 06/10/2022 10:07:35 AM By: Michael Derry PA-C Entered By: [name] on 06/10/2022 10:07:34 -------------------------------------------------------------------------------- HPI Details Patient Name: Date of Service: [name], Michael BERT C. 06/10/2022 9:30 A M Medical Record Number: [id number] Patient Account Number: [id number] Date of Birth/Sex: Treating RN: 1960-10-14 (61 y.o. Male) [name] Primary Care Provider: Lucky Arellano Other Clinician: Referring Provider: Treating Provider/Extender: [name] in Treatment: 0 History of Present Illness HPI Description: 06-10-2022 upon evaluation today patient presents for initial inspection here in our clinic concerning a wound that was actually present on his leg. The good news is this actually appears to be healed at this point. He does have a history of diabetes mellitus type 2, chronic venous insufficiency, hypertension, chronic kidney disease stage II, and a right below-knee amputation. This was actually his left leg that was affected. With that being said he tells me that does tend to swell and when it does he tends to get blisters which in turn opened up and then cause sores and ulcerations that [name] quite a bit of discomfort and pain. With that being said I did have a pretty long and  lengthy conversation with the patient today as did carry my nurse about wearing his compression socks he is aware what he needs to do at this point and he was actually very appreciative of the teaching information. Electronic Signature(s) Signed: 06/12/2022 5:34:07 PM By: Michael Derry PA-C Entered By: [name] on 06/12/2022 17:34:07 [name] ([id number]) [id number] Page 2 of 7 -------------------------------------------------------------------------------- Physical Exam Details Patient Name: Date of Service: [place] [name] 06/10/2022 9:30 A M Medical Record Number: [id number] Patient Account Number: [id number] Date of Birth/Sex: Treating RN: 25-Dec-1960 (61 y.o. Male) [name] Primary Care Provider: Lucky Arellano Other Clinician: Referring Provider: Treating Provider/Extender: [name] Weeks in Treatment: 0 Constitutional sitting or standing blood pressure is within target range for patient.. pulse regular and within target range for patient.Marland Kitchen respirations regular, non-labored and within target range for patient.Marland Kitchen temperature within target range for patient.. Well-nourished and well-hydrated in no acute distress. Eyes conjunctiva clear no eyelid edema noted. pupils equal round and reactive to light and accommodation. Ears, Nose, Mouth, and Throat no gross abnormality of ear auricles or external auditory canals. normal hearing noted during conversation. mucus membranes moist. Respiratory normal breathing without difficulty. Cardiovascular 2+ dorsalis pedis/posterior tibialis pulses. no clubbing, cyanosis, significant edema, <3 sec cap refill. Musculoskeletal normal gait and posture. no significant deformity or arthritic changes, no loss or range of motion, no clubbing. Psychiatric this patient is able to make decisions and demonstrates good insight into disease process. Alert and Oriented x 3. pleasant and  cooperative. Notes Upon inspection patient did not appear to have any obvious signs of wounds at this point. The focus of the appointment today was primarily on things that the patient can do in order to help prevent wounds from occurring in the future. We discussed the fact that he needs to make sure  to keep his legs elevated when he is sitting he is to wear his compression socks and again based on what we are seeing and he seems to have really pretty good arterial flow I do not see any signs of obvious limitation he seems to heal quite readily so I think if he is able to keep the swelling under control he will do quite well. Electronic Signature(s) Signed: 06/12/2022 5:34:45 PM By: Michael Derry PA-C Entered By: [name] on 06/12/2022 17:34:45 -------------------------------------------------------------------------------- Physician Orders Details Patient Name: Date of Service: [name], Michael BERT C. 06/10/2022 9:30 A M Medical Record Number: [id number] Patient Account Number: [id number] Date of Birth/Sex: Treating RN: [birth year] (61 y.o. Male) [name] Primary Care Provider: Lucky Arellano Other Clinician: Referring Provider: Treating Provider/Extender: [name] in Treatment: 0 Verbal / Phone Orders: No [name], [name] ([id number]) [id number] Page 3 of 7 Diagnosis Coding ICD-10 Coding Code Description E11.622 Type 2 diabetes mellitus with other skin ulcer I87.322 Chronic venous hypertension (idiopathic) with inflammation of left lower extremity I10 Essential (primary) hypertension N18.2 Chronic kidney disease, stage 2 (mild) Z89.511 Acquired absence of right leg below knee Discharge From [facility] Services Consult Only Wear compression garments daily. Put garments on first thing when you wake up and remove them before bed. Moisturize legs daily after removing compression garments. Elevate, Exercise Daily and A void Standing for  Long Periods of Time. Electronic Signature(s) Signed: 06/11/2022 11:25:09 AM By: Michael Pax RN Signed: 06/12/2022 5:35:29 PM By: Michael Derry PA-C Entered By: [name] on 06/10/2022 10:09:33 -------------------------------------------------------------------------------- Problem List Details Patient Name: Date of Service: [name], Michael BERT C. 06/10/2022 9:30 A M Medical Record Number: [id number] Patient Account Number: [id number] Date of Birth/Sex: Treating RN: 1960-04-24 (61 y.o. Male) [name] Primary Care Provider: Lucky Arellano Other Clinician: Referring Provider: Treating Provider/Extender: [name] Weeks in Treatment: 0 Active Problems ICD-10 Encounter Code Description Active Date MDM Diagnosis E11.622 Type 2 diabetes mellitus with other skin ulcer 06/10/2022 No Yes I87.322 Chronic venous hypertension (idiopathic) with inflammation of left lower 06/10/2022 No Yes extremity I10 Essential (primary) hypertension 06/10/2022 No Yes N18.2 Chronic kidney disease, stage 2 (mild) 06/10/2022 No Yes Z89.511 Acquired absence of right leg below knee 06/10/2022 No Yes Inactive Problems KEYONTE, FAUCI ([id number]) [id number] Page 4 of 7 Resolved Problems Electronic Signature(s) Signed: 06/10/2022 10:07:56 AM By: Michael Derry PA-C Previous Signature: 06/10/2022 10:07:05 AM Version By: Michael Derry PA-C Entered By: [name] on 06/10/2022 10:07:56 -------------------------------------------------------------------------------- Progress Note Details Patient Name: Date of Service: Michael SO N, Michael BERT C. 06/10/2022 9:30 A M Medical Record Number: [id number] Patient Account Number: [id number] Date of Birth/Sex: Treating RN: 09-11-1960 (61 y.o. Male) [name] Primary Care Provider: Lucky Arellano Other Clinician: Referring Provider: Treating Provider/Extender: [name] Weeks in Treatment: 0 Subjective Chief  Complaint Information obtained from Patient Left leg swelling with intermittent ulcers History of Present Illness (HPI) 06-10-2022 upon evaluation today patient presents for initial inspection here in our clinic concerning a wound that was actually present on his leg. The good news is this actually appears to be healed at this point. He does have a history of diabetes mellitus type 2, chronic venous insufficiency, hypertension, chronic kidney disease stage II, and a right below-knee amputation. This was actually his left leg that was affected. With that being said he tells me that does tend to swell and when it does he tends to get blisters which in turn opened  up and then cause sores and ulcerations that [name] quite a bit of discomfort and pain. With that being said I did have a pretty long and lengthy conversation with the patient today as did carry my nurse about wearing his compression socks he is aware what he needs to do at this point and he was actually very appreciative of the teaching information. Patient History Allergies Invokana, codeine, morphine Social History Current every day smoker, Marital Status - Married, Alcohol Use - Never, Drug Use - Current History - grass, Caffeine Use - Daily. Medical History Cardiovascular Patient has history of Hypertension Gastrointestinal Patient has history of Hepatitis C Endocrine Patient has history of Type II Diabetes Patient is treated with Insulin, Oral Agents. Objective Constitutional sitting or standing blood pressure is within target range for patient.. pulse regular and within target range for patient.Marland Kitchen respirations regular, non-labored and within target range for patient.Marland Kitchen temperature within target range for patient.. Well-nourished and well-hydrated in no acute distress. Vitals Time Taken: 9:43 AM, Height: 75 in, Source: Stated, Weight: 240 lbs, Source: Stated, BMI: 30, Temperature: 97.8 F, Pulse: 60 bpm, Respiratory Rate: 18  breaths/min, Blood Pressure: 119/70 mmHg. [name], [name] ([id number]) [id number] Page 5 of 7 Eyes conjunctiva clear no eyelid edema noted. pupils equal round and reactive to light and accommodation. Ears, Nose, Mouth, and Throat no gross abnormality of ear auricles or external auditory canals. normal hearing noted during conversation. mucus membranes moist. Respiratory normal breathing without difficulty. Cardiovascular 2+ dorsalis pedis/posterior tibialis pulses. no clubbing, cyanosis, significant edema, Musculoskeletal normal gait and posture. no significant deformity or arthritic changes, no loss or range of motion, no clubbing. Psychiatric this patient is able to make decisions and demonstrates good insight into disease process. Alert and Oriented x 3. pleasant and cooperative. General Notes: Upon inspection patient did not appear to have any obvious signs of wounds at this point. The focus of the appointment today was primarily on things that the patient can do in order to help prevent wounds from occurring in the future. We discussed the fact that he needs to make sure to keep his legs elevated when he is sitting he is to wear his compression socks and again based on what we are seeing and he seems to have really pretty good arterial flow I do not see any signs of obvious limitation he seems to heal quite readily so I think if he is able to keep the swelling under control he will do quite well. Assessment Active Problems ICD-10 Type 2 diabetes mellitus with other skin ulcer Chronic venous hypertension (idiopathic) with inflammation of left lower extremity Essential (primary) hypertension Chronic kidney disease, stage 2 (mild) Acquired absence of right leg below knee Plan Discharge From [facility] Services: Consult Only Wear compression garments daily. Put garments on first thing when you wake up and remove them before bed. Moisturize legs daily after removing  compression garments. Elevate, Exercise Daily and Avoid Standing for Long Periods of Time. 1. I am good recommend that we have the patient go ahead and discontinue with wound care services I do not believe he is going to need any additional follow- up at this point. 2. I am good recommend as well that he should be wearing his compression socks daily he is aware of what he needs to do and I think he will do it he seems pretty compliant in my opinion. Will see him back for follow-up visit as needed. Electronic Signature(s) Signed: 06/12/2022 5:35:06 PM By: Michael Derry  PA-C Entered By: [name] on 06/12/2022 17:35:06 -------------------------------------------------------------------------------- ROS/PFSH Details Patient Name: Date of Service: [name], Michael BERT C. 06/10/2022 9:30 A M Medical Record Number: [id number] Patient Account Number: [id number] [name], [name] ([id number]) [id number] Page 6 of 7 Date of Birth/Sex: Treating RN: 1960/06/10 ([name]) [name] Primary Care Provider: Other Clinician: Lucky Arellano Referring Provider: Treating Provider/Extender: [name] in Treatment: 0 Cardiovascular Medical History: Positive for: Hypertension Gastrointestinal Medical History: Positive for: Hepatitis C Endocrine Medical History: Positive for: Type II Diabetes Time with diabetes: 14 years Treated with: Insulin, Oral agents Immunizations Pneumococcal Vaccine: Received Pneumococcal Vaccination: No Implantable Devices None Family and Social History Current every day smoker; Marital Status - Married; Alcohol Use: Never; Drug Use: Current History - grass; Caffeine Use: Daily Electronic Signature(s) Signed: 06/11/2022 11:25:09 AM By: Michael Pax RN Signed: 06/12/2022 5:35:29 PM By: Michael Derry PA-C Entered By: [name] on 06/10/2022  09:48:59 -------------------------------------------------------------------------------- SuperBill Details Patient Name: Date of Service: Michael Andria Rhein C. 06/10/2022 Medical Record Number: [id number] Patient Account Number: [id number] Date of Birth/Sex: Treating RN: September 24, 1960 (61 y.o. Male) [name] Primary Care Provider: Lucky Arellano Other Clinician: Referring Provider: Treating Provider/Extender: [name] in Treatment: 0 Diagnosis Coding ICD-10 Codes Code Description E11.622 Type 2 diabetes mellitus with other skin ulcer I87.322 Chronic venous hypertension (idiopathic) with inflammation of left lower extremity I10 Essential (primary) hypertension N18.2 Chronic kidney disease, stage 2 (mild) Z89.511 Acquired absence of right leg below knee Facility Procedures [name], [name] ([id number]): CPT4 Code Description [id number] - WOUND CARE VISIT-LEV 3 EST PT [id number] Page 7 of 7: Modifier Quantity 1 Physician Procedures : CPT4 Code Description Modifier [id number] WC PHYS LEVEL 3 NEW PT ICD-10 Diagnosis Description E11.622 Type 2 diabetes mellitus with other skin ulcer I87.322 Chronic venous hypertension (idiopathic) with inflammation of left lower extremity I10 Essential  (primary) hypertension N18.2 Chronic kidney disease, stage 2 (mild) Quantity: 1 Electronic Signature(s) Signed: 06/12/2022 5:35:19 PM By: Michael Derry PA-C Previous Signature: 06/11/2022 11:25:09 AM Version By: Michael Pax RN Entered By: [name] on 06/12/2022 17:35:19

## 2022-07-24 ENCOUNTER — Encounter: Payer: 59 | Admitting: Internal Medicine

## 2022-07-28 ENCOUNTER — Other Ambulatory Visit: Payer: Self-pay | Admitting: Nurse Practitioner

## 2022-07-28 DIAGNOSIS — I493 Ventricular premature depolarization: Secondary | ICD-10-CM

## 2022-07-31 ENCOUNTER — Ambulatory Visit (INDEPENDENT_AMBULATORY_CARE_PROVIDER_SITE_OTHER): Payer: 59 | Admitting: Nurse Practitioner

## 2022-07-31 ENCOUNTER — Encounter: Payer: Self-pay | Admitting: Nurse Practitioner

## 2022-07-31 VITALS — BP 96/58 | HR 69 | Temp 97.7°F | Ht 74.0 in | Wt 250.2 lb

## 2022-07-31 DIAGNOSIS — I10 Essential (primary) hypertension: Secondary | ICD-10-CM

## 2022-07-31 DIAGNOSIS — Z89511 Acquired absence of right leg below knee: Secondary | ICD-10-CM

## 2022-07-31 DIAGNOSIS — N182 Chronic kidney disease, stage 2 (mild): Secondary | ICD-10-CM | POA: Diagnosis not present

## 2022-07-31 DIAGNOSIS — E1122 Type 2 diabetes mellitus with diabetic chronic kidney disease: Secondary | ICD-10-CM | POA: Diagnosis not present

## 2022-07-31 DIAGNOSIS — T148XXA Other injury of unspecified body region, initial encounter: Secondary | ICD-10-CM

## 2022-07-31 MED ORDER — MUPIROCIN CALCIUM 2 % EX CREA
1.0000 | TOPICAL_CREAM | Freq: Two times a day (BID) | CUTANEOUS | 0 refills | Status: DC
Start: 2022-07-31 — End: 2022-09-24

## 2022-07-31 NOTE — Progress Notes (Signed)
Assessment and Plan:  [name] was seen today for acute visit.  Diagnoses and all orders for this visit:  Essential hypertension - continue medications, DASH diet, exercise and monitor at home. Call if greater than 130/80.   Type 2 diabetes mellitus with stage 2 chronic kidney disease, without long-term current use of insulin ([name]) Continue Metformin, diet and exercise Will hold Trulicity for now due to severe diarrhea that occurred.  If A1c is elevated at next check will restart Trulicity or Ozempic but start at low dose and taper up.  Continue to check skin regularly Check blood sugars daily  Multiple skin tears Apply Mupirocin ointment to areas twice a day and leave open to the air.  If start to get drainage, redness or pain at site notify the office -     mupirocin cream (BACTROBAN) 2 %; Apply 1 Application topically 2 (two) times daily.   Right below the knee amputee([name]) Once he gets information for ordering of prosthetic sleeve from Prosthetic provider advise office and will order new ones for patient    Further disposition pending results of labs. Discussed med's effects and SE's.   Over 30 minutes of exam, counseling, chart review, and critical decision making was performed.   Future Appointments  Date Time Provider Department Center  09/10/2022  2:00 PM Michael Cowboy, MD [facility] None    ------------------------------------------------------------------------------------------------------------------   HPI BP (!) 96/58   Pulse 69   Temp 97.7 F (36.5 C)   Ht 6\' 2"  (1.88 m)   Wt 250 lb 3.2 oz (113.5 kg)   SpO2 94%   BMI 32.12 kg/m  62 y.o.male presents for  Poorly controlled diabetic for many years, unfortunately developed R foot abcess, underwent [name] on 09/01/2019 due to osteomyelitis of his right foot and sepsis. He continues to follow with orthotics for prosthesis. He has 2 areas on his left lower shin- 1 area is from prosthesis crossing over his  shin which is 1-2 cm open and red, no warmth or  drainage. 2nd skin tear on outer aspect of left lower calf approx 2-3 cm, superficial no warmth or drainage  He does need need order for prosthetic sleeve but does have a special type encore IV that is working best.  Will check with prosthetic company for exact information so can get reordered   He currently continues to smoke 1 pack a day x 45 years; Started smoking at age [age] discussed risks associated with smoking, patient is not ready to quit.    BP is well controlled with Lisinopril 20 mg QD, HCTZ 25 mg QD, and verapamil 240 mg every day. Today's BP is: BP Readings from Last 3 Encounters:  07/31/22 (!) 96/58  06/06/22 108/60  05/29/22 104/60   BMI is Body mass index is 32.12 kg/m., he has not been working on diet and exercise. Wt Readings from Last 3 Encounters:  07/31/22 250 lb 3.2 oz (113.5 kg)  06/06/22 244 lb (110.7 kg)  05/29/22 245 lb (111.1 kg)   He has been working on diet and exercise for T2 diabetes  Neuropathy in bil feet, some numbness and weakness in hands and feet, was on gabapentin but recently improved Hyperlipidemia- Rosuvastatin 40 mg Metformin 500mg  one in morning and two in evening. He had been out of Trulicity  and restarted at 3 mg and had severe diarrhea- plans to stay off Trulicity currently He checks sugars occasionally, running low 100's- 140 Last A1C in the office was:  Lab Results  Component Value Date   HGBA1C 5.8 (H) 05/29/2022    Past Medical History:  Diagnosis Date   Diabetic neuropathy ([name])    Diverticulitis    History of hepatitis C    has been treated in the past   History of kidney stones    Hyperlipidemia    Hypertension    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[name])    Other testicular hypofunction    Severe sepsis ([name]) 08/26/2019   Type II or unspecified type diabetes mellitus without mention of complication, not stated as uncontrolled    Vitamin D deficiency      Allergies  Allergen  Reactions   Invokana [Canagliflozin] Other (See Comments)    Extremity edema/caused pain   Codeine Camsylate [Codeine] Rash   Morphine And Codeine Rash    Current Outpatient Medications on File Prior to Visit  Medication Sig   cetirizine (ZYRTEC) 10 MG tablet Take 10 mg by mouth daily as needed for allergies.   Cholecalciferol (VITAMIN D) 50 MCG (2000 UT) CAPS Take 2,000 Units by mouth 2 (two) times daily.   citalopram (CELEXA) 40 MG tablet TAKE 1 TABLET BY MOUTH ONCE DAILY FOR MOOD AND CHRONIC ANXIETY   clopidogrel (PLAVIX) 75 MG tablet Take  1 tablet  Daily  to Prevent Blood Clots & Strokes   Dulaglutide (TRULICITY) 3 MG/0.5ML SOPN Inject 3 mg as directed once a week. (Patient not taking: Reported on 07/31/2022)   hydrochlorothiazide (HYDRODIURIL) 25 MG tablet Take  1 tablet  Daily  for BP & Fluid Retention / Ankle Swelling   lisinopril (ZESTRIL) 10 MG tablet Take 1 tablet (10 mg total) by mouth daily.   metFORMIN (GLUCOPHAGE-XR) 500 MG 24 hr tablet Take 2 tablets 2 x /day with Meals for Diabetes.   pantoprazole (PROTONIX) 40 MG tablet Take 1 tablet (40 mg total) by mouth daily.   rosuvastatin (CRESTOR) 40 MG tablet Take one tablet daily for Cholesterol.   Study - OCEANIC-STROKE - asundexian 50 mg or placebo tablet (PI-Sethi) Take 1 tablet (50 mg total) by mouth daily. For investigational use only. Take 1 tablet by mouth once daily at the same time each day, preferably in the morning. Please bring bottle to every visit.   sulfamethoxazole-trimethoprim (BACTRIM DS) 800-160 MG tablet Take 1 tablet by mouth 2 (two) times daily.   verapamil (CALAN-SR) 240 MG CR tablet TAKE 1 TABLET BY MOUTH EVERY DAY FOR BLOOD PRESSURE AND HEART RHYTHM   No current facility-administered medications on file prior to visit.    ROS: all negative except above.   Physical Exam:  BP (!) 96/58   Pulse 69   Temp 97.7 F (36.5 C)   Ht 6\' 2"  (1.88 m)   Wt 250 lb 3.2 oz (113.5 kg)   SpO2 94%   BMI 32.12 kg/m    General Appearance: Well nourished, in no apparent distress. Eyes: PERRLA, EOMs, conjunctiva no swelling or erythema Sinuses: No Frontal/maxillary tenderness ENT/Mouth: Ext aud canals clear, TMs without erythema, bulging. No erythema, swelling, or exudate on post pharynx.  Tonsils not swollen or erythematous. Hearing normal.  Neck: Supple, thyroid normal.  Respiratory: Respiratory effort normal, BS equal bilaterally without rales, rhonchi, wheezing or stridor.  Cardio: RRR with no MRGs. Brisk peripheral pulses without edema.  Abdomen: Soft, + BS.  Non tender, no guarding, rebound, hernias, masses. Lymphatics: Non tender without lymphadenopathy.  Musculoskeletal: Full ROM, 5/5 strength, normal gait.  Skin: Warm, dry .He has 2 areas on his left lower shin- 1  area is from prosthesis crossing over his shin which is 1-2 cm open and red, no warmth or  drainage. 2nd skin tear on outer aspect of left lower calf approx 2-3 cm, superficial no warmth or drainage. Right stump intact with no skin breakdown noted  Neuro: Cranial nerves intact. Normal muscle tone, no cerebellar symptoms. Sensation intact.  Psych: Awake and oriented X 3, normal affect, Insight and Judgment appropriate.     Raynelle Dick, NP 4:06 PM [facility]

## 2022-07-31 NOTE — Patient Instructions (Signed)
Apply Mupirocin ointment to areas twice a day and leave open to the air.  If start to get drainage, redness or pain at site notify the office

## 2022-08-09 ENCOUNTER — Ambulatory Visit (INDEPENDENT_AMBULATORY_CARE_PROVIDER_SITE_OTHER): Payer: 59 | Admitting: Family

## 2022-08-09 DIAGNOSIS — Z89511 Acquired absence of right leg below knee: Secondary | ICD-10-CM | POA: Diagnosis not present

## 2022-08-09 NOTE — Progress Notes (Signed)
Office Visit Note   Patient: [name]           Date of Birth: 1960/08/30           MRN: [id number] Visit Date: 08/09/2022              Requested by: Lucky Cowboy, MD [address] [place],  [place] [address] PCP: Lucky Cowboy, MD  Chief Complaint  Patient presents with   Right Leg - Follow-up    HX [name] 09/01/2019       HPI: The patient is a 62 year old gentleman seen status post remote right below knee amputation. His current liner is worn out and broken down. Getting a poor seal and causing him pain. Would like an order for a new liner and supplies  Patient is an existing right transtibial  amputee.  Patient's current comorbidities are not expected to impact the ability to function with the prescribed prosthesis. Patient verbally communicates a strong desire to use a prosthesis. Patient currently requires mobility aids to ambulate without a prosthesis.  Expects not to use mobility aids with a new prosthesis.      Assessment & Plan: Visit Diagnoses: No diagnosis found.  Plan: Given an order for prosthesis supplies.  He will follow-up as needed.  Follow-Up Instructions: No follow-ups on file.   Ortho Exam  Patient is alert, oriented, no adenopathy, well-dressed, normal affect, normal respiratory effort. On examination of right residual limb there is no open area. No edema. There is 5 mm in diameter hyper keratotic tissue. Well consolidated.  Imaging: No results found. No images are attached to the encounter.  Labs: Lab Results  Component Value Date   HGBA1C 5.8 (H) 05/29/2022   HGBA1C 5.5 02/26/2022   HGBA1C 5.6 11/21/2021   LABURIC 6.0 05/09/2021   REPTSTATUS 09/06/2019 FINAL 09/01/2019   GRAMSTAIN  09/01/2019    MODERATE WBC PRESENT, PREDOMINANTLY PMN NO ORGANISMS SEEN    CULT  09/01/2019    No growth aerobically or anaerobically. Performed at [facility], [address] [place], [place] [address]     [facility] STREPTOCOCCUS ANGINOSIS 08/20/2019     Lab Results  Component Value Date   ALBUMIN 3.8 05/07/2021   ALBUMIN 2.0 (L) 09/06/2019   ALBUMIN 2.0 (L) 08/26/2019    Lab Results  Component Value Date   MG 2.0 07/19/2021   MG 2.2 01/18/2021   MG 2.2 07/19/2020   Lab Results  Component Value Date   VD25OH 74 07/19/2021   VD25OH 67 01/18/2021   VD25OH 81 07/19/2020    No results found for: "PREALBUMIN"    Latest Ref Rng & Units 05/29/2022    4:27 PM 02/26/2022    3:59 PM 11/21/2021    4:04 PM  CBC EXTENDED  WBC 3.8 - 10.8 Thousand/uL 13.0  10.7  13.2   RBC 4.20 - 5.80 Million/uL 4.39  4.27  4.24   Hemoglobin 13.2 - 17.1 g/dL 73.2  20.2  54.2   HCT 38.5 - 50.0 % 41.5  40.5  39.7   Platelets 140 - 400 Thousand/uL 225  197  224   NEUT# 1,500 - 7,800 cells/uL 7,332  5,692  7,828   Lymph# 850 - 3,900 cells/uL 4,173  3,510  4,052      There is no height or weight on file to calculate BMI.  Orders:  No orders of the defined types were placed in this encounter.  No orders of the defined  types were placed in this encounter.    Procedures: No procedures performed  Clinical Data: No additional findings.  ROS:  All other systems negative, except as noted in the HPI. Review of Systems  Objective: Vital Signs: There were no vitals taken for this visit.  Specialty Comments:  No specialty comments available.  PMFS History: Patient Active Problem List   Diagnosis Date Noted   Cerebral thrombosis with cerebral infarction 05/09/2021   Aphasia 05/08/2021   Leukocytosis 05/08/2021   Right below-knee amputee ([name]) 09/03/2019   Hyponatremia 08/26/2019   Cutaneous abscess of right foot    Poorly controlled diabetes mellitus ([name]) 07/16/2019   FHx: heart disease 10/08/2017   Type 2 diabetes mellitus with stage 2 chronic kidney disease, without long-term current use of insulin ([name]) 10/08/2017   Insomnia 07/08/2017   Smoker 08/17/2015   COPD (chronic obstructive  pulmonary disease) ([name]) 08/17/2015   T2_NIDDM w/Peripheral Neuropathy 04/01/2014   CKD stage 2 due to type 2 diabetes mellitus ([name]) 08/31/2013   Medication management 05/19/2013   Morbid obesity (BMI 35) 05/19/2013   Essential hypertension    Hyperlipidemia associated with type 2 diabetes mellitus ([name])    Vitamin D deficiency    History of kidney stones    Testosterone deficiency    History of hepatitis C    Past Medical History:  Diagnosis Date   Diabetic neuropathy ([name])    Diverticulitis    History of hepatitis C    has been treated in the past   History of kidney stones    Hyperlipidemia    Hypertension    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[name])    Other testicular hypofunction    Severe sepsis ([name]) 08/26/2019   Type II or unspecified type diabetes mellitus without mention of complication, not stated as uncontrolled    Vitamin D deficiency     Family History  Problem Relation Age of Onset   Hypertension Mother    Cancer Father        colon   Alzheimer's disease Father    Stroke Father     Past Surgical History:  Procedure Laterality Date   AMPUTATION Right 09/01/2019   Procedure: RIGHT BELOW KNEE AMPUTATION;  Surgeon: Nadara Mustard, MD;  Location: [facility] OR;  Service: Orthopedics;  Laterality: Right;   CHOLECYSTECTOMY  1987   COLONOSCOPY     I & D EXTREMITY Right 08/20/2019   Procedure: IRRIGATION & DEBRIDEMENT RIGHT FOOT PARTIAL CALCANEAL EXCISION;  Surgeon: Nadara Mustard, MD;  Location: [facility] OR;  Service: Orthopedics;  Laterality: Right;   I & D EXTREMITY Right 08/27/2019   Procedure: IRRIGATION AND DEBRIDEMENT  OF FOOT;  Surgeon: Tarry Kos, MD;  Location: [facility] OR;  Service: Orthopedics;  Laterality: Right;   ORIF TIBIA FRACTURE     x 3 right leg   Social History   Occupational History   Not on file  Tobacco Use   Smoking status: Former    Current packs/day: 0.00    Types: Cigarettes    Quit date: 05/07/2021    Years since quitting: 1.2   Smokeless tobacco: Never   Vaping Use   Vaping status: Never Used  Substance and Sexual Activity   Alcohol use: No   Drug use: Yes    Types: Marijuana   Sexual activity: Not on file

## 2022-08-10 ENCOUNTER — Encounter: Payer: Self-pay | Admitting: Family

## 2022-08-23 DIAGNOSIS — Z89511 Acquired absence of right leg below knee: Secondary | ICD-10-CM | POA: Diagnosis not present

## 2022-08-26 ENCOUNTER — Other Ambulatory Visit: Payer: Self-pay | Admitting: Nurse Practitioner

## 2022-08-26 DIAGNOSIS — F419 Anxiety disorder, unspecified: Secondary | ICD-10-CM

## 2022-09-04 ENCOUNTER — Encounter: Payer: BC Managed Care – PPO | Admitting: Internal Medicine

## 2022-09-09 NOTE — Progress Notes (Signed)
Annual  Screening/Preventative Visit  & Comprehensive Evaluation & Examination   Future Appointments  Date Time Provider Department  09/10/2022                     cpe  2:00 PM Lucky Cowboy, MD [facility]  09/17/2023                     cpe  2:00 PM Lucky Cowboy, MD [facility]             This very nice 62 y.o. MWM  with HTN, HLD, T2_NIDDM, Rt BKA   and Vitamin D Deficiency  presents for a Screening /Preventative Visit & comprehensive evaluation and management of multiple medical co-morbidities.                                                     Due to his long history of smoking over 45 pk years,  I discussed lung cancer screening with him . He was agreeable to undergo a screening low dose CT scan of the chest.  We discussed smoking cessation techniques/options. I will refer him  for a LDCT lung scan &  lung cancer screening program        HTN predates since 1990.  Patient's BP has been controlled at home.  Today's BP is  at goal  - 132/66 . Patient denies any cardiac symptoms as chest pain, palpitations, shortness of breath, dizziness or ankle swelling.        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 Patient has been out of work since that time .         Patient's hyperlipidemia is controlled with diet and medications. Patient denies myalgias or other medication SE's. Last lipids were at goal except elevated Trig's:  Lab Results  Component Value Date   CHOL 111 05/29/2022   HDL 32 (L) 05/29/2022   LDLCALC 50 05/29/2022   TRIG 231 (H) 05/29/2022   CHOLHDL 3.5 05/29/2022                                           Patient has hx/o Low Testosterone Deficiency  for several years & he has declined replacement therapy.      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  In  August 2021, patient underwent a Rt BKA & now with prosthesis. Last A1c was not at goal:   Lab Results  Component Value Date   HGBA1C 6.2 (H) 07/19/2020        Finally, patient has history of Vitamin D Deficiency ("24" /2008) and last vitamin D was at goal:   Lab Results  Component Value Date   VD25OH 74 07/19/2021     Current Outpatient Medications  Medication Instructions   cetirizine  10 mg, Oral, Daily PRN   citalopram  40 MG tablet TAKE 1 TABLET DAILY FOR MOOD AND CHRONIC ANXIETY   clopidogrel 75 MG tablet Take  1 tablet  Daily  t   hydrochlorothiazide  25 MG  tablet Take  1 tablet  Daily     lisinopril 10 mg Daily   metFORMIN-XR) 500 MG  Take 2 tablets 2 x /day with Meals for Diabetes.   mupirocin cream 2 % 1 Application, Topical, 2 times daily   OCEANIC-STROKE asundexian or placebo 50 mg, Oral, Daily, For investigational use only. Take 1 tablet by mouth once daily at the same time each day, preferably in the morning. Please bring bottle to every visit.   pantoprazole 40 mg Daily   rosuvastatin  40 MG tablet Take one tablet daily   Trulicity3 mg  Injection, Weekly   verapamil -SR 240 MG  TAKE 1 TABLET  EVERY DAY   Vitamin D    2,000 Units  2 times daily     Allergies  Allergen Reactions   Invokamet  Extremity edema/caused pain   Invokana [Canagliflozin] Extremity edema/caused pain   Codeine  Rash   Morphine And Related Rash     Past Medical History:  Diagnosis Date   Diabetic neuropathy ([name])    Diverticulitis    History of hepatitis C    has been treated in the past   History of kidney stones    Hyperlipidemia    Hypertension    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[name])    Other testicular hypofunction    Severe sepsis ([name]) 08/26/2019   Type II  diabetes mellitus     Vitamin D deficiency      Health Maintenance  Topic Date Due   PNEUMOCOCCAL VACCINE  Never done   COVID-19 Vaccine (1) Never done   Pneumococcal Vaccine  Never done   OPHTHALMOLOGY EXAM  Never  done   Zoster Vaccines- Shingrix (1 of 2) Never done   COLONOSCOPY  Never done   INFLUENZA VACCINE  08/21/2020   HEMOGLOBIN A1C  01/18/2021   FOOT EXAM  07/19/2021   TETANUS/TDAP  12/26/2026   Hepatitis C Screening  Completed   HIV Screening  Completed   HPV VACCINES  Aged Out     Immunization History  Administered Date(s) Administered   PPD Test 04/03/2017, 07/14/2018, 07/19/2019   Td 01/21/2005   Tdap 12/25/2016    Last Colon - 2007 - Dr Neomia Glass - overdue 10 yr f/u (Patient aware overdue)     Past Surgical History:  Procedure Laterality Date   AMPUTATION Right 09/01/2019   Procedure: RIGHT BELOW KNEE AMPUTATION;  Surgeon: Nadara Mustard, MD;  Location: [facility] OR;  Service: Orthopedics;  Laterality: Right;   CHOLECYSTECTOMY  1987   COLONOSCOPY     I & D EXTREMITY Right 08/20/2019   Procedure: IRRIGATION & DEBRIDEMENT RIGHT FOOT PARTIAL CALCANEAL EXCISION;  Surgeon: Nadara Mustard, MD;  Location: [facility] OR;  Service: Orthopedics;  Laterality: Right;   I & D EXTREMITY Right 08/27/2019   Procedure: IRRIGATION AND DEBRIDEMENT  OF FOOT;  Surgeon: Tarry Kos, MD;  Location: [facility] OR;  Service: Orthopedics;  Laterality: Right;   ORIF TIBIA FRACTURE     x 3 right leg     Family History  Problem Relation Age of Onset   Hypertension Mother    Cancer Father        colon   Alzheimer's disease Father    Stroke Father     Social History   Socioeconomic History   Marital status: Married    Spouse name: Not on file   Number of children: Not on file  Occupational History   Not on file  Tobacco  Use   Smoking status: Every Day    Packs/day: 1.00    Pack years: 0.00    Types: Cigarettes   Smokeless tobacco: Never  Substance and Sexual Activity   Alcohol use: No   Drug use: Yes    Types: Marijuana   Sexual activity: Not on file      ROS  Constitutional: Denies fever, chills, weight loss/gain, headaches, insomnia,  night sweats or change in appetite. Does c/o fatigue. Eyes:  Denies redness, blurred vision, diplopia, discharge, itchy or watery eyes.  ENT: Denies discharge, congestion, post nasal drip, epistaxis, sore throat, earache, hearing loss, dental pain, Tinnitus, Vertigo, Sinus pain or snoring.  Cardio: Denies chest pain, palpitations, irregular heartbeat, syncope, dyspnea, diaphoresis, orthopnea, PND, claudication or edema Respiratory: denies cough, dyspnea, DOE, pleurisy, hoarseness, laryngitis or wheezing.  Gastrointestinal: Denies dysphagia, heartburn, reflux, water brash, pain, cramps, nausea, vomiting, bloating, diarrhea, constipation, hematemesis, melena, hematochezia, jaundice or hemorrhoids Genitourinary: Denies dysuria, frequency, urgency, nocturia, hesitancy, discharge, hematuria or flank pain Musculoskeletal: Denies arthralgia, myalgia, stiffness, Jt. Swelling, pain, limp or strain/sprain. Denies Falls. Skin: Denies puritis, rash, hives, warts, acne, eczema or change in skin lesion Neuro: No weakness, tremor, incoordination, spasms, paresthesia or pain Psychiatric: Denies confusion, memory loss or sensory loss. Denies Depression. Endocrine: Denies change in weight, skin, hair change, nocturia, and paresthesia, diabetic polys, visual blurring or hyper / hypo glycemic episodes.  Heme/Lymph: No excessive bleeding, bruising or enlarged lymph nodes.   Physical Exam  BP 132/66   Pulse 72   Temp 97.9 F (36.6 C)   Resp 16   Ht 6\' 2"  (1.88 m)   Wt 252 lb (114.3 kg)   SpO2 96%   BMI 32.35 kg/m   General Appearance: Over nourished and well groomed and in no apparent distress.  Eyes: PERRLA, EOMs, conjunctiva no swelling or erythema, normal fundi and vessels. Sinuses: No frontal/maxillary tenderness ENT/Mouth: EACs patent / TMs  nl. Nares clear without erythema, swelling, mucoid exudates. Oral hygiene is good. No erythema, swelling, or exudate. Tongue normal, non-obstructing. Tonsils not swollen or erythematous. Hearing normal.  Neck: Supple,  thyroid not palpable. No bruits, nodes or JVD. Respiratory: Respiratory effort normal.  BS equal and clear bilateral without rales, rhonci, wheezing or stridor. Cardio: Heart sounds are normal with regular rate and rhythm and no murmurs, rubs or gallops. Peripheral pulses are normal and equal bilaterally without edema. No aortic or femoral bruits. Chest: symmetric with normal excursions and percussion.  Abdomen: Soft, with Nl bowel sounds. Nontender, no guarding, rebound, hernias, masses, or organomegaly.  Lymphatics: Non tender without lymphadenopathy.  Musculoskeletal: [name].   Skin: Warm and dry without rashes, lesions, cyanosis, clubbing or  ecchymosis.  Neuro: Cranial nerves intact, reflexes equal bilaterally. Normal muscle tone, no cerebellar symptoms. Sensation decreased to touch, vibratory and Monofilament to the lt. toes.  Pysch: Alert and oriented X 3 with normal affect, insight and judgment appropriate.   Assessment and Plan  1. Annual Preventative/Screening Exam    2. Essential hypertension  - EKG 12-Lead - [place], RETROPERITNL ABD,  LTD - Urinalysis, Routine w reflex microscopic - Microalbumin / creatinine urine ratio - CBC with Differential/Platelet - COMPLETE METABOLIC PANEL WITH GFR - Magnesium - TSH   3. Hyperlipidemia associated with type 2 diabetes mellitus ([name])  - EKG 12-Lead - [place], RETROPERITNL ABD,  LTD - Lipid panel   4. Type 2 diabetes mellitus with stage 2 chronic kidney  disease, without long-term current use of insulin ([name])  - EKG 12-Lead - [place], RETROPERITNL ABD,  LTD - Hemoglobin A1c - Insulin, random   5. T2_NIDDM w/Peripheral Neuropathy  - [place], RETROPERITNL ABD,  LTD - HM DIABETES FOOT EXAM - PR LOW EXTEMITY NEUR EXAM DOCUM  - Hemoglobin A1c - Insulin, random   6. Vitamin D deficiency  - VITAMIN D 25 Hydroxyl   7. Right below-knee amputee ([name])   8. Screening for lung cancer  - CT CHEST LUNG CANCER  SCREENING                                                   LOW DOSE WO CONTRAST; Future   9. Screening-pulmonary TB  - TB Skin Test   10. Screening for colorectal cancer  - Cologuard   11. Prostate cancer screening  - PSA   12. Testosterone deficiency  - Testosterone   13. Screening for heart disease  - EKG 12-Lead   14. FHx: heart disease  - EKG 12-Lead - [place], RETROPERITNL ABD,  LTD   15. Smoker  - EKG 12-Lead - CT CHEST LUNG CANCER SCREENING                                           LOW DOSE WO CONTRAST; Future   16. Screening for AAA (aortic abdominal aneurysm)  - [place], RETROPERITNL ABD,  LTD   17. Fatigue  - Iron, Total/Total Iron Binding Cap - Vitamin B12 - Testosterone - CBC with Differential/Platelet - TSH   18. Medication management  - Urinalysis, Routine w reflex microscopic - Microalbumin / creatinine urine ratio - CBC with Differential/Platelet - COMPLETE METABOLIC PANEL WITH GFR - Magnesium - Lipid panel - TSH - Hemoglobin A1c - Insulin, random - VITAMIN D 25 Hydroxyl           Patient was counseled in prudent diet, weight control to achieve/maintain BMI less than 25, BP monitoring, regular exercise and medications as discussed.  Discussed med effects and SE's. Routine screening labs and tests as requested with regular follow-up as recommended. Over 40 minutes of exam, counseling, chart review and high complex critical decision making was performed   Marinus Maw, MD

## 2022-09-09 NOTE — Progress Notes (Incomplete)
Annual  Screening/Preventative Visit  & Comprehensive Evaluation & Examination   Future Appointments  Date Time Provider Department  09/10/2022                     cpe  2:00 PM Lucky Cowboy, MD [facility]  09/17/2023                     cpe  2:00 PM Lucky Cowboy, MD [facility]             This very nice 62 y.o. MWM  with HTN, HLD, T2_NIDDM  and Vitamin D Deficiency  presents for a Screening /Preventative Visit & comprehensive evaluation and management of multiple medical co-morbidities.         HTN predates since 35.  Patient's BP has been controlled at home.  Today's BP is  at goal  - 119/60 . Patient denies any cardiac symptoms as chest pain, palpitations, shortness of breath, dizziness or ankle swelling.        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 Patient has been out of work during that time .         Patient's hyperlipidemia is controlled with diet and medications. Patient denies myalgias or other medication SE's. Last lipids were at goal except elevated Trig's:  Lab Results  Component Value Date   CHOL 111 05/29/2022   HDL 32 (L) 05/29/2022   LDLCALC 50 05/29/2022   TRIG 231 (H) 05/29/2022   CHOLHDL 3.5 05/29/2022                                           Patient has hx/o Low Testosterone Deficiency  for several years & he has declined replacement therapy.      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 Last A1c was not at goal:   Lab Results  Component Value Date   HGBA1C 6.2 (H) 07/19/2020        Finally, patient has history of Vitamin D Deficiency ("24" /2008) and last vitamin D was at goal:   Lab Results  Component Value Date   VD25OH 74 07/19/2021     Current Outpatient  Medications  Medication Instructions  . cetirizine  10 mg, Oral, Daily PRN  . citalopram  40 MG tablet TAKE 1 TABLET DAILY FOR MOOD AND CHRONIC ANXIETY  . clopidogrel 75 MG tablet Take  1 tablet  Daily  t  . hydrochlorothiazide  25 MG tablet Take  1 tablet  Daily    . lisinopril 10 mg Daily  . metFORMIN-XR) 500 MG  Take 2 tablets 2 x /day with Meals for Diabetes.  . mupirocin cream 2 % 1 Application, Topical, 2 times daily  . OCEANIC-STROKE asundexian or placebo 50 mg, Oral, Daily, For investigational use only. Take 1 tablet by mouth once daily at the same time each day, preferably in the morning. Please bring bottle to every visit.  . pantoprazole  40 mg, Oral, Daily  . rosuvastatin  40 MG tablet Take one tablet daily  . Trulicity 3 mg Injection, Weekly  . verapamil -SR  240 MG  TAKE 1 TABLET  EVERY DAY  . Vitamin D    2,000 Units  2 times daily     Allergies  Allergen Reactions  . Invokamet  Extremity edema/caused pain  . Invokana [Canagliflozin] Extremity edema/caused pain  . Codeine  Rash  . Morphine And Related Rash     Past Medical History:  Diagnosis Date  . Diabetic neuropathy ([name])   . Diverticulitis   . History of hepatitis C    has been treated in the past  . History of kidney stones   . Hyperlipidemia   . Hypertension   . 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[name])   . Other testicular hypofunction   . Severe sepsis ([name]) 08/26/2019  . Type II  diabetes mellitus    . Vitamin D deficiency      Health Maintenance  Topic Date Due  . PNEUMOCOCCAL VACCINE  Never done  . COVID-19 Vaccine (1) Never done  . Pneumococcal Vaccine  Never done  . OPHTHALMOLOGY EXAM  Never done  . Zoster Vaccines- Shingrix (1 of 2) Never done  . COLONOSCOPY  Never done  . INFLUENZA VACCINE  08/21/2020  . HEMOGLOBIN A1C  01/18/2021  . FOOT EXAM  07/19/2021  . TETANUS/TDAP  12/26/2026  . Hepatitis C Screening  Completed  . HIV Screening  Completed  . HPV VACCINES  Aged Out      Immunization History  Administered Date(s) Administered  . PPD Test 04/03/2017, 07/14/2018, 07/19/2019  . Td 01/21/2005  . Tdap 12/25/2016    Last Colon - 2007 - Dr Neomia Glass - overdue 10 yr f/u (Patient aware overdue)     Past Surgical History:  Procedure Laterality Date  . AMPUTATION Right 09/01/2019   Procedure: RIGHT BELOW KNEE AMPUTATION;  Surgeon: Nadara Mustard, MD;  Location: [facility] OR;  Service: Orthopedics;  Laterality: Right;  . CHOLECYSTECTOMY  1987  . COLONOSCOPY    . I & D EXTREMITY Right 08/20/2019   Procedure: IRRIGATION & DEBRIDEMENT RIGHT FOOT PARTIAL CALCANEAL EXCISION;  Surgeon: Nadara Mustard, MD;  Location: [facility] OR;  Service: Orthopedics;  Laterality: Right;  . I & D EXTREMITY Right 08/27/2019   Procedure: IRRIGATION AND DEBRIDEMENT  OF FOOT;  Surgeon: Tarry Kos, MD;  Location: [facility] OR;  Service: Orthopedics;  Laterality: Right;  . ORIF TIBIA FRACTURE     x 3 right leg     Family History  Problem Relation Age of Onset  . Hypertension Mother   . Cancer Father        colon  . Alzheimer's disease Father   . Stroke Father     Social History   Socioeconomic History  . Marital status: Married    Spouse name: Not on file  . Number of children: Not on file  Occupational History  . Not on file  Tobacco Use  . Smoking status: Every Day    Packs/day: 1.00    Pack years: 0.00    Types: Cigarettes  . Smokeless tobacco: Never  Substance and Sexual Activity  . Alcohol use: No  . Drug use: Yes    Types: Marijuana  . Sexual activity: Not on file      ROS Constitutional: Denies fever, chills, weight loss/gain, headaches, insomnia,  night sweats or change in appetite. Does c/o fatigue. Eyes: Denies redness, blurred vision, diplopia, discharge, itchy or watery eyes.  ENT: Denies discharge, congestion, post nasal drip, epistaxis, sore throat, earache, hearing loss, dental pain, Tinnitus, Vertigo,  Sinus pain or snoring.  Cardio: Denies chest pain,  palpitations, irregular heartbeat, syncope, dyspnea, diaphoresis, orthopnea, PND, claudication or edema Respiratory: denies cough, dyspnea, DOE, pleurisy, hoarseness, laryngitis or wheezing.  Gastrointestinal: Denies dysphagia, heartburn, reflux, water brash, pain, cramps, nausea, vomiting, bloating, diarrhea, constipation, hematemesis, melena, hematochezia, jaundice or hemorrhoids Genitourinary: Denies dysuria, frequency, urgency, nocturia, hesitancy, discharge, hematuria or flank pain Musculoskeletal: Denies arthralgia, myalgia, stiffness, Jt. Swelling, pain, limp or strain/sprain. Denies Falls. Skin: Denies puritis, rash, hives, warts, acne, eczema or change in skin lesion Neuro: No weakness, tremor, incoordination, spasms, paresthesia or pain Psychiatric: Denies confusion, memory loss or sensory loss. Denies Depression. Endocrine: Denies change in weight, skin, hair change, nocturia, and paresthesia, diabetic polys, visual blurring or hyper / hypo glycemic episodes.  Heme/Lymph: No excessive bleeding, bruising or enlarged lymph nodes.   Physical Exam  There were no vitals taken for this visit.  General Appearance: Over nourished and well groomed and in no apparent distress.  Eyes: PERRLA, EOMs, conjunctiva no swelling or erythema, normal fundi and vessels. Sinuses: No frontal/maxillary tenderness ENT/Mouth: EACs patent / TMs  nl. Nares clear without erythema, swelling, mucoid exudates. Oral hygiene is good. No erythema, swelling, or exudate. Tongue normal, non-obstructing. Tonsils not swollen or erythematous. Hearing normal.  Neck: Supple, thyroid not palpable. No bruits, nodes or JVD. Respiratory: Respiratory effort normal.  BS equal and clear bilateral without rales, rhonci, wheezing or stridor. Cardio: Heart sounds are normal with regular rate and rhythm and no murmurs, rubs or gallops. Peripheral pulses are normal and equal bilaterally without edema. No aortic or femoral  bruits. Chest: symmetric with normal excursions and percussion.  Abdomen: Soft, with Nl bowel sounds. Nontender, no guarding, rebound, hernias, masses, or organomegaly.  Lymphatics: Non tender without lymphadenopathy.  Musculoskeletal: [name].   Skin: Warm and dry without rashes, lesions, cyanosis, clubbing or  ecchymosis.  Neuro: Cranial nerves intact, reflexes equal bilaterally. Normal muscle tone, no cerebellar symptoms. Sensation decreased to touch, vibratory and Monofilament to the lt. toes.  Pysch: Alert and oriented X 3 with normal affect, insight and judgment appropriate.   Assessment and Plan  1. Annual Preventative/Screening Exam    2. Essential hypertension  - EKG 12-Lead - [place], RETROPERITNL ABD,  LTD - Urinalysis, Routine w reflex microscopic - Microalbumin / creatinine urine ratio - CBC with Differential/Platelet - COMPLETE METABOLIC PANEL WITH GFR - Magnesium - TSH  3. Hyperlipidemia associated with type 2 diabetes mellitus ([name])  - EKG 12-Lead - [place], RETROPERITNL ABD,  LTD - Lipid panel - TSH  4. 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 T2_NIDDM w/Peripheral Neuropathy  - HM DIABETES FOOT EXAM - Hemoglobin A1c - Insulin, random - Dulaglutide (TRULICITY) 1.5 MG/0.5ML;  Inject 1.5 mg into skin each week   Dispense: 2 mL; Refill: 0  6. Vitamin D deficiency  - VITAMIN D 25 Hydroxy   7. Right below-knee amputee ([name])   8. Screening for colorectal cancer  - POC Hemoccult Bld/Stl   9. Screening examination for pulmonary tuberculosis  - TB Skin Test  10. Prostate cancer screening  - PSA  11. Testosterone deficiency  - Testosterone  12. Screening for heart disease  - EKG 12-Lead  13. FHx: heart disease  - EKG 12-Lead -  [place], RETROPERITNL ABD,  LTD  14. Smoker  - EKG 12-Lead - [place], RETROPERITNL ABD,  LTD  15. Fatigue, unspecified type  - Iron, Total/Total Iron Binding Cap - Vitamin B12 - Testosterone - CBC with Differential/Platelet - TSH  16. Medication management  - Urinalysis, Routine w reflex microscopic - Microalbumin / creatinine urine ratio - CBC with Differential/Platelet - COMPLETE METABOLIC PANEL WITH GFR - Magnesium - Lipid panel - TSH - Hemoglobin A1c - Insulin, random - VITAMIN D 25 Hydroxy          Patient was counseled in prudent diet, weight control to achieve/maintain BMI less than 25, BP monitoring, regular exercise and medications as discussed.  Discussed med effects and SE's. Routine screening labs and tests as requested with regular follow-up as recommended. Over 40 minutes of exam, counseling, chart review and high complex critical decision making was performed   Marinus Maw, MD

## 2022-09-10 ENCOUNTER — Ambulatory Visit (INDEPENDENT_AMBULATORY_CARE_PROVIDER_SITE_OTHER): Payer: 59 | Admitting: Internal Medicine

## 2022-09-10 ENCOUNTER — Encounter: Payer: Self-pay | Admitting: Internal Medicine

## 2022-09-10 VITALS — BP 132/66 | HR 72 | Temp 97.9°F | Resp 16 | Ht 74.0 in | Wt 252.0 lb

## 2022-09-10 DIAGNOSIS — Z1211 Encounter for screening for malignant neoplasm of colon: Secondary | ICD-10-CM

## 2022-09-10 DIAGNOSIS — E349 Endocrine disorder, unspecified: Secondary | ICD-10-CM

## 2022-09-10 DIAGNOSIS — E559 Vitamin D deficiency, unspecified: Secondary | ICD-10-CM | POA: Diagnosis not present

## 2022-09-10 DIAGNOSIS — I10 Essential (primary) hypertension: Secondary | ICD-10-CM

## 2022-09-10 DIAGNOSIS — E1122 Type 2 diabetes mellitus with diabetic chronic kidney disease: Secondary | ICD-10-CM

## 2022-09-10 DIAGNOSIS — E1140 Type 2 diabetes mellitus with diabetic neuropathy, unspecified: Secondary | ICD-10-CM | POA: Diagnosis not present

## 2022-09-10 DIAGNOSIS — Z125 Encounter for screening for malignant neoplasm of prostate: Secondary | ICD-10-CM | POA: Diagnosis not present

## 2022-09-10 DIAGNOSIS — R5383 Other fatigue: Secondary | ICD-10-CM

## 2022-09-10 DIAGNOSIS — I700 Atherosclerosis of aorta: Secondary | ICD-10-CM

## 2022-09-10 DIAGNOSIS — Z111 Encounter for screening for respiratory tuberculosis: Secondary | ICD-10-CM

## 2022-09-10 DIAGNOSIS — Z8249 Family history of ischemic heart disease and other diseases of the circulatory system: Secondary | ICD-10-CM

## 2022-09-10 DIAGNOSIS — N182 Chronic kidney disease, stage 2 (mild): Secondary | ICD-10-CM

## 2022-09-10 DIAGNOSIS — Z79899 Other long term (current) drug therapy: Secondary | ICD-10-CM

## 2022-09-10 DIAGNOSIS — E1169 Type 2 diabetes mellitus with other specified complication: Secondary | ICD-10-CM

## 2022-09-10 DIAGNOSIS — Z136 Encounter for screening for cardiovascular disorders: Secondary | ICD-10-CM

## 2022-09-10 DIAGNOSIS — E785 Hyperlipidemia, unspecified: Secondary | ICD-10-CM | POA: Diagnosis not present

## 2022-09-10 DIAGNOSIS — Z0000 Encounter for general adult medical examination without abnormal findings: Secondary | ICD-10-CM

## 2022-09-10 DIAGNOSIS — Z89511 Acquired absence of right leg below knee: Secondary | ICD-10-CM

## 2022-09-10 DIAGNOSIS — Z122 Encounter for screening for malignant neoplasm of respiratory organs: Secondary | ICD-10-CM

## 2022-09-10 DIAGNOSIS — E1149 Type 2 diabetes mellitus with other diabetic neurological complication: Secondary | ICD-10-CM | POA: Diagnosis not present

## 2022-09-10 DIAGNOSIS — Z0001 Encounter for general adult medical examination with abnormal findings: Secondary | ICD-10-CM

## 2022-09-10 DIAGNOSIS — F17200 Nicotine dependence, unspecified, uncomplicated: Secondary | ICD-10-CM

## 2022-09-10 NOTE — Patient Instructions (Signed)
Due to recent changes in healthcare laws, you may see the results of your imaging and laboratory studies on MyChart before your provider has had a chance to review them.  We understand that in some cases there may be results that are confusing or concerning to you. Not all laboratory results come back in the same time frame and the provider may be waiting for multiple results in order to interpret others.  Please give us 48 hours in order for your provider to thoroughly review all the results before contacting the office for clarification of your results.  ° °++++++++++++++++++++++++++++++++++++++ ° Vit D  & °Vit C 1,000 mg   °are recommended to help protect  °against the Covid-19 and other Corona viruses.  ° ° Also it's recommended  °to take  °Zinc 50 mg  °to help  °protect against the Covid-19   °and best place to get ° is also on [employer]  °and don't pay more than 6-8 cents /pill !  °=============================== °Coronavirus (COVID-19) Are you at risk? ° °Are you at risk for the Coronavirus (COVID-19)? ° °To be considered HIGH RISK for Coronavirus (COVID-19), you have to meet the following criteria: ° °Traveled to [place], [place], [place], [place] or [place]; or in the [place] to [place], [place], [place]  °or [place]; and have fever, cough, and shortness of breath within the last 2 weeks of travel OR °Been in close contact with a person diagnosed with COVID-19 within the last 2 weeks and have  °fever, cough,and shortness of breath ° °IF YOU DO NOT MEET THESE CRITERIA, YOU ARE CONSIDERED LOW RISK FOR COVID-19. ° °What to do if you are HIGH RISK for COVID-19? ° °If you are having a medical emergency, call 911. °Seek medical care right away. Before you go to a doctor’s office, urgent care or emergency department, ° call ahead and tell them about your recent travel, contact with someone diagnosed with COVID-19  ° and your symptoms.  °You should receive instructions from your physician’s office  regarding next steps of care.  °When you arrive at healthcare provider, tell the healthcare staff immediately you have returned from  °visiting [place], [place], [place], [place] or [place]; or traveled in the [place] to [place], [place],  °[place] or [place] in the last two weeks or you have been in close contact with a person diagnosed with  °COVID-19 in the last 2 weeks.   °Tell the health care staff about your symptoms: fever, cough and shortness of breath. °After you have been seen by a medical provider, you will be either: °Tested for (COVID-19) and discharged home on quarantine except to seek medical care if  °symptoms worsen, and asked to  °Stay home and avoid contact with others until you get your results (4-5 days)  °Avoid travel on public transportation if possible (such as bus, train, or airplane) or °Sent to the Emergency Department by EMS for evaluation, COVID-19 testing  and  °possible admission depending on your condition and test results. ° °What to do if you are LOW RISK for COVID-19? ° °Reduce your risk of any infection by using the same precautions used for avoiding the common cold or flu:  °Wash your hands often with soap and warm water for at least 20 seconds.  If soap and water are not readily available,  °use an alcohol-based hand sanitizer with at least 60% alcohol.  °If coughing or sneezing, cover your mouth and nose by coughing   or sneezing into the elbow areas of your shirt or coat, ° into a tissue or into your sleeve (not your hands). °Avoid shaking hands with others and consider head nods or verbal greetings only. °Avoid touching your eyes, nose, or mouth with unwashed hands.  °Avoid close contact with people who are sick. °Avoid places or events with large numbers of people in one location, like concerts or sporting events. °Carefully consider travel plans you have or are making. °If you are planning any travel outside or inside the [place], visit the CDC’s Travelers’ Health  webpage for the latest health notices. °If you have some symptoms but not all symptoms, continue to monitor at home and seek medical attention  °if your symptoms worsen. °If you are having a medical emergency, call 911. °>>>>>>>>>>>>>>>>>>>>>>>>>>>> °Preventive Care for Adults ° °A healthy lifestyle and preventive care can promote health and wellness. Preventive health guidelines for men include the following key practices: °A routine yearly physical is a good way to check with your health care provider about your health and preventative screening. It is a chance to share any concerns and updates on your health and to receive a thorough exam. °Visit your dentist for a routine exam and preventative care every 6 months. Brush your teeth twice a day and floss once a day. Good oral hygiene prevents tooth decay and gum disease. °The frequency of eye exams is based on your age, health, family medical history, use of contact lenses, and other factors. Follow your health care provider's recommendations for frequency of eye exams. °Eat a healthy diet. Foods such as vegetables, fruits, whole grains, low-fat dairy products, and lean protein foods contain the nutrients you need without too many calories. Decrease your intake of foods high in solid fats, added sugars, and salt. Eat the right amount of calories for you. Get information about a proper diet from your health care provider, if necessary. °Regular physical exercise is one of the most important things you can do for your health. Most adults should get at least 150 minutes of moderate-intensity exercise (any activity that increases your heart rate and causes you to sweat) each week. In addition, most adults need muscle-strengthening exercises on 2 or more days a week. °Maintain a healthy weight. The body mass index (BMI) is a screening tool to identify possible weight problems. It provides an estimate of body fat based on height and weight. Your health care provider can  find your BMI and can help you achieve or maintain a healthy weight. For adults 20 years and older: °A BMI below 18.5 is considered underweight. °A BMI of 18.5 to 24.9 is normal. °A BMI of 25 to 29.9 is considered overweight. °A BMI of 30 and above is considered obese. °Maintain normal blood lipids and cholesterol levels by exercising and minimizing your intake of saturated fat. Eat a balanced diet with plenty of fruit and vegetables. Blood tests for lipids and cholesterol should begin at age 20 and be repeated every 5 years. If your lipid or cholesterol levels are high, you are over 50, or you are at high risk for heart disease, you may need your cholesterol levels checked more frequently. Ongoing high lipid and cholesterol levels should be treated with medicines if diet and exercise are not working. °If you smoke, find out from your health care provider how to quit. If you do not use tobacco, do not start. °Lung cancer screening is recommended for adults aged 55-80 years who are at high risk for   developing lung cancer because of a history of smoking. A yearly low-dose CT scan of the lungs is recommended for people who have at least a 30-pack-year history of smoking and are a current smoker or have quit within the past 15 years. A pack year of smoking is smoking an average of 1 pack of cigarettes a day for 1 year (for example: 1 pack a day for 30 years or 2 packs a day for 15 years). Yearly screening should continue until the smoker has stopped smoking for at least 15 years. Yearly screening should be stopped for people who develop a health problem that would prevent them from having lung cancer treatment. °If you choose to drink alcohol, do not have more than 2 drinks per day. One drink is considered to be 12 ounces (355 mL) of beer, 5 ounces (148 mL) of wine, or 1.5 ounces (44 mL) of liquor. °Avoid use of street drugs. Do not share needles with anyone. Ask for help if you need support or instructions about  stopping the use of drugs. °High blood pressure causes heart disease and increases the risk of stroke. Your blood pressure should be checked at least every 1-2 years. Ongoing high blood pressure should be treated with medicines, if weight loss and exercise are not effective. °If you are 45-79 years old, ask your health care provider if you should take aspirin to prevent heart disease. °Diabetes screening involves taking a blood sample to check your fasting blood sugar level. This should be done once every 3 years, after age 45, if you are within normal weight and without risk factors for diabetes. Testing should be considered at a younger age or be carried out more frequently if you are overweight and have at least 1 risk factor for diabetes. °Colorectal cancer can be detected and often prevented. Most routine colorectal cancer screening begins at the age of 50 and continues through age 75. However, your health care provider may recommend screening at an earlier age if you have risk factors for colon cancer. On a yearly basis, your health care provider may provide home test kits to check for hidden blood in the stool. Use of a small camera at the end of a tube to directly examine the colon (sigmoidoscopy or colonoscopy) can detect the earliest forms of colorectal cancer. Talk to your health care provider about this at age 50, when routine screening begins. Direct exam of the colon should be repeated every 5-10 years through age 75, unless early forms of precancerous polyps or small growths are found. ° Talk with your health care provider about prostate cancer screening. °Testicular cancer screening isrecommended for adult males. Screening includes self-exam, a health care provider exam, and other screening tests. Consult with your health care provider about any symptoms you have or any concerns you have about testicular cancer. °Use sunscreen. Apply sunscreen liberally and repeatedly throughout the day. You should  seek shade when your shadow is shorter than you. Protect yourself by wearing long sleeves, pants, a wide-brimmed hat, and sunglasses year round, whenever you are outdoors. °Once a month, do a whole-body skin exam, using a mirror to look at the skin on your back. Tell your health care provider about new moles, moles that have irregular borders, moles that are larger than a pencil eraser, or moles that have changed in shape or color. °Stay current with required vaccines (immunizations). °Influenza vaccine. All adults should be immunized every year. °Tetanus, diphtheria, and acellular pertussis (Td, Tdap) vaccine. An   adult who has not previously received Tdap or who does not know his vaccine status should receive 1 dose of Tdap. This initial dose should be followed by tetanus and diphtheria toxoids (Td) booster doses every 10 years. Adults with an unknown or incomplete history of completing a 3-dose immunization series with Td-containing vaccines should begin or complete a primary immunization series including a Tdap dose. Adults should receive a Td booster every 10 years. °Varicella vaccine. An adult without evidence of immunity to varicella should receive 2 doses or a second dose if he has previously received 1 dose. °Human papillomavirus (HPV) vaccine. Males aged 13-21 years who have not received the vaccine previously should receive the 3-dose series. Males aged 22-26 years may be immunized. Immunization is recommended through the age of 26 years for any male who has sex with males and did not get any or all doses earlier. Immunization is recommended for any person with an immunocompromised condition through the age of 26 years if he did not get any or all doses earlier. During the 3-dose series, the second dose should be obtained 4-8 weeks after the first dose. The third dose should be obtained 24 weeks after the first dose and 16 weeks after the second dose. °Zoster vaccine. One dose is recommended for adults  aged 60 years or older unless certain conditions are present. ° °PREVNAR  - Pneumococcal 13-valent conjugate (PCV13) vaccine. When indicated, a person who is uncertain of his immunization history and has no record of immunization should receive the PCV13 vaccine. An adult aged 19 years or older who has certain medical conditions and has not been previously immunized should receive 1 dose of PCV13 vaccine. This PCV13 should be followed with a dose of pneumococcal polysaccharide (PPSV23) vaccine. The PPSV23 vaccine dose should be obtained at least 1 r more year(s) after the dose of PCV13 vaccine. An adult aged 19 years or older who has certain medical conditions and previously received 1 or more doses of PPSV23 vaccine should receive 1 dose of PCV13. The PCV13 vaccine dose should be obtained 1 or more years after the last PPSV23 vaccine dose. ° °PNEUMOVAX - Pneumococcal polysaccharide (PPSV23) vaccine. When PCV13 is also indicated, PCV13 should be obtained first. All adults aged 65 years and older should be immunized. An adult younger than age 65 years who has certain medical conditions should be immunized. Any person who resides in a nursing home or long-term care facility should be immunized. An adult smoker should be immunized. People with an immunocompromised condition and certain other conditions should receive both PCV13 and PPSV23 vaccines. People with human immunodeficiency virus (HIV) infection should be immunized as soon as possible after diagnosis. Immunization during chemotherapy or radiation therapy should be avoided. Routine use of PPSV23 vaccine is not recommended for American Indians, [place] Natives, or people younger than 65 years unless there are medical conditions that require PPSV23 vaccine. When indicated, people who have unknown immunization and have no record of immunization should receive PPSV23 vaccine. One-time revaccination 5 years after the first dose of PPSV23 is recommended for people  aged 19-64 years who have chronic kidney failure, nephrotic syndrome, asplenia, or immunocompromised conditions. People who received 1-2 doses of PPSV23 before age 65 years should receive another dose of PPSV23 vaccine at age 65 years or later if at least 5 years have passed since the previous dose. Doses of PPSV23 are not needed for people immunized with PPSV23 at or after age 65 years. ° °Hepatitis A vaccine.   Adults who wish to be protected from this disease, have certain high-risk conditions, work with hepatitis A-infected animals, work in hepatitis A research labs, or travel to or work in countries with a high rate of hepatitis A should be immunized. Adults who were previously unvaccinated and who anticipate close contact with an international adoptee during the first 60 days after arrival in the [place] from a country with a high rate of hepatitis A should be immunized. ° °Hepatitis B vaccine. Adults should be immunized if they wish to be protected from this disease, have certain high-risk conditions, may be exposed to blood or other infectious body fluids, are household contacts or sex partners of hepatitis B positive people, are clients or workers in certain care facilities, or travel to or work in countries with a high rate of hepatitis B. ° °Preventive Service / Frequency ° °Ages 40 to 64 °Blood pressure check. °Lipid and cholesterol check °Lung cancer screening. / Every year if you are aged 55-80 years and have a 30-pack-year history of smoking and currently smoke or have quit within the past 15 years. Yearly screening is stopped once you have quit smoking for at least 15 years or develop a health problem that would prevent you from having lung cancer treatment. °Fecal occult blood test (FOBT) of stool. / Every year beginning at age 50 and continuing until age 75. You may not have to do this test if you get a colonoscopy every 10 years. °Flexible sigmoidoscopy** or colonoscopy.** / Every 5 years for  a flexible sigmoidoscopy or every 10 years for a colonoscopy beginning at age 50 and continuing until age 75. °Screening for abdominal aortic aneurysm (AAA)  by ultrasound is recommended for people who have history of high blood pressure or who are current or former smokers. °+++++++++++ °Recommend Adult Low Dose Aspirin or  °coated  Aspirin 81 mg daily  °To reduce risk of Colon Cancer 40 %, ° Skin Cancer 26 % ,  °Malignant Melanoma 46% ° and  °Pancreatic cancer 60% °++++++++++++++++++++ °Vitamin D goal ° is between 70-100.  °Please make sure that you are taking your Vitamin D as directed.  °It is very important as a natural anti-inflammatory  °helping hair, skin, and nails, as well as reducing stroke and heart attack risk.  °It helps your bones and helps with mood. °It also decreases numerous cancer risks so please take it as directed.  °Low Vit D is associated with a 200-300% higher risk for CANCER  °and 200-300% higher risk for HEART   ATTACK  &  STROKE.   °...................................... °It is also associated with higher death rate at younger ages,  °autoimmune diseases like Rheumatoid arthritis, Lupus, Multiple Sclerosis.    °Also many other serious conditions, like depression, Alzheimer's °Dementia, infertility, muscle aches, fatigue, fibromyalgia - just to name a few. °+++++++++++++++++++++ °Recommend the book "The END of DIETING" by Dr Joel Fuhrman  °& the book "The END of DIABETES " by Dr Joel Fuhrman °At [employer] - get book & Audio CD's  °  Being diabetic has a  300% increased risk for heart attack, stroke, cancer, and alzheimer- type vascular dementia. It is very important that you work harder with diet by avoiding all foods that are Isabell. Avoid Demasi rice (brown & wild rice is OK), Karapetyan potatoes (sweetpotatoes in moderation is OK), Gibbard bread or wheat bread or anything made out of Hare flour like bagels, donuts, rolls, buns, biscuits, cakes, pastries, cookies, pizza crust, and pasta (made    from Branscome flour & egg whites) - vegetarian pasta or spinach or wheat pasta is OK. Multigrain breads like Arnold's or Pepperidge Farm, or multigrain sandwich thins or flatbreads.  Diet, exercise and weight loss can reverse and cure diabetes in the early stages.  Diet, exercise and weight loss is very important in the control and prevention of complications of diabetes which affects every system in your body, ie. Brain - dementia/stroke, eyes - glaucoma/blindness, heart - heart attack/heart failure, kidneys - dialysis, stomach - gastric paralysis, intestines - malabsorption, nerves - severe painful neuritis, circulation - gangrene & loss of a leg(s), and finally cancer and Alzheimers. ° °  I recommend avoid fried & greasy foods,  sweets/candy, Back rice (brown or wild rice or Quinoa is OK), Rowles potatoes (sweet potatoes are OK) - anything made from Sesay flour - bagels, doughnuts, rolls, buns, biscuits,Abalos and wheat breads, pizza crust and traditional pasta made of Resendes flour & egg Lappe(vegetarian pasta or spinach or wheat pasta is OK).  Multi-grain bread is OK - like multi-grain flat bread or sandwich thins. Avoid alcohol in excess. Exercise is also important. ° °  Eat all the vegetables you want - avoid meat, especially red meat and dairy - especially cheese.  Cheese is the most concentrated form of trans-fats which is the worst thing to clog up our arteries. Veggie cheese is OK which can be found in the fresh produce section at [employer] or [employer] or [employer] ° °++++++++++++++++++++++ °DASH Eating Plan ° °DASH stands for "Dietary Approaches to Stop Hypertension."  ° °The DASH eating plan is a healthy eating plan that has been shown to reduce high blood pressure (hypertension). Additional health benefits may include reducing the risk of type 2 diabetes mellitus, heart disease, and stroke. The DASH eating plan may also help with weight loss. °WHAT DO I NEED TO KNOW ABOUT THE DASH EATING PLAN? °For  the DASH eating plan, you will follow these general guidelines: °Choose foods with a percent daily value for sodium of less than 5% (as listed on the food label). °Use salt-free seasonings or herbs instead of table salt or sea salt. °Check with your health care provider or pharmacist before using salt substitutes. °Eat lower-sodium products, often labeled as "lower sodium" or "no salt added." °Eat fresh foods. °Eat more vegetables, fruits, and low-fat dairy products. °Choose whole grains. Look for the word "whole" as the first word in the ingredient list. °Choose fish  °Limit sweets, desserts, sugars, and sugary drinks. °Choose heart-healthy fats. °Eat veggie cheese  °Eat more home-cooked food and less restaurant, buffet, and fast food. °Limit fried foods. °Cook foods using methods other than frying. °Limit canned vegetables. If you do use them, rinse them well to decrease the sodium. °When eating at a restaurant, ask that your food be prepared with less salt, or no salt if possible. °                  °   WHAT FOODS CAN I EAT? °Read Dr Joel Fuhrman's books on The End of Dieting & The End of Diabetes ° °Grains °Whole grain or whole wheat bread. Brown rice. Whole grain or whole wheat pasta. Quinoa, bulgur, and whole grain cereals. Low-sodium cereals. Corn or whole wheat flour tortillas. Whole grain cornbread. Whole grain crackers. Low-sodium crackers. ° °Vegetables °Fresh or frozen vegetables (raw, steamed, roasted, or grilled). Low-sodium or reduced-sodium tomato and vegetable juices. Low-sodium or reduced-sodium tomato sauce and paste. Low-sodium or reduced-sodium canned vegetables.  ° °  Fruits °All fresh, canned (in natural juice), or frozen fruits. ° °Protein Products ° All fish and seafood.  Dried beans, peas, or lentils. Unsalted nuts and seeds. Unsalted canned beans. ° °Dairy °Low-fat dairy products, such as skim or 1% milk, 2% or reduced-fat cheeses, low-fat ricotta or cottage cheese, or plain low-fat yogurt.  Low-sodium or reduced-sodium cheeses. ° °Fats and Oils °Tub margarines without trans fats. Light or reduced-fat mayonnaise and salad dressings (reduced sodium). Avocado. Safflower, olive, or canola oils. Natural peanut or almond butter. ° °Other °Unsalted popcorn and pretzels. °The items listed above may not be a complete list of recommended foods or beverages. Contact your dietitian for more options. ° °+++++++++++++++++++ ° °WHAT FOODS ARE NOT RECOMMENDED? °Grains/ Traore flour or wheat flour °Casamento bread. Ezekiel pasta. Baxley rice. Refined cornbread. Bagels and croissants. Crackers that contain trans fat. ° °Vegetables ° °Creamed or fried vegetables. Vegetables in a . Regular canned vegetables. Regular canned tomato sauce and paste. Regular tomato and vegetable juices. ° °Fruits °Dried fruits. Canned fruit in light or heavy syrup. Fruit juice. ° °Meat and Other Protein Products °Meat in general - RED meat & Highfill meat.  Fatty cuts of meat. Ribs, chicken wings, all processed meats as bacon, sausage, bologna, salami, fatback, hot dogs, bratwurst and packaged luncheon meats. ° °Dairy °Whole or 2% milk, cream, half-and-half, and cream cheese. Whole-fat or sweetened yogurt. Full-fat cheeses or blue cheese. Non-dairy creamers and whipped toppings. Processed cheese, cheese spreads, or cheese curds. ° °Condiments °Onion and garlic salt, seasoned salt, table salt, and sea salt. Canned and packaged gravies. Worcestershire sauce. Tartar sauce. Barbecue sauce. Teriyaki sauce. Soy sauce, including reduced sodium. Steak sauce. Fish sauce. Oyster sauce. Cocktail sauce. Horseradish. Ketchup and mustard. Meat flavorings and tenderizers. Bouillon cubes. Hot sauce. Tabasco sauce. Marinades. Taco seasonings. Relishes. ° °Fats and Oils °Butter, stick margarine, lard, shortening and bacon fat. Coconut, palm kernel, or palm oils. Regular salad dressings. ° °Pickles and olives. Salted popcorn and pretzels. ° °The items listed above may not  be a complete list of foods and beverages to avoid. ° ° °

## 2022-09-11 ENCOUNTER — Other Ambulatory Visit: Payer: Self-pay | Admitting: Nurse Practitioner

## 2022-09-11 DIAGNOSIS — K219 Gastro-esophageal reflux disease without esophagitis: Secondary | ICD-10-CM

## 2022-09-11 LAB — CBC WITH DIFFERENTIAL/PLATELET
Absolute Monocytes: 1190 {cells}/uL — ABNORMAL HIGH (ref 200–950)
Basophils Absolute: 87 {cells}/uL (ref 0–200)
Basophils Relative: 0.7 %
Eosinophils Absolute: 87 {cells}/uL (ref 15–500)
Eosinophils Relative: 0.7 %
HCT: 42.4 % (ref 38.5–50.0)
Hemoglobin: 14.8 g/dL (ref 13.2–17.1)
Lymphs Abs: 3968 {cells}/uL — ABNORMAL HIGH (ref 850–3900)
MCH: 32 pg (ref 27.0–33.0)
MCHC: 34.9 g/dL (ref 32.0–36.0)
MCV: 91.8 fL (ref 80.0–100.0)
MPV: 11.8 fL (ref 7.5–12.5)
Monocytes Relative: 9.6 %
Neutro Abs: 7068 {cells}/uL (ref 1500–7800)
Neutrophils Relative %: 57 %
Platelets: 196 10*3/uL (ref 140–400)
RBC: 4.62 10*6/uL (ref 4.20–5.80)
RDW: 12.8 % (ref 11.0–15.0)
Total Lymphocyte: 32 %
WBC: 12.4 10*3/uL — ABNORMAL HIGH (ref 3.8–10.8)

## 2022-09-11 LAB — COMPLETE METABOLIC PANEL WITH GFR
AG Ratio: 1.5 (calc) (ref 1.0–2.5)
ALT: 28 U/L (ref 9–46)
AST: 20 U/L (ref 10–35)
Albumin: 4.2 g/dL (ref 3.6–5.1)
Alkaline phosphatase (APISO): 65 U/L (ref 35–144)
BUN/Creatinine Ratio: 28 (calc) — ABNORMAL HIGH (ref 6–22)
BUN: 27 mg/dL — ABNORMAL HIGH (ref 7–25)
CO2: 24 mmol/L (ref 20–32)
Calcium: 9.5 mg/dL (ref 8.6–10.3)
Chloride: 103 mmol/L (ref 98–110)
Creat: 0.95 mg/dL (ref 0.70–1.35)
Globulin: 2.8 g/dL (ref 1.9–3.7)
Glucose, Bld: 102 mg/dL — ABNORMAL HIGH (ref 65–99)
Potassium: 4.7 mmol/L (ref 3.5–5.3)
Sodium: 136 mmol/L (ref 135–146)
Total Bilirubin: 0.3 mg/dL (ref 0.2–1.2)
Total Protein: 7 g/dL (ref 6.1–8.1)
eGFR: 90 mL/min/{1.73_m2} (ref >=60)

## 2022-09-11 LAB — HEMOGLOBIN A1C
Hgb A1c MFr Bld: 6.5 %{Hb} — ABNORMAL HIGH (ref <5.7)
Mean Plasma Glucose: 140 mg/dL
eAG (mmol/L): 7.7 mmol/L

## 2022-09-11 LAB — MAGNESIUM: Magnesium: 1.9 mg/dL (ref 1.5–2.5)

## 2022-09-11 LAB — URINALYSIS, ROUTINE W REFLEX MICROSCOPIC
Bilirubin Urine: NEGATIVE
Glucose, UA: NEGATIVE
Hgb urine dipstick: NEGATIVE
Ketones, ur: NEGATIVE
Leukocytes,Ua: NEGATIVE
Nitrite: NEGATIVE
Protein, ur: NEGATIVE
Specific Gravity, Urine: 1.019 (ref 1.001–1.035)
pH: <=5 (ref 5.0–8.0)

## 2022-09-11 LAB — LIPID PANEL
Cholesterol: 118 mg/dL (ref <200)
HDL: 32 mg/dL — ABNORMAL LOW (ref >=40)
LDL Cholesterol (Calc): 54 mg/dL
Non-HDL Cholesterol (Calc): 86 mg/dL (ref <130)
Total CHOL/HDL Ratio: 3.7 (calc) (ref <5.0)
Triglycerides: 274 mg/dL — ABNORMAL HIGH (ref <150)

## 2022-09-11 LAB — TESTOSTERONE: Testosterone: 132 ng/dL — ABNORMAL LOW (ref 250–827)

## 2022-09-11 LAB — IRON, TOTAL/TOTAL IRON BINDING CAP
%SAT: 31 % (ref 20–48)
Iron: 93 ug/dL (ref 50–180)
TIBC: 299 ug/dL (ref 250–425)

## 2022-09-11 LAB — VITAMIN B12: Vitamin B-12: >2000 pg/mL — ABNORMAL HIGH (ref 200–1100)

## 2022-09-11 LAB — INSULIN, RANDOM: Insulin: 74.8 u[IU]/mL — ABNORMAL HIGH

## 2022-09-11 LAB — MICROALBUMIN / CREATININE URINE RATIO
Creatinine, Urine: 100 mg/dL (ref 20–320)
Microalb Creat Ratio: 8 mg/g{creat} (ref <30)
Microalb, Ur: 0.8 mg/dL

## 2022-09-11 LAB — PSA: PSA: 2.77 ng/mL (ref <=4.00)

## 2022-09-11 LAB — TSH: TSH: 2.72 m[IU]/L (ref 0.40–4.50)

## 2022-09-11 LAB — VITAMIN D 25 HYDROXY (VIT D DEFICIENCY, FRACTURES): Vit D, 25-Hydroxy: 66 ng/mL (ref 30–100)

## 2022-09-13 NOTE — Progress Notes (Signed)
^<^<^<^<^<^<^<^<^<^<^<^<^<^<^<^<^<^<^<^<^<^<^<^<^<^<^<^<^<^<^<^<^<^<^<^<^ ^>^>^>^>^>^>^>^>^>^>^>>^>^>^>^>^>^>^>^>^>^>^>^>^>^>^>^>^>^>^>^>^>^>^>^>^>  -  Test results slightly outside the reference range are not unusual. If there is anything important, I will review this with you,  otherwise it is considered normal test values.  If you have further questions,  please do not hesitate to contact me at the office or via My Chart.   ^<^<^<^<^<^<^<^<^<^<^<^<^<^<^<^<^<^<^<^<^<^<^<^<^<^<^<^<^<^<^<^<^<^<^<^<^ ^>^>^>^>^>^>^>^>^>^>^>^>^>^>^>^>^>^>^>^>^>^>^>^>^>^>^>^>^>^>^>^>^>^>^>^>^  -  Vitamin B12 is very high   -  So may reduce dose of Vitamin B12 supplement                         to take just 2 x  /week on Mon  /  Thurs ^<^<^<^<^<^<^<^<^<^<^<^<^<^<^<^<^<^<^<^<^<^<^<^<^<^<^<^<^<^<^<^<^<^<^<^<^ ^>^>^>^>^>^>^>^>^>^>^>^>^>^>^>^>^>^>^>^>^>^>^>^>^>^>^>^>^>^>^>^>^>^>^>^>^  -  Testosterone level is still low  - continue shots  ^<^<^<^<^<^<^<^<^<^<^<^<^<^<^<^<^<^<^<^<^<^<^<^<^<^<^<^<^<^<^<^<^<^<^<^<^ ^>^>^>^>^>^>^>^>^>^>^>^>^>^>^>^>^>^>^>^>^>^>^>^>^>^>^>^>^>^>^>^>^>^>^>^>^  -  Chol = 118  -  Excellent   - Very low risk for Heart Attack  / Stroke ^>^>^>^>^>^>^>^>^>^>^>^>^>^>^>^>^>^>^>^>^>^>^>^>^>^>^>^>^>^>^>^>^>^>^>^>^ ^>^>^>^>^>^>^>^>^>^>^>^>^>^>^>^>^>^>^>^>^>^>^>^>^>^>^>^>^>^>^>^>^>^>^>^<^ -  But Triglycerides ( =   274  ) or fats in blood are too high                 (   Ideal or  Goal is less than 150  !  )    - Recommend avoid fried & greasy foods,  sweets / candy,   - Avoid white rice  (brown or wild rice or Quinoa is OK),   - Avoid white potatoes  (sweet potatoes are OK)   - Avoid anything made from white flour  - bagels, doughnuts, rolls, buns, biscuits, white and   wheat breads, pizza crust and traditional  pasta made of white flour & egg white  - (vegetarian pasta or spinach or wheat pasta is OK).    - Multi-grain bread is OK - like multi-grain flat bread or  sandwich thins.    - Avoid alcohol in excess.   - Exercise is also important. ^<^<^<^<^<^<^<^<^<^<^<^<^<^<^<^<^<^<^<^<^<^<^<^<^<^<^<^<^<^<^<^<^<^<^<^<^ ^>^>^>^>^>^>^>^>^>^>^>^>^>^>^>^>^>^>^>^>^>^>^>^>^>^>^>^>^>^>^>^>^>^>^>^>^  -  A1c is up to 6.5% from Previous 5.8% , So    - Avoid Sweets, Candy & White Stuff   - White Rice, White [place], White Flour  - Breads &  Pasta  ^<^<^<^<^<^<^<^<^<^<^<^<^<^<^<^<^<^<^<^<^<^<^<^<^<^<^<^<^<^<^<^<^<^<^<^<^ ^>^>^>^>^>^>^>^>^>^>^>^>^>^>^>^>^>^>^>^>^>^>^>^>^>^>^>^>^>^>^>^>^>^>^>^>^  -  PSA - Low - No Prostate Cancer  - Great ! ^<^<^<^<^<^<^<^<^<^<^<^<^<^<^<^<^<^<^<^<^<^<^<^<^<^<^<^<^<^<^<^<^<^<^<^<^ ^>^>^>^>^>^>^>^>^>^>^>^>^>^>^>^>^>^>^>^>^>^>^>^>^>^>^>^>^>^>^>^>^>^>^>^>^  -  Vitamin D = 66 - Excellent  - Please keep dose same  ^<^<^<^<^<^<^<^<^<^<^<^<^<^<^<^<^<^<^<^<^<^<^<^<^<^<^<^<^<^<^<^<^<^<^<^<^ ^>^>^>^>^>^>^>^>^>^>^>^>^>^>^>^>^>^>^>^>^>^>^>^>^>^>^>^>^>^>^>^>^>^>^>^>^  -  All Else - CBC - Kidneys - Electrolytes - Liver - Magnesium & Thyroid    - all  Normal / OK  ^<^<^<^<^<^<^<^<^<^<^<^<^<^<^<^<^<^<^<^<^<^<^<^<^<^<^<^<^<^<^<^<^<^<^<^<^ ^>^>^>^>^>^>^>^>^>^>^>^>^>^>^>^>^>^>^>^>^>^>^>^>^>^>^>^>^>^>^>^>^>^>^>^>^

## 2022-09-15 ENCOUNTER — Encounter: Payer: Self-pay | Admitting: Internal Medicine

## 2022-09-17 ENCOUNTER — Other Ambulatory Visit: Payer: Self-pay | Admitting: Nurse Practitioner

## 2022-09-17 DIAGNOSIS — F419 Anxiety disorder, unspecified: Secondary | ICD-10-CM

## 2022-09-24 ENCOUNTER — Other Ambulatory Visit: Payer: Self-pay | Admitting: Nurse Practitioner

## 2022-09-24 DIAGNOSIS — T148XXA Other injury of unspecified body region, initial encounter: Secondary | ICD-10-CM

## 2022-10-20 ENCOUNTER — Other Ambulatory Visit: Payer: Self-pay | Admitting: Nurse Practitioner

## 2022-10-20 DIAGNOSIS — I493 Ventricular premature depolarization: Secondary | ICD-10-CM

## 2022-11-05 DIAGNOSIS — Z1212 Encounter for screening for malignant neoplasm of rectum: Secondary | ICD-10-CM | POA: Diagnosis not present

## 2022-11-05 DIAGNOSIS — Z1211 Encounter for screening for malignant neoplasm of colon: Secondary | ICD-10-CM | POA: Diagnosis not present

## 2022-11-05 LAB — COLOGUARD: Cologuard: POSITIVE — AB

## 2022-11-07 ENCOUNTER — Other Ambulatory Visit (HOSPITAL_COMMUNITY): Payer: Self-pay | Admitting: Neurology

## 2022-11-07 MED ORDER — STUDY - OCEANIC-STROKE - ASUNDEXIAN 50 MG OR PLACEBO TABLET (PI-SETHI): 1.0000 | ORAL_TABLET | Freq: Every day | ORAL | 0 refills | Status: DC

## 2022-11-10 ENCOUNTER — Other Ambulatory Visit: Payer: Self-pay | Admitting: Internal Medicine

## 2022-11-10 DIAGNOSIS — R195 Other fecal abnormalities: Secondary | ICD-10-CM

## 2022-11-10 LAB — COLOGUARD: COLOGUARD: POSITIVE — AB

## 2022-11-10 NOTE — Progress Notes (Signed)
<>*<>*<>*<>*<>*<>*<>*<>*<>*<>*<>*<>*<>*<>*<>*<>*<>*<>*<>*<>*<>*<>*<>*<>*<> <>*<>*<>*<>*<>*<>*<>*<>*<>*<>*<>*<>*<>*<>*<>*<>*<>*<>*<>*<>*<>*<>*<>*<>*<>  -   Cologard is (+) Positive  -  Referral made to [facility] for Colonoscopy    <>*<>*<>*<>*<>*<>*<>*<>*<>*<>*<>*<>*<>*<>*<>*<>*<>*<>*<>*<>*<>*<>*<>*<>*<> <>*<>*<>*<>*<>*<>*<>*<>*<>*<>*<>*<>*<>*<>*<>*<>*<>*<>*<>*<>*<>*<>*<>*<>*<>  -

## 2022-12-11 NOTE — Progress Notes (Unsigned)
FOLLOW UP 3 MONTH  Assessment and Plan:   [name] was seen today for follow-up.  Diagnoses and all orders for this visit:  Essential Hypertension Continue verapamil and HCTZ 25 mg 1 tab daily Decrease Lisinopril to 10 mg 1 tab PO QD Monitor blood pressure at home; patient to call if consistently greater than 130/80 Continue DASH diet.   Reminder to go to the ER if any CP, SOB, nausea, dizziness, severe HA, changes vision/speech, left arm numbness and tingling and jaw pain. -     CBC with Differential/Platelet -     COMPLETE METABOLIC PANEL WITH GFR  Hyperlipidemia associated with type 2 diabetes mellitus ([name]) Taking rosuvastatin 40mg  daily Currently goal of LDL <70;  Continue low cholesterol diet and exercise.  -     Lipid panel  Type 2 diabetes mellitus with stage 2 chronic kidney disease, without long-term current use of insulin ([name]) Discussed dietary and exercise modifications Continue Glucophage 500 mg 2 tab BID a Trulicity 3.0 mg SQ QW sent in since unable to get 4.5 mg due to backorder issue Keep blood sugar log daily and bring to next visit -     Hemoglobin A1c  Vitamin D deficiency Continue supplementation to maintain goal of 60-100  Anxiety Continue Citalopram 40 mg QD Continue diet and exercise  Class 2 severe obesity  associated with comorbidity([name]) Long discussion about weight loss, diet, and exercise Recommended diet heavy in fruits and veggies and low in animal meats, cheeses, and dairy products, appropriate calorie intake Since Trulicity has been on backorder appetite has increased Follow up at next visit   GERD Continue Pantoprazole 40 mg QD and diet modifications  Tobacco use Discussed risks associated with tobacco use and advised to reduce or quit He is not currently ready Will follow up at the next visit  COPD([name]) Advised to stop smoking - risks discussed. Declines medications.  Currently stable without respiratory medications  R BTK  amputation ([name]) Stump healed well; completed PT New sleeve fitted yesterday   Medication management -     CBC with Differential/Platelet -     COMPLETE METABOLIC PANEL WITH GFR -     Lipid panel -     TSH -     Hemoglobin A1c         Continue diet and meds as discussed. Further disposition pending results of labs. Discussed med's effects and SE's.   Over 30 minutes of face to face interview, exam, counseling, chart review, and critical decision making was performed.   Future Appointments  Date Time Provider Department Center  12/12/2022  3:30 PM Raynelle Dick, NP [facility] None  03/17/2023  3:30 PM Lucky Cowboy, MD [facility] None  06/18/2023  3:30 PM Raynelle Dick, NP [facility] None  09/18/2023  3:00 PM Lucky Cowboy, MD [facility] None    ----------------------------------------------------------------------------------------------------------------------  HPI 62 y.o. male  presents for 3 month follow up on hypertension, cholesterol, diabetes, weight and vitamin D deficiency.  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 He continues to follow with orthotics for prosthesis  He has an ulcerated area on his left lower leg that is reddened and has a blackened center.  Has been present for about 3 weeks. He has neuropathy of his left lower leg.   He currently continues to smoke 1 pack a day x 45 years; Started smoking at age [age] discussed risks associated  with smoking, patient is not ready to quit.    BMI is There is no height or weight on file to calculate BMI., he has been working on diet and exercise Wt Readings from Last 3 Encounters:  09/10/22 252 lb (114.3 kg)  07/31/22 250 lb 3.2 oz (113.5 kg)  06/06/22 244 lb (110.7 kg)   His blood pressure has been controlled at home 118/70 with Lisinopril 20 mg QD, HCTZ 25 mg QD, and verapamil  240 mg QD, today their BP is    BP Readings from Last 3 Encounters:  09/10/22 132/66  07/31/22 (!) 96/58  06/06/22 108/60   He does workout. He denies chest pain, shortness of breath, dizziness.   He is on cholesterol medication rosuvastatin 40mg  daily and denies myalgias. His cholesterol is at goal. The cholesterol last visit was:   Lab Results  Component Value Date   CHOL 118 09/10/2022   HDL 32 (L) 09/10/2022   LDLCALC 54 09/10/2022   TRIG 274 (H) 09/10/2022   CHOLHDL 3.7 09/10/2022    He has been working on diet and exercise for T2 diabetes  Neuropathy in bil feet, some numbness and weakness in hands and feet, was on gabapentin but recently improved Hyperlipidemia- Rosuvastatin 40 mg Metformin 500mg  one in morning and two in evening. Trulicity 4.5 mg SQ QW has not had for 6 weeks because on backorder denies foot ulcerations, increased appetite, nausea, polydipsia, polyuria, visual disturbances, vomiting and weight loss.  He checks sugars occasionally, running low 100's Last A1C in the office was:  Lab Results  Component Value Date   HGBA1C 6.5 (H) 09/10/2022    He has been drinking an IV hydration drink and drinks water throughout the day. Gets up 0-1 time a night to urinate. Lab Results  Component Value Date   EGFR 90 09/10/2022    Patient is on Vitamin D supplement and at goal at recent check:    Lab Results  Component Value Date   VD25OH 66 09/10/2022         Current Medications:  Current Outpatient Medications on File Prior to Visit  Medication Sig   cetirizine (ZYRTEC) 10 MG tablet Take 10 mg by mouth daily as needed for allergies.   Cholecalciferol (VITAMIN D) 50 MCG (2000 UT) CAPS Take 2,000 Units by mouth 2 (two) times daily.   citalopram (CELEXA) 40 MG tablet TAKE 1 TABLET BY MOUTH ONCE DAILY FOR MOOD AND CHRONIC ANXIETY   clopidogrel (PLAVIX) 75 MG tablet Take  1 tablet  Daily  to Prevent Blood Clots & Strokes   Dulaglutide (TRULICITY) 3 MG/0.5ML SOPN  Inject 3 mg as directed once a week. (Patient not taking: Reported on 07/31/2022)   hydrochlorothiazide (HYDRODIURIL) 25 MG tablet Take  1 tablet  Daily  for BP & Fluid Retention / Ankle Swelling   lisinopril (ZESTRIL) 10 MG tablet Take 1 tablet (10 mg total) by mouth daily.   metFORMIN (GLUCOPHAGE-XR) 500 MG 24 hr tablet Take 2 tablets 2 x /day with Meals for Diabetes.   mupirocin ointment (BACTROBAN) 2 % APPLY TO AFFECTED AREA TWICE A DAY   pantoprazole (PROTONIX) 40 MG tablet Take  1 tablet   Daily   to Prevent Heartburn & Indigestion   rosuvastatin (CRESTOR) 40 MG tablet Take one tablet daily for Cholesterol.   Study - OCEANIC-STROKE - asundexian 50 mg or placebo tablet (PI-Sethi) Take 1 tablet (50 mg total) by mouth daily. For investigational use only. Take 1 tablet by mouth once  daily at the same time each day, preferably in the morning. Please bring bottle to every visit.   verapamil (CALAN-SR) 240 MG CR tablet TAKE 1 TABLET BY MOUTH EVERY DAY FOR BLOOD PRESSURE AND HEART RHYTHM   No current facility-administered medications on file prior to visit.     Allergies:  Allergies  Allergen Reactions   Invokana [Canagliflozin] Other (See Comments)    Extremity edema/caused pain   Codeine Camsylate [Codeine] Rash   Morphine And Codeine Rash     Medical History:  Past Medical History:  Diagnosis Date   Diabetic neuropathy ([name])    Diverticulitis    History of hepatitis C    has been treated in the past   History of kidney stones    Hyperlipidemia    Hypertension    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[name])    Other testicular hypofunction    Severe sepsis ([name]) 08/26/2019   Type II or unspecified type diabetes mellitus without mention of complication, not stated as uncontrolled    Vitamin D deficiency    Family history- Reviewed and unchanged Social history- Reviewed and unchanged   Review of Systems:  Review of Systems  Constitutional:  Negative for malaise/fatigue and weight loss.   HENT:  Negative for hearing loss and tinnitus.   Eyes:  Negative for blurred vision and double vision.  Respiratory:  Negative for cough, shortness of breath and wheezing.   Cardiovascular:  Negative for chest pain, palpitations, orthopnea, claudication and leg swelling.  Gastrointestinal:  Negative for abdominal pain, blood in stool, constipation, diarrhea, heartburn, melena, nausea and vomiting.  Genitourinary: Negative.   Musculoskeletal:  Negative for joint pain and myalgias.  Skin:  Negative for rash.       Skin breakdown of left calf  Neurological:  Negative for dizziness, tingling, sensory change, weakness and headaches.  Endo/Heme/Allergies:  Negative for polydipsia.  Psychiatric/Behavioral:  Negative for depression, hallucinations, memory loss and substance abuse. The patient is not nervous/anxious.   All other systems reviewed and are negative.    Physical Exam: There were no vitals taken for this visit. Wt Readings from Last 3 Encounters:  09/10/22 252 lb (114.3 kg)  07/31/22 250 lb 3.2 oz (113.5 kg)  06/06/22 244 lb (110.7 kg)   General Appearance: Well nourished, in no apparent distress. Eyes: PERRLA, EOMs, conjunctiva no swelling or erythema Sinuses: No Frontal/maxillary tenderness ENT/Mouth: Ext aud canals clear, TMs without erythema, bulging. No erythema, swelling, or exudate on post pharynx.  Tonsils not swollen or erythematous. Hearing normal.  Neck: Supple, thyroid normal. No carotid bruits heard Respiratory: Respiratory effort normal, BS equal bilaterally with diffuse wheezing without rales, rhonchi,  or stridor.  Cardio: RRR with no MRGs. Brisk peripheral pulses without edema.  Abdomen: Soft, + BS.  Non tender, no guarding, rebound, hernias, masses. Lymphatics: Non tender without lymphadenopathy.  Musculoskeletal:; R BTK amputation w/ prosthesis. Otherwise no deformity. Symmetrical upper extremity strength.  Skin: Warm, dry . 2 cm reddened area on left calf  with blackened area in center. Neuro: Cranial nerves intact. No cerebellar symptoms.  Psych: Awake and oriented X 3, normal affect, Insight and Judgment appropriate.    Raynelle Dick, NP 12:36 PM [facility]

## 2022-12-12 ENCOUNTER — Encounter: Payer: Self-pay | Admitting: Nurse Practitioner

## 2022-12-12 ENCOUNTER — Ambulatory Visit (INDEPENDENT_AMBULATORY_CARE_PROVIDER_SITE_OTHER): Payer: 59 | Admitting: Nurse Practitioner

## 2022-12-12 VITALS — BP 122/62 | HR 70 | Temp 97.5°F | Ht 74.0 in | Wt 251.8 lb

## 2022-12-12 DIAGNOSIS — E1122 Type 2 diabetes mellitus with diabetic chronic kidney disease: Secondary | ICD-10-CM

## 2022-12-12 DIAGNOSIS — K219 Gastro-esophageal reflux disease without esophagitis: Secondary | ICD-10-CM

## 2022-12-12 DIAGNOSIS — I10 Essential (primary) hypertension: Secondary | ICD-10-CM | POA: Diagnosis not present

## 2022-12-12 DIAGNOSIS — E669 Obesity, unspecified: Secondary | ICD-10-CM

## 2022-12-12 DIAGNOSIS — E785 Hyperlipidemia, unspecified: Secondary | ICD-10-CM | POA: Diagnosis not present

## 2022-12-12 DIAGNOSIS — E559 Vitamin D deficiency, unspecified: Secondary | ICD-10-CM

## 2022-12-12 DIAGNOSIS — F419 Anxiety disorder, unspecified: Secondary | ICD-10-CM

## 2022-12-12 DIAGNOSIS — S81812A Laceration without foreign body, left lower leg, initial encounter: Secondary | ICD-10-CM

## 2022-12-12 DIAGNOSIS — N182 Chronic kidney disease, stage 2 (mild): Secondary | ICD-10-CM

## 2022-12-12 DIAGNOSIS — E1169 Type 2 diabetes mellitus with other specified complication: Secondary | ICD-10-CM | POA: Diagnosis not present

## 2022-12-12 DIAGNOSIS — E1140 Type 2 diabetes mellitus with diabetic neuropathy, unspecified: Secondary | ICD-10-CM | POA: Diagnosis not present

## 2022-12-12 DIAGNOSIS — R195 Other fecal abnormalities: Secondary | ICD-10-CM

## 2022-12-12 DIAGNOSIS — E1149 Type 2 diabetes mellitus with other diabetic neurological complication: Secondary | ICD-10-CM

## 2022-12-12 DIAGNOSIS — I6330 Cerebral infarction due to thrombosis of unspecified cerebral artery: Secondary | ICD-10-CM

## 2022-12-12 DIAGNOSIS — Z79899 Other long term (current) drug therapy: Secondary | ICD-10-CM | POA: Diagnosis not present

## 2022-12-12 DIAGNOSIS — Z89511 Acquired absence of right leg below knee: Secondary | ICD-10-CM

## 2022-12-12 DIAGNOSIS — J449 Chronic obstructive pulmonary disease, unspecified: Secondary | ICD-10-CM

## 2022-12-12 NOTE — Patient Instructions (Signed)

## 2022-12-13 ENCOUNTER — Other Ambulatory Visit: Payer: Self-pay | Admitting: Nurse Practitioner

## 2022-12-13 ENCOUNTER — Encounter: Payer: Self-pay | Admitting: Gastroenterology

## 2022-12-13 DIAGNOSIS — L089 Local infection of the skin and subcutaneous tissue, unspecified: Secondary | ICD-10-CM

## 2022-12-13 DIAGNOSIS — D72821 Monocytosis (symptomatic): Secondary | ICD-10-CM

## 2022-12-13 LAB — HEMOGLOBIN A1C W/OUT EAG: Hgb A1c MFr Bld: 6.8 %{Hb} — ABNORMAL HIGH (ref <5.7)

## 2022-12-13 LAB — LIPID PANEL
Cholesterol: 93 mg/dL (ref <200)
HDL: 27 mg/dL — ABNORMAL LOW (ref >=40)
LDL Cholesterol (Calc): 41 mg/dL
Non-HDL Cholesterol (Calc): 66 mg/dL (ref <130)
Total CHOL/HDL Ratio: 3.4 (calc) (ref <5.0)
Triglycerides: 176 mg/dL — ABNORMAL HIGH (ref <150)

## 2022-12-13 LAB — CBC WITH DIFFERENTIAL/PLATELET
Absolute Lymphocytes: 4244 {cells}/uL — ABNORMAL HIGH (ref 850–3900)
Absolute Monocytes: 1074 {cells}/uL — ABNORMAL HIGH (ref 200–950)
Basophils Absolute: 105 {cells}/uL (ref 0–200)
Basophils Relative: 0.8 %
Eosinophils Absolute: 197 {cells}/uL (ref 15–500)
Eosinophils Relative: 1.5 %
HCT: 42 % (ref 38.5–50.0)
Hemoglobin: 14.5 g/dL (ref 13.2–17.1)
MCH: 31.8 pg (ref 27.0–33.0)
MCHC: 34.5 g/dL (ref 32.0–36.0)
MCV: 92.1 fL (ref 80.0–100.0)
MPV: 11.6 fL (ref 7.5–12.5)
Monocytes Relative: 8.2 %
Neutro Abs: 7480 {cells}/uL (ref 1500–7800)
Neutrophils Relative %: 57.1 %
Platelets: 220 10*3/uL (ref 140–400)
RBC: 4.56 10*6/uL (ref 4.20–5.80)
RDW: 12.8 % (ref 11.0–15.0)
Total Lymphocyte: 32.4 %
WBC: 13.1 10*3/uL — ABNORMAL HIGH (ref 3.8–10.8)

## 2022-12-13 LAB — COMPLETE METABOLIC PANEL WITH GFR
AG Ratio: 1.5 (calc) (ref 1.0–2.5)
ALT: 28 U/L (ref 9–46)
AST: 22 U/L (ref 10–35)
Albumin: 4.1 g/dL (ref 3.6–5.1)
Alkaline phosphatase (APISO): 75 U/L (ref 35–144)
BUN: 23 mg/dL (ref 7–25)
CO2: 25 mmol/L (ref 20–32)
Calcium: 9.3 mg/dL (ref 8.6–10.3)
Chloride: 100 mmol/L (ref 98–110)
Creat: 0.91 mg/dL (ref 0.70–1.35)
Globulin: 2.7 g/dL (ref 1.9–3.7)
Glucose, Bld: 89 mg/dL (ref 65–99)
Potassium: 4.7 mmol/L (ref 3.5–5.3)
Sodium: 135 mmol/L (ref 135–146)
Total Bilirubin: 0.4 mg/dL (ref 0.2–1.2)
Total Protein: 6.8 g/dL (ref 6.1–8.1)
eGFR: 94 mL/min/{1.73_m2} (ref >=60)

## 2022-12-13 MED ORDER — DOXYCYCLINE HYCLATE 100 MG PO CAPS: ORAL_CAPSULE | ORAL | 0 refills | Status: DC

## 2022-12-13 NOTE — Progress Notes (Signed)
Did no appear to be infected yesterday. I will send in script for doxycycline since WBC is elevated

## 2023-01-15 ENCOUNTER — Other Ambulatory Visit: Payer: Self-pay | Admitting: Nurse Practitioner

## 2023-01-15 ENCOUNTER — Other Ambulatory Visit: Payer: Self-pay | Admitting: Internal Medicine

## 2023-01-15 DIAGNOSIS — I493 Ventricular premature depolarization: Secondary | ICD-10-CM

## 2023-01-23 ENCOUNTER — Telehealth: Payer: Self-pay | Admitting: Nurse Practitioner

## 2023-01-23 NOTE — Telephone Encounter (Signed)
 Patient is going into the dentistry on 1/6 for a possible tooth extraction. Nurse called to ask if patent should stop taking plavix?

## 2023-01-24 NOTE — Telephone Encounter (Signed)
 Called Patients dentist and left a message and also called patient to let him know to stop it 4 days a procedure and he can start back 3 days after the procedure.

## 2023-01-27 ENCOUNTER — Inpatient Hospital Stay: Payer: 59

## 2023-01-27 NOTE — Progress Notes (Deleted)
 [facility] CONSULT NOTE  Patient Care Team: Tonita Fallow, MD as PCP - General (Internal Medicine)   ASSESSMENT & PLAN 63 y.o.male with history of cigarette smoking, type 2 diabetes, hypertension, hyperlipidemia, COPD being seen for monocytosis. No problem-specific Assessment & Plan notes found for this encounter.   No orders of the defined types were placed in this encounter.    WBC count in 2.5 % of the normal population will be greater than two standard deviations above the mean (ie, >11,000/microL).  Check CRP for inflammatory disease Blood Smear for morphology Check JAK 2, BCR-ABL if combination of neutrophilia and polycythemia, thrombocytosis, basophilia, eosinophilia   All questions were answered. The patient knows to call the clinic with any problems, questions or concerns. I spent {CHL ONC TIME VISIT - DTPQU:[id number]} counseling the patient face to face, counseling and review of plan of care.   Pauletta JAYSON Chihuahua, MD 01/27/2023 10:04 AM   CHIEF COMPLAINTS/PURPOSE OF CONSULTATION:  Leukocytosis   HISTORY OF PRESENTING ILLNESS:  VERNE LANUZA 63 y.o. male is here because of elevated WBC.  Records reviewed, lab reported monocytosis and patterns of intermittent waxing waning with normal counts many times as well.  They are borderline elevated.  Most recent was 1.074.  Total WBC was evaded at 13.  Trending down from May and August 2024.  History of infection  Chest pain, palpitation He denies any night sweats, weight loss, decreased appetite, lymphadenopathy, abdominal distention.  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  He is an active cigarette smoker.   MEDICAL HISTORY:  Past Medical History:  Diagnosis Date   Diabetic neuropathy ([name])    Diverticulitis    History of hepatitis C    has been treated in the past   History of kidney stones    Hyperlipidemia    Hypertension    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[name])    Other testicular  hypofunction    Severe sepsis ([name]) 08/26/2019   Type II or unspecified type diabetes mellitus without mention of complication, not stated as uncontrolled    Vitamin D  deficiency     SURGICAL HISTORY: Past Surgical History:  Procedure Laterality Date   AMPUTATION Right 09/01/2019   Procedure: RIGHT BELOW KNEE AMPUTATION;  Surgeon: Harden Jerona GAILS, MD;  Location: [facility] OR;  Service: Orthopedics;  Laterality: Right;   CHOLECYSTECTOMY  1987   COLONOSCOPY     I & D EXTREMITY Right 08/20/2019   Procedure: IRRIGATION & DEBRIDEMENT RIGHT FOOT PARTIAL CALCANEAL EXCISION;  Surgeon: Harden Jerona GAILS, MD;  Location: [facility] OR;  Service: Orthopedics;  Laterality: Right;   I & D EXTREMITY Right 08/27/2019   Procedure: IRRIGATION AND DEBRIDEMENT  OF FOOT;  Surgeon: Jerri Kay HERO, MD;  Location: [facility] OR;  Service: Orthopedics;  Laterality: Right;   ORIF TIBIA FRACTURE     x 3 right leg    SOCIAL HISTORY: Social History   Socioeconomic History   Marital status: Married    Spouse name: Verneita   Number of children: 0   Years of education: Not on file   Highest education level: High school graduate  Occupational History   Not on file  Tobacco Use   Smoking status: Former    Current packs/day: 0.00    Types: Cigarettes    Quit date: 05/07/2021    Years since quitting: 1.7   Smokeless tobacco: Never  Vaping Use   Vaping status: Never Used  Substance and Sexual Activity  Alcohol use: No   Drug use: Yes    Types: Marijuana   Sexual activity: Not on file  Other Topics Concern   Not on file  Social History Narrative   Lives with wife teresa   L handed   Caffeine: 2 C of coffee a day and 2-3 diet mth dews a day   Social Drivers of Corporate Investment Banker Strain: Not on file  Food Insecurity: Not on file  Transportation Needs: Not on file  Physical Activity: Not on file  Stress: Not on file  Social Connections: Not on file  Intimate Partner Violence: Not on file    FAMILY HISTORY: Family  History  Problem Relation Age of Onset   Hypertension Mother    Cancer Father        colon   Alzheimer's disease Father    Stroke Father     ALLERGIES:  is allergic to invokana [canagliflozin], codeine camsylate [codeine], and morphine and codeine.  MEDICATIONS:  Current Outpatient Medications  Medication Sig Dispense Refill   cetirizine (ZYRTEC) 10 MG tablet Take 10 mg by mouth daily as needed for allergies.     Cholecalciferol (VITAMIN D ) 50 MCG (2000 UT) CAPS Take 2,000 Units by mouth 2 (two) times daily.     citalopram  (CELEXA ) 40 MG tablet TAKE 1 TABLET BY MOUTH ONCE DAILY FOR MOOD AND CHRONIC ANXIETY 90 tablet 2   clopidogrel  (PLAVIX ) 75 MG tablet Take  1 tablet  Daily  to Prevent Blood Clots & Strokes 90 tablet 3   doxycycline  (VIBRAMYCIN ) 100 MG capsule Take 1 capsule twice daily with food 20 capsule 0   hydrochlorothiazide  (HYDRODIURIL ) 25 MG tablet Take  1 tablet  Daily  for BP & Fluid Retention / Ankle Swelling 90 tablet 3   lisinopril  (ZESTRIL ) 10 MG tablet Take 1 tablet (10 mg total) by mouth daily. 30 tablet 11   metFORMIN  (GLUCOPHAGE -XR) 500 MG 24 hr tablet Take  2 tablets  2 x / day  with Meals  for Diabetes                                             /                                                                   TAKE                                         BY                                                 MOUTH 360 tablet 3   pantoprazole  (PROTONIX ) 40 MG tablet Take  1 tablet   Daily   to Prevent Heartburn & Indigestion 90 tablet 3   rosuvastatin  (CRESTOR ) 40 MG tablet Take one tablet daily for Cholesterol. 90 tablet 3   Study - OCEANIC-STROKE -  asundexian 50 mg or placebo tablet (PI-Sethi) Take 1 tablet (50 mg total) by mouth daily. For investigational use only. Take 1 tablet by mouth once daily at the same time each day, preferably in the morning. Please bring bottle to every visit. 196 tablet 0   verapamil  (CALAN -SR) 240 MG CR tablet Take 1 tablet  Daily with a  Meal for BP & Heart Rhythm                                                             /                                                                   TAKE                                         BY                                                 MOUTH 90 tablet 3   Zinc 50 MG TABS Take by mouth.     No current facility-administered medications for this visit.    REVIEW OF SYSTEMS:   All relevant systems were reviewed with the patient and are negative.  PHYSICAL EXAMINATION: ECOG PERFORMANCE STATUS: {CHL ONC ECOG PS:[phone]}  There were no vitals filed for this visit. There were no vitals filed for this visit.  GENERAL: alert, no distress and comfortable SKIN: skin color normal, no rashes or significant lesions EYES: normal, sclera clear OROPHARYNX: no exudate, no erythema  NECK: supple, non-tender, without nodularity LYMPH:  no palpable cervical, axillary lymphadenopathy LUNGS: clear to auscultation and no wheezes, rales and with normal breathing effort HEART: regular rate & rhythm and no murmurs ABDOMEN: abdomen soft, non-tender and nondistended Musculoskeletal: and no lower extremity edema NEURO: no focal motor/sensory deficits  LABORATORY DATA:  I have reviewed the data as listed Recent Results (from the past 2160 hours)  Cologuard     Status: Abnormal   Collection Time: 11/05/22  9:35 AM  Result Value Ref Range   COLOGUARD Positive (A) Negative    Comment:  POSITIVE TEST RESULT. A positive Cologuard result should be followed with a colonoscopy or visual examination of the colon. The normal value (reference range) for this assay is negative.  TEST DESCRIPTION: Composite algorithmic analysis of stool DNA-biomarkers with hemoglobin immunoassay.   Quantitative values of individual biomarkers are not reportable and are not associated with individual biomarker result reference ranges. Cologuard is intended for colorectal cancer screening of adults of either sex, 45 years or  older, who are at average-risk for colorectal cancer (CRC). Cologuard has been approved for use by the [place]. FDA. The performance of Cologuard was established in a cross sectional study of average-risk adults aged 56-84. Cologuard performance in patients ages 50 to  49 years was estimated by sub-group analysis of near-age groups. Colonoscopies performed for a positive result may find as the most clinically significant lesion: colorectal cancer [4.0%], advanced adenoma  (including sessile serrated polyps greater than or equal to 1cm diameter) [20%] or non- advanced adenoma [31%]; or no colorectal neoplasia [45%]. These estimates are derived from a prospective cross-sectional screening study of 10,000 individuals at average risk for colorectal cancer who were screened with both Cologuard and colonoscopy. (Imperiale T. et al, LOISE [place] J Med 2014;370(14):1286-1297.) Cologuard may produce a false negative or false positive result (no colorectal cancer or precancerous polyp present at colonoscopy follow up). A negative Cologuard test result does not guarantee the absence of CRC or advanced adenoma (pre-cancer). The current Cologuard screening interval is every 3 years. Science Writer and U.[place]. Therapist, Music). Cologuard performance data in a 10,000 patient pivotal study using colonoscopy as the reference method can be accessed at the following location: www.exactlabs.com/results. Additional description of the Cologuard test process,  warnings and precautions can be found at www.cologuard.com.   CBC with Differential/Platelet     Status: Abnormal   Collection Time: 12/12/22  4:39 PM  Result Value Ref Range   WBC 13.1 (H) 3.8 - 10.8 Thousand/uL   RBC 4.56 4.20 - 5.80 Million/uL   Hemoglobin 14.5 13.2 - 17.1 g/dL   HCT 57.9 61.4 - 49.9 %   MCV 92.1 80.0 - 100.0 fL   MCH 31.8 27.0 - 33.0 pg   MCHC 34.5 32.0 - 36.0 g/dL    Comment: For adults, a slight decrease in the calculated MCHC value (in the  range of 30 to 32 g/dL) is most likely not clinically significant; however, it should be interpreted with caution in correlation with other red cell parameters and the patient's clinical condition.    RDW 12.8 11.0 - 15.0 %   Platelets 220 140 - 400 Thousand/uL   MPV 11.6 7.5 - 12.5 fL   Neutro Abs 7,480 1,500 - 7,800 cells/uL   Absolute Lymphocytes 4,244 (H) 850 - 3,900 cells/uL   Absolute Monocytes 1,074 (H) 200 - 950 cells/uL   Eosinophils Absolute 197 15 - 500 cells/uL   Basophils Absolute 105 0 - 200 cells/uL   Neutrophils Relative % 57.1 %   Total Lymphocyte 32.4 %   Monocytes Relative 8.2 %   Eosinophils Relative 1.5 %   Basophils Relative 0.8 %  COMPLETE METABOLIC PANEL WITH GFR     Status: None   Collection Time: 12/12/22  4:39 PM  Result Value Ref Range   Glucose, Bld 89 65 - 99 mg/dL    Comment: .            Fasting reference interval .    BUN 23 7 - 25 mg/dL   Creat 9.08 9.29 - 8.64 mg/dL   eGFR 94 > OR = 60 fO/fpw/8.26f7   BUN/Creatinine Ratio SEE NOTE: 6 - 22 (calc)    Comment:    Not Reported: BUN and Creatinine are within    reference range. .    Sodium 135 135 - 146 mmol/L   Potassium 4.7 3.5 - 5.3 mmol/L   Chloride 100 98 - 110 mmol/L   CO2 25 20 - 32 mmol/L   Calcium  9.3 8.6 - 10.3 mg/dL   Total Protein 6.8 6.1 - 8.1 g/dL   Albumin 4.1 3.6 - 5.1 g/dL   Globulin 2.7 1.9 - 3.7 g/dL (calc)   AG Ratio 1.5 1.0 - 2.5 (calc)  Total Bilirubin 0.4 0.2 - 1.2 mg/dL   Alkaline phosphatase (APISO) 75 35 - 144 U/L   AST 22 10 - 35 U/L   ALT 28 9 - 46 U/L  Lipid panel     Status: Abnormal   Collection Time: 12/12/22  4:39 PM  Result Value Ref Range   Cholesterol 93 <200 mg/dL   HDL 27 (L) > OR = 40 mg/dL   Triglycerides 823 (H) <150 mg/dL   LDL Cholesterol (Calc) 41 mg/dL (calc)    Comment: Reference range: <100 . Desirable range <100 mg/dL for primary prevention;   <70 mg/dL for patients with CHD or diabetic patients  with > or = 2 CHD risk  factors. [name] LDL-C is now calculated using the Martin-Hopkins  calculation, which is a validated novel method providing  better accuracy than the Friedewald equation in the  estimation of LDL-C.  Gladis APPLETHWAITE et al. SANDREA. 7986;689(80): 2061-2068  (http://education.[facility].com/faq/FAQ164)    Total CHOL/HDL Ratio 3.4 <5.0 (calc)   Non-HDL Cholesterol (Calc) 66 <869 mg/dL (calc)    Comment: For patients with diabetes plus 1 major ASCVD risk  factor, treating to a non-HDL-C goal of <100 mg/dL  (LDL-C of <29 mg/dL) is considered a therapeutic  option.   Hemoglobin A1C w/out eAG     Status: Abnormal   Collection Time: 12/12/22  4:39 PM  Result Value Ref Range   Hgb A1c MFr Bld 6.8 (H) <5.7 % of total Hgb    Comment: For someone without known diabetes, a hemoglobin A1c value of 6.5% or greater indicates that they may have  diabetes and this should be confirmed with a follow-up  test. . For someone with known diabetes, a value <7% indicates  that their diabetes is well controlled and a value  greater than or equal to 7% indicates suboptimal  control. A1c targets should be individualized based on  duration of diabetes, age, comorbid conditions, and  other considerations. . Currently, no consensus exists regarding use of hemoglobin A1c for diagnosis of diabetes for children. [name]     RADIOGRAPHIC STUDIES: I have personally reviewed the radiological images as listed and agreed with the findings in the report. No results found.

## 2023-02-13 ENCOUNTER — Other Ambulatory Visit: Payer: Self-pay | Admitting: Nurse Practitioner

## 2023-02-13 DIAGNOSIS — I10 Essential (primary) hypertension: Secondary | ICD-10-CM

## 2023-02-13 DIAGNOSIS — E1169 Type 2 diabetes mellitus with other specified complication: Secondary | ICD-10-CM

## 2023-02-21 ENCOUNTER — Other Ambulatory Visit: Payer: Self-pay

## 2023-02-21 DIAGNOSIS — D72821 Monocytosis (symptomatic): Secondary | ICD-10-CM

## 2023-02-24 ENCOUNTER — Inpatient Hospital Stay: Payer: Medicaid Other | Attending: Internal Medicine | Admitting: Internal Medicine

## 2023-02-24 ENCOUNTER — Inpatient Hospital Stay: Payer: Medicaid Other

## 2023-02-24 VITALS — BP 121/65 | HR 65 | Temp 97.8°F | Resp 18 | Ht 74.0 in | Wt 257.5 lb

## 2023-02-24 DIAGNOSIS — Z87891 Personal history of nicotine dependence: Secondary | ICD-10-CM | POA: Insufficient documentation

## 2023-02-24 DIAGNOSIS — D72821 Monocytosis (symptomatic): Secondary | ICD-10-CM | POA: Diagnosis not present

## 2023-02-24 DIAGNOSIS — E785 Hyperlipidemia, unspecified: Secondary | ICD-10-CM | POA: Diagnosis not present

## 2023-02-24 DIAGNOSIS — J449 Chronic obstructive pulmonary disease, unspecified: Secondary | ICD-10-CM | POA: Insufficient documentation

## 2023-02-24 DIAGNOSIS — E119 Type 2 diabetes mellitus without complications: Secondary | ICD-10-CM

## 2023-02-24 DIAGNOSIS — I10 Essential (primary) hypertension: Secondary | ICD-10-CM | POA: Insufficient documentation

## 2023-02-24 DIAGNOSIS — Z800 Family history of malignant neoplasm of digestive organs: Secondary | ICD-10-CM

## 2023-02-24 LAB — CMP (CANCER CENTER ONLY)
ALT: 35 U/L (ref 0–44)
AST: 27 U/L (ref 15–41)
Albumin: 3.9 g/dL (ref 3.5–5.0)
Alkaline Phosphatase: 64 U/L (ref 38–126)
Anion gap: 8 (ref 5–15)
BUN: 20 mg/dL (ref 8–23)
CO2: 27 mmol/L (ref 22–32)
Calcium: 8.8 mg/dL — ABNORMAL LOW (ref 8.9–10.3)
Chloride: 100 mmol/L (ref 98–111)
Creatinine: 0.96 mg/dL (ref 0.61–1.24)
GFR, Estimated: >60 mL/min (ref >60)
Glucose, Bld: 174 mg/dL — ABNORMAL HIGH (ref 70–99)
Potassium: 4.4 mmol/L (ref 3.5–5.1)
Sodium: 135 mmol/L (ref 135–145)
Total Bilirubin: 0.5 mg/dL (ref 0.0–1.2)
Total Protein: 6.9 g/dL (ref 6.5–8.1)

## 2023-02-24 LAB — CBC WITH DIFFERENTIAL (CANCER CENTER ONLY)
Abs Immature Granulocytes: 0.04 10*3/uL (ref 0.00–0.07)
Basophils Absolute: 0.1 10*3/uL (ref 0.0–0.1)
Basophils Relative: 1 %
Eosinophils Absolute: 0.1 10*3/uL (ref 0.0–0.5)
Eosinophils Relative: 1 %
HCT: 42.6 % (ref 39.0–52.0)
Hemoglobin: 14.3 g/dL (ref 13.0–17.0)
Immature Granulocytes: 0 %
Lymphocytes Relative: 28 %
Lymphs Abs: 3 10*3/uL (ref 0.7–4.0)
MCH: 31 pg (ref 26.0–34.0)
MCHC: 33.6 g/dL (ref 30.0–36.0)
MCV: 92.2 fL (ref 80.0–100.0)
Monocytes Absolute: 0.9 10*3/uL (ref 0.1–1.0)
Monocytes Relative: 9 %
Neutro Abs: 6.4 10*3/uL (ref 1.7–7.7)
Neutrophils Relative %: 61 %
Platelet Count: 167 10*3/uL (ref 150–400)
RBC: 4.62 MIL/uL (ref 4.22–5.81)
RDW: 14.2 % (ref 11.5–15.5)
WBC Count: 10.6 10*3/uL — ABNORMAL HIGH (ref 4.0–10.5)
nRBC: 0 % (ref 0.0–0.2)

## 2023-02-24 LAB — LACTATE DEHYDROGENASE: LDH: 127 U/L (ref 98–192)

## 2023-02-24 NOTE — Progress Notes (Signed)
[facility] [phone]   [phone]  CONSULT NOTE  REFERRING PHYSICIAN: Dr. Lucky Cowboy  REASON FOR CONSULTATION:  63 years old white male with monocytosis  HPI [name] is a 63 y.o. male. Discussed the use of AI scribe software for clinical note transcription with the patient, who gave verbal consent to proceed.  History of Present Illness   The patient is a 63 year old male who presents with elevated monocytes. He is accompanied by his wife, [name]. He was referred by Dr. Oneta Rack for evaluation of elevated monocytes.  He presents for evaluation of elevated monocytes discovered during routine blood work. The initial monocyte count was 1199, which decreased to 1074 on follow-up. Current blood work shows monocytes at 900, indicating improvement. The white blood cell count is slightly elevated at 10.6. No fever, weakness, nausea, vomiting, diarrhea, headaches, chest pain, or shortness of breath.  He has experienced recent weight gain after discontinuing Trulicity due to a shortage and subsequent side effects of diarrhea with a different dosage. He has not taken Trulicity for about eight months and has gained weight from 240 lbs to 257 lbs.  His past medical history includes high blood pressure, diabetes, high cholesterol, vitamin D deficiency, obesity, anxiety, acid reflux, COPD, a history of stroke, and right below-knee amputation. He is allergic to codeine and morphine.  Socially, he is a former Media planner for [employer]. He is married with one son, two grandchildren, and three great-grandchildren. He is currently using a patch to quit smoking, does not drink alcohol regularly, and uses marijuana.  Family history includes dementia in both parents and a brain aneurysm in his mother. There is no family history of cancer.      HPI  Past Medical History:  Diagnosis Date   Diabetic neuropathy ([name])     Diverticulitis    History of hepatitis C    has been treated in the past   History of kidney stones    Hyperlipidemia    Hypertension    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[name])    Other testicular hypofunction    Severe sepsis ([name]) 08/26/2019   Type II or unspecified type diabetes mellitus without mention of complication, not stated as uncontrolled    Vitamin D deficiency     Past Surgical History:  Procedure Laterality Date   AMPUTATION Right 09/01/2019   Procedure: RIGHT BELOW KNEE AMPUTATION;  Surgeon: Nadara Mustard, MD;  Location: [facility] OR;  Service: Orthopedics;  Laterality: Right;   CHOLECYSTECTOMY  1987   COLONOSCOPY     I & D EXTREMITY Right 08/20/2019   Procedure: IRRIGATION & DEBRIDEMENT RIGHT FOOT PARTIAL CALCANEAL EXCISION;  Surgeon: Nadara Mustard, MD;  Location: [facility] OR;  Service: Orthopedics;  Laterality: Right;   I & D EXTREMITY Right 08/27/2019   Procedure: IRRIGATION AND DEBRIDEMENT  OF FOOT;  Surgeon: Tarry Kos, MD;  Location: [facility] OR;  Service: Orthopedics;  Laterality: Right;   ORIF TIBIA FRACTURE     x 3 right leg    Family History  Problem Relation Age of Onset   Hypertension Mother    Cancer Father        colon   Alzheimer's disease Father    Stroke Father     Social History Social History   Tobacco Use   Smoking status: Former    Current packs/day: 0.00    Types: Cigarettes    Quit date: 05/07/2021  Years since quitting: 1.8   Smokeless tobacco: Never  Vaping Use   Vaping status: Never Used  Substance Use Topics   Alcohol use: No   Drug use: Yes    Types: Marijuana    Allergies  Allergen Reactions   Invokana [Canagliflozin] Other (See Comments)    Extremity edema/caused pain   Codeine Camsylate [Codeine] Rash   Morphine And Codeine Rash    Current Outpatient Medications  Medication Sig Dispense Refill   cetirizine (ZYRTEC) 10 MG tablet Take 10 mg by mouth daily as needed for allergies.     Cholecalciferol (VITAMIN D) 50 MCG (2000 UT) CAPS  Take 2,000 Units by mouth 2 (two) times daily.     citalopram (CELEXA) 40 MG tablet TAKE 1 TABLET BY MOUTH ONCE DAILY FOR MOOD AND CHRONIC ANXIETY 90 tablet 2   clopidogrel (PLAVIX) 75 MG tablet Take  1 tablet  Daily  to Prevent Blood Clots & Strokes 90 tablet 3   hydrochlorothiazide (HYDRODIURIL) 25 MG tablet TAKE ONE TABLET BY MOUTH DAILY FOR BLOOD PRESSURE,FLUID RETENTION,ANKLE SWELLING 30 tablet 11   lisinopril (ZESTRIL) 10 MG tablet Take 1 tablet (10 mg total) by mouth daily. 30 tablet 11   metFORMIN (GLUCOPHAGE-XR) 500 MG 24 hr tablet Take  2 tablets  2 x / day  with Meals  for Diabetes                                             /                                                                   TAKE                                         BY                                                 MOUTH 360 tablet 3   pantoprazole (PROTONIX) 40 MG tablet Take  1 tablet   Daily   to Prevent Heartburn & Indigestion 90 tablet 3   rosuvastatin (CRESTOR) 40 MG tablet TAKE ONE TABLET BY MOUTH DAILY FOR CHOLESTEROL 30 tablet 11   Study - OCEANIC-STROKE - asundexian 50 mg or placebo tablet (PI-Sethi) Take 1 tablet (50 mg total) by mouth daily. For investigational use only. Take 1 tablet by mouth once daily at the same time each day, preferably in the morning. Please bring bottle to every visit. 196 tablet 0   verapamil (CALAN-SR) 240 MG CR tablet Take 1 tablet  Daily with a Meal for BP & Heart Rhythm                                                             /  TAKE                                         BY                                                 MOUTH 90 tablet 3   Zinc 50 MG TABS Take by mouth.     No current facility-administered medications for this visit.    Review of Systems  Constitutional: negative Eyes: negative Ears, nose, mouth, throat, and face: negative Respiratory: negative Cardiovascular: negative Gastrointestinal:  negative Genitourinary:negative Integument/breast: negative Hematologic/lymphatic: negative Musculoskeletal:negative Neurological: negative Behavioral/Psych: negative Endocrine: negative Allergic/Immunologic: negative  Physical Exam  ZOX:WRUEA, healthy, no distress, well nourished, and well developed SKIN: skin color, texture, turgor are normal, no rashes or significant lesions HEAD: Normocephalic, No masses, lesions, tenderness or abnormalities EYES: normal, PERRLA, Conjunctiva are pink and non-injected EARS: External ears normal, Canals clear OROPHARYNX:no exudate, no erythema, and lips, buccal mucosa, and tongue normal  NECK: supple, no adenopathy, no JVD LYMPH:  no palpable lymphadenopathy, no hepatosplenomegaly LUNGS: clear to auscultation , and palpation HEART: regular rate & rhythm, no murmurs, and no gallops ABDOMEN:abdomen soft, non-tender, normal bowel sounds, and no masses or organomegaly BACK: Back symmetric, no curvature., No CVA tenderness EXTREMITIES:no joint deformities, effusion, or inflammation, no edema, [name] NEURO: alert & oriented x 3 with fluent speech  PERFORMANCE STATUS: ECOG 1  LABORATORY DATA: Lab Results  Component Value Date   WBC 10.6 (H) 02/24/2023   HGB 14.3 02/24/2023   HCT 42.6 02/24/2023   MCV 92.2 02/24/2023   PLT 167 02/24/2023      Chemistry      Component Value Date/Time   NA 135 02/24/2023 1058   K 4.4 02/24/2023 1058   CL 100 02/24/2023 1058   CO2 27 02/24/2023 1058   BUN 20 02/24/2023 1058   CREATININE 0.96 02/24/2023 1058   CREATININE 0.91 12/12/2022 1639      Component Value Date/Time   CALCIUM 8.8 (L) 02/24/2023 1058   ALKPHOS 64 02/24/2023 1058   AST 27 02/24/2023 1058   ALT 35 02/24/2023 1058   BILITOT 0.5 02/24/2023 1058       RADIOGRAPHIC STUDIES: No results found.  ASSESSMENT AND PLAN: Assessment and Plan    Elevated Monocytes Elevated monocytes noted on recent blood work: 1199 in August 2024,  1074 in November 2024, and currently 900. Likely secondary to inflammation from a recent left leg skin infection. No signs of leukemia or lymphoma. White blood count slightly elevated at 10.6 (normal up to 10.5). Discussed that elevated monocytes can be due to inflammation. Further testing for BCR-ABL is not necessary as current levels are normal. - Repeat blood work today - Cancel BCR-ABL test - Follow up with primary care physician - Return if symptoms worsen or new symptoms develop  Chronic Obstructive Pulmonary Disease (COPD) COPD, currently asymptomatic. Patient is using a nicotine patch to quit smoking. - Continue current COPD management - Avoid smoking - Continue nicotine patch for smoking cessation  Diabetes Mellitus Diabetes mellitus, previously on Trulicity but discontinued due to diarrhea. Weight increased from 240 lbs to 257 lbs after stopping Trulicity. Need to explore alternative diabetes management options with primary care physician. -  Monitor blood glucose levels - Discuss alternative diabetes management with primary care physician  Hypertension Hypertension, well-managed. No current symptoms reported. - Continue current antihypertensive medications - Monitor blood pressure regularly  Hyperlipidemia Hyperlipidemia, well-managed. No current symptoms reported. - Continue current lipid-lowering therapy - Monitor lipid levels regularly  General Health Maintenance No alcohol or drug abuse reported. Allergic to codeine and morphine. Follow up with gastroenterologist for Cologuard results in February. - Follow up with gastroenterologist for Cologuard results in February  Follow-up - Follow up with primary care physician - Return if symptoms worsen or new symptoms develop.   The patient was advised to call immediately if she has any other concerning symptoms in the interval.   The patient voices understanding of current disease status and treatment options and is in  agreement with the current care plan.  All questions were answered. The patient knows to call the clinic with any problems, questions or concerns. We can certainly see the patient much sooner if necessary.  Thank you so much for allowing me to participate in the care of [name]. I will continue to follow up the patient with you and assist in his care.  The total time spent in the appointment was 60 minutes.  Disclaimer: This note was dictated with voice recognition software. Similar sounding words can inadvertently be transcribed and may not be corrected upon review.   Lajuana Matte February 24, 2023, 12:04 PM

## 2023-03-04 ENCOUNTER — Telehealth: Payer: Self-pay

## 2023-03-04 ENCOUNTER — Encounter: Payer: Self-pay | Admitting: Gastroenterology

## 2023-03-04 ENCOUNTER — Ambulatory Visit (INDEPENDENT_AMBULATORY_CARE_PROVIDER_SITE_OTHER): Payer: Commercial Managed Care - HMO | Admitting: Gastroenterology

## 2023-03-04 ENCOUNTER — Encounter: Payer: Self-pay | Admitting: *Deleted

## 2023-03-04 VITALS — BP 116/62 | HR 68 | Ht 74.0 in | Wt 258.0 lb

## 2023-03-04 DIAGNOSIS — R195 Other fecal abnormalities: Secondary | ICD-10-CM

## 2023-03-04 DIAGNOSIS — B1920 Unspecified viral hepatitis C without hepatic coma: Secondary | ICD-10-CM

## 2023-03-04 DIAGNOSIS — I639 Cerebral infarction, unspecified: Secondary | ICD-10-CM

## 2023-03-04 MED ORDER — SUFLAVE 178.7 G PO SOLR: 1.0000 | Freq: Once | ORAL | 0 refills | Status: DC

## 2023-03-04 MED ORDER — SUFLAVE 178.7 G PO SOLR: 1.0000 | Freq: Once | ORAL | 0 refills | Status: AC

## 2023-03-04 NOTE — Progress Notes (Signed)
Chief Complaint: Positive Cologuard Primary GI MD: Gentry Fitz  HPI: 63 year old male history of type 2 diabetes, hypertension, hyperlipidemia, anxiety, GERD, tobacco use, COPD, R BTK amputation, CVA (on Plavix) 05/2021, presents for evaluation of positive Cologuard.  Seen by PCP and underwent Cologuard which was positive.  No previous colonoscopy.  Reported history of hepatitis C with previous treatment  Patient was recently seen by heme-onc for elevated monocytes thought to be secondary from skin infection  Labs 02/24/2023 Normal CMP Normal CBC  Patient states he denies GI symptoms.  Denies weight loss, rectal bleeding, change in bowel habits, nausea, vomiting, abdominal pain.  Denies family history of colon cancer.  Remote ports a remote colonoscopy greater than 30 years ago done at [facility] due to diverticulitis.  He cannot remember what this showed.   PREVIOUS GI WORKUP   Echocardiogram 04/2021 with EF 60 to 65%  Past Medical History:  Diagnosis Date   Diabetic neuropathy ([name])    Diverticulitis    History of hepatitis C    has been treated in the past   History of kidney stones    Hyperlipidemia    Hypertension    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[name])    Other testicular hypofunction    Severe sepsis ([name]) 08/26/2019   Type II or unspecified type diabetes mellitus without mention of complication, not stated as uncontrolled    Vitamin D deficiency     Past Surgical History:  Procedure Laterality Date   AMPUTATION Right 09/01/2019   Procedure: RIGHT BELOW KNEE AMPUTATION;  Surgeon: Nadara Mustard, MD;  Location: [facility] OR;  Service: Orthopedics;  Laterality: Right;   CHOLECYSTECTOMY  1987   COLONOSCOPY     I & D EXTREMITY Right 08/20/2019   Procedure: IRRIGATION & DEBRIDEMENT RIGHT FOOT PARTIAL CALCANEAL EXCISION;  Surgeon: Nadara Mustard, MD;  Location: [facility] OR;  Service: Orthopedics;  Laterality: Right;   I & D EXTREMITY Right 08/27/2019   Procedure: IRRIGATION AND  DEBRIDEMENT  OF FOOT;  Surgeon: Tarry Kos, MD;  Location: [facility] OR;  Service: Orthopedics;  Laterality: Right;   ORIF TIBIA FRACTURE     x 3 right leg    Current Outpatient Medications  Medication Sig Dispense Refill   cetirizine (ZYRTEC) 10 MG tablet Take 10 mg by mouth daily as needed for allergies.     Cholecalciferol (VITAMIN D) 50 MCG (2000 UT) CAPS Take 2,000 Units by mouth 2 (two) times daily.     citalopram (CELEXA) 40 MG tablet TAKE 1 TABLET BY MOUTH ONCE DAILY FOR MOOD AND CHRONIC ANXIETY 90 tablet 2   clopidogrel (PLAVIX) 75 MG tablet Take  1 tablet  Daily  to Prevent Blood Clots & Strokes 90 tablet 3   cyanocobalamin (VITAMIN B12) 500 MCG tablet Take 5,000 mcg by mouth daily. Pt is taking one tablet twice weekly     hydrochlorothiazide (HYDRODIURIL) 25 MG tablet TAKE ONE TABLET BY MOUTH DAILY FOR BLOOD PRESSURE,FLUID RETENTION,ANKLE SWELLING (Patient taking differently: Pt is taking 1/2 TABLET BY MOUTH DAILY FOR BLOOD PRESSURE,FLUID RETENTION,ANKLE SWELLING) 30 tablet 11   lisinopril (ZESTRIL) 10 MG tablet Take 1 tablet (10 mg total) by mouth daily. 30 tablet 11   metFORMIN (GLUCOPHAGE-XR) 500 MG 24 hr tablet Take  2 tablets  2 x / day  with Meals  for Diabetes                                             /  TAKE                                         BY                                                 MOUTH 360 tablet 3   pantoprazole (PROTONIX) 40 MG tablet Take  1 tablet   Daily   to Prevent Heartburn & Indigestion 90 tablet 3   rosuvastatin (CRESTOR) 40 MG tablet TAKE ONE TABLET BY MOUTH DAILY FOR CHOLESTEROL 30 tablet 11   Study - OCEANIC-STROKE - asundexian 50 mg or placebo tablet (PI-Sethi) Take 1 tablet (50 mg total) by mouth daily. For investigational use only. Take 1 tablet by mouth once daily at the same time each day, preferably in the morning. Please bring bottle to every visit. 196 tablet 0   verapamil  (CALAN-SR) 240 MG CR tablet Take 1 tablet  Daily with a Meal for BP & Heart Rhythm                                                             /                                                                   TAKE                                         BY                                                 MOUTH 90 tablet 3   Zinc 50 MG TABS Take by mouth.     No current facility-administered medications for this visit.    Allergies as of 03/04/2023 - Review Complete 03/04/2023  Allergen Reaction Noted   Invokana [canagliflozin] Other (See Comments) 09/25/2016   Codeine camsylate [codeine] Rash 02/03/2013   Morphine and codeine Rash 07/19/2010    Family History  Problem Relation Age of Onset   Hypertension Mother    Cancer Father        colon   Alzheimer's disease Father    Stroke Father     Social History   Socioeconomic History   Marital status: Married    Spouse name: [name]   Number of children: 0   Years of education: Not on file   Highest education level: High school graduate  Occupational History   Not on file  Tobacco Use   Smoking status: Some Days    Current packs/day: 0.00    Types: Cigarettes  Last attempt to quit: 05/07/2021    Years since quitting: 1.8   Smokeless tobacco: Never  Vaping Use   Vaping status: Never Used  Substance and Sexual Activity   Alcohol use: No   Drug use: Yes    Types: Marijuana   Sexual activity: Not on file  Other Topics Concern   Not on file  Social History Narrative   Lives with wife teresa   L handed   Caffeine: 2 C of coffee a day and 2-3 diet mth dews a day   Social Drivers of Corporate investment banker Strain: Not on file  Food Insecurity: Not on file  Transportation Needs: Not on file  Physical Activity: Not on file  Stress: Not on file  Social Connections: Not on file  Intimate Partner Violence: Not on file    Review of Systems:    Constitutional: No weight loss, fever, chills, weakness or fatigue HEENT:  Eyes: No change in vision               Ears, Nose, Throat:  No change in hearing or congestion Skin: No rash or itching Cardiovascular: No chest pain, chest pressure or palpitations   Respiratory: No SOB or cough Gastrointestinal: See HPI and otherwise negative Genitourinary: No dysuria or change in urinary frequency Neurological: No headache, dizziness or syncope Musculoskeletal: No new muscle or joint pain Hematologic: No bleeding or bruising Psychiatric: No history of depression or anxiety    Physical Exam:  Vital signs: BP 116/62 (BP Location: Left Arm, Patient Position: Sitting, Cuff Size: Normal)   Pulse 68   Ht 6\' 2"  (1.88 m)   Wt 258 lb (117 kg)   SpO2 97%   BMI 33.13 kg/m   Constitutional: NAD, Well developed, Well nourished, alert and cooperative Head:  Normocephalic and atraumatic. Eyes:   PEERL, EOMI. No icterus. Conjunctiva pink. Respiratory: Respirations even and unlabored. Lungs clear to auscultation bilaterally.   No wheezes, crackles, or rhonchi.  Cardiovascular:  Regular rate and rhythm. No peripheral edema, cyanosis or pallor.  Gastrointestinal:  Soft, nondistended, nontender. No rebound or guarding. Normal bowel sounds. No appreciable masses or hepatomegaly. Rectal:  Not performed.  Msk:  R BK Amputation Neurologic:  Alert and  oriented x4;  grossly normal neurologically.  Skin:   Dry and intact without significant lesions or rashes. Psychiatric: Oriented to person, place and time. Demonstrates good judgement and reason without abnormal affect or behaviors.    RELEVANT LABS AND IMAGING: CBC    Component Value Date/Time   WBC 10.6 (H) 02/24/2023 1058   WBC 13.1 (H) 12/12/2022 1639   RBC 4.62 02/24/2023 1058   HGB 14.3 02/24/2023 1058   HCT 42.6 02/24/2023 1058   PLT 167 02/24/2023 1058   MCV 92.2 02/24/2023 1058   MCH 31.0 02/24/2023 1058   MCHC 33.6 02/24/2023 1058   RDW 14.2 02/24/2023 1058   LYMPHSABS 3.0 02/24/2023 1058   MONOABS 0.9  02/24/2023 1058   EOSABS 0.1 02/24/2023 1058   BASOSABS 0.1 02/24/2023 1058    CMP     Component Value Date/Time   NA 135 02/24/2023 1058   K 4.4 02/24/2023 1058   CL 100 02/24/2023 1058   CO2 27 02/24/2023 1058   GLUCOSE 174 (H) 02/24/2023 1058   BUN 20 02/24/2023 1058   CREATININE 0.96 02/24/2023 1058   CREATININE 0.91 12/12/2022 1639   CALCIUM 8.8 (L) 02/24/2023 1058   PROT 6.9 02/24/2023 1058   ALBUMIN 3.9 02/24/2023 1058  AST 27 02/24/2023 1058   ALT 35 02/24/2023 1058   ALKPHOS 64 02/24/2023 1058   BILITOT 0.5 02/24/2023 1058   GFRNONAA >60 02/24/2023 1058   GFRNONAA 85 07/19/2020 1458   GFRAA 98 07/19/2020 1458     Assessment/Plan:   Positive Cologuard Recent positive Cologuard.  Currently no GI symptoms.  Remote colonoscopy greater than 30 years ago reportedly due to diverticulitis(?), this report is not available. Diverticulitis noted in 2017 per imaging.  - Schedule colonoscopy in [facility] - I thoroughly discussed the procedure with the patient (at bedside) to include nature of the procedure, alternatives, benefits, and risks (including but not limited to bleeding, infection, perforation, anesthesia/cardiac pulmonary complications).  Patient verbalized understanding and gave verbal consent to proceed with procedure. - Will hold Plavix 5 days prior to endoscopic procedures - will instruct when and how to resume after procedure. Benefits and risks of procedure explained including risks of bleeding, perforation, infection, missed lesions, reactions to medications and possible need for hospitalization and surgery for complications. Additional rare but real risk of stroke or other vascular clotting events off Plavix also explained and need to seek urgent help if any signs of these problems occur. Will communicate by phone or EMR with patient's  prescribing provider to confirm that holding Plavix is reasonable in this case.    CVA In 2023, currently on plavix.  History of HCV  s/p treatment Patient reports he has been treated with clearance "many years ago."  Recent LFTs were normal.  CT abdomen from 2017 showed normal liver.  HCV quantitative RNA from November 2016 was not detected  Assigned to Dr. Myrtie Neither today  Boone Master, PA-C [facility] 03/04/2023, 2:35 PM  Cc: Lucky Cowboy, MD

## 2023-03-04 NOTE — Patient Instructions (Addendum)
You have been scheduled for a colonoscopy. Please follow written instructions given to you at your visit today.   If you use inhalers (even only as needed), please bring them with you on the day of your procedure.  DO NOT TAKE 7 DAYS PRIOR TO TEST- Trulicity (dulaglutide) Ozempic, Wegovy (semaglutide) Mounjaro (tirzepatide) Bydureon Bcise (exanatide extended release)  DO NOT TAKE 1 DAY PRIOR TO YOUR TEST Rybelsus (semaglutide) Adlyxin (lixisenatide) Victoza (liraglutide) Byetta (exanatide) ___________________________________________________________________________  Bonita Quin will receive your bowel preparation through [facility], which ensures the lowest copay and home delivery, with outreach via text or call from an [phone] number. Please respond promptly to avoid rescheduling of your procedure. If you are interested in alternative options or have any questions regarding your prep, please contact them at [phone] ____________________________________________________________________________  Your Provider Has Sent Your Bowel Prep Regimen To [facility]   [facility] will contact you to verify your information and collect your copay, if applicable. Enjoy the comfort of your home while your prescription is mailed to you, FREE of any shipping charges.   [facility] accepts all major insurance benefits and applies discounts & coupons.  Have additional questions?   Chat: [email or web address] Call: [phone] Email: [email or web address] NCPDP: [id number]  How will [facility] contact you?  With a Welcome phone call,  a Welcome text and a checkout link in text form.  Texts you receive from [phone] Are NOT Spam.  *To set up delivery, you must complete the checkout process via link or speak to one of the patient care representatives. If [facility] is unable to reach you, your prescription may be delayed.  To avoid long hold times on the phone, you may also utilize the secure chat  feature on the [facility] website to request that they call you back for transaction completion or to expedite your concerns.  You will receive your bowel preparation through [facility], which ensures the lowest copay and home delivery, with outreach via text or call from an [phone] number. Please respond promptly to avoid rescheduling. If you are interested in alternative options or have any questions please contact them at [phone]  Your Provider Has Sent Your Bowel Prep Regimen To [facility] What to expect. [facility] will contact you to verify your information and collect your copay, if applicable. Enjoy the comfort of your home while we deliver your prescription to you, free of any shipping charges. Fast, FREE delivery or shipping. [facility] accepts all major insurance benefits and applies discounts & coupons  Have additional questions? [facility]'s patient care team is always here to help.  Chat: [email or web address] Call: [phone] Email: [email or web address] NCPDP: [id number] How will we contact you? Welcome Phone call  a Welcome text and a Checkout link in a text Texts you receive from [phone] Are Not Spam.   *To set up delivery, you must complete the checkout process via link or speak to one of our patient care representatives. If we are unable to reach you, your prescription may be delayed.  To avoid waiting on hold if you call. Utilize the secure chat feature and request [facility] call you to complete the transaction or expedite your concerns.   ;_______________________________________________________  If your blood pressure at your visit was 140/90 or greater, please contact your primary care physician to follow up on this.  _______________________________________________________  If you are age 65 or older, your body mass index should be between 23-30. Your Body mass index is 33.13 kg/m. If this is out of the aforementioned range listed, please consider  follow  up with your Primary Care Provider.  If you are age 65 or younger, your body mass index should be between 19-25. Your Body mass index is 33.13 kg/m. If this is out of the aformentioned range listed, please consider follow up with your Primary Care Provider.   ________________________________________________________  The [facility] providers would like to encourage you to use [facility] to communicate with providers for non-urgent requests or questions.  Due to long hold times on the telephone, sending your provider a message by [facility] may be a faster and more efficient way to get a response.  Please allow 48 business hours for a response.  Please remember that this is for non-urgent requests.  _______________________________________________________   Due to recent changes in healthcare laws, you may see the results of your imaging and laboratory studies on MyChart before your provider has had a chance to review them.  We understand that in some cases there may be results that are confusing or concerning to you. Not all laboratory results come back in the same time frame and the provider may be waiting for multiple results in order to interpret others.  Please give [place] 48 hours in order for your provider to thoroughly review all the results before contacting the office for clarification of your results.   Thank you for entrusting me with your care and choosing [facility].  Bayley Leanna Sato, PA-C

## 2023-03-04 NOTE — Telephone Encounter (Signed)
No additional notes

## 2023-03-05 NOTE — Progress Notes (Signed)
____________________________________________________________  Attending physician addendum:  Thank you for sending this case to me. I have reviewed the entire note and agree with the plan.   Amada Jupiter, MD  ____________________________________________________________

## 2023-03-17 ENCOUNTER — Ambulatory Visit: Payer: Self-pay | Admitting: Internal Medicine

## 2023-03-25 ENCOUNTER — Telehealth: Payer: Self-pay

## 2023-03-25 ENCOUNTER — Telehealth: Payer: Self-pay | Admitting: Gastroenterology

## 2023-03-25 NOTE — Telephone Encounter (Signed)
 Spoke to patient wife had to reschedule procedure patient cardiologist past away he is schedule to be seen with a new doctor on 04/23/23 will send new clearance to new doctor after patient visit. Patient and wife is okay with rescheduling procedure.

## 2023-03-25 NOTE — Telephone Encounter (Signed)
 Inbound call from patient's wife, would like to speak to [facility] in regards to Plavix. State she is returning her call. State his previous cardiologist passed away and his appointment isnt until April with his new provider.

## 2023-03-26 ENCOUNTER — Telehealth: Payer: Self-pay

## 2023-04-15 ENCOUNTER — Encounter: Payer: 59 | Admitting: Gastroenterology

## 2023-04-23 ENCOUNTER — Encounter: Payer: Self-pay | Admitting: General Practice

## 2023-04-23 ENCOUNTER — Other Ambulatory Visit: Payer: Self-pay | Admitting: General Practice

## 2023-04-23 ENCOUNTER — Encounter: Payer: Self-pay | Admitting: Internal Medicine

## 2023-04-23 ENCOUNTER — Telehealth: Payer: Self-pay | Admitting: Family Medicine

## 2023-04-23 ENCOUNTER — Ambulatory Visit: Payer: 59 | Admitting: General Practice

## 2023-04-23 VITALS — BP 116/42 | HR 63 | Temp 98.2°F | Ht 73.5 in | Wt 259.0 lb

## 2023-04-23 DIAGNOSIS — E1169 Type 2 diabetes mellitus with other specified complication: Secondary | ICD-10-CM

## 2023-04-23 DIAGNOSIS — L02611 Cutaneous abscess of right foot: Secondary | ICD-10-CM

## 2023-04-23 DIAGNOSIS — R4701 Aphasia: Secondary | ICD-10-CM | POA: Diagnosis not present

## 2023-04-23 DIAGNOSIS — I10 Essential (primary) hypertension: Secondary | ICD-10-CM | POA: Diagnosis not present

## 2023-04-23 DIAGNOSIS — F419 Anxiety disorder, unspecified: Secondary | ICD-10-CM | POA: Insufficient documentation

## 2023-04-23 DIAGNOSIS — R195 Other fecal abnormalities: Secondary | ICD-10-CM | POA: Diagnosis not present

## 2023-04-23 DIAGNOSIS — J449 Chronic obstructive pulmonary disease, unspecified: Secondary | ICD-10-CM

## 2023-04-23 DIAGNOSIS — I6330 Cerebral infarction due to thrombosis of unspecified cerebral artery: Secondary | ICD-10-CM

## 2023-04-23 DIAGNOSIS — Z7689 Persons encountering health services in other specified circumstances: Secondary | ICD-10-CM | POA: Insufficient documentation

## 2023-04-23 DIAGNOSIS — E785 Hyperlipidemia, unspecified: Secondary | ICD-10-CM

## 2023-04-23 DIAGNOSIS — Z7984 Long term (current) use of oral hypoglycemic drugs: Secondary | ICD-10-CM

## 2023-04-23 MED ORDER — CLOPIDOGREL BISULFATE 75 MG PO TABS
ORAL_TABLET | ORAL | 2 refills | Status: DC
Start: 2023-04-23 — End: 2023-07-21

## 2023-04-23 MED ORDER — MUPIROCIN 2 % EX OINT: 1.0000 | TOPICAL_OINTMENT | Freq: Every day | CUTANEOUS | Status: AC | PRN

## 2023-04-23 NOTE — Assessment & Plan Note (Signed)
 BP slightly below goal today.  He has taken his medication.   Continue lisinopril 10 mg once daily and hydrochlorothiazide 12.5 mg once daily. He does increase hydrochlorothiazide to 25 mg if he develops any edema.   Continue Verapamil 240 mg once daily for irregular heartbeat.  Does not see cardiology.  No concerns.   Follow up in 6 months.

## 2023-04-23 NOTE — Telephone Encounter (Signed)
 PCP refilled 04/23/23 for pt.

## 2023-04-23 NOTE — Assessment & Plan Note (Signed)
 Controlled.  Uses Bactroban as needed.  Refill sent.

## 2023-04-23 NOTE — Patient Instructions (Addendum)
 Call Neurologist and ask if she is ok with approving the pause on plavix for the colonoscopy. Please keep me updated on the answer.   Refill sent for plavix.   Follow up in one week for diabetes, plavix and amputation.   It was a pleasure meeting you!

## 2023-04-23 NOTE — Assessment & Plan Note (Signed)
Controlled. Continue Celexa.

## 2023-04-23 NOTE — Assessment & Plan Note (Signed)
 Currently on Plavix 75 mg once daily and Crestor 40 mg once daily.   Refill sent for Plavix.   Following with neurology as needed.

## 2023-04-23 NOTE — Assessment & Plan Note (Signed)
 Positive cologuard in October.   Following with cardiology   Scheduled for colonoscopy on 06/10/23.

## 2023-04-23 NOTE — Assessment & Plan Note (Signed)
 Improving.

## 2023-04-23 NOTE — Progress Notes (Signed)
 New Patient Office Visit  Subjective    Patient ID: [name], male    DOB: [birth year]  Age: 63 y.o. MRN: [id number]  CC:  Chief Complaint  Patient presents with  . New Patient (Initial Visit)    Colonoscopy is scheduled for 05/23/23    HPI [name] is a 63 y.o. male presents to establish care.   His wife, [name], is also present today.   Previous PCP/physical/labs: Dr. Oneta Rack   HTN/HLD/Frequent unifocal PVCs- currently managed on lisinopril 10 mg once daily, rosuvastatin 40 mg once daily and verapamil 240 mg once daily.   Positive cologuard- He completed a cologuard on 11/05/22 which was positive. He was referred to GI for a colonoscopy. He is scheduled for a colonoscopy on 05/23/23. He would need to take a pause on plavix for five days prior to the procedure. He will confirm with neurologist.  Hx of CVA: May 09, 2021. He had some word deficit but it has been improving overall. His deficit did affect his work and he ended up retiring in 2024. Currently on Plavix and Crestor 40 mg once daily. Followed by neurology. He reports that he did have to take a pause on plavix for five days or the dentist procedure on 01/27/23 and restarted it two days after his procedure. He was off of it for 7 days. He needs a refill on Plavix.   COPD: currently using nicotine patch to help quit smoking. He is still smoking but has cut back. He has been using the nicotine patches daily.   Anxiety- currently managed on Celexa 40 mg once daily. Has been effective. He is not currently in therapy. His PCP was filling his medications.   Elevated monocytes- saw oncology and had the work up done. Was told that there was no cancer. He was asked to follow up with PCP.   Outpatient Encounter Medications as of 04/23/2023  Medication Sig  . cetirizine (ZYRTEC) 10 MG tablet Take 10 mg by mouth daily as needed for allergies.  . Cholecalciferol (VITAMIN D) 50 MCG (2000 UT) CAPS Take 2,000 Units by mouth 2 (two)  times daily.  . citalopram (CELEXA) 40 MG tablet TAKE 1 TABLET BY MOUTH ONCE DAILY FOR MOOD AND CHRONIC ANXIETY  . clopidogrel (PLAVIX) 75 MG tablet Take  1 tablet  Daily  to Prevent Blood Clots & Strokes  . cyanocobalamin (VITAMIN B12) 500 MCG tablet Take 5,000 mcg by mouth daily. Pt is taking one tablet twice weekly  . hydrochlorothiazide (HYDRODIURIL) 25 MG tablet TAKE ONE TABLET BY MOUTH DAILY FOR BLOOD PRESSURE,FLUID RETENTION,ANKLE SWELLING (Patient taking differently: Pt is taking 1/2 TABLET BY MOUTH DAILY FOR BLOOD PRESSURE,FLUID RETENTION,ANKLE SWELLING)  . lisinopril (ZESTRIL) 10 MG tablet Take 1 tablet (10 mg total) by mouth daily.  . metFORMIN (GLUCOPHAGE-XR) 500 MG 24 hr tablet Take  2 tablets  2 x / day  with Meals  for Diabetes                                             /  TAKE                                         BY                                                 MOUTH  . mupirocin ointment (BACTROBAN) 2 % Apply 1 Application topically daily as needed.  . pantoprazole (PROTONIX) 40 MG tablet Take  1 tablet   Daily   to Prevent Heartburn & Indigestion  . rosuvastatin (CRESTOR) 40 MG tablet TAKE ONE TABLET BY MOUTH DAILY FOR CHOLESTEROL  . Study - OCEANIC-STROKE - asundexian 50 mg or placebo tablet (PI-Sethi) Take 1 tablet (50 mg total) by mouth daily. For investigational use only. Take 1 tablet by mouth once daily at the same time each day, preferably in the morning. Please bring bottle to every visit.  Marland Kitchen verapamil (CALAN-SR) 240 MG CR tablet Take 1 tablet  Daily with a Meal for BP & Heart Rhythm                                                             /                                                                   TAKE                                         BY                                                 MOUTH  . Zinc 50 MG TABS Take by mouth.  . [DISCONTINUED] clopidogrel (PLAVIX) 75 MG tablet Take  1 tablet   Daily  to Prevent Blood Clots & Strokes   No facility-administered encounter medications on file as of 04/23/2023.    Past Medical History:  Diagnosis Date  . Diabetic neuropathy ([name])   . Diverticulitis   . GERD (gastroesophageal reflux disease)   . History of hepatitis C    has been treated in the past  . History of kidney stones   . Hyperlipidemia   . Hypertension   . 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[name])   . Other testicular hypofunction   . Severe sepsis ([name]) 08/26/2019  . Stroke ([name])   . Type II or unspecified type diabetes mellitus without mention of complication, not stated as uncontrolled   . Vitamin D deficiency     Past Surgical History:  Procedure Laterality Date  . AMPUTATION Right 09/01/2019   Procedure: RIGHT BELOW KNEE AMPUTATION;  Surgeon:  Nadara Mustard, MD;  Location: [facility] OR;  Service: Orthopedics;  Laterality: Right;  . CHOLECYSTECTOMY  1987  . COLONOSCOPY    . I & D EXTREMITY Right 08/20/2019   Procedure: IRRIGATION & DEBRIDEMENT RIGHT FOOT PARTIAL CALCANEAL EXCISION;  Surgeon: Nadara Mustard, MD;  Location: [facility] OR;  Service: Orthopedics;  Laterality: Right;  . I & D EXTREMITY Right 08/27/2019   Procedure: IRRIGATION AND DEBRIDEMENT  OF FOOT;  Surgeon: Tarry Kos, MD;  Location: [facility] OR;  Service: Orthopedics;  Laterality: Right;  . ORIF TIBIA FRACTURE     x 3 right leg    Family History  Problem Relation Age of Onset  . Diabetes Mother   . Hypertension Mother   . Cancer Father        colon  . Alzheimer's disease Father   . Stroke Father   . Diabetes Sister     Social History   Socioeconomic History  . Marital status: Married    Spouse name: [name]  . Number of children: 0  . Years of education: Not on file  . Highest education level: High school graduate  Occupational History  . Not on file  Tobacco Use  . Smoking status: Some Days    Current packs/day: 0.00    Types: Cigarettes    Last attempt to quit: 05/07/2021    Years since quitting:  1.9  . Smokeless tobacco: Never  Vaping Use  . Vaping status: Never Used  Substance and Sexual Activity  . Alcohol use: No  . Drug use: Yes    Types: Marijuana  . Sexual activity: Yes  Other Topics Concern  . Not on file  Social History Narrative   Lives with wife teresa   L handed   Caffeine: 2 C of coffee a day and 2-3 diet mth dews a day   Social Drivers of Corporate investment banker Strain: Not on file  Food Insecurity: Not on file  Transportation Needs: Not on file  Physical Activity: Not on file  Stress: Not on file  Social Connections: Not on file  Intimate Partner Violence: Not on file    Review of Systems  Constitutional:  Negative for chills and fever.  Respiratory:  Negative for shortness of breath.   Cardiovascular:  Negative for chest pain.  Gastrointestinal:  Negative for abdominal pain, constipation, diarrhea, heartburn, nausea and vomiting.  Genitourinary:  Negative for dysuria, frequency and urgency.  Neurological:  Negative for dizziness and headaches.  Endo/Heme/Allergies:  Negative for polydipsia.  Psychiatric/Behavioral:  Negative for depression and suicidal ideas. The patient is not nervous/anxious.         Objective    BP (!) 116/42 (BP Location: Left Arm, Patient Position: Sitting, Cuff Size: Normal)   Pulse 63   Temp 98.2 F (36.8 C) (Oral)   Ht 6' 1.5" (1.867 m)   Wt 259 lb (117.5 kg)   SpO2 97%   BMI 33.71 kg/m   Physical Exam Vitals and nursing note reviewed.  Constitutional:      Appearance: Normal appearance.  Cardiovascular:     Rate and Rhythm: Normal rate and regular rhythm.     Pulses: Normal pulses.     Heart sounds: Normal heart sounds.  Pulmonary:     Effort: Pulmonary effort is normal.     Breath sounds: Normal breath sounds.  Neurological:     Mental Status: He is alert and oriented to person, place, and time.  Psychiatric:  Mood and Affect: Mood normal.        Behavior: Behavior normal.        Thought  Content: Thought content normal.        Judgment: Judgment normal.        Assessment & Plan:  Positive colorectal cancer screening using Cologuard test Assessment & Plan: Positive cologuard in October.   Following with cardiology   Scheduled for colonoscopy on 06/10/23.   Establishing care with new doctor, encounter for Assessment & Plan: EMR reviewed briefly.   Cerebral thrombosis with cerebral infarction [facility]) Assessment & Plan: Currently on Plavix 75 mg once daily and Crestor 40 mg once daily.   Refill sent for Plavix.   Following with neurology as needed.  Orders: -     Clopidogrel Bisulfate; Take  1 tablet  Daily  to Prevent Blood Clots & Strokes  Dispense: 30 tablet; Refill: 2  Aphasia Assessment & Plan: Improving.   Essential hypertension Assessment & Plan: BP slightly below goal today.  He has taken his medication.   Continue lisinopril 10 mg once daily and hydrochlorothiazide 12.5 mg once daily. He does increase hydrochlorothiazide to 25 mg if he develops any edema.   Continue Verapamil 240 mg once daily for irregular heartbeat.  Does not see cardiology.  No concerns.   Follow up in 6 months.   Hyperlipidemia associated with type 2 diabetes mellitus ([name]) Assessment & Plan: Continue crestor 40 mg once daily.   Cutaneous abscess of right foot Assessment & Plan: Controlled.  Uses Bactroban as needed.  Refill sent.  Orders: -     Mupirocin; Apply 1 Application topically daily as needed.  Chronic obstructive pulmonary disease, unspecified COPD type ([name]) Assessment & Plan: Controlled.   Continue off meds.  Discussed smoking cessation. Has bene using patch. Has cut down.  Continue to monitor.   Anxiety Assessment & Plan: Controlled.   Continue Celexa.     Return in about 1 week (around 04/30/2023) for diabetes, plavix, amputation.   [place]esto Charon, NP

## 2023-04-23 NOTE — Assessment & Plan Note (Signed)
 Controlled.   Continue off meds.  Discussed smoking cessation. Has bene using patch. Has cut down.  Continue to monitor.

## 2023-04-23 NOTE — Telephone Encounter (Signed)
 Pt's wife, Keona Sheffler said Dr. Evelene Croon advise to call neurologist for surgical clearance to stop clopidogrel (PLAVIX) 75 MG tablet for colonoscopy.  Informed Ms. Kell patient has not be seen since 2023. Would need to get clearance from physician prescribed the medicaiton.

## 2023-04-23 NOTE — Assessment & Plan Note (Signed)
 EMR reviewed briefly.

## 2023-04-23 NOTE — Assessment & Plan Note (Signed)
 Continue crestor 40 mg once daily.

## 2023-04-25 ENCOUNTER — Telehealth: Payer: Self-pay

## 2023-04-25 NOTE — Telephone Encounter (Signed)
 No additional notes

## 2023-04-25 NOTE — Telephone Encounter (Signed)
  ROMA BIERLEIN 08-Jul-1960 [id number]     Dear Evelene Croon NP:  We have scheduled the above named patient for a Colonoscopy procedure. Our records show that he is on anticoagulation therapy.  Please advise as to whether the patient may come off their therapy of Plavix 5 days prior to their procedure which is scheduled for 05/23/23.  Please route your response to [facility] or fax response to [phone].  Sincerely,    [facility]

## 2023-04-29 NOTE — Progress Notes (Unsigned)
 Established Patient Office Visit  Subjective   Patient ID: [name], male    DOB: May 29, 1960  Age: 63 y.o. MRN: [id number]  No chief complaint on file.   HPI  [name] is a 63 year old male with past medical history of , presents today for a follow up on chronic conditions.   Type 2 DM:   Amputation:   Colonoscopy clearance:  Patient Active Problem List   Diagnosis Date Noted   Establishing care with new doctor, encounter for 04/23/2023   Positive colorectal cancer screening using Cologuard test 04/23/2023   Anxiety 04/23/2023   Cerebral thrombosis with cerebral infarction 05/09/2021   Aphasia 05/08/2021   Leukocytosis 05/08/2021   Right below-knee amputee ([name]) 09/03/2019   Hyponatremia 08/26/2019   Cutaneous abscess of right foot    Poorly controlled diabetes mellitus ([name]) 07/16/2019   FHx: heart disease 10/08/2017   Type 2 diabetes mellitus with stage 2 chronic kidney disease, without long-term current use of insulin ([name]) 10/08/2017   Insomnia 07/08/2017   Smoker 08/17/2015   COPD (chronic obstructive pulmonary disease) ([name]) 08/17/2015   T2_NIDDM w/Peripheral Neuropathy 04/01/2014   CKD stage 2 due to type 2 diabetes mellitus ([name]) 08/31/2013   Medication management 05/19/2013   Morbid obesity (BMI 35) 05/19/2013   Essential hypertension    Hyperlipidemia associated with type 2 diabetes mellitus ([name])    Vitamin D deficiency    History of kidney stones    Testosterone deficiency    History of hepatitis C    Past Medical History:  Diagnosis Date   Diabetic neuropathy ([name])    Diverticulitis    GERD (gastroesophageal reflux disease)    History of hepatitis C    has been treated in the past   History of kidney stones    Hyperlipidemia    Hypertension    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[name])    Other testicular hypofunction    Severe sepsis ([name]) 08/26/2019   Stroke ([name])    Type II or unspecified type diabetes mellitus without mention of  complication, not stated as uncontrolled    Vitamin D deficiency    Past Surgical History:  Procedure Laterality Date   AMPUTATION Right 09/01/2019   Procedure: RIGHT BELOW KNEE AMPUTATION;  Surgeon: Nadara Mustard, MD;  Location: [facility] OR;  Service: Orthopedics;  Laterality: Right;   CHOLECYSTECTOMY  1987   COLONOSCOPY     I & D EXTREMITY Right 08/20/2019   Procedure: IRRIGATION & DEBRIDEMENT RIGHT FOOT PARTIAL CALCANEAL EXCISION;  Surgeon: Nadara Mustard, MD;  Location: [facility] OR;  Service: Orthopedics;  Laterality: Right;   I & D EXTREMITY Right 08/27/2019   Procedure: IRRIGATION AND DEBRIDEMENT  OF FOOT;  Surgeon: Tarry Kos, MD;  Location: [facility] OR;  Service: Orthopedics;  Laterality: Right;   ORIF TIBIA FRACTURE     x 3 right leg   Allergies  Allergen Reactions   Invokana [Canagliflozin] Other (See Comments)    Extremity edema/caused pain   Codeine Camsylate [Codeine] Rash   Morphine And Codeine Rash         04/23/2023   11:42 AM 09/10/2022   12:11 AM 08/20/2021    8:54 PM  Depression screen PHQ 2/9  Decreased Interest 0 0 0  Down, Depressed, Hopeless 0 0 0  PHQ - 2 Score 0 0 0  Altered sleeping 1    Tired, decreased energy 1    Change in appetite 0    Feeling bad  or failure about yourself  1    Trouble concentrating 0    Moving slowly or fidgety/restless 0    Suicidal thoughts 0    PHQ-9 Score 3    Difficult doing work/chores Somewhat difficult         04/23/2023   11:43 AM  GAD 7 : Generalized Anxiety Score  Nervous, Anxious, on Edge 1  Control/stop worrying 1  Worry too much - different things 2  Trouble relaxing 2  Restless 0  Easily annoyed or irritable 2  Afraid - awful might happen 1  Total GAD 7 Score 9  Anxiety Difficulty Somewhat difficult      ROS    Objective:     There were no vitals taken for this visit. BP Readings from Last 3 Encounters:  04/23/23 (!) 116/42  03/04/23 116/62  02/24/23 121/65   Wt Readings from Last 3 Encounters:  04/23/23  259 lb (117.5 kg)  03/04/23 258 lb (117 kg)  02/24/23 257 lb 8 oz (116.8 kg)      Physical Exam   No results found for any visits on 04/30/23.     The ASCVD Risk score (Arnett DK, et al., 2019) failed to calculate for the following reasons:   Risk score cannot be calculated because patient has a medical history suggesting prior/existing ASCVD    Assessment & Plan:  Hyperlipidemia associated with type 2 diabetes mellitus ([name])  Cerebral thrombosis with cerebral infarction ([name])     No follow-ups on file.    Michael Charon, NP

## 2023-04-30 ENCOUNTER — Encounter: Payer: Self-pay | Admitting: General Practice

## 2023-04-30 ENCOUNTER — Ambulatory Visit: Admitting: General Practice

## 2023-04-30 VITALS — BP 118/58 | HR 60 | Temp 98.7°F | Wt 261.0 lb

## 2023-04-30 DIAGNOSIS — N182 Chronic kidney disease, stage 2 (mild): Secondary | ICD-10-CM

## 2023-04-30 DIAGNOSIS — E1169 Type 2 diabetes mellitus with other specified complication: Secondary | ICD-10-CM | POA: Diagnosis not present

## 2023-04-30 DIAGNOSIS — E1122 Type 2 diabetes mellitus with diabetic chronic kidney disease: Secondary | ICD-10-CM

## 2023-04-30 DIAGNOSIS — I6330 Cerebral infarction due to thrombosis of unspecified cerebral artery: Secondary | ICD-10-CM

## 2023-04-30 DIAGNOSIS — Z89511 Acquired absence of right leg below knee: Secondary | ICD-10-CM

## 2023-04-30 DIAGNOSIS — Z7984 Long term (current) use of oral hypoglycemic drugs: Secondary | ICD-10-CM

## 2023-04-30 DIAGNOSIS — F17200 Nicotine dependence, unspecified, uncomplicated: Secondary | ICD-10-CM

## 2023-04-30 DIAGNOSIS — Z01818 Encounter for other preprocedural examination: Secondary | ICD-10-CM | POA: Diagnosis not present

## 2023-04-30 NOTE — Assessment & Plan Note (Signed)
 Smoking cessation instruction/counseling given:  counseled patient on the dangers of tobacco use, advised patient to stop smoking, and reviewed strategies to maximize success

## 2023-04-30 NOTE — Telephone Encounter (Signed)
 Received clearance via epic okay to hold Plavix 5 days prior to the procedure. Called patient no answer left message on voice mail to call office back.

## 2023-04-30 NOTE — Assessment & Plan Note (Addendum)
 No concerns.  Wears a prosthetic.   Followed by ortho once a year for rx for sleeve and prosthetic leg.

## 2023-04-30 NOTE — Telephone Encounter (Signed)
 Patient's wife returned call, stated she received message and understood to hold plavix.

## 2023-04-30 NOTE — Patient Instructions (Addendum)
 Start checking your blood sugar levels.  Appropriate times to check your blood sugar levels are:  -Before any meal (breakfast, lunch, dinner) -Two hours after any meal (breakfast, lunch, dinner) -Bedtime  Record your readings and notify me if you continue to consistently run at or above 150.   Send me readings via mychart in two weeks.   Continue metformin 2 in the morning and 2 at night.   Consider pneumonia vaccine.  Schedule diabetes eye exam.  Colonoscopy clearance- stop Plavix and the research trial medication five days prior to your colonoscopy.

## 2023-04-30 NOTE — Assessment & Plan Note (Signed)
 Last A1c in November 6.8  Has not been taking Metformin as prescribed.  Discussed medication adherence.   He will starting checking blood sugar every morning prior to breakfast. Paper log provided. His wife will send the readings via mychart in two weeks.   Continue Metformin as prescribed, 1000 mg twice a day.  Foot exam UTD.  Urine ACR UTD.   Advised patient and wife to schedule diabetic eye exam.  Declines pneumonia vaccine.  Follow up in May.

## 2023-04-30 NOTE — Assessment & Plan Note (Signed)
 Colonoscopy clearance provided to stop plavix five days prior to the procedure.   Also asked patient to stop the research trial medication as he is not aware if it is a placebo or blood thinner.  He did the same for the dental procedure.

## 2023-04-30 NOTE — Telephone Encounter (Signed)
 Hi Tisha,   Patient can stop Plavix for five days prior to the procedure.   -[place] Charon, DNP, AGNP-C 04/30/2023 11:58 AM

## 2023-05-05 ENCOUNTER — Other Ambulatory Visit (HOSPITAL_COMMUNITY): Payer: Self-pay | Admitting: Pharmacist

## 2023-05-05 MED ORDER — STUDY - OCEANIC-STROKE - ASUNDEXIAN 50 MG OR PLACEBO TABLET (PI-SETHI): 1.0000 | ORAL_TABLET | Freq: Every day | ORAL | 0 refills | Status: DC

## 2023-05-12 ENCOUNTER — Other Ambulatory Visit: Payer: Self-pay | Admitting: General Practice

## 2023-05-12 DIAGNOSIS — I10 Essential (primary) hypertension: Secondary | ICD-10-CM

## 2023-05-16 ENCOUNTER — Encounter: Payer: Self-pay | Admitting: Gastroenterology

## 2023-05-23 ENCOUNTER — Ambulatory Visit (AMBULATORY_SURGERY_CENTER): Admitting: Gastroenterology

## 2023-05-23 ENCOUNTER — Encounter: Payer: Self-pay | Admitting: Gastroenterology

## 2023-05-23 VITALS — BP 147/79 | HR 58 | Temp 98.2°F | Resp 16 | Ht 73.5 in | Wt 261.0 lb

## 2023-05-23 DIAGNOSIS — D122 Benign neoplasm of ascending colon: Secondary | ICD-10-CM | POA: Diagnosis not present

## 2023-05-23 DIAGNOSIS — D120 Benign neoplasm of cecum: Secondary | ICD-10-CM | POA: Diagnosis not present

## 2023-05-23 DIAGNOSIS — Z1211 Encounter for screening for malignant neoplasm of colon: Secondary | ICD-10-CM

## 2023-05-23 DIAGNOSIS — K648 Other hemorrhoids: Secondary | ICD-10-CM | POA: Diagnosis not present

## 2023-05-23 DIAGNOSIS — R195 Other fecal abnormalities: Secondary | ICD-10-CM

## 2023-05-23 DIAGNOSIS — Q438 Other specified congenital malformations of intestine: Secondary | ICD-10-CM

## 2023-05-23 DIAGNOSIS — K5520 Angiodysplasia of colon without hemorrhage: Secondary | ICD-10-CM

## 2023-05-23 DIAGNOSIS — K635 Polyp of colon: Secondary | ICD-10-CM | POA: Diagnosis not present

## 2023-05-23 DIAGNOSIS — D123 Benign neoplasm of transverse colon: Secondary | ICD-10-CM | POA: Diagnosis not present

## 2023-05-23 MED ORDER — SODIUM CHLORIDE 0.9 % IV SOLN: 500.0000 mL | Freq: Once | INTRAVENOUS | Status: DC

## 2023-05-23 NOTE — Progress Notes (Signed)
VS by DT    

## 2023-05-23 NOTE — Op Note (Addendum)
 [facility] Patient Name: [name] Procedure Date: 05/23/2023 9:16 AM MRN: [id number] Endoscopist: Ace Abu L. Dominic Friendly , MD, [id number] Age: 63 Referring MD:  Date of Birth: 12-May-1960 Gender: Male Account #: [id number] Procedure:                Colonoscopy Indications:              Positive Cologuard test Medicines:                Monitored Anesthesia Care Procedure:                Pre-Anesthesia Assessment:                           - Prior to the procedure, a History and Physical                            was performed, and patient medications and                            allergies were reviewed. The patient's tolerance of                            previous anesthesia was also reviewed. The risks                            and benefits of the procedure and the sedation                            options and risks were discussed with the patient.                            All questions were answered, and informed consent                            was obtained. Prior Anticoagulants: The patient has                            taken Plavix  (clopidogrel ), last dose was 5 days                            prior to procedure. This patient is also on a                            neurology study medicine that is either a DOAC                            (asundexian) or placebo. This was also held 5 days                            before today's procedure. ASA Grade Assessment: III                            - A patient with severe systemic disease. After  reviewing the risks and benefits, the patient was                            deemed in satisfactory condition to undergo the                            procedure.                           After obtaining informed consent, the colonoscope                            was passed under direct vision. Throughout the                            procedure, the patient's blood pressure, pulse, and                             oxygen saturations were monitored continuously. The                            CF [id number] #[id number] was introduced through the anus                            and advanced to the the cecum, identified by                            appendiceal orifice and ileocecal valve. The                            colonoscopy was extremely difficult due to a                            redundant colon, significant looping, a tortuous                            colon, challenging polyp location, respiratory                            motion and the patient's body habitus. Successful                            completion of the procedure was aided by using                            manual pressure, straightening and shortening the                            scope to obtain bowel loop reduction and lavage.                            The patient tolerated the procedure fairly well.  The quality of the bowel preparation was good after                            additional lavage of dark liquid. The ileocecal                            valve, appendiceal orifice, and rectum were                            photographed. The bowel preparation used was                            SUFLAVE . Scope In: 9:23:28 AM Scope Out: 10:19:11 AM Scope Withdrawal Time: 0 hours 46 minutes 44 seconds  Total Procedure Duration: 0 hours 55 minutes 43 seconds  Findings:                 The perianal and digital rectal examinations were                            normal.                           A single small angioectasia was found in the                            proximal ascending colon.                           Repeat examination of right colon under NBI                            performed.                           A diminutive polyp was found in the cecum. The                            polyp was semi-sessile and adjacent to the AO. The                            polyp was removed  with a cold snare. Resection and                            retrieval were complete. (Jar 1)                           A 20 mm x 5-65mm polyp was found in the proximal                            ascending colon. The polyp was sessile and in a                            challenging position between folds. Scope  positioning also challenging due to the above-noted                            factors. The polyp was removed with a piecemeal                            technique using a hot and cold snare. Resection and                            retrieval were complete. (Jar 2)                           Four sessile polyps were found in the transverse                            colon (3) and ascending colon (1). The polyps were                            4 to 6 mm in size. These polyps were removed with a                            cold snare. Resection and retrieval were complete.                            (Jar 3)                           An area of polypoid mucosa with some surface                            changes appearing probably hyperplastic or from                            prolapse was found in the recto-sigmoid colon. (See                            photo) biopsies were taken with a cold forceps for                            histology. (Jar 4)                           The left colon was significantly tortuous and                            redundant.                           Internal hemorrhoids were found.                           The exam was otherwise without abnormality on  direct and retroflexion views. Complications:            No immediate complications. Estimated Blood Loss:     Estimated blood loss was minimal. Impression:               - A single colonic angioectasia.                           - One diminutive polyp in the cecum, removed with a                            cold snare. Resected and retrieved.                            - One 20 mm polyp in the proximal ascending colon,                            removed piecemeal using a hot snare. Resected and                            retrieved.                           - Four 4 to 6 mm polyps in the transverse colon and                            in the ascending colon, removed with a cold snare.                            Resected and retrieved.                           - Polypoid mucosa in the recto-sigmoid colon.                            Biopsied.                           - Tortuous colon.                           - Internal hemorrhoids.                           - The examination was otherwise normal on direct                            and retroflexion views. Recommendation:           - Patient has a contact number available for                            emergencies. The signs and symptoms of potential                            delayed complications were discussed with the  patient. Return to normal activities tomorrow.                            Written discharge instructions were provided to the                            patient.                           - Resume previous diet.                           - Resume Plavix  (clopidogrel ) at prior dose in 2                            days.                           -Resume your neurology study medication 2 days from                            now.                           - Await pathology results.                           - Repeat colonoscopy is recommended for                            surveillance. The colonoscopy date will be                            determined after pathology results from today's                            exam become available for review. (Likely 6 to 12                            months)                           Consume more fluid with bowel preparation for next                            exam. Ace Abu L. Dominic Friendly, MD 05/23/2023 10:33:27  AM This report has been signed electronically.

## 2023-05-23 NOTE — Progress Notes (Signed)
 Called to room to assist during endoscopic procedure.  Patient ID and intended procedure confirmed with present staff. Received instructions for my participation in the procedure from the performing physician.

## 2023-05-23 NOTE — Patient Instructions (Addendum)
   RESUME PLAVIX  AT PRIOR DOSE IN 2 DAYS  Resume your neurology study medication 2 days from today   Handouts on polyps & hemorrhoids given to you today.   Await pathology results on polyps removed and biopsy results  Continue previous diet & medications  For next Colonoscopy - consume more fluid with bowel prep      YOU HAD AN ENDOSCOPIC PROCEDURE TODAY AT THE [facility]:   Refer to the procedure report that was given to you for any specific questions about what was found during the examination.  If the procedure report does not answer your questions, please call your gastroenterologist to clarify.  If you requested that your care partner not be given the details of your procedure findings, then the procedure report has been included in a sealed envelope for you to review at your convenience later.  YOU SHOULD EXPECT: Some feelings of bloating in the abdomen. Passage of more gas than usual.  Walking can help get rid of the air that was put into your GI tract during the procedure and reduce the bloating. If you had a lower endoscopy (such as a colonoscopy or flexible sigmoidoscopy) you may notice spotting of blood in your stool or on the toilet paper. If you underwent a bowel prep for your procedure, you may not have a normal bowel movement for a few days.  Please Note:  You might notice some irritation and congestion in your nose or some drainage.  This is from the oxygen used during your procedure.  There is no need for concern and it should clear up in a day or so.  SYMPTOMS TO REPORT IMMEDIATELY:  Following lower endoscopy (colonoscopy or flexible sigmoidoscopy):  Excessive amounts of blood in the stool  Significant tenderness or worsening of abdominal pains  Swelling of the abdomen that is new, acute  Fever of 100F or higher   For urgent or emergent issues, a gastroenterologist can be reached at any hour by calling [phone]. Do not use MyChart messaging for  urgent concerns.    DIET:  We do recommend a small meal at first, but then you may proceed to your regular diet.  Drink plenty of fluids but you should avoid alcoholic beverages for 24 hours.  ACTIVITY:  You should plan to take it easy for the rest of today and you should NOT DRIVE or use heavy machinery until tomorrow (because of the sedation medicines used during the test).    FOLLOW UP: Our staff will call the number listed on your records the next business day following your procedure.  We will call around 7:15- 8:00 am to check on you and address any questions or concerns that you may have regarding the information given to you following your procedure. If we do not reach you, we will leave a message.     If any biopsies were taken you will be contacted by phone or by letter within the next 1-3 weeks.  Please call us  at [phone] if you have not heard about the biopsies in 3 weeks.    SIGNATURES/CONFIDENTIALITY: You and/or your care partner have signed paperwork which will be entered into your electronic medical record.  These signatures attest to the fact that that the information above on your After Visit Summary has been reviewed and is understood.  Full responsibility of the confidentiality of this discharge information lies with you and/or your care-partner.

## 2023-05-23 NOTE — Progress Notes (Signed)
 Report to PACU, RN, vss, BBS= Clear.

## 2023-05-23 NOTE — Progress Notes (Signed)
 History and Physical:  This patient presents for endoscopic testing for: Encounter Diagnosis  Name Primary?   Positive colorectal cancer screening using Cologuard test Yes    63 year old man seen in our office 03/04/2023 for evaluation of positive Cologuard test and discussion of colonoscopy. As indicated in that note, he has multiple medical issues including prior CVA for which he is maintained on Plavix , and he is also in a neurology study for direct anticoagulant asundexian, and he does not know if he is on the medicine or a placebo. He had subsequent evaluations by neurology and primary care, and the decision was to hold both his Plavix  and starting medicine for 5 days before today's procedure.  He reports that that was done, and he also denies any abdominal pain, altered bowel habits or rectal bleeding.  He has no family history of colorectal cancer and has not previously had colon cancer screening prior to this recent Cologuard test.  Patient is otherwise without complaints or active issues today.   Past Medical History: Past Medical History:  Diagnosis Date   Diabetic neuropathy ([name])    Diverticulitis    GERD (gastroesophageal reflux disease)    History of hepatitis C    has been treated in the past   History of kidney stones    Hyperlipidemia    Hypertension    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[name])    Other testicular hypofunction    Severe sepsis ([name]) 08/26/2019   Stroke ([name])    Type II or unspecified type diabetes mellitus without mention of complication, not stated as uncontrolled    Vitamin D  deficiency      Past Surgical History: Past Surgical History:  Procedure Laterality Date   AMPUTATION Right 09/01/2019   Procedure: RIGHT BELOW KNEE AMPUTATION;  Surgeon: Timothy Ford, MD;  Location: [facility] OR;  Service: Orthopedics;  Laterality: Right;   CHOLECYSTECTOMY  1987   COLONOSCOPY     I & D EXTREMITY Right 08/20/2019   Procedure: IRRIGATION & DEBRIDEMENT RIGHT FOOT  PARTIAL CALCANEAL EXCISION;  Surgeon: Timothy Ford, MD;  Location: [facility] OR;  Service: Orthopedics;  Laterality: Right;   I & D EXTREMITY Right 08/27/2019   Procedure: IRRIGATION AND DEBRIDEMENT  OF FOOT;  Surgeon: Wes Hamman, MD;  Location: [facility] OR;  Service: Orthopedics;  Laterality: Right;   ORIF TIBIA FRACTURE     x 3 right leg    Allergies: Allergies  Allergen Reactions   Invokana [Canagliflozin] Other (See Comments)    Extremity edema/caused pain   Codeine Camsylate [Codeine] Rash   Morphine And Codeine Rash    Outpatient Meds: Current Outpatient Medications  Medication Sig Dispense Refill   cetirizine (ZYRTEC) 10 MG tablet Take 10 mg by mouth daily as needed for allergies.     Cholecalciferol (VITAMIN D ) 50 MCG (2000 UT) CAPS Take 2,000 Units by mouth 2 (two) times daily.     cyanocobalamin  (VITAMIN B12) 500 MCG tablet Take 5,000 mcg by mouth daily. Pt is taking one tablet twice weekly     hydrochlorothiazide  (HYDRODIURIL ) 25 MG tablet TAKE ONE TABLET BY MOUTH DAILY FOR BLOOD PRESSURE,FLUID RETENTION,ANKLE SWELLING (Patient taking differently: Pt is taking 1/2 TABLET BY MOUTH DAILY FOR BLOOD PRESSURE,FLUID RETENTION,ANKLE SWELLING) 30 tablet 11   lisinopril  (ZESTRIL ) 10 MG tablet TAKE 1 TABLET BY MOUTH EVERY DAY 30 tablet 2   metFORMIN  (GLUCOPHAGE -XR) 500 MG 24 hr tablet Take  2 tablets  2 x / day  with Meals  for Diabetes                                             /  TAKE                                         BY                                                 MOUTH 360 tablet 3   pantoprazole  (PROTONIX ) 40 MG tablet Take  1 tablet   Daily   to Prevent Heartburn & Indigestion 90 tablet 3   rosuvastatin  (CRESTOR ) 40 MG tablet TAKE ONE TABLET BY MOUTH DAILY FOR CHOLESTEROL 30 tablet 11   verapamil  (CALAN -SR) 240 MG CR tablet Take 1 tablet  Daily with a Meal for BP & Heart Rhythm                                                              /                                                                   TAKE                                         BY                                                 MOUTH 90 tablet 3   Zinc 50 MG TABS Take by mouth.     citalopram  (CELEXA ) 40 MG tablet TAKE 1 TABLET BY MOUTH ONCE DAILY FOR MOOD AND CHRONIC ANXIETY (Patient not taking: Reported on 05/23/2023) 90 tablet 2   clopidogrel  (PLAVIX ) 75 MG tablet Take  1 tablet  Daily  to Prevent Blood Clots & Strokes 30 tablet 2   mupirocin  ointment (BACTROBAN ) 2 % Apply 1 Application topically daily as needed.     Study - OCEANIC-STROKE - asundexian 50 mg or placebo tablet (PI-Sethi) Take 1 tablet (50 mg total) by mouth daily. For investigational use only. Take 1 tablet by mouth once daily at the same time each day, preferably in the morning. Please bring bottle to every visit. 196 tablet 0   Current Facility-Administered Medications  Medication Dose Route Frequency Provider Last Rate Last Admin   0.9 %  sodium chloride  infusion  500 mL Intravenous Once Danis, Kartier Bennison L III, MD          ___________________________________________________________________ Objective   Exam:  BP 130/68   Pulse (!) 58   Temp 98.2 F (36.8 C)   Resp 18   Ht 6' 1.5" (1.867 m)   Wt 261 lb (118.4 kg)   SpO2 94%   BMI 33.97 kg/m  CV: regular , S1/S2 Resp: clear to auscultation bilaterally, normal RR and effort noted GI: soft, no tenderness, with active bowel sounds.   Assessment: Encounter Diagnosis  Name Primary?   Positive colorectal cancer screening using Cologuard test Yes     Plan: Colonoscopy   The benefits and risks of the planned procedure(s) were described in detail with the patient or (when appropriate) their health care proxy.  Risks were outlined as including, but not limited to, bleeding, infection, perforation, adverse medication reaction leading to cardiac or pulmonary decompensation, pancreatitis (if ERCP).  The limitation  of incomplete mucosal visualization was also discussed.  No guarantees or warranties were given.  The patient is appropriate for an endoscopic procedure in the ambulatory setting.   - Lorella Roles, MD

## 2023-05-26 ENCOUNTER — Telehealth: Payer: Self-pay

## 2023-05-26 NOTE — Telephone Encounter (Signed)
  Follow up Call-     05/23/2023    8:50 AM  Call back number  Post procedure Call Back phone  # [phone] wife  Permission to leave phone message Yes     Patient questions:  Do you have a fever, pain , or abdominal swelling? No. Pain Score  0 *  Have you tolerated food without any problems? Yes.    Have you been able to return to your normal activities? Yes.    Do you have any questions about your discharge instructions: Diet   No. Medications  No. Follow up visit  No.  Do you have questions or concerns about your Care? No.  Actions: * If pain score is 4 or above: No action needed, pain <4.

## 2023-05-27 LAB — SURGICAL PATHOLOGY

## 2023-06-03 ENCOUNTER — Ambulatory Visit: Payer: Self-pay | Admitting: Gastroenterology

## 2023-06-09 ENCOUNTER — Other Ambulatory Visit: Payer: Self-pay | Admitting: General Practice

## 2023-06-09 DIAGNOSIS — F419 Anxiety disorder, unspecified: Secondary | ICD-10-CM

## 2023-06-11 ENCOUNTER — Other Ambulatory Visit (HOSPITAL_COMMUNITY): Payer: Self-pay

## 2023-06-11 ENCOUNTER — Telehealth: Payer: Self-pay

## 2023-06-11 ENCOUNTER — Ambulatory Visit (INDEPENDENT_AMBULATORY_CARE_PROVIDER_SITE_OTHER): Admitting: General Practice

## 2023-06-11 ENCOUNTER — Encounter: Payer: Self-pay | Admitting: General Practice

## 2023-06-11 VITALS — BP 112/60 | HR 75 | Temp 98.1°F | Ht 73.5 in | Wt 259.0 lb

## 2023-06-11 DIAGNOSIS — E1122 Type 2 diabetes mellitus with diabetic chronic kidney disease: Secondary | ICD-10-CM

## 2023-06-11 DIAGNOSIS — Z7984 Long term (current) use of oral hypoglycemic drugs: Secondary | ICD-10-CM

## 2023-06-11 DIAGNOSIS — N182 Chronic kidney disease, stage 2 (mild): Secondary | ICD-10-CM | POA: Diagnosis not present

## 2023-06-11 DIAGNOSIS — I10 Essential (primary) hypertension: Secondary | ICD-10-CM | POA: Diagnosis not present

## 2023-06-11 DIAGNOSIS — F419 Anxiety disorder, unspecified: Secondary | ICD-10-CM | POA: Diagnosis not present

## 2023-06-11 DIAGNOSIS — F17200 Nicotine dependence, unspecified, uncomplicated: Secondary | ICD-10-CM | POA: Diagnosis not present

## 2023-06-11 LAB — POCT GLYCOSYLATED HEMOGLOBIN (HGB A1C): Hemoglobin A1C: 6.9 % — AB (ref 4.0–5.6)

## 2023-06-11 MED ORDER — TIRZEPATIDE 2.5 MG/0.5ML ~~LOC~~ SOAJ: 2.5000 mg | SUBCUTANEOUS | 0 refills | Status: DC

## 2023-06-11 MED ORDER — METFORMIN HCL ER 500 MG PO TB24: ORAL_TABLET | ORAL | Status: DC

## 2023-06-11 NOTE — Patient Instructions (Addendum)
 Schedule eye exam when you can.   Continue Metformin  1 in the AM and 2 in the PM.  Start tirzepitide (Mounjaro) for diabetes. Start by injecting 2.5 mg into the skin once weekly for 4 weeks.   Follow up in 3-4 weeks for Mounjaro.   It was a pleasure to see you today!

## 2023-06-11 NOTE — Assessment & Plan Note (Addendum)
 BP at goal.  Continue lisinopril  10 mg once daily and hydrochlorothiazide  12.5 mg once daily. He does increase hydrochlorothiazide  to 25 mg if he develops any edema.    Continue Verapamil  240 mg once daily for irregular heartbeat.  Does not see cardiology.  No concerns.

## 2023-06-11 NOTE — Assessment & Plan Note (Signed)
 Has cut back but still is smoking.   Discussed at length but still declines lung cancer screening.

## 2023-06-11 NOTE — Telephone Encounter (Signed)
 Pharmacy Patient Advocate Encounter   Received notification from CoverMyMeds that prior authorization for Mounjaro 2.5MG /0.5ML auto-injectors is required/requested.   Insurance verification completed.   The patient is insured through [employer] .   Per test claim: PA required; PA submitted to above mentioned insurance via CoverMyMeds Key/confirmation #/EOC [id number] Status is pending

## 2023-06-11 NOTE — Assessment & Plan Note (Addendum)
 Hemoglobin A1c- 6.9 today. Slightly increased.  Foot exam up to date.  Urine acr up to date.  Declines pneumonia vaccine- educated patient at length. He will consider it.  Already on a crestor .  He will schedule eye exam. Discussed medication adherence.  Continue Metformin  XR 500 mg in AM and 1000 mg in PM. Given that he has been having diarrhea with metformin ; will consider GLP1.  He has tried Trulicity  before and was successful but stopped it a year ago. Him or his significant other do not remember why they stopped it.  Patient denies any personal history of pancreatitis or family history of thyroid  cancer.  Agreeable to start Mounjaro 2.5 mg once weekly. Rx sent. Discussed hypoglycemic side effects. Discussed medication side effects, hand out provided.   Discussed the importance of diabetic diet.  F/u in 4 weeks.

## 2023-06-11 NOTE — Progress Notes (Addendum)
 Established Patient Office Visit  Subjective   Patient ID: [name], male    DOB: [birth year]  Age: 63 y.o. MRN: [id number]  Chief Complaint  Patient presents with   Diabetes    Patient brought in list of sugar readings which look pretty good; has a couple of higher readings but overall good.     Diabetes Pertinent negatives for hypoglycemia include no dizziness, headaches or nervousness/anxiousness. Pertinent negatives for diabetes include no chest pain and no polydipsia.    [name] is a 63 year old male with past medical history of HTN, cerebral infarction, COPD, Hep C, HLD, type 2 DM, CKD stage 2, obesity and smoking presents today for a diabetes follow up.   DM2- Current medications include: Metformin  XR 500 mg in the AM and 1000 mg in the evening.  He is checking his blood glucose once daily and is getting readings of 120s-180s.  Last A1C: 6.8 in November, 2024. Last Eye Exam: due; they will schedule. Last Foot Exam: up to date Pneumonia Vaccination: declines Urine Microalbumin: up to date Statin: rosuvastatin  40 mg  Dietary changes since last visit: he stays up late at night and that when he eats snacks. Usually his meals are at home but he does eat a lot of carbs. He usually does drink one diet soda a day.   Exercise: no regular exercise.    Patient Active Problem List   Diagnosis Date Noted   Preoperative clearance 04/30/2023   Positive colorectal cancer screening using Cologuard test 04/23/2023   Anxiety 04/23/2023   Cerebral thrombosis with cerebral infarction 05/09/2021   Aphasia 05/08/2021   Leukocytosis 05/08/2021   Right below-knee amputee ([name]) 09/03/2019   Hyponatremia 08/26/2019   Cutaneous abscess of right foot    FHx: heart disease 10/08/2017   Type 2 diabetes mellitus with stage 2 chronic kidney disease, without long-term current use of insulin  ([name]) 10/08/2017   Insomnia 07/08/2017   Smoker 08/17/2015   COPD (chronic obstructive  pulmonary disease) ([name]) 08/17/2015   CKD stage 2 due to type 2 diabetes mellitus ([name]) 08/31/2013   Medication management 05/19/2013   Morbid obesity (BMI 35) 05/19/2013   Essential hypertension    Hyperlipidemia associated with type 2 diabetes mellitus ([name])    Vitamin D  deficiency    History of kidney stones    Testosterone  deficiency    History of hepatitis C    Past Medical History:  Diagnosis Date   Diabetic neuropathy ([name])    Diverticulitis    GERD (gastroesophageal reflux disease)    History of hepatitis C    has been treated in the past   History of kidney stones    Hyperlipidemia    Hypertension    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[name])    Other testicular hypofunction    Severe sepsis ([name]) 08/26/2019   Stroke ([name])    Type II or unspecified type diabetes mellitus without mention of complication, not stated as uncontrolled    Vitamin D  deficiency    Past Surgical History:  Procedure Laterality Date   AMPUTATION Right 09/01/2019   Procedure: RIGHT BELOW KNEE AMPUTATION;  Surgeon: Timothy Ford, MD;  Location: [facility] OR;  Service: Orthopedics;  Laterality: Right;   CHOLECYSTECTOMY  1987   COLONOSCOPY     I & D EXTREMITY Right 08/20/2019   Procedure: IRRIGATION & DEBRIDEMENT RIGHT FOOT PARTIAL CALCANEAL EXCISION;  Surgeon: Timothy Ford, MD;  Location: [facility] OR;  Service: Orthopedics;  Laterality: Right;  I & D EXTREMITY Right 08/27/2019   Procedure: IRRIGATION AND DEBRIDEMENT  OF FOOT;  Surgeon: Wes Hamman, MD;  Location: [facility] OR;  Service: Orthopedics;  Laterality: Right;   ORIF TIBIA FRACTURE     x 3 right leg   Allergies  Allergen Reactions   Invokana [Canagliflozin] Other (See Comments)    Extremity edema/caused pain   Codeine Camsylate [Codeine] Rash   Morphine And Codeine Rash         06/11/2023    8:37 AM 04/30/2023   11:03 AM 04/23/2023   11:42 AM  Depression screen PHQ 2/9  Decreased Interest 2 0 0  Down, Depressed, Hopeless 2 1 0  PHQ - 2 Score 4 1 0   Altered sleeping 2 2 1   Tired, decreased energy 3 1 1   Change in appetite 1 0 0  Feeling bad or failure about yourself  0 2 1  Trouble concentrating 2 2 0  Moving slowly or fidgety/restless 0 2 0  Suicidal thoughts 0 0 0  PHQ-9 Score 12 10 3   Difficult doing work/chores Somewhat difficult Somewhat difficult Somewhat difficult       06/11/2023    8:39 AM 04/30/2023   11:03 AM 04/23/2023   11:43 AM  GAD 7 : Generalized Anxiety Score  Nervous, Anxious, on Edge 0 0 1  Control/stop worrying 1 1 1   Worry too much - different things 1 2 2   Trouble relaxing 1 1 2   Restless 0 1 0  Easily annoyed or irritable 2 2 2   Afraid - awful might happen 0 2 1  Total GAD 7 Score 5 9 9   Anxiety Difficulty Somewhat difficult Somewhat difficult Somewhat difficult      Review of Systems  Constitutional:  Negative for chills and fever.  Respiratory:  Negative for shortness of breath.   Cardiovascular:  Negative for chest pain.  Gastrointestinal:  Negative for abdominal pain, constipation, diarrhea, heartburn, nausea and vomiting.  Genitourinary:  Negative for dysuria, frequency and urgency.  Neurological:  Negative for dizziness and headaches.  Endo/Heme/Allergies:  Negative for polydipsia.  Psychiatric/Behavioral:  Negative for depression and suicidal ideas. The patient is not nervous/anxious.       Objective:     BP 112/60 (BP Location: Left Arm, Patient Position: Sitting, Cuff Size: Normal)   Pulse 75   Temp 98.1 F (36.7 C) (Oral)   Ht 6' 1.5" (1.867 m)   Wt 259 lb (117.5 kg)   SpO2 96%   BMI 33.71 kg/m  BP Readings from Last 3 Encounters:  06/11/23 112/60  05/23/23 (!) 147/79  04/30/23 (!) 118/58   Wt Readings from Last 3 Encounters:  06/11/23 259 lb (117.5 kg)  05/23/23 261 lb (118.4 kg)  04/30/23 261 lb (118.4 kg)      Physical Exam Vitals and nursing note reviewed.  Constitutional:      Appearance: Normal appearance.  Cardiovascular:     Rate and Rhythm: Normal rate  and regular rhythm.     Pulses: Normal pulses.     Heart sounds: Normal heart sounds.  Pulmonary:     Effort: Pulmonary effort is normal.     Breath sounds: Normal breath sounds.  Neurological:     Mental Status: He is alert and oriented to person, place, and time.  Psychiatric:        Mood and Affect: Mood normal.        Behavior: Behavior normal.        Thought Content: Thought  content normal.        Judgment: Judgment normal.      Results for orders placed or performed in visit on 06/11/23  POCT HgB A1C  Result Value Ref Range   Hemoglobin A1C 6.9 (A) 4.0 - 5.6 %   HbA1c POC (<> result, manual entry)     HbA1c, POC (prediabetic range)     HbA1c, POC (controlled diabetic range)         The ASCVD Risk score (Arnett DK, et al., 2019) failed to calculate for the following reasons:   Risk score cannot be calculated because patient has a medical history suggesting prior/existing ASCVD    Assessment & Plan:  Type 2 diabetes mellitus with stage 2 chronic kidney disease, without long-term current use of insulin  ([name]) Assessment & Plan: Hemoglobin A1c- 6.9 today. Slightly increased.  Foot exam up to date.  Urine acr up to date.  Declines pneumonia vaccine- educated patient at length. He will consider it.  Already on a crestor .  He will schedule eye exam. Discussed medication adherence.  Continue Metformin  XR 500 mg in AM and 1000 mg in PM. Given that he has been having diarrhea with metformin ; will consider GLP1.  He has tried Trulicity  before and was successful but stopped it a year ago. Him or his significant other do not remember why they stopped it.  Patient denies any personal history of pancreatitis or family history of thyroid  cancer.  Agreeable to start Mounjaro 2.5 mg once weekly. Rx sent. Discussed hypoglycemic side effects. Discussed medication side effects, hand out provided.   Discussed the importance of diabetic diet.  F/u in 4 weeks.  Orders: -     POCT  glycosylated hemoglobin (Hb A1C) -     Tirzepatide; Inject 2.5 mg into the skin once a week.  Dispense: 2 mL; Refill: 0 -     metFORMIN  HCl ER; Take  1 tablet in the morning and 2 in the evening with Meals  for Diabetes                                             /                                                                   TAKE                                         BY                                                 MOUTH  Smoker Assessment & Plan: Has cut back but still is smoking.   Discussed at length but still declines lung cancer screening.    Essential hypertension Assessment & Plan: BP at goal.  Continue lisinopril  10 mg once daily and hydrochlorothiazide  12.5 mg once daily. He does increase hydrochlorothiazide  to 25 mg if he  develops any edema.    Continue Verapamil  240 mg once daily for irregular heartbeat.  Does not see cardiology.  No concerns.    Anxiety Assessment & Plan: Controlled.   Continue Celexa . Refills sent.    Return in about 4 weeks (around 07/09/2023) for mounjaro.    Jolanda Nation, NP

## 2023-06-11 NOTE — Assessment & Plan Note (Signed)
 Controlled.   Continue Celexa . Refills sent.

## 2023-06-18 ENCOUNTER — Ambulatory Visit: Payer: Self-pay | Admitting: Nurse Practitioner

## 2023-06-27 ENCOUNTER — Other Ambulatory Visit (HOSPITAL_COMMUNITY): Payer: Self-pay

## 2023-06-30 ENCOUNTER — Other Ambulatory Visit (HOSPITAL_COMMUNITY): Payer: Self-pay

## 2023-07-04 ENCOUNTER — Other Ambulatory Visit (HOSPITAL_COMMUNITY): Payer: Self-pay

## 2023-07-07 ENCOUNTER — Other Ambulatory Visit (HOSPITAL_COMMUNITY): Payer: Self-pay

## 2023-07-09 ENCOUNTER — Encounter: Payer: Self-pay | Admitting: General Practice

## 2023-07-09 ENCOUNTER — Ambulatory Visit: Admitting: General Practice

## 2023-07-09 ENCOUNTER — Other Ambulatory Visit (HOSPITAL_COMMUNITY): Payer: Self-pay

## 2023-07-09 ENCOUNTER — Telehealth: Payer: Self-pay

## 2023-07-09 VITALS — BP 122/76 | HR 82 | Temp 97.9°F | Ht 73.5 in | Wt 257.0 lb

## 2023-07-09 DIAGNOSIS — Z7984 Long term (current) use of oral hypoglycemic drugs: Secondary | ICD-10-CM | POA: Diagnosis not present

## 2023-07-09 DIAGNOSIS — E1122 Type 2 diabetes mellitus with diabetic chronic kidney disease: Secondary | ICD-10-CM | POA: Diagnosis not present

## 2023-07-09 DIAGNOSIS — F419 Anxiety disorder, unspecified: Secondary | ICD-10-CM

## 2023-07-09 DIAGNOSIS — N182 Chronic kidney disease, stage 2 (mild): Secondary | ICD-10-CM

## 2023-07-09 DIAGNOSIS — S99929A Unspecified injury of unspecified foot, initial encounter: Secondary | ICD-10-CM | POA: Diagnosis not present

## 2023-07-09 DIAGNOSIS — F17200 Nicotine dependence, unspecified, uncomplicated: Secondary | ICD-10-CM

## 2023-07-09 MED ORDER — TIRZEPATIDE 5 MG/0.5ML ~~LOC~~ SOAJ: 5.0000 mg | SUBCUTANEOUS | 1 refills | Status: DC

## 2023-07-09 MED ORDER — CEPHALEXIN 500 MG PO CAPS: 1000.0000 mg | ORAL_CAPSULE | Freq: Two times a day (BID) | ORAL | 0 refills | Status: AC

## 2023-07-09 MED ORDER — CITALOPRAM HYDROBROMIDE 40 MG PO TABS: ORAL_TABLET | ORAL | 2 refills | Status: AC

## 2023-07-09 NOTE — Patient Instructions (Addendum)
 It is important that you improve your diet. Please limit carbohydrates in the form of white bread, rice, pasta, sweets, fast food, fried food, sugary drinks, etc. Increase your consumption of fresh fruits and vegetables, whole grains, lean protein.  Ensure you are consuming 64 ounces of water daily.  Start tirzepitide (Mounjaro ) for diabetes/weight loss. Start by injecting 5 mg into the skin once weekly for 4 weeks.   Start Keflex  1000 mg twice daily for five days.   You will either be contacted via phone regarding your referral to podiatry , or you may receive a letter on your MyChart portal from our referral team with instructions for scheduling an appointment. Please let us  know if you have not been contacted by anyone within two weeks.  Follow up in two months.  It was a pleasure to see you today!

## 2023-07-09 NOTE — Telephone Encounter (Signed)
 Pharmacy Patient Advocate Encounter   Received notification from Physician's Office that prior authorization for Mounjaro  2.5 is required/requested.   Insurance verification completed.   The patient is insured through [employer] .   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 Patient last filled 06/11/23. Has an open PA already for this medication per Cover My Meds

## 2023-07-09 NOTE — Assessment & Plan Note (Signed)
 Smoking cessation instruction/counseling given:  counseled patient on the dangers of tobacco use, advised patient to stop smoking, and reviewed strategies to maximize success

## 2023-07-09 NOTE — Telephone Encounter (Signed)
 Patient needs PA on 5mg  not 2.5mg . dose was increased today.

## 2023-07-09 NOTE — Telephone Encounter (Signed)
 Pharmacy Patient Advocate Encounter  Received notification from [employer] that Prior Authorization for Mounjaro  2.5MG /0.5ML auto-injectors  has been APPROVED from 06/12/23 to 06/11/24   PA #/Case ID/Reference #:  [id number]

## 2023-07-09 NOTE — Assessment & Plan Note (Signed)
 Controlled.  Foot exam UTD.  Urine ACR UTD. Declines pneumonia vaccine.  Crestor - CAD protection.  Scheduled for eye exam.   Continue Metformin  XR 500 mg in AM and 1000 mg in PM.   Increase Mounjaro  2.5 mg to 5 mg once weekly. Rx sent.   Discussed the importance of monitoring diet and exercise.   F/u in 2 months.

## 2023-07-09 NOTE — Assessment & Plan Note (Signed)
 Given swelling around the fifth digit toe and his hx of DM; will treat empirically.   Start Keflex  1000 mg BID for five days.   Referral placed for podiatry.  Discussed the importance of foot care with diabetes.  Verbalizes understanding.

## 2023-07-09 NOTE — Progress Notes (Signed)
 Established Patient Office Visit  Subjective   Patient ID: [name], male    DOB: 19-Jan-1961  Age: 63 y.o. MRN: [id number]  Chief Complaint  Patient presents with   Diabetes    Patient here today to f/u on DM and mounjaro  rx. Patients sugars are doing better; readings under 150.     Diabetes Pertinent negatives for hypoglycemia include no dizziness, headaches or nervousness/anxiousness. Pertinent negatives for diabetes include no chest pain and no polydipsia.   DM: Current medications include: Metformin  XR 500 mg in AM and 1000 mg in PM and Mounjaro  2.5 mg once weekly.   He is checking his blood glucose a few times a week and usually get readings of 108-111.   Last A1C: 6.9 in May, 2025 Last Eye Exam: scheduled for October Last Foot Exam: UTD Pneumonia Vaccination: declines Urine Microalbumin: UTD Statin: Crestor   Dietary changes since last visit: appetite has suppressed; no more late night snacking. Drinking pure kick and usually drinking diet soda.   Toe injury: fifth digit on left foot. He had his toe nail that got hung on his sock. Then he tore off the nail and he bled for two days. His wife foot elevated, put gauze on it and wrapped it which helped stopped the bleeding. He denies any fever, chills, pain, nausea, vomiting or drainage from the nail bed.   Exercise: no regular exercise. Does have more energy now so plans to start exercising soon.  Anxiety and depression: currently managed on Citalopram  40 mg once daily. He is doing well on it. He would like a refill on his medication. He denies SI/HI.   Tobacco abuse: still smoking one pack per day. Declines lung cancer screening. He has been trying to cut back but its hard. He denies any shortness of breath or difficulty breathing.     Patient Active Problem List   Diagnosis Date Noted   Injury of nail bed of toe 07/09/2023   Preoperative clearance 04/30/2023   Positive colorectal cancer screening using Cologuard  test 04/23/2023   Anxiety 04/23/2023   Cerebral thrombosis with cerebral infarction 05/09/2021   Aphasia 05/08/2021   Leukocytosis 05/08/2021   Right below-knee amputee ([name]) 09/03/2019   Hyponatremia 08/26/2019   Cutaneous abscess of right foot    FHx: heart disease 10/08/2017   Type 2 diabetes mellitus with stage 2 chronic kidney disease, without long-term current use of insulin  ([name]) 10/08/2017   Insomnia 07/08/2017   Smoker 08/17/2015   COPD (chronic obstructive pulmonary disease) ([name]) 08/17/2015   CKD stage 2 due to type 2 diabetes mellitus ([name]) 08/31/2013   Medication management 05/19/2013   Morbid obesity (BMI 35) 05/19/2013   Essential hypertension    Hyperlipidemia associated with type 2 diabetes mellitus ([name])    Vitamin D  deficiency    History of kidney stones    Testosterone  deficiency    History of hepatitis C    Past Medical History:  Diagnosis Date   Diabetic neuropathy ([name])    Diverticulitis    GERD (gastroesophageal reflux disease)    History of hepatitis C    has been treated in the past   History of kidney stones    Hyperlipidemia    Hypertension    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[name])    Other testicular hypofunction    Severe sepsis ([name]) 08/26/2019   Stroke ([name])    Type II or unspecified type diabetes mellitus without mention of complication, not stated as uncontrolled  Vitamin D  deficiency    Past Surgical History:  Procedure Laterality Date   AMPUTATION Right 09/01/2019   Procedure: RIGHT BELOW KNEE AMPUTATION;  Surgeon: Timothy Ford, MD;  Location: [facility] OR;  Service: Orthopedics;  Laterality: Right;   CHOLECYSTECTOMY  1987   COLONOSCOPY     I & D EXTREMITY Right 08/20/2019   Procedure: IRRIGATION & DEBRIDEMENT RIGHT FOOT PARTIAL CALCANEAL EXCISION;  Surgeon: Timothy Ford, MD;  Location: [facility] OR;  Service: Orthopedics;  Laterality: Right;   I & D EXTREMITY Right 08/27/2019   Procedure: IRRIGATION AND DEBRIDEMENT  OF FOOT;  Surgeon: Wes Hamman, MD;  Location: [facility] OR;  Service: Orthopedics;  Laterality: Right;   ORIF TIBIA FRACTURE     x 3 right leg   Allergies  Allergen Reactions   Invokana [Canagliflozin] Other (See Comments)    Extremity edema/caused pain   Codeine Camsylate [Codeine] Rash   Morphine And Codeine Rash         07/09/2023    8:55 AM 06/11/2023    8:37 AM 04/30/2023   11:03 AM  Depression screen PHQ 2/9  Decreased Interest 1 2 0  Down, Depressed, Hopeless 1 2 1   PHQ - 2 Score 2 4 1   Altered sleeping 1 2 2   Tired, decreased energy 1 3 1   Change in appetite 1 1 0  Feeling bad or failure about yourself  0 0 2  Trouble concentrating 2 2 2   Moving slowly or fidgety/restless 2 0 2  Suicidal thoughts 0 0 0  PHQ-9 Score 9 12 10   Difficult doing work/chores Not difficult at all Somewhat difficult Somewhat difficult       07/09/2023    8:56 AM 06/11/2023    8:39 AM 04/30/2023   11:03 AM 04/23/2023   11:43 AM  GAD 7 : Generalized Anxiety Score  Nervous, Anxious, on Edge 1 0 0 1  Control/stop worrying 1 1 1 1   Worry too much - different things 1 1 2 2   Trouble relaxing 0 1 1 2   Restless 0 0 1 0  Easily annoyed or irritable 1 2 2 2   Afraid - awful might happen 1 0 2 1  Total GAD 7 Score 5 5 9 9   Anxiety Difficulty Somewhat difficult Somewhat difficult Somewhat difficult Somewhat difficult      Review of Systems  Constitutional:  Negative for chills and fever.  Respiratory:  Negative for shortness of breath.   Cardiovascular:  Negative for chest pain.  Gastrointestinal:  Negative for abdominal pain, constipation, diarrhea, heartburn, nausea and vomiting.  Genitourinary:  Negative for dysuria, frequency and urgency.  Neurological:  Negative for dizziness and headaches.  Endo/Heme/Allergies:  Negative for polydipsia.  Psychiatric/Behavioral:  Negative for depression and suicidal ideas. The patient is not nervous/anxious.       Objective:     BP 122/76   Pulse 82   Temp 97.9 F (36.6 C) (Temporal)    Ht 6' 1.5 (1.867 m)   Wt 257 lb (116.6 kg)   SpO2 97%   BMI 33.45 kg/m  BP Readings from Last 3 Encounters:  07/09/23 122/76  06/11/23 112/60  05/23/23 (!) 147/79   Wt Readings from Last 3 Encounters:  07/09/23 257 lb (116.6 kg)  06/11/23 259 lb (117.5 kg)  05/23/23 261 lb (118.4 kg)      Physical Exam Vitals and nursing note reviewed.  Constitutional:      Appearance: Normal appearance.   Cardiovascular:  Rate and Rhythm: Normal rate and regular rhythm.     Pulses: Normal pulses.     Heart sounds: Normal heart sounds.  Pulmonary:     Effort: Pulmonary effort is normal.     Breath sounds: Normal breath sounds.   Musculoskeletal:       Feet:  Feet:     Left foot:     Skin integrity: Skin breakdown and erythema present.     Comments: Left Fifth digit - nail bed removed. Dried old blood. No drainage.   Skin:    General: Skin is warm.   Neurological:     Mental Status: He is alert and oriented to person, place, and time.   Psychiatric:        Mood and Affect: Mood normal.        Behavior: Behavior normal.        Thought Content: Thought content normal.        Judgment: Judgment normal.      No results found for any visits on 07/09/23.     The ASCVD Risk score (Arnett DK, et al., 2019) failed to calculate for the following reasons:   Risk score cannot be calculated because patient has a medical history suggesting prior/existing ASCVD    Assessment & Plan:  Type 2 diabetes mellitus with stage 2 chronic kidney disease, without long-term current use of insulin  [facility]) Assessment & Plan: Controlled.  Foot exam UTD.  Urine ACR UTD. Declines pneumonia vaccine.  Crestor - CAD protection.  Scheduled for eye exam.   Continue Metformin  XR 500 mg in AM and 1000 mg in PM.   Increase Mounjaro  2.5 mg to 5 mg once weekly. Rx sent.   Discussed the importance of monitoring diet and exercise.   F/u in 2 months.   Orders: -     Tirzepatide ; Inject 5 mg into  the skin once a week.  Dispense: 2 mL; Refill: 1  Anxiety Assessment & Plan: Controlled.   Continue Celexa  40 mg once daily. Refills sent.  Orders: -     Citalopram  Hydrobromide; TAKE 1 TABLET BY MOUTH ONCE DAILY FOR  MOOD  AND  CHRONIC  ANXIETY  Dispense: 30 tablet; Refill: 2  Injury of nail bed of toe Assessment & Plan: Given swelling around the fifth digit toe and his hx of DM; will treat empirically.   Start Keflex  1000 mg BID for five days.   Referral placed for podiatry.  Discussed the importance of foot care with diabetes.  Verbalizes understanding.  Orders: -     Ambulatory referral to Podiatry -     Cephalexin ; Take 2 capsules (1,000 mg total) by mouth 2 (two) times daily for 5 days.  Dispense: 20 capsule; Refill: 0  Smoker Assessment & Plan: Smoking cessation instruction/counseling given:  counseled patient on the dangers of tobacco use, advised patient to stop smoking, and reviewed strategies to maximize success       Return in about 2 months (around 09/11/2023) for DM.    Jolanda Nation, NP

## 2023-07-09 NOTE — Assessment & Plan Note (Signed)
 Controlled.   Continue Celexa  40 mg once daily. Refills sent.

## 2023-07-09 NOTE — Telephone Encounter (Signed)
 Received notification from [facility] that patients mounjaro  needs PA on increased dose that was sent in this morning. Please start PA for patient.

## 2023-07-10 ENCOUNTER — Other Ambulatory Visit (HOSPITAL_COMMUNITY): Payer: Self-pay

## 2023-07-10 NOTE — Telephone Encounter (Signed)
 Pharmacy Patient Advocate Encounter   Received notification from Pt Calls Messages that prior authorization for Mounjaro  2.5 is required/requested.   Insurance verification completed.   The patient is insured through [employer] .   Per test claim: PA required; PA submitted to above mentioned insurance via CoverMyMeds Key/confirmation #/EOC [id number] Status is pending

## 2023-07-16 ENCOUNTER — Other Ambulatory Visit (HOSPITAL_COMMUNITY): Payer: Self-pay

## 2023-07-16 ENCOUNTER — Telehealth: Payer: Self-pay

## 2023-07-16 NOTE — Telephone Encounter (Signed)
 Pharmacy Patient Advocate Encounter  Received notification from [employer] that Prior Authorization for Mounjaro  2.5  has been CANCELLED due to: An active PA is already on file with expiration date of [id number].   PA needed for Mounjaro  5MG . PA request has been Submitted. New Encounter has been or will be created for follow up. For additional info see Pharmacy Prior Auth telephone encounter from 07/16/23.

## 2023-07-16 NOTE — Telephone Encounter (Signed)
 Pharmacy Patient Advocate Encounter   Received notification from Pt Calls Messages that prior authorization for Mounjaro  5MG /0.5ML auto-injectors is required/requested.   Insurance verification completed.   The patient is insured through [employer] .   Per test claim: PA required; PA submitted to above mentioned insurance via CoverMyMeds Key/confirmation #/EOC [id number] Status is pending

## 2023-07-17 ENCOUNTER — Other Ambulatory Visit (HOSPITAL_COMMUNITY): Payer: Self-pay

## 2023-07-17 NOTE — Telephone Encounter (Signed)
 Pharmacy Patient Advocate Encounter  Received notification from [employer] that Prior Authorization for Mounjaro  5MG /0.5ML auto-injectors  has been APPROVED from 07/16/23 to 07/16/24. Ran test claim, Copay is $25. This test claim was processed through [facility]- copay amounts may vary at other pharmacies due to pharmacy/plan contracts, or as the patient moves through the different stages of their insurance plan.   PA #/Case ID/Reference #: [id number]

## 2023-07-19 ENCOUNTER — Other Ambulatory Visit: Payer: Self-pay | Admitting: General Practice

## 2023-07-19 DIAGNOSIS — I6330 Cerebral infarction due to thrombosis of unspecified cerebral artery: Secondary | ICD-10-CM

## 2023-09-08 ENCOUNTER — Ambulatory Visit: Admitting: General Practice

## 2023-09-08 ENCOUNTER — Ambulatory Visit: Payer: Self-pay | Admitting: General Practice

## 2023-09-08 ENCOUNTER — Encounter: Payer: Self-pay | Admitting: General Practice

## 2023-09-08 VITALS — BP 104/56 | HR 70 | Temp 98.0°F | Ht 73.5 in | Wt 248.0 lb

## 2023-09-08 DIAGNOSIS — N182 Chronic kidney disease, stage 2 (mild): Secondary | ICD-10-CM

## 2023-09-08 DIAGNOSIS — Z7985 Long-term (current) use of injectable non-insulin antidiabetic drugs: Secondary | ICD-10-CM

## 2023-09-08 DIAGNOSIS — J449 Chronic obstructive pulmonary disease, unspecified: Secondary | ICD-10-CM | POA: Diagnosis not present

## 2023-09-08 DIAGNOSIS — E1122 Type 2 diabetes mellitus with diabetic chronic kidney disease: Secondary | ICD-10-CM

## 2023-09-08 DIAGNOSIS — F17200 Nicotine dependence, unspecified, uncomplicated: Secondary | ICD-10-CM

## 2023-09-08 LAB — COMPREHENSIVE METABOLIC PANEL WITH GFR
ALT: 28 U/L (ref 0–53)
AST: 24 U/L (ref 0–37)
Albumin: 3.8 g/dL (ref 3.5–5.2)
Alkaline Phosphatase: 60 U/L (ref 39–117)
BUN: 29 mg/dL — ABNORMAL HIGH (ref 6–23)
CO2: 25 meq/L (ref 19–32)
Calcium: 8.9 mg/dL (ref 8.4–10.5)
Chloride: 100 meq/L (ref 96–112)
Creatinine, Ser: 1.12 mg/dL (ref 0.40–1.50)
GFR: 69.26 mL/min (ref >60.00)
Glucose, Bld: 98 mg/dL (ref 70–99)
Potassium: 4.2 meq/L (ref 3.5–5.1)
Sodium: 135 meq/L (ref 135–145)
Total Bilirubin: 0.4 mg/dL (ref 0.2–1.2)
Total Protein: 6.8 g/dL (ref 6.0–8.3)

## 2023-09-08 LAB — HEMOGLOBIN A1C: Hgb A1c MFr Bld: 6.5 % (ref 4.6–6.5)

## 2023-09-08 LAB — LIPID PANEL
Cholesterol: 80 mg/dL (ref 0–200)
HDL: 25.2 mg/dL — ABNORMAL LOW (ref >39.00)
LDL Cholesterol: 29 mg/dL (ref 0–99)
NonHDL: 54.5
Total CHOL/HDL Ratio: 3
Triglycerides: 126 mg/dL (ref 0.0–149.0)
VLDL: 25.2 mg/dL (ref 0.0–40.0)

## 2023-09-08 LAB — MICROALBUMIN / CREATININE URINE RATIO
Creatinine,U: 134.5 mg/dL
Microalb Creat Ratio: 15.9 mg/g (ref 0.0–30.0)
Microalb, Ur: 2.1 mg/dL — ABNORMAL HIGH (ref 0.0–1.9)

## 2023-09-08 MED ORDER — TIRZEPATIDE 5 MG/0.5ML ~~LOC~~ SOAJ: 5.0000 mg | SUBCUTANEOUS | 0 refills | Status: DC

## 2023-09-08 NOTE — Progress Notes (Signed)
 Established Patient Office Visit  Subjective   Patient ID: [name], male    DOB: 03-09-1960  Age: 63 y.o. MRN: [id number]  Chief Complaint  Patient presents with   Diabetes    Patient here today for DM f/u. Patient taking metformin  2 daily and mounjaro  5 mg weekly. Sugars are doing very good.     Diabetes Pertinent negatives for hypoglycemia include no dizziness, headaches or nervousness/anxiousness. Pertinent negatives for diabetes include no chest pain and no polydipsia.    [name] is a 63 year old male with past medical history of DM2, HTN, COPD, HLD, CKD, smoking presents today for a follow up.   Discussed the use of AI scribe software for clinical note transcription with the patient, who gave verbal consent to proceed.  History of Present Illness [name] is a 63 year old male with type 2 diabetes who presents for a follow-up visit to monitor his blood sugar levels and medication management.  He manages his type 2 diabetes with Mounjaro , 5 mg once a week, and metformin , 500 mg twice daily. He monitors his blood sugar levels daily, with readings typically ranging from the 80s to 130s, and a recent reading of 98 mg/dL. His last A1c was 6.9% in May, and he is due for a recheck today. He has made dietary changes, reducing intake and avoiding late-night snacks, and consumes diet sodas and sugar-free hydration drinks. He notes a decrease in appetite without experiencing nausea, vomiting, or gastrointestinal side effects from Mounjaro , but reports 'sulfuric burps' over the past couple of weeks. He has a history of below-the-knee amputation and engages in some outdoor activities, though not extensively. He is not drinking much water. He is on Crestor  for CVD protection. Urine ACR is due.  He continues to smoke and has a history of COPD and chronic bronchitis, with mild COPD noted on a lung x-ray from 2017.  He has declined the pneumonia vaccine.    Patient Active  Problem List   Diagnosis Date Noted   Injury of nail bed of toe 07/09/2023   Preoperative clearance 04/30/2023   Positive colorectal cancer screening using Cologuard test 04/23/2023   Anxiety 04/23/2023   Cerebral thrombosis with cerebral infarction 05/09/2021   Aphasia 05/08/2021   Leukocytosis 05/08/2021   Right below-knee amputee ([name]) 09/03/2019   Hyponatremia 08/26/2019   Cutaneous abscess of right foot    FHx: heart disease 10/08/2017   Type 2 diabetes mellitus with stage 2 chronic kidney disease, without long-term current use of insulin  ([name]) 10/08/2017   Insomnia 07/08/2017   Smoker 08/17/2015   COPD (chronic obstructive pulmonary disease) ([name]) 08/17/2015   CKD stage 2 due to type 2 diabetes mellitus ([name]) 08/31/2013   Medication management 05/19/2013   Morbid obesity (BMI 35) 05/19/2013   Essential hypertension    Hyperlipidemia associated with type 2 diabetes mellitus ([name])    Vitamin D  deficiency    History of kidney stones    Testosterone  deficiency    History of hepatitis C    Past Medical History:  Diagnosis Date   Diabetic neuropathy ([name])    Diverticulitis    GERD (gastroesophageal reflux disease)    History of hepatitis C    has been treated in the past   History of kidney stones    Hyperlipidemia    Hypertension    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[name])    Other testicular hypofunction    Severe sepsis ([name]) 08/26/2019   Stroke ([name])  Type II or unspecified type diabetes mellitus without mention of complication, not stated as uncontrolled    Vitamin D  deficiency    Past Surgical History:  Procedure Laterality Date   AMPUTATION Right 09/01/2019   Procedure: RIGHT BELOW KNEE AMPUTATION;  Surgeon: Harden Jerona GAILS, MD;  Location: [facility] OR;  Service: Orthopedics;  Laterality: Right;   CHOLECYSTECTOMY  1987   COLONOSCOPY     I & D EXTREMITY Right 08/20/2019   Procedure: IRRIGATION & DEBRIDEMENT RIGHT FOOT PARTIAL CALCANEAL EXCISION;  Surgeon: Harden Jerona GAILS, MD;   Location: [facility] OR;  Service: Orthopedics;  Laterality: Right;   I & D EXTREMITY Right 08/27/2019   Procedure: IRRIGATION AND DEBRIDEMENT  OF FOOT;  Surgeon: Jerri Kay HERO, MD;  Location: [facility] OR;  Service: Orthopedics;  Laterality: Right;   ORIF TIBIA FRACTURE     x 3 right leg   Allergies  Allergen Reactions   Invokana [Canagliflozin] Other (See Comments)    Extremity edema/caused pain   Codeine Camsylate [Codeine] Rash   Morphine And Codeine Rash         09/08/2023    8:53 AM 07/09/2023    8:55 AM 06/11/2023    8:37 AM  Depression screen PHQ 2/9  Decreased Interest 1 1 2   Down, Depressed, Hopeless 0 1 2  PHQ - 2 Score 1 2 4   Altered sleeping 1 1 2   Tired, decreased energy 1 1 3   Change in appetite 0 1 1  Feeling bad or failure about yourself  1 0 0  Trouble concentrating 1 2 2   Moving slowly or fidgety/restless 0 2 0  Suicidal thoughts 0 0 0  PHQ-9 Score 5 9 12   Difficult doing work/chores Somewhat difficult Not difficult at all Somewhat difficult       07/09/2023    8:56 AM 06/11/2023    8:39 AM 04/30/2023   11:03 AM 04/23/2023   11:43 AM  GAD 7 : Generalized Anxiety Score  Nervous, Anxious, on Edge 1 0 0 1  Control/stop worrying 1 1 1 1   Worry too much - different things 1 1 2 2   Trouble relaxing 0 1 1 2   Restless 0 0 1 0  Easily annoyed or irritable 1 2 2 2   Afraid - awful might happen 1 0 2 1  Total GAD 7 Score 5 5 9 9   Anxiety Difficulty Somewhat difficult Somewhat difficult Somewhat difficult Somewhat difficult      Review of Systems  Constitutional:  Negative for chills and fever.  Respiratory:  Negative for shortness of breath.   Cardiovascular:  Negative for chest pain.  Gastrointestinal:  Negative for abdominal pain, constipation, diarrhea, heartburn, nausea and vomiting.  Genitourinary:  Negative for dysuria, frequency and urgency.  Neurological:  Negative for dizziness and headaches.  Endo/Heme/Allergies:  Negative for polydipsia.  Psychiatric/Behavioral:   Negative for depression and suicidal ideas. The patient is not nervous/anxious.       Objective:     BP (!) 104/56   Pulse 70   Temp 98 F (36.7 C) (Temporal)   Ht 6' 1.5 (1.867 m)   Wt 248 lb (112.5 kg)   SpO2 98%   BMI 32.28 kg/m  BP Readings from Last 3 Encounters:  09/08/23 (!) 104/56  07/09/23 122/76  06/11/23 112/60   Wt Readings from Last 3 Encounters:  09/08/23 248 lb (112.5 kg)  07/09/23 257 lb (116.6 kg)  06/11/23 259 lb (117.5 kg)      Physical Exam Vitals  and nursing note reviewed.  Constitutional:      Appearance: Normal appearance.  Cardiovascular:     Rate and Rhythm: Normal rate and regular rhythm.     Pulses: Normal pulses.     Heart sounds: Normal heart sounds.  Pulmonary:     Effort: Pulmonary effort is normal.     Breath sounds: Normal breath sounds.  Neurological:     Mental Status: He is alert and oriented to person, place, and time.  Psychiatric:        Mood and Affect: Mood normal.        Behavior: Behavior normal.        Thought Content: Thought content normal.        Judgment: Judgment normal.      No results found for any visits on 09/08/23.     The ASCVD Risk score (Arnett DK, et al., 2019) failed to calculate for the following reasons:   Risk score cannot be calculated because patient has a medical history suggesting prior/existing ASCVD    Assessment & Plan:  Type 2 diabetes mellitus with stage 2 chronic kidney disease, without long-term current use of insulin  ([name]) -     Tirzepatide ; Inject 5 mg into the skin once a week.  Dispense: 2 mL; Refill: 0 -     Comprehensive metabolic panel with GFR -     Hemoglobin A1c -     Lipid panel -     Microalbumin / creatinine urine ratio  Chronic obstructive pulmonary disease, unspecified COPD type ([name])  Smoker    Assessment and Plan Assessment & Plan Type 2 diabetes mellitus with diabetic chronic kidney disease stage 2 Blood glucose well-controlled. Last A1c 6.9% in May.  Mounjaro  effective with minor side effects. Dietary changes noted.  - Recheck A1c today. - Recheck urine for kidney function. - Continue Mounjaro  5 mg weekly. RX sent. - Increase Mounjaro  to 7.5 mg after one month if needed. - Encourage increased water intake  - Discuss results and medication adjustment via MyChart or phone in two weeks.  Chronic obstructive pulmonary disease, mild Mild COPD with no current exacerbation symptoms. Smoking cessation advised to prevent flare-ups and reduce cancer risk. - Encourage smoking cessation. - Monitor for symptoms of COPD exacerbation, such as increased shortness of breath or difficulty breathing.  Tobacco use disorder Continues smoking despite health risks. Prefers to quit independently. Smoking cessation crucial to reduce COPD and cancer risk. - Encourage smoking cessation. - Offer support and resources for smoking cessation if desired.    Return in about 3 months (around 12/09/2023) for chronic care management.[name] Carrol Aurora, NP

## 2023-09-08 NOTE — Patient Instructions (Signed)
 Stop by the lab prior to leaving today. I will notify you of your results once received.   Please update me when you have two injections left of the Mounjaro .   I will see you in 3 months.   It was a pleasure to see you today!

## 2023-09-17 ENCOUNTER — Encounter: Payer: Self-pay | Admitting: Internal Medicine

## 2023-09-18 ENCOUNTER — Encounter: Payer: Self-pay | Admitting: Internal Medicine

## 2023-09-23 ENCOUNTER — Telehealth: Payer: Self-pay | Admitting: General Practice

## 2023-09-23 ENCOUNTER — Telehealth: Payer: Self-pay

## 2023-09-23 ENCOUNTER — Other Ambulatory Visit (HOSPITAL_COMMUNITY): Payer: Self-pay

## 2023-09-23 DIAGNOSIS — E1122 Type 2 diabetes mellitus with diabetic chronic kidney disease: Secondary | ICD-10-CM

## 2023-09-23 MED ORDER — TIRZEPATIDE 7.5 MG/0.5ML ~~LOC~~ SOAJ
7.5000 mg | SUBCUTANEOUS | 1 refills | Status: DC
Start: 2023-09-23 — End: 2023-11-27

## 2023-09-23 NOTE — Telephone Encounter (Signed)
 Spoke with patients wife; okay per DPR and notified rx was sent in.

## 2023-09-23 NOTE — Telephone Encounter (Signed)
 Copied from CRM [id number]. Topic: Clinical - Medication Refill >> Sep 23, 2023 10:57 AM Donna BRAVO wrote: Patient wife Verneita calling requesting medication refill  Medication: patient wants to up  next higher dosage  to 7.5 MG  tirzepatide  (MOUNJARO ) 5 MG/0.5ML Pen  Has the patient contacted their pharmacy? No Dosage change  This is the patient's preferred pharmacy:   [id number], [place] - [address] [place] [address] Phone: [phone] Fax: [phone]   Is this the correct pharmacy for this prescription? Yes If no, delete pharmacy and type the correct one.   Has the prescription been filled recently? Yes  Is the patient out of the medication? No  Has the patient been seen for an appointment in the last year OR does the patient have an upcoming appointment? Yes  Can we respond through MyChart? Yes  Agent: Please be advised that Rx refills may take up to 3 business days. We ask that you follow-up with your pharmacy.

## 2023-09-23 NOTE — Telephone Encounter (Signed)
 Pharmacy Patient Advocate Encounter   Received notification from Onbase that prior authorization for Mounjaro  7.5 is required/requested.   Insurance verification completed.   The patient is insured through [employer] .   Per test claim: PA required; PA submitted to above mentioned insurance via Latent Key/confirmation #/EOC [id number] Status is pending

## 2023-09-24 ENCOUNTER — Other Ambulatory Visit (HOSPITAL_COMMUNITY): Payer: Self-pay

## 2023-09-24 ENCOUNTER — Other Ambulatory Visit: Payer: Self-pay | Admitting: General Practice

## 2023-09-24 DIAGNOSIS — E1122 Type 2 diabetes mellitus with diabetic chronic kidney disease: Secondary | ICD-10-CM

## 2023-09-24 NOTE — Telephone Encounter (Signed)
 Notified patient that rx was approved and ready for pickup at pharmacy

## 2023-09-24 NOTE — Telephone Encounter (Signed)
 PA needed for Mounjaro  7.5 mg per pharmacy. Please start for patient.

## 2023-09-24 NOTE — Telephone Encounter (Signed)
 Pharmacy Patient Advocate Encounter  Received notification from [employer] that Prior Authorization for Mounjaro  7.5 has been APPROVED from 09/23/23 to 09/22/24. Unable to obtain price due to refill too soon rejection, last fill date 09/08/23 next available fill date 10/01/23   PA #/Case ID/Reference #: # [id number]

## 2023-09-25 ENCOUNTER — Other Ambulatory Visit (HOSPITAL_COMMUNITY): Payer: Self-pay

## 2023-09-29 ENCOUNTER — Other Ambulatory Visit: Payer: Self-pay | Admitting: General Practice

## 2023-09-29 DIAGNOSIS — E1122 Type 2 diabetes mellitus with diabetic chronic kidney disease: Secondary | ICD-10-CM

## 2023-09-29 DIAGNOSIS — I6330 Cerebral infarction due to thrombosis of unspecified cerebral artery: Secondary | ICD-10-CM

## 2023-09-30 NOTE — Telephone Encounter (Signed)
 Pharmacy needs PA done on Mounjaro  7.5 mg

## 2023-10-01 ENCOUNTER — Other Ambulatory Visit (HOSPITAL_COMMUNITY): Payer: Self-pay

## 2023-10-05 ENCOUNTER — Other Ambulatory Visit: Payer: Self-pay | Admitting: General Practice

## 2023-10-05 DIAGNOSIS — K219 Gastro-esophageal reflux disease without esophagitis: Secondary | ICD-10-CM

## 2023-10-06 ENCOUNTER — Other Ambulatory Visit: Payer: Self-pay | Admitting: General Practice

## 2023-10-06 DIAGNOSIS — K219 Gastro-esophageal reflux disease without esophagitis: Secondary | ICD-10-CM

## 2023-10-06 MED ORDER — PANTOPRAZOLE SODIUM 40 MG PO TBEC
DELAYED_RELEASE_TABLET | ORAL | 3 refills | Status: AC
Start: 2023-10-06 — End: ?

## 2023-10-06 NOTE — Telephone Encounter (Signed)
 Copied from CRM [id number]. Topic: Clinical - Medication Refill >> Oct 06, 2023 12:20 PM Fonda T wrote: Medication: pantoprazole  (PROTONIX ) 40 MG tablet  Has the patient contacted their pharmacy? Yes, per pharmacy advised to contact office   This is the patient's preferred pharmacy:  [phone], [place] - [address] [place] [address] Phone: [phone] Fax: [phone]   Is this the correct pharmacy for this prescription? Yes If no, delete pharmacy and type the correct one.   Has the prescription been filled recently? Yes  Is the patient out of the medication? Yes, taking last pill today  Has the patient been seen for an appointment in the last year OR does the patient have an upcoming appointment? Yes  Can we respond through MyChart? No, prefers a phone call at [phone]  Agent: Please be advised that Rx refills may take up to 3 business days. We ask that you follow-up with your pharmacy.

## 2023-10-16 ENCOUNTER — Other Ambulatory Visit: Payer: Self-pay | Admitting: General Practice

## 2023-10-16 DIAGNOSIS — I10 Essential (primary) hypertension: Secondary | ICD-10-CM

## 2023-10-27 ENCOUNTER — Telehealth: Payer: Self-pay | Admitting: General Practice

## 2023-10-27 NOTE — Telephone Encounter (Signed)
 Copied from CRM #[id number]. Topic: General - Registration Update >> Oct 27, 2023 10:25 AM Kevelyn M wrote: Patient's wife calling in to update the patient insurance information. He now has health team advantage with Medicare. I was unable to locate this insurance.  Call back# [phone]

## 2023-11-05 ENCOUNTER — Ambulatory Visit: Admitting: Podiatry

## 2023-11-06 ENCOUNTER — Ambulatory Visit: Admitting: Physician Assistant

## 2023-11-06 DIAGNOSIS — Z89511 Acquired absence of right leg below knee: Secondary | ICD-10-CM | POA: Diagnosis not present

## 2023-11-06 NOTE — Progress Notes (Addendum)
 Office Visit Note   Patient: [name]           Date of Birth: 09-24-1960           MRN: [id number] Visit Date: 11/06/2023              Requested by: Vincente Shivers, NP [address] [place],  [place] [address] PCP: Vincente Shivers, NP  Chief Complaint  Patient presents with   Right Leg - Follow-up    [name], needs new prosthetic Rx.      HPI: 63 y/o male who is a highly functioning [name].  His amputation was performed on 09/01/19.  He is here for prothesis needs.   His current liner is worn out and broken down. Getting a poor seal and causing him pain. Would like an order for a new liner and supplies   Assessment & Plan: Visit Diagnoses: No diagnosis found.  Plan: Given an order for new suction style prosthesis and supplies. He will follow-up as needed   Follow-Up Instructions: No follow-ups on file.   Ortho Exam  Patient is alert, oriented, no adenopathy, well-dressed, normal affect, normal respiratory effort.  His stump is viable without open wounds or edema or cellulitis.    Patient is an existing right transtibial  amputee.  Patient's current comorbidities are not expected to impact the ability to function with the prescribed prosthesis. Patient verbally communicates a strong desire to use a prosthesis. Patient currently requires mobility aids to ambulate without a prosthesis.  Expects not to use mobility aids with a new prosthesis.  Patient is an existing right transtibial  amputee.  Patient's current comorbidities are not expected to impact the ability to function with the prescribed prosthesis. Patient verbally communicates a strong desire to use a prosthesis. Patient currently requires mobility aids to ambulate without a prosthesis.  Expects not to use mobility aids with a new prosthesis. Patient is expected to resume or reach their K Level within 6 months. Patient was active before the amputation and independent with stairs, uneven terrain,  varying cadence, and a community ambulator.  Patient is a K3 level ambulator that spends a lot of time walking around on uneven terrain over obstacles, up and down stairs, and ambulates with a variable cadence.    Imaging: No results found. No images are attached to the encounter.  Labs: Lab Results  Component Value Date   HGBA1C 6.5 09/08/2023   HGBA1C 6.9 (A) 06/11/2023   HGBA1C 6.8 (H) 12/12/2022   LABURIC 6.0 05/09/2021   REPTSTATUS 09/06/2019 FINAL 09/01/2019   GRAMSTAIN  09/01/2019    MODERATE WBC PRESENT, PREDOMINANTLY PMN NO ORGANISMS SEEN    CULT  09/01/2019    No growth aerobically or anaerobically. Performed at [facility], [address], [place], [place] [address]    [facility] STREPTOCOCCUS ANGINOSIS 08/20/2019     Lab Results  Component Value Date   ALBUMIN 3.8 09/08/2023   ALBUMIN 3.9 02/24/2023   ALBUMIN 3.8 05/07/2021    Lab Results  Component Value Date   MG 1.9 09/10/2022   MG 2.0 07/19/2021   MG 2.2 01/18/2021   Lab Results  Component Value Date   VD25OH 66 09/10/2022   VD25OH 74 07/19/2021   VD25OH 67 01/18/2021    No results found for: PREALBUMIN    Latest Ref Rng & Units 02/24/2023   10:58 AM 12/12/2022    4:39 PM 09/10/2022    3:35 PM  CBC  EXTENDED  WBC 4.0 - 10.5 K/uL 10.6  13.1  12.4   RBC 4.22 - 5.81 MIL/uL 4.62  4.56  4.62   Hemoglobin 13.0 - 17.0 g/dL 85.6  85.4  85.1   HCT 39.0 - 52.0 % 42.6  42.0  42.4   Platelets 150 - 400 K/uL 167  220  196   NEUT# 1.7 - 7.7 K/uL 6.4  7,480  7,068   Lymph# 0.7 - 4.0 K/uL 3.0   3,968      There is no height or weight on file to calculate BMI.  Orders:  No orders of the defined types were placed in this encounter.  No orders of the defined types were placed in this encounter.    Procedures: No procedures performed  Clinical Data: No additional findings.  ROS:  All other systems negative, except as noted in the HPI. Review of Systems  Objective: Vital Signs: There  were no vitals taken for this visit.  Specialty Comments:  No specialty comments available.  PMFS History: Patient Active Problem List   Diagnosis Date Noted   Injury of nail bed of toe 07/09/2023   Preoperative clearance 04/30/2023   Positive colorectal cancer screening using Cologuard test 04/23/2023   Anxiety 04/23/2023   Cerebral thrombosis with cerebral infarction 05/09/2021   Aphasia 05/08/2021   Leukocytosis 05/08/2021   Right below-knee amputee ([name]) 09/03/2019   Hyponatremia 08/26/2019   Cutaneous abscess of right foot    FHx: heart disease 10/08/2017   Type 2 diabetes mellitus with stage 2 chronic kidney disease, without long-term current use of insulin  ([name]) 10/08/2017   Insomnia 07/08/2017   Smoker 08/17/2015   COPD (chronic obstructive pulmonary disease) ([name]) 08/17/2015   CKD stage 2 due to type 2 diabetes mellitus ([name]) 08/31/2013   Medication management 05/19/2013   Morbid obesity (BMI 35) 05/19/2013   Essential hypertension    Hyperlipidemia associated with type 2 diabetes mellitus ([name])    Vitamin D  deficiency    History of kidney stones    Testosterone  deficiency    History of hepatitis C    Past Medical History:  Diagnosis Date   Diabetic neuropathy ([name])    Diverticulitis    GERD (gastroesophageal reflux disease)    History of hepatitis C    has been treated in the past   History of kidney stones    Hyperlipidemia    Hypertension    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[name])    Other testicular hypofunction    Severe sepsis ([name]) 08/26/2019   Stroke ([name])    Type II or unspecified type diabetes mellitus without mention of complication, not stated as uncontrolled    Vitamin D  deficiency     Family History  Problem Relation Age of Onset   Diabetes Mother    Hypertension Mother    Cancer Father        colon   Alzheimer's disease Father    Stroke Father    Diabetes Sister    Colon cancer Neg Hx    Esophageal cancer Neg Hx    Rectal cancer Neg Hx     Stomach cancer Neg Hx     Past Surgical History:  Procedure Laterality Date   AMPUTATION Right 09/01/2019   Procedure: RIGHT BELOW KNEE AMPUTATION;  Surgeon: Harden Jerona GAILS, MD;  Location: [facility] OR;  Service: Orthopedics;  Laterality: Right;   CHOLECYSTECTOMY  1987   COLONOSCOPY     I & D EXTREMITY Right 08/20/2019   Procedure:  IRRIGATION & DEBRIDEMENT RIGHT FOOT PARTIAL CALCANEAL EXCISION;  Surgeon: Harden Jerona GAILS, MD;  Location: [facility] OR;  Service: Orthopedics;  Laterality: Right;   I & D EXTREMITY Right 08/27/2019   Procedure: IRRIGATION AND DEBRIDEMENT  OF FOOT;  Surgeon: Jerri Kay HERO, MD;  Location: [facility] OR;  Service: Orthopedics;  Laterality: Right;   ORIF TIBIA FRACTURE     x 3 right leg   Social History   Occupational History   Not on file  Tobacco Use   Smoking status: Some Days    Current packs/day: 0.00    Types: Cigarettes    Last attempt to quit: 05/07/2021    Years since quitting: 2.5   Smokeless tobacco: Never   Tobacco comments:    Started smoking at 14 with ppd and then increased to 2 PPD until two years ago now smokes 1 PPD.  Vaping Use   Vaping status: Never Used  Substance and Sexual Activity   Alcohol use: No   Drug use: Yes    Types: Marijuana   Sexual activity: Yes

## 2023-11-09 ENCOUNTER — Encounter: Payer: Self-pay | Admitting: Physician Assistant

## 2023-11-12 ENCOUNTER — Ambulatory Visit: Payer: Self-pay | Admitting: General Practice

## 2023-11-12 NOTE — Telephone Encounter (Signed)
 FYI Only or Action Required?: FYI only for provider.  Patient was last seen in primary care on 09/08/2023 by Vincente Shivers, NP.  Called Nurse Triage reporting Hip Pain.  Symptoms began several weeks ago.  Interventions attempted: Nothing.  Triage Disposition: See Physician Within 24 Hours  Patient/caregiver understands and will follow disposition?: Yes        Copied from CRM #[id number]. Topic: Clinical - Red Word Triage >> Nov 12, 2023  4:34 PM [name] wrote: Red Word that prompted transfer to Nurse Triage: pt wife called on behalf of pt stating unsure what happened with his hip but is experiencing pain and unable to walk Reason for Disposition  [1] MODERATE pain (e.g., interferes with normal activities, limping) AND [2] high-risk adult (e.g., age > 60 years, osteoporosis, chronic steroid use)  Answer Assessment - Initial Assessment Questions This RN spoke with pt's wife, [name]. This RN scheduled pt for an appointment tomorrow in office.   Left hip pain Intermittent, 10/10 pain level [place] which is when the pain started; it was like a pulled muscle Pt has been using his walker Pt can walk but is slow  APPEARANCE of INJURY: What does the injury look like?  (e.g., deformity of leg)     Denies deformity, bruising  Protocols used: Hip Injury-A-AH

## 2023-11-13 ENCOUNTER — Encounter: Payer: Self-pay | Admitting: Family Medicine

## 2023-11-13 ENCOUNTER — Ambulatory Visit: Admitting: Family Medicine

## 2023-11-13 VITALS — BP 100/60 | HR 74 | Temp 97.7°F | Ht 73.5 in | Wt 244.1 lb

## 2023-11-13 DIAGNOSIS — R1032 Left lower quadrant pain: Secondary | ICD-10-CM | POA: Diagnosis not present

## 2023-11-13 DIAGNOSIS — E1122 Type 2 diabetes mellitus with diabetic chronic kidney disease: Secondary | ICD-10-CM

## 2023-11-13 DIAGNOSIS — N182 Chronic kidney disease, stage 2 (mild): Secondary | ICD-10-CM

## 2023-11-13 MED ORDER — DICLOFENAC SODIUM 75 MG PO TBEC: 75.0000 mg | DELAYED_RELEASE_TABLET | Freq: Two times a day (BID) | ORAL | 0 refills | Status: AC

## 2023-11-13 MED ORDER — ONETOUCH VERIO VI STRP: ORAL_STRIP | 5 refills | Status: DC

## 2023-11-13 NOTE — Telephone Encounter (Signed)
 Patient saw Dr. Avelina this morning.

## 2023-11-13 NOTE — Progress Notes (Signed)
 Patient ID: [name], male    DOB: 08/08/1960, 63 y.o.   MRN: [id number]  This visit was conducted in person.  BP 100/60   Pulse 74   Temp 97.7 F (36.5 C) (Temporal)   Ht 6' 1.5 (1.867 m)   Wt 244 lb 2 oz (110.7 kg)   SpO2 99%   BMI 31.77 kg/m    CC:  Chief Complaint  Patient presents with   Hip Pain    Left x 3 weeks Feels like his strain something and can't lift his left leg    Subjective:   HPI: [name] is a 63 y.o. male  pt of [facility], NP presenting on 11/13/2023 for Hip Pain (Left x 3 weeks/Feels like his strain something and can't lift his left leg)   New onset left hip pain x  3 weeks.   Pain in left anterior  groin with trying to raise leg.  No abd pain, no pain at rest.  No swelling, no skin change.  No testicular pain, no stomach pain.  No urinary issues.   Occurred [place] h[place]. Was using walking stick.   No fall.  He has [name].    Has not treated it with anything.    GFR 69 08/2023  Relevant past medical, surgical, family and social history reviewed and updated as indicated. Interim medical history since our last visit reviewed. Allergies and medications reviewed and updated. Outpatient Medications Prior to Visit  Medication Sig Dispense Refill   cetirizine (ZYRTEC) 10 MG tablet Take 10 mg by mouth daily as needed for allergies.     Cholecalciferol (VITAMIN D ) 50 MCG (2000 UT) CAPS Take 2,000 Units by mouth 2 (two) times daily.     citalopram  (CELEXA ) 40 MG tablet TAKE 1 TABLET BY MOUTH ONCE DAILY FOR  MOOD  AND  CHRONIC  ANXIETY 30 tablet 2   clopidogrel  (PLAVIX ) 75 MG tablet TAKE 1 TABLET DAILY TO PREVENT BLOOD CLOTS & STROKES 30 tablet 2   cyanocobalamin  (VITAMIN B12) 500 MCG tablet Take 5,000 mcg by mouth daily. Pt is taking one tablet twice weekly     hydrochlorothiazide  (HYDRODIURIL ) 25 MG tablet Take 12.5 mg by mouth daily.     lisinopril  (ZESTRIL ) 10 MG tablet TAKE 1 TABLET BY MOUTH EVERY DAY  30 tablet 2   mupirocin  ointment (BACTROBAN ) 2 % Apply 1 Application topically daily as needed.     pantoprazole  (PROTONIX ) 40 MG tablet Take  1 tablet   Daily   to Prevent Heartburn & Indigestion 90 tablet 3   rosuvastatin  (CRESTOR ) 40 MG tablet TAKE ONE TABLET BY MOUTH DAILY FOR CHOLESTEROL 30 tablet 11   tirzepatide  (MOUNJARO ) 7.5 MG/0.5ML Pen Inject 7.5 mg into the skin once a week. 2 mL 1   verapamil  (CALAN -SR) 240 MG CR tablet Take 1 tablet  Daily with a Meal for BP & Heart Rhythm                                                             /  TAKE                                         BY                                                 MOUTH 90 tablet 3   Zinc 50 MG TABS Take by mouth.     hydrochlorothiazide  (HYDRODIURIL ) 25 MG tablet TAKE ONE TABLET BY MOUTH DAILY FOR BLOOD PRESSURE,FLUID RETENTION,ANKLE SWELLING (Patient taking differently: Pt is taking 1/2 TABLET BY MOUTH DAILY FOR BLOOD PRESSURE,FLUID RETENTION,ANKLE SWELLING) 30 tablet 11   metFORMIN  (GLUCOPHAGE -XR) 500 MG 24 hr tablet Take  1 tablet in the morning and 2 in the evening with Meals  for Diabetes                                             /                                                                   TAKE                                         BY                                                 MOUTH (Patient taking differently: Take 500 mg by mouth 2 (two) times daily with a meal.)     No facility-administered medications prior to visit.     Per HPI unless specifically indicated in ROS section below Review of Systems  Constitutional:  Negative for fatigue and fever.  HENT:  Negative for ear pain.   Eyes:  Negative for pain.  Respiratory:  Negative for cough and shortness of breath.   Cardiovascular:  Negative for chest pain, palpitations and leg swelling.  Gastrointestinal:  Negative for abdominal pain.  Genitourinary:  Negative for dysuria.   Musculoskeletal:  Negative for arthralgias.  Neurological:  Negative for syncope, light-headedness and headaches.  Psychiatric/Behavioral:  Negative for dysphoric mood.    Objective:  BP 100/60   Pulse 74   Temp 97.7 F (36.5 C) (Temporal)   Ht 6' 1.5 (1.867 m)   Wt 244 lb 2 oz (110.7 kg)   SpO2 99%   BMI 31.77 kg/m   Wt Readings from Last 3 Encounters:  11/13/23 244 lb 2 oz (110.7 kg)  09/08/23 248 lb (112.5 kg)  07/09/23 257 lb (116.6 kg)      Physical Exam Vitals reviewed.  Constitutional:      Appearance: He is well-developed.  HENT:     Head: Normocephalic.     Right Ear: Hearing normal.  Left Ear: Hearing normal.     Nose: Nose normal.  Neck:     Thyroid : No thyroid  mass or thyromegaly.     Vascular: No carotid bruit.     Trachea: Trachea normal.  Cardiovascular:     Rate and Rhythm: Normal rate and regular rhythm.     Pulses: Normal pulses.     Heart sounds: Heart sounds not distant. No murmur heard.    No friction rub. No gallop.     Comments: No peripheral edema Pulmonary:     Effort: Pulmonary effort is normal. No respiratory distress.     Breath sounds: Normal breath sounds.  Musculoskeletal:     Right hip: No deformity, lacerations, tenderness or bony tenderness. Normal range of motion.     Left hip: No deformity, lacerations, tenderness or bony tenderness. Normal range of motion.     Comments: [name]  Pain with left hip flexion .[name] No focal area of tenderness to palpation  Good   left hip oint ROM without pain  Skin:    General: Skin is warm and dry.     Findings: No rash.  Psychiatric:        Speech: Speech normal.        Behavior: Behavior normal.        Thought Content: Thought content normal.       Results for orders placed or performed in visit on 09/08/23  Comprehensive metabolic panel with GFR   Collection Time: 09/08/23  9:27 AM  Result Value Ref Range   Sodium 135 135 - 145 mEq/L   Potassium 4.2 3.5 - 5.1 mEq/L   Chloride  100 96 - 112 mEq/L   CO2 25 19 - 32 mEq/L   Glucose, Bld 98 70 - 99 mg/dL   BUN 29 (H) 6 - 23 mg/dL   Creatinine, Ser 8.87 0.40 - 1.50 mg/dL   Total Bilirubin 0.4 0.2 - 1.2 mg/dL   Alkaline Phosphatase 60 39 - 117 U/L   AST 24 0 - 37 U/L   ALT 28 0 - 53 U/L   Total Protein 6.8 6.0 - 8.3 g/dL   Albumin 3.8 3.5 - 5.2 g/dL   GFR 30.73 >39.99 mL/min   Calcium  8.9 8.4 - 10.5 mg/dL  Hemoglobin J8r   Collection Time: 09/08/23  9:27 AM  Result Value Ref Range   Hgb A1c MFr Bld 6.5 4.6 - 6.5 %  Lipid panel   Collection Time: 09/08/23  9:27 AM  Result Value Ref Range   Cholesterol 80 0 - 200 mg/dL   Triglycerides 873.9 0.0 - 149.0 mg/dL   HDL 74.79 (L) >60.99 mg/dL   VLDL 74.7 0.0 - 59.9 mg/dL   LDL Cholesterol 29 0 - 99 mg/dL   Total CHOL/HDL Ratio 3    NonHDL 54.50   Microalbumin / creatinine urine ratio   Collection Time: 09/08/23  9:27 AM  Result Value Ref Range   Microalb, Ur 2.1 (H) 0.0 - 1.9 mg/dL   Creatinine,U 865.4 mg/dL   Microalb Creat Ratio 15.9 0.0 - 30.0 mg/g    Assessment and Plan  Type 2 diabetes mellitus with stage 2 chronic kidney disease, without long-term current use of insulin  ([name]) -     OneTouch Verio; Use to check blood sugar 2 times a day.  Dispense: 100 each; Refill: 5  Left groin pain Assessment & Plan:  Acute, most consistent with left groin strain following [place] the [place]. Will treat with diclofenac   75 mg p.o. twice daily.  Apply heat.  Information on home physical therapy given for him to begin 2-3 times daily.  No red flags and no indication for imaging. Return and ER precautions provided.   Other orders -     Diclofenac  Sodium; Take 1 tablet (75 mg total) by mouth 2 (two) times daily.  Dispense: 30 tablet; Refill: 0    No follow-ups on file.   Greig Ring, MD

## 2023-11-13 NOTE — Assessment & Plan Note (Addendum)
 Acute, most consistent with left groin strain [place]. Will treat with diclofenac  75 mg p.o. twice daily.  Apply heat.  Information on home physical therapy given for him to begin 2-3 times daily.  No red flags and no indication for imaging. Return and ER precautions provided.

## 2023-11-17 DIAGNOSIS — H25813 Combined forms of age-related cataract, bilateral: Secondary | ICD-10-CM | POA: Diagnosis not present

## 2023-11-17 DIAGNOSIS — H35363 Drusen (degenerative) of macula, bilateral: Secondary | ICD-10-CM | POA: Diagnosis not present

## 2023-11-17 DIAGNOSIS — E1122 Type 2 diabetes mellitus with diabetic chronic kidney disease: Secondary | ICD-10-CM | POA: Diagnosis not present

## 2023-11-17 DIAGNOSIS — N182 Chronic kidney disease, stage 2 (mild): Secondary | ICD-10-CM | POA: Diagnosis not present

## 2023-11-17 DIAGNOSIS — H40023 Open angle with borderline findings, high risk, bilateral: Secondary | ICD-10-CM | POA: Diagnosis not present

## 2023-11-17 LAB — HM DIABETES EYE EXAM

## 2023-11-18 ENCOUNTER — Telehealth: Payer: Self-pay

## 2023-11-18 NOTE — Telephone Encounter (Signed)
 Copied from CRM [id number]. Topic: General - Other >> Nov 18, 2023  9:29 AM Rosina BIRCH wrote: Reason for CRM: patient wife called stating the patient need a prior authorization for mounjaro  for his next refill and she was told to call us  by the insurance  [phone]

## 2023-11-19 ENCOUNTER — Other Ambulatory Visit (HOSPITAL_COMMUNITY): Payer: Self-pay

## 2023-11-19 ENCOUNTER — Telehealth: Payer: Self-pay | Admitting: General Practice

## 2023-11-19 ENCOUNTER — Telehealth: Payer: Self-pay

## 2023-11-19 NOTE — Telephone Encounter (Signed)
 Pharmacy Patient Advocate Encounter   Received notification from Pt Calls Messages that prior authorization for Mounjaro  7.5 is required/requested.   Insurance verification completed.   The patient is insured through [facility] ADVANTAGE/RX ADVANCE.   Patient has current approved PA that expires 09/22/24 Per test claim: Refill too soon. PA is not needed at this time. Medication was filled 11/01/23. Next eligible fill date is 11/22/23.

## 2023-11-19 NOTE — Telephone Encounter (Signed)
 Duplicate PA note.

## 2023-11-19 NOTE — Telephone Encounter (Signed)
 Spoke with patients wife and original PA was done thru [employer] in September but patient now has [employer] as insurance. Patient needs a new PA done thru them for his next refill on 11/22/23. Can new PA be done thru [facility]?

## 2023-11-19 NOTE — Telephone Encounter (Signed)
 Copied from CRM [id number]. Topic: General - Other >> Nov 18, 2023  4:56 PM [name] wrote: Reason for CRM: [employer] Insurance needs a prior authorization for tirzepatide  (MOUNJARO ) 7.5 MG/0.5ML Pen,  Also needs diagnosis need for medication and A1c levels and copy of lab results of A1c levels .  Fax number [phone] ID number [id number]

## 2023-11-20 NOTE — Telephone Encounter (Signed)
 Patients wife spoke with [employer] and they told her they only gave the patient a complimentary 30 day supply of mounjaro  and would need a PA done in order for him to continue this. I know below is said it was because his refill is too soon. Can the PA be done next week?

## 2023-11-24 ENCOUNTER — Encounter: Payer: Self-pay | Admitting: Radiology

## 2023-11-24 ENCOUNTER — Other Ambulatory Visit (HOSPITAL_COMMUNITY): Payer: Self-pay

## 2023-11-24 ENCOUNTER — Telehealth: Payer: Self-pay

## 2023-11-24 DIAGNOSIS — H2513 Age-related nuclear cataract, bilateral: Secondary | ICD-10-CM | POA: Diagnosis not present

## 2023-11-24 DIAGNOSIS — H40013 Open angle with borderline findings, low risk, bilateral: Secondary | ICD-10-CM | POA: Diagnosis not present

## 2023-11-24 NOTE — Telephone Encounter (Signed)
 Was it denied because of it being ran too soon last week?

## 2023-11-24 NOTE — Telephone Encounter (Signed)
 Pharmacy Patient Advocate Encounter   Received notification from Pt Calls Messages that prior authorization for Mounjaro  7.5 is due for renewal.   Insurance verification completed.   The patient is insured through [facility] ADVANTAGE/RX ADVANCE.  PA request cancelled due to previous denial. Spoke with Fahill at [employer]. Denial letter being faxed, can resubmit through appeal process. Please advise

## 2023-11-24 NOTE — Telephone Encounter (Signed)
 Please start PA for [name] 7.5 mg

## 2023-11-25 ENCOUNTER — Telehealth: Payer: Self-pay | Admitting: Pharmacist

## 2023-11-25 ENCOUNTER — Other Ambulatory Visit (HOSPITAL_COMMUNITY): Payer: Self-pay

## 2023-11-25 ENCOUNTER — Telehealth: Payer: Self-pay

## 2023-11-25 NOTE — Telephone Encounter (Signed)
 Copied from CRM [id number]. Topic: Clinical - Medication Question >> Nov 25, 2023 11:52 AM Corin V wrote: Reason for CRM: Rosaline with [facility] called to advise they are going to be treating patient for extractions and peridontal disease and they need to get something from PCP on instructions for his blood thinner medications, whether to stop, pause, or take as normal. Fax: [phone], Callback: [phone]

## 2023-11-25 NOTE — Telephone Encounter (Signed)
 Called and spoke with dentist office and they are wanting to do some scaling, root planning and 1 tooth extraction at one time then bring patient back in 2-3 weeks to extract the complete upper teeth in another visit. Rosaline stated they can move the 2nd visit out further if needed depending on protocal for his blood thinner. Rosaline needs to know if patient needs to stop, pause or continue on normal taking his blood thinner? Rosaline also stated decision needs to be on letter head and faxed to them.

## 2023-11-25 NOTE — Telephone Encounter (Signed)
 Please start Appeal process for patients mounjaro 

## 2023-11-25 NOTE — Telephone Encounter (Signed)
 Verbal appeal has been submitted to [facility] for Mounjaro  7.5MG /0.5ML auto-injectors.  All supporting documentation have been faxed to [phone] on  11/25/2023 @2 :52 pm.  Appeal #[id number]   Thank you, Devere Pandy, PharmD Clinical Pharmacist  [place]  Direct Dial: [phone]

## 2023-11-26 NOTE — Telephone Encounter (Signed)
 Copied from CRM [id number]. Topic: Clinical - Prescription Issue >> Nov 26, 2023  4:42 PM Dedra B wrote: Reason for CRM: Pt wife, Verneita, said pt's insurance denied his Mounjaro . Pls call Verneita at [phone]

## 2023-11-27 ENCOUNTER — Other Ambulatory Visit: Payer: Self-pay | Admitting: General Practice

## 2023-11-27 DIAGNOSIS — E1122 Type 2 diabetes mellitus with diabetic chronic kidney disease: Secondary | ICD-10-CM

## 2023-11-27 NOTE — Telephone Encounter (Signed)
 Rx is currently in the appeal process; patients wife has been notified of this.

## 2023-11-27 NOTE — Telephone Encounter (Signed)
 Note has been done and faxed to dentist office.

## 2023-11-28 ENCOUNTER — Other Ambulatory Visit (HOSPITAL_COMMUNITY): Payer: Self-pay

## 2023-12-01 ENCOUNTER — Telehealth: Payer: Self-pay | Admitting: Pharmacist

## 2023-12-01 NOTE — Telephone Encounter (Signed)
 The appeal for Mounjaro  has been approved by the insurance through 11/25/2024.  Full letter can be found under the media tab.  Thank you, Devere Pandy, PharmD Clinical Pharmacist  [place]  Direct Dial: [phone]

## 2023-12-01 NOTE — Telephone Encounter (Signed)
 Appeal was approved in another TE message.

## 2023-12-01 NOTE — Telephone Encounter (Signed)
 Patients wife has been notified of approval.

## 2023-12-04 DIAGNOSIS — H2511 Age-related nuclear cataract, right eye: Secondary | ICD-10-CM | POA: Diagnosis not present

## 2023-12-04 DIAGNOSIS — H25811 Combined forms of age-related cataract, right eye: Secondary | ICD-10-CM | POA: Diagnosis not present

## 2023-12-04 DIAGNOSIS — I10 Essential (primary) hypertension: Secondary | ICD-10-CM | POA: Diagnosis not present

## 2023-12-08 ENCOUNTER — Encounter: Payer: Self-pay | Admitting: Podiatry

## 2023-12-08 ENCOUNTER — Ambulatory Visit: Admitting: Podiatry

## 2023-12-08 DIAGNOSIS — L600 Ingrowing nail: Secondary | ICD-10-CM

## 2023-12-08 DIAGNOSIS — B351 Tinea unguium: Secondary | ICD-10-CM | POA: Diagnosis not present

## 2023-12-09 ENCOUNTER — Encounter: Payer: Self-pay | Admitting: Pharmacist

## 2023-12-09 ENCOUNTER — Encounter: Payer: Self-pay | Admitting: General Practice

## 2023-12-09 ENCOUNTER — Ambulatory Visit: Admitting: General Practice

## 2023-12-09 VITALS — BP 118/62 | HR 69 | Temp 98.0°F | Ht 73.5 in | Wt 239.0 lb

## 2023-12-09 DIAGNOSIS — J449 Chronic obstructive pulmonary disease, unspecified: Secondary | ICD-10-CM

## 2023-12-09 DIAGNOSIS — I493 Ventricular premature depolarization: Secondary | ICD-10-CM | POA: Diagnosis not present

## 2023-12-09 DIAGNOSIS — N182 Chronic kidney disease, stage 2 (mild): Secondary | ICD-10-CM | POA: Diagnosis not present

## 2023-12-09 DIAGNOSIS — E785 Hyperlipidemia, unspecified: Secondary | ICD-10-CM

## 2023-12-09 DIAGNOSIS — E1169 Type 2 diabetes mellitus with other specified complication: Secondary | ICD-10-CM

## 2023-12-09 DIAGNOSIS — E1122 Type 2 diabetes mellitus with diabetic chronic kidney disease: Secondary | ICD-10-CM | POA: Diagnosis not present

## 2023-12-09 DIAGNOSIS — F419 Anxiety disorder, unspecified: Secondary | ICD-10-CM | POA: Diagnosis not present

## 2023-12-09 DIAGNOSIS — I10 Essential (primary) hypertension: Secondary | ICD-10-CM

## 2023-12-09 DIAGNOSIS — L97221 Non-pressure chronic ulcer of left calf limited to breakdown of skin: Secondary | ICD-10-CM

## 2023-12-09 DIAGNOSIS — Z7185 Encounter for immunization safety counseling: Secondary | ICD-10-CM | POA: Diagnosis not present

## 2023-12-09 MED ORDER — VERAPAMIL HCL ER 240 MG PO TBCR: EXTENDED_RELEASE_TABLET | ORAL | 2 refills | Status: AC

## 2023-12-09 MED ORDER — HYDROCHLOROTHIAZIDE 25 MG PO TABS: 12.5000 mg | ORAL_TABLET | Freq: Every day | ORAL | 1 refills | Status: AC

## 2023-12-09 MED ORDER — ROSUVASTATIN CALCIUM 40 MG PO TABS: ORAL_TABLET | ORAL | 2 refills | Status: AC

## 2023-12-09 NOTE — Progress Notes (Signed)
 Pharmacy Quality Measure Review  This patient is appearing on a report for being at risk of failing the adherence measure for diabetes medications this calendar year.   Medication: Mounjaro  7.5 mg Last fill date: 11/01/23 for 28 day supply  Insurance report was not up to date. No action needed at this time.  New Rx has been sent to pharmacy as of 11/27/23. Medication refilled as of 12/01/23 x28 ds.  Rosuvastatin  currently ready for pickup.

## 2023-12-09 NOTE — Progress Notes (Signed)
 Established Patient Office Visit  Subjective   Patient ID: [name], male    DOB: 1960-03-23  Age: 63 y.o. MRN: [id number]  Chief Complaint  Patient presents with   chronic care management    Patient here today for follow up on chronic conditions     HPI  [name] is a 63 year old with HTN, COPD, HLD, DM2, CKD, vitamin d  deficiency, smoker, right below the knee amputee presents today for chronic care management.   Discussed the use of AI scribe software for clinical note transcription with the patient, who gave verbal consent to proceed.  History of Present Illness [name] is a 63 year old male with diabetes who presents for a follow-up visit regarding his diabetes management and medication adjustments.  He has been managing his diabetes with Mounjaro  at a dose of 7.5 mg weekly, which has suppressed his appetite. He has not experienced nausea, vomiting, or diarrhea. Over the past three months, he has lost 9 pounds, with his weight decreasing from 248 pounds to 239 pounds. His diet now includes more water and diet sodas, and he is not eating as much as before. He has not experienced any recent hypoglycemic episodes and regularly checks his blood sugar levels, with recent readings around 100s-120s.He had a diabetic eye exam on November 17, 2023.   He discontinued metformin , and since then, the sores on his legs, which he had for six years, have resolved. Additionally, he no longer experiences cramps in his fingers, which he previously attributed to arthritis.  His current medications include citalopram  40 mg daily, hydrochlorothiazide  half a tablet daily, Crestor , lisinopril , pantoprazole  40 mg daily, and verapamil .  He is not taking any vaccines, including the pneumonia and shingles vaccines.  He recently had cataract surgery on one eye and is scheduled for surgery on the other eye in December.     Patient Active Problem List   Diagnosis Date Noted   Skin ulcer of  calf, left, limited to breakdown of skin ([name]) 12/09/2023   Vaccine counseling 12/09/2023   Frequent unifocal PVCs 12/09/2023   Left groin pain 11/13/2023   Injury of nail bed of toe 07/09/2023   Preoperative clearance 04/30/2023   Positive colorectal cancer screening using Cologuard test 04/23/2023   Anxiety 04/23/2023   Cerebral thrombosis with cerebral infarction 05/09/2021   Aphasia 05/08/2021   Leukocytosis 05/08/2021   Right below-knee amputee ([name]) 09/03/2019   Hyponatremia 08/26/2019   Cutaneous abscess of right foot    FHx: heart disease 10/08/2017   Type 2 diabetes mellitus with stage 2 chronic kidney disease, without long-term current use of insulin  ([name]) 10/08/2017   Insomnia 07/08/2017   Smoker 08/17/2015   COPD (chronic obstructive pulmonary disease) ([name]) 08/17/2015   CKD stage 2 due to type 2 diabetes mellitus ([name]) 08/31/2013   Medication management 05/19/2013   Essential hypertension    Hyperlipidemia associated with type 2 diabetes mellitus ([name])    Vitamin D  deficiency    History of kidney stones    Testosterone  deficiency    History of hepatitis C    Past Medical History:  Diagnosis Date   Diabetic neuropathy ([name])    Diverticulitis    GERD (gastroesophageal reflux disease)    History of hepatitis C    has been treated in the past   History of kidney stones    Hyperlipidemia    Hypertension    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[name])    Other testicular  hypofunction    Severe sepsis ([name]) 08/26/2019   Stroke ([name])    Type II or unspecified type diabetes mellitus without mention of complication, not stated as uncontrolled    Vitamin D  deficiency    Past Surgical History:  Procedure Laterality Date   AMPUTATION Right 09/01/2019   Procedure: RIGHT BELOW KNEE AMPUTATION;  Surgeon: Harden Jerona GAILS, MD;  Location: [facility] OR;  Service: Orthopedics;  Laterality: Right;   CHOLECYSTECTOMY  1987   COLONOSCOPY     I & D EXTREMITY Right 08/20/2019   Procedure: IRRIGATION  & DEBRIDEMENT RIGHT FOOT PARTIAL CALCANEAL EXCISION;  Surgeon: Harden Jerona GAILS, MD;  Location: [facility] OR;  Service: Orthopedics;  Laterality: Right;   I & D EXTREMITY Right 08/27/2019   Procedure: IRRIGATION AND DEBRIDEMENT  OF FOOT;  Surgeon: Jerri Kay HERO, MD;  Location: [facility] OR;  Service: Orthopedics;  Laterality: Right;   ORIF TIBIA FRACTURE     x 3 right leg   Allergies  Allergen Reactions   Invokana [Canagliflozin] Other (See Comments)    Extremity edema/caused pain   Codeine Camsylate [Codeine] Rash   Morphine And Codeine Rash         12/09/2023    8:54 AM 09/08/2023    8:53 AM 07/09/2023    8:55 AM  Depression screen PHQ 2/9  Decreased Interest 1 1 1   Down, Depressed, Hopeless 0 0 1  PHQ - 2 Score 1 1 2   Altered sleeping 0 1 1  Tired, decreased energy 1 1 1   Change in appetite 0 0 1  Feeling bad or failure about yourself  0 1 0  Trouble concentrating 0 1 2  Moving slowly or fidgety/restless 0 0 2  Suicidal thoughts 0 0 0  PHQ-9 Score 2 5  9    Difficult doing work/chores Not difficult at all Somewhat difficult Not difficult at all     Data saved with a previous flowsheet row definition       12/09/2023    8:54 AM 07/09/2023    8:56 AM 06/11/2023    8:39 AM 04/30/2023   11:03 AM  GAD 7 : Generalized Anxiety Score  Nervous, Anxious, on Edge 1 1 0 0  Control/stop worrying 0 1 1 1   Worry too much - different things 0 1 1 2   Trouble relaxing 0 0 1 1  Restless 0 0 0 1  Easily annoyed or irritable 1 1 2 2   Afraid - awful might happen 1 1 0 2  Total GAD 7 Score 3 5 5 9   Anxiety Difficulty Not difficult at all Somewhat difficult Somewhat difficult Somewhat difficult      Review of Systems  Constitutional:  Negative for chills and fever.  Respiratory:  Negative for shortness of breath.   Cardiovascular:  Negative for chest pain.  Gastrointestinal:  Negative for abdominal pain, constipation, diarrhea, heartburn, nausea and vomiting.  Genitourinary:  Negative for dysuria,  frequency and urgency.  Neurological:  Negative for dizziness and headaches.  Endo/Heme/Allergies:  Negative for polydipsia.  Psychiatric/Behavioral:  Negative for depression and suicidal ideas. The patient is not nervous/anxious.       Objective:     BP 118/62   Pulse 69   Temp 98 F (36.7 C) (Oral)   Ht 6' 1.5 (1.867 m)   Wt 239 lb (108.4 kg)   SpO2 98%   BMI 31.10 kg/m  BP Readings from Last 3 Encounters:  12/09/23 118/62  11/13/23 100/60  09/08/23 (!) 104/56  Wt Readings from Last 3 Encounters:  12/09/23 239 lb (108.4 kg)  11/13/23 244 lb 2 oz (110.7 kg)  09/08/23 248 lb (112.5 kg)      Physical Exam Vitals and nursing note reviewed.  Constitutional:      Appearance: Normal appearance.  Cardiovascular:     Rate and Rhythm: Normal rate and regular rhythm.     Pulses: Normal pulses.     Heart sounds: Normal heart sounds.  Pulmonary:     Effort: Pulmonary effort is normal.     Breath sounds: Normal breath sounds.  Neurological:     Mental Status: He is alert and oriented to person, place, and time.  Psychiatric:        Mood and Affect: Mood normal.        Behavior: Behavior normal.        Thought Content: Thought content normal.        Judgment: Judgment normal.      Results for orders placed or performed in visit on 12/09/23  HM DIABETES EYE EXAM  Result Value Ref Range   HM Diabetic Eye Exam No Retinopathy No Retinopathy       The ASCVD Risk score (Arnett DK, et al., 2019) failed to calculate for the following reasons:   Risk score cannot be calculated because patient has a medical history suggesting prior/existing ASCVD    Assessment & Plan:  Type 2 diabetes mellitus with stage 2 chronic kidney disease, without long-term current use of insulin  ([name])  Chronic obstructive pulmonary disease, unspecified COPD type ([name])  Essential hypertension -     hydroCHLOROthiazide ; Take 0.5 tablets (12.5 mg total) by mouth daily.  Dispense: 45 tablet;  Refill: 1  Hyperlipidemia associated with type 2 diabetes mellitus ([name]) -     Rosuvastatin  Calcium ; TAKE ONE TABLET BY MOUTH DAILY FOR CHOLESTEROL  Dispense: 90 tablet; Refill: 2  Frequent unifocal PVCs -     Verapamil  HCl ER; Take 1 tablet  Daily with a Meal for BP & Heart Rhythm                                                             /                                                                   TAKE                                         BY                                                 MOUTH  Dispense: 90 tablet; Refill: 2  Skin ulcer of calf, left, limited to breakdown of skin ([name]) Assessment & Plan: Improved after stopping metformin .    Vaccine counseling    Assessment  and Plan Assessment & Plan Type 2 diabetes mellitus with diabetic chronic kidney disease Diabetes well-controlled with Mounjaro . Stable blood sugars with occasional stress-related elevations. No hypoglycemia. Resolved skin ulcers and finger cramps after stopping metformin . - Continue Mounjaro  7.5 mg weekly. - Monitor blood sugars at home.. - Check A1c and other relevant labs at next visit. - Perform foot exam at next visit, including monofilament testing. - Have eye exam records sent to the office.  Chronic obstructive pulmonary disease (COPD) Continues to smoke heavily. Declined lung cancer screening and pneumonia vaccine. Discussed pneumonia risks due to smoking and diabetes. - Encouraged smoking cessation. - Declines lung cancer screening and pneumonia vaccine.   Essential hypertension Blood pressure well-controlled with current medication regimen. - Continue hydrochlorothiazide  and lisinopril . - Refilled hydrochlorothiazide .   Dyslipidemia Cholesterol well-controlled with Crestor . LDL is 29, HDL low. No exercise due to awaiting new prosthesis. - Continue Crestor . - Encouraged exercise once new prosthesis is obtained.  History of non-pressure chronic ulcer of left calf Chronic ulcers  resolved after stopping metformin .  Tobacco use disorder Continues to smoke heavily. Declined lung cancer screening and pneumonia vaccine. Discussed pneumonia risks due to smoking and diabetes. - Encouraged smoking cessation. - Discuss pneumonia vaccine again at next visit.   Return in about 3 months (around 03/10/2024) for 3 month chronic care management.[name] Carrol [place], NP

## 2023-12-09 NOTE — Assessment & Plan Note (Signed)
 Controlled.  Continue Celexa  40 mg once daily.

## 2023-12-09 NOTE — Assessment & Plan Note (Signed)
 Improved after stopping metformin .

## 2023-12-09 NOTE — Assessment & Plan Note (Signed)
 Declines pneumonia, influenza and shingles vaccine.

## 2023-12-09 NOTE — Patient Instructions (Addendum)
 Continue medications as prescribed.   Cut back on smoking as discussed.   F/u in 3 months with fasting labs. Please come fasting for four prior to appointment. You are allowed water and black coffee.   It was a pleasure to see you today!

## 2023-12-09 NOTE — Assessment & Plan Note (Signed)
 Controlled and chronic.  Continue Verapamil  240 mg once daily. Refill sent.

## 2023-12-10 ENCOUNTER — Other Ambulatory Visit: Payer: Self-pay | Admitting: General Practice

## 2023-12-10 DIAGNOSIS — I10 Essential (primary) hypertension: Secondary | ICD-10-CM

## 2023-12-10 NOTE — Progress Notes (Signed)
 Subjective:   Patient ID: [name], male   DOB: 63 y.o.   MRN: [id number]   HPI Patient presents concerned about injury to the fifth nailbed left and states it seems better but he wants it checked due to long-term diabetes.  Patient does not smoke currently and tries to be active   Review of Systems  All other systems reviewed and are negative.       Objective:  Physical Exam Vitals and nursing note reviewed.  Constitutional:      Appearance: He is well-developed.  Pulmonary:     Effort: Pulmonary effort is normal.  Musculoskeletal:        General: Normal range of motion.  Skin:    General: Skin is warm.  Neurological:     Mental Status: He is alert.     Neurovascular status was found to be moderately reduced but intact with range of motion adequate subtalar midtarsal joint.  Patient's fifth nail left was lost but there is a new nail growing under there is no erythema edema drainage attached to it     Assessment:  Probability for traumatized fifth nail with patient wanting to have it checked     Plan:  H&P reviewed and at this point I recommended just watching it and if it should start to drain turn red or become ingrown it will need to be deal with but since it is not hurting now I do not recommend any treatments

## 2023-12-24 ENCOUNTER — Telehealth: Payer: Self-pay

## 2023-12-24 NOTE — Telephone Encounter (Signed)
 Copied from CRM [id number]. Topic: General - Other >> Dec 24, 2023  4:22 PM Nessti S wrote: Reason for CRM: called in because she havent received the faxed letter. She would like letter for clearance of blood thinners refaxed to [phone]

## 2023-12-25 DIAGNOSIS — H25812 Combined forms of age-related cataract, left eye: Secondary | ICD-10-CM | POA: Diagnosis not present

## 2023-12-25 DIAGNOSIS — I10 Essential (primary) hypertension: Secondary | ICD-10-CM | POA: Diagnosis not present

## 2023-12-25 DIAGNOSIS — H2512 Age-related nuclear cataract, left eye: Secondary | ICD-10-CM | POA: Diagnosis not present

## 2023-12-25 NOTE — Telephone Encounter (Signed)
 Letter has been refaxed to number provided and received confirmation fax that everything went thru.

## 2023-12-26 ENCOUNTER — Telehealth: Payer: Self-pay

## 2023-12-26 ENCOUNTER — Encounter: Payer: Self-pay | Admitting: Pharmacist

## 2023-12-26 NOTE — Progress Notes (Signed)
 Pharmacy Quality Measure Review  This patient is appearing on a report for being at risk of failing the adherence measure for hypertension (ACEi/ARB) medications this calendar year.   Medication: Lisinopril   Last fill date: 12/10/23 for 90 day supply  Insurance report was not up to date. No action needed at this time.   Darrelyn Drum, PharmD, BCPS, CPP Clinical Pharmacist Practitioner [facility] [phone]

## 2023-12-26 NOTE — Telephone Encounter (Signed)
 [facility] is asking if you will add pt's K3 level in his 11/06/23 note so they can process his Rx.

## 2024-01-11 ENCOUNTER — Other Ambulatory Visit: Payer: Self-pay | Admitting: General Practice

## 2024-01-11 DIAGNOSIS — I6330 Cerebral infarction due to thrombosis of unspecified cerebral artery: Secondary | ICD-10-CM

## 2024-01-23 ENCOUNTER — Other Ambulatory Visit: Payer: Self-pay | Admitting: General Practice

## 2024-01-23 DIAGNOSIS — E1122 Type 2 diabetes mellitus with diabetic chronic kidney disease: Secondary | ICD-10-CM

## 2024-02-24 ENCOUNTER — Telehealth: Payer: Self-pay

## 2024-02-24 ENCOUNTER — Other Ambulatory Visit (HOSPITAL_COMMUNITY): Payer: Self-pay

## 2024-02-24 MED ORDER — ACCU-CHEK GUIDE W/DEVICE KIT: PACK | 0 refills | Status: AC

## 2024-02-24 MED ORDER — ACCU-CHEK GUIDE TEST VI STRP: ORAL_STRIP | 12 refills | Status: AC

## 2024-02-24 NOTE — Telephone Encounter (Signed)
*  Primary  Pharmacy Patient Advocate Encounter   Received notification from Fax that prior authorization for OneTouch Verio strips   is required/requested.   Insurance verification completed.   The patient is insured through [facility] ADVANTAGE/RX ADVANCE.   Per test claim:  See Below:  is preferred by the insurance.  If suggested medication is appropriate, Please send in a new RX and discontinue this one. If not, please advise as to why it's not appropriate so that we may request a Prior Authorization. Please note, some preferred medications may still require a PA.  If the suggested medications have not been trialed and there are no contraindications to their use, the PA will not be submitted, as it will not be approved. Archived Key: [id number]   Please use preferred diabetic supplies  such as [employer]-chek [employer] Contour for BGM and  [employer] for CGM.

## 2024-03-10 ENCOUNTER — Ambulatory Visit: Admitting: General Practice

## 2024-03-12 ENCOUNTER — Ambulatory Visit
